# Patient Record
Sex: Male | Born: 1950
Health system: Southern US, Community
[De-identification: ages and names within clinical notes are randomized; demographics above are authoritative.]

## PROBLEM LIST (undated history)

## (undated) DIAGNOSIS — J810 Acute pulmonary edema: Secondary | ICD-10-CM

## (undated) DIAGNOSIS — I214 Non-ST elevation (NSTEMI) myocardial infarction: Principal | ICD-10-CM

## (undated) DIAGNOSIS — I2510 Atherosclerotic heart disease of native coronary artery without angina pectoris: Secondary | ICD-10-CM

## (undated) DIAGNOSIS — I5042 Chronic combined systolic (congestive) and diastolic (congestive) heart failure: Secondary | ICD-10-CM

## (undated) DIAGNOSIS — E785 Hyperlipidemia, unspecified: Secondary | ICD-10-CM

## (undated) DIAGNOSIS — I6529 Occlusion and stenosis of unspecified carotid artery: Secondary | ICD-10-CM

## (undated) DIAGNOSIS — I5022 Chronic systolic (congestive) heart failure: Secondary | ICD-10-CM

## (undated) DIAGNOSIS — I499 Cardiac arrhythmia, unspecified: Secondary | ICD-10-CM

## (undated) DIAGNOSIS — I255 Ischemic cardiomyopathy: Secondary | ICD-10-CM

## (undated) DIAGNOSIS — I10 Essential (primary) hypertension: Secondary | ICD-10-CM

## (undated) HISTORY — DX: Occlusion and stenosis of unspecified carotid artery: I65.29

## (undated) HISTORY — DX: Cardiac arrhythmia, unspecified: I49.9

## (undated) HISTORY — PX: CARDIAC CATHETERIZATION: SHX172

---

## 2002-01-12 ENCOUNTER — Encounter: Admission: RE | Admit: 2002-01-12 | Discharge: 2002-04-12 | Payer: Self-pay | Admitting: Internal Medicine

## 2003-01-04 ENCOUNTER — Encounter: Admission: RE | Admit: 2003-01-04 | Discharge: 2003-01-04 | Payer: Self-pay | Admitting: Occupational Medicine

## 2003-02-11 ENCOUNTER — Encounter: Admission: RE | Admit: 2003-02-11 | Discharge: 2003-03-03 | Payer: Self-pay | Admitting: Occupational Medicine

## 2009-02-23 ENCOUNTER — Emergency Department (HOSPITAL_COMMUNITY): Admission: EM | Admit: 2009-02-23 | Discharge: 2009-02-23 | Payer: Self-pay | Admitting: Family Medicine

## 2010-01-21 ENCOUNTER — Inpatient Hospital Stay (HOSPITAL_COMMUNITY)
Admission: EM | Admit: 2010-01-21 | Discharge: 2010-01-25 | Payer: Self-pay | Source: Home / Self Care | Attending: Cardiovascular Disease | Admitting: Cardiovascular Disease

## 2010-02-09 ENCOUNTER — Encounter: Payer: Self-pay | Admitting: Cardiovascular Disease

## 2010-02-10 ENCOUNTER — Ambulatory Visit: Payer: Self-pay | Admitting: Cardiovascular Disease

## 2010-03-07 ENCOUNTER — Ambulatory Visit: Payer: Self-pay | Admitting: Cardiovascular Disease

## 2010-03-07 ENCOUNTER — Encounter: Payer: Self-pay | Admitting: Cardiovascular Disease

## 2010-03-23 NOTE — Progress Notes (Signed)
Summary: [facility]: Progress Note  [facility]: Progress Note   Imported By: Earl Many 03/15/2010 17:54:32  _____________________________________________________________________  External Attachment:    Type:   Image     Comment:   External Document

## 2010-03-29 NOTE — Letter (Signed)
Summary: [facility] Progress Note   [facility] Progress Note   Imported By: Roderic Ovens 03/23/2010 12:02:37  _____________________________________________________________________  External Attachment:    Type:   Image     Comment:   External Document

## 2010-04-17 LAB — POCT CARDIAC MARKERS
CKMB, poc: 6.7 ng/mL (ref 1.0–8.0)
Myoglobin, poc: 118 ng/mL (ref 12–200)
Troponin i, poc: 2.45 ng/mL (ref 0.00–0.09)

## 2010-04-17 LAB — BASIC METABOLIC PANEL
BUN: 16 mg/dL (ref 6–23)
CO2: 26 mEq/L (ref 19–32)
Calcium: 8.4 mg/dL (ref 8.4–10.5)
Chloride: 103 mEq/L (ref 96–112)
Creatinine, Ser: 0.89 mg/dL (ref 0.4–1.5)
GFR calc Af Amer: >60 mL/min (ref >60)
GFR calc non Af Amer: >60 mL/min (ref >60)
Glucose, Bld: 239 mg/dL — ABNORMAL HIGH (ref 70–99)
Potassium: 4.2 mEq/L (ref 3.5–5.1)
Sodium: 135 mEq/L (ref 135–145)

## 2010-04-17 LAB — LIPID PANEL
Cholesterol: 183 mg/dL (ref 0–200)
HDL: 32 mg/dL — ABNORMAL LOW (ref >39)
LDL Cholesterol: 137 mg/dL — ABNORMAL HIGH (ref 0–99)
Total CHOL/HDL Ratio: 5.7 RATIO
Triglycerides: 68 mg/dL (ref <150)
VLDL: 14 mg/dL (ref 0–40)

## 2010-04-17 LAB — COMPREHENSIVE METABOLIC PANEL
ALT: 21 U/L (ref 0–53)
AST: 25 U/L (ref 0–37)
Albumin: 3.5 g/dL (ref 3.5–5.2)
Alkaline Phosphatase: 68 U/L (ref 39–117)
BUN: 20 mg/dL (ref 6–23)
CO2: 24 mEq/L (ref 19–32)
Calcium: 9.4 mg/dL (ref 8.4–10.5)
Chloride: 102 mEq/L (ref 96–112)
Creatinine, Ser: 0.95 mg/dL (ref 0.4–1.5)
GFR calc Af Amer: >60 mL/min (ref >60)
GFR calc non Af Amer: >60 mL/min (ref >60)
Glucose, Bld: 352 mg/dL — ABNORMAL HIGH (ref 70–99)
Potassium: 4.1 mEq/L (ref 3.5–5.1)
Sodium: 136 mEq/L (ref 135–145)
Total Bilirubin: 0.4 mg/dL (ref 0.3–1.2)
Total Protein: 6.7 g/dL (ref 6.0–8.3)

## 2010-04-17 LAB — DIFFERENTIAL
Basophils Absolute: 0 10*3/uL (ref 0.0–0.1)
Basophils Relative: 0 % (ref 0–1)
Eosinophils Absolute: 0.2 10*3/uL (ref 0.0–0.7)
Eosinophils Relative: 3 % (ref 0–5)
Lymphocytes Relative: 21 % (ref 12–46)
Lymphs Abs: 1.4 10*3/uL (ref 0.7–4.0)
Monocytes Absolute: 0.4 10*3/uL (ref 0.1–1.0)
Monocytes Relative: 5 % (ref 3–12)
Neutro Abs: 4.9 10*3/uL (ref 1.7–7.7)
Neutrophils Relative %: 71 % (ref 43–77)

## 2010-04-17 LAB — URINALYSIS, ROUTINE W REFLEX MICROSCOPIC
Bilirubin Urine: NEGATIVE
Glucose, UA: >1000 mg/dL — AB
Hgb urine dipstick: NEGATIVE
Ketones, ur: NEGATIVE mg/dL
Leukocytes, UA: NEGATIVE
Nitrite: NEGATIVE
Protein, ur: NEGATIVE mg/dL
Specific Gravity, Urine: 1.037 — ABNORMAL HIGH (ref 1.005–1.030)
Urobilinogen, UA: 0.2 mg/dL (ref 0.0–1.0)
pH: 6.5 (ref 5.0–8.0)

## 2010-04-17 LAB — GLUCOSE, CAPILLARY
Glucose-Capillary: 128 mg/dL — ABNORMAL HIGH (ref 70–99)
Glucose-Capillary: 151 mg/dL — ABNORMAL HIGH (ref 70–99)
Glucose-Capillary: 179 mg/dL — ABNORMAL HIGH (ref 70–99)
Glucose-Capillary: 182 mg/dL — ABNORMAL HIGH (ref 70–99)
Glucose-Capillary: 187 mg/dL — ABNORMAL HIGH (ref 70–99)
Glucose-Capillary: 195 mg/dL — ABNORMAL HIGH (ref 70–99)
Glucose-Capillary: 269 mg/dL — ABNORMAL HIGH (ref 70–99)
Glucose-Capillary: 397 mg/dL — ABNORMAL HIGH (ref 70–99)

## 2010-04-17 LAB — CBC
HCT: 40 % (ref 39.0–52.0)
HCT: 40.5 % (ref 39.0–52.0)
Hemoglobin: 13.4 g/dL (ref 13.0–17.0)
Hemoglobin: 14.2 g/dL (ref 13.0–17.0)
MCH: 30.5 pg (ref 26.0–34.0)
MCH: 31.6 pg (ref 26.0–34.0)
MCHC: 33.5 g/dL (ref 30.0–36.0)
MCHC: 35.1 g/dL (ref 30.0–36.0)
MCV: 90.2 fL (ref 78.0–100.0)
MCV: 90.9 fL (ref 78.0–100.0)
Platelets: 175 10*3/uL (ref 150–400)
Platelets: 190 10*3/uL (ref 150–400)
RBC: 4.4 MIL/uL (ref 4.22–5.81)
RBC: 4.49 MIL/uL (ref 4.22–5.81)
RDW: 12.5 % (ref 11.5–15.5)
RDW: 12.5 % (ref 11.5–15.5)
WBC: 6.9 10*3/uL (ref 4.0–10.5)
WBC: 8.1 10*3/uL (ref 4.0–10.5)

## 2010-04-17 LAB — URINE MICROSCOPIC-ADD ON: Urine-Other: NONE SEEN

## 2010-04-17 LAB — TSH: TSH: 3.058 u[IU]/mL (ref 0.350–4.500)

## 2010-04-17 LAB — URINE CULTURE
Colony Count: NO GROWTH
Culture  Setup Time: 201112171834
Culture: NO GROWTH

## 2010-04-17 LAB — PROTIME-INR
INR: 0.93 (ref 0.00–1.49)
Prothrombin Time: 12.7 seconds (ref 11.6–15.2)

## 2010-04-17 LAB — CARDIAC PANEL(CRET KIN+CKTOT+MB+TROPI)
CK, MB: 17.1 ng/mL (ref 0.3–4.0)
CK, MB: 24.1 ng/mL (ref 0.3–4.0)
Relative Index: 5.5 — ABNORMAL HIGH (ref 0.0–2.5)
Relative Index: 5.9 — ABNORMAL HIGH (ref 0.0–2.5)
Total CK: 312 U/L — ABNORMAL HIGH (ref 7–232)
Total CK: 407 U/L — ABNORMAL HIGH (ref 7–232)
Troponin I: 12.43 ng/mL (ref 0.00–0.06)
Troponin I: 15.69 ng/mL (ref 0.00–0.06)

## 2010-04-17 LAB — APTT: aPTT: 30 seconds (ref 24–37)

## 2010-04-17 LAB — TROPONIN I: Troponin I: 3.19 ng/mL (ref 0.00–0.06)

## 2010-04-17 LAB — CK TOTAL AND CKMB (NOT AT ARMC)
CK, MB: 7.6 ng/mL (ref 0.3–4.0)
Relative Index: 4.5 — ABNORMAL HIGH (ref 0.0–2.5)
Total CK: 169 U/L (ref 7–232)

## 2010-04-17 LAB — LIPASE, BLOOD: Lipase: 28 U/L (ref 11–59)

## 2010-04-17 LAB — MRSA PCR SCREENING: MRSA by PCR: NEGATIVE

## 2010-04-19 ENCOUNTER — Other Ambulatory Visit (INDEPENDENT_AMBULATORY_CARE_PROVIDER_SITE_OTHER): Payer: 59

## 2010-04-19 DIAGNOSIS — E785 Hyperlipidemia, unspecified: Secondary | ICD-10-CM

## 2010-04-21 ENCOUNTER — Observation Stay (HOSPITAL_COMMUNITY)
Admission: RE | Admit: 2010-04-21 | Discharge: 2010-04-22 | Disposition: A | Discharge status: Home / Self Care | Payer: 59 | Source: Ambulatory Visit | Attending: Cardiovascular Disease | Admitting: Cardiovascular Disease

## 2010-04-21 DIAGNOSIS — E785 Hyperlipidemia, unspecified: Secondary | ICD-10-CM | POA: Insufficient documentation

## 2010-04-21 DIAGNOSIS — I2510 Atherosclerotic heart disease of native coronary artery without angina pectoris: Secondary | ICD-10-CM

## 2010-04-21 DIAGNOSIS — F17200 Nicotine dependence, unspecified, uncomplicated: Secondary | ICD-10-CM | POA: Insufficient documentation

## 2010-04-21 DIAGNOSIS — I498 Other specified cardiac arrhythmias: Secondary | ICD-10-CM | POA: Insufficient documentation

## 2010-04-21 DIAGNOSIS — I209 Angina pectoris, unspecified: Secondary | ICD-10-CM | POA: Insufficient documentation

## 2010-04-21 DIAGNOSIS — I252 Old myocardial infarction: Secondary | ICD-10-CM | POA: Insufficient documentation

## 2010-04-21 DIAGNOSIS — E119 Type 2 diabetes mellitus without complications: Secondary | ICD-10-CM | POA: Insufficient documentation

## 2010-04-21 LAB — GLUCOSE, CAPILLARY: Glucose-Capillary: 315 mg/dL — ABNORMAL HIGH (ref 70–99)

## 2010-04-22 DIAGNOSIS — R079 Chest pain, unspecified: Secondary | ICD-10-CM

## 2010-04-22 LAB — CBC
MCH: 29.8 pg (ref 26.0–34.0)
MCHC: 33 g/dL (ref 30.0–36.0)
MCV: 90.4 fL (ref 78.0–100.0)
Platelets: 194 10*3/uL (ref 150–400)
RDW: 12.8 % (ref 11.5–15.5)
WBC: 6.4 10*3/uL (ref 4.0–10.5)

## 2010-04-22 LAB — BASIC METABOLIC PANEL
BUN: 17 mg/dL (ref 6–23)
Chloride: 107 mEq/L (ref 96–112)
Creatinine, Ser: 0.91 mg/dL (ref 0.4–1.5)
Glucose, Bld: 254 mg/dL — ABNORMAL HIGH (ref 70–99)

## 2010-04-23 NOTE — Discharge Summary (Addendum)
NAMEWEN, MERCED                  ACCOUNT NO.:  [id number]  MEDICAL RECORD NO.:  [id number]           PATIENT TYPE:  O  LOCATION:  6526                         FACILITY:  [facility]  PHYSICIAN:  Vesta Mixer, M.D. DATE OF BIRTH:  [birth year]  DATE OF ADMISSION:  04/21/2010 DATE OF DISCHARGE:  04/22/2010                              DISCHARGE SUMMARY   DISCHARGE DIAGNOSES: 1. Persistent chest pain, status post right coronary artery stenting     with overlapping long drug-eluting stent placed on April 21, 2010     (initially treated medically from December 2011 cath, failed     medical therapy). 2. Coronary artery disease initially diagnosed by catheterization,     January 21, 2010, in the setting of non-ST elevation myocardial     infarction.     a.     Status post percutaneous coronary intervention using Promus      drug-eluting stent placement to the left circumflex, January 21, 2010, patent by cath April 21, 2010.     b.     Right coronary artery stenosis diagnosed that admission,      initially from medical therapy with above intervention this      admission.     c.     Ejection fraction of 50% by cath, December of 17, 2011. 3. Diabetes mellitus, for followup with primary care physician. 4. Dyslipidemia. 5. Tobacco abuse. 6. Sinus bradycardia, previous documented sinus pauses, asymptomatic.  HOSPITAL COURSE:  Mr. Arlotta is a 60 year old gentleman with the past medical history that includes diabetes, tobacco abuse, dyslipidemia, and newly diagnosed coronary artery disease by cath in December 2011.  He had a PCI with drug-eluting stent placement to the left circumflex at time and had severe RCA stenosis involving diffusely diseased vessels left-to-right collaterals.  Initially, medical management was pursued. As the vessel was not felt favorable for PCI, but could be considered given any refractory angina.  The patient presented to Dr. Harvie Bridge office with continued  complaints of ongoing chest pain, exertional at times, others signs of indigestion, and mild tightness when he walks out of the car.  Because of these recurrent symptoms, he was referred for repeat cardiac catheterization which was done on April 21, 2010, by Dr. Excell Seltzer.  He ultimately had successful stenting to RCA with overlapping long drug-eluting stents.  There was negative pressure wire analysis of the LAD as well.  The patient did well postprocedurally.  He was seen and examined by Dr. Elease Hashimoto and will be discharged in stable condition to home.  DISCHARGE LABS:  WBC 6.4, hemoglobin 13.1, hematocrit 39.7, platelet count 194.  Sodium 136, potassium 4.3, chloride 107, CO2 of 25, glucose 254, BUN 17, creatinine 0.91.  STUDIES:  Cardiac catheterization on April 21, 2010, please see full report for details as well as HPI for summary.  DISCHARGE MEDICATIONS: 1. Aspirin 81 mg daily. 2. Crestor 20 mg at bedtime. 3. Metformin 1000 mg b.i.d. with instructions not to restart until     April 24, 2010. 4. Metoprolol 25 mg b.i.d. 5. Nitroglycerin sublingual 0.4 mg every  5 minutes as needed up to 3     doses. 6. Effient 10 mg daily. 7. Zantac 75 mg b.i.d. p.r.n. for acid reflux.  DISPOSITION:  Mr. Sindelar will be discharged in stable condition to home. He is instructed not to lift anything or participate in sexual activity for 1 week.  He is not to drive 2 days.  Follow a heart-healthy diabetic diet.  If he notices any pain, swelling, bleeding, or pus at the cath site, he is to call or return.  He will follow up by Dr. Elease Hashimoto in approximately 2 weeks in our office.  We will call him with this appointment.  DURATION OF DISCHARGE ENCOUNTER:  Greater than 30 minutes including physician and PA time.     Ronie Spies, P.A.C.   ______________________________ Vesta Mixer, M.D.    DD/MEDQ  D:  04/22/2010  T:  04/22/2010  Job:  [id number]  Electronically Signed by Ronie Spies  on 04/23/2010  01:36:21 PM Electronically Signed by Kristeen Miss M.D. on 05/16/2010 09:07:00 AM

## 2010-04-24 LAB — GLUCOSE, CAPILLARY: Glucose-Capillary: 266 mg/dL — ABNORMAL HIGH (ref 70–99)

## 2010-04-25 NOTE — Procedures (Signed)
NAMEALMA, Colin [name]                  ACCOUNT NO.:  [id number]  MEDICAL RECORD NO.:  [id number]           PATIENT TYPE:  O  LOCATION:  6526                         FACILITY:  [facility]  PHYSICIAN:  Veverly Fells. Excell Seltzer, MD  DATE OF BIRTH:  08-31-1950  DATE OF PROCEDURE:  04/21/2010 DATE OF DISCHARGE:                           CARDIAC CATHETERIZATION   PROCEDURES: 1. Selective coronary angiography. 2. PTCA and stenting right coronary artery. 3. Pressure wire analysis of the LAD. 4. Perclose of the right femoral artery.  PROCEDURAL INDICATION:  Mr. Girardin is a 60 year old gentleman who presented with a non-ST-elevation infarction a few months back.  He has had refractory angina.  He had a subtotal right coronary artery that was diffusely diseased and was not very favorable for PCI initially.  He did undergo PCI of the left circumflex.  The patient has continued to have angina worse in cold weather and after discussion with Dr. Elease Hashimoto, we elected to bring him back for repeat angiography and probable PCI in the right coronary artery.  Risks and indications of procedure reviewed with the patient.  Informed consent was obtained.  A 6-French sheath was placed in right femoral artery.  A 6-French JL-4 diagnostic catheter was inserted and angiography.  The left coronary artery was performed.  A 6-French JR-4 guide catheter was then inserted and angiography.  The right coronary artery was performed.  This demonstrated subtotal occlusion of the mid- right coronary artery with TIMI-2 flow.  There really was not much left- to-right collateral identified.  We elected to proceed with PCI. Bivalve ring was started for anticoagulation.  A Cougar wire was used to cross the lesion and it was crossed without difficulty.  The vessel was predilated with a 2.5 x 15-mm Apex balloon.  After reopening the vessel, there clearly was severe diffuse stenosis and thought we had to treat the entire distal right coronary  artery which had long diffuse severe stenosis.  A 2.5 x 33 mm Xience Prime stent was carefully positioned over the distal RCA and was deployed to 12 atmospheres the stent appeared well expanded.  A 3.0 x 38 mm Xience Prime drug-eluting stent was then deployed in overlapping fashion over the proximal and mid RCA. It was deployed at 14 atmospheres.  Both stents appeared well expanded. The distal stent was postdilated with a 2.75 x 20-mm [place] Quantum Apex balloon which was dilated to a maximum pressure of 18 atmospheres over the distal and mid stented segment.  A 3.25 x 20-mm  TREK balloon was used over the mid and proximal stent and it was taken to 18 atmospheres on three inflations.  There was an excellent angiographic result with TIMI-3 flow and 0% residual stenosis throughout the RCA.  Attention was then turned to the LAD.  There was moderate stenosis of the proximal LAD just beyond the first diagonal branch.  I decided to do pressure wire analysis of this lesion.  The pressure wire was advanced easily into the mid-LAD beyond the area of stenosis.  There was no resting gradient present.  Intravenous adenosine was then administered and the FFR at  peak hyperemia was 0.92.  The recorded FFR was 0.95, but we saw drift as low as 0.92.  The patient tolerated the procedure well.  There were no immediate complications.  A Perclose device was used for femoral hemostasis.  Coronary findings:  The left main is patent, divides into the LAD and left circumflex.  LAD.  The LAD is patent throughout its course.  It supplies a moderate first diagonal with no stenosis.  Just after the first diagonal, there was a 60-70% stenosis present.  Further down in the mid and distal LAD, there is no significant stenosis.  Left circumflex is patent.  There is a widely patent stent in the mid circumflex.  There are three OM branches all of which are patent.  The first OM arises from the midportion of the stented  segment and it has ostial narrowing related to the stent, but it fills normally.  Right coronary artery.  The right coronary artery has severe stenosis in the midportion.  There is moderate diffuse proximal stenosis.  The midportion is subtotally occluded with a filling defect concerning for thrombus and ruptured plaque.  There is long segment diffuse stenosis throughout the mid and distal vessel.  The PDA and PL branches filled with TIMI 2 flow and are not well filled.  FINAL CONCLUSIONS: 1. Successful stenting of the right coronary artery with overlapping     long drug-eluting stents. 2. Negative pressure wire analysis of the LAD.  RECOMMENDATIONS:  The patient should continue on long-term dual antiplatelet therapy with aspirin and Effient.  He will be followed by Dr. Elease Hashimoto.     Veverly Fells. Excell Seltzer, MD     MDC/MEDQ  D:  04/21/2010  T:  04/22/2010  Job:  [id number]  cc:   Vesta Mixer, M.D.  Electronically Signed by Tonny Bollman MD on 04/25/2010 06:03:50 PM

## 2010-04-26 ENCOUNTER — Ambulatory Visit (INDEPENDENT_AMBULATORY_CARE_PROVIDER_SITE_OTHER): Payer: 59 | Admitting: Cardiovascular Disease

## 2010-04-26 ENCOUNTER — Encounter: Payer: Self-pay | Admitting: *Deleted

## 2010-04-26 ENCOUNTER — Encounter: Payer: Self-pay | Admitting: Cardiovascular Disease

## 2010-04-26 ENCOUNTER — Ambulatory Visit (INDEPENDENT_AMBULATORY_CARE_PROVIDER_SITE_OTHER): Payer: 59 | Admitting: *Deleted

## 2010-04-26 VITALS — BP 142/80 | HR 72 | Wt 153.0 lb

## 2010-04-26 DIAGNOSIS — E785 Hyperlipidemia, unspecified: Secondary | ICD-10-CM

## 2010-04-26 DIAGNOSIS — E119 Type 2 diabetes mellitus without complications: Secondary | ICD-10-CM

## 2010-04-26 DIAGNOSIS — E1165 Type 2 diabetes mellitus with hyperglycemia: Secondary | ICD-10-CM | POA: Insufficient documentation

## 2010-04-26 DIAGNOSIS — I2510 Atherosclerotic heart disease of native coronary artery without angina pectoris: Secondary | ICD-10-CM | POA: Insufficient documentation

## 2010-04-26 DIAGNOSIS — E1169 Type 2 diabetes mellitus with other specified complication: Secondary | ICD-10-CM | POA: Insufficient documentation

## 2010-04-26 DIAGNOSIS — I25110 Atherosclerotic heart disease of native coronary artery with unstable angina pectoris: Secondary | ICD-10-CM | POA: Insufficient documentation

## 2010-04-26 NOTE — Assessment & Plan Note (Addendum)
Colin [name] seems to be doing quite a bit better. He has episodes of chest pain but these are clearly noncardiac. These episodes only last 1-2 seconds. I'm pleased is not having any further episodes of angina. We will continue with his same medications.I'll see him back in 3 months for followup visit. I've asked him to call me sooner if needed.  Have talked to be HR director at his work. He is to return to work on May 01, 2010.

## 2010-04-26 NOTE — Assessment & Plan Note (Signed)
His diabetes appears to be stable. He will follow up with Dr. Sharl Ma

## 2010-04-26 NOTE — Assessment & Plan Note (Signed)
His diabetes has been stable. This is been managed by Dr.Kerr.

## 2010-04-26 NOTE — Assessment & Plan Note (Signed)
He will continue on the Crestor. We'll check a lipid profile and complete metabolic profile at his next visit in 3 months.

## 2010-04-26 NOTE — Progress Notes (Deleted)
Subjective: Colin [name] presents for follow up Of his chest pain. 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 He had PTCA and stenting of the left circumflex artery at that time. His right coronary artery was subtotally occluded it was thought not to be a good candidate for PCI.  He continued have chest pain and was admitted last week for PCI the right coronary artery. He underwent successful PTCA and stenting of his proximal and mid right coronary artery with good results.  He has continued have episodes of chest pain but these are rather atypical. He complains of very quick chest pains that last only one or 2 seconds. They're not associated with exertion. He has not been back to work. He denies any syncope or presyncope. He denies any PND or orthopnea.  Current Outpatient Prescriptions  Medication Sig Dispense Refill  . aspirin 81 MG tablet Take 81 mg by mouth daily.        . isosorbide mononitrate (IMDUR) 30 MG 24 hr tablet Take 30 mg by mouth daily.        . metoprolol succinate (TOPROL-XL) 25 MG 24 hr tablet Take 25 mg by mouth 2 (two) times daily.        . prasugrel (EFFIENT) 10 MG TABS Take 10 mg by mouth daily.        . ranitidine (ZANTAC) 150 MG tablet Take 150 mg by mouth as needed.        . rosuvastatin (CRESTOR) 20 MG tablet Take 20 mg by mouth daily.          No Known Allergies  There is no problem list on file for this patient.   History  Smoking status  . Current Everyday Smoker -- 0.2 packs/day  . Types: Cigarettes  Smokeless tobacco  . Not on file    History  Alcohol Use No    History reviewed. No pertinent family history.  Review of Systems: The patient denies any heat or cold intolerance.  No weight gain or weight loss.  The patient denies headaches or blurry vision.  There is no cough or sputum production.  The patient denies dizziness.  There is no hematuria or hematochezia.  The patient denies any muscle aches or  arthritis.  The patient denies any rash.  The patient denies frequent falling or instability.  There is no history of depression or anxiety.  All other systems were reviewed and are negative.   Physical Exam:  Assessment / Plan:

## 2010-04-26 NOTE — Assessment & Plan Note (Signed)
Colin [name] seems to be doing quite well. He has had episodes of chest pain but these are clearly noncardiac. These episodes only last for one to 2 seconds. I'm pleased that he is not having any further episodes of angina. We'll continue with his same medications. I'll see him back in a the office in 3 months. I have asked him to call [facility] if needed. I've talked to the HR director at his workplace. He is to return to work on May 01, 2010.

## 2010-04-26 NOTE — Progress Notes (Signed)
Subjective:  Mr. Costilla presents for follow up Of his chest pain. 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 He had PTCA and stenting of the left circumflex artery at that time. His right coronary artery was subtotally occluded it was thought not to be a good candidate for PCI. He continued have chest pain and was admitted last week for PCI the right coronary artery. He underwent successful PTCA and stenting of his proximal and mid right coronary artery with good results.  He has continued have episodes of chest pain but these are rather atypical. He complains of very quick chest pains that last only one or 2 seconds. They're not associated with exertion. He has not been back to work. He denies any syncope or presyncope. He denies any PND or orthopnea.   Current Outpatient Prescriptions Medication Sig Dispense Refill . aspirin 81 MG tablet Take 81 mg by mouth daily.       . isosorbide mononitrate (IMDUR) 30 MG 24 hr tablet Take 30 mg by mouth daily.       . metoprolol succinate (TOPROL-XL) 25 MG 24 hr tablet Take 25 mg by mouth 2 (two) times daily.       . prasugrel (EFFIENT) 10 MG TABS Take 10 mg by mouth daily.       . ranitidine (ZANTAC) 150 MG tablet Take 150 mg by mouth as needed.       . rosuvastatin (CRESTOR) 20 MG tablet Take 20 mg by mouth daily.         No Known Allergies  Patient Active Problem List Diagnoses . Coronary artery disease . Diabetes mellitus . Hyperlipidemia   History Smoking status . Current Everyday Smoker -- 0.2 packs/day . Types: Cigarettes Smokeless tobacco . Not on file   History Alcohol Use No   History reviewed. No pertinent family history.  Review of Systems: The patient denies any heat or cold intolerance.  No weight gain or weight loss.  The patient denies headaches or blurry vision.  There is no cough or sputum production.  The patient denies dizziness.  There is no hematuria or hematochezia.  The  patient denies any muscle aches or arthritis.  The patient denies any rash.  The patient denies frequent falling or instability.  There is no history of depression or anxiety.  All other systems were reviewed and are negative.   Physical Exam: The patient is alert and oriented x 3.  The mood and affect are normal.  The skin is warm and dry.  Color is normal.  The HEENT exam reveals that the sclera are nonicteric.  The mucous membranes are moist.  The carotids are 2+ without bruits.  There is no thyromegaly.  There is no JVD.  The lungs are clear.  The chest wall is non tender.  The heart exam reveals a regular rate with a normal S1 and S2.  There are no murmurs, gallops, or rubs.  The PMI is not displaced.   Abdominal exam reveals good bowel sounds.  There is no guarding or rebound.  There is no hepatosplenomegaly or tenderness.  There are no masses.  Exam of the legs reveal no clubbing, cyanosis, or edema.  The legs are without rashes.  The distal pulses are intact.  Cranial nerves II - XII are intact.  Motor and sensory functions are intact.  The gait is normal.  Assessment / Plan:

## 2010-04-26 NOTE — Assessment & Plan Note (Signed)
He will continue with the same dose of Crestor. We will check a fasting lipid profile and CMET at his next office visit in 3 months.

## 2010-04-26 NOTE — Progress Notes (Signed)
Subjective:  [name] presents for follow up Of his chest pain. 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 He had PTCA and stenting of the left circumflex artery at that time. His right coronary artery was subtotally occluded it was thought not to be a good candidate for PCI. He continued have chest pain and was admitted last week for PCI the right coronary artery. He underwent successful PTCA and stenting of his proximal and mid right coronary artery with good results.  He has continued have episodes of chest pain but these are rather atypical. He complains of very quick chest pains that last only one or 2 seconds. They're not associated with exertion. He has not been back to work. He denies any syncope or presyncope. He denies any PND or orthopnea.   Current Outpatient Prescriptions Medication Sig Dispense Refill . aspirin 81 MG tablet Take 81 mg by mouth daily.       . isosorbide mononitrate (IMDUR) 30 MG 24 hr tablet Take 30 mg by mouth daily.       . metoprolol succinate (TOPROL-XL) 25 MG 24 hr tablet Take 25 mg by mouth 2 (two) times daily.       . prasugrel (EFFIENT) 10 MG TABS Take 10 mg by mouth daily.       . ranitidine (ZANTAC) 150 MG tablet Take 150 mg by mouth as needed.       . rosuvastatin (CRESTOR) 20 MG tablet Take 20 mg by mouth daily.         No Known Allergies  Patient Active Problem List Diagnoses . Coronary artery disease . Diabetes mellitus . Hyperlipidemia   History Smoking status . Current Everyday Smoker -- 0.2 packs/day . Types: Cigarettes Smokeless tobacco . Not on file   History Alcohol Use No   History reviewed. No pertinent family history.  Review of Systems: The patient denies any heat or cold intolerance.  No weight gain or weight loss.  The patient denies headaches or blurry vision.  There is no cough or sputum production.  The patient denies dizziness.  There is no hematuria or hematochezia.  The  patient denies any muscle aches or arthritis.  The patient denies any rash.  The patient denies frequent falling or instability.  There is no history of depression or anxiety.  All other systems were reviewed and are negative.   Physical Exam: The patient is alert and oriented x 3.  The mood and affect are normal.  The skin is warm and dry.  Color is normal.  The HEENT exam reveals that the sclera are nonicteric.  The mucous membranes are moist.  The carotids are 2+ without bruits.  There is no thyromegaly.  There is no JVD.  The lungs are clear.  The chest wall is non tender.  The heart exam reveals a regular rate with a normal S1 and S2.  There are no murmurs, gallops, or rubs.  The PMI is not displaced.   Abdominal exam reveals good bowel sounds.  There is no guarding or rebound.  There is no hepatosplenomegaly or tenderness.  There are no masses.  Exam of the legs reveal no clubbing, cyanosis, or edema.  The legs are without rashes.  The distal pulses are intact.  Cranial nerves II - XII are intact.  Motor and sensory functions are intact.  The gait is normal.  Assessment / Plan:     are intact.  The gait is normal.  Assessment / Plan:

## 2010-05-08 ENCOUNTER — Ambulatory Visit: Payer: Self-pay | Admitting: Cardiovascular Disease

## 2010-12-08 ENCOUNTER — Emergency Department (HOSPITAL_COMMUNITY): Payer: 59

## 2010-12-08 ENCOUNTER — Emergency Department (HOSPITAL_COMMUNITY)
Admission: EM | Admit: 2010-12-08 | Discharge: 2010-12-08 | Disposition: A | Discharge status: Home / Self Care | Payer: 59 | Attending: Emergency Medicine | Admitting: Emergency Medicine

## 2010-12-08 DIAGNOSIS — I10 Essential (primary) hypertension: Secondary | ICD-10-CM | POA: Insufficient documentation

## 2010-12-08 DIAGNOSIS — R1011 Right upper quadrant pain: Secondary | ICD-10-CM | POA: Insufficient documentation

## 2010-12-08 DIAGNOSIS — S40019A Contusion of unspecified shoulder, initial encounter: Secondary | ICD-10-CM | POA: Insufficient documentation

## 2010-12-08 DIAGNOSIS — E119 Type 2 diabetes mellitus without complications: Secondary | ICD-10-CM | POA: Insufficient documentation

## 2010-12-08 DIAGNOSIS — S301XXA Contusion of abdominal wall, initial encounter: Secondary | ICD-10-CM | POA: Insufficient documentation

## 2010-12-08 DIAGNOSIS — M25519 Pain in unspecified shoulder: Secondary | ICD-10-CM | POA: Insufficient documentation

## 2010-12-08 LAB — DIFFERENTIAL
Basophils Absolute: 0 10*3/uL (ref 0.0–0.1)
Basophils Relative: 0 % (ref 0–1)
Eosinophils Absolute: 0.2 10*3/uL (ref 0.0–0.7)
Eosinophils Relative: 2 % (ref 0–5)
Lymphocytes Relative: 20 % (ref 12–46)
Lymphs Abs: 1.9 10*3/uL (ref 0.7–4.0)
Monocytes Absolute: 0.6 10*3/uL (ref 0.1–1.0)
Monocytes Relative: 6 % (ref 3–12)

## 2010-12-08 LAB — CBC
Hemoglobin: 15 g/dL (ref 13.0–17.0)
MCH: 31.7 pg (ref 26.0–34.0)
Platelets: 197 10*3/uL (ref 150–400)
RBC: 4.73 MIL/uL (ref 4.22–5.81)
WBC: 9.2 10*3/uL (ref 4.0–10.5)

## 2010-12-08 LAB — BASIC METABOLIC PANEL
CO2: 24 mEq/L (ref 19–32)
Calcium: 9.5 mg/dL (ref 8.4–10.5)
Glucose, Bld: 169 mg/dL — ABNORMAL HIGH (ref 70–99)
Potassium: 4 mEq/L (ref 3.5–5.1)
Sodium: 139 mEq/L (ref 135–145)

## 2010-12-08 MED ORDER — IOHEXOL 300 MG/ML  SOLN
80.0000 mL | Freq: Once | INTRAMUSCULAR | Status: AC | PRN
  Administered 2010-12-08: 80 mL via INTRAVENOUS

## 2012-10-20 ENCOUNTER — Emergency Department (HOSPITAL_COMMUNITY)
Admission: EM | Admit: 2012-10-20 | Discharge: 2012-10-20 | Disposition: A | Discharge status: Home / Self Care | Payer: 59 | Source: Home / Self Care | Attending: Family Medicine | Admitting: Family Medicine

## 2012-10-20 ENCOUNTER — Encounter (HOSPITAL_COMMUNITY): Payer: Self-pay | Admitting: Emergency Medicine

## 2012-10-20 DIAGNOSIS — R609 Edema, unspecified: Secondary | ICD-10-CM

## 2012-10-20 LAB — BASIC METABOLIC PANEL
CO2: 26 mEq/L (ref 19–32)
Calcium: 9.5 mg/dL (ref 8.4–10.5)
GFR calc non Af Amer: >90 mL/min (ref >90)
Glucose, Bld: 122 mg/dL — ABNORMAL HIGH (ref 70–99)
Potassium: 4.2 mEq/L (ref 3.5–5.1)
Sodium: 140 mEq/L (ref 135–145)

## 2012-10-20 LAB — PRO B NATRIURETIC PEPTIDE: Pro B Natriuretic peptide (BNP): 91.1 pg/mL (ref 0–125)

## 2012-10-20 NOTE — ED Provider Notes (Signed)
Colin [name] is a 62 y.o. male who presents to Urgent Care today for bilateral peripheral edema. Patient notes that he has developed peripheral edema when he stands for prolonged period of time. His work shift is to stand for 12 hours at a time. This is been going on for several weeks to months. He denies any trouble breathing or chest pains. He has not seen his primary care provider for this. He mentioned altering his scheduled to his supervisor who informed her that he would need a physician's note for this. His supervisor is okay with modifying his work and his to accommodate him but would does require a doctor's note. He's not taking any medications or use compression stockings for the edema. He denies any shortness of breath chest pain palpitations or exertional symptoms. He feels well otherwise.    Past Medical History  Diagnosis Date  . Diabetes mellitus   . Carotid artery occlusion   . Arrhythmia    coronary artery disease  History  Substance Use Topics  . Smoking status: Current Every Day Smoker -- 0.25 packs/day    Types: Cigarettes  . Smokeless tobacco: Not on file  . Alcohol Use: No   ROS as above Medications reviewed. No current facility-administered medications for this encounter.   Current Outpatient Prescriptions  Medication Sig Dispense Refill  . insulin glargine (LANTUS) 100 UNIT/ML injection Inject 90 Units into the skin at bedtime.      . metFORMIN (GLUMETZA) 1000 MG (MOD) 24 hr tablet Take 1,000 mg by mouth 2 (two) times daily with a meal.      . aspirin 81 MG tablet Take 81 mg by mouth daily.        . isosorbide mononitrate (IMDUR) 30 MG 24 hr tablet Take 30 mg by mouth daily.        . metoprolol succinate (TOPROL-XL) 25 MG 24 hr tablet Take 25 mg by mouth 2 (two) times daily.        . prasugrel (EFFIENT) 10 MG TABS Take 10 mg by mouth daily.        . ranitidine (ZANTAC) 150 MG tablet Take 150 mg by mouth as needed.        . rosuvastatin (CRESTOR) 20 MG tablet Take  20 mg by mouth daily.          Exam:  BP 182/89  Pulse 62  Temp(Src) 97.7 F (36.5 C) (Oral)  Resp 16  SpO2 98% Gen: Well NAD HEENT: EOMI,  MMM Lungs: CTABL Nl WOB Heart: RRR no MRG Abd: NABS, NT, ND Exts: Non edematous BL  LE, warm and well perfused. Pulses intact distally  No results found for this or any previous visit (from the past 24 hour(s)). No results found.  Assessment and Plan: 62 y.o. male with peripheral edema likely due to prolonged standing. Plan to obtain basic metabolic panel and pro-BNP to evaluate for possible causes of edema aside from dependent edema.  His medications may also be causing some symptoms. Effient and isosorbide can cause edema Patient will followup with his primary care provider in 2 weeks. Limited work note provided Discussed warning signs or symptoms. Please see discharge instructions. Patient expresses understanding.       Rodolph Bong, MD 10/20/12 [phone]

## 2012-10-20 NOTE — ED Notes (Signed)
C/o bilateral leg pain because of long work hours which he has to stand up a lot.  Patient wants to get note from doctor so he don't have to work 12 hours a day.  Refill on metformin and lantus

## 2016-08-23 DIAGNOSIS — E1142 Type 2 diabetes mellitus with diabetic polyneuropathy: Secondary | ICD-10-CM | POA: Diagnosis not present

## 2016-08-23 DIAGNOSIS — R899 Unspecified abnormal finding in specimens from other organs, systems and tissues: Secondary | ICD-10-CM | POA: Diagnosis not present

## 2016-08-23 DIAGNOSIS — N182 Chronic kidney disease, stage 2 (mild): Secondary | ICD-10-CM | POA: Diagnosis not present

## 2016-08-23 DIAGNOSIS — Z79899 Other long term (current) drug therapy: Secondary | ICD-10-CM | POA: Diagnosis not present

## 2016-08-23 DIAGNOSIS — Z5181 Encounter for therapeutic drug level monitoring: Secondary | ICD-10-CM | POA: Diagnosis not present

## 2016-08-23 DIAGNOSIS — E1122 Type 2 diabetes mellitus with diabetic chronic kidney disease: Secondary | ICD-10-CM | POA: Diagnosis not present

## 2016-11-26 DIAGNOSIS — Z794 Long term (current) use of insulin: Secondary | ICD-10-CM | POA: Diagnosis not present

## 2016-11-26 DIAGNOSIS — Z5181 Encounter for therapeutic drug level monitoring: Secondary | ICD-10-CM | POA: Diagnosis not present

## 2016-11-26 DIAGNOSIS — R634 Abnormal weight loss: Secondary | ICD-10-CM | POA: Diagnosis not present

## 2016-11-26 DIAGNOSIS — N182 Chronic kidney disease, stage 2 (mild): Secondary | ICD-10-CM | POA: Diagnosis not present

## 2016-11-26 DIAGNOSIS — E1122 Type 2 diabetes mellitus with diabetic chronic kidney disease: Secondary | ICD-10-CM | POA: Diagnosis not present

## 2016-11-26 DIAGNOSIS — E1142 Type 2 diabetes mellitus with diabetic polyneuropathy: Secondary | ICD-10-CM | POA: Diagnosis not present

## 2016-12-26 DIAGNOSIS — E1122 Type 2 diabetes mellitus with diabetic chronic kidney disease: Secondary | ICD-10-CM | POA: Diagnosis not present

## 2016-12-26 DIAGNOSIS — E1142 Type 2 diabetes mellitus with diabetic polyneuropathy: Secondary | ICD-10-CM | POA: Diagnosis not present

## 2016-12-26 DIAGNOSIS — N182 Chronic kidney disease, stage 2 (mild): Secondary | ICD-10-CM | POA: Diagnosis not present

## 2016-12-26 DIAGNOSIS — Z23 Encounter for immunization: Secondary | ICD-10-CM | POA: Diagnosis not present

## 2016-12-26 DIAGNOSIS — Z5181 Encounter for therapeutic drug level monitoring: Secondary | ICD-10-CM | POA: Diagnosis not present

## 2017-01-09 DIAGNOSIS — Z79899 Other long term (current) drug therapy: Secondary | ICD-10-CM | POA: Diagnosis not present

## 2017-01-09 DIAGNOSIS — N182 Chronic kidney disease, stage 2 (mild): Secondary | ICD-10-CM | POA: Diagnosis not present

## 2017-01-09 DIAGNOSIS — E1142 Type 2 diabetes mellitus with diabetic polyneuropathy: Secondary | ICD-10-CM | POA: Diagnosis not present

## 2017-04-19 DIAGNOSIS — Z79899 Other long term (current) drug therapy: Secondary | ICD-10-CM | POA: Diagnosis not present

## 2017-04-19 DIAGNOSIS — N182 Chronic kidney disease, stage 2 (mild): Secondary | ICD-10-CM | POA: Diagnosis not present

## 2017-04-19 DIAGNOSIS — E1122 Type 2 diabetes mellitus with diabetic chronic kidney disease: Secondary | ICD-10-CM | POA: Diagnosis not present

## 2017-04-19 DIAGNOSIS — E1142 Type 2 diabetes mellitus with diabetic polyneuropathy: Secondary | ICD-10-CM | POA: Diagnosis not present

## 2017-07-24 DIAGNOSIS — Z5181 Encounter for therapeutic drug level monitoring: Secondary | ICD-10-CM | POA: Diagnosis not present

## 2017-07-24 DIAGNOSIS — N182 Chronic kidney disease, stage 2 (mild): Secondary | ICD-10-CM | POA: Diagnosis not present

## 2017-07-24 DIAGNOSIS — Z79899 Other long term (current) drug therapy: Secondary | ICD-10-CM | POA: Diagnosis not present

## 2017-07-24 DIAGNOSIS — E1142 Type 2 diabetes mellitus with diabetic polyneuropathy: Secondary | ICD-10-CM | POA: Diagnosis not present

## 2017-07-24 DIAGNOSIS — E1122 Type 2 diabetes mellitus with diabetic chronic kidney disease: Secondary | ICD-10-CM | POA: Diagnosis not present

## 2017-07-24 DIAGNOSIS — Z7984 Long term (current) use of oral hypoglycemic drugs: Secondary | ICD-10-CM | POA: Diagnosis not present

## 2017-10-21 DIAGNOSIS — N182 Chronic kidney disease, stage 2 (mild): Secondary | ICD-10-CM | POA: Diagnosis not present

## 2017-10-21 DIAGNOSIS — Z5181 Encounter for therapeutic drug level monitoring: Secondary | ICD-10-CM | POA: Diagnosis not present

## 2017-10-21 DIAGNOSIS — E1122 Type 2 diabetes mellitus with diabetic chronic kidney disease: Secondary | ICD-10-CM | POA: Diagnosis not present

## 2017-10-21 DIAGNOSIS — E1142 Type 2 diabetes mellitus with diabetic polyneuropathy: Secondary | ICD-10-CM | POA: Diagnosis not present

## 2017-11-10 ENCOUNTER — Ambulatory Visit (HOSPITAL_COMMUNITY)
Admission: EM | Admit: 2017-11-10 | Discharge: 2017-11-10 | Disposition: A | Discharge status: Home / Self Care | Payer: Medicare Other | Attending: Family Medicine | Admitting: Family Medicine

## 2017-11-10 ENCOUNTER — Encounter (HOSPITAL_COMMUNITY): Payer: Self-pay | Admitting: Emergency Medicine

## 2017-11-10 DIAGNOSIS — T2122XA Burn of second degree of abdominal wall, initial encounter: Secondary | ICD-10-CM

## 2017-11-10 DIAGNOSIS — Z23 Encounter for immunization: Secondary | ICD-10-CM | POA: Diagnosis not present

## 2017-11-10 DIAGNOSIS — M542 Cervicalgia: Secondary | ICD-10-CM | POA: Diagnosis not present

## 2017-11-10 DIAGNOSIS — T300 Burn of unspecified body region, unspecified degree: Secondary | ICD-10-CM

## 2017-11-10 DIAGNOSIS — Z603 Acculturation difficulty: Secondary | ICD-10-CM

## 2017-11-10 DIAGNOSIS — T22251A Burn of second degree of right shoulder, initial encounter: Secondary | ICD-10-CM | POA: Diagnosis not present

## 2017-11-10 DIAGNOSIS — Z789 Other specified health status: Secondary | ICD-10-CM

## 2017-11-10 MED ORDER — TETANUS-DIPHTH-ACELL PERTUSSIS 5-2.5-18.5 LF-MCG/0.5 IM SUSP
0.5000 mL | Freq: Once | INTRAMUSCULAR | Status: AC
  Administered 2017-11-10: 0.5 mL via INTRAMUSCULAR

## 2017-11-10 MED ORDER — TETANUS-DIPHTH-ACELL PERTUSSIS 5-2.5-18.5 LF-MCG/0.5 IM SUSP
INTRAMUSCULAR | Status: AC
  Filled 2017-11-10: qty 0.5

## 2017-11-10 MED ORDER — SILVER SULFADIAZINE 1 % EX CREA: 1.0000 "application " | TOPICAL_CREAM | Freq: Two times a day (BID) | CUTANEOUS | 0 refills | Status: DC

## 2017-11-10 MED ORDER — CYCLOBENZAPRINE HCL 10 MG PO TABS: 10.0000 mg | ORAL_TABLET | Freq: Two times a day (BID) | ORAL | 0 refills | Status: DC | PRN

## 2017-11-10 MED ORDER — CEPHALEXIN 500 MG PO CAPS: 500.0000 mg | ORAL_CAPSULE | Freq: Three times a day (TID) | ORAL | 0 refills | Status: AC
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
## 2018-01-27 DIAGNOSIS — E1142 Type 2 diabetes mellitus with diabetic polyneuropathy: Secondary | ICD-10-CM | POA: Diagnosis not present

## 2018-01-27 DIAGNOSIS — Z5181 Encounter for therapeutic drug level monitoring: Secondary | ICD-10-CM | POA: Diagnosis not present

## 2018-01-27 DIAGNOSIS — E119 Type 2 diabetes mellitus without complications: Secondary | ICD-10-CM | POA: Diagnosis not present

## 2018-01-27 DIAGNOSIS — N182 Chronic kidney disease, stage 2 (mild): Secondary | ICD-10-CM | POA: Diagnosis not present

## 2018-01-27 DIAGNOSIS — E1122 Type 2 diabetes mellitus with diabetic chronic kidney disease: Secondary | ICD-10-CM | POA: Diagnosis not present

## 2019-08-12 ENCOUNTER — Other Ambulatory Visit: Payer: Self-pay

## 2019-08-12 ENCOUNTER — Ambulatory Visit (INDEPENDENT_AMBULATORY_CARE_PROVIDER_SITE_OTHER): Payer: Medicare HMO | Admitting: Primary Care

## 2019-08-12 ENCOUNTER — Encounter (INDEPENDENT_AMBULATORY_CARE_PROVIDER_SITE_OTHER): Payer: Self-pay | Admitting: Primary Care

## 2019-08-12 VITALS — BP 176/82 | HR 69 | Temp 97.8°F | Wt 142.0 lb

## 2019-08-12 DIAGNOSIS — E119 Type 2 diabetes mellitus without complications: Secondary | ICD-10-CM

## 2019-08-12 DIAGNOSIS — E782 Mixed hyperlipidemia: Secondary | ICD-10-CM | POA: Diagnosis not present

## 2019-08-12 DIAGNOSIS — E0842 Diabetes mellitus due to underlying condition with diabetic polyneuropathy: Secondary | ICD-10-CM | POA: Diagnosis not present

## 2019-08-12 DIAGNOSIS — I10 Essential (primary) hypertension: Secondary | ICD-10-CM

## 2019-08-12 DIAGNOSIS — Z1211 Encounter for screening for malignant neoplasm of colon: Secondary | ICD-10-CM | POA: Diagnosis not present

## 2019-08-12 DIAGNOSIS — Z1159 Encounter for screening for other viral diseases: Secondary | ICD-10-CM | POA: Diagnosis not present

## 2019-08-12 DIAGNOSIS — Z125 Encounter for screening for malignant neoplasm of prostate: Secondary | ICD-10-CM | POA: Diagnosis not present

## 2019-08-12 DIAGNOSIS — Z7689 Persons encountering health services in other specified circumstances: Secondary | ICD-10-CM

## 2019-08-12 DIAGNOSIS — R350 Frequency of micturition: Secondary | ICD-10-CM | POA: Diagnosis not present

## 2019-08-12 LAB — POCT GLYCOSYLATED HEMOGLOBIN (HGB A1C): Hemoglobin A1C: 13.6 % — AB (ref 4.0–5.6)

## 2019-08-12 MED ORDER — ACCU-CHEK AVIVA CONNECT W/DEVICE KIT: 1.0000 | PACK | Freq: Three times a day (TID) | 0 refills | Status: DC

## 2019-08-12 MED ORDER — LOSARTAN POTASSIUM-HCTZ 100-25 MG PO TABS: 1.0000 | ORAL_TABLET | Freq: Every day | ORAL | 3 refills | Status: DC

## 2019-08-12 MED ORDER — GLUCOSE BLOOD VI STRP: ORAL_STRIP | 12 refills | Status: DC

## 2019-08-12 MED ORDER — JANUMET 50-1000 MG PO TABS: 1.0000 | ORAL_TABLET | Freq: Two times a day (BID) | ORAL | 1 refills | Status: DC

## 2019-08-12 MED ORDER — LANTUS SOLOSTAR 100 UNIT/ML ~~LOC~~ SOPN: 20.0000 [IU] | PEN_INJECTOR | Freq: Every day | SUBCUTANEOUS | 99 refills | Status: DC

## 2019-08-12 MED ORDER — ACCU-CHEK SOFTCLIX LANCETS MISC: 12 refills | Status: DC

## 2019-08-12 MED ORDER — GABAPENTIN 100 MG PO CAPS: 100.0000 mg | ORAL_CAPSULE | Freq: Three times a day (TID) | ORAL | 3 refills | Status: DC

## 2019-08-12 NOTE — Progress Notes (Signed)
New Patient Office Visit  Subjective:  Patient ID: Colin [name], male    DOB: 1950/07/02  Age: 69 y.o. MRN: [id number]  CC:  Chief Complaint  Patient presents with  . New Patient (Initial Visit)  . Leg Pain    and numbness  . dry mouth    HPI Colin [name] is a 69 year old  [place]spe[place] male pesents with his son Colin [name]  who is his interpretor to establish care and the management of co morbidities. HTN, T2D and diabetic nephropathy. He has not seen his endocrinologist Dr Colin [name] since  10/21/2017. Strangely enough has insulin and states taking it. He is purchasing insulin over the counter and injecting self 50 units daily. Denies shortness of breath, headaches, chest pain or lower extremity edema  Past Medical History:  Diagnosis Date  . Arrhythmia   . Carotid artery occlusion   . Diabetes mellitus     Past Surgical History:  Procedure Laterality Date  . CARDIAC CATHETERIZATION     01/2010 ; 04/2010    No family history on file.  Social History   Socioeconomic History  . Marital status: Married    Spouse name: Not on file  . Number of children: Not on file  . Years of education: Not on file  . Highest education level: Not on file  Occupational History  . Not on file  Tobacco Use  . Smoking status: Current Every Day Smoker    Packs/day: 0.25    Types: Cigarettes  . Smokeless tobacco: Never Used  Substance and Sexual Activity  . Alcohol use: No  . Drug use: No  . Sexual activity: Not on file  Other Topics Concern  . Not on file  Social History Narrative  . Not on file   Social Determinants of Health   Financial Resource Strain:   . Difficulty of Paying Living Expenses:   Food Insecurity:   . Worried About Charity fundraiser in the Last Year:   . Arboriculturist in the Last Year:   Transportation Needs:   . Film/video editor (Medical):   Marland Kitchen Lack of Transportation (Non-Medical):   Physical Activity:   . Days of Exercise per Week:   .  Minutes of Exercise per Session:   Stress:   . Feeling of Stress :   Social Connections:   . Frequency of Communication with Friends and Family:   . Frequency of Social Gatherings with Friends and Family:   . Attends Religious Services:   . Active Member of Clubs or Organizations:   . Attends Archivist Meetings:   Marland Kitchen Marital Status:   Intimate Partner Violence:   . Fear of Current or Ex-Partner:   . Emotionally Abused:   Marland Kitchen Physically Abused:   . Sexually Abused:     ROS Review of Systems  Respiratory: Positive for shortness of breath.   Endocrine: Positive for polydipsia and polyuria.  Musculoskeletal:       Muscle spasm both legs   Neurological: Positive for numbness.       Tingling in feet  All other systems reviewed and are negative.   Objective:   Today's Vitals: BP (!) 176/82 (BP Location: Right Arm, Patient Position: Sitting, Cuff Size: Normal)   Pulse 69   Temp 97.8 F (36.6 C) (Oral)   Wt 142 lb (64.4 kg)   SpO2 94%   Physical Exam Trai was seen today for new patient (initial  visit), leg pain and dry mouth.  Diagnoses and all orders for this visit: Type 2 diabetes mellitus without complication, without long-term current use of insulin (HCC) Uncontrolled start on lantus 20 units twice daily and janumet 50/1000. Check BS before meals and keep log.  Your A1C is a measure of your sugar over the past 3 months and is not affected by what you have eaten over the past few days. Diabetes increases your chances of stroke and heart attack over 300 % and is the leading cause of blindness and kidney failure in the [place]. Please make sure you decrease bad carbs like white bread, white rice, potatoes, corn, soft drinks, pasta, cereals, refined sugars, sweet tea, dried fruits, and fruit juice. Good carbs are okay to eat in moderation like sweet potatoes, brown rice, whole grain pasta/bread, most fruit (except dried fruit) and you can eat as many veggies as you  want.   Greater than 6.5 is considered diabetic. Between 6.4 and 5.7 is prediabetic If your A1C is less than 5.7 you are NOT diabetic.  Targets for Glucose Readings: Time of Check Target for patients WITHOUT Diabetes Target for DIABETICS  Before Meals Less than 100  less than 150  Two hours after meals Less than 200  Less than 250   -     HgB A1c 13.6  -     Microalbumin/Creatinine Ratio, Urine -     CMP14+EGFR  Colon cancer screening -     Ambulatory referral to Gastroenterology  Essential hypertension Counseled on blood pressure goal of less than 130/80, low-sodium, DASH diet, medication compliance, 150 minutes of moderate intensity exercise per week. Prescribe losartan/HCTZ 20/25 daily  -     CBC with Differential  Mixed hyperlipidemia Blood work completed today after results determine what statin to prescribe.  Decrease your fatty foods, red meat, cheese, milk and increase fiber like whole grains and veggies.  -     Lipid Panel  Comprehensive diabetic foot examination, type 2 DM, encounter for [facility]) Completed   Encounter to establish care Colin Mire, NP-C will be your  (PCP) she is mastered prepared . She is skilled to diagnosed and treat illness. Also able to answer health concern as well as continuing care of varied medical conditions, not limited by cause, organ system, or diagnosis.      Outpatient Encounter Medications as of 08/12/2019  Medication Sig  . [DISCONTINUED] insulin glargine (LANTUS) 100 UNIT/ML injection Inject 90 Units into the skin at bedtime.  . Accu-Chek Softclix Lancets lancets Use as instructed  . aspirin 81 MG tablet Take 81 mg by mouth daily.   (Patient not taking: Reported on 08/12/2019)  . Blood Glucose Monitoring Suppl (ACCU-CHEK AVIVA CONNECT) w/Device KIT 1 Bag by Does not apply route 3 (three) times daily before meals.  . gabapentin (NEURONTIN) 100 MG capsule Take 1 capsule (100 mg total) by mouth 3 (three) times daily.  Marland Kitchen glucose blood  test strip Use as instructed  . insulin glargine (LANTUS SOLOSTAR) 100 UNIT/ML Solostar Pen Inject 20 Units into the skin daily.  . isosorbide mononitrate (IMDUR) 30 MG 24 hr tablet Take 30 mg by mouth daily.   (Patient not taking: Reported on 08/12/2019)  . losartan-hydrochlorothiazide (HYZAAR) 100-25 MG tablet Take 1 tablet by mouth daily.  . metFORMIN (GLUMETZA) 1000 MG (MOD) 24 hr tablet Take 1,000 mg by mouth 2 (two) times daily with a meal. (Patient not taking: Reported on 08/12/2019)  . metoprolol succinate (TOPROL-XL) 25 MG  24 hr tablet Take 25 mg by mouth 2 (two) times daily.   (Patient not taking: Reported on 08/12/2019)  . prasugrel (EFFIENT) 10 MG TABS Take 10 mg by mouth daily.   (Patient not taking: Reported on 08/12/2019)  . ranitidine (ZANTAC) 150 MG tablet Take 150 mg by mouth as needed.   (Patient not taking: Reported on 08/12/2019)  . rosuvastatin (CRESTOR) 20 MG tablet Take 20 mg by mouth daily.   (Patient not taking: Reported on 08/12/2019)  . sitaGLIPtin-metformin (JANUMET) 50-1000 MG tablet Take 1 tablet by mouth 2 (two) times daily with a meal.  . [DISCONTINUED] cyclobenzaprine (FLEXERIL) 10 MG tablet Take 1 tablet (10 mg total) by mouth 2 (two) times daily as needed for muscle spasms.  . [DISCONTINUED] silver sulfADIAZINE (SILVADENE) 1 % cream Apply 1 application topically 2 (two) times daily. Clean site of wound and apply cream.   No facility-administered encounter medications on file as of 08/12/2019.    Follow-up: Return in about 5 years (around 08/11/2024) for inperson Bp check.Kerin Perna, NP

## 2019-08-12 NOTE — Patient Instructions (Addendum)
Diabetes Mellitus and Nutrition, Adult When you have diabetes (diabetes mellitus), it is very important to have healthy eating habits because your blood sugar (glucose) levels are greatly affected by what you eat and drink. Eating healthy foods in the appropriate amounts, at about the same times every day, can help you:  Control your blood glucose.  Lower your risk of heart disease.  Improve your blood pressure.  Reach or maintain a healthy weight. Every person with diabetes is different, and each person has different needs for a meal plan. Your health care provider may recommend that you work with a diet and nutrition specialist (dietitian) to make a meal plan that is best for you. Your meal plan may vary depending on factors such as:  The calories you need.  The medicines you take.  Your weight.  Your blood glucose, blood pressure, and cholesterol levels.  Your activity level.  Other health conditions you have, such as heart or kidney disease. How do carbohydrates affect me? Carbohydrates, also called carbs, affect your blood glucose level more than any other type of food. Eating carbs naturally raises the amount of glucose in your blood. Carb counting is a method for keeping track of how many carbs you eat. Counting carbs is important to keep your blood glucose at a healthy level, especially if you use insulin or take certain oral diabetes medicines. It is important to know how many carbs you can safely have in each meal. This is different for every person. Your dietitian can help you calculate how many carbs you should have at each meal and for each snack. Foods that contain carbs include:  Bread, cereal, rice, pasta, and crackers.  Potatoes and corn.  Peas, beans, and lentils.  Milk and yogurt.  Fruit and juice.  Desserts, such as cakes, cookies, ice cream, and candy. How does alcohol affect me? Alcohol can cause a sudden decrease in blood glucose (hypoglycemia),  especially if you use insulin or take certain oral diabetes medicines. Hypoglycemia can be a life-threatening condition. Symptoms of hypoglycemia (sleepiness, dizziness, and confusion) are similar to symptoms of having too much alcohol. If your health care provider says that alcohol is safe for you, follow these guidelines:  Limit alcohol intake to no more than 1 drink per day for nonpregnant women and 2 drinks per day for men. One drink equals 12 oz of beer, 5 oz of wine, or 1 oz of hard liquor.  Do not drink on an empty stomach.  Keep yourself hydrated with water, diet soda, or unsweetened iced tea.  Keep in mind that regular soda, juice, and other mixers may contain a lot of sugar and must be counted as carbs. What are tips for following this plan?  Reading food labels  Start by checking the serving size on the "Nutrition Facts" label of packaged foods and drinks. The amount of calories, carbs, fats, and other nutrients listed on the label is based on one serving of the item. Many items contain more than one serving per package.  Check the total grams (g) of carbs in one serving. You can calculate the number of servings of carbs in one serving by dividing the total carbs by 15. For example, if a food has 30 g of total carbs, it would be equal to 2 servings of carbs.  Check the number of grams (g) of saturated and trans fats in one serving. Choose foods that have low or no amount of these fats.  Check the number of   milligrams (mg) of salt (sodium) in one serving. Most people should limit total sodium intake to less than 2,300 mg per day.  Always check the nutrition information of foods labeled as "low-fat" or "nonfat". These foods may be higher in added sugar or refined carbs and should be avoided.  Talk to your dietitian to identify your daily goals for nutrients listed on the label. Shopping  Avoid buying canned, premade, or processed foods. These foods tend to be high in fat, sodium,  and added sugar.  Shop around the outside edge of the grocery store. This includes fresh fruits and vegetables, bulk grains, fresh meats, and fresh dairy. Cooking  Use low-heat cooking methods, such as baking, instead of high-heat cooking methods like deep frying.  Cook using healthy oils, such as olive, canola, or sunflower oil.  Avoid cooking with butter, cream, or high-fat meats. Meal planning  Eat meals and snacks regularly, preferably at the same times every day. Avoid going long periods of time without eating.  Eat foods high in fiber, such as fresh fruits, vegetables, beans, and whole grains. Talk to your dietitian about how many servings of carbs you can eat at each meal.  Eat 4-6 ounces (oz) of lean protein each day, such as lean meat, chicken, fish, eggs, or tofu. One oz of lean protein is equal to: ? 1 oz of meat, chicken, or fish. ? 1 egg. ?  cup of tofu.  Eat some foods each day that contain healthy fats, such as avocado, nuts, seeds, and fish. Lifestyle  Check your blood glucose regularly.  Exercise regularly as told by your health care provider. This may include: ? 150 minutes of moderate-intensity or vigorous-intensity exercise each week. This could be brisk walking, biking, or water aerobics. ? Stretching and doing strength exercises, such as yoga or weightlifting, at least 2 times a week.  Take medicines as told by your health care provider.  Do not use any products that contain nicotine or tobacco, such as cigarettes and e-cigarettes. If you need help quitting, ask your health care provider.  Work with a Veterinary surgeon or diabetes educator to identify strategies to manage stress and any emotional and social challenges. Questions to ask a health care provider  Do I need to meet with a diabetes educator?  Do I need to meet with a dietitian?  What number can I call if I have questions?  When are the best times to check my blood glucose? Where to find more  information:  American Diabetes Association: diabetes.org  Academy of Nutrition and Dietetics: www.eatright.[employer] of Diabetes and Digestive and Kidney Diseases (NIH): CarFlippers.tn Summary  A healthy meal plan will help you control your blood glucose and maintain a healthy lifestyle.  Working with a diet and nutrition specialist (dietitian) can help you make a meal plan that is best for you.  Keep in mind that carbohydrates (carbs) and alcohol have immediate effects on your blood glucose levels. It is important to count carbs and to use alcohol carefully. This information is not intended to replace advice given to you by your health care provide Hypertension, Adult Hypertension is another name for high blood pressure. High blood pressure forces your heart to work harder to pump blood. This can cause problems over time. There are two numbers in a blood pressure reading. There is a top number (systolic) over a bottom number (diastolic). It is best to have a blood pressure that is below 120/80. Healthy choices can help lower  your blood pressure, or you may need medicine to help lower it. What are the causes? The cause of this condition is not known. Some conditions may be related to high blood pressure. What increases the risk?  Smoking.  Having type 2 diabetes mellitus, high cholesterol, or both.  Not getting enough exercise or physical activity.  Being overweight.  Having too much fat, sugar, calories, or salt (sodium) in your diet.  Drinking too much alcohol.  Having long-term (chronic) kidney disease.  Having a family history of high blood pressure.  Age. Risk increases with age.  Race. You may be at higher risk if you are African American.  Gender. Men are at higher risk than women before age 4. After age 53, women are at higher risk than men.  Having obstructive sleep apnea.  Stress. What are the signs or symptoms?  High blood pressure may  not cause symptoms. Very high blood pressure (hypertensive crisis) may cause: ? Headache. ? Feelings of worry or nervousness (anxiety). ? Shortness of breath. ? Nosebleed. ? A feeling of being sick to your stomach (nausea). ? Throwing up (vomiting). ? Changes in how you see. ? Very bad chest pain. ? Seizures. How is this treated?  This condition is treated by making healthy lifestyle changes, such as: ? Eating healthy foods. ? Exercising more. ? Drinking less alcohol.  Your health care provider may prescribe medicine if lifestyle changes are not enough to get your blood pressure under control, and if: ? Your top number is above 130. ? Your bottom number is above 80.  Your personal target blood pressure may vary. Follow these instructions at home: Eating and drinking   If told, follow the DASH eating plan. To follow this plan: ? Fill one half of your plate at each meal with fruits and vegetables. ? Fill one fourth of your plate at each meal with whole grains. Whole grains include whole-wheat pasta, brown rice, and whole-grain bread. ? Eat or drink low-fat dairy products, such as skim milk or low-fat yogurt. ? Fill one fourth of your plate at each meal with low-fat (lean) proteins. Low-fat proteins include fish, chicken without skin, eggs, beans, and tofu. ? Avoid fatty meat, cured and processed meat, or chicken with skin. ? Avoid pre-made or processed food.  Eat less than 1,500 mg of salt each day.  Do not drink alcohol if: ? Your doctor tells you not to drink. ? You are pregnant, may be pregnant, or are planning to become pregnant.  If you drink alcohol: ? Limit how much you use to:  0-1 drink a day for women.  0-2 drinks a day for men. ? Be aware of how much alcohol is in your drink. In the [place] one drink equals one 12 oz bottle of beer (355 mL), one 5 oz glass of wine (148 mL), or one 1 oz glass of hard liquor (44 mL). Lifestyle   Work with your doctor to stay at  a healthy weight or to lose weight. Ask your doctor what the best weight is for you.  Get at least 30 minutes of exercise most days of the week. This may include walking, swimming, or biking.  Get at least 30 minutes of exercise that strengthens your muscles (resistance exercise) at least 3 days a week. This may include lifting weights or doing Pilates.  Do not use any products that contain nicotine or tobacco, such as cigarettes, e-cigarettes, and chewing tobacco. If you need help quitting, ask your  doctor.  Check your blood pressure at home as told by your doctor.  Keep all follow-up visits as told by your doctor. This is important. Medicines  Take over-the-counter and prescription medicines only as told by your doctor. Follow directions carefully.  Do not skip doses of blood pressure medicine. The medicine does not work as well if you skip doses. Skipping doses also puts you at risk for problems.  Ask your doctor about side effects or reactions to medicines that you should watch for. Contact a doctor if you:  Think you are having a reaction to the medicine you are taking.  Have headaches that keep coming back (recurring).  Feel dizzy.  Have swelling in your ankles.  Have trouble with your vision. Get help right away if you:  Get a very bad headache.  Start to feel mixed up (confused).  Feel weak or numb.  Feel faint.  Have very bad pain in your: ? Chest. ? Belly (abdomen).  Throw up more than once.  Have trouble breathing. Summary  Hypertension is another name for high blood pressure.  High blood pressure forces your heart to work harder to pump blood.  For most people, a normal blood pressure is less than 120/80.  Making healthy choices can help lower blood pressure. If your blood pressure does not get lower with healthy choices, you may need to take medicine. This information is not intended to replace advice given to you by your health care provider. Make  sure you discuss any questions you have with your health care provider. Document Revised: 10/02/2017 Document Reviewed: 10/02/2017 Elsevier Patient Education  2020 Elsevier Inc. r. Make sure you discuss any questions you have with your health care provider. Document Revised: 01/04/2017 Document Reviewed: 02/27/2016 Elsevier Patient Education  2020 [employer].

## 2019-08-13 ENCOUNTER — Encounter: Payer: Self-pay | Admitting: Gastroenterology

## 2019-08-13 LAB — CBC WITH DIFFERENTIAL/PLATELET
Basophils Absolute: 0.1 10*3/uL (ref 0.0–0.2)
Basos: 1 %
EOS (ABSOLUTE): 0.3 10*3/uL (ref 0.0–0.4)
Eos: 4 %
Hematocrit: 46.1 % (ref 37.5–51.0)
Hemoglobin: 14.5 g/dL (ref 13.0–17.7)
Immature Grans (Abs): 0 10*3/uL (ref 0.0–0.1)
Immature Granulocytes: 0 %
Lymphocytes Absolute: 1.3 10*3/uL (ref 0.7–3.1)
Lymphs: 18 %
MCH: 30.9 pg (ref 26.6–33.0)
MCHC: 31.5 g/dL (ref 31.5–35.7)
MCV: 98 fL — ABNORMAL HIGH (ref 79–97)
Monocytes Absolute: 0.5 10*3/uL (ref 0.1–0.9)
Monocytes: 7 %
Neutrophils Absolute: 4.8 10*3/uL (ref 1.4–7.0)
Neutrophils: 70 %
Platelets: 216 10*3/uL (ref 150–450)
RBC: 4.69 x10E6/uL (ref 4.14–5.80)
RDW: 12.7 % (ref 11.6–15.4)
WBC: 6.8 10*3/uL (ref 3.4–10.8)

## 2019-08-13 LAB — LIPID PANEL
Chol/HDL Ratio: 8.5 ratio — ABNORMAL HIGH (ref 0.0–5.0)
Cholesterol, Total: 273 mg/dL — ABNORMAL HIGH (ref 100–199)
HDL: 32 mg/dL — ABNORMAL LOW (ref >39)
LDL Chol Calc (NIH): 199 mg/dL — ABNORMAL HIGH (ref 0–99)
Triglycerides: 218 mg/dL — ABNORMAL HIGH (ref 0–149)
VLDL Cholesterol Cal: 42 mg/dL — ABNORMAL HIGH (ref 5–40)

## 2019-08-13 LAB — CMP14+EGFR
ALT: 12 IU/L (ref 0–44)
AST: 10 IU/L (ref 0–40)
Albumin/Globulin Ratio: 1.6 (ref 1.2–2.2)
Albumin: 4 g/dL (ref 3.8–4.8)
Alkaline Phosphatase: 78 IU/L (ref 48–121)
BUN/Creatinine Ratio: 22 (ref 10–24)
BUN: 23 mg/dL (ref 8–27)
Bilirubin Total: <0.2 mg/dL (ref 0.0–1.2)
CO2: 25 mmol/L (ref 20–29)
Calcium: 9.4 mg/dL (ref 8.6–10.2)
Chloride: 98 mmol/L (ref 96–106)
Creatinine, Ser: 1.04 mg/dL (ref 0.76–1.27)
GFR calc Af Amer: 84 mL/min/{1.73_m2} (ref >59)
GFR calc non Af Amer: 73 mL/min/{1.73_m2} (ref >59)
Globulin, Total: 2.5 g/dL (ref 1.5–4.5)
Glucose: 625 mg/dL (ref 65–99)
Potassium: 4.5 mmol/L (ref 3.5–5.2)
Sodium: 135 mmol/L (ref 134–144)
Total Protein: 6.5 g/dL (ref 6.0–8.5)

## 2019-08-13 LAB — SPECIMEN STATUS REPORT

## 2019-08-13 LAB — PSA: Prostate Specific Ag, Serum: 1.9 ng/mL (ref 0.0–4.0)

## 2019-08-13 LAB — MICROALBUMIN / CREATININE URINE RATIO
Creatinine, Urine: 15.9 mg/dL
Microalb/Creat Ratio: 903 mg/g creat — ABNORMAL HIGH (ref 0–29)
Microalbumin, Urine: 143.5 ug/mL

## 2019-08-19 ENCOUNTER — Other Ambulatory Visit (INDEPENDENT_AMBULATORY_CARE_PROVIDER_SITE_OTHER): Payer: Self-pay | Admitting: Primary Care

## 2019-08-19 DIAGNOSIS — E782 Mixed hyperlipidemia: Secondary | ICD-10-CM

## 2019-08-19 MED ORDER — ATORVASTATIN CALCIUM 80 MG PO TABS: 80.0000 mg | ORAL_TABLET | Freq: Every day | ORAL | 3 refills | Status: DC

## 2019-09-01 ENCOUNTER — Other Ambulatory Visit (INDEPENDENT_AMBULATORY_CARE_PROVIDER_SITE_OTHER): Payer: Self-pay | Admitting: Primary Care

## 2019-09-01 MED ORDER — GLUCOSE BLOOD VI STRP: ORAL_STRIP | 12 refills | Status: DC

## 2019-09-15 ENCOUNTER — Telehealth (INDEPENDENT_AMBULATORY_CARE_PROVIDER_SITE_OTHER): Payer: Self-pay

## 2019-09-15 NOTE — Telephone Encounter (Signed)
Call placed to patient with in person language resource interpreter Eliseo Squires. He left voicemail with results for patient. Maryjean Morn, CMA

## 2019-09-15 NOTE — Telephone Encounter (Signed)
-----   Message from Grayce Sessions, NP sent at 08/19/2019  9:55 PM EDT ----- Colin [name] in atorvastatin 80 mg. (Not crestor )

## 2019-09-16 ENCOUNTER — Ambulatory Visit (INDEPENDENT_AMBULATORY_CARE_PROVIDER_SITE_OTHER): Payer: Medicare HMO | Admitting: Primary Care

## 2019-09-16 ENCOUNTER — Other Ambulatory Visit: Payer: Self-pay

## 2019-09-16 ENCOUNTER — Encounter (INDEPENDENT_AMBULATORY_CARE_PROVIDER_SITE_OTHER): Payer: Self-pay | Admitting: Primary Care

## 2019-09-16 ENCOUNTER — Other Ambulatory Visit: Payer: Self-pay | Admitting: Pharmacist

## 2019-09-16 VITALS — BP 156/76 | HR 73 | Temp 98.0°F | Resp 16 | Ht 66.0 in | Wt 135.0 lb

## 2019-09-16 DIAGNOSIS — E782 Mixed hyperlipidemia: Secondary | ICD-10-CM | POA: Diagnosis not present

## 2019-09-16 DIAGNOSIS — Z1211 Encounter for screening for malignant neoplasm of colon: Secondary | ICD-10-CM

## 2019-09-16 DIAGNOSIS — E119 Type 2 diabetes mellitus without complications: Secondary | ICD-10-CM | POA: Diagnosis not present

## 2019-09-16 DIAGNOSIS — I10 Essential (primary) hypertension: Secondary | ICD-10-CM

## 2019-09-16 LAB — POCT URINALYSIS DIP (CLINITEK)
Bilirubin, UA: NEGATIVE
Blood, UA: NEGATIVE
Glucose, UA: >=1000 mg/dL — AB
Ketones, POC UA: NEGATIVE mg/dL
Leukocytes, UA: NEGATIVE
Nitrite, UA: NEGATIVE
POC PROTEIN,UA: 100 — AB
Spec Grav, UA: 1.015 (ref 1.010–1.025)
Urobilinogen, UA: 0.2 E.U./dL
pH, UA: 7 (ref 5.0–8.0)

## 2019-09-16 LAB — GLUCOSE, POCT (MANUAL RESULT ENTRY)

## 2019-09-16 MED ORDER — "TRUE COMFORT PRO INSULIN SYR 32G X 5/16"" 0.5 ML MISC": 1.0000 "pen " | 3 refills | Status: DC

## 2019-09-16 NOTE — Patient Instructions (Addendum)

## 2019-09-16 NOTE — Progress Notes (Signed)
HTN F /U   Pt stated not taking any meds

## 2019-09-16 NOTE — Progress Notes (Signed)
Established Patient Office Visit  Subjective:  Patient ID: Colin [name], male    DOB: 12-May-1950  Age: 69 y.o. MRN: [id number]  CC: No chief complaint on file.   HPI Colin [name] is a 69 year old 69 man presents for management of type 2 diabetes, elevated blood pressure and needing health maintenance.  Patient is not taking any medication discussed complications with diabetes and hypertension.  Also invited clinical pharmacist Lurena Joiner in 2 reemphasized the importance of taking medication as prescribed and he will follow-up for further teaching. Past Medical History:  Diagnosis Date  . Arrhythmia   . Carotid artery occlusion   . Diabetes mellitus     Past Surgical History:  Procedure Laterality Date  . CARDIAC CATHETERIZATION     01/2010 ; 04/2010    History reviewed. No pertinent family history.  Social History   Socioeconomic History  . Marital status: Married    Spouse name: Not on file  . Number of children: Not on file  . Years of education: Not on file  . Highest education level: Not on file  Occupational History  . Not on file  Tobacco Use  . Smoking status: Current Every Day Smoker    Packs/day: 0.25    Types: Cigarettes  . Smokeless tobacco: Never Used  Substance and Sexual Activity  . Alcohol use: No  . Drug use: No  . Sexual activity: Not on file  Other Topics Concern  . Not on file  Social History Narrative  . Not on file   Social Determinants of Health   Financial Resource Strain:   . Difficulty of Paying Living Expenses:   Food Insecurity:   . Worried About Charity fundraiser in the Last Year:   . Arboriculturist in the Last Year:   Transportation Needs:   . Film/video editor (Medical):   Marland Kitchen Lack of Transportation (Non-Medical):   Physical Activity:   . Days of Exercise per Week:   . Minutes of Exercise per Session:   Stress:   . Feeling of Stress :   Social Connections:   . Frequency of Communication with Friends and  Family:   . Frequency of Social Gatherings with Friends and Family:   . Attends Religious Services:   . Active Member of Clubs or Organizations:   . Attends Archivist Meetings:   Marland Kitchen Marital Status:   Intimate Partner Violence:   . Fear of Current or Ex-Partner:   . Emotionally Abused:   Marland Kitchen Physically Abused:   . Sexually Abused:     Outpatient Medications Prior to Visit  Medication Sig Dispense Refill  . Accu-Chek Softclix Lancets lancets Use as instructed 100 each 12  . aspirin 81 MG tablet Take 81 mg by mouth daily.   (Patient not taking: Reported on 08/12/2019)    . atorvastatin (LIPITOR) 80 MG tablet Take 1 tablet (80 mg total) by mouth daily. (Patient not taking: Reported on 09/16/2019) 90 tablet 3  . Blood Glucose Monitoring Suppl (ACCU-CHEK AVIVA CONNECT) w/Device KIT 1 Bag by Does not apply route 3 (three) times daily before meals. (Patient not taking: Reported on 09/16/2019) 1 kit 0  . gabapentin (NEURONTIN) 100 MG capsule Take 1 capsule (100 mg total) by mouth 3 (three) times daily. (Patient not taking: Reported on 09/16/2019) 90 capsule 3  . glucose blood test strip Take blood sugars before meals ( three times daily) (Patient not taking: Reported on 09/16/2019) 150 each  12  . insulin glargine (LANTUS SOLOSTAR) 100 UNIT/ML Solostar Pen Inject 20 Units into the skin daily. (Patient not taking: Reported on 09/16/2019) 5 pen PRN  . losartan-hydrochlorothiazide (HYZAAR) 100-25 MG tablet Take 1 tablet by mouth daily. 90 tablet 3  . ranitidine (ZANTAC) 150 MG tablet Take 150 mg by mouth as needed.   (Patient not taking: Reported on 08/12/2019)    . sitaGLIPtin-metformin (JANUMET) 50-1000 MG tablet Take 1 tablet by mouth 2 (two) times daily with a meal. (Patient not taking: Reported on 09/16/2019) 180 tablet 1  . isosorbide mononitrate (IMDUR) 30 MG 24 hr tablet Take 30 mg by mouth daily.   (Patient not taking: Reported on 08/12/2019)    . metFORMIN (GLUMETZA) 1000 MG (MOD) 24 hr tablet  Take 1,000 mg by mouth 2 (two) times daily with a meal. (Patient not taking: Reported on 08/12/2019)    . metoprolol succinate (TOPROL-XL) 25 MG 24 hr tablet Take 25 mg by mouth 2 (two) times daily.   (Patient not taking: Reported on 08/12/2019)    . prasugrel (EFFIENT) 10 MG TABS Take 10 mg by mouth daily.   (Patient not taking: Reported on 08/12/2019)     No facility-administered medications prior to visit.    No Known Allergies  ROS Review of Systems  Endocrine: Positive for polydipsia, polyphagia and polyuria.  All other systems reviewed and are negative.     Objective:    Physical Exam Vitals reviewed.  HENT:     Head: Normocephalic.     Right Ear: Tympanic membrane normal.     Left Ear: Tympanic membrane normal.     Nose: Nose normal.  Cardiovascular:     Rate and Rhythm: Normal rate and regular rhythm.     Pulses: Normal pulses.     Heart sounds: Normal heart sounds.  Pulmonary:     Effort: Pulmonary effort is normal.     Breath sounds: Normal breath sounds.  Abdominal:     General: Bowel sounds are normal.  Musculoskeletal:        General: Normal range of motion.     Cervical back: Normal range of motion and neck supple.  Skin:    General: Skin is warm.  Neurological:     Mental Status: He is alert and oriented to person, place, and time.  Psychiatric:        Mood and Affect: Mood normal.        Behavior: Behavior normal.        Thought Content: Thought content normal.        Judgment: Judgment normal.     BP (!) 156/76   Pulse 73   Temp 98 F (36.7 C)   Resp 16   Ht '5\' 6"'$  (1.676 m)   Wt 135 lb (61.2 kg)   SpO2 97%   BMI 21.79 kg/m  Wt Readings from Last 3 Encounters:  09/16/19 135 lb (61.2 kg)  08/12/19 142 lb (64.4 kg)  04/26/10 153 lb (69.4 kg)     Health Maintenance Due  Topic Date Due  . Hepatitis C Screening  Never done  . OPHTHALMOLOGY EXAM  Never done  . COVID-19 Vaccine (1) Never done  . COLONOSCOPY  Never done  . PNA vac Low Risk  Adult (1 of 2 - PCV13) Never done  . INFLUENZA VACCINE  09/06/2019    There are no preventive care reminders to display for this patient.  Lab Results  Component Value Date   TSH 3.058  01/22/2010   Lab Results  Component Value Date   WBC 6.8 08/12/2019   HGB 14.5 08/12/2019   HCT 46.1 08/12/2019   MCV 98 (H) 08/12/2019   PLT 216 08/12/2019   Lab Results  Component Value Date   NA 135 08/12/2019   K 4.5 08/12/2019   CO2 25 08/12/2019   GLUCOSE 625 (HH) 08/12/2019   BUN 23 08/12/2019   CREATININE 1.04 08/12/2019   BILITOT <0.2 08/12/2019   ALKPHOS 78 08/12/2019   AST 10 08/12/2019   ALT 12 08/12/2019   PROT 6.5 08/12/2019   ALBUMIN 4.0 08/12/2019   CALCIUM 9.4 08/12/2019   Lab Results  Component Value Date   CHOL 273 (H) 08/12/2019   Lab Results  Component Value Date   HDL 32 (L) 08/12/2019   Lab Results  Component Value Date   LDLCALC 199 (H) 08/12/2019   Lab Results  Component Value Date   TRIG 218 (H) 08/12/2019   Lab Results  Component Value Date   CHOLHDL 8.5 (H) 08/12/2019   Lab Results  Component Value Date   HGBA1C 13.6 (A) 08/12/2019      Assessment & Plan:  Diagnoses and all orders for this visit:  Type 2 diabetes mellitus without complication, without long-term current use of insulin (HCC) -     POCT URINALYSIS DIP (CLINITEK) -     Glucose (CBG) -     Ambulatory referral to Ophthalmology  Mixed hyperlipidemia Abnormal lipid panel elevated cholesterol 273, triglycerides 218, HDL 32, LDL 199 and cholesterol HDL ratio 8.5.  Patient does not take his cholesterol medication discussed risk of heart attack and stroke and modifying diet to decrease fatty foods.  Essential hypertension Blood pressure is not at goal due to the fact patient chooses not to take medication BP today 156/76 discussed risk factors associated with high blood pressure stroke heart attack.  Discussed monitoring sodium intake encouraged exercising  Colon cancer  screening -     Ambulatory referral to Gastroenterology  Other orders -     Discontinue: Insulin Syringe-Needle U-100 (TRUE COMFORT PRO INSULIN SYR) 32G X 5/16" 0.5 ML MISC; 1 pen by Does not apply route 1 day or 1 dose for 1 dose.  Intervention with clinical pharmacist during visit with interpreter HIN to discuss risk of noncompliance with medication.  Scheduled a follow-up appointment with Lurena Joiner to educate the risk of noncompliance with hypertension, hyperlipidemia, and type 2 diabetes  Follow-up: Return in about 3 months (around 12/17/2019), or Luke 18@ 11 am.    Kerin Perna, NP

## 2019-09-17 ENCOUNTER — Other Ambulatory Visit (INDEPENDENT_AMBULATORY_CARE_PROVIDER_SITE_OTHER): Payer: Self-pay | Admitting: Primary Care

## 2019-09-17 MED ORDER — "INSULIN SYRINGE 31G X 5/16"" 0.3 ML MISC": 1.0000 "application " | Freq: Two times a day (BID) | 3 refills | Status: DC

## 2019-09-22 NOTE — Progress Notes (Signed)
S:    PCP: Gwinda Passe, NP  No chief complaint on file.  Patient arrives in good spirits with an in-person translator. Presents for diabetes, hypertension, and hyperlipidemia evaluation, education, and management. Patient was referred and last seen by Primary Care Provider on 09/16/18 in which patient was not taking his DM and BP medications.   PMH: CAD, T2DM, HLD, HTN  Today, patient reports difficulty accessing ALL prescription medications from [facility]. Reports that he went to [place] pharmacy twice with his son who speaks english and was told his medications were not ready. Only medication he is currently taking is Advil for join pain.   Patient is unable to check sugars at home because he does not have a glucometer and testing supplies; it has been two years since he has checked sugars at home due to this. Reports frequent fatigue throughout the day, nocturia (5-6 times a night), dry mouth, and neuropathy at the tips of his fingers and bottom of his foot that sometimes radiates to his calves. Also reports infrequent dizziness and lightheadedness that may occur 1-2 times a week. Denies blurry vision, jitteriness, increased hunger, and sweating. Additionally, has wrist cuff BP monitor to check blood pressure.   Family/Social History: -Tobacco use: Current every day smoker - 0.25 ppd  Insurance coverage/medication affordability: [employer]  Denies medication adherence due to limited medication access Current diabetes medications include: Lantus 20 units daily, Sitagliptin-metformin 50-1000 mg BID Current hypertension medications include: Losartan-HCTZ 100-25 mg daily Current hyperlipidemia medications include: Atorvastatin 80 mg daily  Patient denies symptoms of hypoglycemic events.  Patient reported dietary habits: Eats 2 meals/day (lunch and dinner) -Eats small portions -Rice, vegetables, fish, limits meat, fruit -Drinks water daily, sometimes soda, juice, and  energy drinks when he feels tired   Patient reports nocturia (nighttime urination).  Patient reports neuropathy (nerve pain). Patient denies visual changes.   O:  Fasting POCT glucose: 360   Lab Results  Component Value Date   HGBA1C 13.6 (A) 08/12/2019   There were no vitals filed for this visit.  Lipid Panel     Component Value Date/Time   CHOL 273 (H) 08/12/2019 1631   TRIG 218 (H) 08/12/2019 1631   HDL 32 (L) 08/12/2019 1631   CHOLHDL 8.5 (H) 08/12/2019 1631   CHOLHDL 5.7 01/22/2010 0530   VLDL 14 01/22/2010 0530   LDLCALC 199 (H) 08/12/2019 1631    Home fasting blood sugars: None  2 hour post-meal/random blood sugars: None  Clinical Atherosclerotic Cardiovascular Disease (ASCVD): No  The 10-year ASCVD risk score Denman George DC Jr., et al., 2013) is: 67.8%   Values used to calculate the score:     Age: 69 years     Sex: Male     Is Non-Hispanic African American: No     Diabetic: Yes     Tobacco smoker: Yes     Systolic Blood Pressure: 156 mmHg     Is BP treated: Yes     HDL Cholesterol: 32 mg/dL     Total Cholesterol: 273 mg/dL   A/P: Diabetes longstanding currently uncontrolled with an A1C of 13.6%. Control is suboptimal due to limited medication access with inability to retrieve medications from [facility] pharmacy. Suspect home sugars are elevated as fasting POCT glucose was 360 and patient unable to check BG at home. Will continue current DM medications as listed below.  Patient has taken Lantus before and is familiar with how to use the pen. However, extensively discussed proper injection technique  with teach-back method. All prescription medications and DM testing supplies have been sent to [facility].  -Continue Lantus 20 units daily  -Continue Sitagliptin-metformin 50-1000 mg BID -Sent glucometer and testing supplies to [facility] pharmacy -Extensively discussed pathophysiology of diabetes, recommended lifestyle interventions, dietary effects on blood sugar  control -Counseled on s/sx of and management of hypoglycemia -Next A1C anticipated October 2021.   Hypertension: Clinic BP at Goal = < 130/80 mmHg. Patient is not taking Losartan-HCTZ due to inability to retrrieve medications from [employer] [facility]. New prescription sent [facility] pharmacy. -Continue Losartan-HCTZ 100-25 mg daily -Plan to check CMET in two weeks to assess potassium and renal function -Counseled on lifestyle modifications for blood pressure control including reduced dietary sodium, increased exercise, adequate sleep.  ASCVD risk - primary prevention in patient with diabetes. Last LDL is not controlled at 199. ASCVD risk score is >20%  - high intensity statin indicated.  -Continued Atorvastatin 80 mg.   Written patient instructions provided.  Total time in face to face counseling 50 minutes.   Follow up Pharmacist Clinic Visit in 1 week.    Fabio Neighbors, PharmD PGY2 Ambulatory Care Resident [facility]   Butch Penny, PharmD, CPP Clinical Pharmacist [facility] [phone]

## 2019-09-23 ENCOUNTER — Other Ambulatory Visit: Payer: Self-pay | Admitting: Family Medicine

## 2019-09-23 ENCOUNTER — Other Ambulatory Visit: Payer: Self-pay

## 2019-09-23 ENCOUNTER — Ambulatory Visit: Payer: Medicare HMO | Attending: Family Medicine | Admitting: Pharmacist

## 2019-09-23 ENCOUNTER — Encounter: Payer: Self-pay | Admitting: Pharmacist

## 2019-09-23 VITALS — BP 114/70 | HR 77

## 2019-09-23 DIAGNOSIS — E0842 Diabetes mellitus due to underlying condition with diabetic polyneuropathy: Secondary | ICD-10-CM | POA: Diagnosis not present

## 2019-09-23 DIAGNOSIS — I10 Essential (primary) hypertension: Secondary | ICD-10-CM | POA: Diagnosis not present

## 2019-09-23 DIAGNOSIS — E782 Mixed hyperlipidemia: Secondary | ICD-10-CM

## 2019-09-23 DIAGNOSIS — E119 Type 2 diabetes mellitus without complications: Secondary | ICD-10-CM | POA: Diagnosis not present

## 2019-09-23 LAB — GLUCOSE, POCT (MANUAL RESULT ENTRY): POC Glucose: 360 mg/dl — AB (ref 70–99)

## 2019-09-23 MED ORDER — ACCU-CHEK GUIDE ME W/DEVICE KIT: PACK | 0 refills | Status: DC

## 2019-09-23 MED ORDER — JANUMET 50-1000 MG PO TABS: 1.0000 | ORAL_TABLET | Freq: Two times a day (BID) | ORAL | 1 refills | Status: DC

## 2019-09-23 MED ORDER — ACCU-CHEK SOFTCLIX LANCETS MISC
6 refills | Status: DC
Start: 2019-09-23 — End: 2020-10-14

## 2019-09-23 MED ORDER — LANTUS SOLOSTAR 100 UNIT/ML ~~LOC~~ SOPN: 20.0000 [IU] | PEN_INJECTOR | Freq: Every day | SUBCUTANEOUS | 2 refills | Status: DC

## 2019-09-23 MED ORDER — ACCU-CHEK GUIDE VI STRP: ORAL_STRIP | 6 refills | Status: DC

## 2019-09-23 MED ORDER — ATORVASTATIN CALCIUM 80 MG PO TABS: 80.0000 mg | ORAL_TABLET | Freq: Every day | ORAL | 3 refills | Status: DC

## 2019-09-23 MED ORDER — GABAPENTIN 100 MG PO CAPS: 100.0000 mg | ORAL_CAPSULE | Freq: Three times a day (TID) | ORAL | 3 refills | Status: DC

## 2019-09-23 MED ORDER — TRUEPLUS PEN NEEDLES 32G X 4 MM MISC: 2 refills | Status: DC

## 2019-09-23 MED ORDER — LOSARTAN POTASSIUM-HCTZ 100-25 MG PO TABS: 1.0000 | ORAL_TABLET | Freq: Every day | ORAL | 3 refills | Status: DC

## 2019-09-23 MED FILL — LANTUS SOLOSTAR 100 UNITS/M: 100 | 75 days supply | Qty: 15 | Fill #0

## 2019-09-23 MED FILL — ACCU-CHEK SOFTCLIX LANCETS: 33 days supply | Qty: 100 | Fill #0

## 2019-09-23 MED FILL — TRUEplus 5-BEVEL PEN NEEDLE: 32G X 4 MM | 90 days supply | Qty: 100 | Fill #0

## 2019-09-23 MED FILL — ATORVASTATIN 80 MG TABLET: 80 | 90 days supply | Qty: 90 | Fill #0

## 2019-09-23 MED FILL — ACCU-CHEK GUIDE TEST STRIP: 33 days supply | Qty: 100 | Fill #0

## 2019-09-23 MED FILL — JANUMET 50-1,000 MG TABLET: 50-1000 | 90 days supply | Qty: 180 | Fill #0

## 2019-09-23 MED FILL — LOSARTAN-HCTZ 100-25 MG TAB: 100-25 | 90 days supply | Qty: 90 | Fill #0

## 2019-09-23 MED FILL — ACCU-CHEK GUIDE ME W/DEVICE: W/DEVICE | 1 days supply | Qty: 1 | Fill #0

## 2019-09-23 MED FILL — GABAPENTIN 100 MG CAPSULE: 100 | 30 days supply | Qty: 90 | Fill #0

## 2019-09-29 NOTE — Progress Notes (Signed)
S:     PCP: Gwinda Passe, NP  Patient arrives in good spirits with an in-person interpreter. Presents for 1-week follow-up visit.  Patient was referred and last seen by Primary Care Provider on 09/16/19.   PMH: CAD, T2DM, HLD, HTN  At last visit with clinical pharmacist on 09/23/19, patient reported difficultly accessing prescription medications from [facility]. Therefore, all prescriptions and glucometer were sent to [facility] outpatient pharmacy. No medication changes were made.  Today, patient reports taking 20 units of Lantus twice daily instead of once daily and sitaglipitin-metformin 50-1000 mg twice daily since last visit . Also reports improvements in urinary frequency, dry mouth, and neuropathy since starting his DM medications including gabapentin. Patient did not bring glucometer, but reports sugars before breakfast have been 420, 396 and 419. Denies any sugars below 70.   Family/Social History: -Tobacco use: Current every day smoker - 0.25 ppd  Insurance coverage/medication affordability: [employer]  Medication adherence reported. Current diabetes medications include: Lantus 20 units daily (taking 20u twice daily), Sitagliptin-metformin 50-1000 mg BID. Current hypertension medications include: Losartan-HCTZ 100-25 mg daily  Current hyperlipidemia medications include: Atorvastatin 80 mg daily  Patient denies hypoglycemic events.  Patient reported dietary habits: Eats 2 meals/day (lunch and dinner) -Eats small portions -Rice, vegetables, fish, limits meat, fruit -Drinks water daily, sometimes soda, juice, and energy drinks when he feels tired   Patient reports nocturia (nighttime urination). (improved) Patient reports neuropathy (nerve pain). (improved)  O:   Home fasting blood sugars: 420, 396, 419  2 hour post-meal/random blood sugars: none  POCT: 435 (post-prandial: loaf a bread, water, soy milk)  UA dipstick was checked: negative ketones, positive  for glucose   15 min POCT glucose after 12 units of Novolog: 496 (left hand), 499 (right hand)   Lab Results  Component Value Date   HGBA1C 13.6 (A) 08/12/2019   There were no vitals filed for this visit.  Lipid Panel     Component Value Date/Time   CHOL 273 (H) 08/12/2019 1631   TRIG 218 (H) 08/12/2019 1631   HDL 32 (L) 08/12/2019 1631   CHOLHDL 8.5 (H) 08/12/2019 1631   CHOLHDL 5.7 01/22/2010 0530   VLDL 14 01/22/2010 0530   LDLCALC 199 (H) 08/12/2019 1631    Clinical Atherosclerotic Cardiovascular Disease (ASCVD): No  The 10-year ASCVD risk score Denman George DC Jr., et al., 2013) is: 47.5%   Values used to calculate the score:     Age: 73 years     Sex: Male     Is Non-Hispanic African American: No     Diabetic: Yes     Tobacco smoker: Yes     Systolic Blood Pressure: 114 mmHg     Is BP treated: Yes     HDL Cholesterol: 32 mg/dL     Total Cholesterol: 273 mg/dL   A/P: Diabetes longstanding currently uncontrolled with A1C 13.6%. Post-prandial POCT glucose result of 436 is consistent with elevated home blood sugar readings. Even though patient reported hyperglycemic symptoms have improved since starting DM medications, blood glucose readings remain significantly elevated on 20 units of lantus twice daily. Instructed patient to starting taking Lantus 50 units ONCE daily in the mornings and to continue sitaglipitin-metformin 50-1000 mg BID. Also instructed patient to bring glucometer and medications to his next follow-up visit in 1 week. Patient verbalized understanding via the in-person translator.   Due to clinic POCT glucose elevated at 436, we collected an urinalysis dipstick and results were negative for ketones  and positive for glucose. Luke consulted Gwinda Passe, NP (PCP) and she recommended 12 units of Novolog to be administered in clinic today. Patient was provided with 2 cups of water. Glucose was rechecked 15 mins after insulin adminstration and readings remained  elevated at 496 and 499. Due to the continued increase in blood sugars, Luke contacted [place] again and she advised the patient to go to the ED as soon as possible for further blood sugar management. Patient agreed to visit the ED. Franky Macho also contacted the charge nurse at [facility] ED to let her know the patient is on the way and also provided her with his demographic information.   Plan -Increased Lantus to 50 units ONCE daily in the mornings -Continued Sitagliptin-Metformin 50-1000 mg BID.  -Refer to ED -Extensively discussed pathophysiology of diabetes, recommended lifestyle interventions, dietary effects on blood sugar control -Counseled on s/sx of and management of hypoglycemia -Next A1C anticipated October 2021.    ASCVD risk - primary prevention in patient with diabetes. Last LDL is not controlled at 199. ASCVD risk score is >20%  - high intensity statin indicated.  -Continued Atorvastatin 80 mg.  Hypertension currently at goal  Blood pressure goal = <130/80 mmHg. Reports medication adherence with losartan-HCTZ 100-25 mg daily.  -Continued Losartan-HCTZ 100-25 mg daily  -Instructed patient to bring wrist BP monitor to next visit for proper technique training   Written patient instructions provided. Total time in face to face counseling 60 minutes.   Follow up Pharmacist Clinic Visit in 1 week.    Fabio Neighbors, PharmD PGY2 Ambulatory Care Resident [facility]

## 2019-09-30 ENCOUNTER — Ambulatory Visit: Payer: Medicare HMO | Attending: Primary Care | Admitting: Pharmacist

## 2019-09-30 ENCOUNTER — Telehealth: Payer: Self-pay | Admitting: *Deleted

## 2019-09-30 ENCOUNTER — Other Ambulatory Visit: Payer: Self-pay | Admitting: Primary Care

## 2019-09-30 ENCOUNTER — Other Ambulatory Visit: Payer: Self-pay

## 2019-09-30 ENCOUNTER — Encounter: Payer: Self-pay | Admitting: Pharmacist

## 2019-09-30 ENCOUNTER — Emergency Department (HOSPITAL_COMMUNITY)
Admission: EM | Admit: 2019-09-30 | Discharge: 2019-10-01 | Disposition: A | Discharge status: Home / Self Care | Payer: Medicare HMO | Attending: Emergency Medicine | Admitting: Emergency Medicine

## 2019-09-30 VITALS — BP 106/69 | HR 85

## 2019-09-30 DIAGNOSIS — R5383 Other fatigue: Secondary | ICD-10-CM | POA: Diagnosis not present

## 2019-09-30 DIAGNOSIS — E119 Type 2 diabetes mellitus without complications: Secondary | ICD-10-CM

## 2019-09-30 DIAGNOSIS — Z794 Long term (current) use of insulin: Secondary | ICD-10-CM | POA: Diagnosis not present

## 2019-09-30 DIAGNOSIS — Z5321 Procedure and treatment not carried out due to patient leaving prior to being seen by health care provider: Secondary | ICD-10-CM | POA: Insufficient documentation

## 2019-09-30 DIAGNOSIS — R531 Weakness: Secondary | ICD-10-CM | POA: Insufficient documentation

## 2019-09-30 DIAGNOSIS — R739 Hyperglycemia, unspecified: Secondary | ICD-10-CM | POA: Insufficient documentation

## 2019-09-30 DIAGNOSIS — R7309 Other abnormal glucose: Secondary | ICD-10-CM

## 2019-09-30 LAB — CBC
HCT: 42.6 % (ref 39.0–52.0)
Hemoglobin: 13.9 g/dL (ref 13.0–17.0)
MCH: 30.3 pg (ref 26.0–34.0)
MCHC: 32.6 g/dL (ref 30.0–36.0)
MCV: 92.8 fL (ref 80.0–100.0)
Platelets: 152 10*3/uL (ref 150–400)
RBC: 4.59 MIL/uL (ref 4.22–5.81)
RDW: 12.4 % (ref 11.5–15.5)
WBC: 7.3 10*3/uL (ref 4.0–10.5)
nRBC: 0 % (ref 0.0–0.2)

## 2019-09-30 LAB — POCT URINALYSIS DIP (CLINITEK)
Bilirubin, UA: NEGATIVE
Blood, UA: NEGATIVE
Glucose, UA: 500 mg/dL — AB
Ketones, POC UA: NEGATIVE mg/dL
Leukocytes, UA: NEGATIVE
Nitrite, UA: NEGATIVE
POC PROTEIN,UA: 100 — AB
Spec Grav, UA: 1.025 (ref 1.010–1.025)
Urobilinogen, UA: 0.2 E.U./dL
pH, UA: 6.5 (ref 5.0–8.0)

## 2019-09-30 LAB — BASIC METABOLIC PANEL
Anion gap: 12 (ref 5–15)
BUN: 34 mg/dL — ABNORMAL HIGH (ref 8–23)
CO2: 25 mmol/L (ref 22–32)
Calcium: 9.5 mg/dL (ref 8.9–10.3)
Chloride: 96 mmol/L — ABNORMAL LOW (ref 98–111)
Creatinine, Ser: 1.44 mg/dL — ABNORMAL HIGH (ref 0.61–1.24)
GFR calc Af Amer: 57 mL/min — ABNORMAL LOW (ref >60)
GFR calc non Af Amer: 49 mL/min — ABNORMAL LOW (ref >60)
Glucose, Bld: 484 mg/dL — ABNORMAL HIGH (ref 70–99)
Potassium: 4 mmol/L (ref 3.5–5.1)
Sodium: 133 mmol/L — ABNORMAL LOW (ref 135–145)

## 2019-09-30 LAB — GLUCOSE, POCT (MANUAL RESULT ENTRY)
POC Glucose: 435 mg/dl — AB (ref 70–99)
POC Glucose: 496 mg/dl — AB (ref 70–99)
POC Glucose: 499 mg/dl — AB (ref 70–99)

## 2019-09-30 LAB — URINALYSIS, ROUTINE W REFLEX MICROSCOPIC
Bacteria, UA: NONE SEEN
Bilirubin Urine: NEGATIVE
Glucose, UA: >=500 mg/dL — AB
Hgb urine dipstick: NEGATIVE
Ketones, ur: NEGATIVE mg/dL
Leukocytes,Ua: NEGATIVE
Nitrite: NEGATIVE
Protein, ur: 100 mg/dL — AB
Specific Gravity, Urine: 1.021 (ref 1.005–1.030)
pH: 5 (ref 5.0–8.0)

## 2019-09-30 LAB — CBG MONITORING, ED: Glucose-Capillary: 447 mg/dL — ABNORMAL HIGH (ref 70–99)

## 2019-09-30 MED ORDER — INSULIN ASPART 100 UNIT/ML ~~LOC~~ SOLN
12.0000 [IU] | Freq: Once | SUBCUTANEOUS | Status: AC
  Administered 2019-09-30: 12 [IU] via SUBCUTANEOUS

## 2019-09-30 MED ORDER — LANTUS SOLOSTAR 100 UNIT/ML ~~LOC~~ SOPN: 50.0000 [IU] | PEN_INJECTOR | Freq: Every day | SUBCUTANEOUS | 2 refills | Status: DC

## 2019-09-30 NOTE — Telephone Encounter (Signed)
Patient did not return my call today. No show letter mailed to pt today and cancelled PV and procedure.

## 2019-09-30 NOTE — ED Triage Notes (Signed)
Pt reports noting his blood sugar has been in the 400s since last night. Pt reports symtoms of weakness, fatigue. Alert, oriented x4, NAD at present. VSS.

## 2019-09-30 NOTE — Telephone Encounter (Signed)
Called patient, no answer, left message for the patient to call us back before 5 pm today.

## 2019-09-30 NOTE — Telephone Encounter (Signed)
Patient no show PV today, patient was called x2 by the interpreter no answer. I called the patient's cell # and left a message for him to call us back to reschedule PV.

## 2019-10-06 NOTE — Progress Notes (Signed)
S:    PCP: Gwinda Passe, NP  PMH: CAD, T2DM, HLD, HTN  Patient arrives in good spirits for 1 week DM follow-up s/p ED admission on 09/30/19 for hyperglycemia. Patient was referred and last seen by Primary Care Provider on8/11/21. At last visit with clinical pharmacist on 09/30/19, patient BG was elevated in 400s after receiving 12 units of Novolog in clinic (negative ketones). Lantus was increased to 50 units daily and referred to ED for additional diabetes management.  Today, patient reports adherence with all medications including Lantus 50 units daily (took this morning), sitagliptin-metformin, losartan-HCTZ, atorvastatin and gabapentin (brougt medications to visit except for Lantus and sitagliptin-metformin). Reports that he left the ED on 09/30/19 due to long wait hours (>8 hours). Glucometer BG readings from 8/19 to 9/1: 8AM to 11AM - 236, 235, 304, 326, 411, 394, 424 and BG prior to visit was 154; PM readings - 308, 188. Patient could not recall if he checks his sugars before or after eating in the mornings, but states sometimes check sugars 2 hours after eating. Reports nocturia has significantly improved and denies symptoms of hypoglycemia. Regarding blood pressure, patient uses a wrist cuff to measure BP: 163/72, 179/80, 172/94, 250/119, 201/94, 124/76. Denies SOB, chest pain, dizziness. Reports infrequent headaches.   Family/Social History: -Tobacco use: Current every day smoker - 0.25 ppd  Insurance coverage/medication affordability:[employer] Medicare  Medication adherence reported  Current diabetes medications include: Lantus 50 units daily, Sitagliptin-metformin 50-1000 mg BID. Current hypertension medications include: Losartan-HCTZ 100-25 mg daily  Current hyperlipidemia medications include: Atorvastatin 80 mg daily  Patient denies hypoglycemic events.  Patient reported dietary habits: Eats91meals/day (lunch and dinner) -Eats small portions -Rice, vegetables, fish, limits  meat, fruit -Drinks water daily, sometimes soda, juice, and energy drinks when he feels tired  Patient denies nocturia (nighttime urination).  Patient reports neuropathy (nerve pain). Patient denies visual changes.   O:  Home sugars readings per glucometer: 8/19 to 9/1  8AM to 11AM - 236, 235, 304, 326, 411, 394, 424 and BG prior to visit was 154;  Afternoon readings - 308, 188  POCT: 154 (oatmeal @ 10AM)  Home BP readings per wrist cuff:  163/72, 179/80, 172/94, 250/119, 201/94, 124/76  Lab Results  Component Value Date   HGBA1C 13.6 (A) 08/12/2019   Vitals:   10/07/19 1400  BP: 115/70  Pulse: 75    Lipid Panel     Component Value Date/Time   CHOL 273 (H) 08/12/2019 1631   TRIG 218 (H) 08/12/2019 1631   HDL 32 (L) 08/12/2019 1631   CHOLHDL 8.5 (H) 08/12/2019 1631   CHOLHDL 5.7 01/22/2010 0530   VLDL 14 01/22/2010 0530   LDLCALC 199 (H) 08/12/2019 1631    Clinical Atherosclerotic Cardiovascular Disease (ASCVD): No  The 10-year ASCVD risk score Denman George DC Jr., et al., 2013) is: 48.1%   Values used to calculate the score:     Age: 34 years     Sex: Male     Is Non-Hispanic African American: No     Diabetic: Yes     Tobacco smoker: Yes     Systolic Blood Pressure: 115 mmHg     Is BP treated: Yes     HDL Cholesterol: 32 mg/dL     Total Cholesterol: 273 mg/dL   Wrist cuff: 010/27, HR 80 Plan Victoza 0.6 mg daily lantus 50 units daily  Recommend LDL October  PCP in November    A/P: Diabetes currently uncontrolled with A1C of 13.6%. Post-prandial  POCT glucose was at goal today, however home sugars remain elevated (300-400s). Patient appears to be adherent with all DM medications including Lantus 50 units daily with no reported hypoglycemic events. Will plan to add Victoza 0.6 mg daily for additional diabetes management. Discussed clinical benefits, side-effects, and proper injection technique with teach-back method.  -Initiated Victoza 0.6 mg once daily for 7  days, then 1.2 mg daily for 7 days, then 1.8 mg daily thereafter -Discontinued sitagliptin-metformin -Initiated metformin 1000 mg with breakfast and dinner -Continued Lantus 50 units in the morning -Extensively discussed pathophysiology of diabetes, recommended lifestyle interventions, dietary effects on blood sugar control -Counseled on s/sx of and management of hypoglycemia -Next A1C anticipated October 2021.   ASCVD risk - primary prevention in patient with diabetes and history of CAD. Last LDLis notcontrolled at 199 (July 2021). ASCVD risk scoreis>20% - highintensity statin indicated.  -ContinuedAtorvastatin80mg . -Consider checking lipid panel in October, if elevated consider Zetia 10 mg daily  Hypertension currently controlled with clinic BP of 115/70 mmHg.  Blood pressure goal = <130/80 mmHg. Reports medication adherence with losartan-HCTZ 100-25 mg daily. Patient brought BP wrist cuff machine to clinic and it measured 156/70, however our clinic BP machine measured BP accurately at 115/70. Additionally, for the past two clinic visits, BP has been at goal. Explained to patient that his wrist cuff is measuring inaccurate readings and is not a reliable source to monitor BP. Encouraged patient to purchase an arm BP monitor for [facility], [facility], or [facility]. Patient verbalized understanding.  -Continued Losartan-HCTZ 100-25 mg daily   Written patient instructions provided.  Total time in face to face counseling 60 minutes.   Follow up Pharmacist Clinic Visit in 1 week.     Fabio Neighbors, PharmD PGY2 Ambulatory Care Resident [facility]  [facility]armacy

## 2019-10-07 ENCOUNTER — Ambulatory Visit: Payer: Medicare HMO | Attending: Family Medicine | Admitting: Pharmacist

## 2019-10-07 ENCOUNTER — Other Ambulatory Visit: Payer: Self-pay

## 2019-10-07 ENCOUNTER — Encounter: Payer: Self-pay | Admitting: Pharmacist

## 2019-10-07 ENCOUNTER — Other Ambulatory Visit: Payer: Self-pay | Admitting: Family Medicine

## 2019-10-07 VITALS — BP 115/70 | HR 75

## 2019-10-07 DIAGNOSIS — Z794 Long term (current) use of insulin: Secondary | ICD-10-CM | POA: Diagnosis not present

## 2019-10-07 DIAGNOSIS — E119 Type 2 diabetes mellitus without complications: Secondary | ICD-10-CM | POA: Diagnosis not present

## 2019-10-07 DIAGNOSIS — I10 Essential (primary) hypertension: Secondary | ICD-10-CM | POA: Diagnosis not present

## 2019-10-07 LAB — GLUCOSE, POCT (MANUAL RESULT ENTRY): POC Glucose: 154 mg/dl — AB (ref 70–99)

## 2019-10-07 MED ORDER — VICTOZA 18 MG/3ML ~~LOC~~ SOPN: PEN_INJECTOR | SUBCUTANEOUS | 1 refills | Status: DC

## 2019-10-07 MED ORDER — METFORMIN HCL 1000 MG PO TABS: 1000.0000 mg | ORAL_TABLET | Freq: Two times a day (BID) | ORAL | 2 refills | Status: DC

## 2019-10-07 MED FILL — VICTOZA 2-PAK 18 MG/3 ML PE: 18 | 17 days supply | Qty: 3 | Fill #0

## 2019-10-07 MED FILL — metFORMIN HCL 1000 MG TABS: 1000 | 30 days supply | Qty: 60 | Fill #0

## 2019-10-07 NOTE — Patient Instructions (Addendum)
It was nice to see you today!  Your goal blood sugar is 80-130 before eating and less than 180 after eating. In clinic, your blood sugar was 154 mg/dL.  Medication Changes: Begin injecting Victoza 0.6 mg once daily in to the lower abdomen or upper thigh.  Begin taking Metformin 1000 mg - 1 tablet with breakfast and 1 tablet with dinner  Continue taking Lantus 50 units in the morning  Stop taking Sitagliptin-Metformin that you have at home - throw that away!  Try to purchase an arm blood pressure monitor for [facility], [facility], or [facility]  Monitor blood sugars at home and keep a log (glucometer or piece of paper) to bring with you to your next visit.  Keep up the good work with diet and exercise. Aim for a diet full of vegetables, fruit and lean meats (chicken, [place], fish). Try to limit salt intake by eating fresh or frozen vegetables (instead of canned), rinse canned vegetables prior to cooking and do not add any additional salt to meals.

## 2019-10-13 NOTE — Progress Notes (Signed)
S:     PCP: Gwinda Passe, NP PMH: CAD, T2DM, HLD, HTN  Patient arrives in good spirits for 1 week DM follow-up visit. Patient was referred and last seen by Primary Care Provider on 09/16/19 and last seen with clinical pharmacist on 10/07/19 in which Victoza 0.6 mg daily x7 days was initiated, sitagliptin-metformin was switched to metformin 1000 mg BID, and Lantus 50 units daily was continued.  Today, patient reports poor appetite. He has not reported this to pharmacy previously, however, he endorses poor appetite for the past year. He does not think this is due to his Victoza as it was occurring before Victoza initiation. Denies NV. Denies abdominal pain. In regards to his medication, he reports compliance with metformin, Lantus, and Victoza. He did not bring these in with him, nor did he bring his glucometer today. Denies S/sx of hypoglycemia. Denies polyuria, polydipsia. Reports neuropathy. Denies any changes in vision.   Family/Social History: - Family hx:  -Tobacco use: Current every day smoker - 0.25 ppd - Alcohol use:   Insurance coverage/medication affordability:[employer] Medicare  Medication adherence reported. Current diabetes medications include:Lantus 50 units daily, Victoza 0.6 mg daily, metformin 1000 mg BID. Current hypertension medications include:Losartan-HCTZ 100-25 mg daily Current hyperlipidemia medications include:Atorvastatin 80 mg daily  Patient denies hypoglycemic events.  Patient reported dietary habits: Eats64meals/day (lunch and dinner) -Eats small portions -Rice, vegetables, fish, limits meat, fruit -Drinks water daily, sometimes soda, juice, and energy drinks when he feels tired - No changes since last visit  Patient denies nocturia (nighttime urination).  Patient reports neuropathy (nerve pain). Patient denies visual changes.   O:   POCT: 185   Lab Results  Component Value Date   HGBA1C 13.6 (A) 08/12/2019   There were no vitals filed  for this visit.  Lipid Panel     Component Value Date/Time   CHOL 273 (H) 08/12/2019 1631   TRIG 218 (H) 08/12/2019 1631   HDL 32 (L) 08/12/2019 1631   CHOLHDL 8.5 (H) 08/12/2019 1631   CHOLHDL 5.7 01/22/2010 0530   VLDL 14 01/22/2010 0530   LDLCALC 199 (H) 08/12/2019 1631    Home fasting blood sugars: unable to recall, no meter with him  2 hour post-meal/random blood sugars: unable to recall, no meter with him. Denies any readings <70 or > 300.   Clinical Atherosclerotic Cardiovascular Disease (ASCVD): No  The 10-year ASCVD risk score Denman George DC Jr., et al., 2013) is: 48.1%   Values used to calculate the score:     Age: 47 years     Sex: Male     Is Non-Hispanic African American: No     Diabetic: Yes     Tobacco smoker: Yes     Systolic Blood Pressure: 115 mmHg     Is BP treated: Yes     HDL Cholesterol: 32 mg/dL     Total Cholesterol: 273 mg/dL    A/P: Diabetes currently uncontrolled with A1C of 13.6%. Patient denies hypoglycemia and is able to verbalize appropriate hypoglycemia management plan. Medication adherence appears appropriate, however, he did not bring medications with him today. He does not have any home CBG data with him today and is unable to recall specific readings from home. I have encouraged him to bring his medications and home meter in for a visit next week. Will titrate Victoza to 1.2 mg daily.   -Continued Lantus 50 units daily.  -Continued metformin 1000 mg BID.  - Increase Victoza to 1.2 mg daily.  -Extensively  discussed pathophysiology of diabetes, recommended lifestyle interventions, dietary effects on blood sugar control -Counseled on s/sx of and management of hypoglycemia -Next A1C anticipated October 2021.   ASCVD risk - primary prevention in patient with diabetes and history of CAD. Last LDLis notcontrolled at 199 (July 2021). ASCVD risk scoreis>20% - highintensity statin indicated.  -ContinuedAtorvastatin80mg . -Consider checking lipid  panel in October, if elevated consider Zetia 10 mg daily  Written patient instructions provided.  Total time in face to face counseling 15 minutes.   Follow up Pharmacist Clinic Visit in 1 week.  Butch Penny, PharmD, CPP Clinical Pharmacist [facility] [phone]

## 2019-10-14 ENCOUNTER — Encounter: Payer: Medicare HMO | Admitting: Gastroenterology

## 2019-10-14 ENCOUNTER — Encounter: Payer: Self-pay | Admitting: Pharmacist

## 2019-10-14 ENCOUNTER — Other Ambulatory Visit: Payer: Self-pay

## 2019-10-14 ENCOUNTER — Ambulatory Visit: Payer: Medicare HMO | Attending: Family Medicine | Admitting: Pharmacist

## 2019-10-14 DIAGNOSIS — Z794 Long term (current) use of insulin: Secondary | ICD-10-CM

## 2019-10-14 DIAGNOSIS — E119 Type 2 diabetes mellitus without complications: Secondary | ICD-10-CM

## 2019-10-14 LAB — GLUCOSE, POCT (MANUAL RESULT ENTRY): POC Glucose: 185 mg/dl — AB (ref 70–99)

## 2019-10-20 NOTE — Progress Notes (Signed)
S:     PCP: Gwinda Passe, NP PMH: CAD, T2DM, HLD, HTN  Patient arrives in good spirits for 1 week DM follow-up visit. Patient was referred and last seen by Primary Care Provider on 09/16/19 and last seen in pharmacy clinic on 10/14/19 in which Victoza was increased to 1.2 mg x7 days.   Today, patient reports not taking Lantus 50 units daily since starting Victoza 2 weeks ago. Patient thought Victoza was replacing Lantus. Reports adherence with Victoza and metformin and denies diarrhea, NV, and abdominal pain. However, reports lower leg and back ache for about 2 months. Home BG readings per glucometer range from 170s-300s in the morning (fasting) and 150s-400s in the afternoon/evening. Additionally, reports poor appetite has not changed.  Family/Social History: - Family hx: no pertinent positives  -Tobacco use: Current every day smoker - 0.25 ppd - Alcohol use: none   Insurance coverage/medication affordability:[employer]  Medication adherence reported as not taking Lantus for 2 weeks. Current diabetes medications include:  Lantus50units daily (not taking)  Victoza 1.2 mg daily  Metformin 1000 mg BID  Current hypertension medications include:Losartan-HCTZ 100-25 mg daily Current hyperlipidemia medications include:Atorvastatin 80 mg daily  Patient denies hypoglycemic events.  Patient reported dietary habits: Eats15meals/day (lunch and dinner) -Eats small portions -Rice, vegetables, fish, limits meat, fruit -Drinks water daily, sometimes soda, juice, and energy drinks when he feels tired   Patient reports nocturia (nighttime urination).  O:  Sugars: 8/26 - 9/15  Home fasting blood sugars: 390 this morning, 342, 297, 405, 201, 170, 188, 236, 235  2 hour post-meal/random blood sugars: : 440, 233, 159, 269, 154, 308  POCT: 394 (post prandial - fish soup)  Lab Results  Component Value Date   HGBA1C 13.6 (A) 08/12/2019   There were no vitals filed for this  visit.  Lipid Panel     Component Value Date/Time   CHOL 273 (H) 08/12/2019 1631   TRIG 218 (H) 08/12/2019 1631   HDL 32 (L) 08/12/2019 1631   CHOLHDL 8.5 (H) 08/12/2019 1631   CHOLHDL 5.7 01/22/2010 0530   VLDL 14 01/22/2010 0530   LDLCALC 199 (H) 08/12/2019 1631   Clinical Atherosclerotic Cardiovascular Disease (ASCVD): Yes  The 10-year ASCVD risk score Denman George DC Jr., et al., 2013) is: 48.1%   Values used to calculate the score:     Age: 60 years     Sex: Male     Is Non-Hispanic African American: No     Diabetic: Yes     Tobacco smoker: Yes     Systolic Blood Pressure: 115 mmHg     Is BP treated: Yes     HDL Cholesterol: 32 mg/dL     Total Cholesterol: 273 mg/dL   A/P: Diabetes longstanding currently uncontrolled with A1C of 13.6% and elevated BG readings. Control suboptimal due to medication non-adherence with Lantus 50 units daily as patient thought Victoza was replacing Lantus. Explained to patient that he should be taking BOTH Lantus and Victoza and to rotate injection sites when administering. Instructed patient to increase Victoza to 1.8 mg daily. Patient verbalized understanding via in-person translator. Additionally, patient reported poor appetite has not really changed. Recommended Ensure Max Protein or Glucerna Hunger Smart for meal supplementation (diabetes friendly) and could be found in [employer].  -Restarted Lantus 50 units daily starting today.  -Increased Victoza to 1.8 mg daily -Continued metformin 1000 mg BID -Extensively discussed pathophysiology of diabetes, recommended lifestyle interventions, dietary effects on blood sugar control -Counseled on s/sx  of and management of hypoglycemia -Next A1C anticipated October 2021.   ASCVD risk - primary prevention in patient with diabetesand history of CAD.Last LDLis notcontrolled at 199(July 2021). ASCVD risk scoreis>20% - highintensity statin indicated. Due to extensive DM medication counseling, was not able to  address compliant of leg and back aches. Looks like patient was started on atorvastatin 80 mg around July 2021 and could be contributing to his aches.  -Consider decreasing atorvastatin from 80 to 40 mg daily at the next visit and reassess resolution of muscle aches  -Consider checking lipid panel in October, if elevated consider Zetia 10 mg daily  Written patient instructions provided.  Total time in face to face counseling 45 minutes.   Follow up Pharmacist Clinic Visit in 1 week.     Fabio Neighbors, PharmD PGY2 Ambulatory Care Resident [facility]  Pharmacy

## 2019-10-21 ENCOUNTER — Other Ambulatory Visit: Payer: Self-pay | Admitting: Primary Care

## 2019-10-21 ENCOUNTER — Encounter: Payer: Self-pay | Admitting: Pharmacist

## 2019-10-21 ENCOUNTER — Ambulatory Visit: Payer: Medicare HMO | Attending: Primary Care | Admitting: Pharmacist

## 2019-10-21 ENCOUNTER — Other Ambulatory Visit: Payer: Self-pay

## 2019-10-21 DIAGNOSIS — Z794 Long term (current) use of insulin: Secondary | ICD-10-CM | POA: Diagnosis not present

## 2019-10-21 DIAGNOSIS — E119 Type 2 diabetes mellitus without complications: Secondary | ICD-10-CM

## 2019-10-21 LAB — GLUCOSE, POCT (MANUAL RESULT ENTRY): POC Glucose: 394 mg/dl — AB (ref 70–99)

## 2019-10-21 MED ORDER — VICTOZA 18 MG/3ML ~~LOC~~ SOPN: 1.8000 mg | PEN_INJECTOR | Freq: Every day | SUBCUTANEOUS | 2 refills | Status: DC

## 2019-10-21 NOTE — Patient Instructions (Signed)
It was nice to see you today!  Your goal blood sugar is 80-130 before eating and less than 180 after eating. In clinic, your blood sugar was 394 mg/dL.  Medication Changes: TAKE Lantus 50 units daily in the mornings  AND   TAKE Victoza 1.8 mg daily in the mornings  Continue Metformin 1 tablet in the mornings and 1 tablet in the evenings  Bring Glucometer and ALL medications to your appointment next Wednesday.  Tel you children to purchase Ensure Max Protein or Glucerna Hunger Smart for meal supplementation from [employer] to help you feel stronger and more nutrition.   See you in 1 week

## 2019-10-22 ENCOUNTER — Telehealth: Payer: Self-pay

## 2019-10-22 MED FILL — VICTOZA 18 MG/3 ML INJECT P: 18 | 30 days supply | Qty: 9 | Fill #0

## 2019-10-22 NOTE — Telephone Encounter (Signed)
VICTOZA PA APPROVED THRU 02/05/20

## 2019-10-26 NOTE — Progress Notes (Signed)
S:     PCP: Gwinda Passe, NP PMH: CAD, T2DM, HLD, HTN  Patient arrives in good spirits for 1-week DM follow-up visit. Patient was referred and last seen by Primary Care Provider on 09/16/19 and last seen in pharmacy clinic on 10/21/19 in which Victoza was increased to 1.8 mg daily and patient was instructed to restart Lantus 50 units daily.   Today, patient reports medication adherence and no side effects with Lantus 50 units daily and metformin twice daily. Patient was unable to pick up Victoza from pharmacy at last visit due to pending PAP approval. BG readings from this past week were AM: 209, 223, 144, 390 and PM: 374, 232. Reports appetite is slowly improving - had noodles and water for breakfast and has seen little improvement in body aches.  Family/Social History: - Family hx:no pertinent positives  -Tobacco use: Current every day smoker - 0.25 ppd - Alcohol use: none   Insurance coverage/medication affordability:[employer] Medicare  Medication adherence reported as not taking Victoza for 1 week Current diabetes medications include:  Lantus50units daily   Victoza 1.8 mg daily (not taking)  Metformin 1000 mg BID  Current hypertension medications include:Losartan-HCTZ 100-25 mg daily Current hyperlipidemia medications include:Atorvastatin 80 mg daily  Patient denies hypoglycemic events.  Patient reported dietary habits: Eats27meals/day (lunch and dinner) -Eats small portions -Rice, vegetables, fish, limits meat, fruit -Drinks water daily, sometimes soda, juice, and energy drinks when he feels tired  Patient denies nocturia (nighttime urination).   O:  POCT: 228 (post-prandial)  Glucometer readings from 9/15 - 9/22 AM: 209, 223, 144, 390 PM: 374, 232  Lab Results  Component Value Date   HGBA1C 13.6 (A) 08/12/2019   There were no vitals filed for this visit.  Lipid Panel     Component Value Date/Time   CHOL 273 (H) 08/12/2019 1631   TRIG 218 (H)  08/12/2019 1631   HDL 32 (L) 08/12/2019 1631   CHOLHDL 8.5 (H) 08/12/2019 1631   CHOLHDL 5.7 01/22/2010 0530   VLDL 14 01/22/2010 0530   LDLCALC 199 (H) 08/12/2019 1631    Clinical Atherosclerotic Cardiovascular Disease (ASCVD): No  The 10-year ASCVD risk score Denman George DC Jr., et al., 2013) is: 48.1%   Values used to calculate the score:     Age: 69 years     Sex: Male     Is Non-Hispanic African American: No     Diabetic: Yes     Tobacco smoker: Yes     Systolic Blood Pressure: 115 mmHg     Is BP treated: Yes     HDL Cholesterol: 32 mg/dL     Total Cholesterol: 273 mg/dL    A/P: Diabetes longstanding currently uncontrolled with A1C of 13.6%. Improvement seen in today's POCT BG = 228 compared to last visit's POCT at 394, however home sugars remain elevated 200-300s (1 week of readings). Control suboptimal due to medication non-adherence - patient unable to start new dose of Victoza 1.8 mg daily last week due to pending patient assistance application. PAP now approved and ready for pick-up today.  -Started Victoza 1.8 mg daily - pick up from pharmacy today -Continued Lantus 50 units daily -Continued Metformin 1000 mg BID -Extensively discussed pathophysiology of diabetes, recommended lifestyle interventions, dietary effects on blood sugar control -Counseled on s/sx of and management of hypoglycemia -Next A1C anticipated October 2021.   ASCVD risk - primary prevention in patient with diabetesand history of CAD.Last LDLis notcontrolled at 199(July 2021). ASCVD risk scoreis>20% -  highintensity statin indicated. Patient reported little improvement in body aches. -DiscontinuedAtorvastatin80mg . -Started Rosuvastatin 40 mg daily -Consider checking lipid panel in October 2021, if elevated consider Zetia 10 mg daily  Written patient instructions provided. Total time in face to face counseling 35 minutes.   Follow up Pharmacist Clinic Visit in 2 week.    Fabio Neighbors, PharmD PGY2  Ambulatory Care Resident [facility]  Pharmacy

## 2019-10-28 ENCOUNTER — Other Ambulatory Visit: Payer: Self-pay | Admitting: Pharmacist

## 2019-10-28 ENCOUNTER — Ambulatory Visit: Payer: Medicare HMO | Attending: Primary Care | Admitting: Pharmacist

## 2019-10-28 ENCOUNTER — Encounter: Payer: Self-pay | Admitting: Pharmacist

## 2019-10-28 ENCOUNTER — Other Ambulatory Visit: Payer: Self-pay

## 2019-10-28 DIAGNOSIS — E119 Type 2 diabetes mellitus without complications: Secondary | ICD-10-CM | POA: Diagnosis not present

## 2019-10-28 DIAGNOSIS — Z794 Long term (current) use of insulin: Secondary | ICD-10-CM | POA: Diagnosis not present

## 2019-10-28 LAB — GLUCOSE, POCT (MANUAL RESULT ENTRY): POC Glucose: 228 mg/dl — AB (ref 70–99)

## 2019-10-28 MED ORDER — ROSUVASTATIN CALCIUM 40 MG PO TABS: 40.0000 mg | ORAL_TABLET | Freq: Every day | ORAL | 2 refills | Status: DC

## 2019-10-28 MED FILL — ROSUVASTATIN CALCIUM 40 MG: 40 | 30 days supply | Qty: 30 | Fill #0

## 2019-11-10 NOTE — Progress Notes (Signed)
S:     PCP: Gwinda Passe, NP PMH: CAD, T2DM, HLD, HTN  Patient arrives in good spirits for 2-week DM follow-up visit with an in-person intepreter. Patient was referred and last seen by Primary Care Provider on 09/16/19 and last seen in pharmacy clinic on 10/28/19 in which PAP application was approved and restarted on Victoza 1.8 mg daily.   Today, patient reports medication adherence with Victoza 1.8 mg daily, Lantus 50 units daily, and Metformin 1000 BID. Patient did not bring glucometer, however reports sugars 80-90s, 200-300s. Reports not checking BG consistently everyday because he "knows" when its high as he experiences dry mouth. Denies symptomatic hypoglycemia. Additionally, reports pain in lower calves which started in July and it comes and goes. Request refills for gabapentin and Lantus.  Family/Social History: - Family hx:no pertinent positives -Tobacco use: Current every day smoker - 0.25 ppd - Alcohol JIR:CVEL  Insurance coverage/medication affordability:[employer] Medicare  Medication adherence reported excellent.   Current diabetes medications include:  Lantus50units daily  Victoza1.8mg  daily  Metformin 1000 mg BID  Current hypertension medications include:Losartan-HCTZ 100-25 mg daily Current hyperlipidemia medications include:Rosuvastatin 40 mg daily  Patient denies hypoglycemic events.  Patient reported dietary habits: Eats28meals/day (lunch and dinner) -Eats small portions -Rice, vegetables, fish, limits meat, fruit -Drinks water daily, sometimes soda, juice, and energy drinks when he feels tired   Patient denies nocturia (nighttime urination).  Patient reports neuropathy (nerve pain).  O:  POCT: 373 (post-prandial)  Glucometer readings: 80-90s, 200-300s  Lab Results  Component Value Date   HGBA1C 12.7 (A) 11/11/2019   There were no vitals filed for this visit.  Lipid Panel     Component Value Date/Time   CHOL 273 (H)  08/12/2019 1631   TRIG 218 (H) 08/12/2019 1631   HDL 32 (L) 08/12/2019 1631   CHOLHDL 8.5 (H) 08/12/2019 1631   CHOLHDL 5.7 01/22/2010 0530   VLDL 14 01/22/2010 0530   LDLCALC 199 (H) 08/12/2019 1631    Clinical Atherosclerotic Cardiovascular Disease (ASCVD): No  The 10-year ASCVD risk score Denman George DC Jr., et al., 2013) is: 48.1%   Values used to calculate the score:     Age: 69 years     Sex: Male     Is Non-Hispanic African American: No     Diabetic: Yes     Tobacco smoker: Yes     Systolic Blood Pressure: 115 mmHg     Is BP treated: Yes     HDL Cholesterol: 32 mg/dL     Total Cholesterol: 273 mg/dL   A/P: Diabetes longstanding currently uncontrolled with an A1C of 12.7% as of today. Updated A1C has slightly improved from 13.6% to 12.7%. Medication adherence appears optimal, however overall home sugars remain elevated primarily in the 200-300s. Will start Novolog 5 units twice daily with meals. Counseled patient to take Novolog 15-20 minutess prior to meals and to not use Novolog if skipping a meal. Encouraged patient to check sugars at least twice daily - fasting and 1-2 hours after largest meal and to bring glucometer and medications to next visit. Patient verbalized understanding via in-person interpreter. If patient continues to have uncontrolled sugars while on Lantus and Novolog, he will be a potential candidate for a continuous glucose monitor to assess 24 hour glucose patterns and hypoglycemia events. -Started Novolog 5 units twice daily with meals -Continued Lantus 50 units daily -Continued Victoza 1.8 mg daily -Continued Metformin 1000 mg BID -Extensively discussed pathophysiology of diabetes, recommended lifestyle interventions, dietary effects  on blood sugar control -Counseled on s/sx of and management of hypoglycemia -Next A1C anticipated January 2022.   ASCVD risk - primary prevention in patient with diabetesand history of CAD.Last LDLis notcontrolled at 199(July  2021). ASCVD risk scoreis>20% - highintensity statin indicated.Patient reports pain in lower calves which started in July 2021 - same time around starting a statin. -Hold Rosuvastatin 40 mg daily and reassess pain in 3 weeks -Check lipid panel today, if elevated consider Zetia 10 mg daily in the future  Written patient instructions provided. Total time in face to face counseling 45 minutes.   Follow up Pharmacist in Clinic Visit in 3 weeks.    Fabio Neighbors, PharmD PGY2 Ambulatory Care Resident [facility]  Pharmacy

## 2019-11-11 ENCOUNTER — Ambulatory Visit: Payer: Medicare HMO | Attending: Primary Care | Admitting: Pharmacist

## 2019-11-11 ENCOUNTER — Other Ambulatory Visit: Payer: Self-pay

## 2019-11-11 ENCOUNTER — Other Ambulatory Visit: Payer: Self-pay | Admitting: Primary Care

## 2019-11-11 ENCOUNTER — Encounter: Payer: Self-pay | Admitting: Pharmacist

## 2019-11-11 DIAGNOSIS — E119 Type 2 diabetes mellitus without complications: Secondary | ICD-10-CM | POA: Diagnosis not present

## 2019-11-11 DIAGNOSIS — Z794 Long term (current) use of insulin: Secondary | ICD-10-CM | POA: Diagnosis not present

## 2019-11-11 LAB — POCT GLYCOSYLATED HEMOGLOBIN (HGB A1C): Hemoglobin A1C: 12.7 % — AB (ref 4.0–5.6)

## 2019-11-11 LAB — GLUCOSE, POCT (MANUAL RESULT ENTRY): POC Glucose: 373 mg/dl — AB (ref 70–99)

## 2019-11-11 MED ORDER — NOVOLOG FLEXPEN 100 UNIT/ML ~~LOC~~ SOPN: 5.0000 [IU] | PEN_INJECTOR | Freq: Two times a day (BID) | SUBCUTANEOUS | 3 refills | Status: DC

## 2019-11-11 MED FILL — LANTUS SOLOSTAR 100 UNITS/M: 100 | 30 days supply | Qty: 15 | Fill #0

## 2019-11-11 MED FILL — GABAPENTIN 100 MG CAPSULE: 100 | 30 days supply | Qty: 90 | Fill #1

## 2019-11-12 ENCOUNTER — Ambulatory Visit (INDEPENDENT_AMBULATORY_CARE_PROVIDER_SITE_OTHER): Payer: Medicare HMO | Admitting: Primary Care

## 2019-11-12 LAB — LIPID PANEL
Chol/HDL Ratio: 3.5 ratio (ref 0.0–5.0)
Cholesterol, Total: 130 mg/dL (ref 100–199)
HDL: 37 mg/dL — ABNORMAL LOW (ref >39)
LDL Chol Calc (NIH): 77 mg/dL (ref 0–99)
Triglycerides: 84 mg/dL (ref 0–149)
VLDL Cholesterol Cal: 16 mg/dL (ref 5–40)

## 2019-11-12 MED FILL — NOVOLOG FLEXPEN SYRINGE: 100 | 30 days supply | Qty: 3 | Fill #0

## 2019-11-13 ENCOUNTER — Telehealth (INDEPENDENT_AMBULATORY_CARE_PROVIDER_SITE_OTHER): Payer: Self-pay

## 2019-11-13 NOTE — Telephone Encounter (Signed)
-----   Message from Grayce Sessions, NP sent at 11/13/2019  2:41 PM EDT ----- Cholesterol has improved. Continue taking current dose of cholesterol medication.

## 2019-11-13 NOTE — Telephone Encounter (Signed)
Per DPR, left patients son a message informing that the patients cholesterol has improved and continue taking current dose of cholesterol medication. Return call to [facility] at [phone] with any questions or concerns. Maryjean Morn, CMA

## 2019-11-25 MED FILL — METFORMIN HCL 1000 MG TABS: 1000 | 30 days supply | Qty: 60 | Fill #1

## 2019-11-25 MED FILL — VICTOZA 18 MG/3 ML INJECT P: 18 | 30 days supply | Qty: 9 | Fill #1

## 2019-11-25 MED FILL — TRUEplus 5-BEVEL PEN NEEDLE: 32G X 4 MM | 50 days supply | Qty: 100 | Fill #1

## 2019-11-30 NOTE — Progress Notes (Signed)
S:     PCP: Gwinda Passe, NP PMH: CAD, T2DM, HLD, HTN  Patient arrives in good spirits for his 74-month DM follow-up visit with an in-person interpreter. Patient was referred and last seen by Primary Care Provider on 09/16/19 and last seen in pharmacy clinic on 11/11/19 in which A1c mildly decreased from 13.6% to 12.7%. BG was 373 and Novolog 5 units BID w/meals was started for additional BG control.  Today, patient reports medication adherence with metformin 1000 mg BID, Lantus 50 units daily, Victoza 1.8 mg daily, and Novolog 5 units BID w/meals. Reports one symptomatic hypoglycemic event at 55 mg/dL with increased hunger and fatigue. Reports this was low was due to taking Novolog with no food. Glucometer readings range from 110s-300s with a high of 440. Additionally, patient reports continued pain in both lower legs even after running out of Crestor for 2 weeks. Requested refills for Crestor and pen-needles.  Family/Social History: - Family hx:no pertinent positives - Tobacco use: Current every day smoker - 0.25 ppd - Alcohol AOZ:HYQM  Insurance coverage/medication affordability:[employer] Medicare  Medication adherence reported excellent.   Current diabetes medications include:  Lantus50units daily  Novolog 5 units BID w/meals   Victoza1.8mg  daily  Metformin 1000 mg BID  Current hypertension medications include:Losartan-HCTZ 100-25 mg daily Current hyperlipidemia medications include:Rosuvastatin 40 mg daily  Patient reports one hypoglycemic event. Experienced increased hunger and fatigue.  Patient reported dietary habits: Eats77meals/day (lunch and dinner) -Eats small portions -Rice, vegetables, fish, limits meat, fruit -Drinks water daily, sometimes soda, juice, and energy drinks when he feels tired   Patient denies nocturia (nighttime urination).  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   O:  POCT: 251 (post-prandial)  Listed from  10/27 to 9/13  Home fasting blood sugars: 209, 144, 390  2 hour post-meal/random blood sugars: 267, 120, 93, 199, 356, 55, 112, 131, 245, 374, 223, 232, 440, 233  Lab Results  Component Value Date   HGBA1C 12.7 (A) 11/11/2019   There were no vitals filed for this visit.  Lipid Panel     Component Value Date/Time   CHOL 130 11/11/2019 1446   TRIG 84 11/11/2019 1446   HDL 37 (L) 11/11/2019 1446   CHOLHDL 3.5 11/11/2019 1446   CHOLHDL 5.7 01/22/2010 0530   VLDL 14 01/22/2010 0530   LDLCALC 77 11/11/2019 1446    Clinical Atherosclerotic Cardiovascular Disease (ASCVD): No  The 10-year ASCVD risk score Denman George DC Jr., et al., 2013) is: 32.9%   Values used to calculate the score:     Age: 69 years     Sex: Male     Is Non-Hispanic African American: No     Diabetic: Yes     Tobacco smoker: Yes     Systolic Blood Pressure: 115 mmHg     Is BP treated: Yes     HDL Cholesterol: 37 mg/dL     Total Cholesterol: 130 mg/dL    A/P: Diabetes longstanding currently uncontrolled with A1C of 12.7%. Medication adherence appears optimial, however home sugars remain elevated primarily in the 200-300s. Will increase Lantus to 54 units daily and increase Novolog to 7 units twice daily with meals. Extensively counseled patient to skip Novolog if he skips meals to reduce risk of hypoglycemia. Patient verbalized understanding. Patient able to verbalize appropriate hypoglycemia management plan.  -Increased Lantus from 50 units to 54 units daily -Increased Novolog from 5 units to 7 units twice daily with meals -Continued Victoza 1.8 mg daily -  Continued Metformin 1000 mg BID -Extensively discussed pathophysiology of diabetes, recommended lifestyle interventions, dietary effects on blood sugar control -Counseled on s/sx of and management of hypoglycemia -Next A1C anticipated January 2022.   ASCVD risk - primary prevention in patient with diabetesand history of CAD.Last LDLiscontrolled at 61.  ASCVD  risk scoreis>20% - highintensity statin indicated. -Continued Rosuvastatin 40 mg daily - provided refills  Written patient instructions provided. Total time in face to face counseling 35 minutes.   Follow up Pharmacy Clinic Visit in 1 week.    Fabio Neighbors, PharmD PGY2 Ambulatory Care Resident [facility]  Pharmacy

## 2019-12-02 ENCOUNTER — Other Ambulatory Visit: Payer: Self-pay | Admitting: Family Medicine

## 2019-12-02 ENCOUNTER — Ambulatory Visit: Payer: Medicare HMO | Attending: Primary Care | Admitting: Pharmacist

## 2019-12-02 ENCOUNTER — Other Ambulatory Visit: Payer: Self-pay

## 2019-12-02 DIAGNOSIS — E119 Type 2 diabetes mellitus without complications: Secondary | ICD-10-CM

## 2019-12-02 DIAGNOSIS — Z794 Long term (current) use of insulin: Secondary | ICD-10-CM

## 2019-12-02 LAB — GLUCOSE, POCT (MANUAL RESULT ENTRY): POC Glucose: 251 mg/dl — AB (ref 70–99)

## 2019-12-02 MED ORDER — TRUEPLUS PEN NEEDLES 32G X 4 MM MISC: 2 refills | Status: DC

## 2019-12-02 MED ORDER — LANTUS SOLOSTAR 100 UNIT/ML ~~LOC~~ SOPN: 54.0000 [IU] | PEN_INJECTOR | Freq: Every day | SUBCUTANEOUS | 2 refills | Status: DC

## 2019-12-02 MED ORDER — ROSUVASTATIN CALCIUM 40 MG PO TABS
40.0000 mg | ORAL_TABLET | Freq: Every day | ORAL | 2 refills | Status: DC
Start: 2019-12-02 — End: 2019-12-25

## 2019-12-02 MED ORDER — NOVOLOG FLEXPEN 100 UNIT/ML ~~LOC~~ SOPN: 7.0000 [IU] | PEN_INJECTOR | Freq: Two times a day (BID) | SUBCUTANEOUS | 3 refills | Status: DC

## 2019-12-02 MED FILL — TECHLITE PEN NDL 32GX5/32: 32G X 4 MM | 25 days supply | Qty: 100 | Fill #0

## 2019-12-02 MED FILL — ROSUVASTATIN CALCIUM 40 MG: 40 | 30 days supply | Qty: 30 | Fill #0

## 2019-12-07 NOTE — Progress Notes (Signed)
S:    PCP: Gwinda Passe, NP PMH: CAD, T2DM, HLD, HTN  Patient arrives in good spirits with an in-person interpreter for his 1-week diabetes follow-up visit.  Patient was referred and last seen by Primary Care Provider on 09/16/19 and last seen in pharmacy clinic on 12/02/19. At that visit, Lantus was increased from 50 units to 54 units daily and Novolog was increased from 5 to 7 units BID w/meals.  Today, patient reports medication adherence with metformin 1000 mg BID, Lantus 54 units daily, Victoza 1.8 mg daily, and Novolog 7 units BID w/meals. Denies any sugars <70 or symptomatic hypoglycemia. Glucometer post-prandial readings from the past week were 72, 73, 101, 267. Patient reports being busy in the mornings and not checking fasting sugars.  Family/Social History: - Family hx:no pertinent positives - Tobacco use: Current every day smoker - 0.25 ppd - Alcohol CVE:LFYB  Insurance coverage/medication affordability:[employer] Medicare  Medication adherence reported excellent .   Current diabetes medications include:   Lantus54units daily  Novolog 7 units BID w/meals   Victoza1.8mg  daily  Metformin 1000 mg BID Current hypertension medications include:Losartan-HCTZ 100-25 mg daily Current hyperlipidemia medications include:Rosuvastatin 40 mg daily  Patient denies hypoglycemic events.  Patient reported dietary habits: Eats59meals/day (lunch and dinner) -Eats small portions -Rice, vegetables, fish, limits meat, fruit -Drinks water daily, sometimes soda, juice, and energy drinks when he feels tired   Patient denies nocturia (nighttime urination).  Patient reports neuropathy (nerve pain)   O:  POCT: 73 (post prandial)  Home fasting blood sugars: none 2 hour post-meal/random blood sugars: 72, 73, 101, 267  Lab Results  Component Value Date   HGBA1C 12.7 (A) 11/11/2019   There were no vitals filed for this visit.  Lipid Panel     Component Value  Date/Time   CHOL 130 11/11/2019 1446   TRIG 84 11/11/2019 1446   HDL 37 (L) 11/11/2019 1446   CHOLHDL 3.5 11/11/2019 1446   CHOLHDL 5.7 01/22/2010 0530   VLDL 14 01/22/2010 0530   LDLCALC 77 11/11/2019 1446   Clinical Atherosclerotic Cardiovascular Disease (ASCVD): No  The 10-year ASCVD risk score Denman George DC Jr., et al., 2013) is: 32.9%   Values used to calculate the score:     Age: 57 years     Sex: Male     Is Non-Hispanic African American: No     Diabetic: Yes     Tobacco smoker: Yes     Systolic Blood Pressure: 115 mmHg     Is BP treated: Yes     HDL Cholesterol: 37 mg/dL     Total Cholesterol: 130 mg/dL   A/P: Diabetes longstanding currently uncontrolled with A1C of 12.7%, however sugars are improving since increasing Lantus and Novolog at last visit. Medication adherence appears optimial. Will make no medication changes today. Encouraged patient to check fasting BG in the mornings in addition to evenings. Patient able to verbalize appropriate hypoglycemia management plan.  -Continued Lantus 54 units daily -Continued Novolog 7 units twice daily with meals -Continued Victoza 1.8 mg daily -Continued Metformin 1000 mg BID -Extensively discussed pathophysiology of diabetes, recommended lifestyle interventions, dietary effects on blood sugar control -Counseled on s/sx of and management of hypoglycemia -Next A1C anticipatedJanuary2022.  ASCVD risk - primary prevention in patient with diabetesand history of CAD.Last LDLiscontrolled at 13.  ASCVD risk scoreis>20% - highintensity statin indicated. -ContinuedRosuvastatin 40 mg daily   Written patient instructions provided.  Total time in face to face counseling 35 minutes.   Follow  up Pharmacist Clinic Visit in 1 month and PCP in 1 week.    Fabio Neighbors, PharmD PGY2 Ambulatory Care Resident [facility]  Pharmacy

## 2019-12-09 ENCOUNTER — Other Ambulatory Visit: Payer: Self-pay

## 2019-12-09 ENCOUNTER — Ambulatory Visit: Payer: Medicare HMO | Attending: Primary Care | Admitting: Pharmacist

## 2019-12-09 DIAGNOSIS — Z794 Long term (current) use of insulin: Secondary | ICD-10-CM

## 2019-12-09 DIAGNOSIS — E119 Type 2 diabetes mellitus without complications: Secondary | ICD-10-CM

## 2019-12-09 MED FILL — LANTUS SOLOSTAR 100 UNITS/M: 100 | 30 days supply | Qty: 15 | Fill #1

## 2019-12-10 LAB — GLUCOSE, POCT (MANUAL RESULT ENTRY): POC Glucose: 73 mg/dl (ref 70–99)

## 2019-12-17 ENCOUNTER — Ambulatory Visit (INDEPENDENT_AMBULATORY_CARE_PROVIDER_SITE_OTHER): Payer: Medicare HMO | Admitting: Primary Care

## 2019-12-17 ENCOUNTER — Other Ambulatory Visit (INDEPENDENT_AMBULATORY_CARE_PROVIDER_SITE_OTHER): Payer: Self-pay | Admitting: Primary Care

## 2019-12-17 ENCOUNTER — Encounter (INDEPENDENT_AMBULATORY_CARE_PROVIDER_SITE_OTHER): Payer: Self-pay | Admitting: Primary Care

## 2019-12-17 ENCOUNTER — Other Ambulatory Visit: Payer: Self-pay

## 2019-12-17 VITALS — BP 180/87 | HR 79 | Temp 97.5°F | Wt 143.6 lb

## 2019-12-17 DIAGNOSIS — E1142 Type 2 diabetes mellitus with diabetic polyneuropathy: Secondary | ICD-10-CM

## 2019-12-17 DIAGNOSIS — I10 Essential (primary) hypertension: Secondary | ICD-10-CM

## 2019-12-17 DIAGNOSIS — E782 Mixed hyperlipidemia: Secondary | ICD-10-CM | POA: Diagnosis not present

## 2019-12-17 DIAGNOSIS — Z79899 Other long term (current) drug therapy: Secondary | ICD-10-CM

## 2019-12-17 DIAGNOSIS — Z23 Encounter for immunization: Secondary | ICD-10-CM

## 2019-12-17 DIAGNOSIS — Z794 Long term (current) use of insulin: Secondary | ICD-10-CM | POA: Diagnosis not present

## 2019-12-17 MED ORDER — LOSARTAN POTASSIUM-HCTZ 100-25 MG PO TABS: 1.0000 | ORAL_TABLET | Freq: Every day | ORAL | 3 refills | Status: DC

## 2019-12-17 MED FILL — LOSARTAN-HCTZ 100-25 MG TAB: 100-25 | 90 days supply | Qty: 90 | Fill #0

## 2019-12-17 NOTE — Patient Instructions (Signed)

## 2019-12-17 NOTE — Progress Notes (Signed)
Established Patient Office Visit  Subjective:  Patient ID: Colin [name], male    DOB: 11/10/1950  Age: 69 y.o. MRN: [id number]  CC:  Chief Complaint  Patient presents with  . Blood Pressure Check    HPI [name] is a 69 year old [place]  who speaks Dega interpretor Yeko . He never picked up his Bp medication. Presented with all medication and had patient to write on each bottle what they are taken for.   Past Medical History:  Diagnosis Date  . Arrhythmia   . Carotid artery occlusion   . Diabetes mellitus     Past Surgical History:  Procedure Laterality Date  . CARDIAC CATHETERIZATION     01/2010 ; 04/2010    No family history on file.  Social History   Socioeconomic History  . Marital status: Married    Spouse name: Not on file  . Number of children: Not on file  . Years of education: Not on file  . Highest education level: Not on file  Occupational History  . Not on file  Tobacco Use  . Smoking status: Current Every Day Smoker    Packs/day: 0.25    Types: Cigarettes  . Smokeless tobacco: Never Used  Substance and Sexual Activity  . Alcohol use: No  . Drug use: No  . Sexual activity: Not on file  Other Topics Concern  . Not on file  Social History Narrative  . Not on file   Social Determinants of Health   Financial Resource Strain:   . Difficulty of Paying Living Expenses: Not on file  Food Insecurity:   . Worried About Charity fundraiser in the Last Year: Not on file  . Ran Out of Food in the Last Year: Not on file  Transportation Needs:   . Lack of Transportation (Medical): Not on file  . Lack of Transportation (Non-Medical): Not on file  Physical Activity:   . Days of Exercise per Week: Not on file  . Minutes of Exercise per Session: Not on file  Stress:   . Feeling of Stress : Not on file  Social Connections:   . Frequency of Communication with Friends and Family: Not on file  . Frequency of Social Gatherings with Friends and  Family: Not on file  . Attends Religious Services: Not on file  . Active Member of Clubs or Organizations: Not on file  . Attends Archivist Meetings: Not on file  . Marital Status: Not on file  Intimate Partner Violence:   . Fear of Current or Ex-Partner: Not on file  . Emotionally Abused: Not on file  . Physically Abused: Not on file  . Sexually Abused: Not on file    Outpatient Medications Prior to Visit  Medication Sig Dispense Refill  . Accu-Chek Softclix Lancets lancets Use to check blood sugar three times daily. 100 each 6  . Blood Glucose Monitoring Suppl (ACCU-CHEK GUIDE ME) w/Device KIT Use to check blood sugar three times daily. 1 kit 0  . gabapentin (NEURONTIN) 100 MG capsule Take 1 capsule (100 mg total) by mouth 3 (three) times daily. 90 capsule 3  . glucose blood (ACCU-CHEK GUIDE) test strip Use to check blood sugar three times daily. 100 each 6  . insulin aspart (NOVOLOG FLEXPEN) 100 UNIT/ML FlexPen Inject 7 Units into the skin 2 (two) times daily with a meal. 15 mL 3  . insulin glargine (LANTUS SOLOSTAR) 100 UNIT/ML Solostar Pen Inject 54 Units  into the skin daily. 15 mL 2  . Insulin Pen Needle (TRUEPLUS PEN NEEDLES) 32G X 4 MM MISC Use as instructed to inject Lantus and Victoza once daily. 100 each 2  . liraglutide (VICTOZA) 18 MG/3ML SOPN Inject 1.8 mg into the skin daily. 9 mL 2  . metFORMIN (GLUCOPHAGE) 1000 MG tablet Take 1 tablet (1,000 mg total) by mouth 2 (two) times daily with a meal. 60 tablet 2  . rosuvastatin (CRESTOR) 40 MG tablet Take 1 tablet (40 mg total) by mouth daily. 30 tablet 2  . losartan-hydrochlorothiazide (HYZAAR) 100-25 MG tablet Take 1 tablet by mouth daily. 90 tablet 3  . aspirin 81 MG tablet Take 81 mg by mouth daily.   (Patient not taking: Reported on 08/12/2019)    . ranitidine (ZANTAC) 150 MG tablet Take 150 mg by mouth as needed.   (Patient not taking: Reported on 08/12/2019)     No facility-administered medications prior to visit.     No Known Allergies  ROS Review of Systems  All other systems reviewed and are negative.     Objective:    BP (!) 180/87 (BP Location: Right Arm, Patient Position: Sitting, Cuff Size: Normal)   Pulse 79   Temp (!) 97.5 F (36.4 C) (Temporal)   Wt 143 lb 9.6 oz (65.1 kg)   SpO2 97%   BMI 23.18 kg/m    Physical Exam Vitals reviewed.  HENT:     Head: Normocephalic.     Nose: Nose normal.  Eyes:     Extraocular Movements: Extraocular movements intact.  Cardiovascular:     Rate and Rhythm: Normal rate and regular rhythm.  Pulmonary:     Effort: Pulmonary effort is normal.     Breath sounds: Normal breath sounds.  Abdominal:     General: Bowel sounds are normal.     Palpations: Abdomen is soft.  Musculoskeletal:        General: Normal range of motion.     Cervical back: Normal range of motion.  Skin:    General: Skin is warm and dry.  Neurological:     Mental Status: He is alert and oriented to person, place, and time.  Psychiatric:        Mood and Affect: Mood normal.        Behavior: Behavior normal.        Thought Content: Thought content normal.        Judgment: Judgment normal.       Health Maintenance Due  Topic Date Due  . Hepatitis C Screening  Never done  . OPHTHALMOLOGY EXAM  Never done  . COVID-19 Vaccine (1) Never done  . COLONOSCOPY  Never done  . PNA vac Low Risk Adult (1 of 2 - PCV13) Never done    There are no preventive care reminders to display for this patient.  Lab Results  Component Value Date   TSH 3.058 01/22/2010   Lab Results  Component Value Date   WBC 6.6 12/17/2019   HGB 12.5 (L) 12/17/2019   HCT 38.6 12/17/2019   MCV 94 12/17/2019   PLT 276 12/17/2019   Lab Results  Component Value Date   NA 142 12/17/2019   K 4.7 12/17/2019   CO2 27 12/17/2019   GLUCOSE 112 (H) 12/17/2019   BUN 23 12/17/2019   CREATININE 1.15 12/17/2019   BILITOT <0.2 12/17/2019   ALKPHOS 82 12/17/2019   AST 12 12/17/2019   ALT 23  12/17/2019   PROT 6.8  12/17/2019   ALBUMIN 4.0 12/17/2019   CALCIUM 9.5 12/17/2019   ANIONGAP 12 09/30/2019   Lab Results  Component Value Date   CHOL 171 12/17/2019   Lab Results  Component Value Date   HDL 41 12/17/2019   Lab Results  Component Value Date   LDLCALC 114 (H) 12/17/2019   Lab Results  Component Value Date   TRIG 88 12/17/2019   Lab Results  Component Value Date   CHOLHDL 4.2 12/17/2019   Lab Results  Component Value Date   HGBA1C 12.7 (A) 11/11/2019      Assessment & Plan:  Jovon was seen today for blood pressure check.  Diagnoses and all orders for this visit:  Essential hypertension Counseled on blood pressure goal of less than 130/80, low-sodium, DASH diet, medication compliance, 150 minutes of moderate intensity exercise per week. Discussed medication compliance, adverse effects. -     losartan-hydrochlorothiazide (HYZAAR) 100-25 MG tablet; Take 1 tablet by mouth daily. -     CMP14+EGFR  Medication management Reviewed all medication dosage and how many time to take and had him write the information on each bottles  Type 2 diabetes mellitus with diabetic polyneuropathy, with long-term current use of insulin ([place]) Followed by Clinical pharmacy  -     CBC with Differential  Mixed hyperlipidemia -     Lipid Panel  Need for immunization against influenza -     Flu Vaccine QUAD 36+ mos IM    Follow-up: Return in about 6 weeks (around 01/28/2020) for Bp ck .    Kerin Perna, NP

## 2019-12-18 LAB — CMP14+EGFR
ALT: 23 IU/L (ref 0–44)
AST: 12 IU/L (ref 0–40)
Albumin/Globulin Ratio: 1.4 (ref 1.2–2.2)
Albumin: 4 g/dL (ref 3.8–4.8)
Alkaline Phosphatase: 82 IU/L (ref 44–121)
BUN/Creatinine Ratio: 20 (ref 10–24)
BUN: 23 mg/dL (ref 8–27)
Bilirubin Total: <0.2 mg/dL (ref 0.0–1.2)
CO2: 27 mmol/L (ref 20–29)
Calcium: 9.5 mg/dL (ref 8.6–10.2)
Chloride: 104 mmol/L (ref 96–106)
Creatinine, Ser: 1.15 mg/dL (ref 0.76–1.27)
GFR calc Af Amer: 75 mL/min/{1.73_m2} (ref >59)
GFR calc non Af Amer: 65 mL/min/{1.73_m2} (ref >59)
Globulin, Total: 2.8 g/dL (ref 1.5–4.5)
Glucose: 112 mg/dL — ABNORMAL HIGH (ref 65–99)
Potassium: 4.7 mmol/L (ref 3.5–5.2)
Sodium: 142 mmol/L (ref 134–144)
Total Protein: 6.8 g/dL (ref 6.0–8.5)

## 2019-12-18 LAB — CBC WITH DIFFERENTIAL/PLATELET
Basophils Absolute: 0.1 10*3/uL (ref 0.0–0.2)
Basos: 1 %
EOS (ABSOLUTE): 0.4 10*3/uL (ref 0.0–0.4)
Eos: 6 %
Hematocrit: 38.6 % (ref 37.5–51.0)
Hemoglobin: 12.5 g/dL — ABNORMAL LOW (ref 13.0–17.7)
Immature Grans (Abs): 0 10*3/uL (ref 0.0–0.1)
Immature Granulocytes: 0 %
Lymphocytes Absolute: 1.4 10*3/uL (ref 0.7–3.1)
Lymphs: 22 %
MCH: 30.5 pg (ref 26.6–33.0)
MCHC: 32.4 g/dL (ref 31.5–35.7)
MCV: 94 fL (ref 79–97)
Monocytes Absolute: 0.6 10*3/uL (ref 0.1–0.9)
Monocytes: 9 %
Neutrophils Absolute: 4.1 10*3/uL (ref 1.4–7.0)
Neutrophils: 62 %
Platelets: 276 10*3/uL (ref 150–450)
RBC: 4.1 x10E6/uL — ABNORMAL LOW (ref 4.14–5.80)
RDW: 13.4 % (ref 11.6–15.4)
WBC: 6.6 10*3/uL (ref 3.4–10.8)

## 2019-12-18 LAB — LIPID PANEL
Chol/HDL Ratio: 4.2 ratio (ref 0.0–5.0)
Cholesterol, Total: 171 mg/dL (ref 100–199)
HDL: 41 mg/dL (ref >39)
LDL Chol Calc (NIH): 114 mg/dL — ABNORMAL HIGH (ref 0–99)
Triglycerides: 88 mg/dL (ref 0–149)
VLDL Cholesterol Cal: 16 mg/dL (ref 5–40)

## 2019-12-25 ENCOUNTER — Other Ambulatory Visit (INDEPENDENT_AMBULATORY_CARE_PROVIDER_SITE_OTHER): Payer: Self-pay | Admitting: Primary Care

## 2019-12-25 DIAGNOSIS — E782 Mixed hyperlipidemia: Secondary | ICD-10-CM

## 2019-12-25 MED ORDER — ROSUVASTATIN CALCIUM 20 MG PO TABS: 20.0000 mg | ORAL_TABLET | Freq: Every day | ORAL | 1 refills | Status: DC

## 2019-12-28 MED FILL — NOVOLOG FLEXPEN SYRINGE: 100 | 30 days supply | Qty: 3 | Fill #1

## 2019-12-28 MED FILL — ROSUVASTATIN CALCIUM 20 MG: 20 | 90 days supply | Qty: 90 | Fill #0

## 2019-12-28 MED FILL — VICTOZA 18 MG/3 ML INJECT P: 18 | 30 days supply | Qty: 9 | Fill #2

## 2020-01-06 ENCOUNTER — Telehealth (INDEPENDENT_AMBULATORY_CARE_PROVIDER_SITE_OTHER): Payer: Self-pay

## 2020-01-06 MED FILL — GABAPENTIN 100 MG CAPSULE: 100 | 30 days supply | Qty: 90 | Fill #2

## 2020-01-06 NOTE — Telephone Encounter (Signed)
Per DPR left voicemail notifying patients son that patients Cholesterol improved decreased pravastatin from 40mg  to 20mg  Decrease your fatty foods, red meat, cheese, milk and increase fiber like whole grains and veggies. Return call to [facility] with any questions or concerns. , CMA

## 2020-01-06 NOTE — Telephone Encounter (Signed)
-----   Message from Grayce Sessions, NP sent at 12/25/2019  8:02 PM EST ----- Cholesterol improved decreased pravastatin from 40mg  to 20mg  Decrease your fatty foods, red meat, cheese, milk and increase fiber like whole grains and veggies.

## 2020-01-08 ENCOUNTER — Encounter (INDEPENDENT_AMBULATORY_CARE_PROVIDER_SITE_OTHER): Payer: Self-pay

## 2020-01-08 ENCOUNTER — Ambulatory Visit: Payer: Medicare HMO | Admitting: Pharmacist

## 2020-01-08 ENCOUNTER — Other Ambulatory Visit: Payer: Self-pay

## 2020-01-27 ENCOUNTER — Encounter (INDEPENDENT_AMBULATORY_CARE_PROVIDER_SITE_OTHER): Payer: Self-pay | Admitting: Primary Care

## 2020-01-27 ENCOUNTER — Other Ambulatory Visit: Payer: Self-pay

## 2020-01-27 ENCOUNTER — Ambulatory Visit (INDEPENDENT_AMBULATORY_CARE_PROVIDER_SITE_OTHER): Payer: Medicare HMO | Admitting: Primary Care

## 2020-01-27 VITALS — BP 148/70 | HR 93 | Temp 97.2°F | Ht 66.0 in | Wt 139.0 lb

## 2020-01-27 DIAGNOSIS — R739 Hyperglycemia, unspecified: Secondary | ICD-10-CM | POA: Diagnosis not present

## 2020-01-27 DIAGNOSIS — E119 Type 2 diabetes mellitus without complications: Secondary | ICD-10-CM | POA: Diagnosis not present

## 2020-01-27 DIAGNOSIS — I10 Essential (primary) hypertension: Secondary | ICD-10-CM | POA: Diagnosis not present

## 2020-01-27 LAB — POCT URINALYSIS DIP (CLINITEK)
Bilirubin, UA: NEGATIVE
Glucose, UA: >=1000 mg/dL — AB
Ketones, POC UA: NEGATIVE mg/dL
Leukocytes, UA: NEGATIVE
Nitrite, UA: NEGATIVE
POC PROTEIN,UA: >=300 — AB
Spec Grav, UA: 1.025 (ref 1.010–1.025)
Urobilinogen, UA: 0.2 E.U./dL
pH, UA: 6 (ref 5.0–8.0)

## 2020-01-27 LAB — GLUCOSE, POCT (MANUAL RESULT ENTRY): POC Glucose: 343 mg/dl — AB (ref 70–99)

## 2020-01-27 NOTE — Progress Notes (Signed)
Gordon for  hypertension evaluation, on previous visit medication was adjusted to include Losartan/HCTZ 100/25  Patient reports adherence with medications.  Current Medication List Current Outpatient Medications on File Prior to Visit  Medication Sig Dispense Refill  . Accu-Chek Softclix Lancets lancets Use to check blood sugar three times daily. 100 each 6  . Blood Glucose Monitoring Suppl (ACCU-CHEK GUIDE ME) w/Device KIT Use to check blood sugar three times daily. 1 kit 0  . gabapentin (NEURONTIN) 100 MG capsule Take 1 capsule (100 mg total) by mouth 3 (three) times daily. 90 capsule 3  . glucose blood (ACCU-CHEK GUIDE) test strip Use to check blood sugar three times daily. 100 each 6  . insulin aspart (NOVOLOG FLEXPEN) 100 UNIT/ML FlexPen Inject 7 Units into the skin 2 (two) times daily with a meal. 15 mL 3  . insulin glargine (LANTUS SOLOSTAR) 100 UNIT/ML Solostar Pen Inject 54 Units into the skin daily. 15 mL 2  . Insulin Pen Needle (TRUEPLUS PEN NEEDLES) 32G X 4 MM MISC Use as instructed to inject Lantus and Victoza once daily. 100 each 2  . liraglutide (VICTOZA) 18 MG/3ML SOPN Inject 1.8 mg into the skin daily. 9 mL 2  . losartan-hydrochlorothiazide (HYZAAR) 100-25 MG tablet Take 1 tablet by mouth daily. 90 tablet 3  . metFORMIN (GLUCOPHAGE) 1000 MG tablet Take 1 tablet (1,000 mg total) by mouth 2 (two) times daily with a meal. 60 tablet 2  . rosuvastatin (CRESTOR) 20 MG tablet Take 1 tablet (20 mg total) by mouth daily. 90 tablet 1  . aspirin 81 MG tablet Take 81 mg by mouth daily.   (Patient not taking: No sig reported)     No current facility-administered medications on file prior to visit.   Past Medical History   Dietary habits include:trying to watch what he eats  Exercise habits include:walking Family / Social history:unknown  ASCVD risk factors include- [place]  O:  Physical Exam Vitals reviewed.  Constitutional:      Appearance:  Normal appearance. He is normal weight.  HENT:     Right Ear: External ear normal.     Left Ear: External ear normal.     Nose: Nose normal.  Eyes:     Extraocular Movements: Extraocular movements intact.  Cardiovascular:     Rate and Rhythm: Normal rate and regular rhythm.  Pulmonary:     Effort: Pulmonary effort is normal.     Breath sounds: Normal breath sounds.  Abdominal:     General: Bowel sounds are normal.     Palpations: Abdomen is soft.  Musculoskeletal:        General: Normal range of motion.     Cervical back: Normal range of motion.  Skin:    General: Skin is warm and dry.  Neurological:     Mental Status: He is alert and oriented to person, place, and time.  Psychiatric:        Mood and Affect: Mood normal.        Behavior: Behavior normal.        Thought Content: Thought content normal.        Judgment: Judgment normal.      ROS  Last 3 Office BP readings: BP Readings from Last 3 Encounters:  01/27/20 (!) 148/70  12/17/19 (!) 180/87  10/07/19 115/70    BMET    Component Value Date/Time   NA 142 12/17/2019 1101   K 4.7 12/17/2019 1101   CL  104 12/17/2019 1101   CO2 27 12/17/2019 1101   GLUCOSE 112 (H) 12/17/2019 1101   GLUCOSE 484 (H) 09/30/2019 1627   BUN 23 12/17/2019 1101   CREATININE 1.15 12/17/2019 1101   CALCIUM 9.5 12/17/2019 1101   GFRNONAA 65 12/17/2019 1101   GFRAA 75 12/17/2019 1101    Renal function: CrCl cannot be calculated (Patient's most recent lab result is older than the maximum 21 days allowed.).  Clinical ASCVD: Yes  The 10-year ASCVD risk score Mikey Bussing DC Brooke Bonito., et al., 2013) is: 50.2%   Values used to calculate the score:     Age: 52 years     Sex: Male     Is Non-Hispanic African American: No     Diabetic: Yes     Tobacco smoker: Yes     Systolic Blood Pressure: 438 mmHg     Is BP treated: Yes     HDL Cholesterol: 41 mg/dL     Total Cholesterol: 171 mg/dL   A/P:Dent was seen today for blood pressure  check.  Diagnoses and all orders for this visit:  Type 2 diabetes mellitus without complication, with long-term current use of insulin (HCC) Uncontrolled 2 months prior A1C 12.7. Medication adjusted and repeat A1C in 1 month. Each visit will remind of Complications from uncontrolled diabetes -diabetic retinopathy leading to blindness, diabetic nephropathy leading to dialysis, decrease in circulation decrease in sores or wound healing which may lead to amputations and increase of heart attack and stroke -     Glucose (CBG)  Essential hypertension Hypertension longstanding currently losartan/HCTZ 100/25 mg daily  on current medications. BP Goal = 130/80 mmHg. Patient is adherent with current medications.  -Continued  -F/u labs ordered - none -Counseled on lifestyle modifications for blood pressure control including reduced dietary sodium, increased exercise, adequate sleep  Kerin Perna

## 2020-02-08 MED FILL — METFORMIN HCL 1000 MG TABS: 1000 | 30 days supply | Qty: 60 | Fill #2

## 2020-02-08 MED FILL — GABAPENTIN 100 MG CAPSULE: 100 | 30 days supply | Qty: 90 | Fill #3

## 2020-02-12 ENCOUNTER — Other Ambulatory Visit: Payer: Self-pay | Admitting: Family Medicine

## 2020-02-12 ENCOUNTER — Ambulatory Visit: Payer: Medicare HMO | Attending: Primary Care | Admitting: Pharmacist

## 2020-02-12 ENCOUNTER — Encounter: Payer: Self-pay | Admitting: Pharmacist

## 2020-02-12 ENCOUNTER — Telehealth: Payer: Self-pay | Admitting: Pharmacist

## 2020-02-12 ENCOUNTER — Other Ambulatory Visit: Payer: Self-pay

## 2020-02-12 DIAGNOSIS — E0842 Diabetes mellitus due to underlying condition with diabetic polyneuropathy: Secondary | ICD-10-CM | POA: Diagnosis not present

## 2020-02-12 DIAGNOSIS — Z794 Long term (current) use of insulin: Secondary | ICD-10-CM

## 2020-02-12 DIAGNOSIS — E119 Type 2 diabetes mellitus without complications: Secondary | ICD-10-CM

## 2020-02-12 LAB — POCT GLYCOSYLATED HEMOGLOBIN (HGB A1C): Hemoglobin A1C: 11 % — AB (ref 4.0–5.6)

## 2020-02-12 MED ORDER — GABAPENTIN 100 MG PO CAPS: 100.0000 mg | ORAL_CAPSULE | Freq: Three times a day (TID) | ORAL | 1 refills | Status: DC

## 2020-02-12 NOTE — Progress Notes (Signed)
    S:    PCP: Gwinda Passe, NP  PMH: CAD, T2DM, HLD, HTN  Patient arrives in good spirits. Patient was referred and last seen by Primary Care Provider on 12/22/202.   Today, patient reports medication adherence.   Family/Social History: - Family hx:no pertinent positives - Tobacco use: Current every day smoker - 0.25 ppd - Alcohol MKL:KJZP  Insurance coverage/medication affordability:[employer]  Medication adherence reported.   Current diabetes medications include:   Lantus54units daily  Novolog 7 units BID w/meals   Victoza1.8mg  daily  Metformin 1000 mg BID Current hypertension medications include:Losartan-HCTZ 100-25 mg daily Current hyperlipidemia medications include:Rosuvastatin 40 mg daily  Patient denies hypoglycemic events.  Patient reported dietary habits: Eats73meals/day (lunch and dinner) -Eats small portions -Rice, vegetables, fish, limits meat, fruit -Drinks water daily, sometimes soda, juice, and energy drinks when he feels tired   Patient denies nocturia (nighttime urination).  Patient reports neuropathy (nerve pain)   O:   Home fasting blood sugars: no meter. Reports that his home CBGs range in the 200-300s.   Lab Results  Component Value Date   HGBA1C 11.0 (A) 02/12/2020   There were no vitals filed for this visit.  Lipid Panel     Component Value Date/Time   CHOL 171 12/17/2019 1101   TRIG 88 12/17/2019 1101   HDL 41 12/17/2019 1101   CHOLHDL 4.2 12/17/2019 1101   CHOLHDL 5.7 01/22/2010 0530   VLDL 14 01/22/2010 0530   LDLCALC 114 (H) 12/17/2019 1101   Clinical Atherosclerotic Cardiovascular Disease (ASCVD): No  The 10-year ASCVD risk score Denman George DC Jr., et al., 2013) is: 50.2%   Values used to calculate the score:     Age: 51 years     Sex: Male     Is Non-Hispanic African American: No     Diabetic: Yes     Tobacco smoker: Yes     Systolic Blood Pressure: 148 mmHg     Is BP treated: Yes     HDL  Cholesterol: 41 mg/dL     Total Cholesterol: 171 mg/dL   A/P: Diabetes longstanding currently uncontrolled. A1c today down to 11.0 from 12.7. Medication adherence appears optimial.. Patient able to verbalize appropriate hypoglycemia management plan.  -Continued current regimen. -Message sent to patient's PCP for Endo referral.  -Extensively discussed pathophysiology of diabetes, recommended lifestyle interventions, dietary effects on blood sugar control -Counseled on s/sx of and management of hypoglycemia -Next A1C anticipatedJanuary2022.  ASCVD risk - primary prevention in patient with diabetesand history of CAD.Last LDLiscontrolled at 70.  ASCVD risk scoreis>20% - highintensity statin indicated. -ContinuedRosuvastatin 40 mg daily   Written patient instructions provided.  Total time in face to face counseling 35 minutes.   Follow up with Endo/PCP.   Butch Penny, PharmD, P[place]y [place], CPP Clinical Pharmacist [facility] [phone]

## 2020-02-12 NOTE — Telephone Encounter (Signed)
Saw patient today. A1c down from 12.7 to 11.0. Discussed with patient the need for Endo referral. Can you submit this please?

## 2020-02-13 ENCOUNTER — Other Ambulatory Visit (INDEPENDENT_AMBULATORY_CARE_PROVIDER_SITE_OTHER): Payer: Self-pay | Admitting: Primary Care

## 2020-02-13 DIAGNOSIS — E1142 Type 2 diabetes mellitus with diabetic polyneuropathy: Secondary | ICD-10-CM

## 2020-03-07 ENCOUNTER — Other Ambulatory Visit: Payer: Self-pay

## 2020-03-07 ENCOUNTER — Ambulatory Visit (INDEPENDENT_AMBULATORY_CARE_PROVIDER_SITE_OTHER): Payer: Medicare HMO | Admitting: Primary Care

## 2020-03-07 ENCOUNTER — Encounter (INDEPENDENT_AMBULATORY_CARE_PROVIDER_SITE_OTHER): Payer: Self-pay | Admitting: Primary Care

## 2020-03-07 VITALS — BP 122/74 | HR 76 | Temp 97.2°F | Wt 132.2 lb

## 2020-03-07 DIAGNOSIS — Z23 Encounter for immunization: Secondary | ICD-10-CM

## 2020-03-07 DIAGNOSIS — Z1211 Encounter for screening for malignant neoplasm of colon: Secondary | ICD-10-CM

## 2020-03-07 DIAGNOSIS — E1142 Type 2 diabetes mellitus with diabetic polyneuropathy: Secondary | ICD-10-CM

## 2020-03-07 DIAGNOSIS — Z794 Long term (current) use of insulin: Secondary | ICD-10-CM | POA: Diagnosis not present

## 2020-03-07 LAB — GLUCOSE, POCT (MANUAL RESULT ENTRY): POC Glucose: 293 mg/dl — AB (ref 70–99)

## 2020-03-07 NOTE — Progress Notes (Signed)
Subjective:  Patient ID: Colin [name], male    DOB: Jul 22, 1950  Age: 70 y.o. MRN: [id number]  CC: Diabetes  Interpretor for [place]agn[place]-Yhin   HPI Kolson Chovanec PPJK presents for follow-up of diabetes. Patient does not check blood sugar at home  Compliant with meds - Yes Checking CBGs? Yes  Fasting avg - > 200 (states even if he does not eat anything   Exercising regularly? - No Watching carbohydrate intake? - Yes Neuropathy ? - Yes Hypoglycemic events - No  - Recovers with :   Pertinent ROS:  Polyuria - Yes Polydipsia - No Vision problems - No  Medications as noted below. Taking them regularly without complication/adverse reaction being reported today.   History Domanik has a past medical history of Arrhythmia, Carotid artery occlusion, and Diabetes mellitus.   He has a past surgical history that includes Cardiac catheterization.   His family history is not on file.He reports that he has been smoking cigarettes. He has been smoking about 0.25 packs per day. He has never used smokeless tobacco. He reports that he does not drink alcohol and does not use drugs.  Current Outpatient Medications on File Prior to Visit  Medication Sig Dispense Refill  . Accu-Chek Softclix Lancets lancets Use to check blood sugar three times daily. 100 each 6  . aspirin 81 MG tablet Take 81 mg by mouth daily.    . Blood Glucose Monitoring Suppl (ACCU-CHEK GUIDE ME) w/Device KIT Use to check blood sugar three times daily. 1 kit 0  . gabapentin (NEURONTIN) 100 MG capsule Take 1 capsule (100 mg total) by mouth 3 (three) times daily. 270 capsule 1  . glucose blood (ACCU-CHEK GUIDE) test strip Use to check blood sugar three times daily. 100 each 6  . insulin aspart (NOVOLOG FLEXPEN) 100 UNIT/ML FlexPen Inject 7 Units into the skin 2 (two) times daily with a meal. 15 mL 3  . insulin glargine (LANTUS SOLOSTAR) 100 UNIT/ML Solostar Pen Inject 54 Units into the skin daily. 15 mL 2  . Insulin Pen Needle (TRUEPLUS  PEN NEEDLES) 32G X 4 MM MISC Use as instructed to inject Lantus and Victoza once daily. 100 each 2  . liraglutide (VICTOZA) 18 MG/3ML SOPN Inject 1.8 mg into the skin daily. 9 mL 2  . losartan-hydrochlorothiazide (HYZAAR) 100-25 MG tablet Take 1 tablet by mouth daily. 90 tablet 3  . metFORMIN (GLUCOPHAGE) 1000 MG tablet Take 1 tablet (1,000 mg total) by mouth 2 (two) times daily with a meal. 60 tablet 2  . rosuvastatin (CRESTOR) 20 MG tablet Take 1 tablet (20 mg total) by mouth daily. 90 tablet 1   No current facility-administered medications on file prior to visit.    ROS Review of Systems  Constitutional: Positive for fatigue.  Endocrine: Positive for polyuria.  Neurological: Positive for numbness.  All other systems reviewed and are negative.   Objective:  BP 122/74 (BP Location: Right Arm, Patient Position: Sitting, Cuff Size: Normal)   Pulse 76   Temp (!) 97.2 F (36.2 C) (Temporal)   Wt 132 lb 3.2 oz (60 kg)   SpO2 99%   BMI 21.34 kg/m   BP Readings from Last 3 Encounters:  03/07/20 122/74  01/27/20 (!) 148/70  12/17/19 (!) 180/87    Wt Readings from Last 3 Encounters:  03/07/20 132 lb 3.2 oz (60 kg)  01/27/20 139 lb (63 kg)  12/17/19 143 lb 9.6 oz (65.1 kg)    Physical Exam Vitals reviewed.  Constitutional:  Appearance: Normal appearance.  HENT:     Right Ear: Tympanic membrane normal.     Left Ear: Tympanic membrane normal.     Nose: Nose normal.  Eyes:     Extraocular Movements: Extraocular movements intact.  Cardiovascular:     Rate and Rhythm: Normal rate and regular rhythm.  Pulmonary:     Effort: Pulmonary effort is normal.     Breath sounds: Normal breath sounds.  Abdominal:     General: Bowel sounds are normal.     Palpations: Abdomen is soft.  Musculoskeletal:        General: Normal range of motion.     Cervical back: Normal range of motion and neck supple.  Skin:    General: Skin is warm and dry.  Neurological:     Mental Status: He  is alert and oriented to person, place, and time.  Psychiatric:        Behavior: Behavior normal.     Lab Results  Component Value Date   HGBA1C 11.0 (A) 02/12/2020   HGBA1C 12.7 (A) 11/11/2019   HGBA1C 13.6 (A) 08/12/2019    Lab Results  Component Value Date   WBC 6.6 12/17/2019   HGB 12.5 (L) 12/17/2019   HCT 38.6 12/17/2019   PLT 276 12/17/2019   GLUCOSE 112 (H) 12/17/2019   CHOL 171 12/17/2019   TRIG 88 12/17/2019   HDL 41 12/17/2019   LDLCALC 114 (H) 12/17/2019   ALT 23 12/17/2019   AST 12 12/17/2019   NA 142 12/17/2019   K 4.7 12/17/2019   CL 104 12/17/2019   CREATININE 1.15 12/17/2019   BUN 23 12/17/2019   CO2 27 12/17/2019   TSH 3.058 01/22/2010   INR 0.93 01/21/2010   HGBA1C 11.0 (A) 02/12/2020     Assessment & Plan:   Sanjeev was seen today for diabetes.  Diagnoses and all orders for this visit:  Type 2 diabetes mellitus with diabetic polyneuropathy, with long-term current use of insulin (HCC) -     Glucose (CBG) when taking at home 65->200 but has not been check BS consistently . No change with broad range.  Goal of therapy: Less than 6.5 hemoglobin A1c. Monitor  foods that are high in carbohydrates are the following rice, potatoes, breads, sugars, and pastas.  Reduction in the intake (eating) will assist in lowering your blood sugars. Discussed  co- morbidities with uncontrol diabetes  Complications -diabetic retinopathy, (close your eyes ? What do you see nothing) nephropathy decrease in kidney function- can lead to dialysis-on a machine 3 days a week to filter your kidney, neuropathy- numbness and tinging in your hands and feet,  increase risk of heart attack and stroke, and amputation due to decrease wound healing and circulation. Decrease your risk by taking medication daily as prescribed, monitor carbohydrates- foods that are high in carbohydrates are the following rice, potatoes, breads, sugars, and pastas.  Reduction in the intake (eating) will assist  in lowering your blood sugars. Exercise daily at least 30 minutes daily.   Colon cancer screening Normal colon cancer screening.  CDC recommends colorectal screening from ages 8-75. Screening can begin at 20 or earlier in some cases. This screening is used for a disease when no symptoms are present . Diagnostic test is used for symptoms examples blood in stool, colorectal polyps or coloector cancer, family history or inflammatory bowel disease - chron's or ulcerative colitis .(USPSTF) -     Ambulatory referral to Gastroenterology  Need for vaccination against Streptococcus  pneumoniae using pneumococcal conjugate vaccine 13 CDC recommendations for pneumococcal vaccination to help prevent the spread of pneumococcal disease.    I am having Finch M. Eichler maintain his aspirin, Accu-Chek Guide Me, Accu-Chek Guide, Accu-Chek Softclix Lancets, metFORMIN, Victoza, TRUEplus Pen Needles, NovoLOG FlexPen, Lantus SoloStar, losartan-hydrochlorothiazide, rosuvastatin, and gabapentin.  No orders of the defined types were placed in this encounter.    Follow-up:   Return in about 2 months (around 05/05/2020) for DM.  The above assessment and management plan was discussed with the patient. The patient verbalized understanding of and has agreed to the management plan. Patient is aware to call the clinic if symptoms fail to improve or worsen. Patient is aware when to return to the clinic for a follow-up visit. Patient educated on when it is appropriate to go to the emergency department.   Juluis Mire, NP-C

## 2020-04-05 ENCOUNTER — Ambulatory Visit: Payer: Medicare HMO | Admitting: Endocrinology

## 2020-05-05 ENCOUNTER — Ambulatory Visit (INDEPENDENT_AMBULATORY_CARE_PROVIDER_SITE_OTHER): Payer: Medicare HMO | Admitting: Primary Care

## 2020-05-06 ENCOUNTER — Encounter (INDEPENDENT_AMBULATORY_CARE_PROVIDER_SITE_OTHER): Payer: Self-pay | Admitting: Primary Care

## 2020-05-06 ENCOUNTER — Emergency Department (HOSPITAL_COMMUNITY): Payer: Medicare HMO

## 2020-05-06 ENCOUNTER — Other Ambulatory Visit (INDEPENDENT_AMBULATORY_CARE_PROVIDER_SITE_OTHER): Payer: Self-pay | Admitting: Primary Care

## 2020-05-06 ENCOUNTER — Emergency Department (HOSPITAL_COMMUNITY)
Admission: EM | Admit: 2020-05-06 | Discharge: 2020-05-06 | Disposition: A | Discharge status: Home / Self Care | Payer: Medicare HMO | Attending: Emergency Medicine | Admitting: Emergency Medicine

## 2020-05-06 ENCOUNTER — Other Ambulatory Visit: Payer: Self-pay

## 2020-05-06 ENCOUNTER — Encounter (HOSPITAL_COMMUNITY): Payer: Self-pay

## 2020-05-06 ENCOUNTER — Other Ambulatory Visit: Payer: Self-pay | Admitting: Family Medicine

## 2020-05-06 ENCOUNTER — Ambulatory Visit (INDEPENDENT_AMBULATORY_CARE_PROVIDER_SITE_OTHER): Payer: Medicare HMO | Admitting: Primary Care

## 2020-05-06 VITALS — BP 111/73 | HR 72 | Temp 97.7°F | Ht 66.0 in | Wt 121.6 lb

## 2020-05-06 DIAGNOSIS — E1165 Type 2 diabetes mellitus with hyperglycemia: Secondary | ICD-10-CM | POA: Diagnosis not present

## 2020-05-06 DIAGNOSIS — Z794 Long term (current) use of insulin: Secondary | ICD-10-CM | POA: Diagnosis not present

## 2020-05-06 DIAGNOSIS — I10 Essential (primary) hypertension: Secondary | ICD-10-CM | POA: Diagnosis not present

## 2020-05-06 DIAGNOSIS — I447 Left bundle-branch block, unspecified: Secondary | ICD-10-CM | POA: Diagnosis not present

## 2020-05-06 DIAGNOSIS — Z7984 Long term (current) use of oral hypoglycemic drugs: Secondary | ICD-10-CM | POA: Insufficient documentation

## 2020-05-06 DIAGNOSIS — Z79899 Other long term (current) drug therapy: Secondary | ICD-10-CM | POA: Insufficient documentation

## 2020-05-06 DIAGNOSIS — E119 Type 2 diabetes mellitus without complications: Secondary | ICD-10-CM

## 2020-05-06 DIAGNOSIS — E1142 Type 2 diabetes mellitus with diabetic polyneuropathy: Secondary | ICD-10-CM

## 2020-05-06 DIAGNOSIS — F17210 Nicotine dependence, cigarettes, uncomplicated: Secondary | ICD-10-CM | POA: Diagnosis not present

## 2020-05-06 DIAGNOSIS — I2510 Atherosclerotic heart disease of native coronary artery without angina pectoris: Secondary | ICD-10-CM | POA: Insufficient documentation

## 2020-05-06 DIAGNOSIS — E0842 Diabetes mellitus due to underlying condition with diabetic polyneuropathy: Secondary | ICD-10-CM | POA: Diagnosis not present

## 2020-05-06 DIAGNOSIS — R739 Hyperglycemia, unspecified: Secondary | ICD-10-CM | POA: Diagnosis not present

## 2020-05-06 DIAGNOSIS — Z7982 Long term (current) use of aspirin: Secondary | ICD-10-CM | POA: Diagnosis not present

## 2020-05-06 DIAGNOSIS — R42 Dizziness and giddiness: Secondary | ICD-10-CM | POA: Diagnosis not present

## 2020-05-06 LAB — CBC WITH DIFFERENTIAL/PLATELET
Abs Immature Granulocytes: 0.02 10*3/uL (ref 0.00–0.07)
Basophils Absolute: 0.1 10*3/uL (ref 0.0–0.1)
Basophils Relative: 1 %
Eosinophils Absolute: 0.1 10*3/uL (ref 0.0–0.5)
Eosinophils Relative: 2 %
HCT: 35.9 % — ABNORMAL LOW (ref 39.0–52.0)
Hemoglobin: 12.1 g/dL — ABNORMAL LOW (ref 13.0–17.0)
Immature Granulocytes: 0 %
Lymphocytes Relative: 21 %
Lymphs Abs: 1.2 10*3/uL (ref 0.7–4.0)
MCH: 30.9 pg (ref 26.0–34.0)
MCHC: 33.7 g/dL (ref 30.0–36.0)
MCV: 91.6 fL (ref 80.0–100.0)
Monocytes Absolute: 0.4 10*3/uL (ref 0.1–1.0)
Monocytes Relative: 7 %
Neutro Abs: 4 10*3/uL (ref 1.7–7.7)
Neutrophils Relative %: 69 %
Platelets: 178 10*3/uL (ref 150–400)
RBC: 3.92 MIL/uL — ABNORMAL LOW (ref 4.22–5.81)
RDW: 13.2 % (ref 11.5–15.5)
WBC: 5.8 10*3/uL (ref 4.0–10.5)
nRBC: 0 % (ref 0.0–0.2)

## 2020-05-06 LAB — BASIC METABOLIC PANEL
Anion gap: 7 (ref 5–15)
BUN: 18 mg/dL (ref 8–23)
CO2: 24 mmol/L (ref 22–32)
Calcium: 8.9 mg/dL (ref 8.9–10.3)
Chloride: 104 mmol/L (ref 98–111)
Creatinine, Ser: 1.1 mg/dL (ref 0.61–1.24)
GFR, Estimated: >60 mL/min (ref >60)
Glucose, Bld: 379 mg/dL — ABNORMAL HIGH (ref 70–99)
Potassium: 4.5 mmol/L (ref 3.5–5.1)
Sodium: 135 mmol/L (ref 135–145)

## 2020-05-06 LAB — POCT GLYCOSYLATED HEMOGLOBIN (HGB A1C)

## 2020-05-06 LAB — POCT URINALYSIS DIP (CLINITEK)
Bilirubin, UA: NEGATIVE
Glucose, UA: 500 mg/dL — AB
Ketones, POC UA: NEGATIVE mg/dL
Leukocytes, UA: NEGATIVE
Nitrite, UA: NEGATIVE
POC PROTEIN,UA: 100 — AB
Spec Grav, UA: 1.01 (ref 1.010–1.025)
Urobilinogen, UA: 0.2 E.U./dL
pH, UA: 5.5 (ref 5.0–8.0)

## 2020-05-06 LAB — CBG MONITORING, ED: Glucose-Capillary: 427 mg/dL — ABNORMAL HIGH (ref 70–99)

## 2020-05-06 LAB — GLUCOSE, POCT (MANUAL RESULT ENTRY): POC Glucose: 488 mg/dl — AB (ref 70–99)

## 2020-05-06 MED ORDER — LANTUS SOLOSTAR 100 UNIT/ML ~~LOC~~ SOPN: 54.0000 [IU] | PEN_INJECTOR | Freq: Every day | SUBCUTANEOUS | 2 refills | Status: DC

## 2020-05-06 MED ORDER — INSULIN ASPART 100 UNIT/ML ~~LOC~~ SOLN
10.0000 [IU] | Freq: Once | SUBCUTANEOUS | Status: AC
  Administered 2020-05-06: 10 [IU] via SUBCUTANEOUS

## 2020-05-06 MED ORDER — LACTATED RINGERS IV BOLUS
1000.0000 mL | INTRAVENOUS | Status: AC
  Administered 2020-05-06: 1000 mL via INTRAVENOUS

## 2020-05-06 MED ORDER — METFORMIN HCL 1000 MG PO TABS: 1000.0000 mg | ORAL_TABLET | Freq: Two times a day (BID) | ORAL | 0 refills | Status: DC

## 2020-05-06 MED ORDER — NOVOLOG FLEXPEN 100 UNIT/ML ~~LOC~~ SOPN: 7.0000 [IU] | PEN_INJECTOR | Freq: Two times a day (BID) | SUBCUTANEOUS | 0 refills | Status: DC

## 2020-05-06 MED ORDER — LOSARTAN POTASSIUM-HCTZ 100-25 MG PO TABS: 1.0000 | ORAL_TABLET | Freq: Every day | ORAL | 0 refills | Status: DC

## 2020-05-06 MED ORDER — INSULIN GLARGINE 100 UNIT/ML SOLOSTAR PEN: 54.0000 [IU] | PEN_INJECTOR | Freq: Every day | SUBCUTANEOUS | 0 refills | Status: DC

## 2020-05-06 MED ORDER — VICTOZA 18 MG/3ML ~~LOC~~ SOPN: 1.8000 mg | PEN_INJECTOR | Freq: Every day | SUBCUTANEOUS | 2 refills | Status: DC

## 2020-05-06 MED ORDER — NOVOLOG FLEXPEN 100 UNIT/ML ~~LOC~~ SOPN: 7.0000 [IU] | PEN_INJECTOR | Freq: Two times a day (BID) | SUBCUTANEOUS | 3 refills | Status: DC

## 2020-05-06 MED ORDER — TRUEPLUS PEN NEEDLES 32G X 4 MM MISC: 0 refills | Status: DC

## 2020-05-06 MED ORDER — METFORMIN HCL 1000 MG PO TABS: 1000.0000 mg | ORAL_TABLET | Freq: Two times a day (BID) | ORAL | 2 refills | Status: DC

## 2020-05-06 MED ORDER — GABAPENTIN 100 MG PO CAPS: 100.0000 mg | ORAL_CAPSULE | Freq: Three times a day (TID) | ORAL | 1 refills | Status: DC

## 2020-05-06 MED ORDER — LANTUS SOLOSTAR 100 UNIT/ML ~~LOC~~ SOPN: 54.0000 [IU] | PEN_INJECTOR | Freq: Every day | SUBCUTANEOUS | 0 refills | Status: DC

## 2020-05-06 MED ORDER — INSULIN ASPART 100 UNIT/ML FLEXPEN
7.0000 [IU] | PEN_INJECTOR | Freq: Two times a day (BID) | SUBCUTANEOUS | 0 refills | Status: DC
Start: 2020-05-06 — End: 2020-05-06

## 2020-05-06 MED ORDER — LOSARTAN POTASSIUM-HCTZ 100-25 MG PO TABS: 1.0000 | ORAL_TABLET | Freq: Every day | ORAL | 3 refills | Status: DC

## 2020-05-06 MED FILL — PENTIPS 32G X 4 MM MISC: 32G X 4 MM | 25 days supply | Qty: 100 | Fill #0

## 2020-05-06 MED FILL — LOSARTAN-HCTZ 100-25 MG TAB: 100-25 | 90 days supply | Qty: 90 | Fill #0

## 2020-05-06 MED FILL — LANTUS SOLOSTAR 100 UNITS/M: 100 | 27 days supply | Qty: 15 | Fill #0

## 2020-05-06 MED FILL — NOVOLOG FLEXPEN SYRINGE: 100 | 30 days supply | Qty: 6 | Fill #0

## 2020-05-06 MED FILL — METFORMIN HCL 1000 MG TABS: 1000 | 30 days supply | Qty: 60 | Fill #0

## 2020-05-06 NOTE — Discharge Instructions (Addendum)
Take your insulin as you were previously prescribed.  Stay well hydrated.  Come back if your symptoms worsen.

## 2020-05-06 NOTE — Progress Notes (Signed)
Established Patient Office Visit  Subjective:  Patient ID: Colin [name], male    DOB: October 14, 1950  Age: 70 y.o. MRN: [id number]  CC:  Chief Complaint  Patient presents with  . Diabetes    HPI Colin [name] a 70 years old [place] presents for management of diabetes interpretor only available by phone explain to [place] reschedule appointment in person is better for understanding. Out of medication for 2 months . The last 2 visit PCP sent to ED waited > 10 hrs was never seen and Seen Dr. Lurena Joiner was told the same thing wait > 12 hours and left.   Past Medical History:  Diagnosis Date  . Arrhythmia   . Carotid artery occlusion   . Diabetes mellitus     Past Surgical History:  Procedure Laterality Date  . CARDIAC CATHETERIZATION     01/2010 ; 04/2010    No family history on file.  Social History   Socioeconomic History  . Marital status: Married    Spouse name: Not on file  . Number of children: Not on file  . Years of education: Not on file  . Highest education level: Not on file  Occupational History  . Not on file  Tobacco Use  . Smoking status: Current Every Day Smoker    Packs/day: 0.25    Types: Cigarettes  . Smokeless tobacco: Never Used  Vaping Use  . Vaping Use: Never used  Substance and Sexual Activity  . Alcohol use: No  . Drug use: No  . Sexual activity: Not on file  Other Topics Concern  . Not on file  Social History Narrative  . Not on file   Social Determinants of Health   Financial Resource Strain: Not on file  Food Insecurity: Not on file  Transportation Needs: Not on file  Physical Activity: Not on file  Stress: Not on file  Social Connections: Not on file  Intimate Partner Violence: Not on file    Outpatient Medications Prior to Visit  Medication Sig Dispense Refill  . Accu-Chek Softclix Lancets lancets Use to check blood sugar three times daily. 100 each 6  . aspirin 81 MG tablet Take 81 mg by mouth daily.    . Blood Glucose  Monitoring Suppl (ACCU-CHEK GUIDE ME) w/Device KIT Use to check blood sugar three times daily. 1 kit 0  . glucose blood (ACCU-CHEK GUIDE) test strip Use to check blood sugar three times daily. 100 each 6  . gabapentin (NEURONTIN) 100 MG capsule Take 1 capsule (100 mg total) by mouth 3 (three) times daily. 270 capsule 1  . insulin aspart (NOVOLOG FLEXPEN) 100 UNIT/ML FlexPen Inject 7 Units into the skin 2 (two) times daily with a meal. 15 mL 3  . insulin glargine (LANTUS SOLOSTAR) 100 UNIT/ML Solostar Pen Inject 54 Units into the skin daily. 15 mL 2  . Insulin Pen Needle (TRUEPLUS PEN NEEDLES) 32G X 4 MM MISC Use as instructed to inject Lantus and Victoza once daily. 100 each 2  . liraglutide (VICTOZA) 18 MG/3ML SOPN Inject 1.8 mg into the skin daily. 9 mL 2  . losartan-hydrochlorothiazide (HYZAAR) 100-25 MG tablet Take 1 tablet by mouth daily. 90 tablet 3  . metFORMIN (GLUCOPHAGE) 1000 MG tablet Take 1 tablet (1,000 mg total) by mouth 2 (two) times daily with a meal. 60 tablet 2  . rosuvastatin (CRESTOR) 20 MG tablet Take 1 tablet (20 mg total) by mouth daily. 90 tablet 1   No facility-administered medications prior  to visit.    No Known Allergies  ROS Review of Systems  Endocrine: Positive for polydipsia, polyphagia and polyuria.  Genitourinary: Positive for urgency.  All other systems reviewed and are negative.     Objective:    Physical Exam Constitutional:      Appearance: Normal appearance.  HENT:     Head: Normocephalic.     Right Ear: External ear normal.     Left Ear: External ear normal.     Nose: Nose normal.  Eyes:     Extraocular Movements: Extraocular movements intact.  Cardiovascular:     Rate and Rhythm: Normal rate and regular rhythm.  Pulmonary:     Effort: Pulmonary effort is normal.     Breath sounds: Normal breath sounds.  Abdominal:     General: Bowel sounds are normal.     Palpations: Abdomen is soft.  Musculoskeletal:        General: Normal range of  motion.     Cervical back: Normal range of motion.  Skin:    General: Skin is warm and dry.  Neurological:     Mental Status: He is alert and oriented to person, place, and time.  Psychiatric:        Mood and Affect: Mood normal.        Behavior: Behavior normal.        Thought Content: Thought content normal.        Judgment: Judgment normal.     BP 111/73 (BP Location: Right Arm, Patient Position: Sitting, Cuff Size: Normal)   Pulse 72   Temp 97.7 F (36.5 C) (Temporal)   Ht _0  (1.676 m)   Wt 121 lb 9.6 oz (55.2 kg)   SpO2 96%   BMI 19.63 kg/m  Wt Readings from Last 3 Encounters:  05/06/20 121 lb 11.1 oz (55.2 kg)  05/06/20 121 lb 9.6 oz (55.2 kg)  03/07/20 132 lb 3.2 oz (60 kg)     Health Maintenance Due  Topic Date Due  . Hepatitis C Screening  Never done  . COVID-19 Vaccine (1) Never done  . OPHTHALMOLOGY EXAM  Never done  . COLONOSCOPY (Pts 45-81yr Insurance coverage will need to be confirmed)  Never done    There are no preventive care reminders to display for this patient.  Lab Results  Component Value Date   TSH 3.058 01/22/2010   Lab Results  Component Value Date   WBC 5.8 05/06/2020   HGB 12.1 (L) 05/06/2020   HCT 35.9 (L) 05/06/2020   MCV 91.6 05/06/2020   PLT 178 05/06/2020   Lab Results  Component Value Date   NA 135 05/06/2020   K 4.5 05/06/2020   CO2 24 05/06/2020   GLUCOSE 379 (H) 05/06/2020   BUN 18 05/06/2020   CREATININE 1.10 05/06/2020   BILITOT <0.2 12/17/2019   ALKPHOS 82 12/17/2019   AST 12 12/17/2019   ALT 23 12/17/2019   PROT 6.8 12/17/2019   ALBUMIN 4.0 12/17/2019   CALCIUM 8.9 05/06/2020   ANIONGAP 7 05/06/2020   Lab Results  Component Value Date   CHOL 171 12/17/2019   Lab Results  Component Value Date   HDL 41 12/17/2019   Lab Results  Component Value Date   LDLCALC 114 (H) 12/17/2019   Lab Results  Component Value Date   TRIG 88 12/17/2019   Lab Results  Component Value Date   CHOLHDL 4.2  12/17/2019   Lab Results  Component Value Date   HGBA1C  05/06/2020     Comment:     too high to read      Assessment & Plan:   Colin [name] was seen today for diabetes.  Diagnoses and all orders for this visit:  Hyperglycemia -     POCT URINALYSIS DIP (CLINITEK)  Type 2 diabetes mellitus with complication, with long-term current use of insulin ([place]) Uncontrolled unable to obtain A1C in office indicating >15 Called EMS for transportation to ED for tx after waiting > 12mns . Signed AMA and left  -     insulin aspart (NOVOLOG FLEXPEN) 100 UNIT/ML FlexPen; Inject 7 Units into the skin 2 (two) times daily with a meal. -     insulin glargine (LANTUS SOLOSTAR) 100 UNIT/ML Solostar Pen; Inject 54 Units into the skin daily. -     liraglutide (VICTOZA) 18 MG/3ML SOPN; Inject 1.8 mg into the skin daily. -     metFORMIN (GLUCOPHAGE) 1000 MG tablet; Take 1 tablet (1,000 mg total) by mouth 2 (two) times daily with a meal. Refer to endocrinology appt scheduled.   Essential hypertension Blood pressure is well controlled on dual therapy. Counseled on diet , low-sodium, DASH diet, medication compliance, 150 minutes of moderate intensity exercise per week. Discussed medication compliance, adverse effects. -     losartan-hydrochlorothiazide (HYZAAR) 100-25 MG tablet; Take 1 tablet by mouth daily.  Diabetic polyneuropathy associated with diabetes mellitus due to underlying condition (HCC) -     gabapentin (NEURONTIN) 100 MG capsule; Take 1 capsule (100 mg total) by mouth 3 (three) times daily.    Meds ordered this encounter  Medications  . DISCONTD: insulin aspart (NOVOLOG FLEXPEN) 100 UNIT/ML FlexPen    Sig: Inject 7 Units into the skin 2 (two) times daily with a meal.    Dispense:  15 mL    Refill:  3  . DISCONTD: insulin glargine (LANTUS SOLOSTAR) 100 UNIT/ML Solostar Pen    Sig: Inject 54 Units into the skin daily.    Dispense:  15 mL    Refill:  2  . liraglutide (VICTOZA) 18 MG/3ML SOPN     Sig: Inject 1.8 mg into the skin daily.    Dispense:  18 each    Refill:  2  . DISCONTD: metFORMIN (GLUCOPHAGE) 1000 MG tablet    Sig: Take 1 tablet (1,000 mg total) by mouth 2 (two) times daily with a meal.    Dispense:  60 tablet    Refill:  2  . DISCONTD: losartan-hydrochlorothiazide (HYZAAR) 100-25 MG tablet    Sig: Take 1 tablet by mouth daily.    Dispense:  90 tablet    Refill:  3  . DISCONTD: gabapentin (NEURONTIN) 100 MG capsule    Sig: Take 1 capsule (100 mg total) by mouth 3 (three) times daily.    Dispense:  270 capsule    Refill:  1    Follow-up: Return in about 3 months (around 08/05/2020) for monitor Bp/DM .    MKerin Perna NP

## 2020-05-06 NOTE — ED Notes (Signed)
Pharmacist at bedside with pt meds.

## 2020-05-06 NOTE — ED Provider Notes (Addendum)
[facility]PART[facility] Provider Note   CSN: [id number] Arrival date & time: 05/06/20  1322     History Chief Complaint  Patient presents with  . Hyperglycemia    Colin [name] is a 70 y.o. [name].  HPI  Patient presents today via EMS for hyperglycemia noted at PCP visit CBG of 450 Reported symptoms of dizziness to EMS Received 500 cc NS bolus en route Ran out of insulin and other medications 3 weeks ago Has been feeling intermittently dizzy, but states that this has been going on for quite some time Denies any dizziness at present Endorses polyuria and polydipsia Hasn't been able to eat much Denies N/V Endorses constipation, denies abd pain Denies headache, changes in vision Prior to running out of his medications, he was in his usual state of health and without complaint    Past Medical History:  Diagnosis Date  . Arrhythmia   . Carotid artery occlusion   . Diabetes mellitus     Patient Active Problem List   Diagnosis Date Noted  . Essential hypertension 10/07/2019  . Coronary artery disease 04/26/2010  . Diabetes mellitus ([place]) 04/26/2010  . Hyperlipidemia 04/26/2010    Past Surgical History:  Procedure Laterality Date  . CARDIAC CATHETERIZATION     01/2010 ; 04/2010       History reviewed. No pertinent family history.  Social History   Tobacco Use  . Smoking status: Current Every Day Smoker    Packs/day: 0.25    Types: Cigarettes  . Smokeless tobacco: Never Used  Vaping Use  . Vaping Use: Never used  Substance Use Topics  . Alcohol use: No  . Drug use: No    Home Medications Prior to Admission medications   Medication Sig Start Date End Date Taking? Authorizing Provider  Accu-Chek Softclix Lancets lancets Use to check blood sugar three times daily. 09/23/19   Charlott Rakes, MD  aspirin 81 MG tablet Take 81 mg by mouth daily.    [provider]  Blood Glucose Monitoring Suppl (ACCU-CHEK GUIDE ME) w/Device  KIT Use to check blood sugar three times daily. 09/23/19   Charlott Rakes, MD  gabapentin (NEURONTIN) 100 MG capsule Take 1 capsule (100 mg total) by mouth 3 (three) times daily. 05/06/20   Kerin Perna, NP  glucose blood (ACCU-CHEK GUIDE) test strip Use to check blood sugar three times daily. 09/23/19   Charlott Rakes, MD  insulin aspart (NOVOLOG FLEXPEN) 100 UNIT/ML FlexPen Inject 7 Units into the skin 2 (two) times daily with a meal. 05/06/20   Kerin Perna, NP  insulin glargine (LANTUS SOLOSTAR) 100 UNIT/ML Solostar Pen Inject 54 Units into the skin daily. 05/06/20   Kerin Perna, NP  Insulin Pen Needle (TRUEPLUS PEN NEEDLES) 32G X 4 MM MISC Use as instructed to inject Lantus and Victoza once daily. 12/02/19   Charlott Rakes, MD  liraglutide (VICTOZA) 18 MG/3ML SOPN Inject 1.8 mg into the skin daily. 05/06/20   Kerin Perna, NP  losartan-hydrochlorothiazide (HYZAAR) 100-25 MG tablet Take 1 tablet by mouth daily. 05/06/20   Kerin Perna, NP  metFORMIN (GLUCOPHAGE) 1000 MG tablet Take 1 tablet (1,000 mg total) by mouth 2 (two) times daily with a meal. 05/06/20   Kerin Perna, NP  rosuvastatin (CRESTOR) 20 MG tablet Take 1 tablet (20 mg total) by mouth daily. 12/25/19   Kerin Perna, NP    Allergies    Patient has no known allergies.  Review of  Systems   Review of Systems  Constitutional: Positive for appetite change and fatigue. Negative for fever.  HENT: Negative for congestion.   Eyes: Negative for visual disturbance.  Respiratory: Negative for cough and shortness of breath.   Cardiovascular: Negative for chest pain.  Gastrointestinal: Positive for constipation. Negative for abdominal pain, diarrhea, nausea and vomiting.  Endocrine: Positive for polydipsia and polyuria.  Genitourinary: Negative for difficulty urinating, dysuria and hematuria.  Neurological: Positive for dizziness (intermittent). Negative for syncope and headaches.       Tingling  chronically in BLE  Psychiatric/Behavioral: Negative for confusion.    Physical Exam Updated Vital Signs BP (!) 154/66 (BP Location: Right Arm)   Pulse 66   Temp 98.5 F (36.9 C) (Oral)   Resp 15   Ht _0  (1.676 m)   Wt 55.2 kg   SpO2 98%   BMI 19.64 kg/m   Physical Exam Constitutional:      General: He is not in acute distress.    Appearance: Normal appearance.  HENT:     Head: Normocephalic and atraumatic.     Mouth/Throat:     Mouth: Mucous membranes are moist.  Eyes:     Extraocular Movements: Extraocular movements intact.     Pupils: Pupils are equal, round, and reactive to light.  Cardiovascular:     Rate and Rhythm: Normal rate and regular rhythm.     Heart sounds: No murmur heard. No friction rub. No gallop.   Pulmonary:     Effort: Pulmonary effort is normal. No respiratory distress.     Breath sounds: Normal breath sounds. No wheezing, rhonchi or rales.  Abdominal:     General: Abdomen is flat. There is no distension.     Palpations: Abdomen is soft.     Tenderness: There is no abdominal tenderness.  Musculoskeletal:        General: No tenderness.     Cervical back: Normal range of motion.     Right lower leg: No edema.     Left lower leg: No edema.  Skin:    General: Skin is warm and dry.  Neurological:     General: No focal deficit present.     Mental Status: He is alert and oriented to person, place, and time. Mental status is at baseline.  Psychiatric:        Mood and Affect: Mood normal.        Behavior: Behavior normal.        Thought Content: Thought content normal.        Judgment: Judgment normal.     ED Results / Procedures / Treatments   Labs (all labs ordered are listed, but only abnormal results are displayed) Labs Reviewed  CBG MONITORING, ED - Abnormal; Notable for the following components:      Result Value   Glucose-Capillary 427 (*)    All other components within normal limits  BASIC METABOLIC PANEL  CBC WITH  DIFFERENTIAL/PLATELET  CBG MONITORING, ED    EKG None  Radiology No results found.  Procedures Procedures   Medications Ordered in ED Medications  lactated ringers bolus 1,000 mL (has no administration in time range)  insulin aspart (novoLOG) injection 10 Units (has no administration in time range)    ED Course  I have reviewed the triage vital signs and the nursing notes.  Pertinent labs & imaging results that were available during my care of the patient were reviewed by me and considered in my medical decision  making (see chart for details).    MDM Rules/Calculators/A&P                          Patient is a 70 y.o. [name] who presents with hyperglycemia in the setting of running out of medications including insulin 3 weeks ago.  Endorses polyuria and polydipsia.  Has intermittent dizziness, but seems chronic on report and states it happens "when he is hot," denies any currently.  Has had decreased food intake.  Prior to running out of insulin was in usual state of health.  Does not appear severely dehydrated on examination.  Received 500 cc normal saline bolus in route with EMS.  Point-of-care CBG 427 in ED.  He takes 54 units of Lantus daily and 7 units of NovoLog twice daily.  We will go ahead and give him 10 units of NovoLog here.  We will also order 1 L LR bolus.  Follow-up BMP and CBC.  BMP and CBC WNL aside from slight anemia and hyperglycemia.  CBG improving with insulin administration.  Will send insulin, metformin, and hyzaar to [facility] and give to patient prior to discharge.  Vitals remain stable and he is without complaint.  Will discharge home.   Final Clinical Impression(s) / ED Diagnoses Final diagnoses:  None    Rx / DC Orders ED Discharge Orders    None       Cleophas Dunker, DO 05/06/20 Hand, DO 05/06/20 1613    Elnora Morrison, MD 05/07/20 1022

## 2020-05-06 NOTE — Progress Notes (Signed)
Inpatient Diabetes Program Recommendations  AACE/ADA: New Consensus Statement on Inpatient Glycemic Control (2015)  Target Ranges:  Prepandial:   less than 140 mg/dL      Peak postprandial:   less than 180 mg/dL (1-2 hours)      Critically ill patients:  140 - 180 mg/dL   Lab Results  Component Value Date   GLUCAP 427 (H) 05/06/2020   HGBA1C  05/06/2020     Comment:     too high to read    Review of Glycemic Control  Diabetes history: type 2 Outpatient Diabetes medications: Victoza 1.8 mg daily, Lantus 54 units daily, Novolog 7 units BID, Metformin 1000 mg BID Current orders for Inpatient glycemic control: none  Inpatient Diabetes Program Recommendations:   Received diabetes coordinator consult. [facility] and [facility]ist at [facility] and [facility]. Noted that HgbA1C was down from 12.7% to 11.0% on 02/12/20. This patient needs prescriptions for diabetes medications. Will consult [facility] for assistance with getting medications.  Smith Mince RN BSN CDE Diabetes Coordinator Pager: [phone]  8am-5pm

## 2020-05-06 NOTE — ED Triage Notes (Signed)
Seen at doctor's office today for hyperglycemia with blood sugar of 450. Symptoms of dizziness per EMS. Alert and oriented x 4. Pt has not taken insulin in 2 weeks. EMS gave 500cc NS bolus.

## 2020-05-07 ENCOUNTER — Other Ambulatory Visit: Payer: Self-pay

## 2020-05-08 ENCOUNTER — Other Ambulatory Visit: Payer: Self-pay

## 2020-05-08 MED FILL — Metformin HCl Tab 1000 MG: ORAL | 90 days supply | Qty: 180 | Fill #0 | Status: CN

## 2020-05-08 MED FILL — Gabapentin Cap 100 MG: ORAL | 90 days supply | Qty: 270 | Fill #0 | Status: CN

## 2020-05-16 ENCOUNTER — Other Ambulatory Visit: Payer: Self-pay

## 2020-05-30 ENCOUNTER — Other Ambulatory Visit: Payer: Self-pay

## 2020-05-30 ENCOUNTER — Ambulatory Visit (INDEPENDENT_AMBULATORY_CARE_PROVIDER_SITE_OTHER): Payer: Medicare HMO | Admitting: Endocrinology

## 2020-05-30 ENCOUNTER — Other Ambulatory Visit (INDEPENDENT_AMBULATORY_CARE_PROVIDER_SITE_OTHER): Payer: Self-pay | Admitting: Primary Care

## 2020-05-30 ENCOUNTER — Encounter: Payer: Self-pay | Admitting: Endocrinology

## 2020-05-30 ENCOUNTER — Ambulatory Visit: Payer: Medicare HMO | Admitting: Endocrinology

## 2020-05-30 VITALS — BP 130/60 | HR 87 | Ht 66.0 in | Wt 126.0 lb

## 2020-05-30 DIAGNOSIS — Z794 Long term (current) use of insulin: Secondary | ICD-10-CM

## 2020-05-30 DIAGNOSIS — E782 Mixed hyperlipidemia: Secondary | ICD-10-CM

## 2020-05-30 DIAGNOSIS — E119 Type 2 diabetes mellitus without complications: Secondary | ICD-10-CM

## 2020-05-30 LAB — POCT GLYCOSYLATED HEMOGLOBIN (HGB A1C): Hemoglobin A1C: 13 % — AB (ref 4.0–5.6)

## 2020-05-30 MED ORDER — ROSUVASTATIN CALCIUM 20 MG PO TABS
ORAL_TABLET | Freq: Every day | ORAL | 1 refills | Status: DC
  Filled 2020-05-30: qty 90, fill #0

## 2020-05-30 MED ORDER — ROSUVASTATIN CALCIUM 20 MG PO TABS: ORAL_TABLET | Freq: Every day | ORAL | 1 refills | Status: DC

## 2020-05-30 MED ORDER — INSULIN GLARGINE 100 UNIT/ML SOLOSTAR PEN
60.0000 [IU] | PEN_INJECTOR | SUBCUTANEOUS | 3 refills | Status: DC
  Filled 2020-05-30: qty 15, 25d supply, fill #0
  Filled 2020-08-04: qty 54, 90d supply, fill #0

## 2020-05-30 NOTE — Patient Instructions (Addendum)
good diet and exercise significantly improve the control of your diabetes.  please let me know if you wish to be referred to a dietician.  high blood sugar is very risky to your health.  you should see an eye doctor and dentist every year.  It is very important to get all recommended vaccinations.  Controlling your blood pressure and cholesterol drastically reduces the damage diabetes does to your body.  Those who smoke should quit.  Please discuss these with your doctor.  check your blood sugar twice a day.  vary the time of day when you check, between before the 3 meals, and at bedtime.  also check if you have symptoms of your blood sugar being too high or too low.  please keep a record of the readings and bring it to your next appointment here (or you can bring the meter itself).  You can write it on any piece of paper.  please call us sooner if your blood sugar goes below 70, or if most of your readings are over 200. We will need to take this complex situation in stages.   For now, please: Increase the lantus to 60 units daily, and:  Stop taking the Novolog, Victoza, and metformin.  Please come back for a follow-up appointment in 1 month.      ch? ?? ?n u?ng t?t v t?p th? d?c c?i thi?n ?ng k? vi?c ki?m sot b?nh ti?u ???ng c?a b?n. xin vui lng cho ti bi?t n?u b?n mu?n ???c gi?i thi?u ??n m?t chuyn gia dinh d??ng. l??ng ???ng trong mu cao c r?t nhi?u nguy c? ??i v?i s?c kh?e c?a b?n. b?n nn ?i khm bc s? nhn khoa v nha s? hng n?m. ?i?u r?t quan tr?ng l ph?i tim phng t?t c? cc lo?i v?c xin ???c khuy?n ngh?. Ki?m sot huy?t p v cholesterol lm gi?m ?ng k? tc h?i c?a b?nh ti?u ???ng ??i v?i c? th? c?a b?n. Nh?ng ng??i ht thu?c nn b? thu?c l. Vui lng th?o lu?n v? nh?ng ?i?u ny v?i bc s? c?a b?n. ki?m tra l??ng ???ng trong mu c?a b?n hai l?n m?t ngy. thay ??i th?i gian trong ngy khi b?n ki?m tra, gi?a tr??c 3 b?a ?n v tr??c khi ?i ng?. c?ng ki?m tra xem b?n c cc tri?u ch?ng  v? l??ng ???ng trong mu c?a b?n qu cao ho?c qu th?p. vui lng ghi l?i cc k?t qu? ?o v mang n ??n cu?c h?n ti?p theo c?a b?n t?i ?y (ho?c b?n c th? t? mang theo my ?o). B?n c th? vi?t n trn b?t k? m?nh gi?y no. Vui lng g?i cho chng ti s?m h?n n?u l??ng ???ng trong mu c?a b?n xu?ng d??i 70, ho?c n?u h?u h?t cc k?t qu? ?o c?a b?n trn 200. Chng ta s? c?n ph?i x? l tnh hu?ng ph?c t?p ny theo t?ng giai ?o?n. Cn by gi?, vui lng: T?ng ?n l?ng ln 60 ??n v? hng ngy v: Ng?ng dng Novolog, Victoza v metformin. Vui lng ??n h?n ti khm sau 1 thng.

## 2020-05-30 NOTE — Progress Notes (Signed)
Subjective:    Patient ID: Colin [name], male    DOB: [birth year], 70 y.o.   MRN: [id number]  HPI Video interpreter.  pt is referred by Juluis Mire, NP, for diabetes.  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  He says cbg varies from 76-300.  There is no trend throughout the day.  Pt says he never misses meds.  Pt says he does not know who canceled his appt for today.   Past Medical History:  Diagnosis Date  . Arrhythmia   . Carotid artery occlusion   . Diabetes mellitus     Past Surgical History:  Procedure Laterality Date  . CARDIAC CATHETERIZATION     01/2010 ; 04/2010    Social History   Socioeconomic History  . Marital status: Married    Spouse name: Not on file  . Number of children: Not on file  . Years of education: Not on file  . Highest education level: Not on file  Occupational History  . Not on file  Tobacco Use  . Smoking status: Current Every Day Smoker    Packs/day: 0.25    Types: Cigarettes  . Smokeless tobacco: Never Used  Vaping Use  . Vaping Use: Never used  Substance and Sexual Activity  . Alcohol use: No  . Drug use: No  . Sexual activity: Not on file  Other Topics Concern  . Not on file  Social History Narrative  . Not on file   Social Determinants of Health   Financial Resource Strain: Not on file  Food Insecurity: Not on file  Transportation Needs: Not on file  Physical Activity: Not on file  Stress: Not on file  Social Connections: Not on file  Intimate Partner Violence: Not on file    Current Outpatient Medications on File Prior to Visit  Medication Sig Dispense Refill  . Accu-Chek Softclix Lancets lancets Use to check blood sugar three times daily. 100 each 6  . aspirin 81 MG tablet Take 81 mg by mouth daily.    . Blood Glucose Monitoring  Suppl (ACCU-CHEK GUIDE ME) w/Device KIT Use to check blood sugar three times daily. 1 kit 0  . gabapentin (NEURONTIN) 100 MG capsule TAKE 1 CAPSULE (100 MG TOTAL) BY MOUTH 3 (THREE) TIMES DAILY. 270 capsule 1  . glucose blood (ACCU-CHEK GUIDE) test strip Use to check blood sugar three times daily. 100 each 6  . Insulin Pen Needle (TRUEPLUS PEN NEEDLES) 32G X 4 MM MISC Use as instructed to inject Lantus and Victoza once daily. 100 each 0  . losartan-hydrochlorothiazide (HYZAAR) 100-25 MG tablet TAKE 1 TABLET BY MOUTH DAILY. 90 tablet 0  . rosuvastatin (CRESTOR) 20 MG tablet TAKE 1 TABLET (20 MG TOTAL) BY MOUTH DAILY. 90 tablet 1   No current facility-administered medications on file prior to visit.    No Known Allergies  Family History  Problem Relation Age of Onset  . Diabetes Neg Hx     BP 130/60 (BP Location: Right Arm, Patient Position: Sitting, Cuff Size: Normal)   Pulse 87   Ht 5' 6" (1.676 m)   Wt 126 lb (57.2 kg)   SpO2 96%   BMI 20.34 kg/m     Review of Systems denies sob, n/v, hypglycemia, memory loss.  He  has lost 21 lbs x a few months.      Objective:   Physical Exam VITAL SIGNS:  See vs page GENERAL: no distress Pulses: dorsalis pedis intact bilat.   MSK: no deformity of the feet CV: no leg edema.   Skin:  no ulcer on the feet.  normal color and temp on the feet.   Neuro: sensation is intact to touch on the feet, but decreased from normal.     Lab Results  Component Value Date   HGBA1C 13.0 (A) 05/30/2020   I have reviewed outside records, and summarized: Pt was noted to have elevated A1c, and referred here.  He was seen in ER for severe hyperglycemia.  He had run out of his meds, including insulin.     Assessment & Plan:  Insulin-requiring type 2 DM, new to me.  Severely poor glycemic control.    Patient Instructions  good diet and exercise significantly improve the control of your diabetes.  please let me know if you wish to be referred to a  dietician.  high blood sugar is very risky to your health.  you should see an eye doctor and dentist every year.  It is very important to get all recommended vaccinations.  Controlling your blood pressure and cholesterol drastically reduces the damage diabetes does to your body.  Those who smoke should quit.  Please discuss these with your doctor.  check your blood sugar twice a day.  vary the time of day when you check, between before the 3 meals, and at bedtime.  also check if you have symptoms of your blood sugar being too high or too low.  please keep a record of the readings and bring it to your next appointment here (or you can bring the meter itself).  You can write it on any piece of paper.  please call us sooner if your blood sugar goes below 70, or if most of your readings are over 200. We will need to take this complex situation in stages.   For now, please: Increase the lantus to 60 units daily, and:  Stop taking the Novolog, Victoza, and metformin.  Please come back for a follow-up appointment in 1 month.      ch? ?? ?n u?ng t?t v t?p th? d?c c?i thi?n ?ng k? vi?c ki?m sot b?nh ti?u ???ng c?a b?n. xin vui lng cho ti bi?t n?u b?n mu?n ???c gi?i thi?u ??n m?t chuyn gia dinh d??ng. l??ng ???ng trong mu cao c r?t nhi?u nguy c? ??i v?i s?c kh?e c?a b?n. b?n nn ?i khm bc s? nhn khoa v nha s? hng n?m. ?i?u r?t quan tr?ng l ph?i tim phng t?t c? cc lo?i v?c xin ???c khuy?n ngh?. Ki?m sot huy?t p v cholesterol lm gi?m ?ng k? tc h?i c?a b?nh ti?u ???ng ??i v?i c? th? c?a b?n. Nh?ng ng??i ht thu?c nn b? thu?c l. Vui lng th?o lu?n v? nh?ng ?i?u ny v?i bc s? c?a b?n. ki?m tra l??ng ???ng trong mu c?a b?n hai l?n m?t ngy. thay ??i th?i gian trong ngy khi b?n ki?m tra, gi?a tr??c 3 b?a ?n v tr??c khi ?i ng?. c?ng ki?m tra xem b?n c cc tri?u ch?ng v? l??ng ???ng trong mu c?a b?n qu cao ho?c qu th?p. vui lng ghi l?i cc k?t qu? ?o v mang n ??n cu?c h?n ti?p theo c?a  b?n t?i ?y (ho?c b?n c th? t? mang theo my ?o). B?n c th? vi?t  n trn b?t k? m?nh gi?y no. Vui lng g?i cho chng ti s?m h?n n?u l??ng ???ng trong mu c?a b?n xu?ng d??i 70, ho?c n?u h?u h?t cc k?t qu? ?o c?a b?n trn 200. Chng ta s? c?n ph?i x? l tnh hu?ng ph?c t?p ny theo t?ng giai ?o?n. Cn by gi?, vui lng: T?ng ?n l?ng ln 60 ??n v? hng ngy v: Ng?ng dng Novolog, Victoza v metformin. Vui lng ??n h?n ti khm sau 1 thng.

## 2020-06-01 ENCOUNTER — Other Ambulatory Visit: Payer: Self-pay

## 2020-07-25 ENCOUNTER — Ambulatory Visit: Payer: Medicare HMO | Admitting: Endocrinology

## 2020-08-04 ENCOUNTER — Other Ambulatory Visit: Payer: Self-pay

## 2020-08-04 ENCOUNTER — Encounter: Payer: Self-pay | Admitting: Endocrinology

## 2020-08-04 ENCOUNTER — Ambulatory Visit (INDEPENDENT_AMBULATORY_CARE_PROVIDER_SITE_OTHER): Payer: Medicare HMO | Admitting: Endocrinology

## 2020-08-04 VITALS — BP 130/60 | HR 70 | Ht 66.0 in | Wt 133.4 lb

## 2020-08-04 DIAGNOSIS — E119 Type 2 diabetes mellitus without complications: Secondary | ICD-10-CM

## 2020-08-04 DIAGNOSIS — Z794 Long term (current) use of insulin: Secondary | ICD-10-CM | POA: Diagnosis not present

## 2020-08-04 LAB — POCT GLYCOSYLATED HEMOGLOBIN (HGB A1C): Hemoglobin A1C: 10.7 % — AB (ref 4.0–5.6)

## 2020-08-04 MED ORDER — ACCU-CHEK GUIDE W/DEVICE KIT
PACK | 0 refills | Status: DC
  Filled 2020-08-04: qty 1, 30d supply, fill #0

## 2020-08-04 MED ORDER — ACCU-CHEK GUIDE VI STRP
1.0000 | ORAL_STRIP | Freq: Two times a day (BID) | 6 refills | Status: DC
  Filled 2020-08-04: qty 100, 50d supply, fill #0

## 2020-08-04 NOTE — Progress Notes (Signed)
Subjective:    Patient ID: Colin [name], male    DOB: 06/09/1950, 70 y.o.   MRN: [id number]  HPI Pt returns for f/u of diabetes mellitus: DM type: Insulin-requiring type 2 DM: Dx'ed: 2006 Complications: PN, PAD, and CAD Therapy: insulin since DKA: never Severe hypoglycemia: never Pancreatitis: never Pancreatic imaging: normal on 2012 CT SDOH: he gets meds from [facility] Other: due to noncompliance, he is not a candidate for multiple daily injections Interval history: Pt says [facility] pharmacy would not give him his insulin.  He has not recently checked cbg.   Past Medical History:  Diagnosis Date   Arrhythmia    Carotid artery occlusion    Diabetes mellitus     Past Surgical History:  Procedure Laterality Date   CARDIAC CATHETERIZATION     01/2010 ; 04/2010    Social History   Socioeconomic History   Marital status: Married    Spouse name: Not on file   Number of children: Not on file   Years of education: Not on file   Highest education level: Not on file  Occupational History   Not on file  Tobacco Use   Smoking status: Every Day    Packs/day: 0.25    Pack years: 0.00    Types: Cigarettes   Smokeless tobacco: Never  Vaping Use   Vaping Use: Never used  Substance and Sexual Activity   Alcohol use: No   Drug use: No   Sexual activity: Not on file  Other Topics Concern   Not on file  Social History Narrative   Not on file   Social Determinants of Health   Financial Resource Strain: Not on file  Food Insecurity: Not on file  Transportation Needs: Not on file  Physical Activity: Not on file  Stress: Not on file  Social Connections: Not on file  Intimate Partner Violence: Not on file    Current Outpatient Medications on File Prior to Visit  Medication Sig Dispense Refill   Accu-Chek Softclix Lancets lancets Use to check blood sugar three times daily. 100 each 6   aspirin 81 MG tablet Take 81 mg by mouth daily.     gabapentin (NEURONTIN) 100 MG capsule TAKE  1 CAPSULE (100 MG TOTAL) BY MOUTH 3 (THREE) TIMES DAILY. 270 capsule 1   insulin glargine (LANTUS) 100 UNIT/ML Solostar Pen Inject 60 Units into the skin every morning. 60 mL 3   Insulin Pen Needle (TRUEPLUS PEN NEEDLES) 32G X 4 MM MISC Use as instructed to inject Lantus and Victoza once daily. 100 each 0   losartan-hydrochlorothiazide (HYZAAR) 100-25 MG tablet TAKE 1 TABLET BY MOUTH DAILY. 90 tablet 0   rosuvastatin (CRESTOR) 20 MG tablet TAKE 1 TABLET (20 MG TOTAL) BY MOUTH DAILY. 90 tablet 1   No current facility-administered medications on file prior to visit.    No Known Allergies  Family History  Problem Relation Age of Onset   Diabetes Neg Hx     BP 130/60   Pulse 70   Ht 5\' 6"  (1.676 m)   Wt 133 lb 6.4 oz (60.5 kg)   SpO2 99%   BMI 21.53 kg/m   Review of Systems Denies n/v    Objective:   Physical Exam Pulses: dorsalis pedis intact bilat.   MSK: no deformity of the feet CV: trace bilat leg edema.   Skin:  no ulcer on the feet.  normal color and temp on the feet.   Neuro: sensation is intact to touch  on the feet, but decreased from normal.     A1c=10.7%    Assessment & Plan:  Insulin-requiring type 2 DM: uncontrolled Staff has called [facility], and they say pt can pick up Lantus today.    Patient Instructions  check your blood sugar twice a day.  vary the time of day when you check, between before the 3 meals, and at bedtime.  also check if you have symptoms of your blood sugar being too high or too low.  please keep a record of the readings and bring it to your next appointment here (or you can bring the meter itself).  You can write it on any piece of paper.  please call us sooner if your blood sugar goes below 70, or if most of your readings are over 200. We will need to take this complex situation in stages.   For now, please start taking the lantus: 30 units daily (even though the prescription will say 60) , and:  Stop taking the Novolog, Victoza, and metformin.   Please come back for a follow-up appointment in 2 months.     ki?m tra l??ng ???ng trong mu c?a b?n hai l?n m?t ngy. thay ??i th?i gian trong ngy khi b?n ki?m tra, gi?a tr??c 3 b?a ?n v tr??c khi ?i ng?. c?ng ki?m tra xem b?n c cc tri?u ch?ng v? l??ng ???ng trong mu c?a b?n qu cao ho?c qu th?p. vui lng ghi l?i cc k?t qu? ?o v mang n ??n cu?c h?n ti?p theo c?a b?n t?i ?y (ho?c b?n c th? t? mang theo my ?o). B?n c th? vi?t n trn b?t k? m?nh gi?y no. Vui lng g?i cho chng ti s?m h?n n?u l??ng ???ng trong mu c?a b?n xu?ng d??i 70, ho?c n?u h?u h?t cc k?t qu? ?o c?a b?n trn 200. Chng ta s? c?n ph?i x? l tnh hu?ng ph?c t?p ny theo t?ng giai ?o?n. Hi?n t?i, vui lng b?t ??u dng thu?c Lantus: 30 ??n v? m?i ngy (m?c d ??n thu?c ghi l 60), v: Ng?ng dng Novolog, Victoza v metformin. Vui lng ??n h?n ti khm sau 2 thng.

## 2020-08-04 NOTE — Patient Instructions (Addendum)
check your blood sugar twice a day.  vary the time of day when you check, between before the 3 meals, and at bedtime.  also check if you have symptoms of your blood sugar being too high or too low.  please keep a record of the readings and bring it to your next appointment here (or you can bring the meter itself).  You can write it on any piece of paper.  please call us sooner if your blood sugar goes below 70, or if most of your readings are over 200. We will need to take this complex situation in stages.   For now, please start taking the lantus: 30 units daily (even though the prescription will say 60) , and:  Stop taking the Novolog, Victoza, and metformin.  Please come back for a follow-up appointment in 2 months.     ki?m tra l??ng ???ng trong mu c?a b?n hai l?n m?t ngy. thay ??i th?i gian trong ngy khi b?n ki?m tra, gi?a tr??c 3 b?a ?n v tr??c khi ?i ng?. c?ng ki?m tra xem b?n c cc tri?u ch?ng v? l??ng ???ng trong mu c?a b?n qu cao ho?c qu th?p. vui lng ghi l?i cc k?t qu? ?o v mang n ??n cu?c h?n ti?p theo c?a b?n t?i ?y (ho?c b?n c th? t? mang theo my ?o). B?n c th? vi?t n trn b?t k? m?nh gi?y no. Vui lng g?i cho chng ti s?m h?n n?u l??ng ???ng trong mu c?a b?n xu?ng d??i 70, ho?c n?u h?u h?t cc k?t qu? ?o c?a b?n trn 200. Chng ta s? c?n ph?i x? l tnh hu?ng ph?c t?p ny theo t?ng giai ?o?n. Hi?n t?i, vui lng b?t ??u dng thu?c Lantus: 30 ??n v? m?i ngy (m?c d ??n thu?c ghi l 60), v: Ng?ng dng Novolog, Victoza v metformin. Vui lng ??n h?n ti khm sau 2 thng.

## 2020-08-20 ENCOUNTER — Inpatient Hospital Stay (HOSPITAL_COMMUNITY): Payer: Medicare HMO

## 2020-08-20 ENCOUNTER — Encounter: Payer: Self-pay | Admitting: Internal Medicine

## 2020-08-20 ENCOUNTER — Other Ambulatory Visit: Payer: Self-pay

## 2020-08-20 ENCOUNTER — Emergency Department (HOSPITAL_COMMUNITY): Payer: Medicare HMO

## 2020-08-20 ENCOUNTER — Encounter (HOSPITAL_COMMUNITY): Payer: Self-pay | Admitting: Internal Medicine

## 2020-08-20 ENCOUNTER — Inpatient Hospital Stay (HOSPITAL_COMMUNITY)
Admission: EM | Admit: 2020-08-20 | Discharge: 2020-08-31 | DRG: 233 | Disposition: A | Discharge status: Home Health | Payer: Medicare HMO | Attending: Thoracic Surgery (Cardiothoracic Vascular Surgery) | Admitting: Thoracic Surgery (Cardiothoracic Vascular Surgery)

## 2020-08-20 DIAGNOSIS — F17200 Nicotine dependence, unspecified, uncomplicated: Secondary | ICD-10-CM | POA: Diagnosis present

## 2020-08-20 DIAGNOSIS — I700 Atherosclerosis of aorta: Secondary | ICD-10-CM | POA: Diagnosis not present

## 2020-08-20 DIAGNOSIS — E1169 Type 2 diabetes mellitus with other specified complication: Secondary | ICD-10-CM | POA: Diagnosis present

## 2020-08-20 DIAGNOSIS — M549 Dorsalgia, unspecified: Secondary | ICD-10-CM | POA: Diagnosis not present

## 2020-08-20 DIAGNOSIS — I25110 Atherosclerotic heart disease of native coronary artery with unstable angina pectoris: Secondary | ICD-10-CM | POA: Diagnosis not present

## 2020-08-20 DIAGNOSIS — I455 Other specified heart block: Secondary | ICD-10-CM

## 2020-08-20 DIAGNOSIS — Z79899 Other long term (current) drug therapy: Secondary | ICD-10-CM | POA: Diagnosis not present

## 2020-08-20 DIAGNOSIS — R079 Chest pain, unspecified: Secondary | ICD-10-CM | POA: Diagnosis not present

## 2020-08-20 DIAGNOSIS — Q278 Other specified congenital malformations of peripheral vascular system: Secondary | ICD-10-CM | POA: Diagnosis not present

## 2020-08-20 DIAGNOSIS — E11649 Type 2 diabetes mellitus with hypoglycemia without coma: Secondary | ICD-10-CM | POA: Diagnosis not present

## 2020-08-20 DIAGNOSIS — I5041 Acute combined systolic (congestive) and diastolic (congestive) heart failure: Secondary | ICD-10-CM | POA: Diagnosis not present

## 2020-08-20 DIAGNOSIS — I255 Ischemic cardiomyopathy: Secondary | ICD-10-CM | POA: Diagnosis present

## 2020-08-20 DIAGNOSIS — Z7982 Long term (current) use of aspirin: Secondary | ICD-10-CM | POA: Diagnosis not present

## 2020-08-20 DIAGNOSIS — F17210 Nicotine dependence, cigarettes, uncomplicated: Secondary | ICD-10-CM | POA: Diagnosis present

## 2020-08-20 DIAGNOSIS — E785 Hyperlipidemia, unspecified: Secondary | ICD-10-CM | POA: Diagnosis not present

## 2020-08-20 DIAGNOSIS — I517 Cardiomegaly: Secondary | ICD-10-CM | POA: Diagnosis not present

## 2020-08-20 DIAGNOSIS — J810 Acute pulmonary edema: Secondary | ICD-10-CM | POA: Diagnosis not present

## 2020-08-20 DIAGNOSIS — E782 Mixed hyperlipidemia: Secondary | ICD-10-CM | POA: Diagnosis present

## 2020-08-20 DIAGNOSIS — J939 Pneumothorax, unspecified: Secondary | ICD-10-CM

## 2020-08-20 DIAGNOSIS — I10 Essential (primary) hypertension: Secondary | ICD-10-CM | POA: Diagnosis not present

## 2020-08-20 DIAGNOSIS — I110 Hypertensive heart disease with heart failure: Secondary | ICD-10-CM | POA: Diagnosis present

## 2020-08-20 DIAGNOSIS — Z20822 Contact with and (suspected) exposure to covid-19: Secondary | ICD-10-CM | POA: Diagnosis present

## 2020-08-20 DIAGNOSIS — E1165 Type 2 diabetes mellitus with hyperglycemia: Secondary | ICD-10-CM | POA: Diagnosis present

## 2020-08-20 DIAGNOSIS — E876 Hypokalemia: Secondary | ICD-10-CM | POA: Diagnosis not present

## 2020-08-20 DIAGNOSIS — J90 Pleural effusion, not elsewhere classified: Secondary | ICD-10-CM | POA: Diagnosis not present

## 2020-08-20 DIAGNOSIS — IMO0001 Reserved for inherently not codable concepts without codable children: Secondary | ICD-10-CM | POA: Diagnosis present

## 2020-08-20 DIAGNOSIS — I252 Old myocardial infarction: Secondary | ICD-10-CM

## 2020-08-20 DIAGNOSIS — D62 Acute posthemorrhagic anemia: Secondary | ICD-10-CM | POA: Diagnosis not present

## 2020-08-20 DIAGNOSIS — R739 Hyperglycemia, unspecified: Secondary | ICD-10-CM | POA: Diagnosis not present

## 2020-08-20 DIAGNOSIS — I5043 Acute on chronic combined systolic (congestive) and diastolic (congestive) heart failure: Secondary | ICD-10-CM | POA: Diagnosis not present

## 2020-08-20 DIAGNOSIS — I5020 Unspecified systolic (congestive) heart failure: Secondary | ICD-10-CM

## 2020-08-20 DIAGNOSIS — E43 Unspecified severe protein-calorie malnutrition: Secondary | ICD-10-CM | POA: Insufficient documentation

## 2020-08-20 DIAGNOSIS — I509 Heart failure, unspecified: Secondary | ICD-10-CM

## 2020-08-20 DIAGNOSIS — J9811 Atelectasis: Secondary | ICD-10-CM | POA: Diagnosis not present

## 2020-08-20 DIAGNOSIS — I5021 Acute systolic (congestive) heart failure: Secondary | ICD-10-CM | POA: Diagnosis not present

## 2020-08-20 DIAGNOSIS — N179 Acute kidney failure, unspecified: Secondary | ICD-10-CM | POA: Diagnosis present

## 2020-08-20 DIAGNOSIS — Z950 Presence of cardiac pacemaker: Secondary | ICD-10-CM

## 2020-08-20 DIAGNOSIS — Z794 Long term (current) use of insulin: Secondary | ICD-10-CM | POA: Diagnosis not present

## 2020-08-20 DIAGNOSIS — R0602 Shortness of breath: Secondary | ICD-10-CM | POA: Insufficient documentation

## 2020-08-20 DIAGNOSIS — Z01810 Encounter for preprocedural cardiovascular examination: Secondary | ICD-10-CM | POA: Diagnosis not present

## 2020-08-20 DIAGNOSIS — R0989 Other specified symptoms and signs involving the circulatory and respiratory systems: Secondary | ICD-10-CM | POA: Diagnosis not present

## 2020-08-20 DIAGNOSIS — I340 Nonrheumatic mitral (valve) insufficiency: Secondary | ICD-10-CM | POA: Diagnosis not present

## 2020-08-20 DIAGNOSIS — R531 Weakness: Secondary | ICD-10-CM | POA: Diagnosis not present

## 2020-08-20 DIAGNOSIS — Z951 Presence of aortocoronary bypass graft: Secondary | ICD-10-CM

## 2020-08-20 DIAGNOSIS — I2510 Atherosclerotic heart disease of native coronary artery without angina pectoris: Secondary | ICD-10-CM | POA: Diagnosis present

## 2020-08-20 DIAGNOSIS — Z955 Presence of coronary angioplasty implant and graft: Secondary | ICD-10-CM | POA: Diagnosis not present

## 2020-08-20 DIAGNOSIS — I214 Non-ST elevation (NSTEMI) myocardial infarction: Secondary | ICD-10-CM | POA: Diagnosis not present

## 2020-08-20 DIAGNOSIS — J811 Chronic pulmonary edema: Secondary | ICD-10-CM | POA: Diagnosis not present

## 2020-08-20 DIAGNOSIS — K551 Chronic vascular disorders of intestine: Secondary | ICD-10-CM | POA: Diagnosis not present

## 2020-08-20 HISTORY — DX: Essential (primary) hypertension: I10

## 2020-08-20 HISTORY — DX: Hyperlipidemia, unspecified: E78.5

## 2020-08-20 HISTORY — DX: Atherosclerotic heart disease of native coronary artery without angina pectoris: I25.10

## 2020-08-20 LAB — GLUCOSE, CAPILLARY
Glucose-Capillary: 137 mg/dL — ABNORMAL HIGH (ref 70–99)
Glucose-Capillary: 56 mg/dL — ABNORMAL LOW (ref 70–99)
Glucose-Capillary: 253 mg/dL — ABNORMAL HIGH (ref 70–99)

## 2020-08-20 LAB — PROTIME-INR
INR: 1 (ref 0.8–1.2)
Prothrombin Time: 13.5 seconds (ref 11.4–15.2)

## 2020-08-20 LAB — ECHOCARDIOGRAM COMPLETE
Area-P 1/2: 5.97 cm2
Calc EF: 31.5 %
Height: 64 in
MV M vel: 5.15 m/s
MV Peak grad: 106.1 mmHg
Radius: 0.45 cm
S' Lateral: 4.6 cm
Single Plane A2C EF: 33.5 %
Single Plane A4C EF: 28.1 %
Weight: 2292.78 oz

## 2020-08-20 LAB — CBG MONITORING, ED
Glucose-Capillary: 275 mg/dL — ABNORMAL HIGH (ref 70–99)
Glucose-Capillary: 136 mg/dL — ABNORMAL HIGH (ref 70–99)
Glucose-Capillary: 147 mg/dL — ABNORMAL HIGH (ref 70–99)

## 2020-08-20 LAB — CBC
HCT: 39.3 % (ref 39.0–52.0)
Hemoglobin: 13.4 g/dL (ref 13.0–17.0)
MCH: 31.4 pg (ref 26.0–34.0)
MCHC: 34.1 g/dL (ref 30.0–36.0)
MCV: 92 fL (ref 80.0–100.0)
Platelets: 227 10*3/uL (ref 150–400)
RBC: 4.27 MIL/uL (ref 4.22–5.81)
RDW: 13.1 % (ref 11.5–15.5)
WBC: 10.5 10*3/uL (ref 4.0–10.5)
nRBC: 0 % (ref 0.0–0.2)

## 2020-08-20 LAB — BASIC METABOLIC PANEL
Anion gap: 5 (ref 5–15)
BUN: 17 mg/dL (ref 8–23)
CO2: 26 mmol/L (ref 22–32)
Calcium: 9.6 mg/dL (ref 8.9–10.3)
Chloride: 107 mmol/L (ref 98–111)
Creatinine, Ser: 1.19 mg/dL (ref 0.61–1.24)
GFR, Estimated: >60 mL/min (ref >60)
Glucose, Bld: 350 mg/dL — ABNORMAL HIGH (ref 70–99)
Potassium: 3.9 mmol/L (ref 3.5–5.1)
Sodium: 138 mmol/L (ref 135–145)

## 2020-08-20 LAB — RESP PANEL BY RT-PCR (FLU A&B, COVID) ARPGX2
Influenza A by PCR: NEGATIVE
Influenza B by PCR: NEGATIVE
SARS Coronavirus 2 by RT PCR: NEGATIVE

## 2020-08-20 LAB — BRAIN NATRIURETIC PEPTIDE: B Natriuretic Peptide: 1079.7 pg/mL — ABNORMAL HIGH (ref 0.0–100.0)

## 2020-08-20 LAB — HEPARIN LEVEL (UNFRACTIONATED): Heparin Unfractionated: 0.96 IU/mL — ABNORMAL HIGH (ref 0.30–0.70)

## 2020-08-20 LAB — TROPONIN I (HIGH SENSITIVITY)
Troponin I (High Sensitivity): 208 ng/L (ref <18)
Troponin I (High Sensitivity): 195 ng/L (ref <18)
Troponin I (High Sensitivity): 801 ng/L (ref <18)
Troponin I (High Sensitivity): 970 ng/L (ref <18)

## 2020-08-20 LAB — HIV ANTIBODY (ROUTINE TESTING W REFLEX): HIV Screen 4th Generation wRfx: NONREACTIVE

## 2020-08-20 MED ORDER — BISACODYL 5 MG PO TBEC: 5.0000 mg | DELAYED_RELEASE_TABLET | Freq: Every day | ORAL | Status: DC | PRN

## 2020-08-20 MED ORDER — ROSUVASTATIN CALCIUM 20 MG PO TABS: 20.0000 mg | ORAL_TABLET | Freq: Every day | ORAL | Status: DC

## 2020-08-20 MED ORDER — INSULIN ASPART 100 UNIT/ML IJ SOLN
0.0000 [IU] | Freq: Every day | INTRAMUSCULAR | Status: DC
  Administered 2020-08-20: 3 [IU] via SUBCUTANEOUS
  Administered 2020-08-21 – 2020-08-22 (×2): 2 [IU] via SUBCUTANEOUS

## 2020-08-20 MED ORDER — DAPAGLIFLOZIN PROPANEDIOL 10 MG PO TABS: 10.0000 mg | ORAL_TABLET | Freq: Every day | ORAL | Status: DC

## 2020-08-20 MED ORDER — INSULIN ASPART 100 UNIT/ML IJ SOLN
0.0000 [IU] | Freq: Three times a day (TID) | INTRAMUSCULAR | Status: DC
  Administered 2020-08-20: 2 [IU] via SUBCUTANEOUS
  Administered 2020-08-21 (×2): 8 [IU] via SUBCUTANEOUS
  Administered 2020-08-22: 3 [IU] via SUBCUTANEOUS
  Administered 2020-08-23: 5 [IU] via SUBCUTANEOUS

## 2020-08-20 MED ORDER — INSULIN GLARGINE 100 UNIT/ML SOLOSTAR PEN: 60.0000 [IU] | PEN_INJECTOR | SUBCUTANEOUS | Status: DC

## 2020-08-20 MED ORDER — ACETAMINOPHEN 650 MG RE SUPP: 650.0000 mg | Freq: Four times a day (QID) | RECTAL | Status: DC | PRN

## 2020-08-20 MED ORDER — INSULIN ASPART 100 UNIT/ML IJ SOLN
5.0000 [IU] | Freq: Once | INTRAMUSCULAR | Status: AC
  Administered 2020-08-20: 5 [IU] via SUBCUTANEOUS

## 2020-08-20 MED ORDER — FUROSEMIDE 10 MG/ML IJ SOLN
40.0000 mg | Freq: Two times a day (BID) | INTRAMUSCULAR | Status: DC
  Administered 2020-08-20 – 2020-08-21 (×2): 40 mg via INTRAVENOUS
  Filled 2020-08-20 (×2): qty 4

## 2020-08-20 MED ORDER — ALBUTEROL SULFATE HFA 108 (90 BASE) MCG/ACT IN AERS
2.0000 | INHALATION_SPRAY | Freq: Once | RESPIRATORY_TRACT | Status: AC
  Administered 2020-08-20: 2 via RESPIRATORY_TRACT
  Filled 2020-08-20: qty 6.7

## 2020-08-20 MED ORDER — DOCUSATE SODIUM 100 MG PO CAPS
100.0000 mg | ORAL_CAPSULE | Freq: Two times a day (BID) | ORAL | Status: DC
  Administered 2020-08-20 – 2020-08-22 (×5): 100 mg via ORAL
  Filled 2020-08-20 (×5): qty 1

## 2020-08-20 MED ORDER — ONDANSETRON HCL 4 MG/2ML IJ SOLN
4.0000 mg | Freq: Once | INTRAMUSCULAR | Status: AC
  Administered 2020-08-20: 4 mg via INTRAVENOUS
  Filled 2020-08-20: qty 2

## 2020-08-20 MED ORDER — OXYCODONE HCL 5 MG PO TABS: 5.0000 mg | ORAL_TABLET | ORAL | Status: DC | PRN

## 2020-08-20 MED ORDER — ACETAMINOPHEN 325 MG PO TABS: 650.0000 mg | ORAL_TABLET | Freq: Four times a day (QID) | ORAL | Status: DC | PRN

## 2020-08-20 MED ORDER — HYDRALAZINE HCL 20 MG/ML IJ SOLN: 5.0000 mg | INTRAMUSCULAR | Status: DC | PRN

## 2020-08-20 MED ORDER — POLYETHYLENE GLYCOL 3350 17 G PO PACK: 17.0000 g | PACK | Freq: Every day | ORAL | Status: DC | PRN

## 2020-08-20 MED ORDER — ASPIRIN EC 81 MG PO TBEC
81.0000 mg | DELAYED_RELEASE_TABLET | Freq: Every day | ORAL | Status: DC
  Administered 2020-08-20 – 2020-08-21 (×2): 81 mg via ORAL
  Filled 2020-08-20 (×2): qty 1

## 2020-08-20 MED ORDER — HEPARIN BOLUS VIA INFUSION
4000.0000 [IU] | Freq: Once | INTRAVENOUS | Status: AC
  Administered 2020-08-20: 4000 [IU] via INTRAVENOUS
  Filled 2020-08-20: qty 4000

## 2020-08-20 MED ORDER — METOPROLOL TARTRATE 12.5 MG HALF TABLET
12.5000 mg | ORAL_TABLET | Freq: Two times a day (BID) | ORAL | Status: DC
  Administered 2020-08-20: 12.5 mg via ORAL
  Filled 2020-08-20: qty 1

## 2020-08-20 MED ORDER — TRAZODONE HCL 50 MG PO TABS: 25.0000 mg | ORAL_TABLET | Freq: Every evening | ORAL | Status: DC | PRN

## 2020-08-20 MED ORDER — NITROGLYCERIN IN D5W 200-5 MCG/ML-% IV SOLN
0.0000 ug/min | INTRAVENOUS | Status: DC
Start: 2020-08-20 — End: 2020-08-23
  Administered 2020-08-20: 5 ug/min via INTRAVENOUS
  Filled 2020-08-20: qty 250

## 2020-08-20 MED ORDER — INSULIN GLARGINE 100 UNIT/ML ~~LOC~~ SOLN
60.0000 [IU] | Freq: Every day | SUBCUTANEOUS | Status: DC
  Administered 2020-08-20: 60 [IU] via SUBCUTANEOUS
  Filled 2020-08-20 (×2): qty 0.6

## 2020-08-20 MED ORDER — GABAPENTIN 100 MG PO CAPS
100.0000 mg | ORAL_CAPSULE | Freq: Three times a day (TID) | ORAL | Status: DC
  Administered 2020-08-20 – 2020-08-31 (×31): 100 mg via ORAL
  Filled 2020-08-20 (×31): qty 1

## 2020-08-20 MED ORDER — ONDANSETRON HCL 4 MG/2ML IJ SOLN: 4.0000 mg | Freq: Four times a day (QID) | INTRAMUSCULAR | Status: DC | PRN

## 2020-08-20 MED ORDER — MORPHINE SULFATE (PF) 2 MG/ML IV SOLN: 2.0000 mg | INTRAVENOUS | Status: DC | PRN

## 2020-08-20 MED ORDER — SODIUM CHLORIDE 0.9% FLUSH
3.0000 mL | Freq: Two times a day (BID) | INTRAVENOUS | Status: DC
  Administered 2020-08-21 – 2020-08-22 (×2): 3 mL via INTRAVENOUS

## 2020-08-20 MED ORDER — MORPHINE SULFATE (PF) 4 MG/ML IV SOLN
4.0000 mg | Freq: Once | INTRAVENOUS | Status: AC
  Administered 2020-08-20: 4 mg via INTRAVENOUS
  Filled 2020-08-20: qty 1

## 2020-08-20 MED ORDER — DAPAGLIFLOZIN PROPANEDIOL 10 MG PO TABS
10.0000 mg | ORAL_TABLET | Freq: Every day | ORAL | Status: DC
  Administered 2020-08-20 – 2020-08-23 (×3): 10 mg via ORAL
  Filled 2020-08-20 (×4): qty 1

## 2020-08-20 MED ORDER — ROSUVASTATIN CALCIUM 20 MG PO TABS
40.0000 mg | ORAL_TABLET | Freq: Every day | ORAL | Status: DC
  Administered 2020-08-21 – 2020-08-30 (×10): 40 mg via ORAL
  Filled 2020-08-20 (×10): qty 2

## 2020-08-20 MED ORDER — HEPARIN (PORCINE) 25000 UT/250ML-% IV SOLN
1100.0000 [IU]/h | INTRAVENOUS | Status: DC
  Administered 2020-08-20: 900 [IU]/h via INTRAVENOUS
  Administered 2020-08-21: 850 [IU]/h via INTRAVENOUS
  Administered 2020-08-22: 1100 [IU]/h via INTRAVENOUS
  Filled 2020-08-20 (×2): qty 250

## 2020-08-20 MED ORDER — IOHEXOL 350 MG/ML SOLN
100.0000 mL | Freq: Once | INTRAVENOUS | Status: AC | PRN
  Administered 2020-08-20: 100 mL via INTRAVENOUS

## 2020-08-20 MED ORDER — ONDANSETRON HCL 4 MG PO TABS: 4.0000 mg | ORAL_TABLET | Freq: Four times a day (QID) | ORAL | Status: DC | PRN

## 2020-08-20 MED ORDER — FUROSEMIDE 10 MG/ML IJ SOLN
40.0000 mg | Freq: Once | INTRAMUSCULAR | Status: AC
  Administered 2020-08-20: 40 mg via INTRAVENOUS
  Filled 2020-08-20: qty 4

## 2020-08-20 NOTE — Progress Notes (Signed)
Cardiology on-call:   Patient seen and examined at bedside at 3:09pm.   Reviewed his ECG since morning rounds which illustrate dynamic ECG changes. However, patient is not having active chest pain.   VS: 110/65, 78bpm, 98% on RA.  No change in physical examination compared to this morning.   Video interpretation was not effective in the morning as he did not understand.   ER nurse tried to call the sons but no response.   Nurse tech Hnhu is able to speak his language and patient denies chest pain or shortness of breath. He feels "exhausted."  Spoke to interventional cardiologist and shared decision was repeat troponin given the EKG changes and Echo findings.   Patient is asked to inform his nurse if he develops chest pain, shortness of breath.   EKG repeated and reviewed.   In the intermin, Hnhu was able to get hold of one of the son's ([name] [phone]) he is informed that patient rules in for NSTEMI and EKG. If he has worsening symptoms of angina, requires further uptitration of Nitro gtt, meets STEMI criteria would recommend left heart catheterization with possible intervention sooner than Monday.    Initially spoke to the son and explained the indication, alternatives, risks and benefits of left heart catheterization with possible intervention. Complications include but not limited to bleeding, infection, vascular injury, stroke, myocardial infection, arrhythmia, kidney injury requiring short-term or permanent hemodialysis, radiation-related injury in the case of prolonged fluoroscopy use, emergency cardiac surgery, and death. The patient understands the risks of serious complication is 1-2 in 1000 with diagnostic cardiac cath and 1-2% or less with angioplasty/stenting.   [name] reviewed the consent with Mr. Maden over the phone and the patient voiced understanding and provided verbal feedback and is in agreement. The informed consent was obtain in the presence of his RN, Hnhu, and myself.    Will d/c lopressor for now give the ECG changes and echo findings.   Will continue diuresis given both systolic and diastolic heart failure and findings suggestive of elevated LAP and echo.  Initiate Farxiga 10 mg p.o. daily.  Continue IV Heparin and Nito gtt.   Complex medical decision given his presentation, reviewed labs/ekgs/echo findings, interpreter was needed for the encounter, obtaining informed consent after educating patient and his son w/ regards to disease process and current workup, and coordination of care.    CRITICAL CARE Performed by: Tessa Lerner   Total critical care time: 55 minutes   Critical care time was exclusive of separately billable procedures and treating other patients.   Critical care was necessary to treat or prevent imminent or life-threatening deterioration.   Critical care was time spent personally by me on the following activities: development of treatment plan with patient, his sons (hard to  reach), nurse tech [facility]) who served as a Ecologist, as well as nursing, discussions with interventional cardiologist on-call, evaluation of patient's response to treatment, examination of patient, obtaining history from patient or surrogate, ordering and performing treatments and interventions, ordering and review of laboratory studies, ordering and review of radiographic studies, pulse oximetry and re-evaluation of patient's condition.  Tessa Lerner, [place], [facility] [address]  Pager: [phone] Office: [phone]

## 2020-08-20 NOTE — Progress Notes (Signed)
ANTICOAGULATION CONSULT NOTE   Pharmacy Consult for Heparin Indication: chest pain/ACS  No Known Allergies  Patient Measurements: Height: 5\' 4"  (162.6 cm) Weight: 65 kg (143 lb 4.8 oz) IBW/kg (Calculated) : 59.2 Heparin Dosing Weight: 65 kg  Vital Signs: Temp: 98.2 F (36.8 C) (07/16 0544) BP: 136/91 (07/16 1600) Pulse Rate: 65 (07/16 1430)  Labs: Recent Labs    08/20/20 0449 08/20/20 0628 08/20/20 1632  HGB 13.4  --   --   HCT 39.3  --   --   PLT 227  --   --   LABPROT 13.5  --   --   INR 1.0  --   --   HEPARINUNFRC  --   --  0.96*  CREATININE 1.19  --   --   TROPONINIHS 208* 195*  --      Estimated Creatinine Clearance: 48.4 mL/min (by C-G formula based on SCr of 1.19 mg/dL).   Medical History: Past Medical History:  Diagnosis Date   Arrhythmia    CAD (coronary artery disease)    stent x 2   Carotid artery occlusion    Diabetes mellitus    Dyslipidemia    Essential hypertension     Medications:  Await electronic med rec  Assessment: 70 y.o. M presents with SOB. Trop elevated to 195. To begin heparin for ACS. No AC PTA. CBC ok on admission.  Heparin level came back supratherapeutic at 0.96, on 900 units/hr. Drawn from opposite side. No s/sx of bleeding or infusion issues per nursing.   Goal of Therapy:  Heparin level 0.3-0.7 units/ml Monitor platelets by anticoagulation protocol: Yes   Plan:  Reduce heparin gtt at 700 units/hr Will f/u heparin level in 8 hours Daily heparin level and CBC  66, PharmD, BCCCP Clinical Pharmacist  Phone: [phone] 08/20/2020 5:41 PM  Please check AMION for all [facility] phone numbers After 10:00 PM, call Main Pharmacy [phone]

## 2020-08-20 NOTE — ED Triage Notes (Signed)
Patient reports left chest pain with SOB onset yesterday , no emesis or diaphoresis , denies cough or fever .

## 2020-08-20 NOTE — Progress Notes (Signed)
ANTICOAGULATION CONSULT NOTE - Initial Consult  Pharmacy Consult for Heparin Indication: chest pain/ACS  No Known Allergies  Patient Measurements: Height: 5\' 4"  (162.6 cm) Weight: 65 kg (143 lb 4.8 oz) IBW/kg (Calculated) : 59.2 Heparin Dosing Weight: 65 kg  Vital Signs: Temp: 98.2 F (36.8 C) (07/16 0544) Temp Source: Oral (07/16 0441) BP: 137/84 (07/16 0730) Pulse Rate: 88 (07/16 0730)  Labs: Recent Labs    08/20/20 0449 08/20/20 0628  HGB 13.4  --   HCT 39.3  --   PLT 227  --   LABPROT 13.5  --   INR 1.0  --   CREATININE 1.19  --   TROPONINIHS 208* 195*    Estimated Creatinine Clearance: 48.4 mL/min (by C-G formula based on SCr of 1.19 mg/dL).   Medical History: Past Medical History:  Diagnosis Date   Arrhythmia    Carotid artery occlusion    Diabetes mellitus     Medications:  Await electronic med rec  Assessment: 70 y.o. M presents with SOB. Trop elevated to 195. To begin heparin for ACS. No AC PTA. CBC ok on admission.  Goal of Therapy:  Heparin level 0.3-0.7 units/ml Monitor platelets by anticoagulation protocol: Yes   Plan:  Heparin IV bolus 4000 units Heparin gtt at 900 units/hr Will f/u heparin level in 8 hours Daily heparin level and CBC  66, PharmD, BCPS Please see amion for complete clinical pharmacist phone list 08/20/2020,7:48 AM

## 2020-08-20 NOTE — H&P (Addendum)
History and Physical    Colin [name] YIF:[id number] DOB: 1950-10-01 DOA: 08/20/2020  PCP: Kerin Perna, NP Consultants:  Loanne Drilling - endocrinology Patient coming from:  Home - lives with wife and other family; NOK: Carlota Raspberry, [place]  Chief Complaint: chest pain  HPI: Colin [name] is a 70 y.o. male with medical history significant of HTN; HLD; and DM presenting with CP/SOB.  He reports chest pain starting yesterday. Also with weakness, SOB.  Seems to be periodic.  Worse with walking around his house.  Some swelling.  [employer] teleinterpreter and audio [place] interpreter were unavailable at the time of the admission, and the patient's English is quite limited.  I also attempted to call his son, and he was also unavailable.  Per pharmacy tech, the patient misunderstood instructions at his June endocrinology appt.  Instead of stopping all DM meds except for Lantus, he stopped all medications - including all BP medications.   ED Course: New onset CHF +/- ACS.  CP/COB starting yesterday.  Troponin elevated.  Dissection study negative but has vascular congestion, effusions.  Cards recommends [facility] admission due to hyperglycemia.  Review of Systems: As per HPI; otherwise review of systems reviewed and negative.   Ambulatory Status:  Ambulates without assistance  COVID Vaccine Status:  Unknown  Past Medical History:  Diagnosis Date   Arrhythmia    CAD (coronary artery disease)    stent x 2   Carotid artery occlusion    Diabetes mellitus    Dyslipidemia    Essential hypertension     Past Surgical History:  Procedure Laterality Date   CARDIAC CATHETERIZATION     01/2010 ; 04/2010    Social History   Socioeconomic History   Marital status: Married    Spouse name: Not on file   Number of children: Not on file   Years of education: Not on file   Highest education level: Not on file  Occupational History   Not on file  Tobacco Use   Smoking status: Every Day     Packs/day: 0.25    Types: Cigarettes   Smokeless tobacco: Never  Vaping Use   Vaping Use: Never used  Substance and Sexual Activity   Alcohol use: No   Drug use: No   Sexual activity: Not on file  Other Topics Concern   Not on file  Social History Narrative   Not on file   Social Determinants of Health   Financial Resource Strain: Not on file  Food Insecurity: Not on file  Transportation Needs: Not on file  Physical Activity: Not on file  Stress: Not on file  Social Connections: Not on file  Intimate Partner Violence: Not on file    No Known Allergies  Family History  Problem Relation Age of Onset   Diabetes Neg Hx     Prior to Admission medications   Medication Sig Start Date End Date Taking? Authorizing Provider  Accu-Chek Softclix Lancets lancets Use to check blood sugar three times daily. 09/23/19   Charlott Rakes, MD  aspirin 81 MG tablet Take 81 mg by mouth daily.    [provider]  Blood Glucose Monitoring Suppl (ACCU-CHEK GUIDE) w/Device KIT USE AS DIRECTED 08/04/20   Renato Shin, MD  gabapentin (NEURONTIN) 100 MG capsule TAKE 1 CAPSULE (100 MG TOTAL) BY MOUTH 3 (THREE) TIMES DAILY. 05/06/20 05/06/21  Kerin Perna, NP  glucose blood (ACCU-CHEK GUIDE) test strip 1 each by Other route 2 (two) times daily. And  lancets 2/day 08/04/20   Renato Shin, MD  insulin glargine (LANTUS) 100 UNIT/ML Solostar Pen Inject 60 Units into the skin every morning. 05/30/20 05/30/21  Renato Shin, MD  Insulin Pen Needle (TRUEPLUS PEN NEEDLES) 32G X 4 MM MISC Use as instructed to inject Lantus and Victoza once daily. 05/06/20   Meccariello, Bernita Raisin, DO  losartan-hydrochlorothiazide (HYZAAR) 100-25 MG tablet TAKE 1 TABLET BY MOUTH DAILY. 05/06/20 05/06/21  Meccariello, Bernita Raisin, DO  rosuvastatin (CRESTOR) 20 MG tablet TAKE 1 TABLET (20 MG TOTAL) BY MOUTH DAILY. 05/30/20 05/30/21  Kerin Perna, NP    Physical Exam: Vitals:   08/20/20 1100 08/20/20 1130 08/20/20 1139  08/20/20 1200  BP: (!) 102/50 104/67  116/69  Pulse: 82 77  79  Resp:  18  (!) 21  Temp:      TempSrc:      SpO2: 91% 92% 93% 94%  Weight:      Height:         General:  Appears calm and comfortable and is in NAD Eyes:  PERRL, EOMI, normal lids, iris ENT:  grossly normal hearing, lips & tongue, mmm Neck:  no LAD, masses or thyromegaly Cardiovascular:  RRR, no m/r/g. 1+ LE edema.  Respiratory:   CTA bilaterally with no wheezes/rales/rhonchi.  Normal respiratory effort. Abdomen:  soft, +midepigastric TTP, ND Skin:  no rash or induration seen on limited exam Musculoskeletal:  grossly normal tone BUE/BLE, good ROM, no bony abnormality Lower extremity:  1+ LE edema.  Limited foot exam with no ulcerations.  2+ distal pulses. Psychiatric:  grossly normal mood and affect, speech appropriate Neurologic:  CN 2-12 grossly intact, moves all extremities in coordinated fashion    Radiological Exams on Admission: Independently reviewed - see discussion in A/P where applicable  DG Chest 2 View  Result Date: 08/20/2020 CLINICAL DATA:  70 year old male with chest pain on the left. Shortness of breath since yesterday. Denies cough or fever. EXAM: CHEST - 2 VIEW COMPARISON:  Chest radiographs 05/06/2020 and earlier. FINDINGS: Lung volumes and mediastinal contours are within normal limits. On the lateral view there is trace pleural fluid in the fissures, and there is acute diffuse increased interstitial opacity in both lungs. Kerley B-lines identified in both lung periphery. Normal cardiac size and mediastinal contours. Visualized tracheal air column is within normal limits. No pneumothorax. No acute osseous abnormality identified. Paucity of bowel gas in the upper abdomen. IMPRESSION: Acute pulmonary interstitial edema with trace pleural fluid. Electronically Signed   By: Genevie Ann M.D.   On: 08/20/2020 05:28   ECHOCARDIOGRAM COMPLETE  Result Date: 08/20/2020    ECHOCARDIOGRAM REPORT   Patient Name:    [name] [facility] Date of Exam: 08/20/2020 Medical Rec #:  [id number]    Height:       64.0 in Accession #:    [id number]   Weight:       143.3 lb Date of Birth:  01/18/1951    BSA:          1.698 m Patient Age:    22 years     BP:           104/67 mmHg Patient Gender: M            HR:           82 bpm. Exam Location:  Inpatient Procedure: 2D Echo Indications:     chest pain  History:         Patient has no prior history  of Echocardiogram examinations.                  CAD; Risk Factors:Diabetes, Hypertension and Dyslipidemia.  Sonographer:     Johny Chess RDCS Referring Phys:  [id number] YKZLD TOLIA Diagnosing Phys: Rex Kras DO IMPRESSIONS  1. Left ventricular ejection fraction, by estimation, is 30 to 35%. The left ventricle has moderate to severely decreased function. The left ventricle demonstrates global hypokinesis. The left ventricular internal cavity size was mildly dilated. Left ventricular diastolic parameters are consistent with Grade II diastolic dysfunction (pseudonormalization). Elevated left atrial pressure.  2. Right ventricular systolic function is mildly reduced. The right ventricular size is normal.  3. Left atrial size was mildly dilated.  4. The mitral valve is degenerative. Moderate mitral valve regurgitation. No evidence of mitral stenosis.  5. The aortic valve is tricuspid. Aortic valve regurgitation is not visualized. Mild aortic valve sclerosis is present, with no evidence of aortic valve stenosis.  6. The inferior vena cava is normal in size with greater than 50% respiratory variability, suggesting right atrial pressure of 3 mmHg.  7. Evidence of atrial level shunting detected by color flow Doppler. FINDINGS  Left Ventricle: Left ventricular ejection fraction, by estimation, is 30 to 35%. The left ventricle has moderate to severely decreased function. The left ventricle demonstrates global hypokinesis. The left ventricular internal cavity size was mildly dilated. There is no left  ventricular hypertrophy. Left ventricular diastolic parameters are consistent with Grade II diastolic dysfunction (pseudonormalization). Elevated left atrial pressure.  LV Wall Scoring: The anterior septum, inferior wall, mid inferoseptal segment, and basal inferoseptal segment are hypokinetic. Right Ventricle: The right ventricular size is normal. No increase in right ventricular wall thickness. Right ventricular systolic function is mildly reduced. Left Atrium: Left atrial size was mildly dilated. Right Atrium: Right atrial size was normal in size. Pericardium: There is no evidence of pericardial effusion. Mitral Valve: The mitral valve is degenerative in appearance. There is mild thickening of the mitral valve leaflet(s). Normal mobility of the mitral valve leaflets. Moderate mitral valve regurgitation. No evidence of mitral valve stenosis. Tricuspid Valve: The tricuspid valve is normal in structure. Tricuspid valve regurgitation is trivial. No evidence of tricuspid stenosis. Aortic Valve: The aortic valve is tricuspid. Aortic valve regurgitation is not visualized. Mild aortic valve sclerosis is present, with no evidence of aortic valve stenosis. Pulmonic Valve: The pulmonic valve was grossly normal. Pulmonic valve regurgitation is not visualized. No evidence of pulmonic stenosis. Aorta: The aortic root and ascending aorta are structurally normal, with no evidence of dilitation. Venous: The inferior vena cava is normal in size with greater than 50% respiratory variability, suggesting right atrial pressure of 3 mmHg. IAS/Shunts: Evidence of atrial level shunting detected by color flow Doppler.  LEFT VENTRICLE PLAX 2D LVIDd:         5.20 cm      Diastology LVIDs:         4.60 cm      LV e' medial:    4.35 cm/s LV PW:         1.10 cm      LV E/e' medial:  22.5 LV IVS:        1.00 cm      LV e' lateral:   9.03 cm/s LVOT diam:     1.90 cm      LV E/e' lateral: 10.8 LV SV:         31 LV SV Index:   18 LVOT Area:  2.84 cm  LV Volumes (MOD) LV vol d, MOD A2C: 120.0 ml LV vol d, MOD A4C: 107.0 ml LV vol s, MOD A2C: 79.8 ml LV vol s, MOD A4C: 76.9 ml LV SV MOD A2C:     40.2 ml LV SV MOD A4C:     107.0 ml LV SV MOD BP:      36.1 ml RIGHT VENTRICLE             IVC RV S prime:     10.00 cm/s  IVC diam: 1.70 cm TAPSE (M-mode): 1.4 cm LEFT ATRIUM             Index       RIGHT ATRIUM           Index LA diam:        3.90 cm 2.30 cm/m  RA Area:     11.30 cm LA Vol (A2C):   66.6 ml 39.22 ml/m RA Volume:   24.10 ml  14.19 ml/m LA Vol (A4C):   50.8 ml 29.92 ml/m LA Biplane Vol: 58.4 ml 34.40 ml/m  AORTIC VALVE LVOT Vmax:   62.60 cm/s LVOT Vmean:  39.400 cm/s LVOT VTI:    0.109 m  AORTA Ao Root diam: 2.60 cm Ao Asc diam:  3.10 cm MITRAL VALVE MV Area (PHT): 5.97 cm      SHUNTS MV Decel Time: 127 msec      Systemic VTI:  0.11 m MR Peak grad:    106.1 mmHg  Systemic Diam: 1.90 cm MR Mean grad:    63.0 mmHg MR Vmax:         515.00 cm/s MR Vmean:        373.0 cm/s MR PISA:         1.27 cm MR PISA Eff ROA: 10 mm MR PISA Radius:  0.45 cm MV E velocity: 97.70 cm/s MV A velocity: 75.40 cm/s MV E/A ratio:  1.30 Sunit Tolia DO Electronically signed by Rex Kras DO Signature Date/Time: 08/20/2020/12:16:39 PM    Final    CT Angio Chest/Abd/Pel for Dissection W and/or W/WO  Result Date: 08/20/2020 CLINICAL DATA:  70 year old male with history of chest pain and back pain with shortness of breath since yesterday. EXAM: CT ANGIOGRAPHY CHEST, ABDOMEN AND PELVIS TECHNIQUE: Non-contrast CT of the chest was initially obtained. Multidetector CT imaging through the chest, abdomen and pelvis was performed using the standard protocol during bolus administration of intravenous contrast. Multiplanar reconstructed images and MIPs were obtained and reviewed to evaluate the vascular anatomy. CONTRAST:  177m OMNIPAQUE IOHEXOL 350 MG/ML SOLN COMPARISON:  No prior chest CT. CT the abdomen and pelvis 12/08/2010. FINDINGS: CTA CHEST FINDINGS Cardiovascular:  Precontrast images demonstrate no crescentic high attenuation associated with the wall of the thoracic aorta to suggest acute intramural hemorrhage. There is extensive aortic atherosclerosis, without definite evidence of aneurysm or dissection of the thoracic aorta on today's non cardiac gated examination. Ascending aorta, mid aortic arch and descending aorta measure 3.2 cm, 2.5 cm and 2.6 cm in diameter respectively. Atherosclerotic calcifications are also noted in the left main, left anterior descending, left circumflex and right coronary arteries. Calcifications of the aortic valve. 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 Heart size is enlarged with left ventricular dilatation. There is no significant pericardial fluid, thickening or pericardial calcification. Mediastinum/Nodes: No pathologically enlarged mediastinal or hilar lymph nodes. Esophagus is unremarkable in appearance. No axillary lymphadenopathy. Lungs/Pleura: Moderate right and small left pleural effusions  lying dependently. Patchy areas of mild ground-glass attenuation and diffuse interlobular septal thickening in the lungs, most compatible with a background of interstitial pulmonary edema. No confluent consolidative airspace disease. No pleural effusions. No definite suspicious appearing pulmonary nodules or masses are noted. Musculoskeletal: There are no aggressive appearing lytic or blastic lesions noted in the visualized portions of the skeleton. Review of the MIP images confirms the above findings. CTA ABDOMEN AND PELVIS FINDINGS VASCULAR Aorta: Extensive atherosclerosis of the abdominal aorta with abundant atheromatous plaque, including several ulcerative plaques. No frank aortic aneurysm or dissection noted. No definite penetrating ulcer. Mild narrowing of the infrarenal abdominal aorta (minimal measurements of 10 x 6 mm shortly above the bifurcation). Celiac: Patent without evidence of aneurysm,  dissection, vasculitis or significant stenosis. SMA: Patent without evidence of aneurysm, dissection, vasculitis or significant stenosis. Renals: Both renal arteries are patent without evidence of aneurysm, dissection, vasculitis, fibromuscular dysplasia or significant stenosis. IMA: Patent without evidence of aneurysm, dissection or vasculitis. Moderate stenosis at the ostium. Inflow: Extensive atherosclerosis. Patent without evidence of aneurysm, dissection, vasculitis or significant stenosis. Veins: No obvious venous abnormality within the limitations of this arterial phase study. Review of the MIP images confirms the above findings. NON-VASCULAR Hepatobiliary: No suspicious cystic or solid hepatic lesions. No intra or extrahepatic biliary ductal dilatation. Gallbladder is normal in appearance. Pancreas: No pancreatic mass. No pancreatic ductal dilatation. No pancreatic or peripancreatic fluid collections or inflammatory changes. Spleen: Unremarkable. Adrenals/Urinary Tract: Low-attenuation lesions in the kidneys bilaterally, compatible with simple cysts, largest of which is in the interpolar region of the left kidney measuring 4.4 cm in diameter. No aggressive appearing renal lesions. Bilateral adrenal glands are normal in appearance. No hydroureteronephrosis. Urinary bladder is normal in appearance. Stomach/Bowel: Normal appearance of the stomach. No pathologic dilatation of small bowel or colon. The appendix is not confidently identified and may be surgically absent. Regardless, there are no inflammatory changes noted adjacent to the cecum to suggest the presence of an acute appendicitis at this time. Lymphatic: No lymphadenopathy noted in the abdomen or pelvis. Reproductive: Prostate gland and seminal vesicles are unremarkable in appearance. Other: No significant volume of ascites.  No pneumoperitoneum. Musculoskeletal: There are no aggressive appearing lytic or blastic lesions noted in the visualized  portions of the skeleton. Review of the MIP images confirms the above findings. IMPRESSION: 1. Severe atherosclerosis of the thoracoabdominal aorta, including multiple ulcerated plaques in the infrarenal abdominal aorta. However, there is no evidence of thoracic aortic aneurysm or dissection. 2. 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 3. Mild stenosis of the infrarenal abdominal aorta and moderate stenosis at the ostium of the inferior mesenteric artery. 4. Cardiomegaly with left ventricular dilatation. There is also evidence of interstitial pulmonary edema, along with moderate right and small left pleural effusions; imaging findings concerning for congestive heart failure. 5. Left main and 3 vessel coronary artery disease. Please note that although the presence of coronary artery calcium documents the presence of coronary artery disease, the severity of this disease and any potential stenosis cannot be assessed on this non-gated CT examination. Assessment for potential risk factor modification, dietary therapy or pharmacologic therapy may be warranted, if clinically indicated. 6. There are calcifications of the aortic valve. Echocardiographic correlation for evaluation of potential valvular dysfunction may be warranted if clinically indicated. 7. Additional incidental findings, as above. Electronically Signed   By: Vinnie Langton M.D.   On: 08/20/2020 07:27  EKG: Independently reviewed.  NSR with rate 93; LVH; nonspecific ST changes with concern for lateral ischemia   Labs on Admission: I have personally reviewed the available labs and imaging studies at the time of the admission.  Pertinent labs:   Glucose 350 BNP 1079.7 HS troponin 208, 195 Normal CBC A1c 10.7 on 6/30 COVID/flu negative   Assessment/Plan Principal Problem:   New onset of congestive heart failure (HCC) Active Problems:   Dyslipidemia   Essential hypertension    NSTEMI (non-ST elevated myocardial infarction) ([place])   DM type 2 with diabetic mixed hyperlipidemia (HCC)   Smoking    ACS -Patient with reported h/o CAD with stents x 2 presenting with substernal chest pain, concerning for ACS -Also with SOB, LE edema - concerning for acute CHF, likely ischemic -CXR with pulmonary edema  -Initial HS troponin elevated; repeat with negative delta but in this scenario it is very concerning for ACS -EKG with apparent lateral ST changes concerning for ischemia. -TIMI risk score is 7; which predicts a 14 days risk of death, recurrent MI, or urgent revascularization of 41%.  -Will plan to admit to progressive [facility] at [facility] on telemetry to further evaluate for ACS.  -Will admit since the patient has positive troponins and/or an abnormal EKG with angina necessitating acute intervention. -CTA with severe diffuse atherosclerosis appreciated -Cardiology consultation requested -Patient appears likely to need invasive evaluation (cardiac catheterization) based on concerning history, pertinent symptoms,  ischemic EKG, elevated troponin  -Heparin and NTG drips were started in the ER, will continue -NTG for symptom relief (although there is no mortality benefit) -Beta blocker ordered by cardiology -morphine given -Supplemental O2 -Will hold ASA while on heparin  New CHF, likely ischemic -CXR consistent with pulmonary edema -Elevated BNP -With elevated BNP and abnl CXR, acute decompensated CHF seems probable as diagnosis - in this case, likely ischemic -Will request echocardiogram -CHF order set utilized -Was given Lasix 40 mg x 1 in ER and will repeat with 40 mg IV BID -Continue [place] O2 for now -Normal kidney function at this time, will follow -Treatment with SGLT-2-inhibitors reduces CHF-associated hospitalizations and should be considered  HTN -Hold Hyzaar, as patient is on NTG drip and also started on BB -Will also add prn hydralazine -Has not been taking  meds, as per pharm tech  HLD -Continue Crestor -Check lipids  DM -Last A1c was 10.7, indicating poor control -Continue Lantus -Will cover with moderate-scale SSI for now -Continue Neurontin for neuropathy  Tobacco dependence -Encourage cessation.   -Patch ordered      DVT prophylaxis: Heparin drip Code Status:  Full  Family Communication: None present and unable to reach family at the time of admission Disposition Plan:  The patient is from: home  Anticipated d/c is to: home without [facility] services   Anticipated d/c date will depend on clinical response to treatment, likely post-cath and/or CABG  Patient is currently: acutely ill Consults called: Cardiology; TOC team, nutrition  Admission status: Admit - It is my clinical opinion that admission to INPATIENT is reasonable and necessary because of the expectation that this patient will require hospital care that crosses at least 2 midnights to treat this condition based on the medical complexity of the problems presented.  Given the aforementioned information, the predictability of an adverse outcome is felt to be significant.    Karmen Bongo MD [facility]   How to contact the [facility] Attending or Consulting provider [place] or covering provider during after  hours 7P -7A, for this patient?  Check the care team in [facility] and look for a) attending/consulting [facility] provider listed and b) the [facility] team listed Log into [email or web address] and use [place]'s universal password to access. If you do not have the password, please contact the hospital operator. Locate the [facility] provider you are looking for under [facility] and page to a number that you can be directly reached. If you still have difficulty reaching the provider, please page the [facility] (Director on Call) for the Hospitalists listed on amion for assistance.   08/20/2020, 12:33 PM

## 2020-08-20 NOTE — ED Provider Notes (Signed)
[facility] DEPART[facility] Provider Note   CSN: [id number] Arrival date & time: 08/20/20  0435     History Chief Complaint  Patient presents with   Chest Pain   Shortness of Breath    Colin [name] is a 70 y.o. male with past medical history significant for CAD, diabetes, hyperlipidemia, prior heart cath who presents for evaluation of chest pain.  Began yesterday.  Located left-sided chest.  Radiates into back.  Associated short of breath.  Worse with getting up and moving around.  Feels similar to when he needed a heart cath in 2011.  He is unsure if he has had a heart attack previously.  Has also had some left-sided abdominal pain.  No fever, chills, nausea, vomiting, diaphoresis, diarrhea, dysuria.  He rates his current pain a 9/10.  CP is not pleuritic in nature. Denies additional aggravating or alleviating factors. He is not followed by cardiology  History obtained from patient and past medical records. Attempted to use video interpretor however patient was unable to understand interpretor. Son in room offered to translate.  Ultimately able to use Bea Laura interpretor via telephone with success.  Last Cards visit in 2012 with [facility]   [facility]- PCP  HPI     Past Medical History:  Diagnosis Date   Arrhythmia    Carotid artery occlusion    Diabetes mellitus     Patient Active Problem List   Diagnosis Date Noted   New onset of congestive heart failure ([place]) 08/20/2020   Essential hypertension 10/07/2019   Coronary artery disease 04/26/2010   Diabetes mellitus ([place]) 04/26/2010   Hyperlipidemia 04/26/2010    Past Surgical History:  Procedure Laterality Date   CARDIAC CATHETERIZATION     01/2010 ; 04/2010       Family History  Problem Relation Age of Onset   Diabetes Neg Hx     Social History   Tobacco Use   Smoking status: Every Day    Packs/day: 0.25    Types: Cigarettes   Smokeless tobacco: Never  Vaping Use    Vaping Use: Never used  Substance Use Topics   Alcohol use: No   Drug use: No    Home Medications Prior to Admission medications   Medication Sig Start Date End Date Taking? Authorizing Provider  Accu-Chek Softclix Lancets lancets Use to check blood sugar three times daily. 09/23/19   Charlott Rakes, MD  aspirin 81 MG tablet Take 81 mg by mouth daily.    [provider]  Blood Glucose Monitoring Suppl (ACCU-CHEK GUIDE) w/Device KIT USE AS DIRECTED 08/04/20   Renato Shin, MD  gabapentin (NEURONTIN) 100 MG capsule TAKE 1 CAPSULE (100 MG TOTAL) BY MOUTH 3 (THREE) TIMES DAILY. 05/06/20 05/06/21  Kerin Perna, NP  glucose blood (ACCU-CHEK GUIDE) test strip 1 each by Other route 2 (two) times daily. And lancets 2/day 08/04/20   Renato Shin, MD  insulin glargine (LANTUS) 100 UNIT/ML Solostar Pen Inject 60 Units into the skin every morning. 05/30/20 05/30/21  Renato Shin, MD  Insulin Pen Needle (TRUEPLUS PEN NEEDLES) 32G X 4 MM MISC Use as instructed to inject Lantus and Victoza once daily. 05/06/20   Meccariello, Bernita Raisin, DO  losartan-hydrochlorothiazide (HYZAAR) 100-25 MG tablet TAKE 1 TABLET BY MOUTH DAILY. 05/06/20 05/06/21  Meccariello, Bernita Raisin, DO  rosuvastatin (CRESTOR) 20 MG tablet TAKE 1 TABLET (20 MG TOTAL) BY MOUTH DAILY. 05/30/20 05/30/21  Kerin Perna, NP    Allergies  Patient has no known allergies.  Review of Systems   Review of Systems  Constitutional: Negative.   HENT: Negative.    Respiratory: Negative.    Cardiovascular:  Positive for chest pain. Negative for palpitations and leg swelling.  Gastrointestinal:  Positive for abdominal pain. Negative for abdominal distention, constipation, diarrhea, nausea, rectal pain and vomiting.  Genitourinary: Negative.   Musculoskeletal: Negative.   Skin: Negative.   Neurological: Negative.   All other systems reviewed and are negative.  Physical Exam Updated Vital Signs BP 137/84   Pulse 88   Temp 98.2 F (36.8  C)   Resp (!) 25   Ht 5' 4"  (1.626 m)   Wt 65 kg   SpO2 92%   BMI 24.60 kg/m   Physical Exam Vitals and nursing note reviewed.  Constitutional:      General: He is not in acute distress.    Appearance: He is well-developed. He is not ill-appearing, toxic-appearing or diaphoretic.  HENT:     Head: Atraumatic.  Eyes:     Pupils: Pupils are equal, round, and reactive to light.  Cardiovascular:     Rate and Rhythm: Normal rate and regular rhythm.     Heart sounds: Normal heart sounds.  Pulmonary:     Effort: Pulmonary effort is normal. No respiratory distress.     Breath sounds: Normal breath sounds.     Comments: Mild wheeze Chest:     Comments: Equal rise and fall to chest wall Abdominal:     General: There is no distension.     Palpations: Abdomen is soft.  Musculoskeletal:        General: Normal range of motion.     Cervical back: Normal range of motion and neck supple.     Right lower leg: No tenderness. No edema.     Left lower leg: No tenderness. No edema.     Comments: Moves all 4 extremities without difficulty.  No bony tenderness.  Compartments soft  Skin:    General: Skin is warm and dry.     Capillary Refill: Capillary refill takes less than 2 seconds.     Comments: No edema, erythema, warmth  Neurological:     General: No focal deficit present.     Mental Status: He is alert and oriented to person, place, and time.    ED Results / Procedures / Treatments   Labs (all labs ordered are listed, but only abnormal results are displayed) Labs Reviewed  BASIC METABOLIC PANEL - Abnormal; Notable for the following components:      Result Value   Glucose, Bld 350 (*)    All other components within normal limits  BRAIN NATRIURETIC PEPTIDE - Abnormal; Notable for the following components:   B Natriuretic Peptide 1,079.7 (*)    All other components within normal limits  CBG MONITORING, ED - Abnormal; Notable for the following components:   Glucose-Capillary 275 (*)     All other components within normal limits  TROPONIN I (HIGH SENSITIVITY) - Abnormal; Notable for the following components:   Troponin I (High Sensitivity) 208 (*)    All other components within normal limits  TROPONIN I (HIGH SENSITIVITY) - Abnormal; Notable for the following components:   Troponin I (High Sensitivity) 195 (*)    All other components within normal limits  RESP PANEL BY RT-PCR (FLU A&B, COVID) ARPGX2  CBC  PROTIME-INR  HEPARIN LEVEL (UNFRACTIONATED)    EKG EKG Interpretation  Date/Time:  Saturday August 20 2020 04:53:10  EDT Ventricular Rate:  95 PR Interval:  178 QRS Duration: 114 QT Interval:  350 QTC Calculation: 439 R Axis:   -12 Text Interpretation: Normal sinus rhythm Left ventricular hypertrophy with repolarization abnormality ( Cornell product ) Cannot rule out Anteroseptal infarct , age undetermined Abnormal ECG Confirmed by Aletta Edouard [phone]) on 08/20/2020 7:07:56 AM  Radiology DG Chest 2 View  Result Date: 08/20/2020 CLINICAL DATA:  70 year old male with chest pain on the left. Shortness of breath since yesterday. Denies cough or fever. EXAM: CHEST - 2 VIEW COMPARISON:  Chest radiographs 05/06/2020 and earlier. FINDINGS: Lung volumes and mediastinal contours are within normal limits. On the lateral view there is trace pleural fluid in the fissures, and there is acute diffuse increased interstitial opacity in both lungs. Kerley B-lines identified in both lung periphery. Normal cardiac size and mediastinal contours. Visualized tracheal air column is within normal limits. No pneumothorax. No acute osseous abnormality identified. Paucity of bowel gas in the upper abdomen. IMPRESSION: Acute pulmonary interstitial edema with trace pleural fluid. Electronically Signed   By: Genevie Ann M.D.   On: 08/20/2020 05:28   CT Angio Chest/Abd/Pel for Dissection W and/or W/WO  Result Date: 08/20/2020 CLINICAL DATA:  70 year old male with history of chest pain and back pain  with shortness of breath since yesterday. EXAM: CT ANGIOGRAPHY CHEST, ABDOMEN AND PELVIS TECHNIQUE: Non-contrast CT of the chest was initially obtained. Multidetector CT imaging through the chest, abdomen and pelvis was performed using the standard protocol during bolus administration of intravenous contrast. Multiplanar reconstructed images and MIPs were obtained and reviewed to evaluate the vascular anatomy. CONTRAST:  123m OMNIPAQUE IOHEXOL 350 MG/ML SOLN COMPARISON:  No prior chest CT. CT the abdomen and pelvis 12/08/2010. FINDINGS: CTA CHEST FINDINGS Cardiovascular: Precontrast images demonstrate no crescentic high attenuation associated with the wall of the thoracic aorta to suggest acute intramural hemorrhage. There is extensive aortic atherosclerosis, without definite evidence of aneurysm or dissection of the thoracic aorta on today's non cardiac gated examination. Ascending aorta, mid aortic arch and descending aorta measure 3.2 cm, 2.5 cm and 2.6 cm in diameter respectively. Atherosclerotic calcifications are also noted in the left main, left anterior descending, left circumflex and right coronary arteries. Calcifications of the aortic valve. 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 Heart size is enlarged with left ventricular dilatation. There is no significant pericardial fluid, thickening or pericardial calcification. Mediastinum/Nodes: No pathologically enlarged mediastinal or hilar lymph nodes. Esophagus is unremarkable in appearance. No axillary lymphadenopathy. Lungs/Pleura: Moderate right and small left pleural effusions lying dependently. Patchy areas of mild ground-glass attenuation and diffuse interlobular septal thickening in the lungs, most compatible with a background of interstitial pulmonary edema. No confluent consolidative airspace disease. No pleural effusions. No definite suspicious appearing pulmonary nodules or masses are noted.  Musculoskeletal: There are no aggressive appearing lytic or blastic lesions noted in the visualized portions of the skeleton. Review of the MIP images confirms the above findings. CTA ABDOMEN AND PELVIS FINDINGS VASCULAR Aorta: Extensive atherosclerosis of the abdominal aorta with abundant atheromatous plaque, including several ulcerative plaques. No frank aortic aneurysm or dissection noted. No definite penetrating ulcer. Mild narrowing of the infrarenal abdominal aorta (minimal measurements of 10 x 6 mm shortly above the bifurcation). Celiac: Patent without evidence of aneurysm, dissection, vasculitis or significant stenosis. SMA: Patent without evidence of aneurysm, dissection, vasculitis or significant stenosis. Renals: Both renal arteries are patent without evidence of aneurysm, dissection, vasculitis, fibromuscular dysplasia or significant  stenosis. IMA: Patent without evidence of aneurysm, dissection or vasculitis. Moderate stenosis at the ostium. Inflow: Extensive atherosclerosis. Patent without evidence of aneurysm, dissection, vasculitis or significant stenosis. Veins: No obvious venous abnormality within the limitations of this arterial phase study. Review of the MIP images confirms the above findings. NON-VASCULAR Hepatobiliary: No suspicious cystic or solid hepatic lesions. No intra or extrahepatic biliary ductal dilatation. Gallbladder is normal in appearance. Pancreas: No pancreatic mass. No pancreatic ductal dilatation. No pancreatic or peripancreatic fluid collections or inflammatory changes. Spleen: Unremarkable. Adrenals/Urinary Tract: Low-attenuation lesions in the kidneys bilaterally, compatible with simple cysts, largest of which is in the interpolar region of the left kidney measuring 4.4 cm in diameter. No aggressive appearing renal lesions. Bilateral adrenal glands are normal in appearance. No hydroureteronephrosis. Urinary bladder is normal in appearance. Stomach/Bowel: Normal appearance of  the stomach. No pathologic dilatation of small bowel or colon. The appendix is not confidently identified and may be surgically absent. Regardless, there are no inflammatory changes noted adjacent to the cecum to suggest the presence of an acute appendicitis at this time. Lymphatic: No lymphadenopathy noted in the abdomen or pelvis. Reproductive: Prostate gland and seminal vesicles are unremarkable in appearance. Other: No significant volume of ascites.  No pneumoperitoneum. Musculoskeletal: There are no aggressive appearing lytic or blastic lesions noted in the visualized portions of the skeleton. Review of the MIP images confirms the above findings. IMPRESSION: 1. Severe atherosclerosis of the thoracoabdominal aorta, including multiple ulcerated plaques in the infrarenal abdominal aorta. However, there is no evidence of thoracic aortic aneurysm or dissection. 2. 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 3. Mild stenosis of the infrarenal abdominal aorta and moderate stenosis at the ostium of the inferior mesenteric artery. 4. Cardiomegaly with left ventricular dilatation. There is also evidence of interstitial pulmonary edema, along with moderate right and small left pleural effusions; imaging findings concerning for congestive heart failure. 5. Left main and 3 vessel coronary artery disease. Please note that although the presence of coronary artery calcium documents the presence of coronary artery disease, the severity of this disease and any potential stenosis cannot be assessed on this non-gated CT examination. Assessment for potential risk factor modification, dietary therapy or pharmacologic therapy may be warranted, if clinically indicated. 6. There are calcifications of the aortic valve. Echocardiographic correlation for evaluation of potential valvular dysfunction may be warranted if clinically indicated. 7. Additional incidental findings, as  above. Electronically Signed   By: Vinnie Langton M.D.   On: 08/20/2020 07:27    Procedures .Critical Care  Date/Time: 08/20/2020 9:59 AM Performed by: Nettie Elm, PA-C Authorized by: Nettie Elm, PA-C   Critical care provider statement:    Critical care time (minutes):  45   Critical care was necessary to treat or prevent imminent or life-threatening deterioration of the following conditions:  Cardiac failure   Critical care was time spent personally by me on the following activities:  Discussions with consultants, evaluation of patient's response to treatment, examination of patient, ordering and performing treatments and interventions, ordering and review of laboratory studies, ordering and review of radiographic studies, pulse oximetry, re-evaluation of patient's condition, obtaining history from patient or surrogate and review of old charts   Medications Ordered in ED Medications  nitroGLYCERIN 50 mg in dextrose 5 % 250 mL (0.2 mg/mL) infusion (5 mcg/min Intravenous New Bag/Given 08/20/20 0758)  heparin ADULT infusion 100 units/mL (25000 units/240m) (900 Units/hr Intravenous New Bag/Given  08/20/20 0803)  furosemide (LASIX) injection 40 mg (has no administration in time range)  albuterol (VENTOLIN HFA) 108 (90 Base) MCG/ACT inhaler 2 puff (has no administration in time range)  ondansetron (ZOFRAN) injection 4 mg (4 mg Intravenous Given 08/20/20 0707)  morphine 4 MG/ML injection 4 mg (4 mg Intravenous Given 08/20/20 0707)  iohexol (OMNIPAQUE) 350 MG/ML injection 100 mL (100 mLs Intravenous Contrast Given 08/20/20 0659)  insulin aspart (novoLOG) injection 5 Units (5 Units Subcutaneous Given 08/20/20 0806)  heparin bolus via infusion 4,000 Units (4,000 Units Intravenous Bolus from Bag 08/20/20 0804)   ED Course  I have reviewed the triage vital signs and the nursing notes.  Pertinent labs & imaging results that were available during my care of the patient were reviewed by me  and considered in my medical decision making (see chart for details).  Here for evaluation of chest pain which began yesterday.  Also has some back pain, left abdominal pain.  He appears uncomfortable in room.  He is neurovascularly intact.  Heart and lungs clear.  Abdomen soft, nontender.  Work-up started from triage today personally reviewed and interpreted:  Troponin 208>>195 INR 1.0 CBC without leukocytosis mild metabolic panel glucose 798 Chest x-ray with some interstitial edema>>> NO prior echo, no hx of CHF EKG without ischemia CTA C/A/P without dissection however does have multivessel disease, atherosclerosis, cardiomegaly with some effusion concerning for CHF.  We will plan on consult cardiology.  Patient does have history concerning for ACS however troponins could also be elevated due to new onset heart failure.  Will heparinize patient, started on nitro drip, Lasix.  Patient does have wheeze however this is likely cardiac wheeze due to new onset heart failure.  CONSULT with Dr. Terri Skains with Cardiology who recommends Medicine admit. He will come see patient.   CONSULT with Dr. Lorin Mercy who will evaluate patient for admission.  Patient started on IV heparin, nitro, Lasix.  Clinical Course as of 08/20/20 0959  Sat Aug 20, 6945  [id number] 70 year old male with known coronary disease here with some chest pain and shortness of breath.  Troponins elevated and BNP elevated.  Will need admission for further cardiac work-up. [MB]    Clinical Course User Index [MB] Hayden Rasmussen, MD   Patient seen and evaluated by attending, Dr. Melina Copa who agrees with above treatment, plan disposition.   MDM Rules/Calculators/A&P                           Final Clinical Impression(s) / ED Diagnoses Final diagnoses:  NSTEMI (non-ST elevated myocardial infarction) ([place])  New onset of congestive heart failure ([place])  Acute pulmonary edema ([place])  Hyperglycemia    Rx / DC Orders ED Discharge Orders      None        Seneca Gadbois A, PA-C 08/20/20 0959    Hayden Rasmussen, MD 08/20/20 1756

## 2020-08-20 NOTE — ED Notes (Signed)
Patient CBG was 275.

## 2020-08-20 NOTE — Progress Notes (Signed)
  Echocardiogram 2D Echocardiogram has been performed.  [name] 08/20/2020, 11:58 AM

## 2020-08-20 NOTE — Consult Note (Signed)
CARDIOLOGY CONSULT NOTE  Patient ID: MERL BOMMARITO MRN: [id number] DOB/AGE: November 22, 1950 70 y.o.  Admit date: 08/20/2020 Attending physician: Karmen Bongo, MD Primary Physician:  Kerin Perna, NP Outpatient Cardiologist: NA Former Cardiologist: Dr. Mertie Moores Inpatient Cardiologist: Rex Kras, DO, [facility]  Reason of consultation: Chest Pain and Shortness of breath Referring physician: Britni Henderly PA-C  Chief complaint: Chest Pain and Shortness of breath  HPI:  Colin [name] is a 70 y.o. Asian descent male who presents with a chief complaint of "chest pain and shortness of breath." His past medical history and cardiovascular risk factors include: History of non-STEMI, established CAD w/ prior PCI to LCx and RCA, coronary artery calcification on non-gated CT study, IDDM Type 2, Hyperlipidemia, Smoking, Hypertension, Severe atherosclerosis of the thoracoabdominal aorta (CTA 08/20/2020).  History of present illness was difficult to obtain as he needs a medical interpreter ER providers attempted video interpreter however patient was unable to understand.  There is no family present at bedside at the time of the evaluation.  I called the patient's son personally and it went to voicemail.  Patient also tried calling his family but nobody answered.  Patient was able to provide a fragmented history as noted below.  Patient states that he was in the normal state of health yesterday night.  While he was resting had sudden onset of chest pain and shortness of breath.  The shortness of breath was the index event which was getting progressively worse with effort related activities.  He started experiencing substernal chest pain approximately 8-9 PM, intensity 8 out of 10, worse with effort related activities improving with rest.  The pain was nonradiating, nonpositional, not reproducible with palpation.  Currently patient is on IV heparin and nitro drip and the chest discomfort has improved to 4  out of 10 and he was resting comfortably prior to his examination.  Patient informs me that he has a history of heart attack approximately 10 years ago and has had 2 stents placed.  However for reasons unknown and does not follow-up with cardiology.  ALLERGIES: No Known Allergies  PAST MEDICAL HISTORY: Past Medical History:  Diagnosis Date   Arrhythmia    Carotid artery occlusion    Diabetes mellitus     PAST SURGICAL HISTORY: Past Surgical History:  Procedure Laterality Date   CARDIAC CATHETERIZATION     01/2010 ; 04/2010    FAMILY HISTORY: No family history of premature CAD or sudden cardiac death, per patient   SOCIAL HISTORY:  The patient  reports that he has been smoking cigarettes. He has been smoking an average of .25 packs per day. He has never used smokeless tobacco. He reports that he does not drink alcohol and does not use drugs.  MEDICATIONS: Current Outpatient Medications  Medication Instructions   Accu-Chek Softclix Lancets lancets Use to check blood sugar three times daily.   aspirin 81 mg, Oral, Daily,     Blood Glucose Monitoring Suppl (ACCU-CHEK GUIDE) w/Device KIT USE AS DIRECTED   gabapentin (NEURONTIN) 100 MG capsule TAKE 1 CAPSULE (100 MG TOTAL) BY MOUTH 3 (THREE) TIMES DAILY.   glucose blood (ACCU-CHEK GUIDE) test strip 1 each, Other, 2 times daily, And lancets 2/day   Insulin Pen Needle (TRUEPLUS PEN NEEDLES) 32G X 4 MM MISC Use as instructed to inject Lantus and Victoza once daily.   Lantus SoloStar 60 Units, Subcutaneous, BH-each morning   losartan-hydrochlorothiazide (HYZAAR) 100-25 MG tablet TAKE 1 TABLET BY MOUTH DAILY.   rosuvastatin (CRESTOR)  20 MG tablet TAKE 1 TABLET (20 MG TOTAL) BY MOUTH DAILY.    REVIEW OF SYSTEMS: Review of Systems  Constitutional: Positive for malaise/fatigue. Negative for chills and fever.  HENT:  Negative for hoarse voice and nosebleeds.   Eyes:  Negative for discharge, double vision and pain.  Cardiovascular:   Positive for chest pain and dyspnea on exertion. Negative for claudication, leg swelling, near-syncope, orthopnea, palpitations, paroxysmal nocturnal dyspnea and syncope.  Respiratory:  Positive for shortness of breath. Negative for hemoptysis.   Musculoskeletal:  Negative for muscle cramps and myalgias.  Gastrointestinal:  Negative for abdominal pain, constipation, diarrhea, hematemesis, hematochezia, melena, nausea and vomiting.  Neurological:  Negative for dizziness and light-headedness.  All other systems reviewed and are negative.  PHYSICAL EXAM: Vitals with BMI 08/20/2020 08/20/2020 08/20/2020  Height - - -  Weight - - -  BMI - - -  Systolic [phone]  Diastolic 84 79 86  Pulse 88 92 96    No intake or output data in the 24 hours ending 08/20/20 1102  Net IO Since Admission: No IO data has been entered for this period [08/20/20 1102]  CONSTITUTIONAL: Age-appropriate Asian descent male, resting in bed comfortably, hemodynamically stable, no acute distress.   SKIN: Skin is warm and dry. No rash noted. No cyanosis. No pallor. No jaundice HEAD: Normocephalic and atraumatic.  EYES: No scleral icterus MOUTH/THROAT: Moist oral membranes.  NECK: No JVD present. No thyromegaly noted. No carotid bruits  LYMPHATIC: No visible cervical adenopathy.  CHEST Normal respiratory effort. No intercostal retractions  LUNGS: Clear to auscultation in the upper lung fields, Rales noted at the bases bilaterally, no expiratory wheezes.  CARDIOVASCULAR: Positive S1-S2, distant heart sounds, no murmurs rubs or gallops appreciated ABDOMINAL: Nonobese, soft, nontender, nondistended, positive bowel sounds in all 4 quadrants, no apparent ascites.  EXTREMITIES: +1 bilateral peripheral edema, warm to touch, +2 dorsalis and posterior tibial pulses. HEMATOLOGIC: No significant bruising NEUROLOGIC: Oriented to person, place, and time. Nonfocal. Normal muscle tone.  PSYCHIATRIC: Normal mood and affect. Normal  behavior. Cooperative  RADIOLOGY: DG Chest 2 View  Result Date: 08/20/2020 CLINICAL DATA:  70 year old male with chest pain on the left. Shortness of breath since yesterday. Denies cough or fever. EXAM: CHEST - 2 VIEW COMPARISON:  Chest radiographs 05/06/2020 and earlier. FINDINGS: Lung volumes and mediastinal contours are within normal limits. On the lateral view there is trace pleural fluid in the fissures, and there is acute diffuse increased interstitial opacity in both lungs. Kerley B-lines identified in both lung periphery. Normal cardiac size and mediastinal contours. Visualized tracheal air column is within normal limits. No pneumothorax. No acute osseous abnormality identified. Paucity of bowel gas in the upper abdomen. IMPRESSION: Acute pulmonary interstitial edema with trace pleural fluid. Electronically Signed   By: Genevie Ann M.D.   On: 08/20/2020 05:28   CT Angio Chest/Abd/Pel for Dissection W and/or W/WO  Result Date: 08/20/2020 CLINICAL DATA:  70 year old male with history of chest pain and back pain with shortness of breath since yesterday. EXAM: CT ANGIOGRAPHY CHEST, ABDOMEN AND PELVIS TECHNIQUE: Non-contrast CT of the chest was initially obtained. Multidetector CT imaging through the chest, abdomen and pelvis was performed using the standard protocol during bolus administration of intravenous contrast. Multiplanar reconstructed images and MIPs were obtained and reviewed to evaluate the vascular anatomy. CONTRAST:  124m OMNIPAQUE IOHEXOL 350 MG/ML SOLN COMPARISON:  No prior chest CT. CT the abdomen and pelvis 12/08/2010. FINDINGS: CTA CHEST FINDINGS Cardiovascular: Precontrast images  demonstrate no crescentic high attenuation associated with the wall of the thoracic aorta to suggest acute intramural hemorrhage. There is extensive aortic atherosclerosis, without definite evidence of aneurysm or dissection of the thoracic aorta on today's non cardiac gated examination. Ascending aorta, mid  aortic arch and descending aorta measure 3.2 cm, 2.5 cm and 2.6 cm in diameter respectively. Atherosclerotic calcifications are also noted in the left main, left anterior descending, left circumflex and right coronary arteries. Calcifications of the aortic valve. 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 Heart size is enlarged with left ventricular dilatation. There is no significant pericardial fluid, thickening or pericardial calcification. Mediastinum/Nodes: No pathologically enlarged mediastinal or hilar lymph nodes. Esophagus is unremarkable in appearance. No axillary lymphadenopathy. Lungs/Pleura: Moderate right and small left pleural effusions lying dependently. Patchy areas of mild ground-glass attenuation and diffuse interlobular septal thickening in the lungs, most compatible with a background of interstitial pulmonary edema. No confluent consolidative airspace disease. No pleural effusions. No definite suspicious appearing pulmonary nodules or masses are noted. Musculoskeletal: There are no aggressive appearing lytic or blastic lesions noted in the visualized portions of the skeleton. Review of the MIP images confirms the above findings. CTA ABDOMEN AND PELVIS FINDINGS VASCULAR Aorta: Extensive atherosclerosis of the abdominal aorta with abundant atheromatous plaque, including several ulcerative plaques. No frank aortic aneurysm or dissection noted. No definite penetrating ulcer. Mild narrowing of the infrarenal abdominal aorta (minimal measurements of 10 x 6 mm shortly above the bifurcation). Celiac: Patent without evidence of aneurysm, dissection, vasculitis or significant stenosis. SMA: Patent without evidence of aneurysm, dissection, vasculitis or significant stenosis. Renals: Both renal arteries are patent without evidence of aneurysm, dissection, vasculitis, fibromuscular dysplasia or significant stenosis. IMA: Patent without evidence of aneurysm, dissection  or vasculitis. Moderate stenosis at the ostium. Inflow: Extensive atherosclerosis. Patent without evidence of aneurysm, dissection, vasculitis or significant stenosis. Veins: No obvious venous abnormality within the limitations of this arterial phase study. Review of the MIP images confirms the above findings. NON-VASCULAR Hepatobiliary: No suspicious cystic or solid hepatic lesions. No intra or extrahepatic biliary ductal dilatation. Gallbladder is normal in appearance. Pancreas: No pancreatic mass. No pancreatic ductal dilatation. No pancreatic or peripancreatic fluid collections or inflammatory changes. Spleen: Unremarkable. Adrenals/Urinary Tract: Low-attenuation lesions in the kidneys bilaterally, compatible with simple cysts, largest of which is in the interpolar region of the left kidney measuring 4.4 cm in diameter. No aggressive appearing renal lesions. Bilateral adrenal glands are normal in appearance. No hydroureteronephrosis. Urinary bladder is normal in appearance. Stomach/Bowel: Normal appearance of the stomach. No pathologic dilatation of small bowel or colon. The appendix is not confidently identified and may be surgically absent. Regardless, there are no inflammatory changes noted adjacent to the cecum to suggest the presence of an acute appendicitis at this time. Lymphatic: No lymphadenopathy noted in the abdomen or pelvis. Reproductive: Prostate gland and seminal vesicles are unremarkable in appearance. Other: No significant volume of ascites.  No pneumoperitoneum. Musculoskeletal: There are no aggressive appearing lytic or blastic lesions noted in the visualized portions of the skeleton. Review of the MIP images confirms the above findings. IMPRESSION: 1. Severe atherosclerosis of the thoracoabdominal aorta, including multiple ulcerated plaques in the infrarenal abdominal aorta. However, there is no evidence of thoracic aortic aneurysm or dissection. 2. Moderate to severe stenosis just beyond the  ostium of the patient is aberrant right subclavian artery. 3. Mild stenosis of the infrarenal abdominal aorta and moderate stenosis at the ostium of the  inferior mesenteric artery. 4. Cardiomegaly with left ventricular dilatation. There is also evidence of interstitial pulmonary edema, along with moderate right and small left pleural effusions; imaging findings concerning for congestive heart failure. 5. Left main and 3 vessel coronary artery disease. Please note that although the presence of coronary artery calcium documents the presence of coronary artery disease, the severity of this disease and any potential stenosis cannot be assessed on this non-gated CT examination. Assessment for potential risk factor modification, dietary therapy or pharmacologic therapy may be warranted, if clinically indicated. 6. There are calcifications of the aortic valve. Echocardiographic correlation for evaluation of potential valvular dysfunction may be warranted if clinically indicated. 7. Additional incidental findings, as above. Electronically Signed   By: Vinnie Langton M.D.   On: 08/20/2020 07:27    LABORATORY DATA: Lab Results  Component Value Date   WBC 10.5 08/20/2020   HGB 13.4 08/20/2020   HCT 39.3 08/20/2020   MCV 92.0 08/20/2020   PLT 227 08/20/2020    Recent Labs  Lab 08/20/20 0449  NA 138  K 3.9  CL 107  CO2 26  BUN 17  CREATININE 1.19  CALCIUM 9.6  GLUCOSE 350*    Lipid Panel  Lab Results  Component Value Date   CHOL 171 12/17/2019   HDL 41 12/17/2019   LDLCALC 114 (H) 12/17/2019   TRIG 88 12/17/2019   CHOLHDL 4.2 12/17/2019    BNP (last 3 results) Recent Labs    08/20/20 0449  BNP 1,079.7*    HEMOGLOBIN A1C Lab Results  Component Value Date   HGBA1C 10.7 (A) 08/04/2020    Cardiac Panel (last 3 results) Recent Labs    08/20/20 0449 08/20/20 0628  TROPONINIHS 208* 195*     TSH No results for input(s): TSH in the last 8760 hours.   CARDIAC  DATABASE: EKG: 05/06/2020: Normal sinus rhythm, 65 bpm, left axis deviation, old anteroseptal infarct, without underlying injury pattern.  08/20/2020: Normal sinus rhythm, 93 bpm, old anteroseptal infarct, ST-T changes in the lateral leads suggestive of ischemia, without underlying injury pattern.  Echocardiogram: None  Stress Testing:  None  Heart Catheterization: 04/21/2010 by Dr. Burt Knack:  1. Successful stenting of the right coronary artery with overlapping long drug-eluting stents. 2. Negative pressure wire analysis of the LAD.  CTA Chest / Abd/ Pelvis  08/20/2020: 1. Severe atherosclerosis of the thoracoabdominal aorta, including multiple ulcerated plaques in the infrarenal abdominal aorta. However, there is no evidence of thoracic aortic aneurysm or dissection. 2. 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 3. Mild stenosis of the infrarenal abdominal aorta and moderate stenosis at the ostium of the inferior mesenteric artery. 4. Cardiomegaly with left ventricular dilatation. There is also evidence of interstitial pulmonary edema, along with moderate right and small left pleural effusions; imaging findings concerning for congestive heart failure. 5. Left main and 3 vessel coronary artery disease. Please note that although the presence of coronary artery calcium documents the presence of coronary artery disease, the severity of this disease and any potential stenosis cannot be assessed on this non-gated CT examination. Assessment for potential risk factor modification, dietary therapy or pharmacologic therapy may be warranted, if clinically indicated. 6. There are calcifications of the aortic valve. Echocardiographic correlation for evaluation of potential valvular dysfunction may be warranted if clinically indicated. 7. Additional incidental findings, as above.  IMPRESSION & RECOMMENDATIONS: Colin [name] is a 70  y.o. Asian descent male whose past  medical history and cardiovascular risk factors include: History of non-STEMI, established CAD w/ prior PCI to LCx and RCA, coronary artery calcification on non-gated CT study, IDDM Type 2, Hyperlipidemia, Smoking, Hypertension, Severe atherosclerosis of the thoracoabdominal aorta (CTA 08/20/2020)..  Non-STEMI -Chest pain suggestive of angina pectoris, high sensitive troponin peak at 208, EKG changes in the lateral leads suggestive of ischemia -History of prior non-STEMI status post PCI to the LCx and RCA -No outpatient longitudinal cardiovascular follow-up -Multiple cardiovascular risk factors as outlined above Currently on IV heparin drip per ACS protocol. Continue IV nitro drip for symptom management Will start low-dose Lopressor 12.5 mg p.o. twice daily -for angina pectoris Check EKG Plan echocardiogram Check fasting lipid profile Continue aspirin 81 mg p.o. daily Increase Crestor to 40 mg p.o. nightly Did discuss the need for left heart catheterization with possible intervention.  However due to language barrier I would like to discuss it further with the next of kin.   As mentioned above tried calling his son who hung up.  Patient also tried calling his son and other family members were unreachable at this time. I have asked the nurse to page me back once the family is at bedside to discuss this further.  Shortness of breath (most likely secondary to underlying/progressive CAD and an clinically highly suggestive of new onset heart failure) -Interstitial edema noted on chest x-ray. -Mild JVP on examination, lower extremity swelling -Denies orthopnea, paroxysmal nocturnal dyspnea -BNP 1079 Continue Lasix 40 mg IV push daily Strict I's and O's and daily weights Low-salt diet recommended Plan echo as discussed above Further recommendations to follow Will uptitrate GDMT as per hemodynamics and laboratory values.  Insulin-dependent diabetes mellitus  type 2 Last hemoglobin A1c 10.7 Serum glucose not well controlled  Currently managed by primary team  Cigarette smoking: Currently smokes 0.5 packs/day Educated on the importance of complete smoking cessation given multiple cardiovascular risk factors.  Multivessel coronary artery calcification noted on not gated CT study: Continue aspirin and statin therapy and plan discussed above  Severe atherosclerosis thoracoabdominal aorta: Continue aspirin and statin therapy/plan discussed above   CRITICAL CARE Performed by: Rex Kras   Total critical care time: 55 minutes   Critical care time was exclusive of separately billable procedures and treating other patients.   Critical care was necessary to treat or prevent imminent or life-threatening deterioration.   Critical care was time spent personally by me on the following activities: development of treatment plan with patient and/or surrogate as well as nursing, discussions with consultants, evaluation of patient's response to treatment, examination of patient, obtaining history from patient or surrogate, ordering and performing treatments and interventions, ordering and review of laboratory studies, ordering and review of radiographic studies, pulse oximetry and re-evaluation of patient's condition.  This note was created using a voice recognition software as a result there may be grammatical errors inadvertently enclosed that do not reflect the nature of this encounter. Every attempt is made to correct such errors.  Mec[facility]  Pager: [phone] Office: [phone] 08/20/2020, 11:02 AM

## 2020-08-21 DIAGNOSIS — I509 Heart failure, unspecified: Secondary | ICD-10-CM

## 2020-08-21 DIAGNOSIS — I214 Non-ST elevation (NSTEMI) myocardial infarction: Secondary | ICD-10-CM | POA: Diagnosis not present

## 2020-08-21 DIAGNOSIS — E782 Mixed hyperlipidemia: Secondary | ICD-10-CM

## 2020-08-21 DIAGNOSIS — E1169 Type 2 diabetes mellitus with other specified complication: Secondary | ICD-10-CM

## 2020-08-21 DIAGNOSIS — E785 Hyperlipidemia, unspecified: Secondary | ICD-10-CM

## 2020-08-21 DIAGNOSIS — F17200 Nicotine dependence, unspecified, uncomplicated: Secondary | ICD-10-CM

## 2020-08-21 DIAGNOSIS — I10 Essential (primary) hypertension: Secondary | ICD-10-CM

## 2020-08-21 LAB — BASIC METABOLIC PANEL
Anion gap: 5 (ref 5–15)
BUN: 19 mg/dL (ref 8–23)
CO2: 32 mmol/L (ref 22–32)
Calcium: 9.5 mg/dL (ref 8.9–10.3)
Chloride: 105 mmol/L (ref 98–111)
Creatinine, Ser: 1.54 mg/dL — ABNORMAL HIGH (ref 0.61–1.24)
GFR, Estimated: 48 mL/min — ABNORMAL LOW (ref >60)
Glucose, Bld: 38 mg/dL — CL (ref 70–99)
Potassium: 3.2 mmol/L — ABNORMAL LOW (ref 3.5–5.1)
Sodium: 142 mmol/L (ref 135–145)

## 2020-08-21 LAB — LIPID PANEL
Cholesterol: 187 mg/dL (ref 0–200)
HDL: 40 mg/dL — ABNORMAL LOW (ref >40)
LDL Cholesterol: 136 mg/dL — ABNORMAL HIGH (ref 0–99)
Total CHOL/HDL Ratio: 4.7 RATIO
Triglycerides: 53 mg/dL (ref <150)
VLDL: 11 mg/dL (ref 0–40)

## 2020-08-21 LAB — CBC
HCT: 39 % (ref 39.0–52.0)
Hemoglobin: 13 g/dL (ref 13.0–17.0)
MCH: 31 pg (ref 26.0–34.0)
MCHC: 33.3 g/dL (ref 30.0–36.0)
MCV: 92.9 fL (ref 80.0–100.0)
Platelets: 238 10*3/uL (ref 150–400)
RBC: 4.2 MIL/uL — ABNORMAL LOW (ref 4.22–5.81)
RDW: 13.1 % (ref 11.5–15.5)
WBC: 8.5 10*3/uL (ref 4.0–10.5)
nRBC: 0 % (ref 0.0–0.2)

## 2020-08-21 LAB — GLUCOSE, CAPILLARY
Glucose-Capillary: 72 mg/dL (ref 70–99)
Glucose-Capillary: 75 mg/dL (ref 70–99)
Glucose-Capillary: 299 mg/dL — ABNORMAL HIGH (ref 70–99)
Glucose-Capillary: 208 mg/dL — ABNORMAL HIGH (ref 70–99)
Glucose-Capillary: 245 mg/dL — ABNORMAL HIGH (ref 70–99)

## 2020-08-21 LAB — HEPARIN LEVEL (UNFRACTIONATED)
Heparin Unfractionated: 0.1 IU/mL — ABNORMAL LOW (ref 0.30–0.70)
Heparin Unfractionated: 0.18 IU/mL — ABNORMAL LOW (ref 0.30–0.70)
Heparin Unfractionated: 0.33 IU/mL (ref 0.30–0.70)

## 2020-08-21 LAB — LDL CHOLESTEROL, DIRECT: Direct LDL: 137.1 mg/dL — ABNORMAL HIGH (ref 0–99)

## 2020-08-21 MED ORDER — FUROSEMIDE 10 MG/ML IJ SOLN: 40.0000 mg | Freq: Every day | INTRAMUSCULAR | Status: DC

## 2020-08-21 MED ORDER — POTASSIUM CHLORIDE CRYS ER 20 MEQ PO TBCR
40.0000 meq | EXTENDED_RELEASE_TABLET | Freq: Once | ORAL | Status: AC
  Administered 2020-08-21: 40 meq via ORAL
  Filled 2020-08-21: qty 2

## 2020-08-21 MED ORDER — SODIUM CHLORIDE 0.9% FLUSH
3.0000 mL | Freq: Two times a day (BID) | INTRAVENOUS | Status: DC
  Administered 2020-08-22: 3 mL via INTRAVENOUS

## 2020-08-21 NOTE — Progress Notes (Addendum)
PROGRESS NOTE    Colin [name]  AOZ:[id number] DOB: [birth year] DOA: 08/20/2020 PCP: Grayce Sessions, NP    Brief Narrative:  Colin [name] was admitted to the hospital with the working diagnosis of Non STEMI, complicated with acute systolic heart failure and AKI.   70 year old male past medical history for hypertension, dyslipidemia, type 2 diabetes mellitus who presented with chest pain and dyspnea.  Reported 24 hours of weakness, dyspnea and lower extremity edema.  Worse symptoms with exertion.  Limited history due to language barriers.  On his initial physical examination blood pressure 102/50, heart rate 82, respiratory rate 18-21, oxygen saturation 94%, his lungs are clear to auscultation bilaterally, heart S1-S2, present, rhythmic, abdomen soft, positive lower extremity edema.  Sodium 138, potassium 3.9, chloride 107, bicarb 26, glucose 350, BUN 17, creatinine 1.19, BNP 1079, high sensitive troponin 208, 197, 801, 970, white count 10.5, hemoglobin 13.4, hematocrit 39.3, platelets 227. SARS COVID-19 negative.  Chest radiograph, personally reviewed, hyperinflation, bilateral reticular infiltrates diffusely, no frank pleural effusions, fluid in the right fissure.  Cervical hyperextension.  EKG 93 bpm, normal axis, normal intervals, sinus rhythm, Q-wave V1-V3, ST depressions, T wave inversions V5 V6, LVH.  Patient has been placed on IV heparin and furosemide for diuresis.  Plan for cardiac cath in am for coronary angiography.   Assessment & Plan:   Principal Problem:   New onset of congestive heart failure (HCC) Active Problems:   Dyslipidemia   Essential hypertension   NSTEMI (non-ST elevated myocardial infarction) (HCC)   DM type 2 with diabetic mixed hyperlipidemia (HCC)   Smoking   NSTMI. Patient with no further chest pain or dyspnea. Last troponin is 970.   Continue medical therapy with IV heparin and nitroglycerin drip.  Continue with rosuvastatin Not on B blockade due  to risk of hypotension  Holding ACE inh due to risk of worsening renal function.  Continue with aspirin   Plan for cardiac catheterization in am.   2. AKI, hypokalemia. Worsening renal function with serum cr at 1,54 from 1,19, K is 3,2 and serum bicarbonate is 32.  Plan to decrease dose of furosemide and follow up renal function in am, avoid hypotension and nephrotoxic medications.  Continue K correction with KC Check renal function in am including Mg.   3. Acute systolic heart failure. Echocardiogram with LV EF 30 to 35% with moderate to severe decreased systolic function, global hypokinesis, mild reduction in RV systolic function, moderate mitral valve regurgitation.   Improved volume status, will plan to decrease dose of furosemide.  Continue with dapagliflozin   4. HTN. Continue to hold on antihypertensive therapy for now. Continue with nitroglycerin drip for acs.   5. T2Dm/ hypoglycemia. Continue glucose cover and monitoring with insulin sliding scale.  Fasting glucose this am 38, will hold on basal insulin for now.   6. Tobacco abuse. Nicotine patch, smoking cessation   Patient continue to be at high risk for worsening ACS  Status is: Inpatient  Remains inpatient appropriate because:IV treatments appropriate due to intensity of illness or inability to take PO  Dispo: The patient is from: Home              Anticipated d/c is to: Home              Patient currently is not medically stable to d/c.   Difficult to place patient No   DVT prophylaxis: Heparin drip   Code Status:   full  Family Communication:  No family  at the bedside      Consultants:  Cardiology     Subjective: Patient with no further chest pain or dyspnea, no nausea or vomiting, limited history due to language barriers.   Objective: Vitals:   08/21/20 0000 08/21/20 0100 08/21/20 0413 08/21/20 0454  BP: (!) 84/55 103/63 111/65   Pulse:      Resp: 15 20 15    Temp: 98.3 F (36.8 C)  98.5 F  (36.9 C)   TempSrc: Oral  Oral   SpO2: 98%  97%   Weight:    62.8 kg  Height:        Intake/Output Summary (Last 24 hours) at 08/21/2020 1329 Last data filed at 08/21/2020 1100 Gross per 24 hour  Intake --  Output 3050 ml  Net -3050 ml   Filed Weights   08/20/20 0446 08/21/20 0454  Weight: 65 kg 62.8 kg    Examination:   General: Not in pain or dyspnea, deconditioned  Neurology: Awake and alert, non focal  E ENT: mild pallor, no icterus, oral mucosa moist Cardiovascular: No JVD. S1-S2 present, rhythmic, no gallops, rubs, or murmurs. No lower extremity edema. Pulmonary: positive breath sounds bilaterally, adequate air movement, no wheezing, rhonchi or rales. Gastrointestinal. Abdomen soft and non tender Skin. No rashes Musculoskeletal: no joint deformities     Data Reviewed: I have personally reviewed following labs and imaging studies  CBC: Recent Labs  Lab 08/20/20 0449 08/21/20 0039  WBC 10.5 8.5  HGB 13.4 13.0  HCT 39.3 39.0  MCV 92.0 92.9  PLT 227 238   Basic Metabolic Panel: Recent Labs  Lab 08/20/20 0449 08/21/20 0039  NA 138 142  K 3.9 3.2*  CL 107 105  CO2 26 32  GLUCOSE 350* 38*  BUN 17 19  CREATININE 1.19 1.54*  CALCIUM 9.6 9.5   GFR: Estimated Creatinine Clearance: 37.4 mL/min (A) (by C-G formula based on SCr of 1.54 mg/dL (H)). Liver Function Tests: No results for input(s): AST, ALT, ALKPHOS, BILITOT, PROT, ALBUMIN in the last 168 hours. No results for input(s): LIPASE, AMYLASE in the last 168 hours. No results for input(s): AMMONIA in the last 168 hours. Coagulation Profile: Recent Labs  Lab 08/20/20 0449  INR 1.0   Cardiac Enzymes: No results for input(s): CKTOTAL, CKMB, CKMBINDEX, TROPONINI in the last 168 hours. BNP (last 3 results) No results for input(s): PROBNP in the last 8760 hours. HbA1C: No results for input(s): HGBA1C in the last 72 hours. CBG: Recent Labs  Lab 08/20/20 1724 08/20/20 2029 08/21/20 0351  08/21/20 0603 08/21/20 1220  GLUCAP 137* 253* 72 75 299*   Lipid Profile: Recent Labs    08/21/20 0039  CHOL 187  HDL 40*  LDLCALC 136*  TRIG 53  CHOLHDL 4.7  LDLDIRECT 137.1*   Thyroid Function Tests: No results for input(s): TSH, T4TOTAL, FREET4, T3FREE, THYROIDAB in the last 72 hours. Anemia Panel: No results for input(s): VITAMINB12, FOLATE, FERRITIN, TIBC, IRON, RETICCTPCT in the last 72 hours.    Radiology Studies: I have reviewed all of the imaging during this hospital visit personally     Scheduled Meds:  aspirin EC  81 mg Oral Daily   dapagliflozin propanediol  10 mg Oral Daily   docusate sodium  100 mg Oral BID   [START ON 08/22/2020] furosemide  40 mg Intravenous Daily   gabapentin  100 mg Oral TID   insulin aspart  0-15 Units Subcutaneous TID WC   insulin aspart  0-5 Units Subcutaneous QHS  insulin glargine  60 Units Subcutaneous Daily   potassium chloride  40 mEq Oral Once   rosuvastatin  40 mg Oral QHS   sodium chloride flush  3 mL Intravenous Q12H   Continuous Infusions:  heparin 1,000 Units/hr (08/21/20 1216)   nitroGLYCERIN 2.5 mcg/min (08/20/20 1117)     LOS: 1 day        Margy Sumler Annett Gula, MD

## 2020-08-21 NOTE — Progress Notes (Signed)
ANTICOAGULATION CONSULT NOTE   Pharmacy Consult for Heparin Indication: chest pain/ACS  No Known Allergies  Patient Measurements: Height: 5\' 4"  (162.6 cm) Weight: 62.8 kg (138 lb 6.4 oz) IBW/kg (Calculated) : 59.2 Heparin Dosing Weight: 65 kg  Vital Signs: Temp: 98.6 F (37 C) (07/17 2000) Temp Source: Oral (07/17 2000) BP: 140/105 (07/17 2000)  Labs: Recent Labs    08/20/20 0449 08/20/20 0628 08/20/20 1632 08/20/20 1632 08/20/20 1911 08/21/20 0039 08/21/20 1053 08/21/20 1927  HGB 13.4  --   --   --   --  13.0  --   --   HCT 39.3  --   --   --   --  39.0  --   --   PLT 227  --   --   --   --  238  --   --   LABPROT 13.5  --   --   --   --   --   --   --   INR 1.0  --   --   --   --   --   --   --   HEPARINUNFRC  --   --  0.96*   < >  --  0.10* 0.18* 0.33  CREATININE 1.19  --   --   --   --  1.54*  --   --   TROPONINIHS 208* 195* 801*  --  970*  --   --   --    < > = values in this interval not displayed.     Estimated Creatinine Clearance: 37.4 mL/min (A) (by C-G formula based on SCr of 1.54 mg/dL (H)).   Assessment: 70 y.o. M presents with SOB. Trop elevated to 195. To begin heparin for ACS. No AC PTA. CBC ok on admission.  Heparin level therapeutic at 0.33, on 1000 units/hr. No s/sx of bleeding or infusion issues.   Goal of Therapy:  Heparin level 0.3-0.7 units/ml Monitor platelets by anticoagulation protocol: Yes   Plan:  Continue heparin gtt to 1000 units/hr Will f/u heparin level in 8 hours with AM labs Monitor CBC, HL, and for s/sx of bleeding  66, PharmD, BCCCP Clinical Pharmacist  Phone: [phone] 08/21/2020 8:52 PM  Please check AMION for all [facility] phone numbers After 10:00 PM, call Main Pharmacy [phone]

## 2020-08-21 NOTE — Progress Notes (Deleted)
ANTICOAGULATION CONSULT NOTE   Pharmacy Consult for Heparin Indication: chest pain/ACS  No Known Allergies  Patient Measurements: Height: 5\' 4"  (162.6 cm) Weight: 62.8 kg (138 lb 6.4 oz) IBW/kg (Calculated) : 59.2 Heparin Dosing Weight: 65 kg  Vital Signs: Temp: 98.5 F (36.9 C) (07/17 0413) Temp Source: Oral (07/17 0413) BP: 111/65 (07/17 0413)  Labs: Recent Labs    08/20/20 0449 08/20/20 0628 08/20/20 1632 08/20/20 1911 08/21/20 0039 08/21/20 1053  HGB 13.4  --   --   --  13.0  --   HCT 39.3  --   --   --  39.0  --   PLT 227  --   --   --  238  --   LABPROT 13.5  --   --   --   --   --   INR 1.0  --   --   --   --   --   HEPARINUNFRC  --   --  0.96*  --  0.10* 0.18*  CREATININE 1.19  --   --   --  1.54*  --   TROPONINIHS 208* 195* 801* 970*  --   --      Estimated Creatinine Clearance: 37.4 mL/min (A) (by C-G formula based on SCr of 1.54 mg/dL (H)).   Assessment: 70 y.o. M presents with SOB. He is on heparin for ACS. No AC PTA. Possible cath on 7/18.  -heparin level= 0.18 on 1000 units/hr, Hg= 13.0  Goal of Therapy:  Heparin level 0.3-0.7 units/ml Monitor platelets by anticoagulation protocol: Yes   Plan:  -heparin bolus 1000 units x1 and increase infusion to 1150 units/hr -Heparin level in 8 hours and daily wth CBC daily  8/18, PharmD Clinical Pharmacist **Pharmacist phone directory can now be found on [email or web address] ([email or web address]).  Listed under [facility].

## 2020-08-21 NOTE — Plan of Care (Signed)

## 2020-08-21 NOTE — Progress Notes (Signed)
CRITICAL VALUE STICKER  CRITICAL VALUE:glucose reference 38 (lab draw)  MESSENGER (representative from lab):WAYK  Pt alert x4. Asymptomatic. Gave 2 cups OJ.  Recheck CBG 72.

## 2020-08-21 NOTE — Progress Notes (Signed)
ANTICOAGULATION CONSULT NOTE   Pharmacy Consult for Heparin Indication: chest pain/ACS  No Known Allergies  Patient Measurements: Height: 5\' 4"  (162.6 cm) Weight: 65 kg (143 lb 4.8 oz) IBW/kg (Calculated) : 59.2 Heparin Dosing Weight: 65 kg  Vital Signs: Temp: 98.3 F (36.8 C) (07/17 0000) Temp Source: Oral (07/17 0000) BP: 103/63 (07/17 0100) Pulse Rate: 74 (07/16 2027)  Labs: Recent Labs    08/20/20 0449 08/20/20 0628 08/20/20 1632 08/20/20 1911 08/21/20 0039  HGB 13.4  --   --   --  13.0  HCT 39.3  --   --   --  39.0  PLT 227  --   --   --  238  LABPROT 13.5  --   --   --   --   INR 1.0  --   --   --   --   HEPARINUNFRC  --   --  0.96*  --  0.10*  CREATININE 1.19  --   --   --   --   TROPONINIHS 208* 195* 801* 970*  --      Estimated Creatinine Clearance: 48.4 mL/min (by C-G formula based on SCr of 1.19 mg/dL).   Assessment: 70 y.o. M presents with SOB. Trop elevated to 195. To begin heparin for ACS. No AC PTA. CBC ok on admission.  Heparin level now down to subtherapeutic (0.1) on gtt at 700 units/hr. No issues with line or bleeding reported per RN.   Goal of Therapy:  Heparin level 0.3-0.7 units/ml Monitor platelets by anticoagulation protocol: Yes   Plan:  Increase heparin gtt to 850 units/hr Will f/u heparin level in 8 hours  66, PharmD, BCPS Please see amion for complete clinical pharmacist phone list 08/21/2020 2:58 AM

## 2020-08-21 NOTE — Progress Notes (Addendum)
ANTICOAGULATION CONSULT NOTE   Pharmacy Consult for Heparin Indication: chest pain/ACS  No Known Allergies  Patient Measurements: Height: 5\' 4"  (162.6 cm) Weight: 62.8 kg (138 lb 6.4 oz) IBW/kg (Calculated) : 59.2 Heparin Dosing Weight: 65 kg  Vital Signs: Temp: 98.5 F (36.9 C) (07/17 0413) Temp Source: Oral (07/17 0413) BP: 111/65 (07/17 0413)  Labs: Recent Labs    08/20/20 0449 08/20/20 0628 08/20/20 1632 08/20/20 1911 08/21/20 0039 08/21/20 1053  HGB 13.4  --   --   --  13.0  --   HCT 39.3  --   --   --  39.0  --   PLT 227  --   --   --  238  --   LABPROT 13.5  --   --   --   --   --   INR 1.0  --   --   --   --   --   HEPARINUNFRC  --   --  0.96*  --  0.10* 0.18*  CREATININE 1.19  --   --   --  1.54*  --   TROPONINIHS 208* 195* 801* 970*  --   --      Estimated Creatinine Clearance: 37.4 mL/min (A) (by C-G formula based on SCr of 1.54 mg/dL (H)).   Assessment: 70 y.o. M presents with SOB. Trop elevated to 195. To begin heparin for ACS. No AC PTA. CBC ok on admission.  Heparin level subtherapeutic at 0.18 @ 850 units/hr. No plan to bolus, increase rate by 150 units/hr for a new rate of 1000 units/hr. Recheck HL in 8h.   Scr 1.52, K 3.2, ongoing IV lasix  Goal of Therapy:  Heparin level 0.3-0.7 units/ml Monitor platelets by anticoagulation protocol: Yes   Plan:  Increase heparin gtt to 1000 units/hr Will f/u heparin level in 8 hours KCl 40 mEq x1  66, Pharm.D. PGY-1 Ambulatory Care Resident Phone:[phone] 08/21/2020 12:02 PM

## 2020-08-21 NOTE — Progress Notes (Signed)
Progress Note  Patient Name: Colin [name] Date of Encounter: 08/21/2020  Attending physician: Coralie Keens Primary care provider: Grayce Sessions, NP Primary Cardiologist:  NA Consultant:Zakari Couchman Odis Hollingshead, DO  Subjective: Colin [name] is a 70 y.o. male who was seen and examined at bedside eating lunch. Chest pain and shortness of breath has improved compared to yday.  LE swelling improved Case discussed and reviewed with his nurse.  Objective: Vital Signs in the last 24 hours: Temp:  [98.3 F (36.8 C)-98.5 F (36.9 C)] 98.5 F (36.9 C) (07/17 0413) Pulse Rate:  [65-78] 74 (07/16 2027) Resp:  [15-25] 15 (07/17 0413) BP: (84-136)/(55-91) 111/65 (07/17 0413) SpO2:  [91 %-98 %] 97 % (07/17 0413) Weight:  [62.8 kg] 62.8 kg (07/17 0454)  Intake/Output:  Intake/Output Summary (Last 24 hours) at 08/21/2020 1318 Last data filed at 08/21/2020 1100 Gross per 24 hour  Intake --  Output 3050 ml  Net -3050 ml    Net IO Since Admission: -3,875 mL [08/21/20 1318]  Weights:  Filed Weights   08/20/20 0446 08/21/20 0454  Weight: 65 kg 62.8 kg    Telemetry: Personally reviewed.  Physical examination: PHYSICAL EXAM: Vitals with BMI 08/21/2020 08/21/2020 08/21/2020  Height - - -  Weight 138 lbs 6 oz - -  BMI 23.74 - -  Systolic - 111 103  Diastolic - 65 63  Pulse - - -    CONSTITUTIONAL: Age-appropriate Asian descent male, resting in bed comfortably, hemodynamically stable, no acute distress.   SKIN: Skin is warm and dry. No rash noted. No cyanosis. No pallor. No jaundice HEAD: Normocephalic and atraumatic. EYES: No scleral icterus MOUTH/THROAT: Moist oral membranes. NECK: No JVD present. No thyromegaly noted. No carotid bruits LYMPHATIC: No visible cervical adenopathy. CHEST Normal respiratory effort. No intercostal retractions LUNGS: Clear to auscultation in the upper lung fields, Rales noted at the bases bilaterally, no expiratory wheezes.  CARDIOVASCULAR:  Positive S1-S2, distant heart sounds, no murmurs rubs or gallops appreciated ABDOMINAL: Nonobese, soft, nontender, nondistended, positive bowel sounds in all 4 quadrants, no apparent ascites. EXTREMITIES: +1 bilateral peripheral edema, warm to touch, +2 dorsalis and posterior tibial pulses. HEMATOLOGIC: No significant bruising NEUROLOGIC: Oriented to person, place, and time. Nonfocal. Normal muscle tone. PSYCHIATRIC: Normal mood and affect. Normal behavior. Cooperative  Lab Results: Hematology Recent Labs  Lab 08/20/20 0449 08/21/20 0039  WBC 10.5 8.5  RBC 4.27 4.20*  HGB 13.4 13.0  HCT 39.3 39.0  MCV 92.0 92.9  MCH 31.4 31.0  MCHC 34.1 33.3  RDW 13.1 13.1  PLT 227 238    Chemistry Recent Labs  Lab 08/20/20 0449 08/21/20 0039  NA 138 142  K 3.9 3.2*  CL 107 105  CO2 26 32  GLUCOSE 350* 38*  BUN 17 19  CREATININE 1.19 1.54*  CALCIUM 9.6 9.5  GFRNONAA >60 48*  ANIONGAP 5 5     Cardiac Enzymes: Cardiac Panel (last 3 results) Recent Labs    08/20/20 0628 08/20/20 1632 08/20/20 1911  TROPONINIHS 195* 801* 970*    BNP (last 3 results) Recent Labs    08/20/20 0449  BNP 1,079.7*    ProBNP (last 3 results) No results for input(s): PROBNP in the last 8760 hours.   DDimer No results for input(s): DDIMER in the last 168 hours.   Hemoglobin A1c:  Lab Results  Component Value Date   HGBA1C 10.7 (A) 08/04/2020    TSH No results for input(s): TSH in the last 8760 hours.  Lipid Panel  Lab Results  Component Value Date   CHOL 187 08/21/2020   HDL 40 (L) 08/21/2020   LDLCALC 136 (H) 08/21/2020   LDLDIRECT 137.1 (H) 08/21/2020   TRIG 53 08/21/2020   CHOLHDL 4.7 08/21/2020    Imaging: DG Chest 2 View  Result Date: 08/20/2020 CLINICAL DATA:  70 year old male with chest pain on the left. Shortness of breath since yesterday. Denies cough or fever. EXAM: CHEST - 2 VIEW COMPARISON:  Chest radiographs 05/06/2020 and earlier. FINDINGS: Lung volumes and  mediastinal contours are within normal limits. On the lateral view there is trace pleural fluid in the fissures, and there is acute diffuse increased interstitial opacity in both lungs. Kerley B-lines identified in both lung periphery. Normal cardiac size and mediastinal contours. Visualized tracheal air column is within normal limits. No pneumothorax. No acute osseous abnormality identified. Paucity of bowel gas in the upper abdomen. IMPRESSION: Acute pulmonary interstitial edema with trace pleural fluid. Electronically Signed   By: Odessa Fleming M.D.   On: 08/20/2020 05:28   ECHOCARDIOGRAM COMPLETE  Result Date: 08/20/2020    ECHOCARDIOGRAM REPORT   Patient Name:   Colin [name] [facility] Date of Exam: 08/20/2020 Medical Rec #:  [id number]    Height:       64.0 in Accession #:    [id number]   Weight:       143.3 lb Date of Birth:  04-05-1950    BSA:          1.698 m Patient Age:    70 years     BP:           104/67 mmHg Patient Gender: M            HR:           82 bpm. Exam Location:  Inpatient Procedure: 2D Echo Indications:     chest pain  History:         Patient has no prior history of Echocardiogram examinations.                  CAD; Risk Factors:Diabetes, Hypertension and Dyslipidemia.  Sonographer:     [name] RDCS Referring Phys:  [id number] CBSWH Tyreece Gelles Diagnosing Phys: Tessa Lerner DO IMPRESSIONS  1. Left ventricular ejection fraction, by estimation, is 30 to 35%. The left ventricle has moderate to severely decreased function. The left ventricle demonstrates global hypokinesis. The left ventricular internal cavity size was mildly dilated. Left ventricular diastolic parameters are consistent with Grade II diastolic dysfunction (pseudonormalization). Elevated left atrial pressure.  2. Right ventricular systolic function is mildly reduced. The right ventricular size is normal.  3. Left atrial size was mildly dilated.  4. The mitral valve is degenerative. Moderate mitral valve regurgitation. No evidence of mitral  stenosis.  5. The aortic valve is tricuspid. Aortic valve regurgitation is not visualized. Mild aortic valve sclerosis is present, with no evidence of aortic valve stenosis.  6. The inferior vena cava is normal in size with greater than 50% respiratory variability, suggesting right atrial pressure of 3 mmHg.  7. Evidence of atrial level shunting detected by color flow Doppler. FINDINGS  Left Ventricle: Left ventricular ejection fraction, by estimation, is 30 to 35%. The left ventricle has moderate to severely decreased function. The left ventricle demonstrates global hypokinesis. The left ventricular internal cavity size was mildly dilated. There is no left ventricular hypertrophy. Left ventricular diastolic parameters are consistent with Grade II diastolic dysfunction (pseudonormalization). Elevated left atrial pressure.  LV Wall Scoring: The anterior septum, inferior wall, mid inferoseptal segment, and basal inferoseptal segment are hypokinetic. Right Ventricle: The right ventricular size is normal. No increase in right ventricular wall thickness. Right ventricular systolic function is mildly reduced. Left Atrium: Left atrial size was mildly dilated. Right Atrium: Right atrial size was normal in size. Pericardium: There is no evidence of pericardial effusion. Mitral Valve: The mitral valve is degenerative in appearance. There is mild thickening of the mitral valve leaflet(s). Normal mobility of the mitral valve leaflets. Moderate mitral valve regurgitation. No evidence of mitral valve stenosis. Tricuspid Valve: The tricuspid valve is normal in structure. Tricuspid valve regurgitation is trivial. No evidence of tricuspid stenosis. Aortic Valve: The aortic valve is tricuspid. Aortic valve regurgitation is not visualized. Mild aortic valve sclerosis is present, with no evidence of aortic valve stenosis. Pulmonic Valve: The pulmonic valve was grossly normal. Pulmonic valve regurgitation is not visualized. No evidence  of pulmonic stenosis. Aorta: The aortic root and ascending aorta are structurally normal, with no evidence of dilitation. Venous: The inferior vena cava is normal in size with greater than 50% respiratory variability, suggesting right atrial pressure of 3 mmHg. IAS/Shunts: Evidence of atrial level shunting detected by color flow Doppler.  LEFT VENTRICLE PLAX 2D LVIDd:         5.20 cm      Diastology LVIDs:         4.60 cm      LV e' medial:    4.35 cm/s LV PW:         1.10 cm      LV E/e' medial:  22.5 LV IVS:        1.00 cm      LV e' lateral:   9.03 cm/s LVOT diam:     1.90 cm      LV E/e' lateral: 10.8 LV SV:         31 LV SV Index:   18 LVOT Area:     2.84 cm  LV Volumes (MOD) LV vol d, MOD A2C: 120.0 ml LV vol d, MOD A4C: 107.0 ml LV vol s, MOD A2C: 79.8 ml LV vol s, MOD A4C: 76.9 ml LV SV MOD A2C:     40.2 ml LV SV MOD A4C:     107.0 ml LV SV MOD BP:      36.1 ml RIGHT VENTRICLE             IVC RV S prime:     10.00 cm/s  IVC diam: 1.70 cm TAPSE (M-mode): 1.4 cm LEFT ATRIUM             Index       RIGHT ATRIUM           Index LA diam:        3.90 cm 2.30 cm/m  RA Area:     11.30 cm LA Vol (A2C):   66.6 ml 39.22 ml/m RA Volume:   24.10 ml  14.19 ml/m LA Vol (A4C):   50.8 ml 29.92 ml/m LA Biplane Vol: 58.4 ml 34.40 ml/m  AORTIC VALVE LVOT Vmax:   62.60 cm/s LVOT Vmean:  39.400 cm/s LVOT VTI:    0.109 m  AORTA Ao Root diam: 2.60 cm Ao Asc diam:  3.10 cm MITRAL VALVE MV Area (PHT): 5.97 cm      SHUNTS MV Decel Time: 127 msec      Systemic VTI:  0.11 m MR Peak grad:    106.1  mmHg  Systemic Diam: 1.90 cm MR Mean grad:    63.0 mmHg MR Vmax:         515.00 cm/s MR Vmean:        373.0 cm/s MR PISA:         1.27 cm MR PISA Eff ROA: 10 mm MR PISA Radius:  0.45 cm MV E velocity: 97.70 cm/s MV A velocity: 75.40 cm/s MV E/A ratio:  1.30 Chasten Blaze DO Electronically signed by Tessa Lerner DO Signature Date/Time: 08/20/2020/12:16:39 PM    Final    CT Angio Chest/Abd/Pel for Dissection W and/or W/WO  Result Date:  08/20/2020 CLINICAL DATA:  70 year old male with history of chest pain and back pain with shortness of breath since yesterday. EXAM: CT ANGIOGRAPHY CHEST, ABDOMEN AND PELVIS TECHNIQUE: Non-contrast CT of the chest was initially obtained. Multidetector CT imaging through the chest, abdomen and pelvis was performed using the standard protocol during bolus administration of intravenous contrast. Multiplanar reconstructed images and MIPs were obtained and reviewed to evaluate the vascular anatomy. CONTRAST:  OMNIPAQUE IOHEXOL 350 MG/ML SOLN COMPARISON:  No prior chest CT. CT the abdomen and pelvis 12/08/2010. FINDINGS: CTA CHEST FINDINGS Cardiovascular: Precontrast images demonstrate no crescentic high attenuation associated with the wall of the thoracic aorta to suggest acute intramural hemorrhage. There is extensive aortic atherosclerosis, without definite evidence of aneurysm or dissection of the thoracic aorta on today's non cardiac gated examination. Ascending aorta, mid aortic arch and descending aorta measure 3.2 cm, 2.5 cm and 2.6 cm in diameter respectively. Atherosclerotic calcifications are also noted in the left main, left anterior descending, left circumflex and right coronary arteries. Calcifications of the aortic valve. 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 Heart size is enlarged with left ventricular dilatation. There is no significant pericardial fluid, thickening or pericardial calcification. Mediastinum/Nodes: No pathologically enlarged mediastinal or hilar lymph nodes. Esophagus is unremarkable in appearance. No axillary lymphadenopathy. Lungs/Pleura: Moderate right and small left pleural effusions lying dependently. Patchy areas of mild ground-glass attenuation and diffuse interlobular septal thickening in the lungs, most compatible with a background of interstitial pulmonary edema. No confluent consolidative airspace disease. No pleural effusions.  No definite suspicious appearing pulmonary nodules or masses are noted. Musculoskeletal: There are no aggressive appearing lytic or blastic lesions noted in the visualized portions of the skeleton. Review of the MIP images confirms the above findings. CTA ABDOMEN AND PELVIS FINDINGS VASCULAR Aorta: Extensive atherosclerosis of the abdominal aorta with abundant atheromatous plaque, including several ulcerative plaques. No frank aortic aneurysm or dissection noted. No definite penetrating ulcer. Mild narrowing of the infrarenal abdominal aorta (minimal measurements of 10 x 6 mm shortly above the bifurcation). Celiac: Patent without evidence of aneurysm, dissection, vasculitis or significant stenosis. SMA: Patent without evidence of aneurysm, dissection, vasculitis or significant stenosis. Renals: Both renal arteries are patent without evidence of aneurysm, dissection, vasculitis, fibromuscular dysplasia or significant stenosis. IMA: Patent without evidence of aneurysm, dissection or vasculitis. Moderate stenosis at the ostium. Inflow: Extensive atherosclerosis. Patent without evidence of aneurysm, dissection, vasculitis or significant stenosis. Veins: No obvious venous abnormality within the limitations of this arterial phase study. Review of the MIP images confirms the above findings. NON-VASCULAR Hepatobiliary: No suspicious cystic or solid hepatic lesions. No intra or extrahepatic biliary ductal dilatation. Gallbladder is normal in appearance. Pancreas: No pancreatic mass. No pancreatic ductal dilatation. No pancreatic or peripancreatic fluid collections or inflammatory changes. Spleen: Unremarkable. Adrenals/Urinary Tract: Low-attenuation lesions in the kidneys bilaterally,  compatible with simple cysts, largest of which is in the interpolar region of the left kidney measuring 4.4 cm in diameter. No aggressive appearing renal lesions. Bilateral adrenal glands are normal in appearance. No hydroureteronephrosis.  Urinary bladder is normal in appearance. Stomach/Bowel: Normal appearance of the stomach. No pathologic dilatation of small bowel or colon. The appendix is not confidently identified and may be surgically absent. Regardless, there are no inflammatory changes noted adjacent to the cecum to suggest the presence of an acute appendicitis at this time. Lymphatic: No lymphadenopathy noted in the abdomen or pelvis. Reproductive: Prostate gland and seminal vesicles are unremarkable in appearance. Other: No significant volume of ascites.  No pneumoperitoneum. Musculoskeletal: There are no aggressive appearing lytic or blastic lesions noted in the visualized portions of the skeleton. Review of the MIP images confirms the above findings. IMPRESSION: 1. Severe atherosclerosis of the thoracoabdominal aorta, including multiple ulcerated plaques in the infrarenal abdominal aorta. However, there is no evidence of thoracic aortic aneurysm or dissection. 2. 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 3. Mild stenosis of the infrarenal abdominal aorta and moderate stenosis at the ostium of the inferior mesenteric artery. 4. Cardiomegaly with left ventricular dilatation. There is also evidence of interstitial pulmonary edema, along with moderate right and small left pleural effusions; imaging findings concerning for congestive heart failure. 5. Left main and 3 vessel coronary artery disease. Please note that although the presence of coronary artery calcium documents the presence of coronary artery disease, the severity of this disease and any potential stenosis cannot be assessed on this non-gated CT examination. Assessment for potential risk factor modification, dietary therapy or pharmacologic therapy may be warranted, if clinically indicated. 6. There are calcifications of the aortic valve. Echocardiographic correlation for evaluation of potential valvular dysfunction may be  warranted if clinically indicated. 7. Additional incidental findings, as above. Electronically Signed   By: Trudie Reed M.D.   On: 08/20/2020 07:27    Cardiac database: EKG: 05/06/2020: Normal sinus rhythm, 65 bpm, left axis deviation, old anteroseptal infarct, without underlying injury pattern.   08/20/2020: Normal sinus rhythm, 93 bpm, old anteroseptal infarct, ST-T changes in the lateral leads suggestive of ischemia, without underlying injury pattern.  Echocardiogram: 08/20/2020: 1. Left ventricular ejection fraction, by estimation, is 30 to 35%. The left ventricle has moderate to severely decreased function. The left ventricle demonstrates global hypokinesis. The left ventricular internal cavity size was mildly dilated. Left ventricular diastolic parameters are consistent with Grade II diastolic dysfunction (pseudonormalization). Elevated left atrial pressure.   2. Right ventricular systolic function is mildly reduced. The right ventricular size is normal.   3. Left atrial size was mildly dilated.   4. The mitral valve is degenerative. Moderate mitral valve regurgitation. No evidence of mitral stenosis.   5. The aortic valve is tricuspid. Aortic valve regurgitation is not visualized. Mild aortic valve sclerosis is present, with no evidence of aortic valve stenosis.   6. The inferior vena cava is normal in size with greater than 50% respiratory variability, suggesting right atrial pressure of 3 mmHg.   7. Evidence of atrial level shunting detected by color flow Doppler.  Heart catheterization: 04/21/2010 by Dr. Excell Seltzer:  1. Successful stenting of the right coronary artery with overlapping long drug-eluting stents. 2. Negative pressure wire analysis of the LAD  CTA Chest / Abd/ Pelvis 08/20/2020: 1. Severe atherosclerosis of the thoracoabdominal aorta, including multiple ulcerated plaques in the infrarenal abdominal aorta. However,  there is no evidence of thoracic aortic aneurysm  or dissection. 2. 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 3. Mild stenosis of the infrarenal abdominal aorta and moderate stenosis at the ostium of the inferior mesenteric artery. 4. Cardiomegaly with left ventricular dilatation. There is also evidence of interstitial pulmonary edema, along with moderate right and small left pleural effusions; imaging findings concerning for congestive heart failure. 5. Left main and 3 vessel coronary artery disease. Please note that although the presence of coronary artery calcium documents the presence of coronary artery disease, the severity of this disease and any potential stenosis cannot be assessed on this non-gated CT examination. Assessment for potential risk factor modification, dietary therapy or pharmacologic therapy may be warranted, if clinically indicated. 6. There are calcifications of the aortic valve. Echocardiographic correlation for evaluation of potential valvular dysfunction may be warranted if clinically indicated. 7. Additional incidental findings, as above.  Scheduled Meds:  aspirin EC  81 mg Oral Daily   dapagliflozin propanediol  10 mg Oral Daily   docusate sodium  100 mg Oral BID   furosemide  40 mg Intravenous BID   gabapentin  100 mg Oral TID   insulin aspart  0-15 Units Subcutaneous TID WC   insulin aspart  0-5 Units Subcutaneous QHS   insulin glargine  60 Units Subcutaneous Daily   potassium chloride  40 mEq Oral Once   rosuvastatin  40 mg Oral QHS   sodium chloride flush  3 mL Intravenous Q12H    Continuous Infusions:  heparin 1,000 Units/hr (08/21/20 1216)   nitroGLYCERIN 2.5 mcg/min (08/20/20 1117)    PRN Meds: acetaminophen **OR** acetaminophen, bisacodyl, hydrALAZINE, morphine injection, ondansetron **OR** ondansetron (ZOFRAN) IV, oxyCODONE, polyethylene glycol, traZODone   IMPRESSION & RECOMMENDATIONS: Colin [name] is a 70 y.o. male whose  past medical history and cardiac risk factors include: History of non-STEMI, established CAD w/ prior PCI to LCx and RCA, coronary artery calcification on non-gated CT study, IDDM Type 2, Hyperlipidemia, Smoking, Hypertension, Severe atherosclerosis of the thoracoabdominal aorta (CTA 08/20/2020).  Non-STEMI: Symptoms on presentation very suggestive of angina pectoris, high sensitive troponins peaked 970, and ECGs suggestive of ischemia. Prior history of non-STEMI status post PCI to the LCx and RCA. No longitudinal cardiovascular follow-up as outpatient despite extensive cardiac history. Currently on IV heparin and nitro drip Continue IV Lasix and Farxiga to facilitate diuresis and improve LVEDP Scheduled for right and left heart catheterization tomorrow. Discussed risks, benefits, and alternatives with the patient and his son yesterday. NPO after midnight. Continue aspirin 81 mg p.o. daily Fasting lipid profile notes direct LDL of 137 mg/dL, Crestor uptitrated to 40 mg p.o. nightly yesterday. Telemetry reviewed, normal sinus rhythm.  Acute combined systolic and diastolic heart failure, stage C, NYHA class III: Interstitial edema noted on chest x-ray, physical examination consistent with congestive heart failure, BNP on admission at 1079, echocardiogram notes grade 2 diastolic impairment with elevated LAP. Negative net urine output of 3 L since admission. Will uptitrate GDMT status post left and right heart catheterization Further recommendations to follow.  Acute kidney injury on chronic kidney disease: Mostly secondary to diuresis due to heart failure with reduced EF. Will decrease Lasix to once a day. Continue Farxiga. Monitor BUN and creatinine.  Hypokalemia: Most likely secondary to diuresis. K-Dur 40 mEq po once, has be ordered.   Insulin-dependent diabetes mellitus type 2: Not well controlled. Last hemoglobin A1c 10.7 Currently management primary team.  Cigarette  smoking: Currently smokes 0.5 packs/day. Educated on importance of complete smoking cessation.  Multivessel coronary artery calcification noted on not gated CT study: Continue aspirin and statin therapy.  Severe atherosclerosis of the thoracoabdominal aorta: Continue aspirin and statin therapy.  Patient's questions and concerns were addressed to his satisfaction. He voices understanding of the instructions provided during this encounter.   This note was created using a voice recognition software as a result there may be grammatical errors inadvertently enclosed that do not reflect the nature of this encounter. Every attempt is made to correct such errors.  Total time spent: >35 minutes.  Tessa Lerner, DO, [facility] Office: [phone] 08/21/2020, 1:18 PM

## 2020-08-21 NOTE — Plan of Care (Signed)
  Problem: Health Behavior/Discharge Planning: Goal: Ability to manage health-related needs will improve Outcome: Progressing   Problem: Clinical Measurements: Goal: Ability to maintain clinical measurements within normal limits will improve Outcome: Progressing Goal: Will remain free from infection Outcome: Progressing Goal: Diagnostic test results will improve Outcome: Progressing Goal: Respiratory complications will improve Outcome: Progressing Goal: Cardiovascular complication will be avoided Outcome: Progressing   Problem: Nutrition: Goal: Adequate nutrition will be maintained Outcome: Progressing   Problem: Pain Managment: Goal: General experience of comfort will improve Outcome: Progressing   Problem: Safety: Goal: Ability to remain free from injury will improve Outcome: Progressing   Problem: Skin Integrity: Goal: Risk for impaired skin integrity will decrease Outcome: Progressing   Problem: Cardiac: Goal: Ability to achieve and maintain adequate cardiopulmonary perfusion will improve Outcome: Progressing

## 2020-08-22 ENCOUNTER — Encounter (HOSPITAL_COMMUNITY): Payer: Self-pay | Admitting: Cardiology

## 2020-08-22 ENCOUNTER — Encounter (HOSPITAL_COMMUNITY)
Admission: EM | Disposition: A | Discharge status: Home Health | Payer: Self-pay | Source: Home / Self Care | Attending: Thoracic Surgery (Cardiothoracic Vascular Surgery)

## 2020-08-22 ENCOUNTER — Inpatient Hospital Stay (HOSPITAL_COMMUNITY): Payer: Medicare HMO

## 2020-08-22 DIAGNOSIS — I214 Non-ST elevation (NSTEMI) myocardial infarction: Secondary | ICD-10-CM

## 2020-08-22 DIAGNOSIS — I340 Nonrheumatic mitral (valve) insufficiency: Secondary | ICD-10-CM

## 2020-08-22 DIAGNOSIS — I509 Heart failure, unspecified: Secondary | ICD-10-CM | POA: Diagnosis not present

## 2020-08-22 DIAGNOSIS — I5020 Unspecified systolic (congestive) heart failure: Secondary | ICD-10-CM

## 2020-08-22 DIAGNOSIS — I5043 Acute on chronic combined systolic (congestive) and diastolic (congestive) heart failure: Secondary | ICD-10-CM

## 2020-08-22 DIAGNOSIS — Z01810 Encounter for preprocedural cardiovascular examination: Secondary | ICD-10-CM | POA: Diagnosis not present

## 2020-08-22 DIAGNOSIS — E785 Hyperlipidemia, unspecified: Secondary | ICD-10-CM | POA: Diagnosis not present

## 2020-08-22 DIAGNOSIS — I455 Other specified heart block: Secondary | ICD-10-CM

## 2020-08-22 DIAGNOSIS — E1169 Type 2 diabetes mellitus with other specified complication: Secondary | ICD-10-CM | POA: Diagnosis not present

## 2020-08-22 DIAGNOSIS — I25110 Atherosclerotic heart disease of native coronary artery with unstable angina pectoris: Secondary | ICD-10-CM

## 2020-08-22 HISTORY — PX: RIGHT/LEFT HEART CATH AND CORONARY ANGIOGRAPHY: CATH118266

## 2020-08-22 HISTORY — PX: TEMPORARY PACEMAKER: CATH118268

## 2020-08-22 LAB — POCT I-STAT EG7
Acid-Base Excess: 4 mmol/L — ABNORMAL HIGH (ref 0.0–2.0)
Bicarbonate: 29.7 mmol/L — ABNORMAL HIGH (ref 20.0–28.0)
Calcium, Ion: 1.25 mmol/L (ref 1.15–1.40)
HCT: 36 % — ABNORMAL LOW (ref 39.0–52.0)
Hemoglobin: 12.2 g/dL — ABNORMAL LOW (ref 13.0–17.0)
O2 Saturation: 74 %
Potassium: 3.8 mmol/L (ref 3.5–5.1)
Sodium: 144 mmol/L (ref 135–145)
TCO2: 31 mmol/L (ref 22–32)
pCO2, Ven: 50.5 mmHg (ref 44.0–60.0)
pH, Ven: 7.378 (ref 7.250–7.430)
pO2, Ven: 41 mmHg (ref 32.0–45.0)

## 2020-08-22 LAB — BASIC METABOLIC PANEL
Anion gap: 7 (ref 5–15)
BUN: 32 mg/dL — ABNORMAL HIGH (ref 8–23)
CO2: 27 mmol/L (ref 22–32)
Calcium: 8.7 mg/dL — ABNORMAL LOW (ref 8.9–10.3)
Chloride: 107 mmol/L (ref 98–111)
Creatinine, Ser: 1.49 mg/dL — ABNORMAL HIGH (ref 0.61–1.24)
GFR, Estimated: 50 mL/min — ABNORMAL LOW (ref >60)
Glucose, Bld: 209 mg/dL — ABNORMAL HIGH (ref 70–99)
Potassium: 3.7 mmol/L (ref 3.5–5.1)
Sodium: 141 mmol/L (ref 135–145)

## 2020-08-22 LAB — CBC
HCT: 34.5 % — ABNORMAL LOW (ref 39.0–52.0)
Hemoglobin: 11.5 g/dL — ABNORMAL LOW (ref 13.0–17.0)
MCH: 31.2 pg (ref 26.0–34.0)
MCHC: 33.3 g/dL (ref 30.0–36.0)
MCV: 93.5 fL (ref 80.0–100.0)
Platelets: 207 10*3/uL (ref 150–400)
RBC: 3.69 MIL/uL — ABNORMAL LOW (ref 4.22–5.81)
RDW: 13 % (ref 11.5–15.5)
WBC: 7.2 10*3/uL (ref 4.0–10.5)
nRBC: 0 % (ref 0.0–0.2)

## 2020-08-22 LAB — GLUCOSE, CAPILLARY
Glucose-Capillary: 189 mg/dL — ABNORMAL HIGH (ref 70–99)
Glucose-Capillary: 237 mg/dL — ABNORMAL HIGH (ref 70–99)
Glucose-Capillary: 242 mg/dL — ABNORMAL HIGH (ref 70–99)

## 2020-08-22 LAB — PREPARE RBC (CROSSMATCH)

## 2020-08-22 LAB — SURGICAL PCR SCREEN
MRSA, PCR: NEGATIVE
Staphylococcus aureus: NEGATIVE

## 2020-08-22 LAB — HEPARIN LEVEL (UNFRACTIONATED): Heparin Unfractionated: 0.26 IU/mL — ABNORMAL LOW (ref 0.30–0.70)

## 2020-08-22 LAB — ABO/RH: ABO/RH(D): AB POS

## 2020-08-22 LAB — MAGNESIUM: Magnesium: 2 mg/dL (ref 1.7–2.4)

## 2020-08-22 LAB — BRAIN NATRIURETIC PEPTIDE: B Natriuretic Peptide: 726.1 pg/mL — ABNORMAL HIGH (ref 0.0–100.0)

## 2020-08-22 SURGERY — RIGHT/LEFT HEART CATH AND CORONARY ANGIOGRAPHY
Anesthesia: LOCAL

## 2020-08-22 MED ORDER — SODIUM CHLORIDE 0.9 % IV SOLN: INTRAVENOUS | Status: DC

## 2020-08-22 MED ORDER — SODIUM CHLORIDE 0.9 % IV SOLN: 250.0000 mL | INTRAVENOUS | Status: DC | PRN

## 2020-08-22 MED ORDER — ASPIRIN 81 MG PO CHEW
81.0000 mg | CHEWABLE_TABLET | ORAL | Status: AC
  Administered 2020-08-22: 81 mg via ORAL
  Filled 2020-08-22: qty 1

## 2020-08-22 MED ORDER — ACETAMINOPHEN 325 MG PO TABS: 650.0000 mg | ORAL_TABLET | ORAL | Status: DC | PRN

## 2020-08-22 MED ORDER — ASPIRIN EC 81 MG PO TBEC
81.0000 mg | DELAYED_RELEASE_TABLET | Freq: Every day | ORAL | Status: DC
  Administered 2020-08-23: 81 mg via ORAL
  Filled 2020-08-22: qty 1

## 2020-08-22 MED ORDER — ACETAMINOPHEN 325 MG PO TABS: 650.0000 mg | ORAL_TABLET | Freq: Four times a day (QID) | ORAL | Status: DC | PRN

## 2020-08-22 MED ORDER — VERAPAMIL HCL 2.5 MG/ML IV SOLN
INTRAVENOUS | Status: DC | PRN
  Administered 2020-08-22: 10 mL via INTRA_ARTERIAL

## 2020-08-22 MED ORDER — CHLORHEXIDINE GLUCONATE CLOTH 2 % EX PADS
6.0000 | MEDICATED_PAD | Freq: Once | CUTANEOUS | Status: AC
  Administered 2020-08-22: 6 via TOPICAL

## 2020-08-22 MED ORDER — SODIUM CHLORIDE 0.9% FLUSH: 3.0000 mL | INTRAVENOUS | Status: DC | PRN

## 2020-08-22 MED ORDER — SODIUM CHLORIDE 0.9 % IV SOLN
INTRAVENOUS | Status: DC
  Filled 2020-08-22: qty 30

## 2020-08-22 MED ORDER — CHLORHEXIDINE GLUCONATE 0.12 % MT SOLN
15.0000 mL | Freq: Once | OROMUCOSAL | Status: AC
  Administered 2020-08-23: 15 mL via OROMUCOSAL
  Filled 2020-08-22: qty 15

## 2020-08-22 MED ORDER — HEPARIN (PORCINE) IN NACL 1000-0.9 UT/500ML-% IV SOLN
INTRAVENOUS | Status: DC | PRN
  Administered 2020-08-22 (×2): 500 mL

## 2020-08-22 MED ORDER — METOPROLOL TARTRATE 12.5 MG HALF TABLET
12.5000 mg | ORAL_TABLET | Freq: Once | ORAL | Status: AC
  Administered 2020-08-23: 12.5 mg via ORAL
  Filled 2020-08-22: qty 1

## 2020-08-22 MED ORDER — BISACODYL 5 MG PO TBEC
5.0000 mg | DELAYED_RELEASE_TABLET | Freq: Once | ORAL | Status: AC
  Administered 2020-08-22: 5 mg via ORAL
  Filled 2020-08-22: qty 1

## 2020-08-22 MED ORDER — HEPARIN SODIUM (PORCINE) 1000 UNIT/ML IJ SOLN
INTRAMUSCULAR | Status: DC | PRN
  Administered 2020-08-22: 3000 [IU] via INTRAVENOUS

## 2020-08-22 MED ORDER — POTASSIUM CHLORIDE CRYS ER 20 MEQ PO TBCR
40.0000 meq | EXTENDED_RELEASE_TABLET | Freq: Once | ORAL | Status: AC
  Administered 2020-08-22: 40 meq via ORAL
  Filled 2020-08-22: qty 2

## 2020-08-22 MED ORDER — ASPIRIN 81 MG PO CHEW: 81.0000 mg | CHEWABLE_TABLET | ORAL | Status: DC

## 2020-08-22 MED ORDER — DEXMEDETOMIDINE HCL IN NACL 400 MCG/100ML IV SOLN
0.1000 ug/kg/h | INTRAVENOUS | Status: AC
  Administered 2020-08-23: .2 ug/kg/h via INTRAVENOUS
  Filled 2020-08-22: qty 100

## 2020-08-22 MED ORDER — IOHEXOL 350 MG/ML SOLN
INTRAVENOUS | Status: DC | PRN
  Administered 2020-08-22: 35 mL

## 2020-08-22 MED ORDER — CHLORHEXIDINE GLUCONATE CLOTH 2 % EX PADS
6.0000 | MEDICATED_PAD | Freq: Every day | CUTANEOUS | Status: DC
  Administered 2020-08-22 – 2020-08-27 (×6): 6 via TOPICAL

## 2020-08-22 MED ORDER — EPINEPHRINE HCL 5 MG/250ML IV SOLN IN NS
0.0000 ug/min | INTRAVENOUS | Status: DC
  Filled 2020-08-22: qty 250

## 2020-08-22 MED ORDER — FENTANYL CITRATE (PF) 100 MCG/2ML IJ SOLN
INTRAMUSCULAR | Status: DC | PRN
  Administered 2020-08-22 (×2): 25 ug via INTRAVENOUS

## 2020-08-22 MED ORDER — TRANEXAMIC ACID (OHS) PUMP PRIME SOLUTION
2.0000 mg/kg | INTRAVENOUS | Status: DC
  Filled 2020-08-22: qty 1.15

## 2020-08-22 MED ORDER — VANCOMYCIN HCL 1250 MG/250ML IV SOLN
1250.0000 mg | INTRAVENOUS | Status: AC
  Administered 2020-08-23: 1250 mg via INTRAVENOUS
  Filled 2020-08-22: qty 250

## 2020-08-22 MED ORDER — NITROGLYCERIN IN D5W 200-5 MCG/ML-% IV SOLN
2.0000 ug/min | INTRAVENOUS | Status: DC
  Filled 2020-08-22: qty 250

## 2020-08-22 MED ORDER — PLASMA-LYTE A IV SOLN
INTRAVENOUS | Status: DC
  Filled 2020-08-22: qty 5

## 2020-08-22 MED ORDER — TRANEXAMIC ACID (OHS) BOLUS VIA INFUSION
15.0000 mg/kg | INTRAVENOUS | Status: AC
  Administered 2020-08-23: 865.5 mg via INTRAVENOUS
  Filled 2020-08-22: qty 866

## 2020-08-22 MED ORDER — LIDOCAINE HCL (PF) 1 % IJ SOLN
INTRAMUSCULAR | Status: AC
  Filled 2020-08-22: qty 30

## 2020-08-22 MED ORDER — MILRINONE LACTATE IN DEXTROSE 20-5 MG/100ML-% IV SOLN
0.3000 ug/kg/min | INTRAVENOUS | Status: DC
  Filled 2020-08-22: qty 100

## 2020-08-22 MED ORDER — INSULIN GLARGINE 100 UNIT/ML ~~LOC~~ SOLN
10.0000 [IU] | Freq: Every day | SUBCUTANEOUS | Status: DC
  Administered 2020-08-22 – 2020-08-23 (×2): 10 [IU] via SUBCUTANEOUS
  Filled 2020-08-22 (×2): qty 0.1

## 2020-08-22 MED ORDER — HYDRALAZINE HCL 20 MG/ML IJ SOLN: 10.0000 mg | INTRAMUSCULAR | Status: AC | PRN

## 2020-08-22 MED ORDER — CEFAZOLIN SODIUM-DEXTROSE 2-4 GM/100ML-% IV SOLN
2.0000 g | INTRAVENOUS | Status: AC
  Administered 2020-08-23: 2 g via INTRAVENOUS
  Filled 2020-08-22: qty 100

## 2020-08-22 MED ORDER — ADULT MULTIVITAMIN W/MINERALS CH
1.0000 | ORAL_TABLET | Freq: Every day | ORAL | Status: DC
  Administered 2020-08-23 – 2020-08-31 (×9): 1 via ORAL
  Filled 2020-08-22 (×9): qty 1

## 2020-08-22 MED ORDER — TEMAZEPAM 15 MG PO CAPS
15.0000 mg | ORAL_CAPSULE | Freq: Once | ORAL | Status: AC | PRN
  Administered 2020-08-22: 15 mg via ORAL
  Filled 2020-08-22: qty 1

## 2020-08-22 MED ORDER — SODIUM CHLORIDE 0.9% FLUSH
3.0000 mL | Freq: Two times a day (BID) | INTRAVENOUS | Status: DC
  Administered 2020-08-22 – 2020-08-23 (×3): 3 mL via INTRAVENOUS

## 2020-08-22 MED ORDER — LABETALOL HCL 5 MG/ML IV SOLN: 10.0000 mg | INTRAVENOUS | Status: AC | PRN

## 2020-08-22 MED ORDER — ACETAMINOPHEN 650 MG RE SUPP: 650.0000 mg | Freq: Four times a day (QID) | RECTAL | Status: DC | PRN

## 2020-08-22 MED ORDER — TRANEXAMIC ACID 1000 MG/10ML IV SOLN
1.5000 mg/kg/h | INTRAVENOUS | Status: AC
  Administered 2020-08-23: 1.5 mg/kg/h via INTRAVENOUS
  Filled 2020-08-22: qty 25

## 2020-08-22 MED ORDER — FENTANYL CITRATE (PF) 100 MCG/2ML IJ SOLN
INTRAMUSCULAR | Status: AC
  Filled 2020-08-22: qty 2

## 2020-08-22 MED ORDER — HEPARIN SODIUM (PORCINE) 1000 UNIT/ML IJ SOLN
INTRAMUSCULAR | Status: AC
  Filled 2020-08-22: qty 1

## 2020-08-22 MED ORDER — MANNITOL 20 % IV SOLN
Freq: Once | INTRAVENOUS | Status: DC
  Filled 2020-08-22: qty 13

## 2020-08-22 MED ORDER — LIDOCAINE HCL (PF) 1 % IJ SOLN
INTRAMUSCULAR | Status: DC | PRN
  Administered 2020-08-22: 5 mL
  Administered 2020-08-22 (×2): 2 mL
  Administered 2020-08-22: 3 mL
  Administered 2020-08-22: 2 mL

## 2020-08-22 MED ORDER — ISOSORBIDE MONONITRATE ER 30 MG PO TB24
30.0000 mg | ORAL_TABLET | Freq: Every day | ORAL | Status: DC
  Administered 2020-08-22 – 2020-08-23 (×2): 30 mg via ORAL
  Filled 2020-08-22 (×2): qty 1

## 2020-08-22 MED ORDER — POTASSIUM CHLORIDE 2 MEQ/ML IV SOLN
80.0000 meq | INTRAVENOUS | Status: DC
  Filled 2020-08-22: qty 40

## 2020-08-22 MED ORDER — HEPARIN (PORCINE) 25000 UT/250ML-% IV SOLN
1200.0000 [IU]/h | INTRAVENOUS | Status: DC
  Administered 2020-08-22: 1100 [IU]/h via INTRAVENOUS
  Filled 2020-08-22: qty 250

## 2020-08-22 MED ORDER — MIDAZOLAM HCL 2 MG/2ML IJ SOLN
INTRAMUSCULAR | Status: AC
  Filled 2020-08-22: qty 2

## 2020-08-22 MED ORDER — VERAPAMIL HCL 2.5 MG/ML IV SOLN
INTRAVENOUS | Status: AC
  Filled 2020-08-22: qty 2

## 2020-08-22 MED ORDER — ONDANSETRON HCL 4 MG/2ML IJ SOLN: 4.0000 mg | Freq: Four times a day (QID) | INTRAMUSCULAR | Status: DC | PRN

## 2020-08-22 MED ORDER — CHLORHEXIDINE GLUCONATE CLOTH 2 % EX PADS
6.0000 | MEDICATED_PAD | Freq: Once | CUTANEOUS | Status: AC
  Administered 2020-08-23: 6 via TOPICAL

## 2020-08-22 MED ORDER — MIDAZOLAM HCL 2 MG/2ML IJ SOLN
INTRAMUSCULAR | Status: DC | PRN
  Administered 2020-08-22: 1 mg via INTRAVENOUS

## 2020-08-22 MED ORDER — SODIUM CHLORIDE 0.9 % IV SOLN
INTRAVENOUS | Status: AC | PRN
Start: 2020-08-22 — End: 2020-08-22
  Administered 2020-08-22: 10 mL/h via INTRAVENOUS

## 2020-08-22 MED ORDER — GLUCERNA SHAKE PO LIQD
237.0000 mL | Freq: Three times a day (TID) | ORAL | Status: DC
  Administered 2020-08-24 – 2020-08-31 (×12): 237 mL via ORAL

## 2020-08-22 MED ORDER — NOREPINEPHRINE 4 MG/250ML-% IV SOLN
0.0000 ug/min | INTRAVENOUS | Status: DC
Start: 2020-08-23 — End: 2020-08-23
  Filled 2020-08-22: qty 250

## 2020-08-22 MED ORDER — PHENYLEPHRINE HCL-NACL 20-0.9 MG/250ML-% IV SOLN
30.0000 ug/min | INTRAVENOUS | Status: AC
  Administered 2020-08-23: 20 ug/min via INTRAVENOUS
  Filled 2020-08-22: qty 250

## 2020-08-22 MED ORDER — HEPARIN (PORCINE) IN NACL 1000-0.9 UT/500ML-% IV SOLN
INTRAVENOUS | Status: AC
  Filled 2020-08-22: qty 1000

## 2020-08-22 MED ORDER — INSULIN REGULAR(HUMAN) IN NACL 100-0.9 UT/100ML-% IV SOLN
INTRAVENOUS | Status: DC
  Filled 2020-08-22: qty 100

## 2020-08-22 SURGICAL SUPPLY — 21 items
CABLE ADAPT PACING TEMP 12FT (ADAPTER) ×2
CATH BALLN WEDGE 5F 110CM (CATHETERS) ×2
CATH INFINITI 5 FR 3DRC (CATHETERS) ×2
CATH INFINITI 5 FR JL3.5 (CATHETERS) ×2
CATH INFINITI 5FR MULTPACK ANG (CATHETERS) ×2
CATH S G BIP PACING (CATHETERS) ×2
DEVICE RAD COMP TR BAND LRG (VASCULAR PRODUCTS) ×2
GLIDESHEATH SLEND A-KIT 6F 22G (SHEATH) ×2
GUIDEWIRE .025 260CM (WIRE) ×2
INQWIRE 1.5J .035X260CM (WIRE) ×2
KIT HEART LEFT (KITS) ×2
KIT MICROPUNCTURE NIT STIFF (SHEATH) ×2
PACK CARDIAC CATHETERIZATION (CUSTOM PROCEDURE TRAY) ×2
SHEATH GLIDE SLENDER 4/5FR (SHEATH) ×2
SHEATH PINNACLE 6F 10CM (SHEATH) ×2
SHEATH PROBE COVER 6X72 (BAG) ×2
SLEEVE REPOSITIONING LENGTH 30 (MISCELLANEOUS) ×2
TRANSDUCER W/STOPCOCK (MISCELLANEOUS) ×2
TUBING CIL FLEX 10 FLL-RA (TUBING) ×2
WIRE EMERALD 3MM-J .035X150CM (WIRE) ×2
WIRE HI TORQ VERSACORE-J 145CM (WIRE) ×2

## 2020-08-22 NOTE — Consult Note (Addendum)
Advanced Heart Failure Team Consult Note   Primary Physician: Kerin Perna, NP PCP-Cardiologist:  Dr. Acie Fredrickson, has not seen for several years.   Reason for Consultation: Acute Systolic Heart Failure   HPI:    Colin [name] is seen today for evaluation of acute systolic at the request of Dr Kipp Brood.   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   Presented to [facility] with chest pain. HS Trop elevated so cardiology consulted. HS Trop [id number]. CTA negative for dissection. CXR with pulmonary edema. Diuresed with IV lasix. Placed on heparin and nitro drip. ECHO completed and showed EF 30-35% with mildly reduced RV and WMA.  On 08/20/20 had EKG changes -->NSTEMI. Today had chest pain with 5 second pauses. Placed on nitro drip + heparin . Cath moved up and showed multivessel diseased. Temporary pacer placed. Off nitro drip after cath.   Denies chest pain.   Cath today  LM: Minimal disease LAD: Prox-mid long 80% stenosis Lcx: Lcx/OM1 95% ISR RCA: Prox 80% ISR, followed by In-stent CTO mid-distal vessel Left-to-right collaterals to distal vessel Mildly decompensated ischemic cardiomyopathy RA 1 PA 34/12  PCWP 17 CO 5.9  CI 3.7  Temp pacer placed through RIJ CT surgery consulted.   Echo 08/20/20 -EF 30-35% RV mildly reduced. WMA noted. RA pressure 3.   CTA Chest / Abd/ Pelvis 08/20/2020: 1. Severe atherosclerosis of the thoracoabdominal aorta, including multiple ulcerated plaques in the infrarenal abdominal aorta. However, there is no evidence of thoracic aortic aneurysm or dissection. 2. 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 3. Mild stenosis of the infrarenal abdominal aorta and moderate stenosis at the ostium of the inferior mesenteric artery. 4. Cardiomegaly with left ventricular  dilatation. There is also evidence of interstitial pulmonary edema, along with moderate right and small left pleural effusions; imaging findings concerning for congestive heart failure. 5. Left main and 3 vessel coronary artery disease. Please note that although the presence of coronary artery calcium documents the presence of coronary artery disease, the severity of this disease and any potential stenosis cannot be assessed on this non-gated CT examination. Assessment for potential risk factor modification, dietary therapy or pharmacologic therapy may be warranted, if clinically indicated. 6. There are calcifications of the aortic valve. Echocardiographic correlation for evaluation of potential valvular dysfunction may be warranted if clinically indicated. 7. Additional incidental findings, as above. Review of Systems: [y] = yes, [ ]  = no   General: Weight gain [ ] ; Weight loss [ ] ; Anorexia [ ] ; Fatigue [ ] ; Fever [ ] ; Chills [ ] ; Weakness [ ]   Cardiac: Chest pain/pressure [Y ]; Resting SOB [ ] ; Exertional SOB [ ] ; Orthopnea [ ] ; Pedal Edema [ ] ; Palpitations [ ] ; Syncope [ ] ; Presyncope [ ] ; Paroxysmal nocturnal dyspnea[ ]   Pulmonary: Cough [ ] ; Wheezing[ ] ; Hemoptysis[ ] ; Sputum [ ] ; Snoring [ ]   GI: Vomiting[ ] ; Dysphagia[ ] ; Melena[ ] ; Hematochezia [ ] ; Heartburn[ ] ; Abdominal pain [ ] ; Constipation [ ] ; Diarrhea [ ] ; BRBPR [ ]   GU: Hematuria[ ] ; Dysuria [ ] ; Nocturia[ ]   Vascular: Pain in legs with walking [ ] ; Pain in feet with lying flat [ ] ; Non-healing sores [ ] ; Stroke [ ] ; TIA [ ] ; Slurred speech [ ] ;  Neuro: Headaches[ ] ; Vertigo[ ] ; Seizures[ ] ; Paresthesias[ ] ;  Blurred vision [ ] ; Diplopia [ ] ; Vision changes [ ]   Ortho/Skin: Arthritis [ ] ; Joint pain [ ] ; Muscle pain [ ] ; Joint swelling [ ] ; Back Pain [ ] ; Rash [ ]   Psych: Depression[ ] ; Anxiety[ ]   Heme: Bleeding problems [ ] ; Clotting disorders [ ] ; Anemia [ ]   Endocrine: Diabetes [ Y]; Thyroid dysfunction[ ]   Home  Medications Prior to Admission medications   Medication Sig Start Date End Date Taking? Authorizing Provider  insulin glargine (LANTUS) 100 UNIT/ML Solostar Pen Inject 60 Units into the skin every morning. Patient taking differently: Inject 30 Units into the skin every morning. 05/30/20 05/30/21 Yes Renato Shin, MD  Accu-Chek Softclix Lancets lancets Use to check blood sugar three times daily. 09/23/19   Charlott Rakes, MD  aspirin 81 MG tablet Take 81 mg by mouth daily. Patient not taking: Reported on 08/20/2020    [provider]  Blood Glucose Monitoring Suppl (ACCU-CHEK GUIDE) w/Device KIT USE AS DIRECTED 08/04/20   Renato Shin, MD  gabapentin (NEURONTIN) 100 MG capsule TAKE 1 CAPSULE (100 MG TOTAL) BY MOUTH 3 (THREE) TIMES DAILY. Patient not taking: No sig reported 05/06/20 05/06/21  Kerin Perna, NP  glucose blood (ACCU-CHEK GUIDE) test strip 1 each by Other route 2 (two) times daily. And lancets 2/day 08/04/20   Renato Shin, MD  Insulin Pen Needle (TRUEPLUS PEN NEEDLES) 32G X 4 MM MISC Use as instructed to inject Lantus and Victoza once daily. 05/06/20   Meccariello, Bernita Raisin, DO  losartan-hydrochlorothiazide (HYZAAR) 100-25 MG tablet TAKE 1 TABLET BY MOUTH DAILY. Patient not taking: No sig reported 05/06/20 05/06/21  Meccariello, Bernita Raisin, DO  rosuvastatin (CRESTOR) 20 MG tablet TAKE 1 TABLET (20 MG TOTAL) BY MOUTH DAILY. Patient not taking: No sig reported 05/30/20 05/30/21  Kerin Perna, NP    Past Medical History: Past Medical History:  Diagnosis Date   Arrhythmia    CAD (coronary artery disease)    stent x 2   Carotid artery occlusion    Diabetes mellitus    Dyslipidemia    Essential hypertension     Past Surgical History: Past Surgical History:  Procedure Laterality Date   CARDIAC CATHETERIZATION     01/2010 ; 04/2010    Family History: Family History  Problem Relation Age of Onset   Diabetes Neg Hx     Social History: Social History    Socioeconomic History   Marital status: Married    Spouse name: Not on file   Number of children: Not on file   Years of education: Not on file   Highest education level: Not on file  Occupational History   Not on file  Tobacco Use   Smoking status: Every Day    Packs/day: 0.25    Types: Cigarettes   Smokeless tobacco: Never  Vaping Use   Vaping Use: Never used  Substance and Sexual Activity   Alcohol use: No   Drug use: No   Sexual activity: Not on file  Other Topics Concern   Not on file  Social History Narrative   Not on file   Social Determinants of Health   Financial Resource Strain: Not on file  Food Insecurity: Not on file  Transportation Needs: Not on file  Physical Activity: Not on file  Stress: Not on file  Social Connections: Not on file    Allergies:  No Known Allergies  Objective:    Vital Signs:   Temp:  [98.3 F (36.8 C)-99.5 F (  37.5 C)] 98.3 F (36.8 C) (07/18 0751) Pulse Rate:  [23-80] 23 (07/18 1239) Resp:  [0-20] 6 (07/18 1239) BP: (94-140)/(53-105) 104/68 (07/18 1239) SpO2:  [0 %-100 %] 0 % (07/18 1239) Weight:  [57.7 kg] 57.7 kg (07/18 0341) Last BM Date: 08/19/20  Weight change: Filed Weights   08/20/20 0446 08/21/20 0454 08/22/20 0341  Weight: 65 kg 62.8 kg 57.7 kg    Intake/Output:   Intake/Output Summary (Last 24 hours) at 08/22/2020 1333 Last data filed at 08/22/2020 0700 Gross per 24 hour  Intake 480.72 ml  Output 2450 ml  Net -1969.28 ml      Physical Exam    General:  . No resp difficulty HEENT: normal Neck: supple. JVP flat . Carotids 2+ bilat; no bruits. No lymphadenopathy or thyromegaly appreciated. RIJ Temp pacer  Cor: PMI nondisplaced. Regular rate & rhythm. No rubs, gallops or murmurs. Lungs: clear Abdomen: soft, nontender, nondistended. No hepatosplenomegaly. No bruits or masses. Good bowel sounds. Extremities: no cyanosis, clubbing, rash, edema Neuro: alert & orientedx3, cranial nerves grossly intact.  moves all 4 extremities w/o difficulty. Affect pleasant   Telemetry  SR 70-80s   EKG  SR with ST depression V5 and V6.   Labs   Basic Metabolic Panel: Recent Labs  Lab 08/20/20 0449 08/21/20 0039 08/22/20 0312  NA 138 142 141  K 3.9 3.2* 3.7  CL 107 105 107  CO2 26 32 27  GLUCOSE 350* 38* 209*  BUN 17 19 32*  CREATININE 1.19 1.54* 1.49*  CALCIUM 9.6 9.5 8.7*  MG  --   --  2.0    Liver Function Tests: No results for input(s): AST, ALT, ALKPHOS, BILITOT, PROT, ALBUMIN in the last 168 hours. No results for input(s): LIPASE, AMYLASE in the last 168 hours. No results for input(s): AMMONIA in the last 168 hours.  CBC: Recent Labs  Lab 08/20/20 0449 08/21/20 0039 08/22/20 0312  WBC 10.5 8.5 7.2  HGB 13.4 13.0 11.5*  HCT 39.3 39.0 34.5*  MCV 92.0 92.9 93.5  PLT 227 238 207    Cardiac Enzymes: No results for input(s): CKTOTAL, CKMB, CKMBINDEX, TROPONINI in the last 168 hours.  BNP: BNP (last 3 results) Recent Labs    08/20/20 0449 08/22/20 0312  BNP 1,079.7* 726.1*    ProBNP (last 3 results) No results for input(s): PROBNP in the last 8760 hours.   CBG: Recent Labs  Lab 08/21/20 0603 08/21/20 1220 08/21/20 1602 08/21/20 2144 08/22/20 0348  GLUCAP 75 299* 208* 245* 189*    Coagulation Studies: Recent Labs    08/20/20 0449  LABPROT 13.5  INR 1.0     Imaging   CARDIAC CATHETERIZATION  Result Date: 08/22/2020 LM: Minimal disease LAD: Prox-mid long 80% stenosis Lcx: Lcx/OM1 95% ISR RCA: Prox 80% ISR, followed by In-stent CTO mid-distal vessel Left-to-right collaterals to distal vessel Mildly decompensated ischemic cardiomyopathy Temp pacer placed through [facility] consult for CABG     Medications:     Current Medications:  [START ON 08/23/2020] aspirin EC  81 mg Oral Daily   dapagliflozin propanediol  10 mg Oral Daily   docusate sodium  100 mg Oral BID   [START ON 08/23/2020] feeding supplement (GLUCERNA SHAKE)  237 mL Oral TID BM    gabapentin  100 mg Oral TID   insulin aspart  0-15 Units Subcutaneous TID WC   insulin aspart  0-5 Units Subcutaneous QHS   insulin glargine  10 Units Subcutaneous Daily   [START ON 08/23/2020]  multivitamin with minerals  1 tablet Oral Daily   potassium chloride  40 mEq Oral Once   rosuvastatin  40 mg Oral QHS   sodium chloride flush  3 mL Intravenous Q12H   sodium chloride flush  3 mL Intravenous Q12H    Infusions:  heparin 1,100 Units/hr (08/22/20 1856)   nitroGLYCERIN Stopped (08/22/20 1213)      Patient Profile    Mr Ginger is a 70 year old non english speaking ( [place]) with a history of  HTN, hyperlipidemia, DM, smoker, CAD with prior PCI to LCX and RCA with overlapping DES to RCA.   Presented to [facility] with chest pain.Cath with multivessel disease. Had 5 second pause prior to cath.    Assessment/Plan   NSTEMI --> multivessel CAD  -2012 had PCI with DES RCA -Had cath today with multivessel disease. CT surgery consulted for possible CABG - Add imdur 30 mg daily.   2. Acute Systolic Heart Failure , ICM  BNP > 1000. Initially diuresed with IV lasix.  -Echo this admit EF 30-35%  -RHC with preserved hemodynamics.  -Currently not on drips.  - Volume status now stable.   - Add imdur 30 mg daily.  - Avoid BB for now with pause.   3.DMII, uncontrolled  Hgb A1C 11 - on SSI  4. Hyperlipidemia  5. Sinus Pause  -5 second pause today.  -Had temp pacer placed today in the cath lab. Avoid BB for now.   6. AKI  Creatinine on admit 1.2--> today 1.5  Follow bmet daily.   7. Subclavian Stenosis Noted on CT  8. Tobacco Abuse   Length of Stay: 2  Amy Clegg, NP  08/22/2020, 1:33 PM  Advanced Heart Failure Team Pager [phone] (M-F; 7a - 5p)  Please contact [facility] for night-coverage after hours (4p -7a ) and weekends on [email or web address]  Patient seen with NP, agree with the above note.   70 y.o. with history of CAD, active smoker, DM2 was referred by Dr.  Kipp Brood for HF evaluation pre-CABG.  Patient had stents to the LCx and RCA in 2012, was followed by Dr. Acie Fredrickson but has not been seen by outpatient cardiology for a number of years now.  He was admitted with chest pain, found to have NSTEMI with HS-TnI up to 801.  He was noted today to have a 5 second pause on telemetry. He was taken for cath by Dr. Rhodia Albright today.  TTVP was placed and patient was referred to Dr. Kipp Brood for CABG.   I reviewed his echo, EF 30-35% with diffuse hypokinesis.  All wall segments appear to have some movement, no thinning or complete akinesis.  LHC showed 3 vessel disease with occluded RCA with collaterals, 80% long proximal LAD stenosis, 90% stenosis proximal LCx and OM1 (relatively small caliber).   Patient interviewed with his son as interpreter as we do not have an interpreter for his dialect.  He denies chest pain or dyspnea currently.  He has not had any pacing since he came to the [place] [facility].   General: NAD Neck: No JVD, no thyromegaly or thyroid nodule.  Lungs: Clear to auscultation bilaterally with normal respiratory effort. CV: Nondisplaced PMI.  Heart regular S1/S2, no S3/S4, no murmur.  No peripheral edema.  No carotid bruit.  Difficult to palpate pedal pulses.  Abdomen: Soft, nontender, no hepatosplenomegaly, no distention.  Skin: Intact without lesions or rashes.  Neurologic: Alert and oriented x 3.  Psych: Normal affect. Extremities: No clubbing or  cyanosis.  HEENT: Normal.   1. CAD:  Remote PCI to LCx and RCA.  NSTEMI this admission, found to have 3VD with occluded RCA with collaterals, 80% long proximal LAD stenosis, 90% stenosis proximal LCx and OM1 (relatively small caliber).  He is a diabetic with low EF.  Echo shows diffuse moderate hypokinesis, there is no thinning or akinesis, so suspect significant viability.  - I think that CABG is going to be the best course here.  I do not think that an MRI viability study is necessary.  Timing per TCTS  ([place] to see).  - Add Imdur for anginal prophylaxis.  Will hold off on beta blocker for now given long pause (he has a temporary transvenous pacer, but think would hold off on nodal blockade for now).  2. Acute systolic CHF: Echo with EF 30-35%, normal RV, moderate MR.  Ischemic cardiomyopathy.  He is not volume overloaded by exam and RHC today showed near-normal filling pressures with preserved cardiac output.  - Would hold off on ARB/ARNI immediately pre-CABG, would like to get on Entresto afterwards.  - Hold off on spironolactone with rise in creatinine over the last couple of days (1.49 today but initial creatinine 1.19).  - Hold off on beta blocker for now with long sinus pause.  - He can continue dapagliflozin.  3. Sinus pause: 5 second pause, now with TTVP.  Rhythm appears stable currently.   As above, plan for CABG.  Will have pacing wires with CABG and can remove TTVP at that time. Will need to follow closely, may require PPM but will see.  4. Type 2 diabetes: Insulin.  5. 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  Also with ulcerated plaque in infrarenal aorta.  - Will need pre-CABG dopplers.  6. Smoking: Urged him to quit.   Loralie Champagne 08/22/2020 2:51 PM

## 2020-08-22 NOTE — Progress Notes (Signed)
70 year old [name] speaking patient with uncontrolled type 2 diabetes mellitus, mixed hyperlipidemia, tobacco dependence, coronary artery disease with prior MI, LCX and RCA stents in 2012, now admitted with chest pain.  Work-up showed EKG with old anterior infarct, dynamic inferolateral ST depression and T wave inversions, high sensitive troponin at 900, echocardiogram with EF 35-40% with LAD territory severe hypokinesis.  He now also has had at least 1 symptomatic sinus pause of 5.2 seconds.  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  We will plan to perform left and right heart catheterization with left radial artery access, and place right IJ temporary pacemaker. Should he require CABG, right radial artery could still be used as a conduit.  Discussed with electrophysiologist Dr. Ladona Ridgel.  Should he have moderate nonobstructive disease, would prefer to place permanent pacemaker before committing him to antiplatelet therapy.  Should he have severe coronary artery disease, will proceed with revascularization as necessary.  Information was translated to the patient by his son [name].   [name], MD Pager: [phone] Office: [phone]

## 2020-08-22 NOTE — Interval H&P Note (Signed)
History and Physical Interval Note:  08/22/2020 10:51 AM  Colin [name]  has presented today for surgery, with the diagnosis of NSTEMI.  The various methods of treatment have been discussed with the patient and family. After consideration of risks, benefits and other options for treatment, the patient has consented to  Procedure(s): RIGHT/LEFT HEART CATH AND CORONARY ANGIOGRAPHY (N/A) TEMPORARY PACEMAKER (N/A) as a surgical intervention.  The patient's history has been reviewed, patient examined, no change in status, stable for surgery.  I have reviewed the patient's chart and labs.  Questions were answered to the patient's satisfaction.    2016 Appropriate Use Criteria for Coronary Revascularization in Patients With Acute Coronary Syndrome NSTEMI/Unstable angina, stabilized patient at high risk Indication:  Revascularization by PCI or CABG of 1 or more arteries in a patient with NSTEMI or unstable angina with Stabilization after presentation High risk for clinical events A (7) Indication: 16; Score 7   Brandon Wiechman J Letta Cargile

## 2020-08-22 NOTE — Consult Note (Addendum)
[address]       [place] [address]             [phone]        Avyn M Shed Pilger Medical Record #[id number] Date of Birth: [birth year]  Referring: No ref. provider found Primary Care: Kerin Perna, NP Primary Cardiologist:None  Chief Complaint:    Chief Complaint  Patient presents with   Chest Pain   Shortness of Breath    History of Present Illness:    We are asked to see this 70 year old male in cardiac surgical consultation for consideration of coronary artery surgical revascularization.  The patient presented to the emergency department on 08/20/2020 with initial complaints of chest pain and shortness of breath.  The patient is from [place] and speaks limited [place].  He is from [place].  He has multiple cardiac risk factors including hypertension, tobacco abse, hyperlipidemia and diabetes mellitus.  The pain began approximately 1 day prior to presentation and was associated with weakness and shortness of breath.  It is noted to be somewhat worse with exertion.  In the emergency department he was noted to have a elevated troponin.  Initial high sensitive troponin was 208 with last repeated value on 08/20/2020 970.  Thus he ruled in for non-STEMI.  He was also noted to have a significantly elevated beta natruretic peptide which peaked at 1079.  Reportedly the patient had a recent endocrinology appointment at which time he was told to stop all diabetes medications except Lantus however he stopped all his medications including blood pressure medications. Has findings were consistent with ACS with new CHF felt likely to be ischemic . Cardiology consultation was obtained and he was admitted for further evaluation and treatment.  Initial EKG did show sinus rhythm with nonspecific ST changes felt to be concerning for lateral ischemia.  He has began on heparin and nitroglycerin drips in the emergency department and was additionally ordered  beta-blocker by cardiology.  His initial renal function was in the normal range but he did evidence acute kidney insufficiency and most recent creatinine on today's date is 1.49 with a BUN of 32.  His most recent hemoglobin A1c dated 08/04/2020 shows poor control of his diabetes with a value of 10.7.  He had evaluated in April of this year also of 13.0.  Echocardiogram performed this hospitalization reveals reduced left ventricular ejection fraction by estimation at 30 to 35%.  Please see the full report listed below.  Cardiac catheterization performed today shows evidence of severe three-vessel coronary artery disease including in-stent restenosis in the circumflex and right coronary artery distributions.  Please see the full report below.  Note-was unable to obtain an interpreter at time of evaluation but did speak with son who speaks [place].  He will assist in discussing procedure with the patient but he and other family members feel as though we should proceed with surgery if that is our recommendation.  Current Activity/ Functional Status: Patient is independent with mobility/ambulation, transfers, ADL's, IADL's.   Zubrod Score: At the time of surgery this patient's most appropriate activity status/level should be described as: []     0    Normal activity, no symptoms []     1    Restricted in physical strenuous activity but ambulatory, able to do out light work [x]     2    Ambulatory and capable of self care, unable to do work activities, up and about  more than 50%  Of the time                            []     3    Only limited self care, in bed greater than 50% of waking hours []     4    Completely disabled, no self care, confined to bed or chair []     5    Moribund  Past Medical History:  Diagnosis Date   Arrhythmia    CAD (coronary artery disease)    stent x 2   Carotid artery occlusion    Diabetes mellitus    Dyslipidemia    Essential hypertension     Past Surgical  History:  Procedure Laterality Date   CARDIAC CATHETERIZATION     01/2010 ; 04/2010    Social History   Tobacco Use  Smoking Status Every Day   Packs/day: 0.25   Types: Cigarettes  Smokeless Tobacco Never    Social History   Substance and Sexual Activity  Alcohol Use No     No Known Allergies  Current Facility-Administered Medications  Medication Dose Route Frequency Provider Last Rate Last Admin   0.9 %  sodium chloride infusion  250 mL Intravenous PRN Tolia, Sunit, DO       0.9 %  sodium chloride infusion   Intravenous Continuous Patwardhan, Manish J, MD 10 mL/hr at 08/22/20 1148 10 mL/hr at 08/22/20 1148   0.9 %  sodium chloride infusion    Continuous PRN Patwardhan, Manish J, MD 10 mL/hr at 08/22/20 1241 10 mL/hr at 08/22/20 1241   [MAR Hold] acetaminophen (TYLENOL) tablet 650 mg  650 mg Oral Q6H PRN Karmen Bongo, MD       Or   [facility] Hold] acetaminophen (TYLENOL) suppository 650 mg  650 mg Rectal Q6H PRN Karmen Bongo, MD       [MAR Hold] aspirin EC tablet 81 mg  81 mg Oral Daily Arrien, Jimmy Picket, MD       [facility] Hold] bisacodyl (DULCOLAX) EC tablet 5 mg  5 mg Oral Daily PRN Karmen Bongo, MD       [facility] Hold] dapagliflozin propanediol (FARXIGA) tablet 10 mg  10 mg Oral Daily Tolia, Sunit, DO   10 mg at 08/21/20 0856   [MAR Hold] docusate sodium (COLACE) capsule 100 mg  100 mg Oral BID Karmen Bongo, MD   100 mg at 08/21/20 2152   [START ON 08/23/2020] feeding supplement (GLUCERNA SHAKE) (GLUCERNA SHAKE) liquid 237 mL  237 mL Oral TID BM Arrien, Jimmy Picket, MD       fentaNYL (SUBLIMAZE) injection    PRN Nigel Mormon, MD   25 mcg at 08/22/20 1203   [MAR Hold] gabapentin (NEURONTIN) capsule 100 mg  100 mg Oral TID Karmen Bongo, MD   100 mg at 08/21/20 2152   Heparin (Porcine) in NaCl 1000-0.9 UT/500ML-% SOLN    PRN Patwardhan, Manish J, MD   500 mL at 08/22/20 1114   heparin ADULT infusion 100 units/mL (25000 units/210m)  1,100 Units/hr Intravenous  Continuous AFranky Macho RPH 11 mL/hr at 08/22/20 0625 1,100 Units/hr at 08/22/20 0625   heparin sodium (porcine) injection    PRN PNigel Mormon MD   3,000 Units at 08/22/20 1212   [MAR Hold] hydrALAZINE (APRESOLINE) injection 5 mg  5 mg Intravenous Q4H PRN YKarmen Bongo MD       [facility] insulin aspart (novoLOG) injection 0-15  Units  0-15 Units Subcutaneous TID WC Karmen Bongo, MD   8 Units at 08/21/20 1647   [MAR Hold] insulin aspart (novoLOG) injection 0-5 Units  0-5 Units Subcutaneous QHS Karmen Bongo, MD   2 Units at 08/21/20 2152   insulin glargine (LANTUS) injection 10 Units  10 Units Subcutaneous Daily Arrien, Jimmy Picket, MD       iohexol (OMNIPAQUE) 350 MG/ML injection    PRN Patwardhan, Reynold Bowen, MD   35 mL at 08/22/20 1238   lidocaine (PF) (XYLOCAINE) 1 % injection    PRN Nigel Mormon, MD   2 mL at 08/22/20 1202   midazolam (VERSED) injection    PRN Nigel Mormon, MD   1 mg at 08/22/20 1121   [MAR Hold] morphine 2 MG/ML injection 2 mg  2 mg Intravenous Q2H PRN Karmen Bongo, MD       [START ON 08/23/2020] multivitamin with minerals tablet 1 tablet  1 tablet Oral Daily Arrien, Jimmy Picket, MD       nitroGLYCERIN 50 mg in dextrose 5 % 250 mL (0.2 mg/mL) infusion  0-200 mcg/min Intravenous Continuous Karmen Bongo, MD   Stopped at 08/22/20 1213   [MAR Hold] ondansetron (ZOFRAN) tablet 4 mg  4 mg Oral Q6H PRN Karmen Bongo, MD       Or   Doug [facility] Hold] ondansetron [facility]) injection 4 mg  4 mg Intravenous Q6H PRN Karmen Bongo, MD       [facility] Hold] oxyCODONE (Oxy IR/ROXICODONE) immediate release tablet 5 mg  5 mg Oral Q4H PRN Karmen Bongo, MD       [facility] Hold] polyethylene glycol (MIRALAX / GLYCOLAX) packet 17 g  17 g Oral Daily PRN Karmen Bongo, MD       potassium chloride SA (KLOR-CON) CR tablet 40 mEq  40 mEq Oral Once Arrien, Jimmy Picket, MD       Radial Cocktail/Verapamil only    PRN Nigel Mormon, MD   10 mL at 08/22/20  1210   Radial Cocktail/Verapamil only    PRN Patwardhan, Reynold Bowen, MD   10 mL at 08/22/20 1221   [MAR Hold] rosuvastatin (CRESTOR) tablet 40 mg  40 mg Oral QHS Tolia, Sunit, DO   40 mg at 08/21/20 2152   [facility] Hold] sodium chloride flush (NS) 0.9 % injection 3 mL  3 mL Intravenous Q12H Karmen Bongo, MD   3 mL at 08/22/20 1019   [MAR Hold] sodium chloride flush (NS) 0.9 % injection 3 mL  3 mL Intravenous Q12H Tolia, Sunit, DO   3 mL at 08/22/20 1018   sodium chloride flush (NS) 0.9 % injection 3 mL  3 mL Intravenous PRN Tolia, Sunit, DO       [MAR Hold] traZODone (DESYREL) tablet 25 mg  25 mg Oral QHS PRN Karmen Bongo, MD        Medications Prior to Admission  Medication Sig Dispense Refill Last Dose   insulin glargine (LANTUS) 100 UNIT/ML Solostar Pen Inject 60 Units into the skin every morning. (Patient taking differently: Inject 30 Units into the skin every morning.) 60 mL 3 08/18/2020 at am   Accu-Chek Softclix Lancets lancets Use to check blood sugar three times daily. 100 each 6    aspirin 81 MG tablet Take 81 mg by mouth daily. (Patient not taking: Reported on 08/20/2020)   Not Taking   Blood Glucose Monitoring Suppl (ACCU-CHEK GUIDE) w/Device KIT USE AS DIRECTED 1 kit 0    gabapentin (NEURONTIN) 100 MG  capsule TAKE 1 CAPSULE (100 MG TOTAL) BY MOUTH 3 (THREE) TIMES DAILY. (Patient not taking: No sig reported) 270 capsule 1 Not Taking   glucose blood (ACCU-CHEK GUIDE) test strip 1 each by Other route 2 (two) times daily. And lancets 2/day 100 each 6    Insulin Pen Needle (TRUEPLUS PEN NEEDLES) 32G X 4 MM MISC Use as instructed to inject Lantus and Victoza once daily. 100 each 0    losartan-hydrochlorothiazide (HYZAAR) 100-25 MG tablet TAKE 1 TABLET BY MOUTH DAILY. (Patient not taking: No sig reported) 90 tablet 0 Not Taking   rosuvastatin (CRESTOR) 20 MG tablet TAKE 1 TABLET (20 MG TOTAL) BY MOUTH DAILY. (Patient not taking: No sig reported) 90 tablet 1 Not Taking    Family History   Problem Relation Age of Onset   Diabetes Neg Hx      Review of Systems: AS per HPI  Review of Systems  Constitutional:  Positive for malaise/fatigue.  Gastrointestinal:  Positive for heartburn.  All other systems reviewed and are negative.        Physical Exam: BP 104/68   Pulse (!) 23   Temp 98.3 F (36.8 C) (Oral)   Resp (!) 6   Ht 5' 4"  (1.626 m)   Wt 57.7 kg   SpO2 (!) 0%   BMI 21.85 kg/m    Physical Exam Vitals reviewed.  Constitutional:      General: He is not in acute distress.    Appearance: He is well-developed and normal weight. He is not ill-appearing, toxic-appearing or diaphoretic.  HENT:     Head: Normocephalic and atraumatic.     Mouth/Throat:     Mouth: Mucous membranes are moist.     Pharynx: No pharyngeal swelling or oropharyngeal exudate.  Eyes:     Extraocular Movements: Extraocular movements intact.     Pupils: Pupils are equal, round, and reactive to light.  Neck:     Thyroid: No thyromegaly.     Vascular: No hepatojugular reflux or JVD.     Trachea: No tracheal deviation.  Cardiovascular:     Rate and Rhythm: Normal rate and regular rhythm. No extrasystoles are present.    Heart sounds: No murmur heard.   No friction rub. No gallop.     Comments: Absent right PT, O/W pulses intact, no carotid bruits Temp pacer placed in cath lab Pulmonary:     Effort: Pulmonary effort is normal.     Breath sounds: Normal breath sounds. No decreased breath sounds, wheezing or rhonchi.  Chest:     Chest wall: No mass, deformity, tenderness or edema.  Abdominal:     General: Bowel sounds are normal.     Palpations: Abdomen is soft. There is no hepatomegaly or mass.     Tenderness: There is no guarding or rebound.  Musculoskeletal:     Right lower leg: No tenderness. No edema.     Left lower leg: No tenderness. No edema.  Lymphadenopathy:     Cervical: No cervical adenopathy.  Skin:    General: Skin is warm and dry.     Capillary Refill:  Capillary refill takes less than 2 seconds.     Coloration: Skin is not cyanotic or pale.     Findings: No ecchymosis, erythema or rash.     Nails: There is no clubbing.  Neurological:     General: No focal deficit present.     Mental Status: He is alert.  Psychiatric:  Mood and Affect: Mood normal.        Behavior: Behavior normal.     Diagnostic Studies & Laboratory data:     Recent Radiology Findings:   Chest CT:  IMPRESSION: 1. Severe atherosclerosis of the thoracoabdominal aorta, including multiple ulcerated plaques in the infrarenal abdominal aorta. However, there is no evidence of thoracic aortic aneurysm or dissection. 2. 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 3. Mild stenosis of the infrarenal abdominal aorta and moderate stenosis at the ostium of the inferior mesenteric artery. 4. Cardiomegaly with left ventricular dilatation. There is also evidence of interstitial pulmonary edema, along with moderate right and small left pleural effusions; imaging findings concerning for congestive heart failure. 5. Left main and 3 vessel coronary artery disease. Please note that although the presence of coronary artery calcium documents the presence of coronary artery disease, the severity of this disease and any potential stenosis cannot be assessed on this non-gated CT examination. Assessment for potential risk factor modification, dietary therapy or pharmacologic therapy may be warranted, if clinically indicated. 6. There are calcifications of the aortic valve. Echocardiographic correlation for evaluation of potential valvular dysfunction may be warranted if clinically indicated. 7. Additional incidental findings, as above. ECHOCARDIOGRAM REPORT        Echocardiogram: Patient Name:   JAMION CARTER [facility] Date of Exam: 08/20/2020  Medical Rec #:  [id number]    Height:       64.0 in  Accession #:    [id number]    Weight:       143.3 lb  Date of Birth:  1950/07/28    BSA:          1.698 m  Patient Age:    34 years     BP:           104/67 mmHg  Patient Gender: M            HR:           82 bpm.  Exam Location:  Inpatient   Procedure: 2D Echo   Indications:     chest pain     History:         Patient has no prior history of Echocardiogram  examinations.                   CAD; Risk Factors:Diabetes, Hypertension and  Dyslipidemia.     Sonographer:     Johny Chess RDCS  Referring Phys:  [id number] KDXIP TOLIA  Diagnosing Phys: Rex Kras DO   IMPRESSIONS     1. Left ventricular ejection fraction, by estimation, is 30 to 35%. The  left ventricle has moderate to severely decreased function. The left  ventricle demonstrates global hypokinesis. The left ventricular internal  cavity size was mildly dilated. Left  ventricular diastolic parameters are consistent with Grade II diastolic  dysfunction (pseudonormalization). Elevated left atrial pressure.   2. Right ventricular systolic function is mildly reduced. The right  ventricular size is normal.   3. Left atrial size was mildly dilated.   4. The mitral valve is degenerative. Moderate mitral valve regurgitation.  No evidence of mitral stenosis.   5. The aortic valve is tricuspid. Aortic valve regurgitation is not  visualized. Mild aortic valve sclerosis is present, with no evidence of  aortic valve stenosis.   6. The inferior vena cava is normal in size with greater than 50%  respiratory variability, suggesting right atrial pressure of  3 mmHg.   7. Evidence of atrial level shunting detected by color flow Doppler.   FINDINGS   Left Ventricle: Left ventricular ejection fraction, by estimation, is 30  to 35%. The left ventricle has moderate to severely decreased function.  The left ventricle demonstrates global hypokinesis. The left ventricular  internal cavity size was mildly  dilated. There is no left ventricular hypertrophy. Left  ventricular  diastolic parameters are consistent with Grade II diastolic dysfunction  (pseudonormalization). Elevated left atrial pressure.      LV Wall Scoring:  The anterior septum, inferior wall, mid inferoseptal segment, and basal  inferoseptal segment are hypokinetic.   Right Ventricle: The right ventricular size is normal. No increase in  right ventricular wall thickness. Right ventricular systolic function is  mildly reduced.   Left Atrium: Left atrial size was mildly dilated.   Right Atrium: Right atrial size was normal in size.   Pericardium: There is no evidence of pericardial effusion.   Mitral Valve: The mitral valve is degenerative in appearance. There is  mild thickening of the mitral valve leaflet(s). Normal mobility of the  mitral valve leaflets. Moderate mitral valve regurgitation. No evidence of  mitral valve stenosis.   Tricuspid Valve: The tricuspid valve is normal in structure. Tricuspid  valve regurgitation is trivial. No evidence of tricuspid stenosis.   Aortic Valve: The aortic valve is tricuspid. Aortic valve regurgitation is  not visualized. Mild aortic valve sclerosis is present, with no evidence  of aortic valve stenosis.   Pulmonic Valve: The pulmonic valve was grossly normal. Pulmonic valve  regurgitation is not visualized. No evidence of pulmonic stenosis.   Aorta: The aortic root and ascending aorta are structurally normal, with  no evidence of dilitation.   Venous: The inferior vena cava is normal in size with greater than 50%  respiratory variability, suggesting right atrial pressure of 3 mmHg.   IAS/Shunts: Evidence of atrial level shunting detected by color flow  Doppler.      LEFT VENTRICLE  PLAX 2D  LVIDd:         5.20 cm      Diastology  LVIDs:         4.60 cm      LV e' medial:    4.35 cm/s  LV PW:         1.10 cm      LV E/e' medial:  22.5  LV IVS:        1.00 cm      LV e' lateral:   9.03 cm/s  LVOT diam:     1.90 cm      LV  E/e' lateral: 10.8  LV SV:         31  LV SV Index:   18  LVOT Area:     2.84 cm     LV Volumes (MOD)  LV vol d, MOD A2C: 120.0 ml  LV vol d, MOD A4C: 107.0 ml  LV vol s, MOD A2C: 79.8 ml  LV vol s, MOD A4C: 76.9 ml  LV SV MOD A2C:     40.2 ml  LV SV MOD A4C:     107.0 ml  LV SV MOD BP:      36.1 ml   RIGHT VENTRICLE             IVC  RV S prime:     10.00 cm/s  IVC diam: 1.70 cm  TAPSE (M-mode): 1.4 cm   LEFT ATRIUM  Index       RIGHT ATRIUM           Index  LA diam:        3.90 cm 2.30 cm/m  RA Area:     11.30 cm  LA Vol (A2C):   66.6 ml 39.22 ml/m RA Volume:   24.10 ml  14.19 ml/m  LA Vol (A4C):   50.8 ml 29.92 ml/m  LA Biplane Vol: 58.4 ml 34.40 ml/m   AORTIC VALVE  LVOT Vmax:   62.60 cm/s  LVOT Vmean:  39.400 cm/s  LVOT VTI:    0.109 m     AORTA  Ao Root diam: 2.60 cm  Ao Asc diam:  3.10 cm   MITRAL VALVE  MV Area (PHT): 5.97 cm      SHUNTS  MV Decel Time: 127 msec      Systemic VTI:  0.11 m  MR Peak grad:    106.1 mmHg  Systemic Diam: 1.90 cm  MR Mean grad:    63.0 mmHg  MR Vmax:         515.00 cm/s  MR Vmean:        373.0 cm/s  MR PISA:         1.27 cm  MR PISA Eff ROA: 10 mm  MR PISA Radius:  0.45 cm  MV E velocity: 97.70 cm/s  MV A velocity: 75.40 cm/s  MV E/A ratio:  1.30   Sunit Tolia DO  Electronically signed by Rex Kras DO  Signature Date/Time: 08/20/2020/12:16:39 PM         Final    RIGHT/LEFT HEART CATH AND CORONARY ANGIOGRAPHY  TEMPORARY PACEMAKER    Conclusion  LM: Minimal disease LAD: Prox-mid long 80% stenosis Lcx: Lcx/OM1 95% ISR RCA: Prox 80% ISR, followed by In-stent CTO mid-distal vessel Left-to-right collaterals to distal vessel Mildly decompensated ischemic cardiomyopathy Temp pacer placed through [facility]   Surgical consult for CABG    Procedural Details  Technical Details Procedures: 1. Ultrasound guided left ulnar artery access 2. Temporary pacemaker placement RIJ 3. Right heart  catheterization 4. Left heart catheterization 5. Selective left and right coronary angiography 6. Conscious sedation monitoring 73 min  Indication: NSTEMI HFrEF Symptomatic sinus pause  History: 70 year old Montagn[place] speaking patient, CAD with prior MI and stents to RCA and circumflex,  uncontrolled type 2 diabetes mellitus, except lipidemia, tobacco dependence, admitted with non-STEMI, HFrEF, also had 5.2 sec symptomatic sinus pause  Left ulnar artery was accessed as left radial artery was too small.  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  Diagnostic Angiography: Catheter/s advances over guidewire under fluoroscopy Left coronary artery: 5 Fr JL 3.5  Right coronary artery: 5 Fr 3DRC Left heart catheterization: 5 Fr JR 4   Pressures tracings obtained in right atrium, right ventricle, pulmonary artery, and pulmonary capillary wedge position.   Anticoagulation:  3000 units heparin  Hemostasis: TR band RIJ sheath sutured in place  Total contrast used: 35 cc   Total fluoro time: 8.2 min Air Kerma: 186 mGy  All wires and catheters removed out of the body at the end of the procedure Final angiogram showed no dissection/perforation           Estimated blood loss <50 mL.   During this procedure medications were administered to achieve and maintain moderate conscious sedation while the patient's heart rate, blood pressure, and oxygen saturation were continuously monitored and I was present face-to-face 100%  of this time.   Medications (Filter: Administrations occurring from 1053 to 1326 on 08/22/20)  important  Continuous medications are totaled by the amount administered until 08/22/20 1326.    Heparin (Porcine) in NaCl 1000-0.9 UT/500ML-% SOLN (mL) Total volume:  1,000 mL  Date/Time Rate/Dose/Volume Action   08/22/20 1114 500 mL Given   1114 500 mL Given    midazolam (VERSED)  injection (mg) Total dose:  1 mg  Date/Time Rate/Dose/Volume Action   08/22/20 1121 1 mg Given    fentaNYL (SUBLIMAZE) injection (mcg) Total dose:  50 mcg  Date/Time Rate/Dose/Volume Action   08/22/20 1121 25 mcg Given   1203 25 mcg Given    lidocaine (PF) (XYLOCAINE) 1 % injection (mL) Total volume:  14 mL  Date/Time Rate/Dose/Volume Action   08/22/20 1124 2 mL Given   1140 5 mL Given   1153 3 mL Given   1159 2 mL Given   1202 2 mL Given    Radial Cocktail/Verapamil only (mL) Total volume:  10 mL  Date/Time Rate/Dose/Volume Action   08/22/20 1210 10 mL Given    heparin sodium (porcine) injection (Units) Total dose:  3,000 Units  Date/Time Rate/Dose/Volume Action   08/22/20 1212 3,000 Units Given    Radial Cocktail/Verapamil only (mL) Total volume:  10 mL  Date/Time Rate/Dose/Volume Action   08/22/20 1221 10 mL Given    iohexol (OMNIPAQUE) 350 MG/ML injection (mL) Total volume:  35 mL  Date/Time Rate/Dose/Volume Action   08/22/20 1238 35 mL Given    0.9 %  sodium chloride infusion (mL/hr) Total dose:  Cannot be calculated* Dosing weight:  57.7  *Continuous medication not stopped within the calculation time range. Date/Time Rate/Dose/Volume Action   08/22/20 1241 10 mL/hr New Bag/Given    aspirin EC tablet 81 mg (mg) Total dose:  Cannot be calculated* Dosing weight:  57.7  *Administration dose not documented Date/Time Rate/Dose/Volume Action   08/22/20 1053 *Not included in total MAR Hold    bisacodyl (DULCOLAX) EC tablet 5 mg (mg) Total dose:  Cannot be calculated* Dosing weight:  65  *Administration dose not documented Date/Time Rate/Dose/Volume Action   08/22/20 1053 *Not included in total MAR Hold    dapagliflozin propanediol (FARXIGA) tablet 10 mg (mg) Total dose:  Cannot be calculated* Dosing weight:  65  *Administration dose not documented Date/Time Rate/Dose/Volume Action   08/22/20 1053 *Not included in total MAR Hold    docusate  sodium (COLACE) capsule 100 mg (mg) Total dose:  Cannot be calculated* Dosing weight:  65  *Administration dose not documented Date/Time Rate/Dose/Volume Action   08/22/20 1053 *Not included in total MAR Hold    gabapentin (NEURONTIN) capsule 100 mg (mg) Total dose:  Cannot be calculated*  *Administration dose not documented Date/Time Rate/Dose/Volume Action   08/22/20 1053 *Not included in total MAR Hold    hydrALAZINE (APRESOLINE) injection 5 mg (mg) Total dose:  Cannot be calculated* Dosing weight:  65  *Administration dose not documented Date/Time Rate/Dose/Volume Action   08/22/20 1053 *Not included in total MAR Hold    insulin aspart (novoLOG) injection 0-15 Units (Units) Total dose:  Cannot be calculated* Dosing weight:  65  *Administration dose not documented Date/Time Rate/Dose/Volume Action   08/22/20 1053 *Not included in total MAR Hold    insulin aspart (novoLOG) injection 0-5 Units (Units) Total dose:  Cannot be calculated* Dosing weight:  65  *Administration dose not documented Date/Time Rate/Dose/Volume Action   08/22/20 1053 *Not included in total MAR Hold  morphine 2 MG/ML injection 2 mg (mg) Total dose:  Cannot be calculated* Dosing weight:  65  *Administration dose not documented Date/Time Rate/Dose/Volume Action   08/22/20 1053 *Not included in total MAR Hold    nitroGLYCERIN 50 mg in dextrose 5 % 250 mL (0.2 mg/mL) infusion (mcg/min) Total dose:  40 mcg Dosing weight:  65  Date/Time Rate/Dose/Volume Action   08/22/20 1205 5 mcg/min - 1.5 mL/hr Rate/Dose Change   1213  Stopped    oxyCODONE (Oxy IR/ROXICODONE) immediate release tablet 5 mg (mg) Total dose:  Cannot be calculated* Dosing weight:  65  *Administration dose not documented Date/Time Rate/Dose/Volume Action   08/22/20 1053 *Not included in total MAR Hold    polyethylene glycol (MIRALAX / GLYCOLAX) packet 17 g (g) Total dose:  Cannot be calculated* Dosing weight:   65  *Administration dose not documented Date/Time Rate/Dose/Volume Action   08/22/20 1053 *Not included in total MAR Hold    rosuvastatin (CRESTOR) tablet 40 mg (mg) Total dose:  Cannot be calculated*  *Administration dose not documented Date/Time Rate/Dose/Volume Action   08/22/20 1053 *Not included in total MAR Hold    sodium chloride flush (NS) 0.9 % injection 3 mL (mL) Total dose:  Cannot be calculated* Dosing weight:  65  *Administration dose not documented Date/Time Rate/Dose/Volume Action   08/22/20 1053 *Not included in total MAR Hold    sodium chloride flush (NS) 0.9 % injection 3 mL (mL) Total dose:  Cannot be calculated* Dosing weight:  62.8  *Administration dose not documented Date/Time Rate/Dose/Volume Action   08/22/20 1053 *Not included in total MAR Hold    traZODone (DESYREL) tablet 25 mg (mg) Total dose:  Cannot be calculated* Dosing weight:  65  *Administration dose not documented Date/Time Rate/Dose/Volume Action   08/22/20 1053 *Not included in total MAR Hold    0.9 %  sodium chloride infusion (mL/hr) Total volume:  249.46 mL Dosing weight:  57.7  Date/Time Rate/Dose/Volume Action   08/22/20 1134 999 mL/hr Rate/Dose Change   1148 10 mL/hr Rate/Dose Change    Sedation Time  Sedation Time Physician-1: 1 hour 13 minutes 21 seconds   Contrast  Medication Name Total Dose  iohexol (OMNIPAQUE) 350 MG/ML injection 35 mL    Radiation/Fluoro  Fluoro time: 8.2 (min) DAP: 10525 (mGycm2) Cumulative Air Kerma: 765 (mGy)   Complications   Complications documented before study signed (08/22/2020  4:65 PM)    No complications were associated with this study.  Documented by Nigel Mormon, MD - 08/22/2020 12:59 PM     Coronary Findings   Diagnostic Dominance: Right  Left Anterior Descending  Prox LAD to Mid LAD lesion is 80% stenosed.  Left Circumflex  Prox Cx lesion is 95% stenosed. The lesion was previously treated using a drug  eluting stent over 2 years ago.  First Obtuse Marginal Branch  1st Mrg lesion is 95% stenosed. The lesion was previously treated using a drug eluting stent over 2 years ago.  Right Coronary Artery  Collaterals  Dist RCA filled by collaterals from Dist LAD.    Prox RCA lesion is 80% stenosed.  Prox RCA to Dist RCA lesion is 100% stenosed. The lesion was previously treated using a drug eluting stent over 2 years ago. Previously placed stent displays restenosis.   Intervention   No interventions have been documented.  Right Heart  Right Atrium RA: 1 mmHg RV: 35/0 mmHg PA: 34/12 mmhg, mPAP 0 mmHg PCW: 14 mmHg LCVEDP 17 mmHg  CO: 5.9  L/min CI: 3.7 L/minm2   Temporary Pacemaker  Temporary Pacemaker A temporary transvenous catheter was placed in the right internal jugular vein. Symptomatic sinus pause 5.2 sec Catheter/lead tip was placed in the right ventricle apex. Settings:  Heart Rate:  50 bpm. Threshold:  1 mA. Output:  10 mA.   Left Heart  Left Ventricle LV end diastolic pressure is mildly elevated.   Coronary Diagrams   Diagnostic Dominance: Right    Intervention      I have independently reviewed the above radiologic studies and discussed with the patient   Recent Lab Findings: Lab Results  Component Value Date   WBC 7.2 08/22/2020   HGB 11.5 (L) 08/22/2020   HCT 34.5 (L) 08/22/2020   PLT 207 08/22/2020   GLUCOSE 209 (H) 08/22/2020   CHOL 187 08/21/2020   TRIG 53 08/21/2020   HDL 40 (L) 08/21/2020   LDLDIRECT 137.1 (H) 08/21/2020   LDLCALC 136 (H) 08/21/2020   ALT 23 12/17/2019   AST 12 12/17/2019   NA 141 08/22/2020   K 3.7 08/22/2020   CL 107 08/22/2020   CREATININE 1.49 (H) 08/22/2020   BUN 32 (H) 08/22/2020   CO2 27 08/22/2020   TSH 3.058 01/22/2010   INR 1.0 08/20/2020   HGBA1C 10.7 (A) 08/04/2020      Assessment / Plan: Severe three-vessel coronary artery disease in the setting of non-STEMI.  He has had multiple previous stents  placed.  Presented with acute systolic congestive failure felt to be ischemic.    Acute kidney injury Poorly controlled type 2 diabetes mellitus HTN Hyperlipidemia Sinus pause during cardiac catheterization now with temporary pacer wire in place Tobacco abuse Right Subclavian stenosis noted on CT scan Carotid disease per history- preop study pending  We have asked the advanced heart failure team to assist with preoperative management and they have assessed him as very stable at this point.  I  spent 40 minutes counseling the patient face to face.   John Giovanni, PA-C  08/22/2020 1:25 PM   Agree with above. This is a 70 year old gentleman that presents to the emergency department for chest pain and exertional dyspnea.  Labs are consistent for troponin leak.  He underwent a left heart cath which showed severe three-vessel coronary disease.  His vessels are quite small but distally there is not significant calcifications.  On review of his echocardiogram his LV function is decreased but his RV function appears preserved, additionally he has moderate mitral valve regurgitation which appears central.  There is no aortic valve disease.  He is scheduled for CABG on 08/23/2020.  Given his heart function and the appearance of the mitral valve regurgitation we would not repair this at the time of surgery.  Was likely ischemic and will likely improve.  Ryli Standlee Bary Leriche

## 2020-08-22 NOTE — Progress Notes (Addendum)
Progress Note  Patient Name: Colin [name] Date of Encounter: 08/22/2020  Attending physician: Coralie Keens Primary care provider: Grayce Sessions, NP Primary Cardiologist:  NA Consultant:Beckett Hickmon Odis Hollingshead, DO  Subjective: Colin [name] is a 70 y.o. male who was seen and examined at bedside ~830am. Complains of mild substernal chest pain, similar to when he came to ER.  Intensity 5/10 Currently on IV nitro and heparin gtt.  No shortness of breath, LE swelling, or orthopnea, PND   Objective: Vital Signs in the last 24 hours: Temp:  [98.3 F (36.8 C)-99.5 F (37.5 C)] 98.3 F (36.8 C) (07/18 0751) Pulse Rate:  [71] 71 (07/18 0751) Resp:  [13-19] 13 (07/18 0751) BP: (118-140)/(53-105) 122/80 (07/18 0751) SpO2:  [95 %-100 %] 95 % (07/18 0751) Weight:  [57.7 kg] 57.7 kg (07/18 0341)  Intake/Output:  Intake/Output Summary (Last 24 hours) at 08/22/2020 1000 Last data filed at 08/22/2020 0700 Gross per 24 hour  Intake 480.72 ml  Output 3700 ml  Net -3219.28 ml    Net IO Since Admission: -5,844.28 mL [08/22/20 1000]  Weights:  Filed Weights   08/20/20 0446 08/21/20 0454 08/22/20 0341  Weight: 65 kg 62.8 kg 57.7 kg    Telemetry: Personally reviewed. NSR w/ dysrhythmia   Physical examination: PHYSICAL EXAM: Vitals with BMI 08/22/2020 08/22/2020 08/22/2020  Height - - -  Weight - 127 lbs 5 oz -  BMI - 21.84 -  Systolic 122 133 295  Diastolic 80 75 53  Pulse 71 - -    CONSTITUTIONAL: Age-appropriate Asian descent male, resting in bed comfortably, hemodynamically stable, no acute distress.   SKIN: Skin is warm and dry. No rash noted. No cyanosis. No pallor. No jaundice HEAD: Normocephalic and atraumatic. EYES: No scleral icterus MOUTH/THROAT: Moist oral membranes. NECK: JVD present. No thyromegaly noted. No carotid bruits LYMPHATIC: No visible cervical adenopathy. CHEST Normal respiratory effort. No intercostal retractions LUNGS: Clear to auscultation in the  upper lung fields, Rales noted at the bases bilaterally, no expiratory wheezes.  CARDIOVASCULAR: Positive S1-S2, distant heart sounds, no murmurs rubs or gallops appreciated ABDOMINAL: Nonobese, soft, nontender, nondistended, positive bowel sounds in all 4 quadrants, no apparent ascites. EXTREMITIES: +1 bilateral peripheral edema, warm to touch, +2 dorsalis and posterior tibial pulses. HEMATOLOGIC: No significant bruising NEUROLOGIC: Oriented to person, place, and time. Nonfocal. Normal muscle tone. PSYCHIATRIC: Normal mood and affect. Normal behavior. Cooperative  Lab Results: Hematology Recent Labs  Lab 08/20/20 0449 08/21/20 0039 08/22/20 0312  WBC 10.5 8.5 7.2  RBC 4.27 4.20* 3.69*  HGB 13.4 13.0 11.5*  HCT 39.3 39.0 34.5*  MCV 92.0 92.9 93.5  MCH 31.4 31.0 31.2  MCHC 34.1 33.3 33.3  RDW 13.1 13.1 13.0  PLT 227 238 207    Chemistry Recent Labs  Lab 08/20/20 0449 08/21/20 0039 08/22/20 0312  NA 138 142 141  K 3.9 3.2* 3.7  CL 107 105 107  CO2 26 32 27  GLUCOSE 350* 38* 209*  BUN 17 19 32*  CREATININE 1.19 1.54* 1.49*  CALCIUM 9.6 9.5 8.7*  GFRNONAA >60 48* 50*  ANIONGAP Cardiac Enzymes: Cardiac Panel (last 3 results) Recent Labs    08/20/20 0628 08/20/20 1632 08/20/20 1911  TROPONINIHS 195* 801* 970*    BNP (last 3 results) Recent Labs    08/20/20 0449 08/22/20 0312  BNP 1,079.7* 726.1*    ProBNP (last 3 results) No results for input(s): PROBNP in the last 8760 hours.  DDimer No results for input(s): DDIMER in the last 168 hours.   Hemoglobin A1c:  Lab Results  Component Value Date   HGBA1C 10.7 (A) 08/04/2020    TSH No results for input(s): TSH in the last 8760 hours.  Lipid Panel  Lab Results  Component Value Date   CHOL 187 08/21/2020   HDL 40 (L) 08/21/2020   LDLCALC 136 (H) 08/21/2020   LDLDIRECT 137.1 (H) 08/21/2020   TRIG 53 08/21/2020   CHOLHDL 4.7 08/21/2020    Imaging: ECHOCARDIOGRAM COMPLETE  Result  Date: 08/20/2020    ECHOCARDIOGRAM REPORT   Patient Name:   Colin [name] [facility] Date of Exam: 08/20/2020 Medical Rec #:  [id number]    Height:       64.0 in Accession #:    [id number]   Weight:       143.3 lb Date of Birth:  [birth year]    BSA:          1.698 m Patient Age:    70 years     BP:           104/67 mmHg Patient Gender: M            HR:           82 bpm. Exam Location:  Inpatient Procedure: 2D Echo Indications:     chest pain  History:         Patient has no prior history of Echocardiogram examinations.                  CAD; Risk Factors:Diabetes, Hypertension and Dyslipidemia.  Sonographer:     [name] RDCS Referring Phys:  [id number] VOHYW Loeta Herst Diagnosing Phys: Tessa Lerner DO IMPRESSIONS  1. Left ventricular ejection fraction, by estimation, is 30 to 35%. The left ventricle has moderate to severely decreased function. The left ventricle demonstrates global hypokinesis. The left ventricular internal cavity size was mildly dilated. Left ventricular diastolic parameters are consistent with Grade II diastolic dysfunction (pseudonormalization). Elevated left atrial pressure.  2. Right ventricular systolic function is mildly reduced. The right ventricular size is normal.  3. Left atrial size was mildly dilated.  4. The mitral valve is degenerative. Moderate mitral valve regurgitation. No evidence of mitral stenosis.  5. The aortic valve is tricuspid. Aortic valve regurgitation is not visualized. Mild aortic valve sclerosis is present, with no evidence of aortic valve stenosis.  6. The inferior vena cava is normal in size with greater than 50% respiratory variability, suggesting right atrial pressure of 3 mmHg.  7. Evidence of atrial level shunting detected by color flow Doppler. FINDINGS  Left Ventricle: Left ventricular ejection fraction, by estimation, is 30 to 35%. The left ventricle has moderate to severely decreased function. The left ventricle demonstrates global hypokinesis. The left ventricular internal  cavity size was mildly dilated. There is no left ventricular hypertrophy. Left ventricular diastolic parameters are consistent with Grade II diastolic dysfunction (pseudonormalization). Elevated left atrial pressure.  LV Wall Scoring: The anterior septum, inferior wall, mid inferoseptal segment, and basal inferoseptal segment are hypokinetic. Right Ventricle: The right ventricular size is normal. No increase in right ventricular wall thickness. Right ventricular systolic function is mildly reduced. Left Atrium: Left atrial size was mildly dilated. Right Atrium: Right atrial size was normal in size. Pericardium: There is no evidence of pericardial effusion. Mitral Valve: The mitral valve is degenerative in appearance. There is mild thickening of the mitral valve leaflet(s). Normal mobility of the mitral valve leaflets. Moderate mitral  valve regurgitation. No evidence of mitral valve stenosis. Tricuspid Valve: The tricuspid valve is normal in structure. Tricuspid valve regurgitation is trivial. No evidence of tricuspid stenosis. Aortic Valve: The aortic valve is tricuspid. Aortic valve regurgitation is not visualized. Mild aortic valve sclerosis is present, with no evidence of aortic valve stenosis. Pulmonic Valve: The pulmonic valve was grossly normal. Pulmonic valve regurgitation is not visualized. No evidence of pulmonic stenosis. Aorta: The aortic root and ascending aorta are structurally normal, with no evidence of dilitation. Venous: The inferior vena cava is normal in size with greater than 50% respiratory variability, suggesting right atrial pressure of 3 mmHg. IAS/Shunts: Evidence of atrial level shunting detected by color flow Doppler.  LEFT VENTRICLE PLAX 2D LVIDd:         5.20 cm      Diastology LVIDs:         4.60 cm      LV e' medial:    4.35 cm/s LV PW:         1.10 cm      LV E/e' medial:  22.5 LV IVS:        1.00 cm      LV e' lateral:   9.03 cm/s LVOT diam:     1.90 cm      LV E/e' lateral: 10.8 LV  SV:         31 LV SV Index:   18 LVOT Area:     2.84 cm  LV Volumes (MOD) LV vol d, MOD A2C: 120.0 ml LV vol d, MOD A4C: 107.0 ml LV vol s, MOD A2C: 79.8 ml LV vol s, MOD A4C: 76.9 ml LV SV MOD A2C:     40.2 ml LV SV MOD A4C:     107.0 ml LV SV MOD BP:      36.1 ml RIGHT VENTRICLE             IVC RV S prime:     10.00 cm/s  IVC diam: 1.70 cm TAPSE (M-mode): 1.4 cm LEFT ATRIUM             Index       RIGHT ATRIUM           Index LA diam:        3.90 cm 2.30 cm/m  RA Area:     11.30 cm LA Vol (A2C):   66.6 ml 39.22 ml/m RA Volume:   24.10 ml  14.19 ml/m LA Vol (A4C):   50.8 ml 29.92 ml/m LA Biplane Vol: 58.4 ml 34.40 ml/m  AORTIC VALVE LVOT Vmax:   62.60 cm/s LVOT Vmean:  39.400 cm/s LVOT VTI:    0.109 m  AORTA Ao Root diam: 2.60 cm Ao Asc diam:  3.10 cm MITRAL VALVE MV Area (PHT): 5.97 cm      SHUNTS MV Decel Time: 127 msec      Systemic VTI:  0.11 m MR Peak grad:    106.1 mmHg  Systemic Diam: 1.90 cm MR Mean grad:    63.0 mmHg MR Vmax:         515.00 cm/s MR Vmean:        373.0 cm/s MR PISA:         1.27 cm MR PISA Eff ROA: 10 mm MR PISA Radius:  0.45 cm MV E velocity: 97.70 cm/s MV A velocity: 75.40 cm/s MV E/A ratio:  1.30 Marshal Eskew DO Electronically signed by Tessa Lerner DO Signature Date/Time: 08/20/2020/12:16:39 PM  Final     Cardiac database: EKG: 05/06/2020: Normal sinus rhythm, 65 bpm, left axis deviation, old anteroseptal infarct, without underlying injury pattern.   08/20/2020: Normal sinus rhythm, 93 bpm, old anteroseptal infarct, ST-T changes in the lateral leads suggestive of ischemia, without underlying injury pattern.  Echocardiogram: 08/20/2020: 1. Left ventricular ejection fraction, by estimation, is 30 to 35%. The left ventricle has moderate to severely decreased function. The left ventricle demonstrates global hypokinesis. The left ventricular internal cavity size was mildly dilated. Left ventricular diastolic parameters are consistent with Grade II diastolic dysfunction  (pseudonormalization). Elevated left atrial pressure.   2. Right ventricular systolic function is mildly reduced. The right ventricular size is normal.   3. Left atrial size was mildly dilated.   4. The mitral valve is degenerative. Moderate mitral valve regurgitation. No evidence of mitral stenosis.   5. The aortic valve is tricuspid. Aortic valve regurgitation is not visualized. Mild aortic valve sclerosis is present, with no evidence of aortic valve stenosis.   6. The inferior vena cava is normal in size with greater than 50% respiratory variability, suggesting right atrial pressure of 3 mmHg.   7. Evidence of atrial level shunting detected by color flow Doppler.  Heart catheterization: 04/21/2010 by Dr. Excell Seltzer:  1. Successful stenting of the right coronary artery with overlapping long drug-eluting stents. 2. Negative pressure wire analysis of the LAD  CTA Chest / Abd/ Pelvis 08/20/2020: 1. Severe atherosclerosis of the thoracoabdominal aorta, including multiple ulcerated plaques in the infrarenal abdominal aorta. However, there is no evidence of thoracic aortic aneurysm or dissection. 2. 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 3. Mild stenosis of the infrarenal abdominal aorta and moderate stenosis at the ostium of the inferior mesenteric artery. 4. Cardiomegaly with left ventricular dilatation. There is also evidence of interstitial pulmonary edema, along with moderate right and small left pleural effusions; imaging findings concerning for congestive heart failure. 5. Left main and 3 vessel coronary artery disease. Please note that although the presence of coronary artery calcium documents the presence of coronary artery disease, the severity of this disease and any potential stenosis cannot be assessed on this non-gated CT examination. Assessment for potential risk factor modification, dietary therapy or  pharmacologic therapy may be warranted, if clinically indicated. 6. There are calcifications of the aortic valve. Echocardiographic correlation for evaluation of potential valvular dysfunction may be warranted if clinically indicated. 7. Additional incidental findings, as above.  Scheduled Meds:  [START ON 08/23/2020] aspirin EC  81 mg Oral Daily   dapagliflozin propanediol  10 mg Oral Daily   docusate sodium  100 mg Oral BID   gabapentin  100 mg Oral TID   insulin aspart  0-15 Units Subcutaneous TID WC   insulin aspart  0-5 Units Subcutaneous QHS   rosuvastatin  40 mg Oral QHS   sodium chloride flush  3 mL Intravenous Q12H   sodium chloride flush  3 mL Intravenous Q12H    Continuous Infusions:  sodium chloride     sodium chloride 10 mL/hr at 08/22/20 3419   heparin 1,100 Units/hr (08/22/20 0625)   nitroGLYCERIN 2.5 mcg/min (08/22/20 0600)    PRN Meds: sodium chloride, acetaminophen **OR** acetaminophen, bisacodyl, hydrALAZINE, morphine injection, ondansetron **OR** ondansetron (ZOFRAN) IV, oxyCODONE, polyethylene glycol, sodium chloride flush, traZODone   IMPRESSION & RECOMMENDATIONS: TABB CROGHAN is a 70 y.o. male whose past medical history and cardiac risk factors include: History of non-STEMI, established CAD  w/ prior PCI to LCx and RCA, coronary artery calcification on non-gated CT study, IDDM Type 2, Hyperlipidemia, Smoking, Hypertension, Severe atherosclerosis of the thoracoabdominal aorta (CTA 08/20/2020).  Non-STEMI: Symptoms on presentation very suggestive of angina pectoris, high sensitive troponins peaked 970, and ECGs suggestive of ischemia. Prior history of non-STEMI status post PCI to the LCx and RCA. No longitudinal cardiovascular follow-up as outpatient despite extensive cardiac history. Currently on IV heparin and nitro drip Will hold IV Lasix given his Cr and plan right and left cath later today.  Continue Farxiga to facilitate diuresis and improve  LVEDP Scheduled for right and left heart catheterization today. Discussed risks, benefits, and alternatives with the patient and his son yesterday. NPO after midnight. Continue aspirin 81 mg p.o. daily Fasting lipid profile notes direct LDL of 137 mg/dL, Crestor uptitrated to 40 mg p.o. nightly this admission. Telemetry reviewed, normal sinus rhythm.  Acute combined systolic and diastolic heart failure, stage C, NYHA class III: Improving Weight on admission: 65kg Weight today: 57kg Total net UOP since admission: ~6L BNP improving  Will uptitrate GDMT status post left and right heart catheterization Further recommendations to follow.  Acute kidney injury on chronic kidney disease: Mostly secondary to diuresis due to heart failure with reduced EF. Hold Lasix prior to cath today.  Continue Farxiga. Monitor BUN and creatinine.  Hypokalemia: Most likely secondary to diuresis. Resolved.   Insulin-dependent diabetes mellitus type 2: Not well controlled. Last hemoglobin A1c 10.7 Currently management primary team.  Cigarette smoking: Currently smokes 0.5 packs/day. Educated on importance of complete smoking cessation.  Multivessel coronary artery calcification noted on not gated CT study: Continue aspirin and statin therapy.  Severe atherosclerosis of the thoracoabdominal aorta: Continue aspirin and statin therapy.  Patient's questions and concerns were addressed to his satisfaction. He voices understanding of the instructions provided during this encounter.   This note was created using a voice recognition software as a result there may be grammatical errors inadvertently enclosed that do not reflect the nature of this encounter. Every attempt is made to correct such errors.  Total time spent: 38 minutes.  Tessa Lerner, DO, [facility] Office: [phone] 08/22/2020, 0905am   ADDENDUM: Micah Flesher back to re-evaluate the patient due change in clinical status.    Was called by the patient's nurse regarding a pause greater than 5 seconds accompanied with chest pain.  Upon arrival patient states that his chest pain is still present but 2 out of 10 in intensity.  He does not have any conversational dyspnea at the current time.  When he came to the hospital patient stated that his chest pain was at least 8 out of 10.  EKG reviewed.  Findings discussed with interventional cardiologist.  Cath Lab not activated for STEMI but his case will be moved up sooner than originally scheduled.  IV heparin drip to continue and IV nitro drip uptitrated for symptom management.  Spoke to the patient's nurse Tresa Endo and have advised to closely monitor his vitals or change in clinical status.  Patient's son Renae Fickle was present at bedside findings discussed.  Patient and his family's questions and concerns were addressed to their satisfaction.  CRITICAL CARE Performed by: Tessa Lerner   Total critical care time: 35 minutes   Critical care time was exclusive of separately billable procedures and treating other patients.   Critical care was necessary to treat or prevent imminent or life-threatening deterioration.   Critical care was time spent personally by me on the following  activities: development of treatment plan with patient and/or surrogate as well as nursing, discussions with consultants, evaluation of patient's response to treatment, examination of patient, obtaining history from patient or surrogate, ordering and performing treatments and interventions, ordering and review of laboratory studies, ordering and review of radiographic studies, pulse oximetry and re-evaluation of patient's condition.  Tessa LernerSunit Shareen Capwell, DO, [facility] Pager: [phone] Office: [phone] 10:03 AM

## 2020-08-22 NOTE — Progress Notes (Signed)
ANTICOAGULATION CONSULT NOTE   Pharmacy Consult for Heparin Indication: chest pain/ACS  No Known Allergies  Patient Measurements: Height: 5\' 4"  (162.6 cm) Weight: 57.7 kg (127 lb 4.8 oz) IBW/kg (Calculated) : 59.2 Heparin Dosing Weight: 65 kg  Vital Signs: Temp: 98.6 F (37 C) (07/18 0341) Temp Source: Oral (07/18 0341) BP: 133/75 (07/18 0341)  Labs: Recent Labs    08/20/20 0449 08/20/20 0628 08/20/20 1632 08/20/20 1632 08/20/20 1911 08/21/20 0039 08/21/20 1053 08/21/20 1927 08/22/20 0312  HGB 13.4  --   --   --   --  13.0  --   --  11.5*  HCT 39.3  --   --   --   --  39.0  --   --  34.5*  PLT 227  --   --   --   --  238  --   --  207  LABPROT 13.5  --   --   --   --   --   --   --   --   INR 1.0  --   --   --   --   --   --   --   --   HEPARINUNFRC  --   --  0.96*   < >  --  0.10* 0.18* 0.33 0.26*  CREATININE 1.19  --   --   --   --  1.54*  --   --   --   TROPONINIHS 208* 195* 801*  --  970*  --   --   --   --    < > = values in this interval not displayed.     Estimated Creatinine Clearance: 36.4 mL/min (A) (by C-G formula based on SCr of 1.54 mg/dL (H)).   Assessment: 70 y.o. M presents with SOB. Trop elevated to 195. To begin heparin for ACS. No AC PTA. CBC ok on admission.  Heparin level down to subtherapeutic (0.26) on gtt at 1000 units/hr. No issues with line or bleeding reported per RN. Hgb down to 11.5.  Goal of Therapy:  Heparin level 0.3-0.7 units/ml Monitor platelets by anticoagulation protocol: Yes   Plan:  Increase heparin gtt to 1100 units/hr Will f/u heparin level in 8 hours   66, PharmD, BCPS Please see amion for complete clinical pharmacist phone list 08/22/2020 4:05 AM

## 2020-08-22 NOTE — Progress Notes (Signed)
Inpatient Diabetes Program Recommendations  AACE/ADA: New Consensus Statement on Inpatient Glycemic Control (2015)  Target Ranges:  Prepandial:   less than 140 mg/dL      Peak postprandial:   less than 180 mg/dL (1-2 hours)      Critically ill patients:  140 - 180 mg/dL   Results for Colin [name], Colin [name] (MRN [id number]) as of 08/22/2020 11:00  Ref. Range 08/21/2020 06:03 08/21/2020 12:20 08/21/2020 16:02 08/21/2020 21:44  Glucose-Capillary Latest Ref Range: 70 - 99 mg/dL  60 units Lantus given 7/16 @14 :55 75 299 (H)  8 units NOVOLOG  208 (H)  8 units NOVOLOG  245 (H)  2 units NOVOLOG   Results for Colin [name], Colin [name] (MRN Daria Pastures) as of 08/22/2020 11:00  Ref. Range 08/22/2020 03:48  Glucose-Capillary Latest Ref Range: 70 - 99 mg/dL 08/24/2020 (H)    Admit with: CP  History: DM  Home DM Meds: Lantus 30 units Daily  Current Orders: Novolog Moderate Correction Scale/ SSI (0-15 units) TID AC + HS          Farxiga 10 mg Daily   Endocrinologist: Dr. [id number] with [facility] Last seen 08/04/2020 Was told the following: For now, please start taking the lantus: 30 units daily (even though the prescription will say 60) , and: Stop taking the Novolog, Victoza, and metformin. Please come back for a follow-up appointment in 2 months    MD- Note Patient received 60 units Lantus on 07/16--CBG down to 70 on the AM of 07/17.  CBG now 189 this AM  Please consider adding back 50% home dose Lantus  Lantus 15 units QHS    --Will follow patient during hospitalization--  8/17 RN, MSN, CDE Diabetes Coordinator Inpatient Glycemic Control Team Team Pager: [phone] (8a-5p)

## 2020-08-22 NOTE — H&P (View-Only) (Signed)
70-year-old Colin [name] speaking patient with uncontrolled type 2 diabetes mellitus, mixed hyperlipidemia, tobacco dependence, coronary artery disease with prior MI, LCX and RCA stents in 2012, now admitted with chest pain.  Work-up showed EKG with old anterior infarct, dynamic inferolateral ST depression and T wave inversions, high sensitive troponin at 900, echocardiogram with EF 35-40% with LAD territory severe hypokinesis.  He now also has had at least 1 symptomatic sinus pause of 5.2 seconds.  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  We will plan to perform left and right heart catheterization with left radial artery access, and place right IJ temporary pacemaker. Should he require CABG, right radial artery could still be used as a conduit.  Discussed with electrophysiologist Dr. Taylor.  Should he have moderate nonobstructive disease, would prefer to place permanent pacemaker before committing him to antiplatelet therapy.  Should he have severe coronary artery disease, will proceed with revascularization as necessary.  Information was translated to the patient by his son Colin [name].   Colin Tones J Nocholas Damaso, MD Pager: [phone] Office: [phone]  

## 2020-08-22 NOTE — Progress Notes (Signed)
Initial Nutrition Assessment  DOCUMENTATION CODES:   Not applicable  INTERVENTION:   -Once diet is advanced, add:   -Glucerna Shake po TID, each supplement provides 220 kcal and 10 grams of protein  -MVI daily  NUTRITION DIAGNOSIS:   Increased nutrient needs related to chronic illness (CHF) as evidenced by estimated needs.  GOAL:   Patient will meet greater than or equal to 90% of their needs  MONITOR:   PO intake, Supplement acceptance, Diet advancement, Labs, Weight trends, Skin, I & O's  REASON FOR ASSESSMENT:   Consult Assessment of nutrition requirement/status  ASSESSMENT:   Colin [name] was admitted to the hospital with the working diagnosis of Non STEMI, complicated with acute systolic heart failure and AKI.  Pt admitted with non-STEMI.   Reviewed I/O's: -3.2 L x 24 hours and -5.8 L since admission  UOP: 3.7 L x 24 hours  Pt unavailable at time of visit. RD unable to obtain further nutrition-related history or complete nutrition-focused physical exam at this time.    Pt currently NPO for heart cath. No meal completion data available to assess at this time.   Reviewed wt hx; pt has experienced a 3.8% wt loss over the past 6 months, which is not significant for time frame. Suspect edema may be masking true weight loss as well as fat and muscle depletions.   Medications reviewed and include colace.   Lab Results  Component Value Date   HGBA1C 10.7 (A) 08/04/2020   PTA DM medications are 30 units insulin glargine daily. Per DM coordinator note, pt is followed by Dr. Everardo All (endocrinologist) as an outpatient.   Labs reviewed: CBGS: 189-245 (inpatient orders for glycemic control are 10 mg dapagliflozin propanediol, 0-15 units insulin aspart TID with meals, and 0-5 units insulin aspart daily at bedtime).    Diet Order:   Diet Order             Diet NPO time specified Except for: Sips with Meds  Diet effective midnight                   EDUCATION NEEDS:    No education needs have been identified at this time  Skin:  Skin Assessment: Reviewed RN Assessment  Last BM:  08/19/20  Height:   Ht Readings from Last 1 Encounters:  08/20/20 5\' 4"  (1.626 m)    Weight:   Wt Readings from Last 1 Encounters:  08/22/20 57.7 kg    Ideal Body Weight:  59.1 kg  BMI:  Body mass index is 21.85 kg/m.  Estimated Nutritional Needs:   Kcal:  1550-1750  Protein:  85-100 grams  Fluid:  > 1.5 L    08/24/20, RD, LDN, CDCES Registered Dietitian II Certified Diabetes Care and Education Specialist Please refer to [facility] for RD and/or RD on-call/weekend/after hours pager

## 2020-08-22 NOTE — Progress Notes (Signed)
PROGRESS NOTE    JSON KOELZER  JKD:[id number] DOB: 02-Apr-1950 DOA: 08/20/2020 PCP: Grayce Sessions, NP    Brief Narrative:  Colin [name] was admitted to the hospital with the working diagnosis of Non STEMI, complicated with acute systolic heart failure and AKI.    70 year old male past medical history for hypertension, dyslipidemia, type 2 diabetes mellitus who presented with chest pain and dyspnea.  Reported 24 hours of weakness, dyspnea and lower extremity edema.  Worse symptoms with exertion.  Limited history due to language barriers.  On his initial physical examination blood pressure 102/50, heart rate 82, respiratory rate 18-21, oxygen saturation 94%, his lungs are clear to auscultation bilaterally, heart S1-S2, present, rhythmic, abdomen soft, positive lower extremity edema.   Sodium 138, potassium 3.9, chloride 107, bicarb 26, glucose 350, BUN 17, creatinine 1.19, BNP 1079, high sensitive troponin 208, 197, 801, 970, white count 10.5, hemoglobin 13.4, hematocrit 39.3, platelets 227. SARS COVID-19 negative.   Chest radiograph, personally reviewed, hyperinflation, bilateral reticular infiltrates diffusely, no frank pleural effusions, fluid in the right fissure.  Cervical hyperextension.   EKG 93 bpm, normal axis, normal intervals, sinus rhythm, Q-wave V1-V3, ST depressions, T wave inversions V5 V6, LVH.   Patient has been placed on IV heparin and furosemide for diuresis.   Plan for cardiac cath today for coronary angiography.    Assessment & Plan:   Principal Problem:   New onset of congestive heart failure (HCC) Active Problems:   Dyslipidemia   Essential hypertension   NSTEMI (non-ST elevated myocardial infarction) (HCC)   DM type 2 with diabetic mixed hyperlipidemia (HCC)   Smoking   NSTMI. Patient with chest pain this am, no dyspnea.    On IV heparin and nitroglycerin drip. On rosuvastatin and aspirin Continue to hold on B blocker and ACE inh to prevent hypotension  or worsening renal function.    Follow up with cath results.    2. AKI, hypokalemia. Renal function with serum cr at 1.49 with K at 3,7 and serum bicarbonate at 27.  Continue to follow up on renal function in am including mag Add 40 Kcl today.    3. Acute systolic heart failure. Echocardiogram with LV EF 30 to 35% with moderate to severe decreased systolic function, global hypokinesis, mild reduction in RV systolic function, moderate mitral valve regurgitation.   Continue with dapagliflozin Improved volume status, urine output over last 24 hrs  3,700 ml Warm lower extremities, and systolic blood pressure 120 to 130 mmHg.   4. HTN. Continue with nitroglycerin drip for acs. Holding antihypertensive medications for now.    5. T2Dm/ hypoglycemia. Fasting glucose is up to 209 with capillary 189. Will continue with insulin sliding scale and will resume basal insulin at a lower dose to 10 units daily glargine. Patient has been npo after midnight for cardiac cath.    6. Tobacco abuse. Continue with nicotine patch, smoking cessation    Patient continue to be at high risk for worsening acs   Status is: Inpatient  Remains inpatient appropriate because:Inpatient level of care appropriate due to severity of illness  Dispo: The patient is from: Home              Anticipated d/c is to: Home              Patient currently is not medically stable to d/c.   Difficult to place patient No   DVT prophylaxis: Heparin   Code Status:   full  Family Communication:  No family at the bedside     Consultants:  Cardiology    Subjective: Patient with moderate chest pain this am but not dyspnea, no nausea or vomiting  Objective: Vitals:   08/22/20 0000 08/22/20 0020 08/22/20 0341 08/22/20 0751  BP: (!) 127/53 (!) 127/53 133/75 122/80  Pulse:    71  Resp:  19 18 13   Temp: 99.5 F (37.5 C)  98.6 F (37 C) 98.3 F (36.8 C)  TempSrc: Oral  Oral Oral  SpO2:   96% 95%  Weight:   57.7 kg    Height:        Intake/Output Summary (Last 24 hours) at 08/22/2020 1013 Last data filed at 08/22/2020 0700 Gross per 24 hour  Intake 480.72 ml  Output 3700 ml  Net -3219.28 ml   Filed Weights   08/20/20 0446 08/21/20 0454 08/22/20 0341  Weight: 65 kg 62.8 kg 57.7 kg    Examination:   General: Not in pain or dyspnea.  Neurology: Awake and alert, non focal  E ENT: no pallor, no icterus, oral mucosa moist Cardiovascular: No JVD. S1-S2 present, rhythmic, no gallops, rubs, or murmurs. No lower extremity edema. Pulmonary: positive breath sounds bilaterally, adequate air movement, no wheezing, rhonchi or rales. Gastrointestinal. Abdomen soft and non tender Skin. No rashes Musculoskeletal: no joint deformities     Data Reviewed: I have personally reviewed following labs and imaging studies  CBC: Recent Labs  Lab 08/20/20 0449 08/21/20 0039 08/22/20 0312  WBC 10.5 8.5 7.2  HGB 13.4 13.0 11.5*  HCT 39.3 39.0 34.5*  MCV 92.0 92.9 93.5  PLT 227 238 207   Basic Metabolic Panel: Recent Labs  Lab 08/20/20 0449 08/21/20 0039 08/22/20 0312  NA 138 142 141  K 3.9 3.2* 3.7  CL 107 105 107  CO2 26 32 27  GLUCOSE 350* 38* 209*  BUN 17 19 32*  CREATININE 1.19 1.54* 1.49*  CALCIUM 9.6 9.5 8.7*  MG  --   --  2.0   GFR: Estimated Creatinine Clearance: 37.6 mL/min (A) (by C-G formula based on SCr of 1.49 mg/dL (H)). Liver Function Tests: No results for input(s): AST, ALT, ALKPHOS, BILITOT, PROT, ALBUMIN in the last 168 hours. No results for input(s): LIPASE, AMYLASE in the last 168 hours. No results for input(s): AMMONIA in the last 168 hours. Coagulation Profile: Recent Labs  Lab 08/20/20 0449  INR 1.0   Cardiac Enzymes: No results for input(s): CKTOTAL, CKMB, CKMBINDEX, TROPONINI in the last 168 hours. BNP (last 3 results) No results for input(s): PROBNP in the last 8760 hours. HbA1C: No results for input(s): HGBA1C in the last 72 hours. CBG: Recent Labs  Lab  08/21/20 0603 08/21/20 1220 08/21/20 1602 08/21/20 2144 08/22/20 0348  GLUCAP 75 299* 208* 245* 189*   Lipid Profile: Recent Labs    08/21/20 0039  CHOL 187  HDL 40*  LDLCALC 136*  TRIG 53  CHOLHDL 4.7  LDLDIRECT 137.1*   Thyroid Function Tests: No results for input(s): TSH, T4TOTAL, FREET4, T3FREE, THYROIDAB in the last 72 hours. Anemia Panel: No results for input(s): VITAMINB12, FOLATE, FERRITIN, TIBC, IRON, RETICCTPCT in the last 72 hours.    Radiology Studies: I have reviewed all of the imaging during this hospital visit personally     Scheduled Meds:  [START ON 08/23/2020] aspirin EC  81 mg Oral Daily   dapagliflozin propanediol  10 mg Oral Daily   docusate sodium  100 mg Oral BID   gabapentin  100 mg  Oral TID   insulin aspart  0-15 Units Subcutaneous TID WC   insulin aspart  0-5 Units Subcutaneous QHS   rosuvastatin  40 mg Oral QHS   sodium chloride flush  3 mL Intravenous Q12H   sodium chloride flush  3 mL Intravenous Q12H   Continuous Infusions:  sodium chloride     sodium chloride 10 mL/hr at 08/22/20 0621   heparin 1,100 Units/hr (08/22/20 0625)   nitroGLYCERIN 2.5 mcg/min (08/22/20 0600)     LOS: 2 days        Colin [name] Annett Gula, MD

## 2020-08-22 NOTE — Progress Notes (Addendum)
Pre-CABG Dopplers completed.  Refer to "CV Proc" under chart review to view preliminary results.  08/22/2020 5:04 PM Eula Fried., MHA, RVT, RDCS, RDMS   Olen Cordial, RVT

## 2020-08-22 NOTE — Plan of Care (Signed)

## 2020-08-23 ENCOUNTER — Inpatient Hospital Stay (HOSPITAL_COMMUNITY): Payer: Medicare HMO | Admitting: Anesthesiology

## 2020-08-23 ENCOUNTER — Inpatient Hospital Stay (HOSPITAL_COMMUNITY)
Admission: EM | Disposition: A | Discharge status: Home Health | Payer: Self-pay | Source: Home / Self Care | Attending: Thoracic Surgery (Cardiothoracic Vascular Surgery)

## 2020-08-23 ENCOUNTER — Inpatient Hospital Stay (HOSPITAL_COMMUNITY): Payer: Medicare HMO

## 2020-08-23 ENCOUNTER — Encounter (HOSPITAL_COMMUNITY): Payer: Self-pay | Admitting: Internal Medicine

## 2020-08-23 DIAGNOSIS — I455 Other specified heart block: Secondary | ICD-10-CM

## 2020-08-23 DIAGNOSIS — I509 Heart failure, unspecified: Secondary | ICD-10-CM | POA: Diagnosis not present

## 2020-08-23 DIAGNOSIS — I214 Non-ST elevation (NSTEMI) myocardial infarction: Secondary | ICD-10-CM

## 2020-08-23 DIAGNOSIS — I2510 Atherosclerotic heart disease of native coronary artery without angina pectoris: Secondary | ICD-10-CM | POA: Diagnosis present

## 2020-08-23 DIAGNOSIS — I5021 Acute systolic (congestive) heart failure: Secondary | ICD-10-CM | POA: Diagnosis not present

## 2020-08-23 DIAGNOSIS — E785 Hyperlipidemia, unspecified: Secondary | ICD-10-CM | POA: Diagnosis not present

## 2020-08-23 DIAGNOSIS — I10 Essential (primary) hypertension: Secondary | ICD-10-CM | POA: Diagnosis not present

## 2020-08-23 DIAGNOSIS — I25110 Atherosclerotic heart disease of native coronary artery with unstable angina pectoris: Secondary | ICD-10-CM

## 2020-08-23 HISTORY — PX: VEIN HARVEST: SHX6363

## 2020-08-23 HISTORY — PX: CORONARY ARTERY BYPASS GRAFT: SHX141

## 2020-08-23 HISTORY — PX: TEE WITHOUT CARDIOVERSION: SHX5443

## 2020-08-23 LAB — POCT I-STAT, CHEM 8
BUN: 28 mg/dL — ABNORMAL HIGH (ref 8–23)
BUN: 29 mg/dL — ABNORMAL HIGH (ref 8–23)
BUN: 26 mg/dL — ABNORMAL HIGH (ref 8–23)
BUN: 26 mg/dL — ABNORMAL HIGH (ref 8–23)
Calcium, Ion: 1.28 mmol/L (ref 1.15–1.40)
Calcium, Ion: 1.23 mmol/L (ref 1.15–1.40)
Calcium, Ion: 1.02 mmol/L — ABNORMAL LOW (ref 1.15–1.40)
Calcium, Ion: 1.34 mmol/L (ref 1.15–1.40)
Chloride: 108 mmol/L (ref 98–111)
Chloride: 108 mmol/L (ref 98–111)
Chloride: 106 mmol/L (ref 98–111)
Chloride: 108 mmol/L (ref 98–111)
Creatinine, Ser: 1 mg/dL (ref 0.61–1.24)
Creatinine, Ser: 1.2 mg/dL (ref 0.61–1.24)
Creatinine, Ser: 1 mg/dL (ref 0.61–1.24)
Creatinine, Ser: 1.1 mg/dL (ref 0.61–1.24)
Glucose, Bld: 66 mg/dL — ABNORMAL LOW (ref 70–99)
Glucose, Bld: 95 mg/dL (ref 70–99)
Glucose, Bld: 104 mg/dL — ABNORMAL HIGH (ref 70–99)
Glucose, Bld: 108 mg/dL — ABNORMAL HIGH (ref 70–99)
HCT: 34 % — ABNORMAL LOW (ref 39.0–52.0)
HCT: 29 % — ABNORMAL LOW (ref 39.0–52.0)
HCT: 24 % — ABNORMAL LOW (ref 39.0–52.0)
HCT: 25 % — ABNORMAL LOW (ref 39.0–52.0)
Hemoglobin: 11.6 g/dL — ABNORMAL LOW (ref 13.0–17.0)
Hemoglobin: 9.9 g/dL — ABNORMAL LOW (ref 13.0–17.0)
Hemoglobin: 8.2 g/dL — ABNORMAL LOW (ref 13.0–17.0)
Hemoglobin: 8.5 g/dL — ABNORMAL LOW (ref 13.0–17.0)
Potassium: 4 mmol/L (ref 3.5–5.1)
Potassium: 4 mmol/L (ref 3.5–5.1)
Potassium: 5.3 mmol/L — ABNORMAL HIGH (ref 3.5–5.1)
Potassium: 5.1 mmol/L (ref 3.5–5.1)
Sodium: 144 mmol/L (ref 135–145)
Sodium: 142 mmol/L (ref 135–145)
Sodium: 140 mmol/L (ref 135–145)
Sodium: 141 mmol/L (ref 135–145)
TCO2: 23 mmol/L (ref 22–32)
TCO2: 27 mmol/L (ref 22–32)
TCO2: 26 mmol/L (ref 22–32)
TCO2: 26 mmol/L (ref 22–32)

## 2020-08-23 LAB — POCT I-STAT 7, (LYTES, BLD GAS, ICA,H+H)
Acid-Base Excess: 1 mmol/L (ref 0.0–2.0)
Acid-Base Excess: 1 mmol/L (ref 0.0–2.0)
Acid-Base Excess: 2 mmol/L (ref 0.0–2.0)
Acid-Base Excess: 0 mmol/L (ref 0.0–2.0)
Acid-Base Excess: 3 mmol/L — ABNORMAL HIGH (ref 0.0–2.0)
Bicarbonate: 25.5 mmol/L (ref 20.0–28.0)
Bicarbonate: 25.7 mmol/L (ref 20.0–28.0)
Bicarbonate: 27.5 mmol/L (ref 20.0–28.0)
Bicarbonate: 25 mmol/L (ref 20.0–28.0)
Bicarbonate: 26.5 mmol/L (ref 20.0–28.0)
Calcium, Ion: 1.28 mmol/L (ref 1.15–1.40)
Calcium, Ion: 1.27 mmol/L (ref 1.15–1.40)
Calcium, Ion: 1.01 mmol/L — ABNORMAL LOW (ref 1.15–1.40)
Calcium, Ion: 1.03 mmol/L — ABNORMAL LOW (ref 1.15–1.40)
Calcium, Ion: 1.31 mmol/L (ref 1.15–1.40)
HCT: 33 % — ABNORMAL LOW (ref 39.0–52.0)
HCT: 33 % — ABNORMAL LOW (ref 39.0–52.0)
HCT: 20 % — ABNORMAL LOW (ref 39.0–52.0)
HCT: 23 % — ABNORMAL LOW (ref 39.0–52.0)
HCT: 27 % — ABNORMAL LOW (ref 39.0–52.0)
Hemoglobin: 11.2 g/dL — ABNORMAL LOW (ref 13.0–17.0)
Hemoglobin: 11.2 g/dL — ABNORMAL LOW (ref 13.0–17.0)
Hemoglobin: 6.8 g/dL — CL (ref 13.0–17.0)
Hemoglobin: 7.8 g/dL — ABNORMAL LOW (ref 13.0–17.0)
Hemoglobin: 9.2 g/dL — ABNORMAL LOW (ref 13.0–17.0)
O2 Saturation: 91 %
O2 Saturation: 94 %
O2 Saturation: 100 %
O2 Saturation: 100 %
O2 Saturation: 100 %
Patient temperature: 37
Patient temperature: 37
Patient temperature: 37
Patient temperature: 37
Potassium: 4.1 mmol/L (ref 3.5–5.1)
Potassium: 4 mmol/L (ref 3.5–5.1)
Potassium: 4 mmol/L (ref 3.5–5.1)
Potassium: 5.1 mmol/L (ref 3.5–5.1)
Potassium: 5.7 mmol/L — ABNORMAL HIGH (ref 3.5–5.1)
Sodium: 143 mmol/L (ref 135–145)
Sodium: 144 mmol/L (ref 135–145)
Sodium: 142 mmol/L (ref 135–145)
Sodium: 140 mmol/L (ref 135–145)
Sodium: 141 mmol/L (ref 135–145)
TCO2: 27 mmol/L (ref 22–32)
TCO2: 27 mmol/L (ref 22–32)
TCO2: 29 mmol/L (ref 22–32)
TCO2: 26 mmol/L (ref 22–32)
TCO2: 28 mmol/L (ref 22–32)
pCO2 arterial: 39.3 mmHg (ref 32.0–48.0)
pCO2 arterial: 41.5 mmHg (ref 32.0–48.0)
pCO2 arterial: 44.8 mmHg (ref 32.0–48.0)
pCO2 arterial: 36.5 mmHg (ref 32.0–48.0)
pCO2 arterial: 42.7 mmHg (ref 32.0–48.0)
pH, Arterial: 7.42 (ref 7.350–7.450)
pH, Arterial: 7.399 (ref 7.350–7.450)
pH, Arterial: 7.396 (ref 7.350–7.450)
pH, Arterial: 7.376 (ref 7.350–7.450)
pH, Arterial: 7.469 — ABNORMAL HIGH (ref 7.350–7.450)
pO2, Arterial: 61 mmHg — ABNORMAL LOW (ref 83.0–108.0)
pO2, Arterial: 69 mmHg — ABNORMAL LOW (ref 83.0–108.0)
pO2, Arterial: 434 mmHg — ABNORMAL HIGH (ref 83.0–108.0)
pO2, Arterial: 254 mmHg — ABNORMAL HIGH (ref 83.0–108.0)
pO2, Arterial: 327 mmHg — ABNORMAL HIGH (ref 83.0–108.0)

## 2020-08-23 LAB — ECHO INTRAOPERATIVE TEE
Height: 64 in
MV M vel: 5.34 m/s
MV Peak grad: 114.1 mmHg
Radius: 0.4 cm
S' Lateral: 4.5 cm
Weight: 2056.45 oz

## 2020-08-23 LAB — POCT I-STAT EG7
Acid-Base Excess: 1 mmol/L (ref 0.0–2.0)
Bicarbonate: 26.2 mmol/L (ref 20.0–28.0)
Calcium, Ion: 1.11 mmol/L — ABNORMAL LOW (ref 1.15–1.40)
HCT: 22 % — ABNORMAL LOW (ref 39.0–52.0)
Hemoglobin: 7.5 g/dL — ABNORMAL LOW (ref 13.0–17.0)
O2 Saturation: 80 %
Patient temperature: 37
Potassium: 4.1 mmol/L (ref 3.5–5.1)
Sodium: 143 mmol/L (ref 135–145)
TCO2: 27 mmol/L (ref 22–32)
pCO2, Ven: 44.7 mmHg (ref 44.0–60.0)
pH, Ven: 7.375 (ref 7.250–7.430)
pO2, Ven: 46 mmHg — ABNORMAL HIGH (ref 32.0–45.0)

## 2020-08-23 LAB — CBC
HCT: 35.1 % — ABNORMAL LOW (ref 39.0–52.0)
HCT: 31.9 % — ABNORMAL LOW (ref 39.0–52.0)
Hemoglobin: 12.1 g/dL — ABNORMAL LOW (ref 13.0–17.0)
Hemoglobin: 10.7 g/dL — ABNORMAL LOW (ref 13.0–17.0)
MCH: 32.1 pg (ref 26.0–34.0)
MCH: 31.1 pg (ref 26.0–34.0)
MCHC: 34.5 g/dL (ref 30.0–36.0)
MCHC: 33.5 g/dL (ref 30.0–36.0)
MCV: 93.1 fL (ref 80.0–100.0)
MCV: 92.7 fL (ref 80.0–100.0)
Platelets: 200 10*3/uL (ref 150–400)
Platelets: 110 10*3/uL — ABNORMAL LOW (ref 150–400)
RBC: 3.77 MIL/uL — ABNORMAL LOW (ref 4.22–5.81)
RBC: 3.44 MIL/uL — ABNORMAL LOW (ref 4.22–5.81)
RDW: 12.9 % (ref 11.5–15.5)
RDW: 13.4 % (ref 11.5–15.5)
WBC: 6.5 10*3/uL (ref 4.0–10.5)
WBC: 7.5 10*3/uL (ref 4.0–10.5)
nRBC: 0 % (ref 0.0–0.2)
nRBC: 0 % (ref 0.0–0.2)

## 2020-08-23 LAB — PROTIME-INR
INR: 1.1 (ref 0.8–1.2)
INR: 1.3 — ABNORMAL HIGH (ref 0.8–1.2)
Prothrombin Time: 13.7 seconds (ref 11.4–15.2)
Prothrombin Time: 16.3 seconds — ABNORMAL HIGH (ref 11.4–15.2)

## 2020-08-23 LAB — COMPREHENSIVE METABOLIC PANEL
ALT: 17 U/L (ref 0–44)
AST: 17 U/L (ref 15–41)
Albumin: 2.6 g/dL — ABNORMAL LOW (ref 3.5–5.0)
Alkaline Phosphatase: 61 U/L (ref 38–126)
Anion gap: 4 — ABNORMAL LOW (ref 5–15)
BUN: 33 mg/dL — ABNORMAL HIGH (ref 8–23)
CO2: 25 mmol/L (ref 22–32)
Calcium: 8.7 mg/dL — ABNORMAL LOW (ref 8.9–10.3)
Chloride: 111 mmol/L (ref 98–111)
Creatinine, Ser: 1.27 mg/dL — ABNORMAL HIGH (ref 0.61–1.24)
GFR, Estimated: >60 mL/min (ref >60)
Glucose, Bld: 117 mg/dL — ABNORMAL HIGH (ref 70–99)
Potassium: 4 mmol/L (ref 3.5–5.1)
Sodium: 140 mmol/L (ref 135–145)
Total Bilirubin: 0.5 mg/dL (ref 0.3–1.2)
Total Protein: 5.6 g/dL — ABNORMAL LOW (ref 6.5–8.1)

## 2020-08-23 LAB — PLATELET COUNT: Platelets: 134 10*3/uL — ABNORMAL LOW (ref 150–400)

## 2020-08-23 LAB — BASIC METABOLIC PANEL
Anion gap: 3 — ABNORMAL LOW (ref 5–15)
BUN: 34 mg/dL — ABNORMAL HIGH (ref 8–23)
CO2: 28 mmol/L (ref 22–32)
Calcium: 8.5 mg/dL — ABNORMAL LOW (ref 8.9–10.3)
Chloride: 108 mmol/L (ref 98–111)
Creatinine, Ser: 1.29 mg/dL — ABNORMAL HIGH (ref 0.61–1.24)
GFR, Estimated: 60 mL/min — ABNORMAL LOW (ref >60)
Glucose, Bld: 222 mg/dL — ABNORMAL HIGH (ref 70–99)
Potassium: 4.3 mmol/L (ref 3.5–5.1)
Sodium: 139 mmol/L (ref 135–145)

## 2020-08-23 LAB — GLUCOSE, CAPILLARY
Glucose-Capillary: 207 mg/dL — ABNORMAL HIGH (ref 70–99)
Glucose-Capillary: 76 mg/dL (ref 70–99)
Glucose-Capillary: 93 mg/dL (ref 70–99)
Glucose-Capillary: 84 mg/dL (ref 70–99)
Glucose-Capillary: 84 mg/dL (ref 70–99)
Glucose-Capillary: 110 mg/dL — ABNORMAL HIGH (ref 70–99)
Glucose-Capillary: 110 mg/dL — ABNORMAL HIGH (ref 70–99)

## 2020-08-23 LAB — MAGNESIUM: Magnesium: 2.1 mg/dL (ref 1.7–2.4)

## 2020-08-23 LAB — HEMOGLOBIN AND HEMATOCRIT, BLOOD
HCT: 25.6 % — ABNORMAL LOW (ref 39.0–52.0)
Hemoglobin: 8.7 g/dL — ABNORMAL LOW (ref 13.0–17.0)

## 2020-08-23 LAB — HEPARIN LEVEL (UNFRACTIONATED): Heparin Unfractionated: 0.26 IU/mL — ABNORMAL LOW (ref 0.30–0.70)

## 2020-08-23 LAB — APTT
aPTT: 54 seconds — ABNORMAL HIGH (ref 24–36)
aPTT: 36 seconds (ref 24–36)

## 2020-08-23 LAB — HEMOGLOBIN A1C
Hgb A1c MFr Bld: 9.4 % — ABNORMAL HIGH (ref 4.8–5.6)
Mean Plasma Glucose: 223.08 mg/dL

## 2020-08-23 SURGERY — CORONARY ARTERY BYPASS GRAFTING (CABG)
Anesthesia: General | Site: Chest

## 2020-08-23 MED ORDER — METOPROLOL TARTRATE 5 MG/5ML IV SOLN: 2.5000 mg | INTRAVENOUS | Status: DC | PRN

## 2020-08-23 MED ORDER — STERILE WATER FOR IRRIGATION IR SOLN
Status: DC | PRN
  Administered 2020-08-23: 2000 mL

## 2020-08-23 MED ORDER — FENTANYL CITRATE (PF) 250 MCG/5ML IJ SOLN
INTRAMUSCULAR | Status: DC | PRN
  Administered 2020-08-23: 50 ug via INTRAVENOUS
  Administered 2020-08-23: 100 ug via INTRAVENOUS
  Administered 2020-08-23 (×3): 50 ug via INTRAVENOUS
  Administered 2020-08-23: 200 ug via INTRAVENOUS
  Administered 2020-08-23 (×2): 150 ug via INTRAVENOUS
  Administered 2020-08-23 (×2): 100 ug via INTRAVENOUS

## 2020-08-23 MED ORDER — ACETAMINOPHEN 160 MG/5ML PO SOLN: 1000.0000 mg | Freq: Four times a day (QID) | ORAL | Status: DC

## 2020-08-23 MED ORDER — SODIUM CHLORIDE 0.9% FLUSH
10.0000 mL | Freq: Two times a day (BID) | INTRAVENOUS | Status: DC
  Administered 2020-08-23 – 2020-08-26 (×5): 10 mL
  Administered 2020-08-26: 30 mL
  Administered 2020-08-27: 10 mL

## 2020-08-23 MED ORDER — BISACODYL 10 MG RE SUPP: 10.0000 mg | Freq: Every day | RECTAL | Status: DC

## 2020-08-23 MED ORDER — 0.9 % SODIUM CHLORIDE (POUR BTL) OPTIME
TOPICAL | Status: DC | PRN
  Administered 2020-08-23: 5000 mL

## 2020-08-23 MED ORDER — SODIUM CHLORIDE (PF) 0.9 % IJ SOLN
OROMUCOSAL | Status: DC | PRN
  Administered 2020-08-23 (×3): 4 mL via TOPICAL

## 2020-08-23 MED ORDER — SODIUM CHLORIDE 0.9% FLUSH: 3.0000 mL | INTRAVENOUS | Status: DC | PRN

## 2020-08-23 MED ORDER — ASPIRIN EC 325 MG PO TBEC
325.0000 mg | DELAYED_RELEASE_TABLET | Freq: Every day | ORAL | Status: DC
  Administered 2020-08-24 – 2020-08-27 (×4): 325 mg via ORAL
  Filled 2020-08-23 (×4): qty 1

## 2020-08-23 MED ORDER — LACTATED RINGERS IV SOLN: INTRAVENOUS | Status: DC

## 2020-08-23 MED ORDER — SODIUM CHLORIDE 0.9 % IV SOLN: INTRAVENOUS | Status: DC | PRN

## 2020-08-23 MED ORDER — CHLORHEXIDINE GLUCONATE CLOTH 2 % EX PADS
6.0000 | MEDICATED_PAD | Freq: Every day | CUTANEOUS | Status: DC
  Administered 2020-08-25 – 2020-08-26 (×2): 6 via TOPICAL

## 2020-08-23 MED ORDER — DEXTROSE 50 % IV SOLN: 0.0000 mL | INTRAVENOUS | Status: DC | PRN

## 2020-08-23 MED ORDER — ONDANSETRON HCL 4 MG/2ML IJ SOLN: 4.0000 mg | Freq: Four times a day (QID) | INTRAMUSCULAR | Status: DC | PRN

## 2020-08-23 MED ORDER — ETOMIDATE 2 MG/ML IV SOLN
INTRAVENOUS | Status: DC | PRN
  Administered 2020-08-23: 10 mg via INTRAVENOUS

## 2020-08-23 MED ORDER — MIDAZOLAM HCL 5 MG/5ML IJ SOLN
INTRAMUSCULAR | Status: DC | PRN
  Administered 2020-08-23: 1 mg via INTRAVENOUS
  Administered 2020-08-23: 2 mg via INTRAVENOUS
  Administered 2020-08-23: 1 mg via INTRAVENOUS
  Administered 2020-08-23 (×3): 2 mg via INTRAVENOUS

## 2020-08-23 MED ORDER — ALBUMIN HUMAN 5 % IV SOLN
250.0000 mL | INTRAVENOUS | Status: AC | PRN
  Administered 2020-08-23 – 2020-08-24 (×3): 12.5 g via INTRAVENOUS
  Filled 2020-08-23: qty 250

## 2020-08-23 MED ORDER — MAGNESIUM SULFATE 4 GM/100ML IV SOLN
4.0000 g | Freq: Once | INTRAVENOUS | Status: AC
  Administered 2020-08-23: 4 g via INTRAVENOUS
  Filled 2020-08-23: qty 100

## 2020-08-23 MED ORDER — DEXMEDETOMIDINE HCL IN NACL 400 MCG/100ML IV SOLN
0.0000 ug/kg/h | INTRAVENOUS | Status: DC
  Administered 2020-08-23: 0.7 ug/kg/h via INTRAVENOUS

## 2020-08-23 MED ORDER — CHLORHEXIDINE GLUCONATE 0.12 % MT SOLN
15.0000 mL | OROMUCOSAL | Status: AC
  Administered 2020-08-23: 15 mL via OROMUCOSAL
  Filled 2020-08-23: qty 15

## 2020-08-23 MED ORDER — SODIUM CHLORIDE 0.9 % IV SOLN: INTRAVENOUS | Status: DC

## 2020-08-23 MED ORDER — PROPOFOL 10 MG/ML IV BOLUS
INTRAVENOUS | Status: AC
  Filled 2020-08-23: qty 20

## 2020-08-23 MED ORDER — ARTIFICIAL TEARS OPHTHALMIC OINT
TOPICAL_OINTMENT | OPHTHALMIC | Status: DC | PRN
  Administered 2020-08-23: 1 via OPHTHALMIC

## 2020-08-23 MED ORDER — PROPOFOL 10 MG/ML IV BOLUS
INTRAVENOUS | Status: DC | PRN
  Administered 2020-08-23: 20 mg via INTRAVENOUS
  Administered 2020-08-23: 10 mg via INTRAVENOUS

## 2020-08-23 MED ORDER — DOBUTAMINE IN D5W 4-5 MG/ML-% IV SOLN
INTRAVENOUS | Status: DC | PRN
  Administered 2020-08-23: 5 ug/kg/min via INTRAVENOUS

## 2020-08-23 MED ORDER — ASPIRIN 81 MG PO CHEW: 324.0000 mg | CHEWABLE_TABLET | Freq: Every day | ORAL | Status: DC

## 2020-08-23 MED ORDER — ROCURONIUM BROMIDE 10 MG/ML (PF) SYRINGE
PREFILLED_SYRINGE | INTRAVENOUS | Status: AC
  Filled 2020-08-23: qty 10

## 2020-08-23 MED ORDER — TRAMADOL HCL 50 MG PO TABS
50.0000 mg | ORAL_TABLET | ORAL | Status: DC | PRN
  Administered 2020-08-26 – 2020-08-28 (×6): 50 mg via ORAL
  Filled 2020-08-23 (×6): qty 1

## 2020-08-23 MED ORDER — LACTATED RINGERS IV SOLN
500.0000 mL | Freq: Once | INTRAVENOUS | Status: DC | PRN
Start: 2020-08-23 — End: 2020-08-26

## 2020-08-23 MED ORDER — VANCOMYCIN HCL IN DEXTROSE 1-5 GM/200ML-% IV SOLN
1000.0000 mg | Freq: Once | INTRAVENOUS | Status: AC
  Administered 2020-08-24: 1000 mg via INTRAVENOUS
  Filled 2020-08-23: qty 200

## 2020-08-23 MED ORDER — FENTANYL CITRATE (PF) 100 MCG/2ML IJ SOLN
INTRAMUSCULAR | Status: AC
  Filled 2020-08-23: qty 2

## 2020-08-23 MED ORDER — DOCUSATE SODIUM 100 MG PO CAPS
200.0000 mg | ORAL_CAPSULE | Freq: Every day | ORAL | Status: DC
Start: 2020-08-24 — End: 2020-08-28
  Administered 2020-08-24 – 2020-08-27 (×4): 200 mg via ORAL
  Filled 2020-08-23 (×4): qty 2

## 2020-08-23 MED ORDER — INSULIN REGULAR(HUMAN) IN NACL 100-0.9 UT/100ML-% IV SOLN: INTRAVENOUS | Status: DC

## 2020-08-23 MED ORDER — FAMOTIDINE IN NACL 20-0.9 MG/50ML-% IV SOLN
20.0000 mg | Freq: Two times a day (BID) | INTRAVENOUS | Status: AC
  Administered 2020-08-23 – 2020-08-24 (×2): 20 mg via INTRAVENOUS
  Filled 2020-08-23 (×2): qty 50

## 2020-08-23 MED ORDER — CHLORHEXIDINE GLUCONATE 0.12 % MT SOLN
OROMUCOSAL | Status: AC
  Filled 2020-08-23: qty 15

## 2020-08-23 MED ORDER — SODIUM CHLORIDE 0.45 % IV SOLN: INTRAVENOUS | Status: DC | PRN

## 2020-08-23 MED ORDER — MORPHINE SULFATE (PF) 2 MG/ML IV SOLN
1.0000 mg | INTRAVENOUS | Status: DC | PRN
  Administered 2020-08-23 (×2): 2 mg via INTRAVENOUS
  Administered 2020-08-23: 4 mg via INTRAVENOUS
  Administered 2020-08-23: 2 mg via INTRAVENOUS
  Administered 2020-08-24 – 2020-08-25 (×3): 4 mg via INTRAVENOUS
  Filled 2020-08-23: qty 1
  Filled 2020-08-23 (×5): qty 2

## 2020-08-23 MED ORDER — FENTANYL CITRATE (PF) 250 MCG/5ML IJ SOLN
INTRAMUSCULAR | Status: AC
  Filled 2020-08-23: qty 20

## 2020-08-23 MED ORDER — ACETAMINOPHEN 500 MG PO TABS
1000.0000 mg | ORAL_TABLET | Freq: Four times a day (QID) | ORAL | Status: DC
  Administered 2020-08-24 – 2020-08-28 (×18): 1000 mg via ORAL
  Filled 2020-08-23 (×18): qty 2

## 2020-08-23 MED ORDER — PLASMA-LYTE A IV SOLN
INTRAVENOUS | Status: DC | PRN
  Administered 2020-08-23: 1000 mL via INTRAVASCULAR

## 2020-08-23 MED ORDER — ROCURONIUM BROMIDE 10 MG/ML (PF) SYRINGE
PREFILLED_SYRINGE | INTRAVENOUS | Status: DC | PRN
Start: 2020-08-23 — End: 2020-08-23
  Administered 2020-08-23: 40 mg via INTRAVENOUS
  Administered 2020-08-23: 60 mg via INTRAVENOUS
  Administered 2020-08-23: 100 mg via INTRAVENOUS

## 2020-08-23 MED ORDER — SODIUM CHLORIDE 0.9% FLUSH: 10.0000 mL | INTRAVENOUS | Status: DC | PRN

## 2020-08-23 MED ORDER — SODIUM CHLORIDE 0.9% FLUSH
3.0000 mL | Freq: Two times a day (BID) | INTRAVENOUS | Status: DC
  Administered 2020-08-24 – 2020-08-27 (×7): 3 mL via INTRAVENOUS

## 2020-08-23 MED ORDER — METOPROLOL TARTRATE 12.5 MG HALF TABLET: 12.5000 mg | ORAL_TABLET | Freq: Two times a day (BID) | ORAL | Status: DC

## 2020-08-23 MED ORDER — LACTATED RINGERS IV SOLN: INTRAVENOUS | Status: DC | PRN

## 2020-08-23 MED ORDER — MIDAZOLAM HCL 2 MG/2ML IJ SOLN: 2.0000 mg | INTRAMUSCULAR | Status: DC | PRN

## 2020-08-23 MED ORDER — CHLORHEXIDINE GLUCONATE 0.12 % MT SOLN
15.0000 mL | Freq: Once | OROMUCOSAL | Status: AC
  Administered 2020-08-23: 15 mL via OROMUCOSAL

## 2020-08-23 MED ORDER — NITROGLYCERIN IN D5W 200-5 MCG/ML-% IV SOLN: 0.0000 ug/min | INTRAVENOUS | Status: DC

## 2020-08-23 MED ORDER — NOREPINEPHRINE 4 MG/250ML-% IV SOLN: 0.0000 ug/min | INTRAVENOUS | Status: DC

## 2020-08-23 MED ORDER — OXYCODONE HCL 5 MG PO TABS
5.0000 mg | ORAL_TABLET | ORAL | Status: DC | PRN
  Administered 2020-08-24 – 2020-08-27 (×12): 5 mg via ORAL
  Filled 2020-08-23 (×12): qty 1

## 2020-08-23 MED ORDER — HEPARIN SODIUM (PORCINE) 1000 UNIT/ML IJ SOLN: INTRAMUSCULAR | Status: DC | PRN

## 2020-08-23 MED ORDER — ACETAMINOPHEN 160 MG/5ML PO SOLN: 650.0000 mg | Freq: Once | ORAL | Status: DC

## 2020-08-23 MED ORDER — ORAL CARE MOUTH RINSE
15.0000 mL | OROMUCOSAL | Status: DC
  Administered 2020-08-23 – 2020-08-24 (×2): 15 mL via OROMUCOSAL

## 2020-08-23 MED ORDER — CEFAZOLIN SODIUM-DEXTROSE 2-4 GM/100ML-% IV SOLN
2.0000 g | Freq: Three times a day (TID) | INTRAVENOUS | Status: AC
Start: 2020-08-23 — End: 2020-08-25
  Administered 2020-08-23 – 2020-08-25 (×6): 2 g via INTRAVENOUS
  Filled 2020-08-23 (×6): qty 100

## 2020-08-23 MED ORDER — ORAL CARE MOUTH RINSE: 15.0000 mL | Freq: Once | OROMUCOSAL | Status: AC

## 2020-08-23 MED ORDER — PROTAMINE SULFATE 10 MG/ML IV SOLN
INTRAVENOUS | Status: DC | PRN
  Administered 2020-08-23: 250 mg via INTRAVENOUS

## 2020-08-23 MED ORDER — METOPROLOL TARTRATE 25 MG/10 ML ORAL SUSPENSION: 12.5000 mg | Freq: Two times a day (BID) | ORAL | Status: DC

## 2020-08-23 MED ORDER — SODIUM CHLORIDE 0.9 % IV SOLN: 250.0000 mL | INTRAVENOUS | Status: DC

## 2020-08-23 MED ORDER — MIDAZOLAM HCL 2 MG/2ML IJ SOLN
INTRAMUSCULAR | Status: AC
  Filled 2020-08-23: qty 2

## 2020-08-23 MED ORDER — CHLORHEXIDINE GLUCONATE 0.12% ORAL RINSE (MEDLINE KIT)
15.0000 mL | Freq: Two times a day (BID) | OROMUCOSAL | Status: DC
Start: 2020-08-23 — End: 2020-08-26
  Administered 2020-08-23 – 2020-08-25 (×5): 15 mL via OROMUCOSAL

## 2020-08-23 MED ORDER — ACETAMINOPHEN 650 MG RE SUPP: 650.0000 mg | Freq: Once | RECTAL | Status: DC

## 2020-08-23 MED ORDER — EPINEPHRINE HCL 5 MG/250ML IV SOLN IN NS: 0.0000 ug/min | INTRAVENOUS | Status: DC

## 2020-08-23 MED ORDER — PHENYLEPHRINE 40 MCG/ML (10ML) SYRINGE FOR IV PUSH (FOR BLOOD PRESSURE SUPPORT)
PREFILLED_SYRINGE | INTRAVENOUS | Status: DC | PRN
  Administered 2020-08-23: 40 ug via INTRAVENOUS
  Administered 2020-08-23 (×2): 120 ug via INTRAVENOUS
  Administered 2020-08-23: 80 ug via INTRAVENOUS
  Administered 2020-08-23: 40 ug via INTRAVENOUS
  Administered 2020-08-23: 120 ug via INTRAVENOUS
  Administered 2020-08-23 (×2): 80 ug via INTRAVENOUS

## 2020-08-23 MED ORDER — BISACODYL 5 MG PO TBEC
10.0000 mg | DELAYED_RELEASE_TABLET | Freq: Every day | ORAL | Status: DC
Start: 2020-08-24 — End: 2020-08-28
  Administered 2020-08-24 – 2020-08-27 (×4): 10 mg via ORAL
  Filled 2020-08-23 (×4): qty 2

## 2020-08-23 MED ORDER — DOBUTAMINE IN D5W 4-5 MG/ML-% IV SOLN
3.0000 ug/kg/min | INTRAVENOUS | Status: DC
  Administered 2020-08-23: 5 ug/kg/min via INTRAVENOUS
  Administered 2020-08-24: 3 ug/kg/min via INTRAVENOUS

## 2020-08-23 MED ORDER — HEPARIN SODIUM (PORCINE) 1000 UNIT/ML IJ SOLN
INTRAMUSCULAR | Status: DC | PRN
  Administered 2020-08-23: 10000 [IU] via INTRAVENOUS
  Administered 2020-08-23: 20000 [IU] via INTRAVENOUS

## 2020-08-23 MED ORDER — PANTOPRAZOLE SODIUM 40 MG PO TBEC
40.0000 mg | DELAYED_RELEASE_TABLET | Freq: Every day | ORAL | Status: DC
  Administered 2020-08-25 – 2020-08-27 (×3): 40 mg via ORAL
  Filled 2020-08-23 (×3): qty 1

## 2020-08-23 MED ORDER — POTASSIUM CHLORIDE 10 MEQ/50ML IV SOLN: 10.0000 meq | INTRAVENOUS | Status: AC

## 2020-08-23 MED ORDER — MIDAZOLAM HCL (PF) 10 MG/2ML IJ SOLN
INTRAMUSCULAR | Status: AC
  Filled 2020-08-23: qty 2

## 2020-08-23 SURGICAL SUPPLY — 85 items
BAG DECANTER FOR FLEXI CONT (MISCELLANEOUS) ×4 IMPLANT
BLADE CLIPPER SURG (BLADE) ×4 IMPLANT
BLADE STERNUM SYSTEM 6 (BLADE) ×4 IMPLANT
BLADE SURG SZ11 CARB STEEL (BLADE) ×4 IMPLANT
BNDG ELASTIC 4X5.8 VLCR STR LF (GAUZE/BANDAGES/DRESSINGS) ×4 IMPLANT
BNDG ELASTIC 6X5.8 VLCR STR LF (GAUZE/BANDAGES/DRESSINGS) ×4 IMPLANT
BNDG GAUZE ELAST 4 BULKY (GAUZE/BANDAGES/DRESSINGS) ×4 IMPLANT
CABLE SURGICAL S-101-97-12 (CABLE) IMPLANT
CANISTER SUCT 3000ML PPV (MISCELLANEOUS) ×4 IMPLANT
CANNULA MC2 2 STG 29/37 NON-V (CANNULA) ×3 IMPLANT
CANNULA MC2 TWO STAGE (CANNULA) ×3
CANNULA NON VENT 20FR 12 (CANNULA) ×4 IMPLANT
CATH ROBINSON RED A/P 18FR (CATHETERS) ×8 IMPLANT
CLIP RETRACTION 3.0MM CORONARY (MISCELLANEOUS) IMPLANT
CLIP VESOCCLUDE MED 24/CT (CLIP) IMPLANT
CLIP VESOCCLUDE SM WIDE 24/CT (CLIP) IMPLANT
CONN ST 1/2X1/2  BEN (MISCELLANEOUS) ×3
CONN ST 1/2X1/2 BEN (MISCELLANEOUS) ×3 IMPLANT
CONNECTOR BLAKE 2:1 CARIO BLK (MISCELLANEOUS) ×4 IMPLANT
CONTAINER PROTECT SURGISLUSH (MISCELLANEOUS) ×4 IMPLANT
DERMABOND ADVANCED (GAUZE/BANDAGES/DRESSINGS) ×1
DERMABOND ADVANCED .7 DNX12 (GAUZE/BANDAGES/DRESSINGS) ×3 IMPLANT
DRAIN CHANNEL 19F RND (DRAIN) ×12 IMPLANT
DRAIN CONNECTOR BLAKE 1:1 (MISCELLANEOUS) ×4 IMPLANT
DRAPE CARDIOVASCULAR INCISE (DRAPES) ×2
DRAPE INCISE IOBAN 66X45 STRL (DRAPES) ×4 IMPLANT
DRAPE SRG 135X102X78XABS (DRAPES) ×3 IMPLANT
DRAPE WARM FLUID 44X44 (DRAPES) ×4 IMPLANT
DRSG AQUACEL AG ADV 3.5X10 (GAUZE/BANDAGES/DRESSINGS) ×4 IMPLANT
DRSG COVADERM 4X14 (GAUZE/BANDAGES/DRESSINGS) IMPLANT
ELECT BLADE 4.0 EZ CLEAN MEGAD (MISCELLANEOUS) ×4
ELECT REM PT RETURN 9FT ADLT (ELECTROSURGICAL) ×8
ELECTRODE BLDE 4.0 EZ CLN MEGD (MISCELLANEOUS) ×3 IMPLANT
ELECTRODE REM PT RTRN 9FT ADLT (ELECTROSURGICAL) ×6 IMPLANT
FELT TEFLON 1X6 (MISCELLANEOUS) ×4 IMPLANT
GAUZE SPONGE 4X4 12PLY STRL (GAUZE/BANDAGES/DRESSINGS) ×8 IMPLANT
GAUZE SPONGE 4X4 12PLY STRL LF (GAUZE/BANDAGES/DRESSINGS) ×8 IMPLANT
GLOVE SURG ENC MOIS LTX SZ7 (GLOVE) ×8 IMPLANT
GLOVE SURG ENC TEXT LTX SZ7.5 (GLOVE) ×8 IMPLANT
GLOVE SURG MICRO LTX SZ6 (GLOVE) ×4 IMPLANT
GLOVE SURG MICRO LTX SZ6.5 (GLOVE) ×16 IMPLANT
GLOVE SURG UNDER POLY LF SZ6.5 (GLOVE) ×28 IMPLANT
GOWN STRL REUS W/ TWL LRG LVL3 (GOWN DISPOSABLE) ×24 IMPLANT
GOWN STRL REUS W/ TWL XL LVL3 (GOWN DISPOSABLE) ×6 IMPLANT
GOWN STRL REUS W/TWL LRG LVL3 (GOWN DISPOSABLE) ×16
GOWN STRL REUS W/TWL XL LVL3 (GOWN DISPOSABLE) ×6
HEMOSTAT POWDER SURGIFOAM 1G (HEMOSTASIS) ×12 IMPLANT
INSERT SUTURE HOLDER (MISCELLANEOUS) ×4 IMPLANT
KIT BASIN OR (CUSTOM PROCEDURE TRAY) ×4 IMPLANT
KIT SUCTION CATH 14FR (SUCTIONS) ×4 IMPLANT
KIT TURNOVER KIT B (KITS) ×4 IMPLANT
KIT VASOVIEW HEMOPRO 2 VH 4000 (KITS) ×4 IMPLANT
LEAD PACING MYOCARDI (MISCELLANEOUS) ×4 IMPLANT
MARKER GRAFT CORONARY BYPASS (MISCELLANEOUS) ×12 IMPLANT
NS IRRIG 1000ML POUR BTL (IV SOLUTION) ×20 IMPLANT
PACK ACCESSORY CANNULA KIT (KITS) ×4 IMPLANT
PACK E OPEN HEART (SUTURE) ×4 IMPLANT
PACK OPEN HEART (CUSTOM PROCEDURE TRAY) ×4 IMPLANT
PAD ARMBOARD 7.5X6 YLW CONV (MISCELLANEOUS) IMPLANT
PAD ELECT DEFIB RADIOL ZOLL (MISCELLANEOUS) ×4 IMPLANT
PENCIL BUTTON HOLSTER BLD 10FT (ELECTRODE) ×4 IMPLANT
POSITIONER HEAD DONUT 9IN (MISCELLANEOUS) ×4 IMPLANT
PUNCH AORTIC ROTATE 4.0MM (MISCELLANEOUS) ×4 IMPLANT
SET MPS 3-ND DEL (MISCELLANEOUS) ×4 IMPLANT
SUPPORT HEART JANKE-BARRON (MISCELLANEOUS) ×4 IMPLANT
SUT BONE WAX W31G (SUTURE) ×4 IMPLANT
SUT ETHIBOND X763 2 0 SH 1 (SUTURE) ×8 IMPLANT
SUT MNCRL AB 3-0 PS2 18 (SUTURE) ×12 IMPLANT
SUT MNCRL AB 4-0 PS2 18 (SUTURE) ×4 IMPLANT
SUT PDS AB 1 CTX 36 (SUTURE) ×8 IMPLANT
SUT PROLENE 4 0 SH DA (SUTURE) ×4 IMPLANT
SUT PROLENE 5 0 C 1 36 (SUTURE) ×12 IMPLANT
SUT PROLENE 7 0 BV1 MDA (SUTURE) ×8 IMPLANT
SUT STEEL 6MS V (SUTURE) ×8 IMPLANT
SUT VIC AB 2-0 CT1 27 (SUTURE) ×4
SUT VIC AB 2-0 CT1 TAPERPNT 27 (SUTURE) ×6 IMPLANT
SYSTEM SAHARA CHEST DRAIN ATS (WOUND CARE) ×4 IMPLANT
TAPE CLOTH SURG 4X10 WHT LF (GAUZE/BANDAGES/DRESSINGS) ×4 IMPLANT
TAPE PAPER 2X10 WHT MICROPORE (GAUZE/BANDAGES/DRESSINGS) ×4 IMPLANT
TOWEL GREEN STERILE (TOWEL DISPOSABLE) ×4 IMPLANT
TOWEL GREEN STERILE FF (TOWEL DISPOSABLE) ×4 IMPLANT
TRAY FOLEY SLVR 16FR TEMP STAT (SET/KITS/TRAYS/PACK) ×4 IMPLANT
TUBING LAP HI FLOW INSUFFLATIO (TUBING) ×4 IMPLANT
UNDERPAD 30X36 HEAVY ABSORB (UNDERPADS AND DIAPERS) ×4 IMPLANT
WATER STERILE IRR 1000ML POUR (IV SOLUTION) ×8 IMPLANT

## 2020-08-23 NOTE — Progress Notes (Signed)
ANTICOAGULATION CONSULT NOTE   Pharmacy Consult for Heparin Indication: chest pain/ACS, CABG 7/19  No Known Allergies  Patient Measurements: Height: 5\' 4"  (162.6 cm) Weight: 58.3 kg (128 lb 8.5 oz) IBW/kg (Calculated) : 59.2 Heparin Dosing Weight: 65 kg  Vital Signs: Temp: 98.2 F (36.8 C) (07/19 0400) Temp Source: Oral (07/19 0400) BP: 125/89 (07/19 0700) Pulse Rate: 68 (07/19 0700)  Labs: Recent Labs    08/20/20 1632 08/20/20 1632 08/20/20 1911 08/21/20 0039 08/21/20 1053 08/21/20 1927 08/22/20 0312 08/22/20 1134 08/23/20 0500  HGB  --    < >  --  13.0  --   --  11.5* 12.2* 12.1*  HCT  --    < >  --  39.0  --   --  34.5* 36.0* 35.1*  PLT  --   --   --  238  --   --  207  --  200  HEPARINUNFRC 0.96*  --   --  0.10*   < > 0.33 0.26*  --  0.26*  CREATININE  --   --   --  1.54*  --   --  1.49*  --  1.29*  TROPONINIHS 801*  --  970*  --   --   --   --   --   --    < > = values in this interval not displayed.     Estimated Creatinine Clearance: 43.9 mL/min (A) (by C-G formula based on SCr of 1.29 mg/dL (H)).   Assessment: 70 y.o. M presents with SOB. Trop elevated to 195. To begin heparin for ACS. No AC PTA. CBC ok on admission.  Heparin level down to subtherapeutic (0.26) on gtt at 1000 units/hr. No issues with line or bleeding reported per RN. Hgb down to 11.5.  7/19 AM update:  Heparin level low  CABG later today  Goal of Therapy:  Heparin level 0.3-0.7 units/ml Monitor platelets by anticoagulation protocol: Yes   Plan:  Inc heparin to 1200 units/hr No f/u heparin level needed-CABG later today   8/19, PharmD, BCPS Clinical Pharmacist Phone: [phone]

## 2020-08-23 NOTE — Hospital Course (Addendum)
HPI:  We are asked to see this 70 year old male in cardiac surgical consultation for consideration of coronary artery surgical revascularization.  The patient presented to the emergency department on 08/20/2020 with initial complaints of chest pain and shortness of breath.  The patient is from [place] and speaks limited [place].  He is from [place].  He has multiple cardiac risk factors including hypertension, tobacco abse, hyperlipidemia and diabetes mellitus.  The pain began approximately 1 day prior to presentation and was associated with weakness and shortness of breath.  It is noted to be somewhat worse with exertion.  In the emergency department he was noted to have a elevated troponin.  Initial high sensitive troponin was 208 with last repeated value on 08/20/2020 970.  Thus he ruled in for non-STEMI.  He was also noted to have a significantly elevated beta natruretic peptide which peaked at 1079.  Reportedly the patient had a recent endocrinology appointment at which time he was told to stop all diabetes medications except Lantus however he stopped all his medications including blood pressure medications. Has findings were consistent with ACS with new CHF felt likely to be ischemic . Cardiology consultation was obtained and he was admitted for further evaluation and treatment.  Initial EKG did show sinus rhythm with nonspecific ST changes felt to be concerning for lateral ischemia.  He has began on heparin and nitroglycerin drips in the emergency department and was additionally ordered beta-blocker by cardiology.  His initial renal function was in the normal range but he did evidence acute kidney insufficiency and most recent creatinine on today's date is 1.49 with a BUN of 32.  His most recent hemoglobin A1c dated 08/04/2020 shows poor control of his diabetes with a value of 10.7.  He had evaluated in April of this year also of 13.0.  Echocardiogram performed this hospitalization reveals reduced  left ventricular ejection fraction by estimation at 30 to 35%.  Please see the full report listed below.  Cardiac catheterization performed today shows evidence of severe three-vessel coronary artery disease including in-stent restenosis in the circumflex and right coronary artery distributions.  Please see the full report below.   Note-was unable to obtain an interpreter at time of evaluation but did speak with son who speaks [place].  He will assist in discussing procedure with the patient but he and other family members feel as though we should proceed with surgery if that is our recommendation.  Dr. Cliffton Asters discussed the need for coronary artery bypass grafting surgery. Potential risks, benefits, and complications of the surgery were discussed with the patient and he agreed to proceed with surgery. Pre operative carotid duplex US showed no significant right internal carotid artery stenosis and a 40-59% left internal carotid artery stenosis. Patient underwent a CABG x 3  by Dr. Cliffton Asters on 08/23/2020.  Hospital Course:  Patient was transferred from the OR to [facility] ICU in stable condition. Patient was extubated later the evening of surgery without difficulty. Patient was weaned off Dobutamine and Levophed as able. He had mildly elevated creatinine the morning of post operative day one (1.36). He was volume overloaded and diuresed accordingly with careful monitoring of creatinine. He was weaned off the Insulin drip. He has a history of diabetes mellitus. His pre op HGA1C is 9.4. He will need close medical follow up after discharge. We continued to work on fluid overload and started him on a diuretic. He has been walking in the halls but needs a lot of encouragement to use  his incentive spirometer. We started a diuretic regimen for fluid overload. We appreciated HF following along with [place] and assisting with care. He remains on Dobutamine. We transferred the patient to 4E on 7/24. He was ambulating in the  halls with assistance. Dr. Rosemary Holms took over the care of the patient when he was transferred to the floor. He cautiously started bisoprolol for his sinus tachycardia. On 7/26 home health was being set up. He was making some forward progress with his ambulation.

## 2020-08-23 NOTE — Anesthesia Preprocedure Evaluation (Addendum)
Anesthesia Evaluation  Patient identified by MRN, date of birth, ID band Patient awake    Reviewed: Allergy & Precautions, NPO status , Patient's Chart, lab work & pertinent test results  Airway Mallampati: II  TM Distance: >3 FB Neck ROM: Full    Dental  (+) Lower Dentures   Pulmonary Current Smoker and Patient abstained from smoking.,    Pulmonary exam normal breath sounds clear to auscultation       Cardiovascular hypertension, Pt. on medications + angina + CAD, + Past MI, + Cardiac Stents and +CHF  Normal cardiovascular exam Rhythm:Regular Rate:Normal  ECG: rate 95  ECHO: 1. Left ventricular ejection fraction, by estimation, is 30 to 35%. The left ventricle has moderate to severely decreased function. The left ventricle demonstrates global hypokinesis. The left ventricular internal  cavity size was mildly dilated. Left ventricular diastolic parameters are consistent with Grade II diastolic  dysfunction (pseudonormalization). Elevated left atrial pressure.  2. Right ventricular systolic function is mildly reduced. The right ventricular size is normal.  3. Left atrial size was mildly dilated.  4. The mitral valve is degenerative. Moderate mitral valve regurgitation.  No evidence of mitral stenosis.  5. The aortic valve is tricuspid. Aortic valve regurgitation is not visualized. Mild aortic valve sclerosis is present, with no evidence of aortic valve stenosis.  6. The inferior vena cava is normal in size with greater than 50% respiratory variability, suggesting right atrial pressure of 3 mmHg.  7. Evidence of atrial level shunting detected by color flow Doppler.    Neuro/Psych negative neurological ROS  negative psych ROS   GI/Hepatic negative GI ROS, Neg liver ROS,   Endo/Other  diabetes, Insulin Dependent  Renal/GU negative Renal ROS     Musculoskeletal negative musculoskeletal ROS (+)   Abdominal   Peds   Hematology  (+) anemia , HLD   Anesthesia Other Findings CAD  CHF  Reproductive/Obstetrics                            Anesthesia Physical Anesthesia Plan  ASA: 4  Anesthesia Plan: General   Post-op Pain Management:    Induction: Intravenous  PONV Risk Score and Plan: 1 and Ondansetron, Dexamethasone, Midazolam and Treatment may vary due to age or medical condition  Airway Management Planned: Oral ETT  Additional Equipment: Arterial line, CVP, PA Cath, TEE and Ultrasound Guidance Line Placement  Intra-op Plan:   Post-operative Plan: Post-operative intubation/ventilation  Informed Consent: I have reviewed the patients History and Physical, chart, labs and discussed the procedure including the risks, benefits and alternatives for the proposed anesthesia with the patient or authorized representative who has indicated his/her understanding and acceptance.     Interpreter used for [employer] and Sales promotion account executive given  Plan Discussed with: CRNA and Surgeon  Anesthesia Plan Comments:        Anesthesia Quick Evaluation

## 2020-08-23 NOTE — Anesthesia Procedure Notes (Signed)
Procedure Name: Intubation Date/Time: 08/23/2020 12:38 PM Performed by: Babs Bertin, CRNA Pre-anesthesia Checklist: Patient identified, Emergency Drugs available, Suction available and Patient being monitored Patient Re-evaluated:Patient Re-evaluated prior to induction Oxygen Delivery Method: Circle System Utilized Preoxygenation: Pre-oxygenation with 100% oxygen Induction Type: IV induction Ventilation: Mask ventilation without difficulty Laryngoscope Size: Mac and 3 Grade View: Grade I Tube type: Oral Tube size: 8.0 mm Number of attempts: 1 Airway Equipment and Method: Stylet and Oral airway Placement Confirmation: ETT inserted through vocal cords under direct vision, positive ETCO2 and breath sounds checked- equal and bilateral Secured at: 23 cm Tube secured with: Tape Dental Injury: Teeth and Oropharynx as per pre-operative assessment

## 2020-08-23 NOTE — Brief Op Note (Addendum)
08/20/2020 - 08/23/2020  3:39 PM  PATIENT:  Colin [name]  70 y.o. male  PRE-OPERATIVE DIAGNOSIS:  CORONARY ARTERY DISEASE  POST-OPERATIVE DIAGNOSIS:  CORONARY ARTERY DISEASE   PROCEDURE:  TRANSESOPHAGEAL ECHOCARDIOGRAM (TEE), CORONARY ARTERY BYPASS GRAFTING (CABG) x 3 (LIMA to LAD, SVG to OM, SVG to PDA)ON CARDIOPULMONARY BYPASS USING AND LEFT INTERNAL MAMMARY ARTERY and LEFT GREATER SAPHENOUS VEIN HARVEST EVH and PARTIALLY OPEN,APPLICATION OF CELL SAVER  EVH/PARTIALLY OPEN LEFT SVG HARVEST TIME: 31 minutes; EVH/PARTIALLY OPEN LEFT SVG PREP TIME: 18 minutes  SURGEON:  Surgeon(s) and Role:    Lightfoot, Eliezer Lofts, MD - Primary  PHYSICIAN ASSISTANT: Doree Fudge PA-C  ASSISTANTS: Mardi Mainland RNFA   ANESTHESIA:   general  DRAINS:  Chest tubes placed in the mediastinal and pleural spaces    COUNTS CORRECT:  YES  DICTATION: .Dragon Dictation  PLAN OF CARE: Admit to inpatient   PATIENT DISPOSITION:  ICU - intubated and hemodynamically stable.   Delay start of Pharmacological VTE agent (>24hrs) due to surgical blood loss or risk of bleeding: yes  BASELINE WEIGHT: 58.3 kg

## 2020-08-23 NOTE — Op Note (Signed)
[address]       [place] [address]             [phone]                                          08/23/2020 Patient:  Colin [name] Pre-Op Dx: NSTEMI 3V CAD Congestive Heart Failure DM HTN   Post-op Dx:  same Procedure: CABG X 3.  LIMA LAD, RSVG PDA, OM1   Endoscopic greater saphenous vein harvest on the right   Surgeon and Role:      * Tranquilino Fischler, Eliezer Lofts, MD - Primary    Jacques Earthly, PA-C - assisting  Anesthesia  general EBL:  Blood Administration: 1 unit of pRBCs   Drains: 19 F blake drain: L, mediastinal  Wires: ventricular Counts: correct   Indications: This is a 70 year old gentleman that presents to the emergency department for chest pain and exertional dyspnea.  Labs are consistent for troponin leak.  He underwent a left heart cath which showed severe three-vessel coronary disease.  His vessels are quite small but distally there is not significant calcifications.  On review of his echocardiogram his LV function is decreased but his RV function appears preserved, additionally he has moderate mitral valve regurgitation which appears central.  There is no aortic valve disease.   He is scheduled for CABG on 08/23/2020.  Given his heart function and the appearance of the mitral valve regurgitation we would not repair this at the time of surgery.  Was likely ischemic and will likely improve.  Findings: Mild MR on pre-op TEE.  Small LIMA.  Small vein.  Good LAD, PDA, and OM targets.  Good flows on vein grafts.  Improved function on post CPB TEE.  Operative Technique: All invasive lines were placed in pre-op holding.  After the risks, benefits and alternatives were thoroughly discussed, the patient was brought to the operative theatre.  Anesthesia was induced, and the patient was prepped and draped in normal sterile fashion.  An appropriate surgical pause was performed, and pre-operative antibiotics were dosed accordingly.  We began  with simultaneous incisions along the right leg for harvesting of the greater saphenous vein and the chest for the sternotomy.  In regards to the sternotomy, this was carried down with bovie cautery, and the sternum was divided with a reciprocating saw.  Meticulous hemostasis was obtained.  The left internal thoracic artery was exposed and harvested in in pedicled fashion.  The patient was systemically heparinized, and the artery was divided distally, and placed in a papaverine sponge.    The sternal elevator was removed, and a retractor was placed.  The pericardium was divided in the midline and fashioned into a cradle with pericardial stitches.   After we confirmed an appropriate ACT, the ascending aorta was cannulated in standard fashion.  The right atrial appendage was used for venous cannulation site.  Cardiopulmonary bypass was initiated, and the heart retractor was placed. The cross clamp was applied, and a dose of anterograde cardioplegia was given with good arrest of the heart.  We moved to the posterior wall of the heart, and found a good target on the PDA.  An arteriotomy was made, and the vein graft was anastomosed to it in an end to side fashion.  Next we exposed the lateral wall, and found a good  target on the OM2.  An end to side anastomosis with the vein graft was then created.   Finally, we exposed a good target on the LAD, and fashioned an end to side anastomosis between it and the LITA.  We began to re-warm, and a re-animation dose of cardioplegia was given.  The heart was de-aired, and the cross clamp was removed.  Meticulous hemostasis was obtained.    A partial occludding clamp was then placed on the ascending aorta, and we created an end to side anastomosis between it and the proximal vein grafts.  The proximal sites were marked with rings.  Hemostasis was obtained, and we separated from cardiopulmonary bypass without event.  The heparin was reversed with protamine.  Chest tubes and wires  were placed, and the sternum was re-approximated with sternal wires.  The soft tissue and skin were re-approximated wth absorbable suture.    The patient tolerated the procedure without any immediate complications, and was transferred to the ICU in guarded condition.  Colin [name]

## 2020-08-23 NOTE — Anesthesia Postprocedure Evaluation (Signed)
Anesthesia Post Note  Patient: Cahlil Sattar Putz  Procedure(s) Performed: CORONARY ARTERY BYPASS GRAFTING (CABG) x 3 ON CARDIOPULMONARY BYPASS USING EVH AND PARTIALLY OPEN HARVESTED LEFT GREATER SAPHENOUS VEIN AND LEFT INTERNAL MAMMARY ARTERY.  LIMA TO LAD, SVG TO OM, SVG TO PDA (Chest) TRANSESOPHAGEAL ECHOCARDIOGRAM (TEE) APPLICATION OF CELL SAVER ENDOVEIN HARVEST AND PARTIALLY OPEN GREATER SAPHENOUS VEIN HARVESTING (Left)     Patient location during evaluation: SICU Anesthesia Type: General Level of consciousness: sedated Pain management: pain level controlled Vital Signs Assessment: post-procedure vital signs reviewed and stable Respiratory status: patient remains intubated per anesthesia plan Cardiovascular status: stable Postop Assessment: no apparent nausea or vomiting Anesthetic complications: no   No notable events documented.  Last Vitals:  Vitals:   08/23/20 1815 08/23/20 1830  BP:  (!) 84/64  Pulse: 78 75  Resp: 12 12  Temp: (!) 36.1 C (!) 36.2 C  SpO2: 100% 100%    Last Pain:  Vitals:   08/23/20 1033  TempSrc: Oral  PainSc: 0-No pain                 Yanis Larin P Sahan Pen

## 2020-08-23 NOTE — Transfer of Care (Signed)
Immediate Anesthesia Transfer of Care Note  Patient: Colin [name]  Procedure(s) Performed: CORONARY ARTERY BYPASS GRAFTING (CABG) x 3 ON CARDIOPULMONARY BYPASS USING EVH AND PARTIALLY OPEN HARVESTED LEFT GREATER SAPHENOUS VEIN AND LEFT INTERNAL MAMMARY ARTERY.  LIMA TO LAD, SVG TO OM, SVG TO PDA (Chest) TRANSESOPHAGEAL ECHOCARDIOGRAM (TEE) APPLICATION OF CELL SAVER ENDOVEIN HARVEST AND PARTIALLY OPEN GREATER SAPHENOUS VEIN HARVESTING (Left)  Patient Location: SICU  Anesthesia Type:General  Level of Consciousness: Patient remains intubated per anesthesia plan  Airway & Oxygen Therapy: Patient remains intubated per anesthesia plan and Patient placed on Ventilator (see vital sign flow sheet for setting)  Post-op Assessment: Report given to RN and Post -op Vital signs reviewed and stable  Post vital signs: Reviewed and stable  Last Vitals:  Vitals Value Taken Time  BP 97/60 08/23/20 1734  Temp 36.3 C 08/23/20 1742  Pulse 81 08/23/20 1742  Resp 12 08/23/20 1742  SpO2 98 % 08/23/20 1742  Vitals shown include unvalidated device data.  Last Pain:  Vitals:   08/23/20 1033  TempSrc: Oral  PainSc: 0-No pain         Complications: No notable events documented.

## 2020-08-23 NOTE — Progress Notes (Signed)
     [address]       [place] [address]             [phone]       No events  Vitals:   08/23/20 0600 08/23/20 0700  BP: 124/66 125/89  Pulse: 65 68  Resp: 16 (!) 23  Temp:  97.9 F (36.6 C)  SpO2: 91% 97%   Alert NAD Sinus EWOB  OR today for CABG 3  Elohim Brune O Esma Kilts

## 2020-08-23 NOTE — Progress Notes (Signed)
Advanced Heart Failure Rounding Note  PCP-Cardiologist: None   Subjective:    He is lying in bed in no acute distress.  He complains of right-sided jaw pain above the TTVP site, but reports that his chest pain is "better."    Pre-CABG ultrasounds completed -- revealed 1 to 39% stenosis in the right ICA, 40 to 59% stenosis in the left ICA and mild LLE arterial disease.   He remains on heparin this morning while awaiting CABG today.  Nitro drip off since last night.    Creatinine improved to 1.29 from 1.49 yesterday with urine output of 1.15 liters x24 hours.    Objective:   Weight Range: 58.3 kg Body mass index is 22.06 kg/m.   Vital Signs:   Temp:  [98.2 F (36.8 C)-98.4 F (36.9 C)] 98.2 F (36.8 C) (07/19 0400) Pulse Rate:  [23-83] 73 (07/19 0200) Resp:  [0-25] 17 (07/19 0400) BP: (94-148)/(54-80) 102/64 (07/19 0400) SpO2:  [0 %-100 %] 99 % (07/19 0400) Weight:  [58.3 kg] 58.3 kg (07/19 0500) Last BM Date: 08/19/20  Weight change: Filed Weights   08/21/20 0454 08/22/20 0341 08/23/20 0500  Weight: 62.8 kg 57.7 kg 58.3 kg    Intake/Output:   Intake/Output Summary (Last 24 hours) at 08/23/2020 0633 Last data filed at 08/23/2020 0400 Gross per 24 hour  Intake 95.48 ml  Output 1150 ml  Net -1054.52 ml      Physical Exam    General:  Awake and alert. No resp difficulty HEENT: Normal Neck: Supple. TTVP in place on right side.  No significant JVP noted. No lymphadenopathy or thyromegaly appreciated. Cor: PMI nondisplaced. Regular rate & rhythm. No rubs, gallops or murmurs. Lungs: Clear to auscultation Abdomen: Soft, nontender, nondistended. No hepatosplenomegaly. No bruits or masses. Good bowel sounds. Extremities: No cyanosis, clubbing, rash, edema Neuro: Alert. Cranial nerves grossly intact. moves all 4 extremities w/o difficulty. Affect pleasant.   Language barrier.  Patient speaks a dialect of [place] that is not available via Radiation protection practitioner.      Telemetry   NSR in 60s -- personally reviewed.   EKG      Labs    CBC Recent Labs    08/22/20 0312 08/22/20 1134 08/23/20 0500  WBC 7.2  --  6.5  HGB 11.5* 12.2* 12.1*  HCT 34.5* 36.0* 35.1*  MCV 93.5  --  93.1  PLT 207  --  200   Basic Metabolic Panel Recent Labs    96/04/54 0039 08/22/20 0312 08/22/20 1134  NA 142 141 144  K 3.2* 3.7 3.8  CL 105 107  --   CO2 32 27  --   GLUCOSE 38* 209*  --   BUN 19 32*  --   CREATININE 1.54* 1.49*  --   CALCIUM 9.5 8.7*  --   MG  --  2.0  --    Liver Function Tests No results for input(s): AST, ALT, ALKPHOS, BILITOT, PROT, ALBUMIN in the last 72 hours. No results for input(s): LIPASE, AMYLASE in the last 72 hours. Cardiac Enzymes No results for input(s): CKTOTAL, CKMB, CKMBINDEX, TROPONINI in the last 72 hours.  BNP: BNP (last 3 results) Recent Labs    08/20/20 0449 08/22/20 0312  BNP 1,079.7* 726.1*    ProBNP (last 3 results) No results for input(s): PROBNP in the last 8760 hours.   D-Dimer No results for input(s): DDIMER in the last 72 hours. Hemoglobin A1C No results for input(s): HGBA1C in the last 72  hours. Fasting Lipid Panel Recent Labs    08/21/20 0039  CHOL 187  HDL 40*  LDLCALC 136*  TRIG 53  CHOLHDL 4.7  LDLDIRECT 137.1*   Thyroid Function Tests No results for input(s): TSH, T4TOTAL, T3FREE, THYROIDAB in the last 72 hours.  Invalid input(s): FREET3  Other results:   Imaging    CARDIAC CATHETERIZATION  Addendum Date: 08/22/2020   LM: Minimal disease LAD: Prox-mid long 80% stenosis Lcx: Lcx/OM1 95% ISR RCA: Prox 80% ISR, followed by In-stent CTO mid-distal vessel Left-to-right collaterals to distal vessel Mildly decompensated ischemic cardiomyopathy Temp pacer placed through [facility] Surgical consult for CABG  Result Date: 08/22/2020 LM: Minimal disease LAD: Prox-mid long 80% stenosis Lcx: Lcx/OM1 95% ISR RCA: Prox 80% ISR, followed by In-stent CTO mid-distal vessel Left-to-right  collaterals to distal vessel Mildly decompensated ischemic cardiomyopathy Temp pacer placed through [facility] Surgical consult for CABG   VAS US DOPPLER PRE CABG  Result Date: 08/22/2020 PREOPERATIVE VASCULAR EVALUATION Patient Name:  [name]  Date of Exam:   08/22/2020 Medical Rec #: [id number]     Accession #:    [id number] Date of Birth: May 26, 1950     Patient Gender: M Patient Age:   70Y Exam Location:  [facility] Procedure:      VAS US DOPPLER PRE CABG Referring Phys: [id number] HARRELL O LIGHTFOOT --------------------------------------------------------------------------------  Indications:      Pre-CABG. Risk Factors:     Hypertension, hyperlipidemia, Diabetes, coronary artery                   disease. Limitations:      Line in right neck/bandaging, Left TR band Comparison Study: No prior study Performing Technologist: Olen Cordial RVT  Examination Guidelines: A complete evaluation includes B-mode imaging, spectral Doppler, color Doppler, and power Doppler as needed of all accessible portions of each vessel. Bilateral testing is considered an integral part of a complete examination. Limited examinations for reoccurring indications may be performed as noted.  Right Carotid Findings: +----------+--------+--------+--------+--------+-------------------------------+           PSV cm/sEDV cm/sStenosisDescribeComments                        +----------+--------+--------+--------+--------+-------------------------------+ CCA Prox                                  Unable to visualize due to                                                line/bandaging                  +----------+--------+--------+--------+--------+-------------------------------+ CCA Distal                                Unable to visualize due to                                                line/bandaging                  +----------+--------+--------+--------+--------+-------------------------------+  ICA Prox  71  29                                                      +----------+--------+--------+--------+--------+-------------------------------+ ICA Distal84      28                                                      +----------+--------+--------+--------+--------+-------------------------------+ ECA                                       Unable to visualize due to                                                line/bandaging                  +----------+--------+--------+--------+--------+-------------------------------+ Portions of this table do not appear on this page. +----------+-------+-------+--------------------------------------+------------+           PSV    EDV cmsDescribe                              Arm Pressure           cm/s                                                             +----------+-------+-------+--------------------------------------+------------+ Subclavian              Unable to visualize due to                                                 line/bandaging                                     +----------+-------+-------+--------------------------------------+------------+ +---------+--------+--+--------+--+---------+ VertebralPSV cm/s73EDV cm/s23Antegrade +---------+--------+--+--------+--+---------+ Left Carotid Findings: +----------+--------+--------+--------+-------------------------------+--------+           PSV cm/sEDV cm/sStenosisDescribe                       Comments +----------+--------+--------+--------+-------------------------------+--------+ CCA Prox  63      18                                                      +----------+--------+--------+--------+-------------------------------+--------+ CCA Distal70      20              heterogenous                            +----------+--------+--------+--------+-------------------------------+--------+  ICA Prox  113     40      40-59%   calcific, hypoechoic and                                                  heterogenous                            +----------+--------+--------+--------+-------------------------------+--------+ ICA Mid   103     31                                                      +----------+--------+--------+--------+-------------------------------+--------+ ICA Distal100     32                                                      +----------+--------+--------+--------+-------------------------------+--------+ ECA       123     7                                                       +----------+--------+--------+--------+-------------------------------+--------+ +----------+--------+--------+----------------+------------+ SubclavianPSV cm/sEDV cm/sDescribe        Arm Pressure +----------+--------+--------+----------------+------------+           125             Multiphasic, WNL             +----------+--------+--------+----------------+------------+ +---------+--------+--+--------+--+---------+ VertebralPSV cm/s52EDV cm/s12Antegrade +---------+--------+--+--------+--+---------+  ABI Findings: +--------+------------------+-----+---------+--------+ Right   Rt Pressure (mmHg)IndexWaveform Comment  +--------+------------------+-----+---------+--------+ [id number]                    triphasic         +--------+------------------+-----+---------+--------+ PTA     124               0.89 triphasic         +--------+------------------+-----+---------+--------+ DP      138               0.99 triphasic         +--------+------------------+-----+---------+--------+ +----+------------------+-----+---------+-------+ LeftLt Pressure (mmHg)IndexWaveform Comment +----+------------------+-----+---------+-------+ PTA 106               0.76 triphasic        +----+------------------+-----+---------+-------+ DP  120               0.86 triphasic         +----+------------------+-----+---------+-------+ +-------+---------------+----------------+ ABI/TBIToday's ABI/TBIPrevious ABI/TBI +-------+---------------+----------------+ Right  0.99                            +-------+---------------+----------------+ Left   0.86                            +-------+---------------+----------------+  Right Doppler Findings: +--------+--------+-----+---------+--------+ Site    PressureIndexDoppler  Comments +--------+--------+-----+---------+--------+ [id number]  triphasic         +--------+--------+-----+---------+--------+ Radial               triphasic         +--------+--------+-----+---------+--------+ Ulnar                triphasic         +--------+--------+-----+---------+--------+   Summary: Right Carotid: Limited evaluation secondary to line/bandaging. Velocities in the                right ICA are consistent with a 1-39% stenosis. Left Carotid: Velocities in the left ICA are consistent with a 40-59% stenosis,               lower range of scale. Vertebrals:  Bilateral vertebral arteries demonstrate antegrade flow. Subclavians: Right subclavian artery was not visualized. Normal flow              hemodynamics were seen in the left subclavian artery. Right ABI: Resting right ankle-brachial index is within normal range. No evidence of significant right lower extremity arterial disease. Left ABI: Resting left ankle-brachial index indicates mild left lower extremity arterial disease. Right Upper Extremity: Doppler waveforms remain within normal limits with right radial compression. Doppler waveforms remain within normal limits with right ulnar compression.  Electronically signed by Fabienne Bruns MD on 08/22/2020 at 7:50:23 PM.    Final      Medications:     Scheduled Medications:  aspirin EC  81 mg Oral Daily   chlorhexidine  15 mL Mouth/Throat Once   Chlorhexidine Gluconate Cloth  6 each Topical Daily   Chlorhexidine  Gluconate Cloth  6 each Topical Once   dapagliflozin propanediol  10 mg Oral Daily   docusate sodium  100 mg Oral BID   epinephrine  0-10 mcg/min Intravenous To OR   feeding supplement (GLUCERNA SHAKE)  237 mL Oral TID BM   gabapentin  100 mg Oral TID   heparin-papaverine-plasmalyte irrigation   Irrigation To OR   insulin aspart  0-15 Units Subcutaneous TID WC   insulin aspart  0-5 Units Subcutaneous QHS   insulin glargine  10 Units Subcutaneous Daily   insulin   Intravenous To OR   isosorbide mononitrate  30 mg Oral Daily   [facility] Blood Cardioplegia vial (lidocaine/magnesium/mannitol 0.26g-4g-6.4g)   Intracoronary Once   metoprolol tartrate  12.5 mg Oral Once   multivitamin with minerals  1 tablet Oral Daily   phenylephrine  30-200 mcg/min Intravenous To OR   potassium chloride  80 mEq Other To OR   rosuvastatin  40 mg Oral QHS   sodium chloride flush  3 mL Intravenous Q12H   tranexamic acid  15 mg/kg Intravenous To OR   tranexamic acid  2 mg/kg Intracatheter To OR    Infusions:  sodium chloride      ceFAZolin (ANCEF) IV      ceFAZolin (ANCEF) IV     dexmedetomidine     heparin 30,000 units/NS 1000 mL solution for CELLSAVER     heparin 1,100 Units/hr (08/23/20 0400)   milrinone     nitroGLYCERIN Stopped (08/22/20 1213)   nitroGLYCERIN     norepinephrine     tranexamic acid (CYKLOKAPRON) infusion ([facility])     vancomycin      PRN Medications: sodium chloride, acetaminophen **OR** acetaminophen, bisacodyl, hydrALAZINE, morphine injection, ondansetron **OR** ondansetron (ZOFRAN) IV, oxyCODONE, polyethylene glycol, sodium chloride flush, traZODone    Patient Profile   The patient is a 70 y/o Asian male  with a medical history significant for hypertension, hyperlipidemia and diabetes who presented to [facility] for evaluation of chest pain.  He ruled in for an NSTEMI and underwent a cardiac catheterization that revealed multivessel CAD.  He will undergo a CABG on 08/23/2020.    Assessment/Plan   1. CAD:  Remote PCI to LCx and RCA.  NSTEMI this admission, found to have 3VD with occluded RCA with collaterals, 80% long proximal LAD stenosis, 90% stenosis proximal LCx and OM1 (relatively small caliber).  He is a diabetic with low EF.  Echo shows diffuse moderate hypokinesis, there is no thinning or akinesis, so suspect significant viability. - CABG scheduled for 08/23/2020. Further recommendations pending outcome - Beta blocker held due to long pause -- will continue to hold until appropriate post-operative period  2. Acute systolic CHF: Echo with EF 30-35%, normal RV, moderate MR.  Ischemic cardiomyopathy.  He is not volume overloaded by exam and RHC on 08/22/2020 showed near-normal filling pressures with preserved cardiac output. - Would hold off on ARB/ARNI immediately pre-CABG, would like to get on Entresto postoperatively  - Creatinine improving (1.29 today from 1.49) -- can consider starting spironolactone postoperatively when appropriate  - Hold off on beta blocker for now with long sinus pause. - He can continue dapagliflozin - Do not think he will need MV repair, suspect functional and will improve with improvement in LV function.  3. Sinus pause: 5 second pause on 7/18 and now with TTVP.  Rhythm appears stable currently with no further pauses noted.  Pacing wires will be in place postoperatively in place of TTVP  4. Type 2 diabetes: Insulin. 5. 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  Also with ulcerated plaque in infrarenal aorta. - Pre-CABG ultrasounds as noted above  6. Smoking: Urged him to quit.   Length of Stay: 3  Peter Garter, NP  08/23/2020, 6:33 AM  Patient seen with NP, agree with the above note.   He is stable this morning, no chest pain.  Heparin gtt running.  He has been in NSR without any appreciable pacing.  Creatinine improved.   General: NAD Neck: No JVD, no thyromegaly or thyroid nodule.   Lungs: Clear to auscultation bilaterally with normal respiratory effort. CV: Nondisplaced PMI.  Heart regular S1/S2, no S3/S4, no murmur.  No peripheral edema.   Abdomen: Soft, nontender, no hepatosplenomegaly, no distention.  Skin: Intact without lesions or rashes.  Neurologic: Alert and oriented x 3.  Psych: Normal affect. Extremities: No clubbing or cyanosis.  HEENT: Normal.   Plan for OR today with Dr. Cliffton Asters for CABG.  Do not think he will need MV repair.  Should be able to remove TTVP as he will have surgical pacing wires.  Will need to watch closely post-operatively for need for PPM.   Marca Ancona 08/23/2020 7:34 AM

## 2020-08-23 NOTE — Anesthesia Procedure Notes (Signed)
Central Venous Catheter Insertion Performed by: Leonides Grills, MD, anesthesiologist Start/End7/19/2022 10:45 AM, 08/23/2020 11:00 AM Patient location: Pre-op. Preanesthetic checklist: patient identified, IV checked, site marked, risks and benefits discussed, surgical consent, monitors and equipment checked, pre-op evaluation, timeout performed and anesthesia consent Position: Trendelenburg Lidocaine 1% used for infiltration and patient sedated Hand hygiene performed , maximum sterile barriers used  and Seldinger technique used Catheter size: 8.5 Fr Total catheter length 10. Sheath introducer Procedure performed using ultrasound guided technique. Ultrasound Notes:anatomy identified, needle tip was noted to be adjacent to the nerve/plexus identified, no ultrasound evidence of intravascular and/or intraneural injection and image(s) printed for medical record Attempts: 1 Following insertion, line sutured, dressing applied and Biopatch. Post procedure assessment: blood return through all ports, free fluid flow and no air  Patient tolerated the procedure well with no immediate complications.

## 2020-08-23 NOTE — Progress Notes (Signed)
  Echocardiogram Echocardiogram Transesophageal has been performed.  Gerda Diss 08/23/2020, 1:30 PM

## 2020-08-23 NOTE — Discharge Instructions (Addendum)
Discharge Instructions:  1. You may shower, please wash incisions daily with soap and water and keep dry.  If you wish to cover wounds with dressing you may do so but please keep clean and change daily.  No tub baths or swimming until incisions have completely healed.  If your incisions become red or develop any drainage please call our office at [phone]  2. No Driving until cleared by Dr. Lightfoot's office and you are no longer using narcotic pain medications  3. Monitor your weight daily.. Please use the same scale and weigh at same time... If you gain 3-5 lbs in 48 hours with associated lower extremity swelling, please contact our office at [phone]  4. Fever of 101.5 for at least 24 hours with no source, please contact our office at [phone]  5. Activity- up as tolerated, please walk at least 3 times per day.  Avoid strenuous activity, no lifting, pushing, or pulling with your arms over 8-10 lbs for a minimum of 6 weeks  6. If any questions or concerns arise, please do not hesitate to contact our office at [phone] 

## 2020-08-23 NOTE — Progress Notes (Addendum)
Subjective:  [place] interpretor used. [place] interpretor not available  No chest pain Says, "You do whatever you need to do"  No pauses overnight  Objective:  Vital Signs in the last 24 hours: Temp:  [98.2 F (36.8 C)-98.4 F (36.9 C)] 98.2 F (36.8 C) (07/19 0400) Pulse Rate:  [23-83] 68 (07/19 0700) Resp:  [0-25] 23 (07/19 0700) BP: (94-148)/(54-89) 125/89 (07/19 0700) SpO2:  [0 %-100 %] 97 % (07/19 0700) Weight:  [58.3 kg] 58.3 kg (07/19 0500)  Intake/Output from previous day: 07/18 0701 - 07/19 0700 In: 117.5 [I.V.:117.5] Out: 1550 [Urine:1550]  Physical Exam Vitals and nursing note reviewed.  Constitutional:      General: He is not in acute distress.    Appearance: He is well-developed.  HENT:     Head: Normocephalic and atraumatic.  Eyes:     Conjunctiva/sclera: Conjunctivae normal.     Pupils: Pupils are equal, round, and reactive to light.  Neck:     Vascular: No JVD.  Cardiovascular:     Rate and Rhythm: Normal rate and regular rhythm.     Pulses: Normal pulses and intact distal pulses.     Heart sounds: No murmur heard.    Comments: Temp pacemaker in place through RIJ Pulmonary:     Effort: Pulmonary effort is normal.     Breath sounds: Normal breath sounds. No wheezing or rales.  Abdominal:     General: Bowel sounds are normal.     Palpations: Abdomen is soft.     Tenderness: There is no rebound.  Musculoskeletal:        General: No tenderness. Normal range of motion.     Right lower leg: No edema.     Left lower leg: No edema.  Lymphadenopathy:     Cervical: No cervical adenopathy.  Skin:    General: Skin is warm and dry.  Neurological:     Mental Status: He is alert and oriented to person, place, and time.     Cranial Nerves: No cranial nerve deficit.     Lab Results: BMP Recent Labs    09/30/19 1627 12/17/19 1101 05/06/20 1351 08/21/20 0039 08/22/20 0312 08/22/20 1134 08/23/20 0500  NA 133* 142   < > 142 141 144 139  K  4.0 4.7   < > 3.2* 3.7 3.8 4.3  CL 96* 104   < > 105 107  --  108  CO2 25 27   < > 32 27  --  28  GLUCOSE 484* 112*   < > 38* 209*  --  222*  BUN 34* 23   < > 19 32*  --  34*  CREATININE 1.44* 1.15   < > 1.54* 1.49*  --  1.29*  CALCIUM 9.5 9.5   < > 9.5 8.7*  --  8.5*  GFRNONAA 49* 65   < > 48* 50*  --  60*  GFRAA 57* 75  --   --   --   --   --    < > = values in this interval not displayed.    CBC Recent Labs  Lab 08/23/20 0500  WBC 6.5  RBC 3.77*  HGB 12.1*  HCT 35.1*  PLT 200  MCV 93.1  MCH 32.1  MCHC 34.5  RDW 12.9    HEMOGLOBIN A1C Lab Results  Component Value Date   HGBA1C 9.4 (H) 08/23/2020   MPG 223.08 08/23/2020    Cardiac Panel (last 3 results) No results for input(s): CKTOTAL,  CKMB, TROPONINI, RELINDX in the last 8760 hours.  BNP (last 3 results) Recent Labs    08/20/20 0449 08/22/20 0312  BNP 1,079.7* 726.1*    TSH No results for input(s): TSH in the last 8760 hours.  Lipid Panel     Component Value Date/Time   CHOL 187 08/21/2020 0039   CHOL 171 12/17/2019 1101   TRIG 53 08/21/2020 0039   HDL 40 (L) 08/21/2020 0039   HDL 41 12/17/2019 1101   CHOLHDL 4.7 08/21/2020 0039   VLDL 11 08/21/2020 0039   LDLCALC 136 (H) 08/21/2020 0039   LDLCALC 114 (H) 12/17/2019 1101   LDLDIRECT 137.1 (H) 08/21/2020 0039     Hepatic Function Panel Recent Labs    12/17/19 1101  PROT 6.8  ALBUMIN 4.0  AST 12  ALT 23  ALKPHOS 82  BILITOT <0.2    Imaging: Chest Xray pending  Cardiac Studies:  EKG 08/22/2020: Sinus rhythm Old anteroseptal infarct Inferolateral T wave inversion  LHC/RHC/Temp pacemaker placement 08/23/2020: LM: Minimal disease LAD: Prox-mid long 80% stenosis Lcx: Lcx/OM1 95% ISR RCA: Prox 80% ISR, followed by In-stent CTO mid-distal vessel Left-to-right collaterals to distal vessel Mildly decompensated ischemic cardiomyopathy Temp pacer placed through RIJ   Surgical consult for CABG    Echocardiogram 08/20/2020:  1. Left  ventricular ejection fraction, by estimation, is 30 to 35%. The  left ventricle has moderate to severely decreased function. The left  ventricle demonstrates global hypokinesis. The left ventricular internal  cavity size was mildly dilated. Left  ventricular diastolic parameters are consistent with Grade II diastolic  dysfunction (pseudonormalization). Elevated left atrial pressure.   2. Right ventricular systolic function is mildly reduced. The right  ventricular size is normal.   3. Left atrial size was mildly dilated.   4. The mitral valve is degenerative. Moderate mitral valve regurgitation.  No evidence of mitral stenosis.   5. The aortic valve is tricuspid. Aortic valve regurgitation is not  visualized. Mild aortic valve sclerosis is present, with no evidence of  aortic valve stenosis.   6. The inferior vena cava is normal in size with greater than 50%  respiratory variability, suggesting right atrial pressure of 3 mmHg.   7. Evidence of atrial level shunting detected by color flow Doppler.   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 Good surgical targets. Plan for CABG today. Continue Aspirin, heparin, statin Not on beta blocker due to symptomatic sinus pause  HFrEF: Euvolumic. BB/ARB/ARNI after surgery  Symptomatic sinus pause: Has RIJ temp wire. Did not need pacing overnight  DM: Lantus 60 U + 0-15 U short acting +  Farxiga Endotool for surgery   CRITICAL CARE Performed by: Truett Mainland   Total critical care time: 37 minutes   Critical care time was exclusive of separately billable procedures and treating other patients.   Critical care was necessary to treat or prevent imminent or  life-threatening deterioration.   Critical care was time spent personally by me on the following activities: development of treatment plan with patient and/or surrogate as well as nursing, discussions with consultants, evaluation of patient's response to treatment, examination of patient, obtaining history from patient or surrogate, ordering and performing treatments and interventions, ordering and review of laboratory studies, ordering and review of radiographic studies, pulse oximetry  and re-evaluation of patient's condition.      [name], MD Pager: [phone] Office: [phone]

## 2020-08-23 NOTE — Anesthesia Procedure Notes (Addendum)
Arterial Line Insertion Performed by: Sheppard Evens, CRNA, CRNA  Preanesthetic checklist: patient identified, IV checked, surgical consent, monitors and equipment checked and pre-op evaluation Lidocaine 1% used for infiltration and patient sedated Left, radial was placed Catheter size: 20 G Hand hygiene performed  and maximum sterile barriers used   Attempts: 1 Procedure performed without using ultrasound guided technique. Ultrasound Notes:anatomy identified Following insertion, dressing applied and Biopatch.

## 2020-08-23 NOTE — Procedures (Signed)
Extubation Procedure Note  Patient Details:   Name: Colin [name] DOB: 1950-09-22 MRN: [id number]   Airway Documentation:  Airway 8 mm (Active)  Secured at (cm) 23 cm 08/23/20 2100  Measured From Lips 08/23/20 2100  Secured Location Right 08/23/20 2100  Secured By [employer] 08/23/20 2100  Tube Holder Repositioned Yes 08/23/20 2100  Prone position No 08/23/20 1935  Cuff Pressure (cm H2O) Green OR 18-26 CmH2O 08/23/20 1935  Site Condition Dry 08/23/20 1935   Vent end date: (not recorded) Vent end time: (not recorded)   Evaluation  O2 sats: stable throughout Complications: No apparent complications Patient did tolerate procedure well. Bilateral Breath Sounds: Clear, Diminished   Patient NIF -20, VC 710.  Patient son assisted with interpretation.  Patient able to vocalize.  Placed on 4L [place].    Donnelly Angelica 08/23/2020, 11:40 PM

## 2020-08-23 NOTE — Progress Notes (Signed)
PROGRESS NOTE    Colin [name]  MEQ:[id number] DOB: 08-Aug-1950 DOA: 08/20/2020 PCP: Colin Sessions, NP    Brief Narrative:  Colin [name] was admitted to the hospital with the working diagnosis of Non STEMI, complicated with acute systolic heart failure and AKI.    70 year old male past medical history for hypertension, dyslipidemia, type 2 diabetes mellitus who presented with chest pain and dyspnea.  Reported 24 hours of weakness, dyspnea and lower extremity edema.  Worse symptoms with exertion.  Limited history due to language barriers.  On his initial physical examination blood pressure 102/50, heart rate 82, respiratory rate 18-21, oxygen saturation 94%, his lungs are clear to auscultation bilaterally, heart S1-S2, present, rhythmic, abdomen soft, positive lower extremity edema.   Sodium 138, potassium 3.9, chloride 107, bicarb 26, glucose 350, BUN 17, creatinine 1.19, BNP 1079, high sensitive troponin 208, 197, 801, 970, white count 10.5, hemoglobin 13.4, hematocrit 39.3, platelets 227. SARS COVID-19 negative.   Chest radiograph, personally reviewed, hyperinflation, bilateral reticular infiltrates diffusely, no frank pleural effusions, fluid in the right fissure.  Cervical hyperextension.   EKG 93 bpm, normal axis, normal intervals, sinus rhythm, Q-wave V1-V3, ST depressions, T wave inversions V5 V6, LVH.   Patient has been placed on IV heparin and furosemide for diuresis.   07/ 18 Cardiac cath with coronary angiography, positive for triple vessel disease. Plan for Coronary artery bypass grafting today.    Assessment & Plan:   Principal Problem:   New onset of congestive heart failure (HCC) Active Problems:   Dyslipidemia   Essential hypertension   NSTEMI (non-ST elevated myocardial infarction) (HCC)   DM type 2 with diabetic mixed hyperlipidemia (HCC)   Smoking   HFrEF (heart failure with reduced ejection fraction) (HCC)   Sinus pause    NSTMI, sinus pause 5 seconds.   Patient found to have 3 vessel coronary artery diease, CT surgery and heart failure team consulted. Decision was made to proceed with CABG for revascularization.    Medical therapy with, IV heparin and nitroglycerin drip. On rosuvastatin and aspirin Continue to hold on B blocker and ACE inh to prevent bradycardia, hypotension or worsening renal function. Post cath had a temporary pacer placed.    Follow up with cath results.    2. AKI, hypokalemia.  Renal function today with serum cr at 1,29 with K at 4,3 and serum bicarbonate at 28. Patient has been NPO past midnight. Follow up renal function post procedure.    3. Acute systolic heart failure. Echocardiogram with LV EF 30 to 35% with moderate to severe decreased systolic function, global hypokinesis, mild reduction in RV systolic function, moderate mitral valve regurgitation.   On dapagliflozin Improved volume status after initial diuresis.  Plan for revascularization today.    4. HTN. Close blood pressure monitoring.   5. T2Dm/ hypoglycemia. Fasting glucose is up to 209 with capillary 189. Fasting glucose this am 222 mg/dl  Continue with basal insulin plus sliding scale. Follow up glucose post cardiac surgery per protocol.     6. Tobacco abuse. Nicotine patch, smoking cessation    Patient continue to be at high risk for worsening ischemic heart disease.   Status is: Inpatient  Remains inpatient appropriate because:Inpatient level of care appropriate due to severity of illness  Dispo: The patient is from: Home              Anticipated d/c is to: Home  Patient currently is not medically stable to d/c.   Difficult to place patient No   DVT prophylaxis: Heparin drip   Code Status:   full  Family Communication:  No family at the bedside      Nutrition Status: Nutrition Problem: Increased nutrient needs Etiology: chronic illness (CHF) Signs/Symptoms: estimated needs Interventions: Ensure Enlive (each  supplement provides 350kcal and 20 grams of protein), MVI    Consultants:  Cardiology  Heart Failure team CT surgery   Procedures:  Cardiac cath  Temporary pacer      Subjective: Patient this am feeling tired, no nausea or vomiting, no frank dyspnea or chest pain.,   Objective: Vitals:   08/23/20 0400 08/23/20 0500 08/23/20 0600 08/23/20 0700  BP: 102/64 121/72 124/66 125/89  Pulse:   65 68  Resp: 17 17 16  (!) 23  Temp: 98.2 F (36.8 C)   97.9 F (36.6 C)  TempSrc: Oral   Oral  SpO2: 99%  91% 97%  Weight:  58.3 kg    Height:        Intake/Output Summary (Last 24 hours) at 08/23/2020 1021 Last data filed at 08/23/2020 0600 Gross per 24 hour  Intake 117.5 ml  Output 1550 ml  Net -1432.5 ml   Filed Weights   08/21/20 0454 08/22/20 0341 08/23/20 0500  Weight: 62.8 kg 57.7 kg 58.3 kg    Examination:   General: Not in pain or dyspnea  Neurology: Awake and alert, non focal  E ENT: mild pallor, no icterus, oral mucosa moist Cardiovascular: No JVD. S1-S2 present, rhythmic, no gallops, rubs, or murmurs. No lower extremity edema. Pulmonary: positive breath sounds bilaterally, adequate air movement, no wheezing, rhonchi or rales. Gastrointestinal. Abdomen soft and non tender Skin. No rashes Musculoskeletal: no joint deformities     Data Reviewed: I have personally reviewed following labs and imaging studies  CBC: Recent Labs  Lab 08/20/20 0449 08/21/20 0039 08/22/20 0312 08/22/20 1134 08/23/20 0500 08/23/20 0838  WBC 10.5 8.5 7.2  --  6.5  --   HGB 13.4 13.0 11.5* 12.2* 12.1* 11.2*  HCT 39.3 39.0 34.5* 36.0* 35.1* 33.0*  MCV 92.0 92.9 93.5  --  93.1  --   PLT 227 238 207  --  200  --    Basic Metabolic Panel: Recent Labs  Lab 08/20/20 0449 08/21/20 0039 08/22/20 0312 08/22/20 1134 08/23/20 0500 08/23/20 0838  NA 138 142 141 144 139 143  K 3.9 3.2* 3.7 3.8 4.3 4.1  CL 107 105 107  --  108  --   CO2 26 32 27  --  28  --   GLUCOSE 350* 38* 209*   --  222*  --   BUN 17 19 32*  --  34*  --   CREATININE 1.19 1.54* 1.49*  --  1.29*  --   CALCIUM 9.6 9.5 8.7*  --  8.5*  --   MG  --   --  2.0  --  2.1  --    GFR: Estimated Creatinine Clearance: 43.9 mL/min (A) (by C-G formula based on SCr of 1.29 mg/dL (H)). Liver Function Tests: No results for input(s): AST, ALT, ALKPHOS, BILITOT, PROT, ALBUMIN in the last 168 hours. No results for input(s): LIPASE, AMYLASE in the last 168 hours. No results for input(s): AMMONIA in the last 168 hours. Coagulation Profile: Recent Labs  Lab 08/20/20 0449 08/23/20 0500  INR 1.0 1.1   Cardiac Enzymes: No results for input(s): CKTOTAL, CKMB, CKMBINDEX, TROPONINI  in the last 168 hours. BNP (last 3 results) No results for input(s): PROBNP in the last 8760 hours. HbA1C: Recent Labs    08/23/20 0720  HGBA1C 9.4*   CBG: Recent Labs  Lab 08/21/20 2144 08/22/20 0348 08/22/20 1630 08/22/20 2133 08/23/20 0635  GLUCAP 245* 189* 237* 242* 207*   Lipid Profile: Recent Labs    08/21/20 0039  CHOL 187  HDL 40*  LDLCALC 136*  TRIG 53  CHOLHDL 4.7  LDLDIRECT 137.1*   Thyroid Function Tests: No results for input(s): TSH, T4TOTAL, FREET4, T3FREE, THYROIDAB in the last 72 hours. Anemia Panel: No results for input(s): VITAMINB12, FOLATE, FERRITIN, TIBC, IRON, RETICCTPCT in the last 72 hours.    Radiology Studies: I have reviewed all of the imaging during this hospital visit personally     Scheduled Meds:  [MAR Hold] aspirin EC  81 mg Oral Daily   chlorhexidine  15 mL Mouth/Throat Once   Or   mouth rinse  15 mL Mouth Rinse Once   chlorhexidine       [MAR Hold] Chlorhexidine Gluconate Cloth  6 each Topical Daily   [MAR Hold] dapagliflozin propanediol  10 mg Oral Daily   [MAR Hold] docusate sodium  100 mg Oral BID   epinephrine  0-10 mcg/min Intravenous To OR   [MAR Hold] feeding supplement (GLUCERNA SHAKE)  237 mL Oral TID BM   [MAR Hold] gabapentin  100 mg Oral TID    heparin-papaverine-plasmalyte irrigation   Irrigation To OR   [MAR Hold] insulin aspart  0-15 Units Subcutaneous TID WC   [MAR Hold] insulin aspart  0-5 Units Subcutaneous QHS   [MAR Hold] insulin glargine  10 Units Subcutaneous Daily   insulin   Intravenous To OR   [MAR Hold] isosorbide mononitrate  30 mg Oral Daily   [place] Blood Cardioplegia vial (lidocaine/magnesium/mannitol 0.26g-4g-6.4g)   Intracoronary Once   [MAR Hold] multivitamin with minerals  1 tablet Oral Daily   phenylephrine  30-200 mcg/min Intravenous To OR   potassium chloride  80 mEq Other To OR   [MAR Hold] rosuvastatin  40 mg Oral QHS   [MAR Hold] sodium chloride flush  3 mL Intravenous Q12H   tranexamic acid  15 mg/kg Intravenous To OR   tranexamic acid  2 mg/kg Intracatheter To OR   Continuous Infusions:  [MAR Hold] sodium chloride      ceFAZolin (ANCEF) IV      ceFAZolin (ANCEF) IV     dexmedetomidine     heparin 30,000 units/NS 1000 mL solution for CELLSAVER     heparin 1,200 Units/hr (08/23/20 0754)   lactated ringers     milrinone     nitroGLYCERIN Stopped (08/22/20 1213)   nitroGLYCERIN     norepinephrine     tranexamic acid (CYKLOKAPRON) infusion ([facility])     vancomycin       LOS: 3 days        Colin [name] Annett Gula, MD

## 2020-08-24 ENCOUNTER — Inpatient Hospital Stay (HOSPITAL_COMMUNITY): Payer: Medicare HMO

## 2020-08-24 ENCOUNTER — Encounter (HOSPITAL_COMMUNITY): Payer: Self-pay | Admitting: Thoracic Surgery (Cardiothoracic Vascular Surgery)

## 2020-08-24 DIAGNOSIS — I5021 Acute systolic (congestive) heart failure: Secondary | ICD-10-CM | POA: Diagnosis not present

## 2020-08-24 DIAGNOSIS — Z951 Presence of aortocoronary bypass graft: Secondary | ICD-10-CM

## 2020-08-24 LAB — BASIC METABOLIC PANEL
Anion gap: 5 (ref 5–15)
Anion gap: 6 (ref 5–15)
BUN: 23 mg/dL (ref 8–23)
BUN: 27 mg/dL — ABNORMAL HIGH (ref 8–23)
CO2: 23 mmol/L (ref 22–32)
CO2: 25 mmol/L (ref 22–32)
Calcium: 8.1 mg/dL — ABNORMAL LOW (ref 8.9–10.3)
Calcium: 8 mg/dL — ABNORMAL LOW (ref 8.9–10.3)
Chloride: 109 mmol/L (ref 98–111)
Chloride: 107 mmol/L (ref 98–111)
Creatinine, Ser: 1.36 mg/dL — ABNORMAL HIGH (ref 0.61–1.24)
Creatinine, Ser: 1.6 mg/dL — ABNORMAL HIGH (ref 0.61–1.24)
GFR, Estimated: 56 mL/min — ABNORMAL LOW (ref >60)
GFR, Estimated: 46 mL/min — ABNORMAL LOW (ref >60)
Glucose, Bld: 143 mg/dL — ABNORMAL HIGH (ref 70–99)
Glucose, Bld: 127 mg/dL — ABNORMAL HIGH (ref 70–99)
Potassium: 4.8 mmol/L (ref 3.5–5.1)
Potassium: 5.4 mmol/L — ABNORMAL HIGH (ref 3.5–5.1)
Sodium: 137 mmol/L (ref 135–145)
Sodium: 138 mmol/L (ref 135–145)

## 2020-08-24 LAB — POCT I-STAT 7, (LYTES, BLD GAS, ICA,H+H)
Acid-base deficit: 3 mmol/L — ABNORMAL HIGH (ref 0.0–2.0)
Acid-base deficit: 4 mmol/L — ABNORMAL HIGH (ref 0.0–2.0)
Acid-base deficit: 5 mmol/L — ABNORMAL HIGH (ref 0.0–2.0)
Bicarbonate: 20.3 mmol/L (ref 20.0–28.0)
Bicarbonate: 20.4 mmol/L (ref 20.0–28.0)
Bicarbonate: 22.3 mmol/L (ref 20.0–28.0)
Calcium, Ion: 1.08 mmol/L — ABNORMAL LOW (ref 1.15–1.40)
Calcium, Ion: 1.16 mmol/L (ref 1.15–1.40)
Calcium, Ion: 1.26 mmol/L (ref 1.15–1.40)
HCT: 23 % — ABNORMAL LOW (ref 39.0–52.0)
HCT: 25 % — ABNORMAL LOW (ref 39.0–52.0)
HCT: 30 % — ABNORMAL LOW (ref 39.0–52.0)
Hemoglobin: 10.2 g/dL — ABNORMAL LOW (ref 13.0–17.0)
Hemoglobin: 7.8 g/dL — ABNORMAL LOW (ref 13.0–17.0)
Hemoglobin: 8.5 g/dL — ABNORMAL LOW (ref 13.0–17.0)
O2 Saturation: 96 %
O2 Saturation: 96 %
O2 Saturation: 97 %
Patient temperature: 36.3
Patient temperature: 37.6
Patient temperature: 37.8
Potassium: 3.8 mmol/L (ref 3.5–5.1)
Potassium: 3.9 mmol/L (ref 3.5–5.1)
Potassium: 4.6 mmol/L (ref 3.5–5.1)
Sodium: 143 mmol/L (ref 135–145)
Sodium: 143 mmol/L (ref 135–145)
Sodium: 145 mmol/L (ref 135–145)
TCO2: 21 mmol/L — ABNORMAL LOW (ref 22–32)
TCO2: 21 mmol/L — ABNORMAL LOW (ref 22–32)
TCO2: 24 mmol/L (ref 22–32)
pCO2 arterial: 35.8 mmHg (ref 32.0–48.0)
pCO2 arterial: 38.8 mmHg (ref 32.0–48.0)
pCO2 arterial: 41.1 mmHg (ref 32.0–48.0)
pH, Arterial: 7.33 — ABNORMAL LOW (ref 7.350–7.450)
pH, Arterial: 7.339 — ABNORMAL LOW (ref 7.350–7.450)
pH, Arterial: 7.366 (ref 7.350–7.450)
pO2, Arterial: 83 mmHg (ref 83.0–108.0)
pO2, Arterial: 90 mmHg (ref 83.0–108.0)
pO2, Arterial: 94 mmHg (ref 83.0–108.0)

## 2020-08-24 LAB — CBC
HCT: 27.6 % — ABNORMAL LOW (ref 39.0–52.0)
HCT: 29.8 % — ABNORMAL LOW (ref 39.0–52.0)
Hemoglobin: 9.4 g/dL — ABNORMAL LOW (ref 13.0–17.0)
Hemoglobin: 9.7 g/dL — ABNORMAL LOW (ref 13.0–17.0)
MCH: 31.6 pg (ref 26.0–34.0)
MCH: 31.1 pg (ref 26.0–34.0)
MCHC: 34.1 g/dL (ref 30.0–36.0)
MCHC: 32.6 g/dL (ref 30.0–36.0)
MCV: 92.9 fL (ref 80.0–100.0)
MCV: 95.5 fL (ref 80.0–100.0)
Platelets: 141 10*3/uL — ABNORMAL LOW (ref 150–400)
Platelets: 121 10*3/uL — ABNORMAL LOW (ref 150–400)
RBC: 2.97 MIL/uL — ABNORMAL LOW (ref 4.22–5.81)
RBC: 3.12 MIL/uL — ABNORMAL LOW (ref 4.22–5.81)
RDW: 13.6 % (ref 11.5–15.5)
RDW: 13.7 % (ref 11.5–15.5)
WBC: 9.3 10*3/uL (ref 4.0–10.5)
WBC: 8.5 10*3/uL (ref 4.0–10.5)
nRBC: 0 % (ref 0.0–0.2)
nRBC: 0 % (ref 0.0–0.2)

## 2020-08-24 LAB — COOXEMETRY PANEL
Carboxyhemoglobin: 1 % (ref 0.5–1.5)
Methemoglobin: 1.1 % (ref 0.0–1.5)
O2 Saturation: 76.1 %
Total hemoglobin: 10 g/dL — ABNORMAL LOW (ref 12.0–16.0)

## 2020-08-24 LAB — GLUCOSE, CAPILLARY
Glucose-Capillary: 106 mg/dL — ABNORMAL HIGH (ref 70–99)
Glucose-Capillary: 107 mg/dL — ABNORMAL HIGH (ref 70–99)
Glucose-Capillary: 117 mg/dL — ABNORMAL HIGH (ref 70–99)
Glucose-Capillary: 119 mg/dL — ABNORMAL HIGH (ref 70–99)
Glucose-Capillary: 124 mg/dL — ABNORMAL HIGH (ref 70–99)
Glucose-Capillary: 127 mg/dL — ABNORMAL HIGH (ref 70–99)
Glucose-Capillary: 132 mg/dL — ABNORMAL HIGH (ref 70–99)
Glucose-Capillary: 138 mg/dL — ABNORMAL HIGH (ref 70–99)
Glucose-Capillary: 162 mg/dL — ABNORMAL HIGH (ref 70–99)
Glucose-Capillary: 82 mg/dL (ref 70–99)
Glucose-Capillary: 86 mg/dL (ref 70–99)
Glucose-Capillary: 93 mg/dL (ref 70–99)
Glucose-Capillary: 93 mg/dL (ref 70–99)
Glucose-Capillary: 99 mg/dL (ref 70–99)

## 2020-08-24 LAB — MAGNESIUM
Magnesium: 2.9 mg/dL — ABNORMAL HIGH (ref 1.7–2.4)
Magnesium: 2.7 mg/dL — ABNORMAL HIGH (ref 1.7–2.4)

## 2020-08-24 MED ORDER — INSULIN DETEMIR 100 UNIT/ML ~~LOC~~ SOLN
10.0000 [IU] | Freq: Every day | SUBCUTANEOUS | Status: DC
  Administered 2020-08-25 – 2020-08-28 (×4): 10 [IU] via SUBCUTANEOUS
  Filled 2020-08-24 (×4): qty 0.1

## 2020-08-24 MED ORDER — MIDODRINE HCL 5 MG PO TABS
5.0000 mg | ORAL_TABLET | Freq: Three times a day (TID) | ORAL | Status: DC
  Administered 2020-08-24 – 2020-08-28 (×12): 5 mg via ORAL
  Filled 2020-08-24 (×10): qty 1

## 2020-08-24 MED ORDER — INSULIN ASPART 100 UNIT/ML IJ SOLN
0.0000 [IU] | INTRAMUSCULAR | Status: DC
  Administered 2020-08-24: 4 [IU] via SUBCUTANEOUS
  Administered 2020-08-25 (×4): 2 [IU] via SUBCUTANEOUS
  Administered 2020-08-26: 4 [IU] via SUBCUTANEOUS

## 2020-08-24 MED ORDER — INSULIN DETEMIR 100 UNIT/ML ~~LOC~~ SOLN
10.0000 [IU] | Freq: Once | SUBCUTANEOUS | Status: AC
  Administered 2020-08-24: 10 [IU] via SUBCUTANEOUS
  Filled 2020-08-24: qty 0.1

## 2020-08-24 MED ORDER — ENOXAPARIN SODIUM 40 MG/0.4ML IJ SOSY
40.0000 mg | PREFILLED_SYRINGE | Freq: Every day | INTRAMUSCULAR | Status: DC
  Administered 2020-08-24 – 2020-08-30 (×7): 40 mg via SUBCUTANEOUS
  Filled 2020-08-24 (×7): qty 0.4

## 2020-08-24 NOTE — Progress Notes (Signed)
      [address]       [place] [address]             [phone]                 1 Day Post-Op Procedure(s) (LRB): CORONARY ARTERY BYPASS GRAFTING (CABG) x 3 ON CARDIOPULMONARY BYPASS USING EVH AND PARTIALLY OPEN HARVESTED LEFT GREATER SAPHENOUS VEIN AND LEFT INTERNAL MAMMARY ARTERY.  LIMA TO LAD, SVG TO OM, SVG TO PDA (N/A) TRANSESOPHAGEAL ECHOCARDIOGRAM (TEE) (N/A) APPLICATION OF CELL SAVER (N/A) ENDOVEIN HARVEST AND PARTIALLY OPEN GREATER SAPHENOUS VEIN HARVESTING (Left)   Events: No events extubated _______________________________________________________________ Vitals: BP (!) 83/55   Pulse 91   Temp 98.8 F (37.1 C) (Axillary)   Resp 14   Ht 5\' 4"  (1.626 m)   Wt 61.7 kg   SpO2 100%   BMI 23.35 kg/m   - Neuro: alert NAD  - Cardiovascular: sinus  Drips: dob 3, levo 3.   CVP:  [0 mmHg-12 mmHg] 8 mmHg  - Pulm: EWOB   ABG    Component Value Date/Time   PHART 7.366 08/24/2020 0030   PCO2ART 35.8 08/24/2020 0030   PO2ART 83 08/24/2020 0030   HCO3 20.4 08/24/2020 0030   TCO2 21 (L) 08/24/2020 0030   ACIDBASEDEF 4.0 (H) 08/24/2020 0030   O2SAT 76.1 08/24/2020 0759    - Abd: ND, soft - Extremity: warm  .Intake/Output      07/19 0701 07/20 0700 07/20 0701 07/21 0700   I.V. (mL/kg) 2445.6 (39.6) 117.6 (1.9)   Blood 260    IV Piggyback 1127.4 100   Total Intake(mL/kg) 3833 (62.1) 217.6 (3.5)   Urine (mL/kg/hr) 2210 (1.5) 75 (0.5)   Blood 469    Chest Tube 300    Total Output 2979 75   Net +854 +142.6           _______________________________________________________________ Labs: CBC Latest Ref Rng & Units 08/24/2020 08/24/2020 08/23/2020  WBC 4.0 - 10.5 K/uL 9.3 - -  Hemoglobin 13.0 - 17.0 g/dL 08/25/2020) 7.8(L) 8.5(L)  Hematocrit 39.0 - 52.0 % 27.6(L) 23.0(L) 25.0(L)  Platelets 150 - 400 K/uL 141(L) - -   CMP Latest Ref Rng & Units 08/24/2020 08/24/2020 08/23/2020  Glucose 70 - 99 mg/dL 08/25/2020) - -  BUN 8 - 23 mg/dL 23 - -   Creatinine 937(J - 1.24 mg/dL 6.96) - -  Sodium 7.89(F - 145 mmol/L 137 145 143  Potassium 3.5 - 5.1 mmol/L 4.8 3.8 3.9  Chloride 98 - 111 mmol/L 109 - -  CO2 22 - 32 mmol/L 23 - -  Calcium 8.9 - 10.3 mg/dL 8.1(L) - -  Total Protein 6.5 - 8.1 g/dL - - -  Total Bilirubin 0.3 - 1.2 mg/dL - - -  Alkaline Phos 38 - 126 U/L - - -  AST 15 - 41 U/L - - -  ALT 0 - 44 U/L - - -    CXR: clear  _______________________________________________________________  Assessment and Plan: POD 1 s/p CABG.  Doing well  Neuro: pain controlled. CV: wean levo as tolerated.  Added midodrine.  Wean dob per HF.  Will keep pacing wires for now Pulm: pulm hygiene Renal: good uop.  Creat slightly up.  Will follow GI: advancing diet Heme: stable ID: [email or web address] Endo: SSI Dispo: continue ICU care   810 08/24/2020 9:26 AM

## 2020-08-24 NOTE — Progress Notes (Addendum)
Advanced Heart Failure Rounding Note  PCP-Cardiologist: None   Subjective:    POD 1 after CABG x3 (LIMA-LAD, SVG-OM, SVG-PDA).  He c/o pain at the midsternal incision and chest tube sites and reports difficulty taking deep breaths and coughing.      He remains on dobutamine at 5 and Levophed at 3 this morning.  CVP 8-9. CI 5.2 by Flo-track.   Creatinine increased slightly to 1.36 from 1.29 yesterday with UOP of 2.2 liters x24 hours.  Weight is up 8 pounds since yesterday.     Objective:   Weight Range: 61.7 kg Body mass index is 23.35 kg/m.   Vital Signs:   Temp:  [96.98 F (36.1 C)-100.22 F (37.9 C)] 99.32 F (37.4 C) (07/20 0400) Pulse Rate:  [65-100] 97 (07/20 0700) Resp:  [12-26] 21 (07/20 0700) BP: (84-126)/(50-71) 103/63 (07/20 0700) SpO2:  [93 %-100 %] 99 % (07/20 0700) Arterial Line BP: (100-172)/(36-59) 150/55 (07/20 0700) FiO2 (%):  [40 %-50 %] 40 % (07/19 2300) Weight:  [61.7 kg] 61.7 kg (07/20 0500) Last BM Date: 08/19/20  Weight change: Filed Weights   08/22/20 0341 08/23/20 0500 08/24/20 0500  Weight: 57.7 kg 58.3 kg 61.7 kg    Intake/Output:   Intake/Output Summary (Last 24 hours) at 08/24/2020 0749 Last data filed at 08/24/2020 0600 Gross per 24 hour  Intake 3832.95 ml  Output 2979 ml  Net 853.95 ml       Physical Exam    General:  Awake and alert. Shallow breathing due to pain HEENT: LIJ TLC. Normocephalic, atraumatic  Neck: Supple. No significant JVP noted. No lymphadenopathy or thyromegaly appreciated. Cor: PMI nondisplaced. Regular rate & rhythm. No rubs, gallops or murmurs. Lungs: Clear to auscultation. Chest tubes in place and draining appropriately  Abdomen: Soft, nontender, nondistended. No hepatosplenomegaly. No bruits or masses. Good bowel sounds. Extremities: No cyanosis, clubbing, rash, edema Neuro: Alert. Cranial nerves grossly intact. moves all 4 extremities w/o difficulty. Affect pleasant.   Language barrier.  Patient  speaks a dialect of [place] that is not available via [employer], but able to understand some [place].    Telemetry   NSR in 90s with no arrhythmias noted over the past 12 hours.  Personally reviewed    EKG      Labs    CBC Recent Labs    08/23/20 1750 08/23/20 1753 08/24/20 0030 08/24/20 0413  WBC 7.5  --   --  9.3  HGB 10.7*   < > 7.8* 9.4*  HCT 31.9*   < > 23.0* 27.6*  MCV 92.7  --   --  92.9  PLT 110*  --   --  141*   < > = values in this interval not displayed.    Basic Metabolic Panel Recent Labs    08/23/20 0500 08/23/20 0838 08/23/20 0927 08/23/20 1237 08/23/20 1622 08/23/20 1625 08/24/20 0030 08/24/20 0413  NA 139   < > 140   < > 141   < > 145 137  K 4.3   < > 4.0   < > 5.1   < > 3.8 4.8  CL 108  --  111   < > 108  --   --  109  CO2 28  --  25  --   --   --   --  23  GLUCOSE 222*  --  117*   < > 108*  --   --  143*  BUN 34*  --  33*   < > 26*  --   --  23  CREATININE 1.29*  --  1.27*   < > 1.10  --   --  1.36*  CALCIUM 8.5*  --  8.7*  --   --   --   --  8.1*  MG 2.1  --   --   --   --   --   --  2.9*   < > = values in this interval not displayed.    Liver Function Tests Recent Labs    08/23/20 0927  AST 17  ALT 17  ALKPHOS 61  BILITOT 0.5  PROT 5.6*  ALBUMIN 2.6*   No results for input(s): LIPASE, AMYLASE in the last 72 hours. Cardiac Enzymes No results for input(s): CKTOTAL, CKMB, CKMBINDEX, TROPONINI in the last 72 hours.  BNP: BNP (last 3 results) Recent Labs    08/20/20 0449 08/22/20 0312  BNP 1,079.7* 726.1*     ProBNP (last 3 results) No results for input(s): PROBNP in the last 8760 hours.   D-Dimer No results for input(s): DDIMER in the last 72 hours. Hemoglobin A1C Recent Labs    08/23/20 0720  HGBA1C 9.4*   Fasting Lipid Panel No results for input(s): CHOL, HDL, LDLCALC, TRIG, CHOLHDL, LDLDIRECT in the last 72 hours.  Thyroid Function Tests No results for input(s): TSH, T4TOTAL, T3FREE, THYROIDAB in  the last 72 hours.  Invalid input(s): FREET3  Other results:   Imaging    DG Chest Port 1 View  Result Date: 08/24/2020 CLINICAL DATA:  Open-heart surgery. EXAM: PORTABLE CHEST 1 VIEW COMPARISON:  08/23/2020. FINDINGS: Interim removal of endotracheal tube and NG tube. Left IJ line and mediastinal drainage catheter in stable position. Prior CABG. Cardiomegaly with mild pulmonary venous congestion noted on today's exam. Atelectatic changes right upper lung. Atelectatic changes and or infiltrate left lung base. Small bilateral pleural effusions. No pneumothorax. IMPRESSION: 1. Interim removal of endotracheal tube and NG tube. Left IJ line mediastinal drainage catheter stable position. No pneumothorax. 2. Prior CABG. Cardiomegaly with mild pulmonary venous congestion noted on today's exam. 3. Atelectatic changes right upper lung. Atelectatic changes and or infiltrate left lung base. Small bilateral pleural effusions. Electronically Signed   By: Marcello Moores  Register   On: 08/24/2020 06:30   DG Chest Port 1 View  Result Date: 08/23/2020 CLINICAL DATA:  Status post coronary bypass graft. EXAM: PORTABLE CHEST 1 VIEW COMPARISON:  August 23, 2020. FINDINGS: Endotracheal and nasogastric tubes appear to be in grossly good position. Left internal jugular catheter tip is noted in left brachiocephalic vein. Sternotomy wires are noted. Probable small bore mediastinal drain is noted. No pneumothorax or pleural effusion is noted. Mild bibasilar subsegmental atelectasis is noted. Bony thorax is unremarkable. IMPRESSION: Endotracheal and nasogastric tubes are in good position. Left internal jugular catheter is noted with tip in expected position of left brachiocephalic vein. Probable small bore mediastinal drain is noted. Mild bibasilar subsegmental atelectasis is noted. Electronically Signed   By: Marijo Conception M.D.   On: 08/23/2020 18:41   DG CHEST PORT 1 VIEW  Result Date: 08/23/2020 CLINICAL DATA:  Check trans  venous pacemaker EXAM: PORTABLE CHEST 1 VIEW COMPARISON:  08/20/2020 FINDINGS: Cardiac shadow is stable. Aortic calcifications are noted. Right jugular transvenous pacemaker is noted overlying cardiac shadow. Vascular congestion is noted without significant edema. No focal infiltrate is noted. No bony abnormality is seen. IMPRESSION: Transvenous pacer in appropriate position. Changes of vascular congestion. Electronically Signed  By: Inez Catalina M.D.   On: 08/23/2020 09:21   ECHO INTRAOPERATIVE TEE  Result Date: 08/23/2020  *INTRAOPERATIVE TRANSESOPHAGEAL REPORT *  Patient Name:   Colin [name] [facility] [place] Date of Exam: 08/23/2020 Medical Rec #:  [id number]    Height:       64.0 in Accession #:    [id number]   Weight:       128.5 lb Date of Birth:  [birth year]    BSA:          1.62 m Patient Age:    70 years     BP:           113/57 mmHg Patient Gender: M            HR:           79 bpm. Exam Location:  Inpatient Transesophogeal exam was perform intraoperatively during surgical procedure. Patient was closely monitored under general anesthesia during the entirety of examination. Indications:     CABG Sonographer:     Clayton Lefort RDCS (AE) Performing Phys: Adele Barthel MD Diagnosing Phys: Adele Barthel MD Complications: No known complications during this procedure. POST-OP IMPRESSIONS Overall, there were no significant changes from pre-bypass. - Left Ventricle: The left ventricle is unchanged from pre-bypass. - Right Ventricle: The right ventricle appears unchanged from pre-bypass. - Aorta: The aorta appears unchanged from pre-bypass. - Left Atrium: The left atrium appears unchanged from pre-bypass. - Left Atrial Appendage: The left atrial appendage appears unchanged from pre-bypass. - Aortic Valve: The aortic valve appears unchanged from pre-bypass. - Mitral Valve: There is mild mitral regurgitation. - Tricuspid Valve: The tricuspid valve appears unchanged from pre-bypass. - Pulmonic Valve: The pulmonic valve appears unchanged  from pre-bypass. - Interatrial Septum: The interatrial septum appears unchanged from pre-bypass. - Interventricular Septum: The interventricular septum appears unchanged from pre-bypass. - Pericardium: The pericardium appears unchanged from pre-bypass. PRE-OP FINDINGS  Left Ventricle: The left ventricle has severely reduced systolic function, with an ejection fraction of 20-30%. The cavity size was moderately dilated. There is mildly increased left ventricular wall thickness. There is mild concentric left ventricular hypertrophy. Right Ventricle: The right ventricle has normal systolic function. The cavity was normal. There is no increase in right ventricular wall thickness. Left Atrium: Left atrial size was normal in size. No left atrial/left atrial appendage thrombus was detected. Right Atrium: Right atrial size was normal in size. Interatrial Septum: No atrial level shunt detected by color flow Doppler. Pericardium: There is no evidence of pericardial effusion. Mitral Valve: The mitral valve is normal in structure. Mitral valve regurgitation is mild by color flow Doppler. Tricuspid Valve: The tricuspid valve was normal in structure. Tricuspid valve regurgitation was not visualized by color flow Doppler. Aortic Valve: The aortic valve is normal in structure. Aortic valve regurgitation was not visualized by color flow Doppler. There is mild calcification present on the aortic valve right coronary, left coronary and non-coronary cusps. Pulmonic Valve: The pulmonic valve was normal in structure. Pulmonic valve regurgitation is not visualized by color flow Doppler. Aorta: The aortic root, ascending aorta and aortic arch are normal in size and structure. +--------------+-------++ LEFT VENTRICLE        +--------------+-------++ PLAX 2D               +--------------+-------++ LVIDd:        5.00 cm +--------------+-------++ LVIDs:        4.50 cm +--------------+-------++ LV SV:        26 ml    +--------------+-------++  LV SV Index:  15.87   +--------------+-------++                       +--------------+-------++ +----------------+-----------++ MR Peak grad:   114.1 mmHg  +----------------+-----------++ MR Mean grad:   61.0 mmHg   +----------------+-----------++ MR Vmax:        534.00 cm/s +----------------+-----------++ MR Vmean:       353.0 cm/s  +----------------+-----------++ MR PISA:        1.01 cm    +----------------+-----------++ MR PISA Eff ROA:7 mm       +----------------+-----------++ MR PISA Radius: 0.40 cm     +----------------+-----------++  Adele Barthel MD Electronically signed by Adele Barthel MD Signature Date/Time: 08/23/2020/5:16:23 PM    Final      Medications:     Scheduled Medications:  acetaminophen  1,000 mg Oral Q6H   Or   acetaminophen (TYLENOL) oral liquid 160 mg/5 mL  1,000 mg Per Tube Q6H   acetaminophen (TYLENOL) oral liquid 160 mg/5 mL  650 mg Per Tube Once   Or   acetaminophen  650 mg Rectal Once   aspirin EC  325 mg Oral Daily   Or   aspirin  324 mg Per Tube Daily   bisacodyl  10 mg Oral Daily   Or   bisacodyl  10 mg Rectal Daily   chlorhexidine gluconate (MEDLINE KIT)  15 mL Mouth Rinse BID   Chlorhexidine Gluconate Cloth  6 each Topical Daily   Chlorhexidine Gluconate Cloth  6 each Topical Q0600   docusate sodium  200 mg Oral Daily   feeding supplement (GLUCERNA SHAKE)  237 mL Oral TID BM   gabapentin  100 mg Oral TID   metoprolol tartrate  12.5 mg Oral BID   Or   metoprolol tartrate  12.5 mg Per Tube BID   multivitamin with minerals  1 tablet Oral Daily   [START ON 08/25/2020] pantoprazole  40 mg Oral Daily   rosuvastatin  40 mg Oral QHS   sodium chloride flush  10-40 mL Intracatheter Q12H   sodium chloride flush  3 mL Intravenous Q12H    Infusions:  sodium chloride 20 mL/hr at 08/24/20 0600   sodium chloride     sodium chloride     albumin human Stopped (08/24/20 0337)    ceFAZolin (ANCEF) IV  2 g (08/24/20 0609)   dexmedetomidine (PRECEDEX) IV infusion Stopped (08/24/20 0439)   DOBUTamine 5 mcg/kg/min (08/24/20 0600)   epinephrine Stopped (08/23/20 2142)   famotidine (PEPCID) IV Stopped (08/23/20 2019)   insulin 1.2 Units/hr (08/24/20 0600)   lactated ringers     lactated ringers 20 mL/hr at 08/24/20 0600   lactated ringers     nitroGLYCERIN     norepinephrine (LEVOPHED) Adult infusion 7 mcg/min (08/24/20 0600)    PRN Medications: sodium chloride, albumin human, dextrose, lactated ringers, metoprolol tartrate, midazolam, morphine injection, ondansetron (ZOFRAN) IV, oxyCODONE, sodium chloride flush, sodium chloride flush, traMADol    Patient Profile   The patient is a 70 y/o Asian male with a medical history significant for hypertension, hyperlipidemia and diabetes who presented to [facility] for evaluation of chest pain.  He ruled in for an NSTEMI and underwent a cardiac catheterization that revealed multivessel CAD.  He will undergo a CABG on 08/23/2020.   Assessment/Plan   1. CAD:  Remote PCI to LCx and RCA.  NSTEMI this admission, found to have 3VD with occluded RCA with collaterals, 80% long proximal LAD stenosis, 90% stenosis  proximal LCx and OM1 (relatively small caliber).  He is a diabetic with low EF.  Echo shows diffuse moderate hypokinesis, there is no thinning or akinesis, so suspect significant viability. - s/p CABG x3 on 08/23/2020  - Beta blocker held due to long pause -- will continue to hold until appropriate post-operative period  - Continue aspirin and statin  2. Acute systolic CHF: Echo with EF 30-35%, normal RV, moderate MR.  Ischemic cardiomyopathy.  He is not volume overloaded by exam and RHC on 08/22/2020 showed near-normal filling pressures with preserved cardiac output. CI 5.2 by Flo-Track (good) and CVP 8-9 this morning.  - Decrease dobutamine to 3, will follow co-ox.  - Creatinine fairly stable (has been ~1.3 from 1.49) -- can consider starting diuresis on  POD 2 if appropriate  - Hold off on beta blocker for now with long sinus pause. - He can continue dapagliflozin - Do not think he will need MV repair, suspect functional and will improve with improvement in LV function 3. Sinus pause: 5 second pause on 7/18 -- TTVP placed, but now with pacing wires postoperatively.  Rhythm appears stable currently with no further pauses noted.  - ECG this morning.  4. Type 2 diabetes: Insulin. 5. 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  Also with ulcerated plaque in infrarenal aorta. - Pre-CABG ultrasounds as noted above  6. Smoking: Urged him to quit   Length of Stay: [place], NP  08/24/2020, 7:49 AM  Patient seen with NP, agree with the above note.   Stable post-op.  ECG with NSR. CI 5.2 by Flo-Trak, CVP 8-9.  He is on dobutamine 5 and NE 3.  Extubated yesterday with no issues.   General: NAD Neck: No JVD, no thyromegaly or thyroid nodule.  Lungs: Clear to auscultation bilaterally with normal respiratory effort. CV: Nondisplaced PMI.  Heart regular S1/S2, no S3/S4, no murmur.  No peripheral edema.  No carotid bruit.   Abdomen: Soft, nontender, no hepatosplenomegaly, no distention.  Skin: Intact without lesions or rashes.  Neurologic: Alert and oriented x 3.  Psych: Normal affect. Extremities: No clubbing or cyanosis.  HEENT: Normal.   ECG with NSR, no further bradyarrhythmias noted and no pacing.   Think we can decrease dobutamine to 3, follow co-ox.  Titrate NE as tolerated.   Weight up 8 lbs, likely start diuresis tomorrow am.   ASA, statin.   Mobilize.   CRITICAL CARE Performed by: Loralie Champagne  Total critical care time: 35 minutes  Critical care time was exclusive of separately billable procedures and treating other patients.  Critical care was necessary to treat or prevent imminent or life-threatening deterioration.  Critical care was time spent personally by me on the  following activities: development of treatment plan with patient and/or surrogate as well as nursing, discussions with consultants, evaluation of patient's response to treatment, examination of patient, obtaining history from patient or surrogate, ordering and performing treatments and interventions, ordering and review of laboratory studies, ordering and review of radiographic studies, pulse oximetry and re-evaluation of patient's condition.  Loralie Champagne 08/24/2020 8:04 AM

## 2020-08-24 NOTE — Progress Notes (Addendum)
Patient ID: Colin [name], male   DOB: 01/04/1951, 70 y.o.   MRN: [id number] TCTS Evening Rounds:  SBP 90's on dobut 3. Midodrine started today. Sinus rhythm 80's  Chest tube output low.  CBC    Component Value Date/Time   WBC 8.5 08/24/2020 1619   RBC 3.12 (L) 08/24/2020 1619   HGB 9.7 (L) 08/24/2020 1619   HGB 12.5 (L) 12/17/2019 1101   HCT 29.8 (L) 08/24/2020 1619   HCT 38.6 12/17/2019 1101   PLT 121 (L) 08/24/2020 1619   PLT 276 12/17/2019 1101   MCV 95.5 08/24/2020 1619   MCV 94 12/17/2019 1101   MCH 31.1 08/24/2020 1619   MCHC 32.6 08/24/2020 1619   RDW 13.7 08/24/2020 1619   RDW 13.4 12/17/2019 1101   LYMPHSABS 1.2 05/06/2020 1351   LYMPHSABS 1.4 12/17/2019 1101   MONOABS 0.4 05/06/2020 1351   EOSABS 0.1 05/06/2020 1351   EOSABS 0.4 12/17/2019 1101   BASOSABS 0.1 05/06/2020 1351   BASOSABS 0.1 12/17/2019 1101   BMET    Component Value Date/Time   NA 138 08/24/2020 1619   NA 142 12/17/2019 1101   K 5.4 (H) 08/24/2020 1619   CL 107 08/24/2020 1619   CO2 25 08/24/2020 1619   GLUCOSE 127 (H) 08/24/2020 1619   BUN 27 (H) 08/24/2020 1619   BUN 23 12/17/2019 1101   CREATININE 1.60 (H) 08/24/2020 1619   CALCIUM 8.0 (L) 08/24/2020 1619   GFRNONAA 46 (L) 08/24/2020 1619   GFRAA 75 12/17/2019 1101   Making some urine. Creat up today. K+ 5.4. will repeat in am.

## 2020-08-24 NOTE — Discharge Summary (Signed)
Physician Discharge Summary       [address]       [place], [address]             [phone]    Patient ID: Colin [name] MRN: [id number] DOB/AGE: [birth year] 70 y.o.  Admit date: 08/20/2020 Discharge date: 08/31/2020  Admission Diagnoses: NSTEMI (non-ST elevated myocardial infarction) ([place]) 2. Coronary artery disease  Discharge Diagnoses:  S/P CABG x 3 2. New onset of congestive heart failure ([place]) 3. Sinus pause during cardiac catheterization now with temporary pacer wire in place 4. HFrEF (heart failure with reduced ejection fraction) ([place]) 5. 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 7. Severe atherosclerosis of the thoracoabdominal aorta, including multiple ulcerated plaques in the infrarenal abdominal aorta is aberrant right subclavian artery. 8. Mild stenosis of the infrarenal abdominal aorta and moderate stenosis at the ostium of the inferior mesenteric artery. 9. History of the following: Dyslipidemia Essential hypertension DM type 2 with diabetic mixed hyperlipidemia (HCC) Smoking   Consults:  Advanced heart failure  Procedure (s):  CABG X 3.  LIMA LAD, RSVG PDA, OM1 , Endoscopic greater saphenous vein harvest on the left by Dr. Kipp Brood on 08/23/2020  History of Presenting Illness: We are asked to see this 70 year old male in cardiac surgical consultation for consideration of coronary artery surgical revascularization.  The patient presented to the emergency department on 08/20/2020 with initial complaints of chest pain and shortness of breath.  The patient is from [place] and speaks limited [place].  He is from [place].  He has multiple cardiac risk factors including hypertension, tobacco abse, hyperlipidemia and diabetes mellitus.  The pain began approximately 1 day prior to presentation and was associated with weakness and shortness of breath.   It is noted to be somewhat worse with exertion.  In the emergency department he was noted to have a elevated troponin.  Initial high sensitive troponin was 208 with last repeated value on 08/20/2020 970.  Thus he ruled in for non-STEMI.  He was also noted to have a significantly elevated beta natruretic peptide which peaked at 1079.  Reportedly the patient had a recent endocrinology appointment at which time he was told to stop all diabetes medications except Lantus however he stopped all his medications including blood pressure medications. Has findings were consistent with ACS with new CHF felt likely to be ischemic . Cardiology consultation was obtained and he was admitted for further evaluation and treatment.  Initial EKG did show sinus rhythm with nonspecific ST changes felt to be concerning for lateral ischemia.  He has began on heparin and nitroglycerin drips in the emergency department and was additionally ordered beta-blocker by cardiology.  His initial renal function was in the normal range but he did evidence acute kidney insufficiency and most recent creatinine on today's date is 1.49 with a BUN of 32.  His most recent hemoglobin A1c dated 08/04/2020 shows poor control of his diabetes with a value of 10.7.  He had evaluated in April of this year also of 13.0.  Echocardiogram performed this hospitalization reveals reduced left ventricular ejection fraction by estimation at 30 to 35%.  Please see the full report listed below.  Cardiac catheterization performed today shows evidence of severe three-vessel coronary artery disease including in-stent restenosis in the circumflex and right coronary artery distributions.  Please see the full report below.   Note-was unable to obtain an interpreter at time of  evaluation but did speak with son who speaks [place].  He will assist in discussing procedure with the patient but he and other family members feel as though we should proceed with surgery if that is our  recommendation.  Dr. Kipp Brood discussed the need for coronary artery bypass grafting surgery. Potential risks, benefits, and complications of the surgery were discussed with the patient and he agreed to proceed with surgery. Pre operative carotid duplex US showed no significant right internal carotid artery stenosis and a 40-59% left internal carotid artery stenosis. Patient underwent a CABG x 3  by Dr. Kipp Brood on 08/23/2020.   Hospital Course:  Patient was transferred from the OR to [facility] ICU in stable condition. Patient was extubated later the evening of surgery without difficulty. Patient was weaned off Dobutamine and Levophed as able. He had mildly elevated creatinine the morning of post operative day one (1.36). He was volume overloaded and diuresed accordingly with careful monitoring of creatinine. He was weaned off the Insulin drip. He has a history of diabetes mellitus. His pre op HGA1C is 9.4. He will need close medical follow up after discharge. We continued to work on fluid overload and started him on a diuretic. He has been walking in the halls but needs a lot of encouragement to use his incentive spirometer. We started a diuretic regimen for fluid overload. We appreciated HF following along with [place] and assisting with care. He remains on Dobutamine. We transferred the patient to 4E on 7/24. He was ambulating in the halls with assistance. Dr. Virgina Jock took over the care of the patient when he was transferred to the floor. He cautiously started bisoprolol for his sinus tachycardia. On 7/26 home health was being set up. He was making some forward progress with his ambulation. His EPW were removed on 08/31/2020 without issue. Today, he is tolerating room air, his incisions are healing well, he is ambulating with limited assistance, and he is ready for discharge home with family. We changed him from full dose ASA to [facility] on discharge due to NSTEMI presentation.    Latest Vital Signs: Blood  pressure (!) 134/59, pulse 72, temperature 98 F (36.7 C), temperature source Oral, resp. rate 20, height _0  (1.626 m), weight 54.3 kg, SpO2 92 %.  Physical Exam:  General appearance: alert, cooperative, and no distress Heart: regular rate and rhythm, S1, S2 normal, no murmur, click, rub or gallop Lungs: clear to auscultation bilaterally Abdomen: soft, non-tender; bowel sounds normal; no masses,  no organomegaly Extremities: extremities normal, atraumatic, no cyanosis or edema Wound: clean and dry  Discharge Condition:Stable and discharged to home.  Recent laboratory studies:  Lab Results  Component Value Date   WBC 6.4 08/29/2020   HGB 11.5 (L) 08/29/2020   HCT 35.3 (L) 08/29/2020   MCV 92.9 08/29/2020   PLT 353 08/29/2020   Lab Results  Component Value Date   NA 137 08/29/2020   K 4.4 08/29/2020   CL 98 08/29/2020   CO2 30 08/29/2020   CREATININE 1.34 (H) 08/29/2020   GLUCOSE 199 (H) 08/29/2020      Diagnostic Studies: DG Chest 2 View  Result Date: 08/20/2020 CLINICAL DATA:  70 year old male with chest pain on the left. Shortness of breath since yesterday. Denies cough or fever. EXAM: CHEST - 2 VIEW COMPARISON:  Chest radiographs 05/06/2020 and earlier. FINDINGS: Lung volumes and mediastinal contours are within normal limits. On the lateral view there is trace pleural fluid in the fissures, and there is acute  diffuse increased interstitial opacity in both lungs. Kerley B-lines identified in both lung periphery. Normal cardiac size and mediastinal contours. Visualized tracheal air column is within normal limits. No pneumothorax. No acute osseous abnormality identified. Paucity of bowel gas in the upper abdomen. IMPRESSION: Acute pulmonary interstitial edema with trace pleural fluid. Electronically Signed   By: Genevie Ann M.D.   On: 08/20/2020 05:28   CARDIAC CATHETERIZATION  Addendum Date: 08/22/2020   LM: Minimal disease LAD: Prox-mid long 80% stenosis Lcx: Lcx/OM1 95% ISR  RCA: Prox 80% ISR, followed by In-stent CTO mid-distal vessel Left-to-right collaterals to distal vessel Mildly decompensated ischemic cardiomyopathy Temp pacer placed through [facility] Surgical consult for CABG  Result Date: 08/22/2020 LM: Minimal disease LAD: Prox-mid long 80% stenosis Lcx: Lcx/OM1 95% ISR RCA: Prox 80% ISR, followed by In-stent CTO mid-distal vessel Left-to-right collaterals to distal vessel Mildly decompensated ischemic cardiomyopathy Temp pacer placed through [facility] Surgical consult for CABG   DG Chest Port 1 View  Result Date: 08/26/2020 CLINICAL DATA:  Post CABG, sore chest EXAM: PORTABLE CHEST 1 VIEW COMPARISON:  Portable exam at 0524 hrs compared to 08/25/2020 FINDINGS: LEFT jugular central venous catheter with tip projecting over SVC. Enlargement of cardiac silhouette post CABG. Atherosclerotic calcification aorta. Atelectasis versus consolidation in LEFT lower lobe. Mild atelectasis at RIGHT base. Small LEFT pleural effusion. No pneumothorax. IMPRESSION: Persistent atelectasis versus consolidation of LEFT lower lobe with small LEFT pleural effusion. Improved atelectasis in RIGHT lung. Enlargement of cardiac silhouette post CABG. Aortic Atherosclerosis (ICD10-I70.0). Electronically Signed   By: Lavonia Dana M.D.   On: 08/26/2020 09:15   DG Chest Port 1 View  Result Date: 08/25/2020 CLINICAL DATA:  Status post open heart surgery. EXAM: PORTABLE CHEST 1 VIEW COMPARISON:  08/24/2020. FINDINGS: Left IJ line and mediastinal drainage catheter stable position. Prior CABG. Cardiomegaly again noted. Interim increase in bilateral interstitial prominence suggesting interstitial edema. Persistent atelectasis/infiltrate right upper lung and left base. Small bilateral pleural effusions. No pneumothorax. IMPRESSION: 1. Left IJ line and mediastinal drainage catheter stable position. Prior CABG. Cardiomegaly again noted. 2. Interim increase in bilateral interstitial prominence and small bilateral pleural  effusions. Findings consistent with CHF. 3. Persistent atelectasis/infiltrate right upper lobe and left lung base. Electronically Signed   By: Marcello Moores  Register   On: 08/25/2020 07:24   DG Chest Port 1 View  Result Date: 08/24/2020 CLINICAL DATA:  Open-heart surgery. EXAM: PORTABLE CHEST 1 VIEW COMPARISON:  08/23/2020. FINDINGS: Interim removal of endotracheal tube and NG tube. Left IJ line and mediastinal drainage catheter in stable position. Prior CABG. Cardiomegaly with mild pulmonary venous congestion noted on today's exam. Atelectatic changes right upper lung. Atelectatic changes and or infiltrate left lung base. Small bilateral pleural effusions. No pneumothorax. IMPRESSION: 1. Interim removal of endotracheal tube and NG tube. Left IJ line mediastinal drainage catheter stable position. No pneumothorax. 2. Prior CABG. Cardiomegaly with mild pulmonary venous congestion noted on today's exam. 3. Atelectatic changes right upper lung. Atelectatic changes and or infiltrate left lung base. Small bilateral pleural effusions. Electronically Signed   By: Marcello Moores  Register   On: 08/24/2020 06:30   DG Chest Port 1 View  Result Date: 08/23/2020 CLINICAL DATA:  Status post coronary bypass graft. EXAM: PORTABLE CHEST 1 VIEW COMPARISON:  August 23, 2020. FINDINGS: Endotracheal and nasogastric tubes appear to be in grossly good position. Left internal jugular catheter tip is noted in left brachiocephalic vein. Sternotomy wires are noted. Probable small bore mediastinal drain is noted. No pneumothorax or pleural  effusion is noted. Mild bibasilar subsegmental atelectasis is noted. Bony thorax is unremarkable. IMPRESSION: Endotracheal and nasogastric tubes are in good position. Left internal jugular catheter is noted with tip in expected position of left brachiocephalic vein. Probable small bore mediastinal drain is noted. Mild bibasilar subsegmental atelectasis is noted. Electronically Signed   By: Marijo Conception M.D.   On:  08/23/2020 18:41   DG CHEST PORT 1 VIEW  Result Date: 08/23/2020 CLINICAL DATA:  Check trans venous pacemaker EXAM: PORTABLE CHEST 1 VIEW COMPARISON:  08/20/2020 FINDINGS: Cardiac shadow is stable. Aortic calcifications are noted. Right jugular transvenous pacemaker is noted overlying cardiac shadow. Vascular congestion is noted without significant edema. No focal infiltrate is noted. No bony abnormality is seen. IMPRESSION: Transvenous pacer in appropriate position. Changes of vascular congestion. Electronically Signed   By: Inez Catalina M.D.   On: 08/23/2020 09:21   ECHOCARDIOGRAM COMPLETE  Result Date: 08/20/2020    ECHOCARDIOGRAM REPORT   Patient Name:   Colin [name] [facility] Date of Exam: 08/20/2020 Medical Rec #:  [id number]    Height:       64.0 in Accession #:    [id number]   Weight:       143.3 lb Date of Birth:  Jun 23, 1950    BSA:          1.698 m Patient Age:    37 years     BP:           104/67 mmHg Patient Gender: M            HR:           82 bpm. Exam Location:  Inpatient Procedure: 2D Echo Indications:     chest pain  History:         Patient has no prior history of Echocardiogram examinations.                  CAD; Risk Factors:Diabetes, Hypertension and Dyslipidemia.  Sonographer:     Johny Chess RDCS Referring Phys:  [id number] NKNLZ TOLIA Diagnosing Phys: Rex Kras DO IMPRESSIONS  1. Left ventricular ejection fraction, by estimation, is 30 to 35%. The left ventricle has moderate to severely decreased function. The left ventricle demonstrates global hypokinesis. The left ventricular internal cavity size was mildly dilated. Left ventricular diastolic parameters are consistent with Grade II diastolic dysfunction (pseudonormalization). Elevated left atrial pressure.  2. Right ventricular systolic function is mildly reduced. The right ventricular size is normal.  3. Left atrial size was mildly dilated.  4. The mitral valve is degenerative. Moderate mitral valve regurgitation. No evidence of mitral  stenosis.  5. The aortic valve is tricuspid. Aortic valve regurgitation is not visualized. Mild aortic valve sclerosis is present, with no evidence of aortic valve stenosis.  6. The inferior vena cava is normal in size with greater than 50% respiratory variability, suggesting right atrial pressure of 3 mmHg.  7. Evidence of atrial level shunting detected by color flow Doppler. FINDINGS  Left Ventricle: Left ventricular ejection fraction, by estimation, is 30 to 35%. The left ventricle has moderate to severely decreased function. The left ventricle demonstrates global hypokinesis. The left ventricular internal cavity size was mildly dilated. There is no left ventricular hypertrophy. Left ventricular diastolic parameters are consistent with Grade II diastolic dysfunction (pseudonormalization). Elevated left atrial pressure.  LV Wall Scoring: The anterior septum, inferior wall, mid inferoseptal segment, and basal inferoseptal segment are hypokinetic. Right Ventricle: The right ventricular size is normal. No increase in  right ventricular wall thickness. Right ventricular systolic function is mildly reduced. Left Atrium: Left atrial size was mildly dilated. Right Atrium: Right atrial size was normal in size. Pericardium: There is no evidence of pericardial effusion. Mitral Valve: The mitral valve is degenerative in appearance. There is mild thickening of the mitral valve leaflet(s). Normal mobility of the mitral valve leaflets. Moderate mitral valve regurgitation. No evidence of mitral valve stenosis. Tricuspid Valve: The tricuspid valve is normal in structure. Tricuspid valve regurgitation is trivial. No evidence of tricuspid stenosis. Aortic Valve: The aortic valve is tricuspid. Aortic valve regurgitation is not visualized. Mild aortic valve sclerosis is present, with no evidence of aortic valve stenosis. Pulmonic Valve: The pulmonic valve was grossly normal. Pulmonic valve regurgitation is not visualized. No evidence  of pulmonic stenosis. Aorta: The aortic root and ascending aorta are structurally normal, with no evidence of dilitation. Venous: The inferior vena cava is normal in size with greater than 50% respiratory variability, suggesting right atrial pressure of 3 mmHg. IAS/Shunts: Evidence of atrial level shunting detected by color flow Doppler.  LEFT VENTRICLE PLAX 2D LVIDd:         5.20 cm      Diastology LVIDs:         4.60 cm      LV e' medial:    4.35 cm/s LV PW:         1.10 cm      LV E/e' medial:  22.5 LV IVS:        1.00 cm      LV e' lateral:   9.03 cm/s LVOT diam:     1.90 cm      LV E/e' lateral: 10.8 LV SV:         31 LV SV Index:   18 LVOT Area:     2.84 cm  LV Volumes (MOD) LV vol d, MOD A2C: 120.0 ml LV vol d, MOD A4C: 107.0 ml LV vol s, MOD A2C: 79.8 ml LV vol s, MOD A4C: 76.9 ml LV SV MOD A2C:     40.2 ml LV SV MOD A4C:     107.0 ml LV SV MOD BP:      36.1 ml RIGHT VENTRICLE             IVC RV S prime:     10.00 cm/s  IVC diam: 1.70 cm TAPSE (M-mode): 1.4 cm LEFT ATRIUM             Index       RIGHT ATRIUM           Index LA diam:        3.90 cm 2.30 cm/m  RA Area:     11.30 cm LA Vol (A2C):   66.6 ml 39.22 ml/m RA Volume:   24.10 ml  14.19 ml/m LA Vol (A4C):   50.8 ml 29.92 ml/m LA Biplane Vol: 58.4 ml 34.40 ml/m  AORTIC VALVE LVOT Vmax:   62.60 cm/s LVOT Vmean:  39.400 cm/s LVOT VTI:    0.109 m  AORTA Ao Root diam: 2.60 cm Ao Asc diam:  3.10 cm MITRAL VALVE MV Area (PHT): 5.97 cm      SHUNTS MV Decel Time: 127 msec      Systemic VTI:  0.11 m MR Peak grad:    106.1 mmHg  Systemic Diam: 1.90 cm MR Mean grad:    63.0 mmHg MR Vmax:         515.00 cm/s MR Vmean:  373.0 cm/s MR PISA:         1.27 cm MR PISA Eff ROA: 10 mm MR PISA Radius:  0.45 cm MV E velocity: 97.70 cm/s MV A velocity: 75.40 cm/s MV E/A ratio:  1.30 Sunit Tolia DO Electronically signed by Rex Kras DO Signature Date/Time: 08/20/2020/12:16:39 PM    Final    ECHO INTRAOPERATIVE TEE  Result Date: 08/23/2020  *INTRAOPERATIVE  TRANSESOPHAGEAL REPORT *  Patient Name:   Colin [name] [facility] Date of Exam: 08/23/2020 Medical Rec #:  [id number]    Height:       64.0 in Accession #:    [id number]   Weight:       128.5 lb Date of Birth:  1950-10-16    BSA:          1.62 m Patient Age:    62 years     BP:           113/57 mmHg Patient Gender: M            HR:           79 bpm. Exam Location:  Inpatient Transesophogeal exam was perform intraoperatively during surgical procedure. Patient was closely monitored under general anesthesia during the entirety of examination. Indications:     CABG Sonographer:     Clayton Lefort RDCS (AE) Performing Phys: Adele Barthel MD Diagnosing Phys: Adele Barthel MD Complications: No known complications during this procedure. POST-OP IMPRESSIONS Overall, there were no significant changes from pre-bypass. - Left Ventricle: The left ventricle is unchanged from pre-bypass. - Right Ventricle: The right ventricle appears unchanged from pre-bypass. - Aorta: The aorta appears unchanged from pre-bypass. - Left Atrium: The left atrium appears unchanged from pre-bypass. - Left Atrial Appendage: The left atrial appendage appears unchanged from pre-bypass. - Aortic Valve: The aortic valve appears unchanged from pre-bypass. - Mitral Valve: There is mild mitral regurgitation. - Tricuspid Valve: The tricuspid valve appears unchanged from pre-bypass. - Pulmonic Valve: The pulmonic valve appears unchanged from pre-bypass. - Interatrial Septum: The interatrial septum appears unchanged from pre-bypass. - Interventricular Septum: The interventricular septum appears unchanged from pre-bypass. - Pericardium: The pericardium appears unchanged from pre-bypass. PRE-OP FINDINGS  Left Ventricle: The left ventricle has severely reduced systolic function, with an ejection fraction of 20-30%. The cavity size was moderately dilated. There is mildly increased left ventricular wall thickness. There is mild concentric left ventricular hypertrophy. Right  Ventricle: The right ventricle has normal systolic function. The cavity was normal. There is no increase in right ventricular wall thickness. Left Atrium: Left atrial size was normal in size. No left atrial/left atrial appendage thrombus was detected. Right Atrium: Right atrial size was normal in size. Interatrial Septum: No atrial level shunt detected by color flow Doppler. Pericardium: There is no evidence of pericardial effusion. Mitral Valve: The mitral valve is normal in structure. Mitral valve regurgitation is mild by color flow Doppler. Tricuspid Valve: The tricuspid valve was normal in structure. Tricuspid valve regurgitation was not visualized by color flow Doppler. Aortic Valve: The aortic valve is normal in structure. Aortic valve regurgitation was not visualized by color flow Doppler. There is mild calcification present on the aortic valve right coronary, left coronary and non-coronary cusps. Pulmonic Valve: The pulmonic valve was normal in structure. Pulmonic valve regurgitation is not visualized by color flow Doppler. Aorta: The aortic root, ascending aorta and aortic arch are normal in size and structure. +--------------+-------++ LEFT VENTRICLE        +--------------+-------++ PLAX 2D               +--------------+-------++  LVIDd:        5.00 cm +--------------+-------++ LVIDs:        4.50 cm +--------------+-------++ LV SV:        26 ml   +--------------+-------++ LV SV Index:  15.87   +--------------+-------++                       +--------------+-------++ +----------------+-----------++ MR Peak grad:   114.1 mmHg  +----------------+-----------++ MR Mean grad:   61.0 mmHg   +----------------+-----------++ MR Vmax:        534.00 cm/s +----------------+-----------++ MR Vmean:       353.0 cm/s  +----------------+-----------++ MR PISA:        1.01 cm    +----------------+-----------++ MR PISA Eff ROA:7 mm       +----------------+-----------++ MR  PISA Radius: 0.40 cm     +----------------+-----------++  Adele Barthel MD Electronically signed by Adele Barthel MD Signature Date/Time: 08/23/2020/5:16:23 PM    Final    CT Angio Chest/Abd/Pel for Dissection W and/or W/WO  Result Date: 08/20/2020 CLINICAL DATA:  70 year old male with history of chest pain and back pain with shortness of breath since yesterday. EXAM: CT ANGIOGRAPHY CHEST, ABDOMEN AND PELVIS TECHNIQUE: Non-contrast CT of the chest was initially obtained. Multidetector CT imaging through the chest, abdomen and pelvis was performed using the standard protocol during bolus administration of intravenous contrast. Multiplanar reconstructed images and MIPs were obtained and reviewed to evaluate the vascular anatomy. CONTRAST:  117m OMNIPAQUE IOHEXOL 350 MG/ML SOLN COMPARISON:  No prior chest CT. CT the abdomen and pelvis 12/08/2010. FINDINGS: CTA CHEST FINDINGS Cardiovascular: Precontrast images demonstrate no crescentic high attenuation associated with the wall of the thoracic aorta to suggest acute intramural hemorrhage. There is extensive aortic atherosclerosis, without definite evidence of aneurysm or dissection of the thoracic aorta on today's non cardiac gated examination. Ascending aorta, mid aortic arch and descending aorta measure 3.2 cm, 2.5 cm and 2.6 cm in diameter respectively. Atherosclerotic calcifications are also noted in the left main, left anterior descending, left circumflex and right coronary arteries. Calcifications of the aortic valve. 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 Heart size is enlarged with left ventricular dilatation. There is no significant pericardial fluid, thickening or pericardial calcification. Mediastinum/Nodes: No pathologically enlarged mediastinal or hilar lymph nodes. Esophagus is unremarkable in appearance. No axillary lymphadenopathy. Lungs/Pleura: Moderate right and small left pleural effusions lying  dependently. Patchy areas of mild ground-glass attenuation and diffuse interlobular septal thickening in the lungs, most compatible with a background of interstitial pulmonary edema. No confluent consolidative airspace disease. No pleural effusions. No definite suspicious appearing pulmonary nodules or masses are noted. Musculoskeletal: There are no aggressive appearing lytic or blastic lesions noted in the visualized portions of the skeleton. Review of the MIP images confirms the above findings. CTA ABDOMEN AND PELVIS FINDINGS VASCULAR Aorta: Extensive atherosclerosis of the abdominal aorta with abundant atheromatous plaque, including several ulcerative plaques. No frank aortic aneurysm or dissection noted. No definite penetrating ulcer. Mild narrowing of the infrarenal abdominal aorta (minimal measurements of 10 x 6 mm shortly above the bifurcation). Celiac: Patent without evidence of aneurysm, dissection, vasculitis or significant stenosis. SMA: Patent without evidence of aneurysm, dissection, vasculitis or significant stenosis. Renals: Both renal arteries are patent without evidence of aneurysm, dissection, vasculitis, fibromuscular dysplasia or significant stenosis. IMA: Patent without evidence of aneurysm, dissection or vasculitis. Moderate stenosis at the ostium. Inflow: Extensive atherosclerosis. Patent without evidence of aneurysm, dissection, vasculitis  or significant stenosis. Veins: No obvious venous abnormality within the limitations of this arterial phase study. Review of the MIP images confirms the above findings. NON-VASCULAR Hepatobiliary: No suspicious cystic or solid hepatic lesions. No intra or extrahepatic biliary ductal dilatation. Gallbladder is normal in appearance. Pancreas: No pancreatic mass. No pancreatic ductal dilatation. No pancreatic or peripancreatic fluid collections or inflammatory changes. Spleen: Unremarkable. Adrenals/Urinary Tract: Low-attenuation lesions in the kidneys  bilaterally, compatible with simple cysts, largest of which is in the interpolar region of the left kidney measuring 4.4 cm in diameter. No aggressive appearing renal lesions. Bilateral adrenal glands are normal in appearance. No hydroureteronephrosis. Urinary bladder is normal in appearance. Stomach/Bowel: Normal appearance of the stomach. No pathologic dilatation of small bowel or colon. The appendix is not confidently identified and may be surgically absent. Regardless, there are no inflammatory changes noted adjacent to the cecum to suggest the presence of an acute appendicitis at this time. Lymphatic: No lymphadenopathy noted in the abdomen or pelvis. Reproductive: Prostate gland and seminal vesicles are unremarkable in appearance. Other: No significant volume of ascites.  No pneumoperitoneum. Musculoskeletal: There are no aggressive appearing lytic or blastic lesions noted in the visualized portions of the skeleton. Review of the MIP images confirms the above findings. IMPRESSION: 1. Severe atherosclerosis of the thoracoabdominal aorta, including multiple ulcerated plaques in the infrarenal abdominal aorta. However, there is no evidence of thoracic aortic aneurysm or dissection. 2. 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 3. Mild stenosis of the infrarenal abdominal aorta and moderate stenosis at the ostium of the inferior mesenteric artery. 4. Cardiomegaly with left ventricular dilatation. There is also evidence of interstitial pulmonary edema, along with moderate right and small left pleural effusions; imaging findings concerning for congestive heart failure. 5. Left main and 3 vessel coronary artery disease. Please note that although the presence of coronary artery calcium documents the presence of coronary artery disease, the severity of this disease and any potential stenosis cannot be assessed on this non-gated CT examination. Assessment for  potential risk factor modification, dietary therapy or pharmacologic therapy may be warranted, if clinically indicated. 6. There are calcifications of the aortic valve. Echocardiographic correlation for evaluation of potential valvular dysfunction may be warranted if clinically indicated. 7. Additional incidental findings, as above. Electronically Signed   By: Vinnie Langton M.D.   On: 08/20/2020 07:27   VAS US DOPPLER PRE CABG  Result Date: 08/22/2020 PREOPERATIVE VASCULAR EVALUATION Patient Name:  Colin [name] [facility]  Date of Exam:   08/22/2020 Medical Rec #: [id number]     Accession #:    [id number] Date of Birth: September 16, 1950     Patient Gender: M Patient Age:   70Y Exam Location:  [facility] Procedure:      VAS US DOPPLER PRE CABG Referring Phys: [id number] HARRELL O LIGHTFOOT --------------------------------------------------------------------------------  Indications:      Pre-CABG. Risk Factors:     Hypertension, hyperlipidemia, Diabetes, coronary artery                   disease. Limitations:      Line in right neck/bandaging, Left TR band Comparison Study: No prior study Performing Technologist: Carlos Levering RVT  Examination Guidelines: A complete evaluation includes B-mode imaging, spectral Doppler, color Doppler, and power Doppler as needed of all accessible portions of each vessel. Bilateral testing is considered an integral part of a complete examination. Limited examinations for reoccurring indications may be performed  as noted.  Right Carotid Findings: +----------+--------+--------+--------+--------+-------------------------------+           PSV cm/sEDV cm/sStenosisDescribeComments                        +----------+--------+--------+--------+--------+-------------------------------+ CCA Prox                                  Unable to visualize due to                                                line/bandaging                   +----------+--------+--------+--------+--------+-------------------------------+ CCA Distal                                Unable to visualize due to                                                line/bandaging                  +----------+--------+--------+--------+--------+-------------------------------+ ICA Prox  71      29                                                      +----------+--------+--------+--------+--------+-------------------------------+ ICA Distal84      28                                                      +----------+--------+--------+--------+--------+-------------------------------+ ECA                                       Unable to visualize due to                                                line/bandaging                  +----------+--------+--------+--------+--------+-------------------------------+ Portions of this table do not appear on this page. +----------+-------+-------+--------------------------------------+------------+           PSV    EDV cmsDescribe                              Arm Pressure           cm/s                                                             +----------+-------+-------+--------------------------------------+------------+  Subclavian              Unable to visualize due to                                                 line/bandaging                                     +----------+-------+-------+--------------------------------------+------------+ +---------+--------+--+--------+--+---------+ VertebralPSV cm/s73EDV cm/s23Antegrade +---------+--------+--+--------+--+---------+ Left Carotid Findings: +----------+--------+--------+--------+-------------------------------+--------+           PSV cm/sEDV cm/sStenosisDescribe                       Comments +----------+--------+--------+--------+-------------------------------+--------+ CCA Prox  63      18                                                       +----------+--------+--------+--------+-------------------------------+--------+ CCA Distal70      20              heterogenous                            +----------+--------+--------+--------+-------------------------------+--------+ ICA Prox  113     40      40-59%  calcific, hypoechoic and                                                  heterogenous                            +----------+--------+--------+--------+-------------------------------+--------+ ICA Mid   103     31                                                      +----------+--------+--------+--------+-------------------------------+--------+ ICA Distal100     32                                                      +----------+--------+--------+--------+-------------------------------+--------+ ECA       123     7                                                       +----------+--------+--------+--------+-------------------------------+--------+ +----------+--------+--------+----------------+------------+ SubclavianPSV cm/sEDV cm/sDescribe        Arm Pressure +----------+--------+--------+----------------+------------+           125             Multiphasic, WNL             +----------+--------+--------+----------------+------------+ +---------+--------+--+--------+--+---------+  VertebralPSV cm/s52EDV cm/s12Antegrade +---------+--------+--+--------+--+---------+  ABI Findings: +--------+------------------+-----+---------+--------+ Right   Rt Pressure (mmHg)IndexWaveform Comment  +--------+------------------+-----+---------+--------+ [id number]                    triphasic         +--------+------------------+-----+---------+--------+ PTA     124               0.89 triphasic         +--------+------------------+-----+---------+--------+ DP      138               0.99 triphasic          +--------+------------------+-----+---------+--------+ +----+------------------+-----+---------+-------+ LeftLt Pressure (mmHg)IndexWaveform Comment +----+------------------+-----+---------+-------+ PTA 106               0.76 triphasic        +----+------------------+-----+---------+-------+ DP  120               0.86 triphasic        +----+------------------+-----+---------+-------+ +-------+---------------+----------------+ ABI/TBIToday's ABI/TBIPrevious ABI/TBI +-------+---------------+----------------+ Right  0.99                            +-------+---------------+----------------+ Left   0.86                            +-------+---------------+----------------+  Right Doppler Findings: +--------+--------+-----+---------+--------+ Site    PressureIndexDoppler  Comments +--------+--------+-----+---------+--------+ [id number]          triphasic         +--------+--------+-----+---------+--------+ Radial               triphasic         +--------+--------+-----+---------+--------+ Ulnar                triphasic         +--------+--------+-----+---------+--------+   Summary: Right Carotid: Limited evaluation secondary to line/bandaging. Velocities in the                right ICA are consistent with a 1-39% stenosis. Left Carotid: Velocities in the left ICA are consistent with a 40-59% stenosis,               lower range of scale. Vertebrals:  Bilateral vertebral arteries demonstrate antegrade flow. Subclavians: Right subclavian artery was not visualized. Normal flow              hemodynamics were seen in the left subclavian artery. Right ABI: Resting right ankle-brachial index is within normal range. No evidence of significant right lower extremity arterial disease. Left ABI: Resting left ankle-brachial index indicates mild left lower extremity arterial disease. Right Upper Extremity: Doppler waveforms remain within normal limits with right radial compression.  Doppler waveforms remain within normal limits with right ulnar compression.  Electronically signed by Ruta Hinds MD on 08/22/2020 at 7:50:23 PM.    Final      Discharge Medications: Allergies as of 08/31/2020   No Known Allergies      Medication List     STOP taking these medications    aspirin 81 MG tablet Replaced by: aspirin EC 81 MG tablet   losartan-hydrochlorothiazide 100-25 MG tablet Commonly known as: HYZAAR       TAKE these medications    Accu-Chek Guide test strip Generic drug: glucose blood 1 each by Other route 2 (two) times daily. And lancets 2/day   Accu-Chek Guide w/Device Kit USE AS DIRECTED   Accu-Chek Softclix Lancets  lancets Use to check blood sugar three times daily.   aspirin EC 81 MG tablet Take 1 tablet (81 mg total) by mouth daily. Swallow whole. Start taking on: September 01, 2020 Replaces: aspirin 81 MG tablet   bisoprolol 5 MG tablet Commonly known as: ZEBETA Take 0.5 tablets (2.5 mg total) by mouth daily.   clopidogrel 75 MG tablet Commonly known as: Plavix Take 1 tablet (75 mg total) by mouth daily. Start taking on: September 01, 2020   dapagliflozin propanediol 10 MG Tabs tablet Commonly known as: FARXIGA Take 1 tablet (10 mg total) by mouth daily.   gabapentin 100 MG capsule Commonly known as: NEURONTIN TAKE 1 CAPSULE (100 MG TOTAL) BY MOUTH 3 (THREE) TIMES DAILY.   insulin glargine 100 UNIT/ML Solostar Pen Commonly known as: LANTUS Inject 20 Units into the skin every morning. What changed:  how much to take when to take this   ivabradine 5 MG Tabs tablet Commonly known as: CORLANOR Take 1 tablet (5 mg total) by mouth 2 (two) times daily with a meal.   midodrine 2.5 MG tablet Commonly known as: PROAMATINE Take 1 tablet (2.5 mg total) by mouth 3 (three) times daily with meals.   oxyCODONE 5 MG immediate release tablet Commonly known as: Oxy IR/ROXICODONE Take 1 tablet (5 mg total) by mouth every 6 (six) hours as needed  for severe pain.   rosuvastatin 40 MG tablet Commonly known as: CRESTOR Take 1 tablet (40 mg total) by mouth at bedtime. What changed:  medication strength how much to take when to take this   TRUEplus Pen Needles 32G X 4 MM Misc Generic drug: Insulin Pen Needle Use as instructed to inject Lantus and Victoza once daily.               Durable Medical Equipment  (From admission, onward)           Start     Ordered   08/31/20 1003  For home use only DME 3 n 1  Once        08/31/20 1002   08/31/20 1003  For home use only DME 4 wheeled rolling walker with seat  Once       Question:  Patient needs a walker to treat with the following condition  Answer:  S/P CABG x 3   08/31/20 1002           The patient has been discharged on:   1.Beta Blocker:  Yes [  yes ]                              No   [   ]                              If No, reason:  2.Ace Inhibitor/ARB: Yes [   ]                                     No  [  no  ]                                     If No, reason: on midodrine  3.Statin:   Yes [  yes ]  No  [   ]                  If No, reason:  4.Ecasa:  Yes  [ yes  ]                  No   [   ]                  If No, reason:  Patient had ACS upon admission:  Plavix/P2Y12 inhibitor: Yes [  yes ]                                      No  [   ]   Follow Up Appointments:  Follow-up Information     Lightfoot, Lucile Crater, MD Follow up.   Specialty: Cardiothoracic Surgery Why: Your VIRTUAL follow-up appointment is on: 09/09/2020 _0 :30pm.Please do not come to the office for this appointment.  Your IN-PERSON appointment is on 09/30/2020 _1 :50am. Please DO come to the office for this appointment Contact information: [place] [address] [phone]         Kerin Perna, NP. Call.   Specialty: Internal Medicine Why: for a follow up regarding further diabetes management and surveillance of HGA1C  9.4 Contact information: [address] [place] [address] [phone]         Larey Dresser, MD Follow up.   Specialty: Cardiology Contact information: [place] [address] [phone]         Nigel Mormon, MD. Call today.   Specialties: Cardiology, Radiology Contact information: [place] [address] [place], [place] Follow up.   Specialty: Home Health Services Why: Home Health RN and Home Health Physical Therapy will call with appt time for visit Contact information: [address] [place] [address] [phone]                 Signed: Terance Hart ContePA-C 08/31/2020, 10:38 AM

## 2020-08-25 ENCOUNTER — Inpatient Hospital Stay (HOSPITAL_COMMUNITY): Payer: Medicare HMO

## 2020-08-25 DIAGNOSIS — I5043 Acute on chronic combined systolic (congestive) and diastolic (congestive) heart failure: Secondary | ICD-10-CM | POA: Diagnosis not present

## 2020-08-25 LAB — BASIC METABOLIC PANEL
Anion gap: 8 (ref 5–15)
BUN: 28 mg/dL — ABNORMAL HIGH (ref 8–23)
CO2: 25 mmol/L (ref 22–32)
Calcium: 8 mg/dL — ABNORMAL LOW (ref 8.9–10.3)
Chloride: 105 mmol/L (ref 98–111)
Creatinine, Ser: 1.51 mg/dL — ABNORMAL HIGH (ref 0.61–1.24)
GFR, Estimated: 49 mL/min — ABNORMAL LOW (ref >60)
Glucose, Bld: 119 mg/dL — ABNORMAL HIGH (ref 70–99)
Potassium: 4.8 mmol/L (ref 3.5–5.1)
Sodium: 138 mmol/L (ref 135–145)

## 2020-08-25 LAB — CBC
HCT: 28.9 % — ABNORMAL LOW (ref 39.0–52.0)
Hemoglobin: 9.4 g/dL — ABNORMAL LOW (ref 13.0–17.0)
MCH: 31.2 pg (ref 26.0–34.0)
MCHC: 32.5 g/dL (ref 30.0–36.0)
MCV: 96 fL (ref 80.0–100.0)
Platelets: 113 10*3/uL — ABNORMAL LOW (ref 150–400)
RBC: 3.01 MIL/uL — ABNORMAL LOW (ref 4.22–5.81)
RDW: 13.4 % (ref 11.5–15.5)
WBC: 9.6 10*3/uL (ref 4.0–10.5)
nRBC: 0 % (ref 0.0–0.2)

## 2020-08-25 LAB — GLUCOSE, CAPILLARY
Glucose-Capillary: 129 mg/dL — ABNORMAL HIGH (ref 70–99)
Glucose-Capillary: 98 mg/dL (ref 70–99)
Glucose-Capillary: 149 mg/dL — ABNORMAL HIGH (ref 70–99)
Glucose-Capillary: 121 mg/dL — ABNORMAL HIGH (ref 70–99)
Glucose-Capillary: 99 mg/dL (ref 70–99)
Glucose-Capillary: 121 mg/dL — ABNORMAL HIGH (ref 70–99)

## 2020-08-25 LAB — COOXEMETRY PANEL
Carboxyhemoglobin: 1 % (ref 0.5–1.5)
Methemoglobin: 0.7 % (ref 0.0–1.5)
O2 Saturation: 59.3 %
Total hemoglobin: 9.7 g/dL — ABNORMAL LOW (ref 12.0–16.0)

## 2020-08-25 MED ORDER — FUROSEMIDE 10 MG/ML IJ SOLN
40.0000 mg | Freq: Once | INTRAMUSCULAR | Status: AC
  Administered 2020-08-25: 40 mg via INTRAVENOUS
  Filled 2020-08-25: qty 4

## 2020-08-25 MED FILL — Electrolyte-R (PH 7.4) Solution: INTRAVENOUS | Qty: 3000 | Status: AC

## 2020-08-25 MED FILL — Heparin Sodium (Porcine) Inj 1000 Unit/ML: INTRAMUSCULAR | Qty: 30 | Status: AC

## 2020-08-25 MED FILL — Sodium Bicarbonate IV Soln 8.4%: INTRAVENOUS | Qty: 50 | Status: AC

## 2020-08-25 MED FILL — Potassium Chloride Inj 2 mEq/ML: INTRAVENOUS | Qty: 40 | Status: AC

## 2020-08-25 MED FILL — Mannitol IV Soln 20%: INTRAVENOUS | Qty: 500 | Status: AC

## 2020-08-25 MED FILL — Heparin Sodium (Porcine) Inj 1000 Unit/ML: INTRAMUSCULAR | Qty: 10 | Status: AC

## 2020-08-25 MED FILL — Calcium Chloride Inj 10%: INTRAVENOUS | Qty: 10 | Status: AC

## 2020-08-25 MED FILL — Sodium Chloride IV Soln 0.9%: INTRAVENOUS | Qty: 2000 | Status: AC

## 2020-08-25 MED FILL — Lidocaine HCl Local Preservative Free (PF) Inj 2%: INTRAMUSCULAR | Qty: 15 | Status: AC

## 2020-08-25 NOTE — Evaluation (Signed)
Physical Therapy Evaluation Patient Details Name: Colin [name] MRN: [id number] DOB: 21-Dec-1950 Today's Date: 08/25/2020   History of Present Illness  70y/o male admitted 7/16 with chest pain, NSTEMI, and new onset CHF. Pt presented with periodic chest pain, SOB, and weakness. 7/18: cath and temporary. 7/19: CABG x3. PMH includes HTN, HLD, acute HF, and DM  Clinical Impression  Pt awake/alert during session. Suggest hand gestures and multimodal cues to improve communication. Pt requires mod to min A with bed mobility, transfers, and gait training with multimodal cues. Pt has deficits with activity tolerance, strength, mobility, and awareness of precautions. Pt would benefit from continuation of PT to improve bed mobility, transfers, and gait. Recommend HH upon discharge.    Follow Up Recommendations Home health PT    Equipment Recommendations  None recommended by PT    Recommendations for Other Services       Precautions / Restrictions Precautions Precautions: Fall;Sternal Restrictions Weight Bearing Restrictions: No      Mobility  Bed Mobility Overal bed mobility: Needs Assistance Bed Mobility: Supine to Sit     Supine to sit: Mod assist;+2 for safety/equipment;+2 for physical assistance     General bed mobility comments: Pt in bed upon arrival. Resting vitals - HR 87, BP 112/64, spO2 96 on 2LPM. mod A +2 to lift trunk, guide LEs toward EOB, and ensure pt following sternal precautions. Multimodal cues provided.    Transfers Overall transfer level: Needs assistance Equipment used: Rolling walker (2 wheeled) Transfers: Sit to/from Stand Sit to Stand: Min assist         General transfer comment: Vitals EOB- HR 86, spO2 92 on room air. min A to power up to standing. Multimodal cues provided to maintain sternal precautions.  Ambulation/Gait Ambulation/Gait assistance: Min assist Gait Distance (Feet): 150 Feet Assistive device: Rolling walker (2 wheeled) Gait  Pattern/deviations: Decreased step length - left;Decreased step length - right;Step-through pattern;Staggering left;Staggering right   Gait velocity interpretation: <1.8 ft/sec, indicate of risk for recurrent falls General Gait Details: desat to 87% prior to ambulation. Increased O2 to 1LPM to maintain sats >90%. minA to keep hips in RW. Verbal cues provided to maintain upright position and greater step length. Pt mentioned feeling weak during ambulation. Sat in recliner and wheeled back to room. Left on 1LPM O2. Ending vitals - JR 87, spO2, 96% BP 133/62(82).  Stairs            Wheelchair Mobility    Modified Rankin (Stroke Patients Only)       Balance Overall balance assessment: No apparent balance deficits (not formally assessed)                                           Pertinent Vitals/Pain Pain Assessment: 0-10 Pain Score: 5  Pain Location: chest Pain Descriptors / Indicators: Moaning Pain Intervention(s): Monitored during session;Premedicated before session;Limited activity within patient's tolerance;Repositioned    Home Living Family/patient expects to be discharged to:: Private residence Living Arrangements: Spouse/significant other;Children Available Help at Discharge: Other (Comment) (Primary caregiver. Not sure if wife or son can help when returning home. Wife on dialysis.) Type of Home: House Home Access: Stairs to enter   [employer] of Steps: Unknown doesn't use upper level Home Layout: Two level;Able to live on main level with bedroom/bathroom Home Equipment: Wheelchair - [employer] - 2 wheels;Bedside commode      Prior  Function Level of Independence: Independent               Hand Dominance        Extremity/Trunk Assessment                Communication   Communication: Prefers language other than [place];Other (comment) (Use hand gestures to improve communication.)  Cognition Arousal/Alertness:  Awake/alert Behavior During Therapy: WFL for tasks assessed/performed Overall Cognitive Status: No family/caregiver present to determine baseline cognitive functioning                                 General Comments: Pt awake and alert during session. Responded to commands accurately when multimodal cues provided. Pt demonstrates decreased awareness of precautions. Nurse speaking to son about precautions.      General Comments      Exercises     Assessment/Plan    PT Assessment Patient needs continued PT services  PT Problem List Decreased mobility;Decreased activity tolerance;Decreased knowledge of precautions;Decreased strength       PT Treatment Interventions Gait training;Therapeutic activities;Functional mobility training    PT Goals (Current goals can be found in the Care Plan section)  Acute Rehab PT Goals Patient Stated Goal: to return home. PT Goal Formulation: With patient Time For Goal Achievement: 09/08/20 Potential to Achieve Goals: Good    Frequency Min 3X/week   Barriers to discharge        Co-evaluation               AM-PAC PT "6 Clicks" Mobility  Outcome Measure Help needed turning from your back to your side while in a flat bed without using bedrails?: A Lot Help needed moving from lying on your back to sitting on the side of a flat bed without using bedrails?: A Lot Help needed moving to and from a bed to a chair (including a wheelchair)?: A Little Help needed standing up from a chair using your arms (e.g., wheelchair or bedside chair)?: A Little Help needed to walk in hospital room?: A Little Help needed climbing 3-5 steps with a railing? : A Lot 6 Click Score: 15    End of Session Equipment Utilized During Treatment: Gait belt;Oxygen Activity Tolerance: Patient limited by pain;Patient limited by fatigue Patient left: in chair;with call bell/phone within reach;with chair alarm set Nurse Communication: Mobility  status;Precautions PT Visit Diagnosis: Other abnormalities of gait and mobility (R26.89);Muscle weakness (generalized) (M62.81)    Time: 9381-0175 PT Time Calculation (min) (ACUTE ONLY): 40 min   Charges:   PT Evaluation $PT Eval Moderate Complexity: 1 Mod PT Treatments $Gait Training: 8-22 mins $Therapeutic Activity: 8-22 mins        Velda Shell, SPT Acute Rehab: [phone]   Vance Gather 08/25/2020, 10:02 AM

## 2020-08-25 NOTE — Progress Notes (Signed)
2 Days Post-Op Procedure(s) (LRB): CORONARY ARTERY BYPASS GRAFTING (CABG) x 3 ON CARDIOPULMONARY BYPASS USING EVH AND PARTIALLY OPEN HARVESTED LEFT GREATER SAPHENOUS VEIN AND LEFT INTERNAL MAMMARY ARTERY.  LIMA TO LAD, SVG TO OM, SVG TO PDA (N/A) TRANSESOPHAGEAL ECHOCARDIOGRAM (TEE) (N/A) APPLICATION OF CELL SAVER (N/A) ENDOVEIN HARVEST AND PARTIALLY OPEN GREATER SAPHENOUS VEIN HARVESTING (Left) Subjective: C/o pain  Objective: Vital signs in last 24 hours: Temp:  [97.9 F (36.6 C)-98.9 F (37.2 C)] 97.9 F (36.6 C) (07/21 0400) Pulse Rate:  [78-91] 84 (07/21 0700) Cardiac Rhythm: Normal sinus rhythm (07/21 0600) Resp:  [11-27] 24 (07/21 0700) BP: (83-108)/(51-78) 105/66 (07/21 0700) SpO2:  [87 %-100 %] 93 % (07/21 0700) Arterial Line BP: (110-139)/(41-48) 139/48 (07/20 1200) Weight:  [64.3 kg] 64.3 kg (07/21 0500)  Hemodynamic parameters for last 24 hours: CVP:  [6 mmHg-10 mmHg] 10 mmHg  Intake/Output from previous day: 07/20 0701 - 07/21 0700 In: 1293.8 [P.O.:240; I.V.:703.8; IV Piggyback:350] Out: 1060 [Urine:860; Chest Tube:200] Intake/Output this shift: No intake/output data recorded.  General appearance: alert, cooperative, and no distress Neurologic: intact Heart: regular rate and rhythm Lungs: diminished breath sounds bibasilar Abdomen: normal findings: soft, non-tender  Lab Results: Recent Labs    08/24/20 1619 08/25/20 0431  WBC 8.5 9.6  HGB 9.7* 9.4*  HCT 29.8* 28.9*  PLT 121* 113*   BMET:  Recent Labs    08/24/20 1619 08/25/20 0431  NA 138 138  K 5.4* 4.8  CL 107 105  CO2 25 25  GLUCOSE 127* 119*  BUN 27* 28*  CREATININE 1.60* 1.51*  CALCIUM 8.0* 8.0*    PT/INR:  Recent Labs    08/23/20 1750  LABPROT 16.3*  INR 1.3*   ABG    Component Value Date/Time   PHART 7.366 08/24/2020 0030   HCO3 20.4 08/24/2020 0030   TCO2 21 (L) 08/24/2020 0030   ACIDBASEDEF 4.0 (H) 08/24/2020 0030   O2SAT 59.3 08/25/2020 0445   CBG (last 3)  Recent  Labs    08/24/20 2302 08/25/20 0359 08/25/20 0658  GLUCAP 162* 129* 98    Assessment/Plan: S/P Procedure(s) (LRB): CORONARY ARTERY BYPASS GRAFTING (CABG) x 3 ON CARDIOPULMONARY BYPASS USING EVH AND PARTIALLY OPEN HARVESTED LEFT GREATER SAPHENOUS VEIN AND LEFT INTERNAL MAMMARY ARTERY.  LIMA TO LAD, SVG TO OM, SVG TO PDA (N/A) TRANSESOPHAGEAL ECHOCARDIOGRAM (TEE) (N/A) APPLICATION OF CELL SAVER (N/A) ENDOVEIN HARVEST AND PARTIALLY OPEN GREATER SAPHENOUS VEIN HARVESTING (Left) - NEURO- no focal deficit CV- in SR. Co-ox 59 on dobutamine at 3 mcg/kg/min  Followed by AHF team  Check CVP  ASA, statin, beta blocker RESP- Small effusions, some atelectasis, interstitial prominence  Diurese, pulmonary hygiene RENAL- creatinine down slightly, lytes OK  Diurese for volume overload ENDO- CBG OK GI- tolerating PO but intake relatively light Deconditioning- ambulate DC chest tubes Enoxaparin + SCD for DVT prophylaxis, PLT slightly lwo but > 100K and no signs of bleeding   LOS: 5 days    Loreli Slot 08/25/2020

## 2020-08-25 NOTE — Progress Notes (Signed)
      [address]       [place] [address]             [phone]      No complaints this evening  BP (!) 103/55   Pulse 82   Temp 97.8 F (36.6 C) (Oral)   Resp 17   Ht 5\' 4"  (1.626 m)   Wt 64.3 kg   SpO2 98%   BMI 24.33 kg/m   Intake/Output Summary (Last 24 hours) at 08/25/2020 1801 Last data filed at 08/25/2020 1600 Gross per 24 hour  Intake 797.7 ml  Output 1720 ml  Net -922.3 ml   Worked with PT some today  08/27/2020 C. Viviann Spare, MD [facility] [phone]

## 2020-08-25 NOTE — Progress Notes (Addendum)
Advanced Heart Failure Rounding Note  PCP-Cardiologist: None   Subjective:    POD 2 after CABG x3 (LIMA-LAD, SVG-OM, SVG-PDA).  On DBA 3. Off NE. Co-ox 59%. 14 lb above pre-op wt. NSR on tele.   Complaining of sternal incision pain.     Objective:   Weight Range: 64.3 kg Body mass index is 24.33 kg/m.   Vital Signs:   Temp:  [97.9 F (36.6 C)-98.9 F (37.2 C)] 97.9 F (36.6 C) (07/21 0400) Pulse Rate:  [78-92] 84 (07/21 0600) Resp:  [11-27] 23 (07/21 0600) BP: (83-108)/(51-78) 105/78 (07/21 0600) SpO2:  [87 %-100 %] 87 % (07/21 0600) Arterial Line BP: (110-139)/(41-48) 139/48 (07/20 1200) Weight:  [64.3 kg] 64.3 kg (07/21 0500) Last BM Date: 08/19/20  Weight change: Filed Weights   08/23/20 0500 08/24/20 0500 08/25/20 0500  Weight: 58.3 kg 61.7 kg 64.3 kg    Intake/Output:   Intake/Output Summary (Last 24 hours) at 08/25/2020 0109 Last data filed at 08/25/2020 0600 Gross per 24 hour  Intake 1293.79 ml  Output 1060 ml  Net 233.79 ml      Physical Exam    General:  Well appearing, sitting up in bed. No respiratory difficulty HEENT: normal Neck: supple. JVD not well visualized. Carotids 2+ bilat; no bruits. No lymphadenopathy or thyromegaly appreciated. Cor: PMI nondisplaced. Regular rate & rhythm. No rubs, gallops or murmurs. + Sternal dressing. + CTs Lungs: decreased BS at the bases bilaterally  Abdomen: soft, nontender, nondistended. No hepatosplenomegaly. No bruits or masses. Good bowel sounds. Extremities: no cyanosis, clubbing, rash, edema Neuro: alert & oriented x 3, cranial nerves grossly intact. moves all 4 extremities w/o difficulty. Affect pleasant.   Telemetry    NSR 80s Personally reviewed    EKG    No new EKG to review   Labs    CBC Recent Labs    08/24/20 1619 08/25/20 0431  WBC 8.5 9.6  HGB 9.7* 9.4*  HCT 29.8* 28.9*  MCV 95.5 96.0  PLT 121* 323*   Basic Metabolic Panel Recent Labs    08/24/20 0413 08/24/20 1619  08/25/20 0431  NA 137 138 138  K 4.8 5.4* 4.8  CL 109 107 105  CO2 23 25 25   GLUCOSE 143* 127* 119*  BUN 23 27* 28*  CREATININE 1.36* 1.60* 1.51*  CALCIUM 8.1* 8.0* 8.0*  MG 2.9* 2.7*  --    Liver Function Tests Recent Labs    08/23/20 0927  AST 17  ALT 17  ALKPHOS 61  BILITOT 0.5  PROT 5.6*  ALBUMIN 2.6*   No results for input(s): LIPASE, AMYLASE in the last 72 hours. Cardiac Enzymes No results for input(s): CKTOTAL, CKMB, CKMBINDEX, TROPONINI in the last 72 hours.  BNP: BNP (last 3 results) Recent Labs    08/20/20 0449 08/22/20 0312  BNP 1,079.7* 726.1*    ProBNP (last 3 results) No results for input(s): PROBNP in the last 8760 hours.   D-Dimer No results for input(s): DDIMER in the last 72 hours. Hemoglobin A1C Recent Labs    08/23/20 0720  HGBA1C 9.4*   Fasting Lipid Panel No results for input(s): CHOL, HDL, LDLCALC, TRIG, CHOLHDL, LDLDIRECT in the last 72 hours.  Thyroid Function Tests No results for input(s): TSH, T4TOTAL, T3FREE, THYROIDAB in the last 72 hours.  Invalid input(s): FREET3  Other results:   Imaging    No results found.   Medications:     Scheduled Medications:  acetaminophen  1,000 mg Oral Q6H  Or   acetaminophen (TYLENOL) oral liquid 160 mg/5 mL  1,000 mg Per Tube Q6H   acetaminophen (TYLENOL) oral liquid 160 mg/5 mL  650 mg Per Tube Once   Or   acetaminophen  650 mg Rectal Once   aspirin EC  325 mg Oral Daily   Or   aspirin  324 mg Per Tube Daily   bisacodyl  10 mg Oral Daily   Or   bisacodyl  10 mg Rectal Daily   chlorhexidine gluconate (MEDLINE KIT)  15 mL Mouth Rinse BID   Chlorhexidine Gluconate Cloth  6 each Topical Daily   Chlorhexidine Gluconate Cloth  6 each Topical Q0600   docusate sodium  200 mg Oral Daily   enoxaparin (LOVENOX) injection  40 mg Subcutaneous QHS   feeding supplement (GLUCERNA SHAKE)  237 mL Oral TID BM   gabapentin  100 mg Oral TID   insulin aspart  0-24 Units Subcutaneous Q4H    insulin detemir  10 Units Subcutaneous Daily   midodrine  5 mg Oral TID WC   multivitamin with minerals  1 tablet Oral Daily   pantoprazole  40 mg Oral Daily   rosuvastatin  40 mg Oral QHS   sodium chloride flush  10-40 mL Intracatheter Q12H   sodium chloride flush  3 mL Intravenous Q12H    Infusions:  sodium chloride Stopped (08/24/20 1126)   sodium chloride     sodium chloride      ceFAZolin (ANCEF) IV 2 g (08/25/20 0640)   DOBUTamine 3 mcg/kg/min (08/25/20 0600)   insulin Stopped (08/24/20 1246)   lactated ringers     lactated ringers 20 mL/hr at 08/25/20 0600   lactated ringers     norepinephrine (LEVOPHED) Adult infusion Stopped (08/24/20 1127)    PRN Medications: sodium chloride, dextrose, lactated ringers, morphine injection, ondansetron (ZOFRAN) IV, oxyCODONE, sodium chloride flush, sodium chloride flush, traMADol    Patient Profile   The patient is a 70 y/o Asian male with a medical history significant for hypertension, hyperlipidemia and diabetes who presented to [facility] for evaluation of chest pain.  He ruled in for an NSTEMI and underwent a cardiac catheterization that revealed multivessel CAD.  He will undergo a CABG on 08/23/2020.   Assessment/Plan   1. CAD:  Remote PCI to LCx and RCA.  NSTEMI this admission, found to have 3VD with occluded RCA with collaterals, 80% long proximal LAD stenosis, 90% stenosis proximal LCx and OM1 (relatively small caliber).  He is a diabetic with low EF.  Echo shows diffuse moderate hypokinesis, there is no thinning or akinesis, so suspect significant viability. - s/p CABG x3 on 08/23/2020  - Beta blocker held due to long pause -- will continue to hold until appropriate post-operative period  - Continue aspirin and statin  2. Acute systolic CHF: Echo with EF 30-35%, normal RV, moderate MR.  Ischemic cardiomyopathy. [place] on 08/22/2020 showed near-normal filling pressures with preserved cardiac output. Now s/p CABG. On DBA 3. Off NE. Co-ox 59%.  Wt up 14 lb.  - Continue dobutamine 3 until diuresed, will follow co-ox.  - Creatinine fairly stable (has been ~1.3 from 1.49). 1.5 today. - Start diuresis today, IV Lasix 40 mg x 1  - Hold off on beta blocker for now with long sinus pause. - He can continue dapagliflozin - Do not think he will need MV repair, suspect functional and will improve with improvement in LV function 3. Sinus pause: 5 second pause on 7/18 -- TTVP placed,  but now with pacing wires postoperatively.  Rhythm appears stable currently with no further pauses noted.  4. Type 2 diabetes: Insulin. 5. 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  Also with ulcerated plaque in infrarenal aorta. - Pre-CABG ultrasounds as noted above  6. Smoking: Urged him to quit   Length of Stay: [address] [place]  08/25/2020, 7:12 AM  Patient seen with PA, agree with the above note.   He remains in NSR.  On dobutamine 3 with co-ox 59%.  SBP 90s-100s.  No CVP but weight up.   Complains of pain primarily, denies dyspnea.   General: NAD Neck: JVP difficult, no thyromegaly or thyroid nodule.  Lungs: Clear to auscultation bilaterally with normal respiratory effort. CV: Nondisplaced PMI.  Heart regular S1/S2, no S3/S4, no murmur.  No peripheral edema.   Abdomen: Soft, nontender, no hepatosplenomegaly, no distention.  Skin: Intact without lesions or rashes.  Neurologic: Alert and oriented x 3.  Psych: Normal affect. Extremities: No clubbing or cyanosis.  HEENT: Normal.   Would continue currently dobutamine today, he is now on midodrine 5 tid.  Will give dose of Lasix 40 mg IV and set up CVP.  Creatinine down to 1.5 from 1.6 yesterday pm.   He is staying in NSR without heart block or pauses so far.   CRITICAL CARE Performed by: Loralie Champagne  Total critical care time: 35 minutes  Critical care time was exclusive of separately billable procedures and treating other patients.  Critical  care was necessary to treat or prevent imminent or life-threatening deterioration.  Critical care was time spent personally by me on the following activities: development of treatment plan with patient and/or surrogate as well as nursing, discussions with consultants, evaluation of patient's response to treatment, examination of patient, obtaining history from patient or surrogate, ordering and performing treatments and interventions, ordering and review of laboratory studies, ordering and review of radiographic studies, pulse oximetry and re-evaluation of patient's condition.  Loralie Champagne 08/25/2020 7:34 AM

## 2020-08-25 NOTE — Progress Notes (Signed)
Progress Note  Patient Name: Colin [name] Date of Encounter: 08/25/2020  Attending physician: Lajuana Matte, MD Primary care provider: Kerin Perna, NP   Subjective: Colin [name] is a 70 y.o. male who was seen and examined at bedside Just finished working with PT, ambulated for at least 150 feet. Some precordial discomfort at the sternotomy site. Sitting upright in chair Denies shortness of breath. Currently on dobutamine drip. Co-Ox 59%  Objective: Vital Signs in the last 24 hours: Temp:  [97.8 F (36.6 C)-99 F (37.2 C)] 97.8 F (36.6 C) (07/21 1542) Pulse Rate:  [81-88] 82 (07/21 1600) Resp:  [9-26] 17 (07/21 1600) BP: (93-122)/(51-78) 103/55 (07/21 1600) SpO2:  [87 %-99 %] 98 % (07/21 1600) Weight:  [64.3 kg] 64.3 kg (07/21 0500)  Intake/Output:  Intake/Output Summary (Last 24 hours) at 08/25/2020 1828 Last data filed at 08/25/2020 1600 Gross per 24 hour  Intake 797.7 ml  Output 1720 ml  Net -922.3 ml    Net IO Since Admission: -6,912.67 mL [08/25/20 1828]  Weights:  Filed Weights   08/23/20 0500 08/24/20 0500 08/25/20 0500  Weight: 58.3 kg 61.7 kg 64.3 kg    Telemetry: Personally reviewed.  Normal sinus rhythm without any significant dysrhythmias.  Physical examination: PHYSICAL EXAM: Vitals with BMI 08/25/2020 08/25/2020 08/25/2020  Height - - -  Weight - - -  BMI - - -  Systolic [phone]  Diastolic 55 58 58  Pulse 82 83 81    CONSTITUTIONAL: Hemodynamically stable, sitting upright in chair, no acute distress SKIN: Skin is warm and dry. No rash noted. No cyanosis. No pallor. No jaundice HEAD: Normocephalic and atraumatic.  EYES: No scleral icterus MOUTH/THROAT: Moist oral membranes.  NECK: JVD difficult to assess secondary to bandages and central line in place. No thyromegaly noted. No carotid bruits  LYMPHATIC: No visible cervical adenopathy.  CHEST Normal respiratory effort. No intercostal retractions.  Sternotomy site dressing  present. LUNGS: Clear to auscultation bilaterally, no stridor. No wheezes. No rales.  CARDIOVASCULAR: Regular, positive S1-S2, no murmurs rubs or gallops appreciated. ABDOMINAL: No apparent ascites.  EXTREMITIES: No peripheral edema  HEMATOLOGIC: No significant bruising NEUROLOGIC: Oriented to person, place, and time. Nonfocal. Normal muscle tone.  PSYCHIATRIC: Normal mood and affect. Normal behavior. Cooperative  Lab Results: Hematology Recent Labs  Lab 08/24/20 0413 08/24/20 1619 08/25/20 0431  WBC 9.3 8.5 9.6  RBC 2.97* 3.12* 3.01*  HGB 9.4* 9.7* 9.4*  HCT 27.6* 29.8* 28.9*  MCV 92.9 95.5 96.0  MCH 31.6 31.1 31.2  MCHC 34.1 32.6 32.5  RDW 13.6 13.7 13.4  PLT 141* 121* 113*    Chemistry Recent Labs  Lab 08/23/20 0927 08/23/20 1237 08/24/20 0413 08/24/20 1619 08/25/20 0431  NA 140   < > 137 138 138  K 4.0   < > 4.8 5.4* 4.8  CL 111   < > 109 107 105  CO2 25  --  _0 GLUCOSE 117*   < > 143* 127* 119*  BUN 33*   < > 23 27* 28*  CREATININE 1.27*   < > 1.36* 1.60* 1.51*  CALCIUM 8.7*  --  8.1* 8.0* 8.0*  PROT 5.6*  --   --   --   --   ALBUMIN 2.6*  --   --   --   --   AST 17  --   --   --   --   ALT 17  --   --   --   --  ALKPHOS 61  --   --   --   --   BILITOT 0.5  --   --   --   --   GFRNONAA >60  --  56* 46* 49*  ANIONGAP 4*  --  _0 < > = values in this interval not displayed.     Cardiac Enzymes: Cardiac Panel (last 3 results) No results for input(s): CKTOTAL, CKMB, TROPONINIHS, RELINDX in the last 72 hours.  BNP (last 3 results) Recent Labs    08/20/20 0449 08/22/20 0312  BNP 1,079.7* 726.1*    ProBNP (last 3 results) No results for input(s): PROBNP in the last 8760 hours.   DDimer No results for input(s): DDIMER in the last 168 hours.   Hemoglobin A1c:  Lab Results  Component Value Date   HGBA1C 9.4 (H) 08/23/2020   MPG 223.08 08/23/2020    TSH No results for input(s): TSH in the last 8760 hours.  Lipid Panel  Lab  Results  Component Value Date   CHOL 187 08/21/2020   HDL 40 (L) 08/21/2020   LDLCALC 136 (H) 08/21/2020   LDLDIRECT 137.1 (H) 08/21/2020   TRIG 53 08/21/2020   CHOLHDL 4.7 08/21/2020    Imaging: DG Chest Port 1 View  Result Date: 08/25/2020 CLINICAL DATA:  Status post open heart surgery. EXAM: PORTABLE CHEST 1 VIEW COMPARISON:  08/24/2020. FINDINGS: Left IJ line and mediastinal drainage catheter stable position. Prior CABG. Cardiomegaly again noted. Interim increase in bilateral interstitial prominence suggesting interstitial edema. Persistent atelectasis/infiltrate right upper lung and left base. Small bilateral pleural effusions. No pneumothorax. IMPRESSION: 1. Left IJ line and mediastinal drainage catheter stable position. Prior CABG. Cardiomegaly again noted. 2. Interim increase in bilateral interstitial prominence and small bilateral pleural effusions. Findings consistent with CHF. 3. Persistent atelectasis/infiltrate right upper lobe and left lung base. Electronically Signed   By: Marcello Moores  Register   On: 08/25/2020 07:24   DG Chest Port 1 View  Result Date: 08/24/2020 CLINICAL DATA:  Open-heart surgery. EXAM: PORTABLE CHEST 1 VIEW COMPARISON:  08/23/2020. FINDINGS: Interim removal of endotracheal tube and NG tube. Left IJ line and mediastinal drainage catheter in stable position. Prior CABG. Cardiomegaly with mild pulmonary venous congestion noted on today's exam. Atelectatic changes right upper lung. Atelectatic changes and or infiltrate left lung base. Small bilateral pleural effusions. No pneumothorax. IMPRESSION: 1. Interim removal of endotracheal tube and NG tube. Left IJ line mediastinal drainage catheter stable position. No pneumothorax. 2. Prior CABG. Cardiomegaly with mild pulmonary venous congestion noted on today's exam. 3. Atelectatic changes right upper lung. Atelectatic changes and or infiltrate left lung base. Small bilateral pleural effusions. Electronically Signed   By: Marcello Moores   Register   On: 08/24/2020 06:30    Cardiac database: Coronary artery bypass grafting: 08/23/2020. Surgeon: Dr. Kipp Brood LIMA to the LAD, SVG to OM, SVG to PDA  EKG: 05/06/2020: Normal sinus rhythm, 65 bpm, left axis deviation, old anteroseptal infarct, without underlying injury pattern.   08/20/2020: Normal sinus rhythm, 93 bpm, old anteroseptal infarct, ST-T changes in the lateral leads suggestive of ischemia, without underlying injury pattern.   Echocardiogram: 08/20/2020: 1. Left ventricular ejection fraction, by estimation, is 30 to 35%. The left ventricle has moderate to severely decreased function. The left ventricle demonstrates global hypokinesis. The left ventricular internal cavity size was mildly dilated. Left ventricular diastolic parameters are consistent with Grade II diastolic dysfunction (pseudonormalization). Elevated left atrial pressure.   2. Right ventricular systolic function is mildly  reduced. The right ventricular size is normal.   3. Left atrial size was mildly dilated.   4. The mitral valve is degenerative. Moderate mitral valve regurgitation. No evidence of mitral stenosis.   5. The aortic valve is tricuspid. Aortic valve regurgitation is not visualized. Mild aortic valve sclerosis is present, with no evidence of aortic valve stenosis.   6. The inferior vena cava is normal in size with greater than 50% respiratory variability, suggesting right atrial pressure of 3 mmHg.   7. Evidence of atrial level shunting detected by color flow Doppler.   Heart catheterization: 04/21/2010 by Dr. Burt Knack:  1. Successful stenting of the right coronary artery with overlapping long drug-eluting stents. 2. Negative pressure wire analysis of the LAD   CTA Chest / Abd/ Pelvis 08/20/2020: 1. Severe atherosclerosis of the thoracoabdominal aorta, including multiple ulcerated plaques in the infrarenal abdominal aorta. However, there is no evidence of thoracic aortic aneurysm  or dissection. 2. 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 3. Mild stenosis of the infrarenal abdominal aorta and moderate stenosis at the ostium of the inferior mesenteric artery. 4. Cardiomegaly with left ventricular dilatation. There is also evidence of interstitial pulmonary edema, along with moderate right and small left pleural effusions; imaging findings concerning for congestive heart failure. 5. Left main and 3 vessel coronary artery disease. Please note that although the presence of coronary artery calcium documents the presence of coronary artery disease, the severity of this disease and any potential stenosis cannot be assessed on this non-gated CT examination. Assessment for potential risk factor modification, dietary therapy or pharmacologic therapy may be warranted, if clinically indicated. 6. There are calcifications of the aortic valve. Echocardiographic correlation for evaluation of potential valvular dysfunction may be warranted if clinically indicated. 7. Additional incidental findings, as above.  Scheduled Meds:  acetaminophen  1,000 mg Oral Q6H   Or   acetaminophen (TYLENOL) oral liquid 160 mg/5 mL  1,000 mg Per Tube Q6H   acetaminophen (TYLENOL) oral liquid 160 mg/5 mL  650 mg Per Tube Once   Or   acetaminophen  650 mg Rectal Once   aspirin EC  325 mg Oral Daily   Or   aspirin  324 mg Per Tube Daily   bisacodyl  10 mg Oral Daily   Or   bisacodyl  10 mg Rectal Daily   chlorhexidine gluconate (MEDLINE KIT)  15 mL Mouth Rinse BID   Chlorhexidine Gluconate Cloth  6 each Topical Daily   Chlorhexidine Gluconate Cloth  6 each Topical Q0600   docusate sodium  200 mg Oral Daily   enoxaparin (LOVENOX) injection  40 mg Subcutaneous QHS   feeding supplement (GLUCERNA SHAKE)  237 mL Oral TID BM   gabapentin  100 mg Oral TID   insulin aspart  0-24 Units Subcutaneous Q4H   insulin detemir  10 Units  Subcutaneous Daily   midodrine  5 mg Oral TID WC   multivitamin with minerals  1 tablet Oral Daily   pantoprazole  40 mg Oral Daily   rosuvastatin  40 mg Oral QHS   sodium chloride flush  10-40 mL Intracatheter Q12H   sodium chloride flush  3 mL Intravenous Q12H    Continuous Infusions:  sodium chloride Stopped (08/24/20 1126)   sodium chloride     sodium chloride     DOBUTamine 3 mcg/kg/min (08/25/20 1600)   insulin Stopped (08/24/20 1246)   lactated ringers  lactated ringers 20 mL/hr at 08/25/20 1600   lactated ringers     norepinephrine (LEVOPHED) Adult infusion Stopped (08/24/20 1127)    PRN Meds: sodium chloride, dextrose, lactated ringers, morphine injection, ondansetron (ZOFRAN) IV, oxyCODONE, sodium chloride flush, sodium chloride flush, traMADol   IMPRESSION & RECOMMENDATIONS: Colin [name] is a 70 y.o. male whose past medical history and cardiac risk factors include: History of non-STEMI, established CAD w/ prior PCI to LCx and RCA, coronary artery calcification on non-gated CT study, IDDM Type 2, Hyperlipidemia, Smoking, Hypertension, Severe atherosclerosis of the thoracoabdominal aorta (CTA 08/20/2020).  Established coronary artery disease without angina pectoris, presenting as non-STEMI, underwent three-vessel bypass: Known history of CAD with poor outpatient follow-up presents with non-STEMI and found to have multivessel CAD Underwent three-vessel CABG 08/23/2020 Currently in CVICU being managed by cardiothoracic surgery and advanced heart failure team Clinically improving postoperatively. Currently on dobutamine drip given his ischemic cardiomyopathy and and acute HFrEF. Co-Ox 59%  Continue diuresis, weight still positive since his bypass surgery Diuresis per advanced heart failure Continue telemetry: Normal sinus rhythm without any dysrhythmias. Currently on aspirin and statin therapy. Not on beta-blocker therapy due to symptomatic pauses earlier this  admission No family present at bedside.  Acute HFrEF, stage C, NYHA class II/III: Currently on dobutamine for inotropic support and focusing on diuresis. Management per advanced heart failure team  Sinus pause: Prior to his left heart catheterization he had a symptomatic 5-second pause on 08/22/2020. No pauses noted on telemetry monitor closely.  Insulin-dependent diabetes mellitus type 2: Last hemoglobin A1c 9.4 Strict Glycemic control.  Currently on statin therapy, LDL currently not at goal.  Cigarette smoking: Currently smokes 0.5 packs/day prior to admission.  Educated on importance of complete smoking cessation.   Multivessel coronary artery calcification noted on not gated CT study: Continue aspirin and statin therapy.   Severe atherosclerosis of the thoracoabdominal aorta: Continue aspirin and statin therapy.  He is currently being managed by cardiothoracic surgery and advanced heart failure team their assistance is greatly appreciated.  We will continue to follow peripherally so that his cardiovascular care can be transitioned once he is transferred out of ICU as well as longitudinal care post discharge.  Patient's questions and concerns were addressed to his satisfaction. He voices understanding of the instructions provided during this encounter.   This note was created using a voice recognition software as a result there may be grammatical errors inadvertently enclosed that do not reflect the nature of this encounter. Every attempt is made to correct such errors.  Rex Kras, DO, [facility]. [place] Office: [phone] 08/25/2020, 6:28 PM

## 2020-08-26 ENCOUNTER — Inpatient Hospital Stay (HOSPITAL_COMMUNITY): Payer: Medicare HMO

## 2020-08-26 DIAGNOSIS — I5021 Acute systolic (congestive) heart failure: Secondary | ICD-10-CM | POA: Diagnosis not present

## 2020-08-26 LAB — COOXEMETRY PANEL
Carboxyhemoglobin: 1 % (ref 0.5–1.5)
Methemoglobin: 0.8 % (ref 0.0–1.5)
O2 Saturation: 65.1 %
Total hemoglobin: 8.8 g/dL — ABNORMAL LOW (ref 12.0–16.0)

## 2020-08-26 LAB — BASIC METABOLIC PANEL
Anion gap: 6 (ref 5–15)
BUN: 27 mg/dL — ABNORMAL HIGH (ref 8–23)
CO2: 29 mmol/L (ref 22–32)
Calcium: 8.1 mg/dL — ABNORMAL LOW (ref 8.9–10.3)
Chloride: 101 mmol/L (ref 98–111)
Creatinine, Ser: 1.38 mg/dL — ABNORMAL HIGH (ref 0.61–1.24)
GFR, Estimated: 55 mL/min — ABNORMAL LOW (ref >60)
Glucose, Bld: 73 mg/dL (ref 70–99)
Potassium: 3.9 mmol/L (ref 3.5–5.1)
Sodium: 136 mmol/L (ref 135–145)

## 2020-08-26 LAB — TYPE AND SCREEN
ABO/RH(D): AB POS
Antibody Screen: NEGATIVE
Unit division: 0
Unit division: 0

## 2020-08-26 LAB — BPAM RBC
Blood Product Expiration Date: 202208102359
Blood Product Expiration Date: 202208102359
ISSUE DATE / TIME: 202207191220
ISSUE DATE / TIME: 202207191220
Unit Type and Rh: 6200
Unit Type and Rh: 6200

## 2020-08-26 LAB — CBC
HCT: 27.8 % — ABNORMAL LOW (ref 39.0–52.0)
Hemoglobin: 9.3 g/dL — ABNORMAL LOW (ref 13.0–17.0)
MCH: 31.7 pg (ref 26.0–34.0)
MCHC: 33.5 g/dL (ref 30.0–36.0)
MCV: 94.9 fL (ref 80.0–100.0)
Platelets: 115 10*3/uL — ABNORMAL LOW (ref 150–400)
RBC: 2.93 MIL/uL — ABNORMAL LOW (ref 4.22–5.81)
RDW: 13.4 % (ref 11.5–15.5)
WBC: 8 10*3/uL (ref 4.0–10.5)
nRBC: 0 % (ref 0.0–0.2)

## 2020-08-26 LAB — GLUCOSE, CAPILLARY
Glucose-Capillary: 55 mg/dL — ABNORMAL LOW (ref 70–99)
Glucose-Capillary: 62 mg/dL — ABNORMAL LOW (ref 70–99)
Glucose-Capillary: 183 mg/dL — ABNORMAL HIGH (ref 70–99)
Glucose-Capillary: 178 mg/dL — ABNORMAL HIGH (ref 70–99)
Glucose-Capillary: 153 mg/dL — ABNORMAL HIGH (ref 70–99)
Glucose-Capillary: 179 mg/dL — ABNORMAL HIGH (ref 70–99)

## 2020-08-26 MED ORDER — POTASSIUM CHLORIDE CRYS ER 20 MEQ PO TBCR
40.0000 meq | EXTENDED_RELEASE_TABLET | Freq: Once | ORAL | Status: AC
  Administered 2020-08-26: 40 meq via ORAL
  Filled 2020-08-26: qty 2

## 2020-08-26 MED ORDER — PROSOURCE PLUS PO LIQD
30.0000 mL | Freq: Three times a day (TID) | ORAL | Status: DC
  Administered 2020-08-26 – 2020-08-31 (×13): 30 mL via ORAL
  Filled 2020-08-26 (×12): qty 30

## 2020-08-26 MED ORDER — GLUCOSE 40 % PO GEL
ORAL | Status: AC
  Administered 2020-08-26: 62 g
  Filled 2020-08-26: qty 2

## 2020-08-26 MED ORDER — FUROSEMIDE 10 MG/ML IJ SOLN
60.0000 mg | Freq: Two times a day (BID) | INTRAMUSCULAR | Status: DC
  Administered 2020-08-26 (×2): 60 mg via INTRAVENOUS
  Filled 2020-08-26 (×2): qty 6

## 2020-08-26 MED ORDER — SPIRONOLACTONE 12.5 MG HALF TABLET
12.5000 mg | ORAL_TABLET | Freq: Every day | ORAL | Status: DC
  Administered 2020-08-26 – 2020-08-31 (×6): 12.5 mg via ORAL
  Filled 2020-08-26 (×6): qty 1

## 2020-08-26 MED ORDER — DOBUTAMINE IN D5W 4-5 MG/ML-% IV SOLN: 2.0000 ug/kg/min | INTRAVENOUS | Status: DC

## 2020-08-26 MED ORDER — INSULIN ASPART 100 UNIT/ML IJ SOLN
0.0000 [IU] | Freq: Three times a day (TID) | INTRAMUSCULAR | Status: DC
  Administered 2020-08-26 – 2020-08-27 (×2): 3 [IU] via SUBCUTANEOUS
  Administered 2020-08-27: 5 [IU] via SUBCUTANEOUS
  Administered 2020-08-28: 2 [IU] via SUBCUTANEOUS

## 2020-08-26 NOTE — Progress Notes (Signed)
EKG CRITICAL VALUE     12 lead EKG performed.  Critical value noted.  Armstead Peaks, RN notified.   Alto Denver, CCT 08/26/2020 8:21 AM

## 2020-08-26 NOTE — Plan of Care (Signed)

## 2020-08-26 NOTE — Evaluation (Signed)
Occupational Therapy Evaluation Patient Details Name: Colin [name] MRN: [id number] DOB: [birth year] Today's Date: 08/26/2020    History of Present Illness 70y/o male admitted 7/16 with chest pain, NSTEMI, and new onset CHF. Pt presented with periodic chest pain, SOB, and weakness. 7/18: cath and temporary. 7/19: CABG x3. PMH includes HTN, HLD, acute HF, and DM   Clinical Impression   PTA patient independent. Admitted for above and presenting with problem list below, including impaired balance, decreased activity tolerance, sternal precautions and pain.  He completes ADLs with up to min assist, transfers with min assist using RW. Educated on precautions, safety; he requires up to max cueing to adhere to sternal precautions during session.  Believe he will benefit from interpreter (as available) to further educate on precautions. Recommend continued OT services acutely and after dc at [facility] level to optimize independence, safety and return to PLOF.  Will follow acutely.     Follow Up Recommendations  Home health OT;Supervision/Assistance - 24 hour    Equipment Recommendations  3 in 1 bedside commode;Other (comment) (RW)    Recommendations for Other Services       Precautions / Restrictions Precautions Precautions: Fall;Sternal Precaution Comments: reviewed with pt, but requires cueing to adhere functionally Restrictions Weight Bearing Restrictions: Yes Other Position/Activity Restrictions: sternal precautions      Mobility Bed Mobility               General bed mobility comments: OOB in recliner upon entry    Transfers Overall transfer level: Needs assistance Equipment used: Rolling walker (2 wheeled) Transfers: Sit to/from Stand Sit to Stand: Min assist         General transfer comment: min assist to power up and steady with cueing for hand placement and safety for sternal precautions    Balance Overall balance assessment: Mild deficits observed, not formally  tested                                         ADL either performed or assessed with clinical judgement   ADL Overall ADL's : Needs assistance/impaired     Grooming: Set up;Sitting           Upper Body Dressing : Minimal assistance;Sitting   Lower Body Dressing: Minimal assistance;Sit to/from stand Lower Body Dressing Details (indicate cue type and reason): able to don/doff socks, min assist sit to stand Toilet Transfer: Minimal assistance Toilet Transfer Details (indicate cue type and reason): using RW, simulated in room Toileting- Clothing Manipulation and Hygiene: Set up;Sitting/lateral lean Toileting - Clothing Manipulation Details (indicate cue type and reason): using urinal upon entry     Functional mobility during ADLs: Minimal assistance;Rolling walker;Cueing for safety General ADL Comments: pt limited by pain, sternal precautions and decreased activity tolerance     Vision   Vision Assessment?: No apparent visual deficits     Perception     Praxis      Pertinent Vitals/Pain Pain Assessment: Faces Faces Pain Scale: Hurts even more Pain Location: chest Pain Descriptors / Indicators: Discomfort;Grimacing;Guarding Pain Intervention(s): Limited activity within patient's tolerance;Monitored during session;Premedicated before session;Repositioned     Hand Dominance     Extremity/Trunk Assessment Upper Extremity Assessment Upper Extremity Assessment: Generalized weakness   Lower Extremity Assessment Lower Extremity Assessment: Defer to PT evaluation       Communication Communication Communication: Prefers language other than English (pt speaks english but primary  language is Publishing rights manager)   Cognition Arousal/Alertness: Awake/alert Behavior During Therapy: WFL for tasks assessed/performed Overall Cognitive Status: No family/caregiver present to determine baseline cognitive functioning                                  General Comments: pt alert and awake, following commands and engaging appropraitely.  Poor awareness and understanding to precautions requiring max cueing (would benefit from interpreter to increase understanding of precautions)   General Comments  VSS on RA    Exercises     Shoulder Instructions      Home Living Family/patient expects to be discharged to:: Private residence Living Arrangements: Spouse/significant other;Children   Type of Home: House Home Access: Stairs to enter     Home Layout: Two level;Able to live on main level with bedroom/bathroom     Bathroom Shower/Tub: Chief Strategy Officer: Standard     Home Equipment: Wheelchair - [employer] - 2 wheels;Bedside commode   Additional Comments: 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, unclear but reports family can assist as well      Prior Functioning/Environment Level of Independence: Independent                 OT Problem List: Decreased strength;Decreased activity tolerance;Impaired balance (sitting and/or standing);Decreased safety awareness;Decreased knowledge of use of DME or AE;Decreased knowledge of precautions;Cardiopulmonary status limiting activity;Pain      OT Treatment/Interventions: Self-care/ADL training;DME and/or AE instruction;Therapeutic activities;Patient/family education;Balance training;Therapeutic exercise    OT Goals(Current goals can be found in the care plan section) Acute Rehab OT Goals Patient Stated Goal: to return home. OT Goal Formulation: With patient Time For Goal Achievement: 09/09/20 Potential to Achieve Goals: Good  OT Frequency: Min 2X/week   Barriers to D/C:            Co-evaluation              AM-PAC OT "6 Clicks" Daily Activity     Outcome Measure Help from another person eating meals?: None Help from another person taking care of personal grooming?: A Little Help from another person toileting, which includes using toliet, bedpan, or  urinal?: A Little Help from another person bathing (including washing, rinsing, drying)?: A Little Help from another person to put on and taking off regular upper body clothing?: A Little Help from another person to put on and taking off regular lower body clothing?: A Little 6 Click Score: 19   End of Session Equipment Utilized During Treatment: Rolling walker Nurse Communication: Mobility status  Activity Tolerance: Patient tolerated treatment well Patient left: in chair;with call bell/phone within reach;with chair alarm set  OT Visit Diagnosis: Other abnormalities of gait and mobility (R26.89);Muscle weakness (generalized) (M62.81);Pain Pain - part of body:  (incisoinal- sternum)                Time: 0973-5329 OT Time Calculation (min): 25 min Charges:  OT General Charges $OT Visit: 1 Visit OT Evaluation $OT Eval Moderate Complexity: 1 Mod OT Treatments $Self Care/Home Management : 8-22 mins  Barry Brunner, OT Acute Rehabilitation Services Pager [phone] Office [phone]   Chancy Milroy 08/26/2020, 10:27 AM

## 2020-08-26 NOTE — Progress Notes (Addendum)
TCTS DAILY ICU PROGRESS NOTE                   [address]            [place] [address]          [phone]   3 Days Post-Op Procedure(s) (LRB): CORONARY ARTERY BYPASS GRAFTING (CABG) x 3 ON CARDIOPULMONARY BYPASS USING EVH AND PARTIALLY OPEN HARVESTED LEFT GREATER SAPHENOUS VEIN AND LEFT INTERNAL MAMMARY ARTERY.  LIMA TO LAD, SVG TO OM, SVG TO PDA (N/A) TRANSESOPHAGEAL ECHOCARDIOGRAM (TEE) (N/A) APPLICATION OF CELL SAVER (N/A) ENDOVEIN HARVEST AND PARTIALLY OPEN GREATER SAPHENOUS VEIN HARVESTING (Left)  Total Length of Stay:  LOS: 6 days   Subjective: C/O incisional chest pain  Objective: Vital signs in last 24 hours: Temp:  [97.8 F (36.6 C)-99 F (37.2 C)] 98.4 F (36.9 C) (07/22 0400) Pulse Rate:  [78-90] 90 (07/22 0700) Cardiac Rhythm: Normal sinus rhythm (07/22 0700) Resp:  [8-23] 20 (07/22 0700) BP: (97-124)/(51-91) 121/79 (07/22 0700) SpO2:  [89 %-99 %] 93 % (07/22 0700) Weight:  [64.3 kg] 64.3 kg (07/22 0500)  Filed Weights   08/24/20 0500 08/25/20 0500 08/26/20 0500  Weight: 61.7 kg 64.3 kg 64.3 kg    Weight change: 0 kg   Hemodynamic parameters for last 24 hours: CVP:  [6 mmHg-16 mmHg] 16 mmHg  Intake/Output from previous day: 07/21 0701 - 07/22 0700 In: 1275.4 [P.O.:510; I.V.:565.4; IV Piggyback:200.1] Out: 1150 [Urine:1150]  Intake/Output this shift: No intake/output data recorded.  Current Meds: Scheduled Meds:  acetaminophen  1,000 mg Oral Q6H   Or   acetaminophen (TYLENOL) oral liquid 160 mg/5 mL  1,000 mg Per Tube Q6H   acetaminophen (TYLENOL) oral liquid 160 mg/5 mL  650 mg Per Tube Once   Or   acetaminophen  650 mg Rectal Once   aspirin EC  325 mg Oral Daily   Or   aspirin  324 mg Per Tube Daily   bisacodyl  10 mg Oral Daily   Or   bisacodyl  10 mg Rectal Daily   chlorhexidine gluconate (MEDLINE KIT)  15 mL Mouth Rinse BID   Chlorhexidine Gluconate Cloth  6 each Topical Daily   Chlorhexidine Gluconate Cloth  6  each Topical Q0600   docusate sodium  200 mg Oral Daily   enoxaparin (LOVENOX) injection  40 mg Subcutaneous QHS   feeding supplement (GLUCERNA SHAKE)  237 mL Oral TID BM   gabapentin  100 mg Oral TID   insulin aspart  0-24 Units Subcutaneous Q4H   insulin detemir  10 Units Subcutaneous Daily   midodrine  5 mg Oral TID WC   multivitamin with minerals  1 tablet Oral Daily   pantoprazole  40 mg Oral Daily   rosuvastatin  40 mg Oral QHS   sodium chloride flush  10-40 mL Intracatheter Q12H   sodium chloride flush  3 mL Intravenous Q12H   Continuous Infusions:  sodium chloride Stopped (08/24/20 1126)   sodium chloride     sodium chloride     DOBUTamine 3 mcg/kg/min (08/26/20 0700)   insulin Stopped (08/24/20 1246)   lactated ringers     lactated ringers 20 mL/hr at 08/26/20 0700   lactated ringers     norepinephrine (LEVOPHED) Adult infusion Stopped (08/24/20 1127)   PRN Meds:.sodium chloride, dextrose, lactated ringers, morphine injection, ondansetron (ZOFRAN) IV, oxyCODONE, sodium chloride flush, sodium chloride flush, traMADol  General appearance: alert, cooperative, and no distress Heart: regular rate  and rhythm Lungs: dim left base Abdomen: soft, non tender Extremities: no edema Wound: incis healing well  Lab Results: CBC: Recent Labs    08/25/20 0431 08/26/20 0422  WBC 9.6 8.0  HGB 9.4* 9.3*  HCT 28.9* 27.8*  PLT 113* 115*   BMET:  Recent Labs    08/25/20 0431 08/26/20 0422  NA 138 136  K 4.8 3.9  CL 105 101  CO2 25 29  GLUCOSE 119* 73  BUN 28* 27*  CREATININE 1.51* 1.38*  CALCIUM 8.0* 8.1*    CMET: Lab Results  Component Value Date   WBC 8.0 08/26/2020   HGB 9.3 (L) 08/26/2020   HCT 27.8 (L) 08/26/2020   PLT 115 (L) 08/26/2020   GLUCOSE 73 08/26/2020   CHOL 187 08/21/2020   TRIG 53 08/21/2020   HDL 40 (L) 08/21/2020   LDLDIRECT 137.1 (H) 08/21/2020   LDLCALC 136 (H) 08/21/2020   ALT 17 08/23/2020   AST 17 08/23/2020   NA 136 08/26/2020   K  3.9 08/26/2020   CL 101 08/26/2020   CREATININE 1.38 (H) 08/26/2020   BUN 27 (H) 08/26/2020   CO2 29 08/26/2020   TSH 3.058 01/22/2010   INR 1.3 (H) 08/23/2020   HGBA1C 9.4 (H) 08/23/2020      PT/INR:  Recent Labs    08/23/20 1750  LABPROT 16.3*  INR 1.3*   Radiology: No results found.   Assessment/Plan: S/P Procedure(s) (LRB): CORONARY ARTERY BYPASS GRAFTING (CABG) x 3 ON CARDIOPULMONARY BYPASS USING EVH AND PARTIALLY OPEN HARVESTED LEFT GREATER SAPHENOUS VEIN AND LEFT INTERNAL MAMMARY ARTERY.  LIMA TO LAD, SVG TO OM, SVG TO PDA (N/A) TRANSESOPHAGEAL ECHOCARDIOGRAM (TEE) (N/A) APPLICATION OF CELL SAVER (N/A) ENDOVEIN HARVEST AND PARTIALLY OPEN GREATER SAPHENOUS VEIN HARVESTING (Left)   1 afeb, VSS, on low dose dobut, levo is off Co-Ox is 65- AHF team managing including diuretics. 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 Hemodynamics stable on dobut 3 this am and decreased to 2. Diuretic started since wt is 13 lbs over preop, renal function improving. CXR today shows less edema. He needs a lot of encouragement to use IS but did walk.

## 2020-08-26 NOTE — Progress Notes (Signed)
Patient ID: COURTEZ TWADDLE, male   DOB: [birth year], 70 y.o.   MRN: [id number] TCTS Evening Rounds: Hemodynamically stable on dobut.  Diruesed -1900 cc today.  Ambulated with PT today.

## 2020-08-26 NOTE — Progress Notes (Signed)
Nutrition Follow-up  DOCUMENTATION CODES:  Severe malnutrition in context of chronic illness  INTERVENTION:  Continue Glucerna shakes TID.  Continue MVI with minerals daily.  Add 30 ml ProSource Plus po TID, each supplement provides 100 kcal and 15 grams of protein.    NUTRITION DIAGNOSIS:  Severe Malnutrition related to chronic illness (CHF) as evidenced by severe fat depletion, severe muscle depletion.  GOAL:  Patient will meet greater than or equal to 90% of their needs  MONITOR:  PO intake, Supplement acceptance, Labs, Weight trends, I & O's, Skin  REASON FOR ASSESSMENT:  Consult Assessment of nutrition requirement/status  ASSESSMENT:  Colin [name] was admitted to the hospital with the working diagnosis of Non STEMI, complicated with acute systolic heart failure and AKI. 7/18 - heart cath 7/19 - CABG x3, TEE  7/20 - chest tube removed  Limited meal documentation in Epic.  Attempted to speak with pt via iPad. Unable to reach an interpreter.   Noticed pt ate a majority of his breakfast tray and working on morning Glucerna. Listened in on pt during rounds - pt stable and improving. Working with PT/OT.  Pt on clear liquids until this morning.  Admit wt: 65 kg Current wt: 64.3 kg  UOP: 1150 ml in 24 hrs  Continue Glucerna shakes TID and add ProSource Plus TID to promote protein intake.  Medications: reviewed; Dulcolax, colace, Glucerna shake TID, Lasix BID, SSI, Levemir, MVI with minerals, Protonix, spironolactone, LR infusions @ 10-20 ml/hr via IV, oxycodone PRN (given twice today)  Labs: reviewed; CBG 55-183 HbA1c: 9.4% (08/2020)  NUTRITION - FOCUSED PHYSICAL EXAM: Flowsheet Row Most Recent Value  Orbital Region Severe depletion  Upper Arm Region Moderate depletion  Thoracic and Lumbar Region Mild depletion  Buccal Region Severe depletion  Temple Region Severe depletion  Clavicle Bone Region Moderate depletion  Clavicle and Acromion Bone Region Severe depletion   Scapular Bone Region Severe depletion  Dorsal Hand Mild depletion  Patellar Region Moderate depletion  Anterior Thigh Region Moderate depletion  Posterior Calf Region Moderate depletion  Edema (RD Assessment) None  Hair Reviewed  Eyes Reviewed  Mouth Reviewed  Skin Reviewed  Nails Reviewed   Diet Order:   Diet Order             Diet heart healthy/carb modified Room service appropriate? Yes; Fluid consistency: Thin  Diet effective now                  EDUCATION NEEDS:  No education needs have been identified at this time  Skin:  Skin Assessment: Skin Integrity Issues: Skin Integrity Issues:: Incisions Incisions: L leg, closed and Chest, closed  Last BM:  08/19/20 - OBR in place  Height:  Ht Readings from Last 1 Encounters:  08/23/20 5\' 4"  (1.626 m)   Weight:  Wt Readings from Last 1 Encounters:  08/26/20 64.3 kg   Ideal Body Weight:  59.1 kg  BMI:  Body mass index is 24.33 kg/m.  Estimated Nutritional Needs:  Kcal:  1750-1950 Protein:  85-100 grams Fluid:  >1.75 L  08/28/20, RD, LDN (she/her/hers) Registered Dietitian I After-Hours/Weekend Pager # in [place]

## 2020-08-26 NOTE — Progress Notes (Signed)
Physical Therapy Treatment Patient Details Name: Colin [name] MRN: [id number] DOB: Aug 13, 1950 Today's Date: 08/26/2020    History of Present Illness 70y/o male admitted 7/16 with chest pain, NSTEMI, and new onset CHF. Pt presented with periodic chest pain, SOB, and weakness. 7/18: cath and temporary. 7/19: CABG x3. PMH includes HTN, HLD, acute HF, and DM    PT Comments    Pt's awareness of precautions remains unchanged from previous session. Multimodal cues provided during bed mobility and gait training. Pt demonstrated increased ambulation distance during session, although limited by pain and fatigue. Pt will be followed acutely with PT. Pre-BP- 128/58(79) Vitals throughout - 83-89 spo2 room air 93-100%  Post-vitals - BP 153/73  Follow Up Recommendations  Home health PT     Equipment Recommendations  None recommended by PT    Recommendations for Other Services       Precautions / Restrictions Precautions Precautions: Fall;Sternal Precaution Booklet Issued: No Precaution Comments: reviewed with pt, but requires cueing to adhere functionally Restrictions Weight Bearing Restrictions: Yes Other Position/Activity Restrictions: sternal precautions    Mobility  Bed Mobility Overal bed mobility: Needs Assistance             General bed mobility comments: OOB in recliner upon entry and end of session    Transfers Overall transfer level: Needs assistance Equipment used: Rolling walker (2 wheeled) Transfers: Sit to/from Stand Sit to Stand: Min guard         General transfer comment: guarding with mod cues for hand placement  Ambulation/Gait Ambulation/Gait assistance: Min assist Gait Distance (Feet): 300 Feet Assistive device: Rolling walker (2 wheeled) Gait Pattern/deviations: Step-through pattern;Decreased stride length;Trunk flexed   Gait velocity interpretation: 1.31 - 2.62 ft/sec, indicative of limited community ambulator General Gait Details: cues for  posture and proximity to RW, pt able to demonstrate increased distance with improved balance, chair follow but not needed next session   Stairs             Wheelchair Mobility    Modified Rankin (Stroke Patients Only)       Balance Overall balance assessment: Mild deficits observed, not formally tested                                          Cognition Arousal/Alertness: Awake/alert Behavior During Therapy: WFL for tasks assessed/performed Overall Cognitive Status: No family/caregiver present to determine baseline cognitive functioning Area of Impairment: Memory                     Memory: Decreased recall of precautions;Decreased short-term memory         General Comments: mod cues for precautions      Exercises General Exercises - Lower Extremity Long Arc Quad: AROM;Both;Seated;15 reps Hip Flexion/Marching: AROM;Both;Seated;15 reps    General Comments General comments (skin integrity, edema, etc.): VSS on RA      Pertinent Vitals/Pain Pain Assessment: Faces Pain Score: 7  Faces Pain Scale: Hurts even more Pain Location: sternal incision Pain Descriptors / Indicators: Discomfort;Grimacing;Guarding Pain Intervention(s): Limited activity within patient's tolerance;Monitored during session;Patient requesting pain meds-RN notified;Repositioned    Home Living Family/patient expects to be discharged to:: Private residence Living Arrangements: Spouse/significant other;Children   Type of Home: House Home Access: Stairs to enter   Home Layout: Two level;Able to live on main level with bedroom/bathroom Home Equipment: Wheelchair - [employer] - 2 wheels;Bedside  commode Additional Comments: 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 Progress towards PT goals: Progressing toward goals    Frequency    Min 3X/week      PT Plan Current plan remains appropriate    Co-evaluation              AM-PAC PT "6 Clicks" Mobility   Outcome Measure  Help needed turning from your back to your side while in a flat bed without using bedrails?: A Little Help needed moving from lying on your back to sitting on the side of a flat bed without using bedrails?: A Little Help needed moving to and from a bed to a chair (including a wheelchair)?: A Little Help needed standing up from a chair using your arms (e.g., wheelchair or bedside chair)?: A Little Help needed to walk in hospital room?: A Little Help needed climbing 3-5 steps with a railing? : A Lot 6 Click Score: 17    End of Session Equipment Utilized During Treatment: Gait belt Activity Tolerance: Patient tolerated treatment well Patient left: in bed;with call bell/phone within reach Nurse Communication: Mobility status;Precautions PT Visit Diagnosis: Other abnormalities of gait and mobility (R26.89);Muscle weakness (generalized) (M62.81)     Time: 1120-1150 PT Time Calculation (min) (ACUTE ONLY): 30 min  Charges:  $Gait Training: 8-22 mins $Therapeutic Exercise: 8-22 mins                     Velda Shell, SPT Acute Rehab: [phone]    Vance Gather 08/26/2020, 12:58 PM

## 2020-08-26 NOTE — Progress Notes (Signed)
Patient ID: Colin [name], male   DOB: 13-Jan-1951, 70 y.o.   MRN: [id number]     Advanced Heart Failure Rounding Note  PCP-Cardiologist: None   Subjective:    POD 2 after CABG x3 (LIMA-LAD, SVG-OM, SVG-PDA).  Dobutamine at 3, co-ox 65% with CVP 13.  Weight remains up.  He had 1 dose of IV Lasix yesterday.  Creatinine trending down, 1.38 today.   Complaining of sternal incision pain. Has walked.     Objective:   Weight Range: 64.3 kg Body mass index is 24.33 kg/m.   Vital Signs:   Temp:  [97.8 F (36.6 C)-99 F (37.2 C)] 98.4 F (36.9 C) (07/22 0400) Pulse Rate:  [78-90] 90 (07/22 0700) Resp:  [8-23] 20 (07/22 0700) BP: (97-124)/(51-91) 121/79 (07/22 0700) SpO2:  [89 %-99 %] 93 % (07/22 0700) Weight:  [64.3 kg] 64.3 kg (07/22 0500) Last BM Date: 08/19/20  Weight change: Filed Weights   08/24/20 0500 08/25/20 0500 08/26/20 0500  Weight: 61.7 kg 64.3 kg 64.3 kg    Intake/Output:   Intake/Output Summary (Last 24 hours) at 08/26/2020 0747 Last data filed at 08/26/2020 0700 Gross per 24 hour  Intake 1275.44 ml  Output 1150 ml  Net 125.44 ml      Physical Exam    General: NAD Neck: JVP 10 cm, no thyromegaly or thyroid nodule.  Lungs: Clear to auscultation bilaterally with normal respiratory effort. CV: Nondisplaced PMI.  Heart regular S1/S2, no S3/S4, no murmur.  No peripheral edema.   Abdomen: Soft, nontender, no hepatosplenomegaly, no distention.  Skin: Intact without lesions or rashes.  Neurologic: Alert and oriented x 3.  Psych: Normal affect. Extremities: No clubbing or cyanosis.  HEENT: Normal.    Telemetry    NSR 80s Personally reviewed    EKG    No new EKG to review   Labs    CBC Recent Labs    08/25/20 0431 08/26/20 0422  WBC 9.6 8.0  HGB 9.4* 9.3*  HCT 28.9* 27.8*  MCV 96.0 94.9  PLT 113* 875*   Basic Metabolic Panel Recent Labs    08/24/20 0413 08/24/20 1619 08/25/20 0431 08/26/20 0422  NA 137 138 138 136  K 4.8 5.4* 4.8 3.9   CL 109 107 105 101  CO2 _0 GLUCOSE 143* 127* 119* 73  BUN 23 27* 28* 27*  CREATININE 1.36* 1.60* 1.51* 1.38*  CALCIUM 8.1* 8.0* 8.0* 8.1*  MG 2.9* 2.7*  --   --    Liver Function Tests Recent Labs    08/23/20 0927  AST 17  ALT 17  ALKPHOS 61  BILITOT 0.5  PROT 5.6*  ALBUMIN 2.6*   No results for input(s): LIPASE, AMYLASE in the last 72 hours. Cardiac Enzymes No results for input(s): CKTOTAL, CKMB, CKMBINDEX, TROPONINI in the last 72 hours.  BNP: BNP (last 3 results) Recent Labs    08/20/20 0449 08/22/20 0312  BNP 1,079.7* 726.1*    ProBNP (last 3 results) No results for input(s): PROBNP in the last 8760 hours.   D-Dimer No results for input(s): DDIMER in the last 72 hours. Hemoglobin A1C No results for input(s): HGBA1C in the last 72 hours.  Fasting Lipid Panel No results for input(s): CHOL, HDL, LDLCALC, TRIG, CHOLHDL, LDLDIRECT in the last 72 hours.  Thyroid Function Tests No results for input(s): TSH, T4TOTAL, T3FREE, THYROIDAB in the last 72 hours.  Invalid input(s): FREET3  Other results:   Imaging    No results  found.   Medications:     Scheduled Medications:  acetaminophen  1,000 mg Oral Q6H   Or   acetaminophen (TYLENOL) oral liquid 160 mg/5 mL  1,000 mg Per Tube Q6H   acetaminophen (TYLENOL) oral liquid 160 mg/5 mL  650 mg Per Tube Once   Or   acetaminophen  650 mg Rectal Once   aspirin EC  325 mg Oral Daily   Or   aspirin  324 mg Per Tube Daily   bisacodyl  10 mg Oral Daily   Or   bisacodyl  10 mg Rectal Daily   chlorhexidine gluconate (MEDLINE KIT)  15 mL Mouth Rinse BID   Chlorhexidine Gluconate Cloth  6 each Topical Daily   Chlorhexidine Gluconate Cloth  6 each Topical Q0600   docusate sodium  200 mg Oral Daily   enoxaparin (LOVENOX) injection  40 mg Subcutaneous QHS   feeding supplement (GLUCERNA SHAKE)  237 mL Oral TID BM   furosemide  60 mg Intravenous BID   gabapentin  100 mg Oral TID   insulin aspart  0-24  Units Subcutaneous Q4H   insulin detemir  10 Units Subcutaneous Daily   midodrine  5 mg Oral TID WC   multivitamin with minerals  1 tablet Oral Daily   pantoprazole  40 mg Oral Daily   potassium chloride  40 mEq Oral Once   rosuvastatin  40 mg Oral QHS   sodium chloride flush  10-40 mL Intracatheter Q12H   sodium chloride flush  3 mL Intravenous Q12H   spironolactone  12.5 mg Oral Daily    Infusions:  sodium chloride Stopped (08/24/20 1126)   sodium chloride     sodium chloride     DOBUTamine     insulin Stopped (08/24/20 1246)   lactated ringers     lactated ringers 20 mL/hr at 08/26/20 0700   lactated ringers     norepinephrine (LEVOPHED) Adult infusion Stopped (08/24/20 1127)    PRN Medications: sodium chloride, dextrose, lactated ringers, morphine injection, ondansetron (ZOFRAN) IV, oxyCODONE, sodium chloride flush, sodium chloride flush, traMADol    Patient Profile   The patient is a 70 y/o Asian male with a medical history significant for hypertension, hyperlipidemia and diabetes who presented to [facility] for evaluation of chest pain.  He ruled in for an NSTEMI and underwent a cardiac catheterization that revealed multivessel CAD.  He will undergo a CABG on 08/23/2020.   Assessment/Plan   1. CAD:  Remote PCI to LCx and RCA.  NSTEMI this admission, found to have 3VD with occluded RCA with collaterals, 80% long proximal LAD stenosis, 90% stenosis proximal LCx and OM1 (relatively small caliber).  He is a diabetic with low EF.  Echo shows diffuse moderate hypokinesis, there is no thinning or akinesis, so suspect significant viability.  s/p CABG x3 on 08/23/2020  - Beta blocker held due to long pause  - Continue aspirin and statin  2. Acute systolic CHF: Echo with EF 30-35%, normal RV, moderate MR.  Ischemic cardiomyopathy. [place] on 08/22/2020 showed near-normal filling pressures with preserved cardiac output. Now s/p CABG. On DBA 3. Off NE. Co-ox 65%. Weight still up with CVP 13.   Creatinine trending down.  - Slow wean of dobutamine, can decrease to 2 today.  Would keep here until better-diuresed.  - Lasix 60 mg IV bid.   - Add spironolactone 12.5 daily.  3. Sinus pause: 5 second pause on 7/18. TTVP placed, but now with pacing wires postoperatively.  Rhythm appears  stable currently with no further pauses noted.  - ECG today.  - Careful beta blocker eventually if no heart block/pauses.  4. Type 2 diabetes: Insulin. 5. 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  Also with ulcerated plaque in infrarenal aorta. - Pre-CABG ultrasounds as noted above  6. Smoking: Urged him to quit   CRITICAL CARE Performed by: Loralie Champagne  Total critical care time: 35 minutes  Critical care time was exclusive of separately billable procedures and treating other patients.  Critical care was necessary to treat or prevent imminent or life-threatening deterioration.  Critical care was time spent personally by me on the following activities: development of treatment plan with patient and/or surrogate as well as nursing, discussions with consultants, evaluation of patient's response to treatment, examination of patient, obtaining history from patient or surrogate, ordering and performing treatments and interventions, ordering and review of laboratory studies, ordering and review of radiographic studies, pulse oximetry and re-evaluation of patient's condition.  Loralie Champagne 08/26/2020 7:47 AM

## 2020-08-26 NOTE — Progress Notes (Signed)
Inpatient Diabetes Program Recommendations  AACE/ADA: New Consensus Statement on Inpatient Glycemic Control (2015)  Target Ranges:  Prepandial:   less than 140 mg/dL      Peak postprandial:   less than 180 mg/dL (1-2 hours)      Critically ill patients:  140 - 180 mg/dL   Lab Results  Component Value Date   GLUCAP 178 (H) 08/26/2020   HGBA1C 9.4 (H) 08/23/2020    Review of Glycemic Control Results for Colin [name], Colin [name] (MRN [id number]) as of 08/26/2020 13:18  Ref. Range 08/25/2020 23:20 08/26/2020 03:37 08/26/2020 04:29 08/26/2020 07:32 08/26/2020 11:13  Glucose-Capillary Latest Ref Range: 70 - 99 mg/dL 403 (H) 55 (L) 62 (L) 474 (H) 178 (H)   Diabetes history: DM 2 Outpatient Diabetes medications:  Lantus 30 units q AM Current orders for Inpatient glycemic control:  Novolog 0-24 units q 4 hours Levemir 10 units daily Inpatient Diabetes Program Recommendations:   Please reduce frequency of Novolog correction to tid with meals and reduce to moderate scale.    Thanks,  Beryl Meager, RN, BC-ADM Inpatient Diabetes Coordinator Pager [phone]  (8a-5p)

## 2020-08-27 DIAGNOSIS — I5043 Acute on chronic combined systolic (congestive) and diastolic (congestive) heart failure: Secondary | ICD-10-CM | POA: Diagnosis not present

## 2020-08-27 LAB — COOXEMETRY PANEL
Carboxyhemoglobin: 1.1 % (ref 0.5–1.5)
Methemoglobin: 0.8 % (ref 0.0–1.5)
O2 Saturation: 69 %
Total hemoglobin: 11.2 g/dL — ABNORMAL LOW (ref 12.0–16.0)

## 2020-08-27 LAB — GLUCOSE, CAPILLARY
Glucose-Capillary: 161 mg/dL — ABNORMAL HIGH (ref 70–99)
Glucose-Capillary: 241 mg/dL — ABNORMAL HIGH (ref 70–99)
Glucose-Capillary: 107 mg/dL — ABNORMAL HIGH (ref 70–99)
Glucose-Capillary: 235 mg/dL — ABNORMAL HIGH (ref 70–99)

## 2020-08-27 LAB — BASIC METABOLIC PANEL
Anion gap: 6 (ref 5–15)
BUN: 29 mg/dL — ABNORMAL HIGH (ref 8–23)
CO2: 32 mmol/L (ref 22–32)
Calcium: 8.3 mg/dL — ABNORMAL LOW (ref 8.9–10.3)
Chloride: 99 mmol/L (ref 98–111)
Creatinine, Ser: 1.51 mg/dL — ABNORMAL HIGH (ref 0.61–1.24)
GFR, Estimated: 49 mL/min — ABNORMAL LOW (ref >60)
Glucose, Bld: 197 mg/dL — ABNORMAL HIGH (ref 70–99)
Potassium: 4.2 mmol/L (ref 3.5–5.1)
Sodium: 137 mmol/L (ref 135–145)

## 2020-08-27 LAB — CBC
HCT: 28.4 % — ABNORMAL LOW (ref 39.0–52.0)
Hemoglobin: 9.3 g/dL — ABNORMAL LOW (ref 13.0–17.0)
MCH: 31.1 pg (ref 26.0–34.0)
MCHC: 32.7 g/dL (ref 30.0–36.0)
MCV: 95 fL (ref 80.0–100.0)
Platelets: 180 10*3/uL (ref 150–400)
RBC: 2.99 MIL/uL — ABNORMAL LOW (ref 4.22–5.81)
RDW: 13.1 % (ref 11.5–15.5)
WBC: 7.3 10*3/uL (ref 4.0–10.5)
nRBC: 0 % (ref 0.0–0.2)

## 2020-08-27 MED ORDER — DOBUTAMINE IN D5W 4-5 MG/ML-% IV SOLN
1.0000 ug/kg/min | INTRAVENOUS | Status: DC
  Administered 2020-08-27: 1 ug/kg/min via INTRAVENOUS
  Filled 2020-08-27: qty 250

## 2020-08-27 MED ORDER — FUROSEMIDE 10 MG/ML IJ SOLN
60.0000 mg | Freq: Once | INTRAMUSCULAR | Status: AC
  Administered 2020-08-27: 60 mg via INTRAVENOUS
  Filled 2020-08-27: qty 6

## 2020-08-27 MED ORDER — LACTULOSE 10 GM/15ML PO SOLN
20.0000 g | Freq: Once | ORAL | Status: AC
  Administered 2020-08-27: 20 g via ORAL
  Filled 2020-08-27: qty 30

## 2020-08-27 MED ORDER — DAPAGLIFLOZIN PROPANEDIOL 10 MG PO TABS
10.0000 mg | ORAL_TABLET | Freq: Every day | ORAL | Status: DC
  Administered 2020-08-27 – 2020-08-31 (×5): 10 mg via ORAL
  Filled 2020-08-27 (×5): qty 1

## 2020-08-27 NOTE — Progress Notes (Signed)
4 Days Post-Op Procedure(s) (LRB): CORONARY ARTERY BYPASS GRAFTING (CABG) x 3 ON CARDIOPULMONARY BYPASS USING EVH AND PARTIALLY OPEN HARVESTED LEFT GREATER SAPHENOUS VEIN AND LEFT INTERNAL MAMMARY ARTERY.  LIMA TO LAD, SVG TO OM, SVG TO PDA (N/A) TRANSESOPHAGEAL ECHOCARDIOGRAM (TEE) (N/A) APPLICATION OF CELL SAVER (N/A) ENDOVEIN HARVEST AND PARTIALLY OPEN GREATER SAPHENOUS VEIN HARVESTING (Left) Subjective: No complaints.  Ambulating better every day.  Co-ox 69 on dobut 2 this am  Objective: Vital signs in last 24 hours: Temp:  [98.1 F (36.7 C)-98.8 F (37.1 C)] 98.8 F (37.1 C) (07/23 0300) Pulse Rate:  [82-96] 92 (07/23 0800) Cardiac Rhythm: Normal sinus rhythm (07/23 0800) Resp:  [9-25] 18 (07/23 0800) BP: (90-167)/(54-126) 125/63 (07/23 0800) SpO2:  [90 %-100 %] 91 % (07/23 0800) Weight:  [57.5 kg] 57.5 kg (07/23 0500)  Hemodynamic parameters for last 24 hours: CVP:  [0 mmHg-22 mmHg] 9 mmHg  Intake/Output from previous day: 07/22 0701 - 07/23 0700 In: 21.5 [I.V.:21.5] Out: 3625 [Urine:3625] Intake/Output this shift: Total I/O In: 0.1 [I.V.:0.1] Out: 300 [Urine:300]  General appearance: alert and cooperative Neurologic: intact Heart: regular rate and rhythm, S1, S2 normal, no murmur Lungs: clear to auscultation bilaterally Extremities: no edema Wound: incision ok  Lab Results: Recent Labs    08/26/20 0422 08/27/20 0500  WBC 8.0 7.3  HGB 9.3* 9.3*  HCT 27.8* 28.4*  PLT 115* 180   BMET:  Recent Labs    08/26/20 0422 08/27/20 0500  NA 136 137  K 3.9 4.2  CL 101 99  CO2 29 32  GLUCOSE 73 197*  BUN 27* 29*  CREATININE 1.38* 1.51*  CALCIUM 8.1* 8.3*    PT/INR: No results for input(s): LABPROT, INR in the last 72 hours. ABG    Component Value Date/Time   PHART 7.366 08/24/2020 0030   HCO3 20.4 08/24/2020 0030   TCO2 21 (L) 08/24/2020 0030   ACIDBASEDEF 4.0 (H) 08/24/2020 0030   O2SAT 69.0 08/27/2020 0458   CBG (last 3)  Recent Labs     08/26/20 1542 08/26/20 2227 08/27/20 0703  GLUCAP 153* 179* 161*    Assessment/Plan: S/P Procedure(s) (LRB): CORONARY ARTERY BYPASS GRAFTING (CABG) x 3 ON CARDIOPULMONARY BYPASS USING EVH AND PARTIALLY OPEN HARVESTED LEFT GREATER SAPHENOUS VEIN AND LEFT INTERNAL MAMMARY ARTERY.  LIMA TO LAD, SVG TO OM, SVG TO PDA (N/A) TRANSESOPHAGEAL ECHOCARDIOGRAM (TEE) (N/A) APPLICATION OF CELL SAVER (N/A) ENDOVEIN HARVEST AND PARTIALLY OPEN GREATER SAPHENOUS VEIN HARVESTING (Left)  POD 4  Hemodynamically stable on dobut 2. Decreased to 1 by Dr. Shirlee Latch this am. Heart failure regimen per DM.  Diuresed -3600 cc yesterday and CVP 8 this am, wt down 15 lbs if accurate. Creat up slightly. Dr. Shirlee Latch ordered one more dose of IV lasix this am.  Glucose under adequate control.  Continue IS, ambulation.    LOS: 7 days    Alleen Borne 08/27/2020

## 2020-08-27 NOTE — Progress Notes (Signed)
Patient ID: Colin [name], male   DOB: 09-10-1950, 70 y.o.   MRN: [id number] T[facility] Evening Rounds:  Hemodynamically stable on dobut 1.   Diuresed well today. -1900 so far.  Ambulated around the ICU well.  Should be able to stop dobut tomorrow.

## 2020-08-27 NOTE — Progress Notes (Signed)
Patient ID: Colin [name], male   DOB: [birth year], 70 y.o.   MRN: [id number]     Advanced Heart Failure Rounding Note  PCP-Cardiologist: None   Subjective:    POD 3 after CABG x3 (LIMA-LAD, SVG-OM, SVG-PDA).  Dobutamine at 2, co-ox 69% with CVP 8.  Good diuresis yesterday, weight down considerably, creatinine mildly higher 1.57.   Complaining of sternal incision pain. Has walked.     Objective:   Weight Range: 57.5 kg Body mass index is 21.76 kg/m.   Vital Signs:   Temp:  [98.1 F (36.7 C)-98.9 F (37.2 C)] 98.8 F (37.1 C) (07/23 0300) Pulse Rate:  [82-96] 89 (07/23 0700) Resp:  [9-25] 16 (07/23 0700) BP: (90-167)/(50-126) 117/65 (07/23 0700) SpO2:  [90 %-100 %] 94 % (07/23 0700) Weight:  [57.5 kg] 57.5 kg (07/23 0500) Last BM Date: 08/19/20  Weight change: Filed Weights   08/25/20 0500 08/26/20 0500 08/27/20 0500  Weight: 64.3 kg 64.3 kg 57.5 kg    Intake/Output:   Intake/Output Summary (Last 24 hours) at 08/27/2020 0749 Last data filed at 08/27/2020 0500 Gross per 24 hour  Intake 21.46 ml  Output 3625 ml  Net -3603.54 ml      Physical Exam    General: NAD Neck: JVP 8 cm, no thyromegaly or thyroid nodule.  Lungs: Clear to auscultation bilaterally with normal respiratory effort. CV: Nondisplaced PMI.  Heart regular S1/S2, no S3/S4, no murmur.  No peripheral edema.   Abdomen: Soft, nontender, no hepatosplenomegaly, no distention.  Skin: Intact without lesions or rashes.  Neurologic: Alert and oriented x 3.  Psych: Normal affect. Extremities: No clubbing or cyanosis.  HEENT: Normal.    Telemetry    NSR 80s Personally reviewed    EKG    No new EKG to review   Labs    CBC Recent Labs    08/26/20 0422 08/27/20 0500  WBC 8.0 7.3  HGB 9.3* 9.3*  HCT 27.8* 28.4*  MCV 94.9 95.0  PLT 115* 180   Basic Metabolic Panel Recent Labs    17/40/81 1619 08/25/20 0431 08/26/20 0422 08/27/20 0500  NA 138   < > 136 137  K 5.4*   < > 3.9 4.2  CL 107    < > 101 99  CO2 25   < > 29 32  GLUCOSE 127*   < > 73 197*  BUN 27*   < > 27* 29*  CREATININE 1.60*   < > 1.38* 1.51*  CALCIUM 8.0*   < > 8.1* 8.3*  MG 2.7*  --   --   --    < > = values in this interval not displayed.   Liver Function Tests No results for input(s): AST, ALT, ALKPHOS, BILITOT, PROT, ALBUMIN in the last 72 hours.  No results for input(s): LIPASE, AMYLASE in the last 72 hours. Cardiac Enzymes No results for input(s): CKTOTAL, CKMB, CKMBINDEX, TROPONINI in the last 72 hours.  BNP: BNP (last 3 results) Recent Labs    08/20/20 0449 08/22/20 0312  BNP 1,079.7* 726.1*    ProBNP (last 3 results) No results for input(s): PROBNP in the last 8760 hours.   D-Dimer No results for input(s): DDIMER in the last 72 hours. Hemoglobin A1C No results for input(s): HGBA1C in the last 72 hours.  Fasting Lipid Panel No results for input(s): CHOL, HDL, LDLCALC, TRIG, CHOLHDL, LDLDIRECT in the last 72 hours.  Thyroid Function Tests No results for input(s): TSH, T4TOTAL, T3FREE, THYROIDAB in the  last 72 hours.  Invalid input(s): FREET3  Other results:   Imaging    No results found.   Medications:     Scheduled Medications:  (feeding supplement) PROSource Plus  30 mL Oral TID BM   acetaminophen  1,000 mg Oral Q6H   Or   acetaminophen (TYLENOL) oral liquid 160 mg/5 mL  1,000 mg Per Tube Q6H   acetaminophen (TYLENOL) oral liquid 160 mg/5 mL  650 mg Per Tube Once   Or   acetaminophen  650 mg Rectal Once   aspirin EC  325 mg Oral Daily   Or   aspirin  324 mg Per Tube Daily   bisacodyl  10 mg Oral Daily   Or   bisacodyl  10 mg Rectal Daily   Chlorhexidine Gluconate Cloth  6 each Topical Daily   dapagliflozin propanediol  10 mg Oral Daily   docusate sodium  200 mg Oral Daily   enoxaparin (LOVENOX) injection  40 mg Subcutaneous QHS   feeding supplement (GLUCERNA SHAKE)  237 mL Oral TID BM   furosemide  60 mg Intravenous Once   gabapentin  100 mg Oral TID    insulin aspart  0-15 Units Subcutaneous TID WC   insulin detemir  10 Units Subcutaneous Daily   midodrine  5 mg Oral TID WC   multivitamin with minerals  1 tablet Oral Daily   pantoprazole  40 mg Oral Daily   rosuvastatin  40 mg Oral QHS   sodium chloride flush  10-40 mL Intracatheter Q12H   sodium chloride flush  3 mL Intravenous Q12H   spironolactone  12.5 mg Oral Daily    Infusions:  sodium chloride Stopped (08/24/20 1126)   sodium chloride     sodium chloride     DOBUTamine     insulin Stopped (08/24/20 1246)   lactated ringers 20 mL/hr at 08/26/20 0700   lactated ringers     norepinephrine (LEVOPHED) Adult infusion Stopped (08/24/20 1127)    PRN Medications: sodium chloride, dextrose, morphine injection, ondansetron (ZOFRAN) IV, oxyCODONE, sodium chloride flush, sodium chloride flush, traMADol    Patient Profile   The patient is a 70 y/o Asian male with a medical history significant for hypertension, hyperlipidemia and diabetes who presented to [facility] for evaluation of chest pain.  He ruled in for an NSTEMI and underwent a cardiac catheterization that revealed multivessel CAD.  He will undergo a CABG on 08/23/2020.   Assessment/Plan   1. CAD:  Remote PCI to LCx and RCA.  NSTEMI this admission, found to have 3VD with occluded RCA with collaterals, 80% long proximal LAD stenosis, 90% stenosis proximal LCx and OM1 (relatively small caliber).  He is a diabetic with low EF.  Echo shows diffuse moderate hypokinesis, there is no thinning or akinesis, so suspect significant viability.  s/p CABG x3 on 08/23/2020  - Beta blocker held due to long pause pre-op.   - Continue aspirin and statin  2. Acute systolic CHF: Echo with EF 30-35%, normal RV, moderate MR.  Ischemic cardiomyopathy. RHC on 08/22/2020 showed near-normal filling pressures with preserved cardiac output. Now s/p CABG. On DBA 2. Off NE. Co-ox 69%. Weight down significantly with good diuresis yesterday, CVP 8 with mild rise in  creatinine to 1.57.  - Slow wean of dobutamine, can decrease to 1 today.  Hopefully stop tomorrow.  - Lasix 60 mg IV x 1 today, switch to po tomorrow.  - Spironolactone 12.5 daily.  - Can add Farxiga 10 mg daily.  -  Remains on midodrine 5 tid, eventually titrate off.  3. Sinus pause: 5 second pause on 7/18. TTVP placed, but now with pacing wires postoperatively.  Rhythm appears stable currently with no further pauses noted.  - Careful beta blocker eventually if no heart block/pauses.  4. Type 2 diabetes: Insulin. 5. 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  Also with ulcerated plaque in infrarenal aorta. 6. Smoking: Urged him to quit   CRITICAL CARE Performed by: Marca Ancona  Total critical care time: 35 minutes  Critical care time was exclusive of separately billable procedures and treating other patients.  Critical care was necessary to treat or prevent imminent or life-threatening deterioration.  Critical care was time spent personally by me on the following activities: development of treatment plan with patient and/or surrogate as well as nursing, discussions with consultants, evaluation of patient's response to treatment, examination of patient, obtaining history from patient or surrogate, ordering and performing treatments and interventions, ordering and review of laboratory studies, ordering and review of radiographic studies, pulse oximetry and re-evaluation of patient's condition.  Marca Ancona 08/27/2020 7:49 AM

## 2020-08-28 DIAGNOSIS — I5043 Acute on chronic combined systolic (congestive) and diastolic (congestive) heart failure: Secondary | ICD-10-CM | POA: Diagnosis not present

## 2020-08-28 LAB — GLUCOSE, CAPILLARY
Glucose-Capillary: 139 mg/dL — ABNORMAL HIGH (ref 70–99)
Glucose-Capillary: 251 mg/dL — ABNORMAL HIGH (ref 70–99)
Glucose-Capillary: 112 mg/dL — ABNORMAL HIGH (ref 70–99)
Glucose-Capillary: 106 mg/dL — ABNORMAL HIGH (ref 70–99)

## 2020-08-28 LAB — COOXEMETRY PANEL
Carboxyhemoglobin: 1.4 % (ref 0.5–1.5)
Methemoglobin: 1.1 % (ref 0.0–1.5)
O2 Saturation: 68.9 %
Total hemoglobin: 8.4 g/dL — ABNORMAL LOW (ref 12.0–16.0)

## 2020-08-28 LAB — CBC
HCT: 30.6 % — ABNORMAL LOW (ref 39.0–52.0)
Hemoglobin: 10 g/dL — ABNORMAL LOW (ref 13.0–17.0)
MCH: 31.1 pg (ref 26.0–34.0)
MCHC: 32.7 g/dL (ref 30.0–36.0)
MCV: 95 fL (ref 80.0–100.0)
Platelets: 245 10*3/uL (ref 150–400)
RBC: 3.22 MIL/uL — ABNORMAL LOW (ref 4.22–5.81)
RDW: 12.8 % (ref 11.5–15.5)
WBC: 6.1 10*3/uL (ref 4.0–10.5)
nRBC: 0 % (ref 0.0–0.2)

## 2020-08-28 LAB — BASIC METABOLIC PANEL
Anion gap: 9 (ref 5–15)
BUN: 31 mg/dL — ABNORMAL HIGH (ref 8–23)
CO2: 30 mmol/L (ref 22–32)
Calcium: 8.3 mg/dL — ABNORMAL LOW (ref 8.9–10.3)
Chloride: 100 mmol/L (ref 98–111)
Creatinine, Ser: 1.36 mg/dL — ABNORMAL HIGH (ref 0.61–1.24)
GFR, Estimated: 56 mL/min — ABNORMAL LOW (ref >60)
Glucose, Bld: 126 mg/dL — ABNORMAL HIGH (ref 70–99)
Potassium: 4.1 mmol/L (ref 3.5–5.1)
Sodium: 139 mmol/L (ref 135–145)

## 2020-08-28 MED ORDER — ONDANSETRON HCL 4 MG PO TABS: 4.0000 mg | ORAL_TABLET | Freq: Four times a day (QID) | ORAL | Status: DC | PRN

## 2020-08-28 MED ORDER — SODIUM CHLORIDE 0.9% FLUSH: 3.0000 mL | INTRAVENOUS | Status: DC | PRN

## 2020-08-28 MED ORDER — OXYCODONE HCL 5 MG PO TABS
5.0000 mg | ORAL_TABLET | ORAL | Status: DC | PRN
  Administered 2020-08-28 – 2020-08-29 (×2): 10 mg via ORAL
  Administered 2020-08-29: 5 mg via ORAL
  Administered 2020-08-29: 10 mg via ORAL
  Filled 2020-08-28: qty 1
  Filled 2020-08-28 (×3): qty 2

## 2020-08-28 MED ORDER — FUROSEMIDE 40 MG PO TABS
40.0000 mg | ORAL_TABLET | Freq: Every day | ORAL | Status: DC
  Administered 2020-08-28 – 2020-08-31 (×4): 40 mg via ORAL
  Filled 2020-08-28 (×4): qty 1

## 2020-08-28 MED ORDER — INSULIN DETEMIR 100 UNIT/ML ~~LOC~~ SOLN
15.0000 [IU] | Freq: Every day | SUBCUTANEOUS | Status: DC
  Administered 2020-08-29 – 2020-08-30 (×2): 15 [IU] via SUBCUTANEOUS
  Filled 2020-08-28 (×2): qty 0.15

## 2020-08-28 MED ORDER — MIDODRINE HCL 5 MG PO TABS
2.5000 mg | ORAL_TABLET | Freq: Three times a day (TID) | ORAL | Status: DC
  Administered 2020-08-28 – 2020-08-31 (×9): 2.5 mg via ORAL
  Filled 2020-08-28 (×10): qty 1

## 2020-08-28 MED ORDER — TRAMADOL HCL 50 MG PO TABS
50.0000 mg | ORAL_TABLET | ORAL | Status: DC | PRN
  Administered 2020-08-29 – 2020-08-31 (×5): 50 mg via ORAL
  Filled 2020-08-28 (×5): qty 1

## 2020-08-28 MED ORDER — INSULIN ASPART 100 UNIT/ML IJ SOLN
0.0000 [IU] | Freq: Three times a day (TID) | INTRAMUSCULAR | Status: DC
  Administered 2020-08-28: 12 [IU] via SUBCUTANEOUS
  Administered 2020-08-29: 4 [IU] via SUBCUTANEOUS
  Administered 2020-08-29: 8 [IU] via SUBCUTANEOUS
  Administered 2020-08-29 – 2020-08-30 (×2): 12 [IU] via SUBCUTANEOUS
  Administered 2020-08-30: 16 [IU] via SUBCUTANEOUS
  Administered 2020-08-30: 8 [IU] via SUBCUTANEOUS
  Administered 2020-08-30: 4 [IU] via SUBCUTANEOUS
  Administered 2020-08-31: 2 [IU] via SUBCUTANEOUS
  Administered 2020-08-31: 8 [IU] via SUBCUTANEOUS

## 2020-08-28 MED ORDER — SENNOSIDES-DOCUSATE SODIUM 8.6-50 MG PO TABS
1.0000 | ORAL_TABLET | Freq: Two times a day (BID) | ORAL | Status: DC | PRN
  Administered 2020-08-28: 1 via ORAL
  Filled 2020-08-28: qty 1

## 2020-08-28 MED ORDER — SODIUM CHLORIDE 0.9% FLUSH
3.0000 mL | Freq: Two times a day (BID) | INTRAVENOUS | Status: DC
  Administered 2020-08-29 – 2020-08-31 (×4): 3 mL via INTRAVENOUS

## 2020-08-28 MED ORDER — PANTOPRAZOLE SODIUM 40 MG PO TBEC
40.0000 mg | DELAYED_RELEASE_TABLET | Freq: Every day | ORAL | Status: DC
  Administered 2020-08-29 – 2020-08-31 (×3): 40 mg via ORAL
  Filled 2020-08-28 (×4): qty 1

## 2020-08-28 MED ORDER — ONDANSETRON HCL 4 MG/2ML IJ SOLN: 4.0000 mg | Freq: Four times a day (QID) | INTRAMUSCULAR | Status: DC | PRN

## 2020-08-28 MED ORDER — SODIUM CHLORIDE 0.9 % IV SOLN: 250.0000 mL | INTRAVENOUS | Status: DC | PRN

## 2020-08-28 MED ORDER — ~~LOC~~ CARDIAC SURGERY, PATIENT & FAMILY EDUCATION: Freq: Once | Status: AC

## 2020-08-28 MED ORDER — ACETAMINOPHEN 325 MG PO TABS: 650.0000 mg | ORAL_TABLET | Freq: Four times a day (QID) | ORAL | Status: DC | PRN

## 2020-08-28 MED ORDER — ASPIRIN EC 325 MG PO TBEC
325.0000 mg | DELAYED_RELEASE_TABLET | Freq: Every day | ORAL | Status: DC
  Administered 2020-08-28 – 2020-08-31 (×4): 325 mg via ORAL
  Filled 2020-08-28 (×4): qty 1

## 2020-08-28 NOTE — Progress Notes (Signed)
2105: Spoke with RN Kathlene November to see if pt's CVC was still in. He said it had been taken out but it had not been documented.

## 2020-08-28 NOTE — Progress Notes (Signed)
Patient ID: Colin [name], male   DOB: June 04, 1950, 70 y.o.   MRN: [id number]     Advanced Heart Failure Rounding Note  PCP-Cardiologist: None   Subjective:    POD 3 after CABG x3 (LIMA-LAD, SVG-OM, SVG-PDA).  Dobutamine at 1, co-ox 69% with CVP 7.  Good diuresis yesterday, creatinine lower at 1.36.    Complaining of sternal incision pain. Has walked.     Objective:   Weight Range: 58.7 kg Body mass index is 22.21 kg/m.   Vital Signs:   Temp:  [98.5 F (36.9 C)-98.7 F (37.1 C)] 98.6 F (37 C) (07/24 0000) Pulse Rate:  [82-94] 89 (07/24 0700) Resp:  [10-27] 12 (07/24 0700) BP: (87-135)/(44-77) 128/75 (07/24 0700) SpO2:  [87 %-100 %] 87 % (07/24 0700) Weight:  [58.7 kg] 58.7 kg (07/24 0500) Last BM Date: 08/19/20  Weight change: Filed Weights   08/26/20 0500 08/27/20 0500 08/28/20 0500  Weight: 64.3 kg 57.5 kg 58.7 kg    Intake/Output:   Intake/Output Summary (Last 24 hours) at 08/28/2020 0752 Last data filed at 08/28/2020 0700 Gross per 24 hour  Intake 19.76 ml  Output 3000 ml  Net -2980.24 ml      Physical Exam    General: NAD Neck: No JVD, no thyromegaly or thyroid nodule.  Lungs: Clear to auscultation bilaterally with normal respiratory effort. CV: Nondisplaced PMI.  Heart regular S1/S2, no S3/S4, no murmur.  No peripheral edema.    Abdomen: Soft, nontender, no hepatosplenomegaly, no distention.  Skin: Intact without lesions or rashes.  Neurologic: Alert and oriented x 3.  Psych: Normal affect. Extremities: No clubbing or cyanosis.  HEENT: Normal.    Telemetry    NSR 80s Personally reviewed    EKG    No new EKG to review   Labs    CBC Recent Labs    08/27/20 0500 08/28/20 0415  WBC 7.3 6.1  HGB 9.3* 10.0*  HCT 28.4* 30.6*  MCV 95.0 95.0  PLT 180 245   Basic Metabolic Panel Recent Labs    40/98/11 0500 08/28/20 0415  NA 137 139  K 4.2 4.1  CL 99 100  CO2 32 30  GLUCOSE 197* 126*  BUN 29* 31*  CREATININE 1.51* 1.36*  CALCIUM  8.3* 8.3*   Liver Function Tests No results for input(s): AST, ALT, ALKPHOS, BILITOT, PROT, ALBUMIN in the last 72 hours.  No results for input(s): LIPASE, AMYLASE in the last 72 hours. Cardiac Enzymes No results for input(s): CKTOTAL, CKMB, CKMBINDEX, TROPONINI in the last 72 hours.  BNP: BNP (last 3 results) Recent Labs    08/20/20 0449 08/22/20 0312  BNP 1,079.7* 726.1*    ProBNP (last 3 results) No results for input(s): PROBNP in the last 8760 hours.   D-Dimer No results for input(s): DDIMER in the last 72 hours. Hemoglobin A1C No results for input(s): HGBA1C in the last 72 hours.  Fasting Lipid Panel No results for input(s): CHOL, HDL, LDLCALC, TRIG, CHOLHDL, LDLDIRECT in the last 72 hours.  Thyroid Function Tests No results for input(s): TSH, T4TOTAL, T3FREE, THYROIDAB in the last 72 hours.  Invalid input(s): FREET3  Other results:   Imaging    No results found.   Medications:     Scheduled Medications:  (feeding supplement) PROSource Plus  30 mL Oral TID BM   acetaminophen  1,000 mg Oral Q6H   Or   acetaminophen (TYLENOL) oral liquid 160 mg/5 mL  1,000 mg Per Tube Q6H   acetaminophen (TYLENOL)  oral liquid 160 mg/5 mL  650 mg Per Tube Once   Or   acetaminophen  650 mg Rectal Once   aspirin EC  325 mg Oral Daily   Or   aspirin  324 mg Per Tube Daily   bisacodyl  10 mg Oral Daily   Or   bisacodyl  10 mg Rectal Daily   Chlorhexidine Gluconate Cloth  6 each Topical Daily   dapagliflozin propanediol  10 mg Oral Daily   docusate sodium  200 mg Oral Daily   enoxaparin (LOVENOX) injection  40 mg Subcutaneous QHS   feeding supplement (GLUCERNA SHAKE)  237 mL Oral TID BM   furosemide  40 mg Oral Daily   gabapentin  100 mg Oral TID   insulin aspart  0-15 Units Subcutaneous TID WC   insulin detemir  10 Units Subcutaneous Daily   midodrine  2.5 mg Oral TID WC   multivitamin with minerals  1 tablet Oral Daily   pantoprazole  40 mg Oral Daily    rosuvastatin  40 mg Oral QHS   sodium chloride flush  10-40 mL Intracatheter Q12H   sodium chloride flush  3 mL Intravenous Q12H   spironolactone  12.5 mg Oral Daily    Infusions:  sodium chloride Stopped (08/24/20 1126)   sodium chloride     sodium chloride 10 mL/hr at 08/27/20 1223   lactated ringers 20 mL/hr at 08/26/20 0700   lactated ringers      PRN Medications: sodium chloride, dextrose, morphine injection, ondansetron (ZOFRAN) IV, oxyCODONE, sodium chloride flush, sodium chloride flush, traMADol    Patient Profile   The patient is a 70 y/o Asian male with a medical history significant for hypertension, hyperlipidemia and diabetes who presented to [facility] for evaluation of chest pain.  He ruled in for an NSTEMI and underwent a cardiac catheterization that revealed multivessel CAD.  He will undergo a CABG on 08/23/2020.   Assessment/Plan   1. CAD:  Remote PCI to LCx and RCA.  NSTEMI this admission, found to have 3VD with occluded RCA with collaterals, 80% long proximal LAD stenosis, 90% stenosis proximal LCx and OM1 (relatively small caliber).  He is a diabetic with low EF.  Echo shows diffuse moderate hypokinesis, there is no thinning or akinesis, so suspect significant viability.  s/p CABG x3 on 08/23/2020  - Beta blocker held due to long pause pre-op.   - Continue aspirin and statin  2. Acute systolic CHF: Echo with EF 30-35%, normal RV, moderate MR.  Ischemic cardiomyopathy. RHC on 08/22/2020 showed near-normal filling pressures with preserved cardiac output. Now s/p CABG. On DBA 1. Off NE. Co-ox 69%. Good diuresis yesterday, CVP 7 with creatinine lower at 1.36.  - Stop dobutamine.   - Lasix 40 mg po daily.  - Spironolactone 12.5 daily.  - Farxiga 10 mg daily.  - Decrease midodrine to 2.5 tid, stop tomorrow hopefully.  3. Sinus pause: 5 second pause on 7/18. TTVP placed, but now with pacing wires postoperatively.  Rhythm appears stable currently with no further pauses noted.  -  Careful beta blocker eventually if no heart block/pauses.  4. Type 2 diabetes: Insulin. 5. 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  Also with ulcerated plaque in infrarenal aorta. 6. Smoking: Urged him to quit   CRITICAL CARE Performed by: Marca Ancona  Total critical care time: 35 minutes  Critical care time was exclusive of separately billable procedures and treating other patients.  Critical care was necessary  to treat or prevent imminent or life-threatening deterioration.  Critical care was time spent personally by me on the following activities: development of treatment plan with patient and/or surrogate as well as nursing, discussions with consultants, evaluation of patient's response to treatment, examination of patient, obtaining history from patient or surrogate, ordering and performing treatments and interventions, ordering and review of laboratory studies, ordering and review of radiographic studies, pulse oximetry and re-evaluation of patient's condition.  Marca Ancona 08/28/2020 7:52 AM

## 2020-08-28 NOTE — Progress Notes (Signed)
5 Days Post-Op Procedure(s) (LRB): CORONARY ARTERY BYPASS GRAFTING (CABG) x 3 ON CARDIOPULMONARY BYPASS USING EVH AND PARTIALLY OPEN HARVESTED LEFT GREATER SAPHENOUS VEIN AND LEFT INTERNAL MAMMARY ARTERY.  LIMA TO LAD, SVG TO OM, SVG TO PDA (N/A) TRANSESOPHAGEAL ECHOCARDIOGRAM (TEE) (N/A) APPLICATION OF CELL SAVER (N/A) ENDOVEIN HARVEST AND PARTIALLY OPEN GREATER SAPHENOUS VEIN HARVESTING (Left) Subjective: No complaints  Objective: Vital signs in last 24 hours: Temp:  [98.5 F (36.9 C)-98.7 F (37.1 C)] 98.6 F (37 C) (07/24 0000) Pulse Rate:  [82-94] 88 (07/24 0800) Cardiac Rhythm: Normal sinus rhythm (07/24 0800) Resp:  [10-26] 16 (07/24 0800) BP: (87-135)/(44-77) 128/69 (07/24 0800) SpO2:  [87 %-100 %] 89 % (07/24 0800) Weight:  [58.7 kg] 58.7 kg (07/24 0500)  Hemodynamic parameters for last 24 hours: CVP:  [7 mmHg-11 mmHg] 7 mmHg  Intake/Output from previous day: 07/23 0701 - 07/24 0700 In: 19.8 [I.V.:19.8] Out: 3000 [Urine:3000] Intake/Output this shift: Total I/O In: 0.9 [I.V.:0.9] Out: -   General appearance: alert and cooperative Neurologic: intact Heart: regular rate and rhythm, S1, S2 normal, no murmur Lungs: clear to auscultation bilaterally Extremities: minimal edema Wound: incisions healing well  Lab Results: Recent Labs    08/27/20 0500 08/28/20 0415  WBC 7.3 6.1  HGB 9.3* 10.0*  HCT 28.4* 30.6*  PLT 180 245   BMET:  Recent Labs    08/27/20 0500 08/28/20 0415  NA 137 139  K 4.2 4.1  CL 99 100  CO2 32 30  GLUCOSE 197* 126*  BUN 29* 31*  CREATININE 1.51* 1.36*  CALCIUM 8.3* 8.3*    PT/INR: No results for input(s): LABPROT, INR in the last 72 hours. ABG    Component Value Date/Time   PHART 7.366 08/24/2020 0030   HCO3 20.4 08/24/2020 0030   TCO2 21 (L) 08/24/2020 0030   ACIDBASEDEF 4.0 (H) 08/24/2020 0030   O2SAT 68.9 08/28/2020 0415   CBG (last 3)  Recent Labs    08/27/20 1632 08/27/20 2149 08/28/20 0638  GLUCAP 107* 235*  139*    Assessment/Plan: S/P Procedure(s) (LRB): CORONARY ARTERY BYPASS GRAFTING (CABG) x 3 ON CARDIOPULMONARY BYPASS USING EVH AND PARTIALLY OPEN HARVESTED LEFT GREATER SAPHENOUS VEIN AND LEFT INTERNAL MAMMARY ARTERY.  LIMA TO LAD, SVG TO OM, SVG TO PDA (N/A) TRANSESOPHAGEAL ECHOCARDIOGRAM (TEE) (N/A) APPLICATION OF CELL SAVER (N/A) ENDOVEIN HARVEST AND PARTIALLY OPEN GREATER SAPHENOUS VEIN HARVESTING (Left)  POD 5  Hemodynamically stable on dobut 1 with Co-ox 69. Dobut stopped. Sinus rhythm.  Volume status about back to normal. Switched to oral lasix. -3L yesterday and wt about at baseline.  DM: glucose under adequate control on Levemir and SSI, Farxiga.  Renal function stable with decrease in creat to 1.36.   DC central line   Transfer to 4E and continue IS, ambulation.   LOS: 8 days    Alleen Borne 08/28/2020

## 2020-08-28 NOTE — Progress Notes (Signed)
Patient arrived to 4E from 2H. Telemetry box applied, CCMD notified. VSS. Patient stated 0/10 pain scale. Patient oriented to room and staff. Call bell in reach.

## 2020-08-29 DIAGNOSIS — E43 Unspecified severe protein-calorie malnutrition: Secondary | ICD-10-CM | POA: Insufficient documentation

## 2020-08-29 LAB — BASIC METABOLIC PANEL
Anion gap: 9 (ref 5–15)
BUN: 37 mg/dL — ABNORMAL HIGH (ref 8–23)
CO2: 30 mmol/L (ref 22–32)
Calcium: 8.8 mg/dL — ABNORMAL LOW (ref 8.9–10.3)
Chloride: 98 mmol/L (ref 98–111)
Creatinine, Ser: 1.34 mg/dL — ABNORMAL HIGH (ref 0.61–1.24)
GFR, Estimated: 57 mL/min — ABNORMAL LOW (ref >60)
Glucose, Bld: 199 mg/dL — ABNORMAL HIGH (ref 70–99)
Potassium: 4.4 mmol/L (ref 3.5–5.1)
Sodium: 137 mmol/L (ref 135–145)

## 2020-08-29 LAB — GLUCOSE, CAPILLARY
Glucose-Capillary: 153 mg/dL — ABNORMAL HIGH (ref 70–99)
Glucose-Capillary: 256 mg/dL — ABNORMAL HIGH (ref 70–99)
Glucose-Capillary: 185 mg/dL — ABNORMAL HIGH (ref 70–99)
Glucose-Capillary: 228 mg/dL — ABNORMAL HIGH (ref 70–99)

## 2020-08-29 LAB — CBC
HCT: 35.3 % — ABNORMAL LOW (ref 39.0–52.0)
Hemoglobin: 11.5 g/dL — ABNORMAL LOW (ref 13.0–17.0)
MCH: 30.3 pg (ref 26.0–34.0)
MCHC: 32.6 g/dL (ref 30.0–36.0)
MCV: 92.9 fL (ref 80.0–100.0)
Platelets: 353 10*3/uL (ref 150–400)
RBC: 3.8 MIL/uL — ABNORMAL LOW (ref 4.22–5.81)
RDW: 12.6 % (ref 11.5–15.5)
WBC: 6.4 10*3/uL (ref 4.0–10.5)
nRBC: 0 % (ref 0.0–0.2)

## 2020-08-29 MED ORDER — BISOPROLOL FUMARATE 5 MG PO TABS
2.5000 mg | ORAL_TABLET | Freq: Every day | ORAL | Status: DC
  Administered 2020-08-29 – 2020-08-31 (×3): 2.5 mg via ORAL
  Filled 2020-08-29 (×3): qty 1

## 2020-08-29 MED ORDER — METOPROLOL SUCCINATE ER 25 MG PO TB24: 12.5000 mg | ORAL_TABLET | Freq: Every day | ORAL | Status: DC

## 2020-08-29 MED ORDER — IVABRADINE HCL 5 MG PO TABS
5.0000 mg | ORAL_TABLET | Freq: Two times a day (BID) | ORAL | Status: DC
  Administered 2020-08-29 – 2020-08-31 (×4): 5 mg via ORAL
  Filled 2020-08-29 (×5): qty 1

## 2020-08-29 NOTE — Progress Notes (Signed)
Occupational Therapy Treatment Patient Details Name: Colin [name] MRN: [id number] DOB: 1950-05-22 Today's Date: 08/29/2020    History of present illness 70y/o male admitted 7/16 with chest pain, NSTEMI, and new onset CHF. Pt presented with periodic chest pain, SOB, and weakness. 7/18: cath and temporary. 7/19: CABG x3. PMH includes HTN, HLD, acute HF, and DM   OT comments  Pt progressing to OOB ADL and mobility. Pt at EOB performing LB dressing and education provided for sternal precautions. Pt able to comprehend in English with carryover skills 3/3 times when asked throughout session. No chest pain noted with short mobility; pain medicine provided by RN d/t pt had just ambulated with staff earlier x2 times. Pt would benefit from continued OT skilled services. OT following acutely.     Follow Up Recommendations  Home health OT;Supervision - Intermittent    Equipment Recommendations  3 in 1 bedside commode    Recommendations for Other Services      Precautions / Restrictions Precautions Precautions: Fall;Sternal Precaution Booklet Issued: Yes (comment) Precaution Comments: reviewed with pt, but requires cues to maintain precautions at times. Restrictions Weight Bearing Restrictions: Yes Other Position/Activity Restrictions: sternal precautions       Mobility Bed Mobility Overal bed mobility: Needs Assistance Bed Mobility: Supine to Sit     Supine to sit: Min assist     General bed mobility comments: MinA as pt did not want to roll instead holding onto pillow for long sitting to EOB.    Transfers Overall transfer level: Needs assistance Equipment used: Rolling walker (2 wheeled) Transfers: Sit to/from Stand Sit to Stand: Min guard         General transfer comment: cues for hand placement.    Balance Overall balance assessment: Mild deficits observed, not formally tested                                         ADL either performed or assessed  with clinical judgement   ADL Overall ADL's : Needs assistance/impaired     Grooming: Supervision/safety;Standing                               Functional mobility during ADLs: Min guard;Cueing for safety General ADL Comments: Pt participating with therapy at a higher level and able to comprehend and perform tasks while keeping precautions.     Vision   Vision Assessment?: No apparent visual deficits   Perception     Praxis      Cognition Arousal/Alertness: Awake/alert Behavior During Therapy: WFL for tasks assessed/performed Overall Cognitive Status: No family/caregiver present to determine baseline cognitive functioning Area of Impairment: Memory                     Memory: Decreased recall of precautions         General Comments: Pt education provided for sternal precautions. Pt able to comprehend in English and use of hand gestures with carryover skills 3/3 times when asked throughout session.        Exercises     Shoulder Instructions       General Comments VSS on RA    Pertinent Vitals/ Pain       Pain Assessment: Faces Faces Pain Scale: Hurts a little bit Pain Location: sternal incision and LLE Pain Descriptors / Indicators: Discomfort Pain Intervention(s):  Limited activity within patient's tolerance;Monitored during session  Home Living                                          Prior Functioning/Environment              Frequency  Min 2X/week        Progress Toward Goals  OT Goals(current goals can now be found in the care plan section)  Progress towards OT goals: Progressing toward goals  Acute Rehab OT Goals Patient Stated Goal: to return home. OT Goal Formulation: With patient Time For Goal Achievement: 09/09/20 Potential to Achieve Goals: Good  Plan Discharge plan remains appropriate    Co-evaluation                 AM-PAC OT "6 Clicks" Daily Activity     Outcome Measure    Help from another person eating meals?: None Help from another person taking care of personal grooming?: A Little Help from another person toileting, which includes using toliet, bedpan, or urinal?: A Little Help from another person bathing (including washing, rinsing, drying)?: A Little Help from another person to put on and taking off regular upper body clothing?: A Little Help from another person to put on and taking off regular lower body clothing?: A Little 6 Click Score: 19    End of Session Equipment Utilized During Treatment: Rolling walker  OT Visit Diagnosis: Other abnormalities of gait and mobility (R26.89);Muscle weakness (generalized) (M62.81);Pain Pain - part of body:  (sternum)   Activity Tolerance Patient tolerated treatment well   Patient Left in bed;with call bell/phone within reach;with bed alarm set   Nurse Communication Mobility status        Time: 1100-1115 OT Time Calculation (min): 15 min  Charges: OT General Charges $OT Visit: 1 Visit OT Treatments $Therapeutic Activity: 8-22 mins  Flora Lipps, OTR/L Acute Rehabilitation Services Pager: [phone] Office: [phone]    [place] 08/29/2020, 3:23 PM

## 2020-08-29 NOTE — Progress Notes (Signed)
CARDIAC REHAB PHASE I   Returned to offer to walk with pt. Pt requesting to walk later. Denies needs at this time. Will continue to follow.  Reynold Bowen, RN BSN 08/29/2020 2:53 PM

## 2020-08-29 NOTE — Progress Notes (Signed)
Mobility Specialist: Progress Note   08/29/20 1047  Mobility  Activity Ambulated in hall  Level of Assistance Contact guard assist, steadying assist  Assistive Device None  Distance Ambulated (ft) 470 ft  Mobility Ambulated with assistance in hallway  Mobility Response Tolerated well  Mobility performed by Mobility specialist  $Mobility charge 1 Mobility   Pre-Mobility: 91 HR, 126/75 BP, 94% SpO2 Post-Mobility: 98 HR, 131/76 BP, 97% SpO2  Pt required contact guard during ambulation using gait belt for balance. Pt c/oo 7/10 chest pain during ambulation, otherwise asx. Pt is sitting EOB after walk with call bell and phone within reach.   [facility] Eleftheria Taborn Mobility Specialist Mobility Specialist Phone: [phone]

## 2020-08-29 NOTE — Progress Notes (Signed)
Patient ID: Colin [name], male   DOB: [birth year], 70 y.o.   MRN: [id number]  Dr. Rosemary Holms plans to take over care for this patient, we will sign off.   Marca Ancona 08/29/2020 8:13 AM

## 2020-08-29 NOTE — Progress Notes (Signed)
CARDIAC REHAB PHASE I   Went to offer to walk with pt, pt up with mobility tech. HR 103. Will return to encourage continued ambulation.  Reynold Bowen, RN BSN 08/29/2020 10:39 AM

## 2020-08-29 NOTE — Progress Notes (Signed)
Physical Therapy Treatment Patient Details Name: Colin [name] MRN: [id number] DOB: Nov 14, 1950 Today's Date: 08/29/2020    History of Present Illness 70y/o male admitted 7/16 with chest pain, NSTEMI, and new onset CHF. Pt presented with periodic chest pain, SOB, and weakness. 7/18: cath and temporary. 7/19: CABG x3. PMH includes HTN, HLD, acute HF, and DM    PT Comments    Pt sitting EOb on arrival and able to progress gait to hall ambulation without RW and perform stairs with assist. Pt continues to need education and cues to maintain precautions with mobility.   HR 102-108  Pre gait 129/78 Post gait 119/71   Follow Up Recommendations  Home health PT     Equipment Recommendations  None recommended by PT    Recommendations for Other Services       Precautions / Restrictions Precautions Precautions: Fall;Sternal Precaution Booklet Issued: Yes (comment) Precaution Comments: reviewed with pt, but requires cueing to adhere functionally    Mobility  Bed Mobility               General bed mobility comments: pt sitting EOB on arrival    Transfers Overall transfer level: Needs assistance   Transfers: Sit to/from Stand Sit to Stand: Min guard         General transfer comment: guarding with mod cues for hand placement  Ambulation/Gait Ambulation/Gait assistance: Min guard Gait Distance (Feet): 400 Feet Assistive device: None Gait Pattern/deviations: Step-through pattern;Decreased stride length   Gait velocity interpretation: 1.31 - 2.62 ft/sec, indicative of limited community ambulator General Gait Details: pt walked with RW initial 25' then without remainder of gait with improved posture and stability   Stairs Stairs: Yes Stairs assistance: Min assist Stair Management: Step to pattern Number of Stairs: 2 General stair comments: HHA to maintain precautions with cues   Wheelchair Mobility    Modified Rankin (Stroke Patients Only)       Balance  Overall balance assessment: Mild deficits observed, not formally tested                                          Cognition Arousal/Alertness: Awake/alert Behavior During Therapy: WFL for tasks assessed/performed Overall Cognitive Status: No family/caregiver present to determine baseline cognitive functioning Area of Impairment: Memory                     Memory: Decreased recall of precautions         General Comments: mod cues for precautions      Exercises General Exercises - Lower Extremity Long Arc Quad: AROM;Both;Seated;20 reps Hip Flexion/Marching: AROM;Both;Seated;20 reps    General Comments        Pertinent Vitals/Pain Pain Score: 7  Pain Location: sternal incision and LLE Pain Descriptors / Indicators: Discomfort;Grimacing;Guarding Pain Intervention(s): Limited activity within patient's tolerance;Monitored during session;Repositioned    Home Living                      Prior Function            PT Goals (current goals can now be found in the care plan section) Progress towards PT goals: Progressing toward goals    Frequency    Min 3X/week      PT Plan Current plan remains appropriate    Co-evaluation  AM-PAC PT "6 Clicks" Mobility   Outcome Measure  Help needed turning from your back to your side while in a flat bed without using bedrails?: A Little Help needed moving from lying on your back to sitting on the side of a flat bed without using bedrails?: A Little Help needed moving to and from a bed to a chair (including a wheelchair)?: A Little Help needed standing up from a chair using your arms (e.g., wheelchair or bedside chair)?: A Little Help needed to walk in hospital room?: A Little Help needed climbing 3-5 steps with a railing? : A Little 6 Click Score: 18    End of Session Equipment Utilized During Treatment: Gait belt Activity Tolerance: Patient tolerated treatment well Patient  left: in chair;with call bell/phone within reach;with chair alarm set Nurse Communication: Mobility status;Precautions PT Visit Diagnosis: Other abnormalities of gait and mobility (R26.89);Muscle weakness (generalized) (M62.81)     Time: 5621-3086 PT Time Calculation (min) (ACUTE ONLY): 25 min  Charges:  $Gait Training: 8-22 mins $Therapeutic Exercise: 8-22 mins                     Suhey Radford P, PT Acute Rehabilitation Services Pager: [phone] Office: [phone]    Kylee Umana B Aldean Suddeth 08/29/2020, 7:53 AM

## 2020-08-29 NOTE — Progress Notes (Addendum)
      [address]       [place] [address]             [phone]      6 Days Post-Op Procedure(s) (LRB): CORONARY ARTERY BYPASS GRAFTING (CABG) x 3 ON CARDIOPULMONARY BYPASS USING EVH AND PARTIALLY OPEN HARVESTED LEFT GREATER SAPHENOUS VEIN AND LEFT INTERNAL MAMMARY ARTERY.  LIMA TO LAD, SVG TO OM, SVG TO PDA (N/A) TRANSESOPHAGEAL ECHOCARDIOGRAM (TEE) (N/A) APPLICATION OF CELL SAVER (N/A) ENDOVEIN HARVEST AND PARTIALLY OPEN GREATER SAPHENOUS VEIN HARVESTING (Left) Subjective: Having some incisional pain this morning, no other issues.   Objective: Vital signs in last 24 hours: Temp:  [98.2 F (36.8 C)-98.9 F (37.2 C)] 98.6 F (37 C) (07/25 0716) Pulse Rate:  [84-96] 95 (07/25 0716) Cardiac Rhythm: Normal sinus rhythm (07/24 1900) Resp:  [16-24] 17 (07/25 0716) BP: (106-175)/(63-129) 106/85 (07/25 0716) SpO2:  [88 %-100 %] 100 % (07/25 0716) FiO2 (%):  [21 %] 21 % (07/24 1024) Weight:  [54.9 kg] 54.9 kg (07/25 0641)  Hemodynamic parameters for last 24 hours: CVP:  [7 mmHg] 7 mmHg  Intake/Output from previous day: 07/24 0701 - 07/25 0700 In: 1 [I.V.:1] Out: 2025 [Urine:2025] Intake/Output this shift: No intake/output data recorded.  General appearance: alert, cooperative, and no distress Heart: sinus tachycardia, no murmur Lungs: clear to auscultation bilaterally Abdomen: soft, non-tender; bowel sounds normal; no masses,  no organomegaly Extremities: extremities normal, atraumatic, no cyanosis or edema Wound: clean and dry  Lab Results: Recent Labs    08/28/20 0415 08/29/20 0051  WBC 6.1 6.4  HGB 10.0* 11.5*  HCT 30.6* 35.3*  PLT 245 353   BMET:  Recent Labs    08/28/20 0415 08/29/20 0051  NA 139 137  K 4.1 4.4  CL 100 98  CO2 30 30  GLUCOSE 126* 199*  BUN 31* 37*  CREATININE 1.36* 1.34*  CALCIUM 8.3* 8.8*    PT/INR: No results for input(s): LABPROT, INR in the last 72 hours. ABG    Component Value Date/Time   PHART 7.366  08/24/2020 0030   HCO3 20.4 08/24/2020 0030   TCO2 21 (L) 08/24/2020 0030   ACIDBASEDEF 4.0 (H) 08/24/2020 0030   O2SAT 68.9 08/28/2020 0415   CBG (last 3)  Recent Labs    08/28/20 1654 08/28/20 2108 08/29/20 0633  GLUCAP 112* 106* 153*    Assessment/Plan: S/P Procedure(s) (LRB): CORONARY ARTERY BYPASS GRAFTING (CABG) x 3 ON CARDIOPULMONARY BYPASS USING EVH AND PARTIALLY OPEN HARVESTED LEFT GREATER SAPHENOUS VEIN AND LEFT INTERNAL MAMMARY ARTERY.  LIMA TO LAD, SVG TO OM, SVG TO PDA (N/A) TRANSESOPHAGEAL ECHOCARDIOGRAM (TEE) (N/A) APPLICATION OF CELL SAVER (N/A) ENDOVEIN HARVEST AND PARTIALLY OPEN GREATER SAPHENOUS VEIN HARVESTING (Left)  CV- NSR in the 90s, BP well controlled. Continue asa and statin. Continue midodrine for BP.  Pulm- Tolerating room air with acceptable oxygen saturation. Continue to encourage incentive spirometer and ambulation Renal-creatinine 1.34, stable, potassium 4.4. Continue lasix for fluid overload. H and H 11.5/35.3, expected acute blood loss anemia Endo- blood glucose well controlled, Farxiga added  Plan: Home health PT recommended. Will place orders. Otherwise doing well his first day on the step-down unit. Appreciate Cardiology's assistance.    LOS: 9 days    Sharlene Dory 08/29/2020  Agree with above Doing well Ambulating with PT Continue medical optimization Dispo planning  Kaleisha Bhargava O Presley Gora

## 2020-08-29 NOTE — Progress Notes (Signed)
Subjective:  Feels well. Eating breakfast this morning. Says he needs to get stronger. Has perioperative pain, understands this will take time to resolve  Objective:  Vital Signs in the last 24 hours: Temp:  [98.2 F (36.8 C)-98.9 F (37.2 C)] 98.6 F (37 C) (07/25 0716) Pulse Rate:  [84-109] 109 (07/25 0740) Resp:  [16-24] 17 (07/25 0716) BP: (106-175)/(63-129) 129/78 (07/25 0740) SpO2:  [88 %-100 %] 93 % (07/25 0740) FiO2 (%):  [21 %] 21 % (07/24 1024) Weight:  [54.9 kg] 54.9 kg (07/25 0641)  Intake/Output from previous day: 07/24 0701 - 07/25 0700 In: 1 [I.V.:1] Out: 2025 [Urine:2025]  Physical Exam Vitals and nursing note reviewed.  Constitutional:      General: He is not in acute distress.    Appearance: He is well-developed.  HENT:     Head: Normocephalic and atraumatic.  Eyes:     Conjunctiva/sclera: Conjunctivae normal.     Pupils: Pupils are equal, round, and reactive to light.  Neck:     Vascular: No JVD.  Cardiovascular:     Rate and Rhythm: Regular rhythm. Tachycardia present.     Pulses:          Dorsalis pedis pulses are 1+ on the right side and 1+ on the left side.       Posterior tibial pulses are 0 on the right side and 0 on the left side.     Heart sounds: No murmur heard. Pulmonary:     Effort: Pulmonary effort is normal.     Breath sounds: Normal breath sounds. No wheezing or rales.  Chest:     Comments: Sternotomy scar, well healing  Abdominal:     General: Bowel sounds are normal.     Palpations: Abdomen is soft.     Tenderness: There is no rebound.  Musculoskeletal:        General: No tenderness. Normal range of motion.     Right lower leg: No edema.     Left lower leg: No edema.  Lymphadenopathy:     Cervical: No cervical adenopathy.  Skin:    General: Skin is warm and dry.  Neurological:     Mental Status: He is alert and oriented to person, place, and time.     Cranial Nerves: No cranial nerve deficit.     Lab  Results: BMP Recent Labs    09/30/19 1627 12/17/19 1101 05/06/20 1351 08/27/20 0500 08/28/20 0415 08/29/20 0051  NA 133* 142   < > 137 139 137  K 4.0 4.7   < > 4.2 4.1 4.4  CL 96* 104   < > 99 100 98  CO2 25 27   < > 32 30 30  GLUCOSE 484* 112*   < > 197* 126* 199*  BUN 34* 23   < > 29* 31* 37*  CREATININE 1.44* 1.15   < > 1.51* 1.36* 1.34*  CALCIUM 9.5 9.5   < > 8.3* 8.3* 8.8*  GFRNONAA 49* 65   < > 49* 56* 57*  GFRAA 57* 75  --   --   --   --    < > = values in this interval not displayed.    CBC Recent Labs  Lab 08/29/20 0051  WBC 6.4  RBC 3.80*  HGB 11.5*  HCT 35.3*  PLT 353  MCV 92.9  MCH 30.3  MCHC 32.6  RDW 12.6    HEMOGLOBIN A1C Lab Results  Component Value Date   HGBA1C 9.4 (H)  08/23/2020   MPG 223.08 08/23/2020    Cardiac Panel (last 3 results) No results for input(s): CKTOTAL, CKMB, TROPONINI, RELINDX in the last 8760 hours.  BNP (last 3 results) Recent Labs    08/20/20 0449 08/22/20 0312  BNP 1,079.7* 726.1*    Lipid Panel     Component Value Date/Time   CHOL 187 08/21/2020 0039   CHOL 171 12/17/2019 1101   TRIG 53 08/21/2020 0039   HDL 40 (L) 08/21/2020 0039   HDL 41 12/17/2019 1101   CHOLHDL 4.7 08/21/2020 0039   VLDL 11 08/21/2020 0039   LDLCALC 136 (H) 08/21/2020 0039   LDLCALC 114 (H) 12/17/2019 1101   LDLDIRECT 137.1 (H) 08/21/2020 0039     Hepatic Function Panel Recent Labs    12/17/19 1101 08/23/20 0927  PROT 6.8 5.6*  ALBUMIN 4.0 2.6*  AST 12 17  ALT 23 17  ALKPHOS 82 61  BILITOT <0.2 0.5    Cardiac Studies:  EKG: Pending today  Op note 08/23/2020 (Dr. Cliffton Asters): CABG X 3.  LIMA LAD, RSVG PDA, OM1    Coronary angiography 08/22/2020: LM: Minimal disease LAD: Prox-mid long 80% stenosis Lcx: Lcx/OM1 95% ISR RCA: Prox 80% ISR, followed by In-stent CTO mid-distal vessel Left-to-right collaterals to distal vessel Mildly decompensated ischemic cardiomyopathy Temp pacer placed through RIJ   Surgical  consult for CABG  Echocardiogram 08/20/2020:  1. Left ventricular ejection fraction, by estimation, is 30 to 35%. The  left ventricle has moderate to severely decreased function. The left  ventricle demonstrates global hypokinesis. The left ventricular internal  cavity size was mildly dilated. Left  ventricular diastolic parameters are consistent with Grade II diastolic  dysfunction (pseudonormalization). Elevated left atrial pressure.   2. Right ventricular systolic function is mildly reduced. The right  ventricular size is normal.   3. Left atrial size was mildly dilated.   4. The mitral valve is degenerative. Moderate mitral valve regurgitation.  No evidence of mitral stenosis.   5. The aortic valve is tricuspid. Aortic valve regurgitation is not  visualized. Mild aortic valve sclerosis is present, with no evidence of  aortic valve stenosis.   6. The inferior vena cava is normal in size with greater than 50%  respiratory variability, suggesting right atrial pressure of 3 mmHg.   7. Evidence of atrial level shunting detected by color flow Doppler.    Assessment & Recommendations:  70 year old [name] speaking patient with uncontrolled type 2 diabetes mellitus, mixed hyperlipidemia, tobacco dependence, coronary artery disease with prior MI, LCX and RCA stents in 2012, now with CABG X 3.  LIMA LAD, RSVG PDA, OM1  (08/23/2020), HFrEF  CAD:  S/p CABG X 3.  LIMA LAD, RSVG PDA, OM1  (08/23/2020) Aspirin 325 mg for one month Given ACS presentation prior to CABG, recommend DAPT with Aspirin and plavix after 1 month Continue rosuvastatin 40 mg. No on BB due to 5 sec symptomatic sinus pause pre CABG, most likely in the setting of ischemia. He has not had any pauses since then.  Start cautious use of bisoprolol 2.5 mg today  HFrEF: EF 30-35%. Ischemic cardiomyopathy. Euvolumic.  14 L net negative Started bisoprolol 2.5 mg daily and corlanor 5 mg bid. Continue spironolactone 12.5 mg  daily, Farxiga 10 mg daily. 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

## 2020-08-30 LAB — GLUCOSE, CAPILLARY
Glucose-Capillary: 238 mg/dL — ABNORMAL HIGH (ref 70–99)
Glucose-Capillary: 301 mg/dL — ABNORMAL HIGH (ref 70–99)
Glucose-Capillary: 183 mg/dL — ABNORMAL HIGH (ref 70–99)
Glucose-Capillary: 258 mg/dL — ABNORMAL HIGH (ref 70–99)

## 2020-08-30 MED ORDER — INSULIN DETEMIR 100 UNIT/ML ~~LOC~~ SOLN
18.0000 [IU] | Freq: Every day | SUBCUTANEOUS | Status: DC
  Administered 2020-08-31: 18 [IU] via SUBCUTANEOUS
  Filled 2020-08-30: qty 0.18

## 2020-08-30 NOTE — Progress Notes (Signed)
CARDIAC REHAB PHASE I   Went to offer to walk with pt. Pt declines at this time, states he will walk later. Will continue to follow.  [id number] Reynold Bowen, RN BSN 08/30/2020 2:56 PM

## 2020-08-30 NOTE — Progress Notes (Addendum)
      [address]       [place] [address]             [phone]      7 Days Post-Op Procedure(s) (LRB): CORONARY ARTERY BYPASS GRAFTING (CABG) x 3 ON CARDIOPULMONARY BYPASS USING EVH AND PARTIALLY OPEN HARVESTED LEFT GREATER SAPHENOUS VEIN AND LEFT INTERNAL MAMMARY ARTERY.  LIMA TO LAD, SVG TO OM, SVG TO PDA (N/A) TRANSESOPHAGEAL ECHOCARDIOGRAM (TEE) (N/A) APPLICATION OF CELL SAVER (N/A) ENDOVEIN HARVEST AND PARTIALLY OPEN GREATER SAPHENOUS VEIN HARVESTING (Left) Subjective: Feels okay over all . He is still having some sternal pain around his incision. He also states that he is tired.   Objective: Vital signs in last 24 hours: Temp:  [97.6 F (36.4 C)-98.3 F (36.8 C)] 97.6 F (36.4 C) (07/26 0726) Pulse Rate:  [68-109] 77 (07/26 0726) Cardiac Rhythm: Normal sinus rhythm (07/25 1928) Resp:  [16-20] 19 (07/26 0726) BP: (107-129)/(60-78) 107/66 (07/26 0726) SpO2:  [93 %-98 %] 98 % (07/26 0726) Weight:  [54.3 kg] 54.3 kg (07/26 0458)     Intake/Output from previous day: 07/25 0701 - 07/26 0700 In: 953 [P.O.:953] Out: 1700 [Urine:1700] Intake/Output this shift: No intake/output data recorded.  General appearance: alert, cooperative, and no distress Heart: regular rate and rhythm, S1, S2 normal, no murmur, click, rub or gallop Lungs: clear to auscultation bilaterally Abdomen: soft, non-tender; bowel sounds normal; no masses,  no organomegaly Extremities: extremities normal, atraumatic, no cyanosis or edema Wound: clean and dry  Lab Results: Recent Labs    08/28/20 0415 08/29/20 0051  WBC 6.1 6.4  HGB 10.0* 11.5*  HCT 30.6* 35.3*  PLT 245 353   BMET:  Recent Labs    08/28/20 0415 08/29/20 0051  NA 139 137  K 4.1 4.4  CL 100 98  CO2 30 30  GLUCOSE 126* 199*  BUN 31* 37*  CREATININE 1.36* 1.34*  CALCIUM 8.3* 8.8*    PT/INR: No results for input(s): LABPROT, INR in the last 72 hours. ABG    Component Value Date/Time   PHART 7.366  08/24/2020 0030   HCO3 20.4 08/24/2020 0030   TCO2 21 (L) 08/24/2020 0030   ACIDBASEDEF 4.0 (H) 08/24/2020 0030   O2SAT 68.9 08/28/2020 0415   CBG (last 3)  Recent Labs    08/29/20 1610 08/29/20 2113 08/30/20 0630  GLUCAP 185* 228* 238*    Assessment/Plan: S/P Procedure(s) (LRB): CORONARY ARTERY BYPASS GRAFTING (CABG) x 3 ON CARDIOPULMONARY BYPASS USING EVH AND PARTIALLY OPEN HARVESTED LEFT GREATER SAPHENOUS VEIN AND LEFT INTERNAL MAMMARY ARTERY.  LIMA TO LAD, SVG TO OM, SVG TO PDA (N/A) TRANSESOPHAGEAL ECHOCARDIOGRAM (TEE) (N/A) APPLICATION OF CELL SAVER (N/A) ENDOVEIN HARVEST AND PARTIALLY OPEN GREATER SAPHENOUS VEIN HARVESTING (Left)  CV- NSR in the 90s, BP well controlled. Continue asa and statin. Continue midodrine for BP. Ischemic cardiomyopathy-appreciate cardiology's assistance.  Pulm- Tolerating room air with acceptable oxygen saturation. Continue to encourage incentive spirometer and ambulation Renal-creatinine 1.34, stable, potassium 4.4. Continue lasix for fluid overload.  H and H 11.5/35.3, expected acute blood loss anemia Endo- blood glucose well controlled, Farxiga added   Plan: Home health PT recommended. Orders placed yesterday. We will see how he does today with ambulation. I think he is getting close to being ready for discharge.    LOS: 10 days    Sharlene Dory 08/30/2020   Agree with above Doing well Dispo planning  Dossie Swor O Blenda Wisecup

## 2020-08-30 NOTE — Progress Notes (Signed)
Subjective:  Feels well.  Perioperative pain has improved  Objective:  Vital Signs in the last 24 hours: Temp:  [97.6 F (36.4 C)-98.3 F (36.8 C)] 97.6 F (36.4 C) (07/26 0726) Pulse Rate:  [68-91] 77 (07/26 0726) Resp:  [16-20] 19 (07/26 0726) BP: (107-119)/(60-77) 107/66 (07/26 0726) SpO2:  [94 %-98 %] 98 % (07/26 0726) Weight:  [54.3 kg] 54.3 kg (07/26 0458)  Intake/Output from previous day: 07/25 0701 - 07/26 0700 In: 953 [P.O.:953] Out: 1700 [Urine:1700]  Physical Exam Vitals and nursing note reviewed.  Constitutional:      General: He is not in acute distress.    Appearance: He is well-developed.  HENT:     Head: Normocephalic and atraumatic.  Eyes:     Conjunctiva/sclera: Conjunctivae normal.     Pupils: Pupils are equal, round, and reactive to light.  Neck:     Vascular: No JVD.  Cardiovascular:     Rate and Rhythm: Normal rate and regular rhythm.     Pulses:          Dorsalis pedis pulses are 1+ on the right side and 1+ on the left side.       Posterior tibial pulses are 0 on the right side and 0 on the left side.     Heart sounds: No murmur heard. Pulmonary:     Effort: Pulmonary effort is normal.     Breath sounds: Normal breath sounds. No wheezing or rales.  Chest:     Comments: Sternotomy scar, well healing  Abdominal:     General: Bowel sounds are normal.     Palpations: Abdomen is soft.     Tenderness: There is no rebound.  Musculoskeletal:        General: No tenderness. Normal range of motion.     Right lower leg: No edema.     Left lower leg: No edema.  Lymphadenopathy:     Cervical: No cervical adenopathy.  Skin:    General: Skin is warm and dry.  Neurological:     Mental Status: He is alert and oriented to person, place, and time.     Cranial Nerves: No cranial nerve deficit.     Lab Results: BMP Recent Labs    09/30/19 1627 12/17/19 1101 05/06/20 1351 08/27/20 0500 08/28/20 0415 08/29/20 0051  NA 133* 142   < > 137 139 137   K 4.0 4.7   < > 4.2 4.1 4.4  CL 96* 104   < > 99 100 98  CO2 25 27   < > 32 30 30  GLUCOSE 484* 112*   < > 197* 126* 199*  BUN 34* 23   < > 29* 31* 37*  CREATININE 1.44* 1.15   < > 1.51* 1.36* 1.34*  CALCIUM 9.5 9.5   < > 8.3* 8.3* 8.8*  GFRNONAA 49* 65   < > 49* 56* 57*  GFRAA 57* 75  --   --   --   --    < > = values in this interval not displayed.     CBC Recent Labs  Lab 08/29/20 0051  WBC 6.4  RBC 3.80*  HGB 11.5*  HCT 35.3*  PLT 353  MCV 92.9  MCH 30.3  MCHC 32.6  RDW 12.6     HEMOGLOBIN A1C Lab Results  Component Value Date   HGBA1C 9.4 (H) 08/23/2020   MPG 223.08 08/23/2020    Cardiac Panel (last 3 results) No results for input(s): CKTOTAL, CKMB, TROPONINI,  RELINDX in the last 8760 hours.  BNP (last 3 results) Recent Labs    08/20/20 0449 08/22/20 0312  BNP 1,079.7* 726.1*     Lipid Panel     Component Value Date/Time   CHOL 187 08/21/2020 0039   CHOL 171 12/17/2019 1101   TRIG 53 08/21/2020 0039   HDL 40 (L) 08/21/2020 0039   HDL 41 12/17/2019 1101   CHOLHDL 4.7 08/21/2020 0039   VLDL 11 08/21/2020 0039   LDLCALC 136 (H) 08/21/2020 0039   LDLCALC 114 (H) 12/17/2019 1101   LDLDIRECT 137.1 (H) 08/21/2020 0039     Hepatic Function Panel Recent Labs    12/17/19 1101 08/23/20 0927  PROT 6.8 5.6*  ALBUMIN 4.0 2.6*  AST 12 17  ALT 23 17  ALKPHOS 82 61  BILITOT <0.2 0.5     Cardiac Studies:  EKG: Pending today  Op note 08/23/2020 (Dr. Cliffton Asters): CABG X 3.  LIMA LAD, RSVG PDA, OM1    Coronary angiography 08/22/2020: LM: Minimal disease LAD: Prox-mid long 80% stenosis Lcx: Lcx/OM1 95% ISR RCA: Prox 80% ISR, followed by In-stent CTO mid-distal vessel Left-to-right collaterals to distal vessel Mildly decompensated ischemic cardiomyopathy Temp pacer placed through RIJ   Surgical consult for CABG  Echocardiogram 08/20/2020:  1. Left ventricular ejection fraction, by estimation, is 30 to 35%. The  left ventricle has moderate  to severely decreased function. The left  ventricle demonstrates global hypokinesis. The left ventricular internal  cavity size was mildly dilated. Left  ventricular diastolic parameters are consistent with Grade II diastolic  dysfunction (pseudonormalization). Elevated left atrial pressure.   2. Right ventricular systolic function is mildly reduced. The right  ventricular size is normal.   3. Left atrial size was mildly dilated.   4. The mitral valve is degenerative. Moderate mitral valve regurgitation.  No evidence of mitral stenosis.   5. The aortic valve is tricuspid. Aortic valve regurgitation is not  visualized. Mild aortic valve sclerosis is present, with no evidence of  aortic valve stenosis.   6. The inferior vena cava is normal in size with greater than 50%  respiratory variability, suggesting right atrial pressure of 3 mmHg.   7. Evidence of atrial level shunting detected by color flow Doppler.    Assessment & Recommendations:  70 year old [name] speaking patient with uncontrolled type 2 diabetes mellitus, mixed hyperlipidemia, tobacco dependence, coronary artery disease with prior MI, LCX and RCA stents in 2012, now with CABG X 3.  LIMA LAD, RSVG PDA, OM1  (08/23/2020), HFrEF  CAD:  S/p CABG X 3.  LIMA LAD, RSVG PDA, OM1  (08/23/2020) Aspirin 325 mg for one month Given ACS presentation prior to CABG, recommend DAPT with Aspirin and plavix after 1 month Continue rosuvastatin 40 mg. Symptomatic sinus pause pre CABG, most likely in the setting of ischemia. He has not had any pauses since then.  Tolerating bisoprolol 2.5 mg without any bradycardia  HFrEF: EF 30-35%. Ischemic cardiomyopathy. Euvolumic.  15 L net negative Continue bisoprolol 2.5 mg daily, corlanor 5 mg bid, spironolactone 12.5 mg daily, Farxiga 10 mg daily. 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 Arranged outpatient fu on 12/8.    [name], MD Pager: [phone] Office: [phone]

## 2020-08-30 NOTE — Progress Notes (Signed)
Mobility Specialist: Progress Note   08/30/20 1206  Mobility  Activity Ambulated in hall  Level of Assistance Contact guard assist, steadying assist  Assistive Device None  Distance Ambulated (ft) 470 ft  Mobility Ambulated with assistance in hallway  Mobility Response Tolerated well  Mobility performed by Mobility specialist  Bed Position Chair  $Mobility charge 1 Mobility   Pre-Mobility: 69 HR Post-Mobility: 71 HR, 126/58 BP, 97% SpO2  Pt reluctant but agreeable to ambulation, asx throughout. Pt a little unsteady during ambulation, gait belt used for assistance. Pt to recliner with call bell and phone in reach.   [facility] Makensie Mulhall Mobility Specialist Mobility Specialist Phone: [phone]

## 2020-08-30 NOTE — Progress Notes (Signed)
Inpatient Diabetes Program Recommendations  AACE/ADA: New Consensus Statement on Inpatient Glycemic Control (2015)  Target Ranges:  Prepandial:   less than 140 mg/dL      Peak postprandial:   less than 180 mg/dL (1-2 hours)      Critically ill patients:  140 - 180 mg/dL   Lab Results  Component Value Date   GLUCAP 238 (H) 08/30/2020   HGBA1C 9.4 (H) 08/23/2020    Review of Glycemic Control Results for [name], Colin [name] (MRN [id number]) as of 08/30/2020 09:19  Ref. Range 08/29/2020 06:33 08/29/2020 12:23 08/29/2020 16:10 08/29/2020 21:13 08/30/2020 06:30  Glucose-Capillary Latest Ref Range: 70 - 99 mg/dL 453 (H) 646 (H) 803 (H) 228 (H) 238 (H)    Diabetes history: DM 2 Outpatient Diabetes medications:  Lantus 30 units q AM Current orders for Inpatient glycemic control:  Novolog 0-24 units tid and hs Levemir 15 units daily  Glucerna tid between meals  Inpatient Diabetes Program Recommendations:   -  Increase Levemir to 20 units -  Reduce Novolog Correction scale to 0-15 units tid + hs -  Add Novolog 4 units tid meal coverage if eating >50% of meals  Christena Deem RN, MSN, BC-ADM Inpatient Diabetes Coordinator Team Pager [phone] (8a-5p)

## 2020-08-30 NOTE — Progress Notes (Signed)
CARDIAC REHAB PHASE I   Offered to walk with pt. Pt states he is too tired this morning and wants to finish breakfast. Will continue to follow and encourage ambulation.  Reynold Bowen, RN BSN 08/30/2020 9:20 AM

## 2020-08-31 ENCOUNTER — Other Ambulatory Visit: Payer: Self-pay

## 2020-08-31 ENCOUNTER — Other Ambulatory Visit (HOSPITAL_COMMUNITY): Payer: Self-pay

## 2020-08-31 LAB — GLUCOSE, CAPILLARY
Glucose-Capillary: 145 mg/dL — ABNORMAL HIGH (ref 70–99)
Glucose-Capillary: 216 mg/dL — ABNORMAL HIGH (ref 70–99)

## 2020-08-31 MED ORDER — ASPIRIN EC 81 MG PO TBEC: 81.0000 mg | DELAYED_RELEASE_TABLET | Freq: Every day | ORAL | 2 refills | Status: DC

## 2020-08-31 MED ORDER — ROSUVASTATIN CALCIUM 40 MG PO TABS
40.0000 mg | ORAL_TABLET | Freq: Every day | ORAL | 1 refills | Status: DC
  Filled 2020-08-31 – 2020-09-07 (×2): qty 30, 30d supply, fill #0
  Filled 2020-10-04: qty 30, 30d supply, fill #1

## 2020-08-31 MED ORDER — CLOPIDOGREL BISULFATE 75 MG PO TABS
75.0000 mg | ORAL_TABLET | Freq: Every day | ORAL | 1 refills | Status: DC
  Filled 2020-08-31 – 2020-09-07 (×2): qty 30, 30d supply, fill #0
  Filled 2020-09-27 – 2020-10-04 (×2): qty 30, 30d supply, fill #1

## 2020-08-31 MED ORDER — MIDODRINE HCL 2.5 MG PO TABS
2.5000 mg | ORAL_TABLET | Freq: Three times a day (TID) | ORAL | 1 refills | Status: DC
  Filled 2020-08-31: qty 90, 30d supply, fill #0
  Filled 2020-10-04: qty 90, 30d supply, fill #1

## 2020-08-31 MED ORDER — DAPAGLIFLOZIN PROPANEDIOL 10 MG PO TABS
10.0000 mg | ORAL_TABLET | Freq: Every day | ORAL | 1 refills | Status: DC
  Filled 2020-08-31 – 2020-09-07 (×2): qty 30, 30d supply, fill #0
  Filled 2020-09-27 – 2020-10-04 (×2): qty 30, 30d supply, fill #1

## 2020-08-31 MED ORDER — INSULIN GLARGINE 100 UNIT/ML SOLOSTAR PEN
20.0000 [IU] | PEN_INJECTOR | Freq: Every morning | SUBCUTANEOUS | 0 refills | Status: DC
  Filled 2020-08-31: qty 72, 360d supply, fill #0

## 2020-08-31 MED ORDER — OXYCODONE HCL 5 MG PO TABS
5.0000 mg | ORAL_TABLET | Freq: Four times a day (QID) | ORAL | 0 refills | Status: DC | PRN
  Filled 2020-08-31: qty 28, 7d supply, fill #0

## 2020-08-31 MED ORDER — OXYCODONE HCL 5 MG PO TABS
5.0000 mg | ORAL_TABLET | Freq: Four times a day (QID) | ORAL | 0 refills | Status: DC | PRN
  Filled 2020-08-31: qty 30, 8d supply, fill #0

## 2020-08-31 MED ORDER — SPIRONOLACTONE 25 MG PO TABS
12.5000 mg | ORAL_TABLET | Freq: Every day | ORAL | 1 refills | Status: DC
  Filled 2020-08-31: qty 45, 90d supply, fill #0
  Filled 2020-09-07: qty 30, 60d supply, fill #0

## 2020-08-31 MED ORDER — BISOPROLOL FUMARATE 5 MG PO TABS
2.5000 mg | ORAL_TABLET | Freq: Every day | ORAL | 1 refills | Status: DC
  Filled 2020-08-31 – 2020-09-07 (×2): qty 30, 60d supply, fill #0

## 2020-08-31 MED ORDER — IVABRADINE HCL 5 MG PO TABS
5.0000 mg | ORAL_TABLET | Freq: Two times a day (BID) | ORAL | 0 refills | Status: DC
  Filled 2020-08-31 – 2020-10-04 (×2): qty 60, 30d supply, fill #0

## 2020-08-31 NOTE — Progress Notes (Addendum)
      [address]       [place] [address]             [phone]      8 Days Post-Op Procedure(s) (LRB): CORONARY ARTERY BYPASS GRAFTING (CABG) x 3 ON CARDIOPULMONARY BYPASS USING EVH AND PARTIALLY OPEN HARVESTED LEFT GREATER SAPHENOUS VEIN AND LEFT INTERNAL MAMMARY ARTERY.  LIMA TO LAD, SVG TO OM, SVG TO PDA (N/A) TRANSESOPHAGEAL ECHOCARDIOGRAM (TEE) (N/A) APPLICATION OF CELL SAVER (N/A) ENDOVEIN HARVEST AND PARTIALLY OPEN GREATER SAPHENOUS VEIN HARVESTING (Left) Subjective: Feels okay this morning, still having some pain but the medication helps.   Objective: Vital signs in last 24 hours: Temp:  [97.6 F (36.4 C)-98.5 F (36.9 C)] 98.1 F (36.7 C) (07/27 0515) Pulse Rate:  [66-68] 66 (07/27 0515) Cardiac Rhythm: Normal sinus rhythm (07/26 1918) Resp:  [14-17] 15 (07/27 0515) BP: (119-130)/(56-69) 122/56 (07/27 0515) SpO2:  [94 %-98 %] 97 % (07/27 0515)     Intake/Output from previous day: 07/26 0701 - 07/27 0700 In: 358 [P.O.:358] Out: 1050 [Urine:1050] Intake/Output this shift: No intake/output data recorded.  General appearance: alert, cooperative, and no distress Heart: regular rate and rhythm, S1, S2 normal, no murmur, click, rub or gallop Lungs: clear to auscultation bilaterally Abdomen: soft, non-tender; bowel sounds normal; no masses,  no organomegaly Extremities: extremities normal, atraumatic, no cyanosis or edema Wound: clean and dry  Lab Results: Recent Labs    08/29/20 0051  WBC 6.4  HGB 11.5*  HCT 35.3*  PLT 353   BMET:  Recent Labs    08/29/20 0051  NA 137  K 4.4  CL 98  CO2 30  GLUCOSE 199*  BUN 37*  CREATININE 1.34*  CALCIUM 8.8*    PT/INR: No results for input(s): LABPROT, INR in the last 72 hours. ABG    Component Value Date/Time   PHART 7.366 08/24/2020 0030   HCO3 20.4 08/24/2020 0030   TCO2 21 (L) 08/24/2020 0030   ACIDBASEDEF 4.0 (H) 08/24/2020 0030   O2SAT 68.9 08/28/2020 0415   CBG (last 3)   Recent Labs    08/30/20 1612 08/30/20 2114 08/31/20 0614  GLUCAP 183* 258* 145*    Assessment/Plan: S/P Procedure(s) (LRB): CORONARY ARTERY BYPASS GRAFTING (CABG) x 3 ON CARDIOPULMONARY BYPASS USING EVH AND PARTIALLY OPEN HARVESTED LEFT GREATER SAPHENOUS VEIN AND LEFT INTERNAL MAMMARY ARTERY.  LIMA TO LAD, SVG TO OM, SVG TO PDA (N/A) TRANSESOPHAGEAL ECHOCARDIOGRAM (TEE) (N/A) APPLICATION OF CELL SAVER (N/A) ENDOVEIN HARVEST AND PARTIALLY OPEN GREATER SAPHENOUS VEIN HARVESTING (Left)  CV- NSR in the 60s, BP well controlled. Continue asa and statin. Tolerating bisoprolol and Corlanor. Continue midodrine for BP. Ischemic cardiomyopathy-appreciate Cardiology's assistance.  Pulm- Tolerating room air with acceptable oxygen saturation. Continue to encourage incentive spirometer and ambulation Renal-creatinine 1.34, stable, potassium 4.4. Continue lasix for fluid overload. H and H 11.5/35.3, expected acute blood loss anemia Endo- blood glucose well controlled, Farxiga added Will add Plavix and change ASA to 81 on discharge for NSTEMI presentation.    Plan: Only PVCs on tele and no bradycardia after initiation of bisoprolol, will discontinue EPW this morning with hopeful discharge later today once home health PT has been set up.    LOS: 11 days    Sharlene Dory 08/31/2020   Agree with above Dispo planning  Camilah Spillman O Naylah Cork

## 2020-08-31 NOTE — Consult Note (Signed)
   [facility] CM Inpatient Consult   08/31/2020  Lavonta Tillis [facility] [birth year] [id number]  [facility] [[facility]]  Accountable Care Organization [ACO] Patient: [facility] Medicare  Patient is followed by the Advanced HF team. Spoke with inpatient HF TOC RN CM.  Charlesetta Shanks, RN BSN CCM [facility]  [phone] business mobile phone Toll free office [phone]  Fax number: [phone] [email or web address]

## 2020-08-31 NOTE — Progress Notes (Signed)
Mobility Specialist: Progress Note   08/31/20 1108  Mobility  Activity Ambulated in hall  Level of Assistance Contact guard assist, steadying assist  Assistive Device None  Distance Ambulated (ft) 470 ft  Mobility Ambulated with assistance in hallway  Mobility Response Tolerated well  Mobility performed by Mobility specialist  Bed Position Chair  $Mobility charge 1 Mobility   Pre-Mobility: 63 HR, 95% SpO2 Post-Mobility: 71 HR, 114/57 BP, 97% SpO2  Pt c/o minimal pain during ambulation, no rating given, otherwise asx. Pt to recliner after walk with call bell and phone in reach.   [facility] Assunta Pupo Mobility Specialist Mobility Specialist Phone: [phone]

## 2020-08-31 NOTE — TOC Initial Note (Signed)
Transition of Care [facility]) - Initial/Assessment Note    Patient Details  Name: Colin [name] MRN: [id number] Date of Birth: 01-19-1951  Transition of Care [facility]) CM/SW Contact:    Elliot Cousin, RN Phone Number: [phone] 08/31/2020, 10:10 AM  Clinical Narrative:                 TOC CM spoke to pt and gave permission to speak to son, [name] or [name]. Son, [name] lives in home and assist both him and wife. Son does not work. Offered choice for [facility]. Agreeable to [facility] for HH. Contacted rep, Stacie with new referral. Contacted [employer] rep, Velna Hatchet for Rollator and bedside commode for home.   Expected Discharge Plan: Home w Home Health Services     Patient Goals and CMS Choice Patient states their goals for this hospitalization and ongoing recovery are:: yes, ready to go home CMS Medicare.gov Compare Post Acute Care list provided to:: Patient Choice offered to / list presented to : Patient  Expected Discharge Plan and Services Expected Discharge Plan: Home w Home Health Services In-house Referral: Clinical Social Work Discharge Planning Services: CM Consult Post Acute Care Choice: Home Health Living arrangements for the past 2 months: Single Family Home                   DME Agency: [employer] Date DME Agency Contacted: 08/31/20   Representative spoke with at DME Agency: Velna Hatchet HH Arranged: RN, PT [facility] Agency: [facility] Date [facility] Agency Contacted: 08/31/20 Time HH Agency Contacted: 1009 Representative spoke with at [facility] Agency: Stacie  Prior Living Arrangements/Services Living arrangements for the past 2 months: Single Family Home Lives with:: Spouse, Adult Children Patient language and need for interpreter reviewed:: Yes Do you feel safe going back to the place where you live?: Yes      Need for Family Participation in Patient Care: Yes (Comment) Care giver support system in place?: Yes (comment)   Criminal Activity/Legal Involvement Pertinent to Current  Situation/Hospitalization: No - Comment as needed  Activities of Daily Living Home Assistive Devices/Equipment: None ADL Screening (condition at time of admission) Patient's cognitive ability adequate to safely complete daily activities?: Yes Is the patient deaf or have difficulty hearing?: No Does the patient have difficulty seeing, even when wearing glasses/contacts?: No Does the patient have difficulty concentrating, remembering, or making decisions?: No Patient able to express need for assistance with ADLs?: Yes Does the patient have difficulty dressing or bathing?: No Independently performs ADLs?: No Communication: Independent with device (comment) (needs interpreter) Dressing (OT): Independent Grooming: Independent Feeding: Independent Bathing: Independent Toileting: Independent In/Out Bed: Independent Walks in Home: Independent Does the patient have difficulty walking or climbing stairs?: No Weakness of Legs: None Weakness of Arms/Hands: None  Permission Sought/Granted Permission sought to share information with : Case Manager, PCP, Family Supports Permission granted to share information with : Yes, Verbal Permission Granted  Share Information with NAME: [name] Bayron  Permission granted to share info w AGENCY: Home Health, DME  Permission granted to share info w Relationship: son  Permission granted to share info w Contact Information: [phone]  Emotional Assessment Appearance:: Appears stated age Attitude/Demeanor/Rapport: Gracious, Engaged Affect (typically observed): Accepting Orientation: : Oriented to Self, Oriented to Place, Oriented to  Time, Oriented to Situation   Psych Involvement: No (comment)  Admission diagnosis:  Acute pulmonary edema (HCC) [J81.0] Hyperglycemia [R73.9] NSTEMI (non-ST elevated myocardial infarction) (HCC) [I21.4] New onset of congestive heart failure (HCC) [I50.9]  Coronary artery disease [I25.10] Patient Active Problem List    Diagnosis Date Noted   Protein-calorie malnutrition, severe 08/29/2020   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   Coronary artery disease 08/23/2020   HFrEF (heart failure with reduced ejection fraction) (HCC)    Sinus pause    New onset of congestive heart failure (HCC) 08/20/2020   Acute pulmonary edema (HCC)    Hyperglycemia    NSTEMI (non-ST elevated myocardial infarction) (HCC)    Shortness of breath    DM type 2 with diabetic mixed hyperlipidemia (HCC)    Smoking    Coronary atherosclerosis due to calcified coronary lesion    Thoracic aorta atherosclerosis (HCC)    Essential hypertension 10/07/2019   Atherosclerosis of native coronary artery of native heart with unstable angina pectoris (HCC) 04/26/2010   Diabetes mellitus (HCC) 04/26/2010   Dyslipidemia 04/26/2010   PCP:  Grayce Sessions, NP Pharmacy:   [facility] [address] [place] [address] Phone: [phone] Fax: [phone]  [facility] [place], [place] - [address] [place] [address] Phone: [phone] Fax: [phone]     Social Determinants of Health (SDOH) Interventions    Readmission Risk Interventions No flowsheet data found.

## 2020-08-31 NOTE — Progress Notes (Signed)
CARDIAC REHAB PHASE I   D/c education completed with pt and son. Pt educated on importance of site care and monitoring incisions daily. Encouraged continued IS use, walks, and sternal precautions. Pt given in-the-tube sheet along with heart healthy diets. Reviewed restrictions and exercise guidelines. Will refer to CRP II [place].  [id number] Reynold Bowen, RN BSN 08/31/2020 2:37 PM

## 2020-08-31 NOTE — Progress Notes (Signed)
Inpatient Diabetes Program Recommendations  AACE/ADA: New Consensus Statement on Inpatient Glycemic Control   Target Ranges:  Prepandial:   less than 140 mg/dL      Peak postprandial:   less than 180 mg/dL (1-2 hours)      Critically ill patients:  140 - 180 mg/dL   Results for Colin [name], Colin [name] (MRN [id number]) as of 08/31/2020 12:43  Ref. Range 08/30/2020 06:30 08/30/2020 11:03 08/30/2020 16:12 08/30/2020 21:14 08/31/2020 06:14 08/31/2020 11:12  Glucose-Capillary Latest Ref Range: 70 - 99 mg/dL 768 (H) 088 (H) 110 (H) 258 (H) 145 (H) 216 (H)    Review of Glycemic Control  Current orders for Inpatient glycemic control: Levemir 18 units daily, Novolog 0-20 units AC&HS, Farxiga 10 mg daily  Inpatient Diabetes Program Recommendations:    Insulin: Please consider ordering Novolog 3 units TID with meals for meal coverage if patient eats at least 50% of meals.   Thanks, Orlando Penner, RN, MSN, CDE Diabetes Coordinator Inpatient Diabetes Program [phone] (Team Pager from 8am to 5pm)

## 2020-08-31 NOTE — Progress Notes (Signed)
Occupational Therapy Treatment Patient Details Name: Colin [name] MRN: [id number] DOB: 06-23-1950 Today's Date: 08/31/2020    History of present illness 70y/o male admitted 7/16 with chest pain, NSTEMI, and new onset CHF. Pt presented with periodic chest pain, SOB, and weakness. 7/18: cath and temporary. 7/19: CABG x3. PMH includes HTN, HLD, acute HF, and DM   OT comments  Pt making increased progress with OT goals this session. Pt presenting with increased activity tolerance this session, tolerating further functional mobility and standing tolerance for ADL's at the sink, after ambulating down the hall with mobility specialist. Pt required no physical assistance this session with any ADL's or functional mobility, recalled and demonstrated 3/3 sternal precautions, and reported no pain. OT will continue to follow pt acutely.   Follow Up Recommendations  Home health OT;Supervision - Intermittent    Equipment Recommendations  3 in 1 bedside commode    Recommendations for Other Services      Precautions / Restrictions Precautions Precautions: Fall;Sternal Precaution Booklet Issued: Yes (comment) Precaution Comments: Reviewed, able to recall 3/3 sternal precautions and demonstrated good adherance. Restrictions Weight Bearing Restrictions: Yes Other Position/Activity Restrictions: sternal precautions       Mobility Bed Mobility               General bed mobility comments: Pt in recliner on arrival    Transfers Overall transfer level: Needs assistance Equipment used: None Transfers: Sit to/from Stand Sit to Stand: Supervision         General transfer comment: No assist needed nor AD for all trasnfers    Balance Overall balance assessment: Mild deficits observed, not formally tested                                         ADL either performed or assessed with clinical judgement   ADL Overall ADL's : Needs assistance/impaired     Grooming:  Wash/dry hands;Wash/dry face;Oral care;Supervision/safety;Standing Grooming Details (indicate cue type and reason): completed at sink                 Toilet Transfer: Supervision/safety;Ambulation Toilet Transfer Details (indicate cue type and reason): No AD, ambulated in room safely, no assistance needed with sit<>stands         Functional mobility during ADLs: Supervision/safety General ADL Comments: Pt participating with therapy at a higher level and able to comprehend and perform tasks while keeping precautions.     Vision   Vision Assessment?: No apparent visual deficits   Perception     Praxis      Cognition Arousal/Alertness: Awake/alert Behavior During Therapy: WFL for tasks assessed/performed Overall Cognitive Status: Within Functional Limits for tasks assessed                                          Exercises     Shoulder Instructions       General Comments VSS on Ra    Pertinent Vitals/ Pain       Pain Assessment: No/denies pain  Home Living                                          Prior Functioning/Environment  Frequency  Min 2X/week        Progress Toward Goals  OT Goals(current goals can now be found in the care plan section)  Progress towards OT goals: Progressing toward goals  Acute Rehab OT Goals Patient Stated Goal: to return home. OT Goal Formulation: With patient Time For Goal Achievement: 09/09/20 Potential to Achieve Goals: Good ADL Goals Pt Will Perform Grooming: with modified independence;standing Pt Will Perform Lower Body Dressing: with modified independence;sit to/from stand Pt Will Transfer to Toilet: with modified independence;bedside commode;ambulating Pt Will Perform Toileting - Clothing Manipulation and hygiene: with modified independence;sit to/from stand Additional ADL Goal #1: Pt will verbalize sternal precautions and adhere to during ADL routine with  independence.  Plan Discharge plan remains appropriate    Co-evaluation                 AM-PAC OT "6 Clicks" Daily Activity     Outcome Measure   Help from another person eating meals?: None Help from another person taking care of personal grooming?: A Little Help from another person toileting, which includes using toliet, bedpan, or urinal?: A Little Help from another person bathing (including washing, rinsing, drying)?: A Little Help from another person to put on and taking off regular upper body clothing?: A Little Help from another person to put on and taking off regular lower body clothing?: A Little 6 Click Score: 19    End of Session Equipment Utilized During Treatment: Gait belt  OT Visit Diagnosis: Other abnormalities of gait and mobility (R26.89);Muscle weakness (generalized) (M62.81);Pain Pain - part of body:  (Incision on sternum)   Activity Tolerance Patient tolerated treatment well   Patient Left in chair;with call bell/phone within reach   Nurse Communication Mobility status        Time: 2878-6767 OT Time Calculation (min): 18 min  Charges: OT General Charges $OT Visit: 1 Visit OT Treatments $Self Care/Home Management : 8-22 mins  Kiril Hippe H., OTR/L Acute Rehabilitation  Merril Nagy Elane Hy Swiatek 08/31/2020, 11:34 AM

## 2020-09-01 ENCOUNTER — Other Ambulatory Visit: Payer: Self-pay

## 2020-09-01 NOTE — Patient Outreach (Signed)
[facility]) Care Management  09/01/2020  Colin [name] [facility] Nov 11, 1950 [id number]   Referral Date: 09/01/20 Referral Source: [employer] Report Date of Discharge: 08/31/20 Facility:  [facility] Insurance: [facility]   Referral received.  No outreach warranted at this time.  Transition of Care calls being completed via EMMI. Patient also followed by Advanced heart failure team per hospital liaison Charlesetta Shanks, RN.   Plan: RN CM will close case.    Colin Leriche, RN, MSN [facility] Care Management Care Management Coordinator Direct Line [phone] Toll Free: [phone]  Fax: [phone]

## 2020-09-02 ENCOUNTER — Telehealth: Payer: Medicare HMO | Admitting: Thoracic Surgery (Cardiothoracic Vascular Surgery)

## 2020-09-06 ENCOUNTER — Telehealth (HOSPITAL_COMMUNITY): Payer: Self-pay

## 2020-09-06 DIAGNOSIS — E1169 Type 2 diabetes mellitus with other specified complication: Secondary | ICD-10-CM | POA: Diagnosis not present

## 2020-09-06 DIAGNOSIS — E43 Unspecified severe protein-calorie malnutrition: Secondary | ICD-10-CM | POA: Diagnosis not present

## 2020-09-06 DIAGNOSIS — I2584 Coronary atherosclerosis due to calcified coronary lesion: Secondary | ICD-10-CM | POA: Diagnosis not present

## 2020-09-06 DIAGNOSIS — Z48812 Encounter for surgical aftercare following surgery on the circulatory system: Secondary | ICD-10-CM | POA: Diagnosis not present

## 2020-09-06 DIAGNOSIS — I214 Non-ST elevation (NSTEMI) myocardial infarction: Secondary | ICD-10-CM | POA: Diagnosis not present

## 2020-09-06 DIAGNOSIS — I25110 Atherosclerotic heart disease of native coronary artery with unstable angina pectoris: Secondary | ICD-10-CM | POA: Diagnosis not present

## 2020-09-06 DIAGNOSIS — I110 Hypertensive heart disease with heart failure: Secondary | ICD-10-CM | POA: Diagnosis not present

## 2020-09-06 DIAGNOSIS — I5020 Unspecified systolic (congestive) heart failure: Secondary | ICD-10-CM | POA: Diagnosis not present

## 2020-09-06 DIAGNOSIS — E782 Mixed hyperlipidemia: Secondary | ICD-10-CM | POA: Diagnosis not present

## 2020-09-06 NOTE — Telephone Encounter (Signed)
Pt insurance is active and benefits verified through [employer]. Co-pay $10.00, DED $0.00/$0.00 met, out of pocket $3,900.00/$421.63 met, co-insurance 0%. No pre-authorization required. Passport, 09/06/20 @ 10:23AM, REF#[id number]   Will contact patient to see if he is interested in the Cardiac Rehab Program. If interested, patient will need to complete follow up appt. Once completed, patient will be contacted for scheduling upon review by the RN Navigator. 

## 2020-09-06 NOTE — Telephone Encounter (Signed)
Called and spoke with pt son [name] who stated he will talk with his father in regards to CR. Explained scheduling process and went over insurance, verbalized understanding. Will contact patient for scheduling once f/u has been completed.

## 2020-09-07 ENCOUNTER — Telehealth (INDEPENDENT_AMBULATORY_CARE_PROVIDER_SITE_OTHER): Payer: Self-pay | Admitting: Primary Care

## 2020-09-07 ENCOUNTER — Other Ambulatory Visit: Payer: Self-pay

## 2020-09-07 NOTE — Telephone Encounter (Signed)
Home Health Verbal Orders - Caller/Agency: Erin// [facility] Callback Number: [phone] secure  Requesting OT/PT/Skilled Nursing/Social Work/Speech Therapy: PT Frequency: 1w9   She states that the pt only has 1-2 medications in the home off of his 12-15 medications on discharge paperwork. She states that the pt states that money is the issue. They are requesting to have a Social Work Evaluation. Please advise.

## 2020-09-07 NOTE — Telephone Encounter (Signed)
Left message asking Denny Peon to return call to [phone].

## 2020-09-08 NOTE — Telephone Encounter (Signed)
Verbals have been provided to [employer]. Maryjean Morn, CMA

## 2020-09-09 ENCOUNTER — Telehealth (INDEPENDENT_AMBULATORY_CARE_PROVIDER_SITE_OTHER): Payer: Self-pay | Admitting: Thoracic Surgery (Cardiothoracic Vascular Surgery)

## 2020-09-09 ENCOUNTER — Other Ambulatory Visit: Payer: Self-pay

## 2020-09-09 DIAGNOSIS — Z951 Presence of aortocoronary bypass graft: Secondary | ICD-10-CM

## 2020-09-09 NOTE — Progress Notes (Signed)
     [address]       Colin [name] [address]             [phone]       Patient: Home Provider: Office Consent for Telemedicine visit obtained.  Today's visit was completed via a real-time telehealth (see specific modality noted below). The patient/authorized person provided oral consent at the time of the visit to engage in a telemedicine encounter with the present provider at [facility]. The patient/authorized person was informed of the potential benefits, limitations, and risks of telemedicine. The patient/authorized person expressed understanding that the laws that protect confidentiality also apply to telemedicine. The patient/authorized person acknowledged understanding that telemedicine does not provide emergency services and that he or she would need to call 911 or proceed to the nearest hospital for help if such a need arose.   Total time spent in the clinical discussion 10 minutes.  Telehealth Modality: Phone visit (audio only)  I had a telephone visit with [name].  Overall doing well.  Ambulating well.    I will see him in clinic with a CXR in 1 month  Fisher Hargadon O Shauntell Iglesia

## 2020-09-14 DIAGNOSIS — E43 Unspecified severe protein-calorie malnutrition: Secondary | ICD-10-CM | POA: Diagnosis not present

## 2020-09-14 DIAGNOSIS — I110 Hypertensive heart disease with heart failure: Secondary | ICD-10-CM | POA: Diagnosis not present

## 2020-09-14 DIAGNOSIS — E1169 Type 2 diabetes mellitus with other specified complication: Secondary | ICD-10-CM | POA: Diagnosis not present

## 2020-09-14 DIAGNOSIS — I2584 Coronary atherosclerosis due to calcified coronary lesion: Secondary | ICD-10-CM | POA: Diagnosis not present

## 2020-09-14 DIAGNOSIS — I214 Non-ST elevation (NSTEMI) myocardial infarction: Secondary | ICD-10-CM | POA: Diagnosis not present

## 2020-09-14 DIAGNOSIS — Z48812 Encounter for surgical aftercare following surgery on the circulatory system: Secondary | ICD-10-CM | POA: Diagnosis not present

## 2020-09-14 DIAGNOSIS — I5020 Unspecified systolic (congestive) heart failure: Secondary | ICD-10-CM | POA: Diagnosis not present

## 2020-09-14 DIAGNOSIS — I25110 Atherosclerotic heart disease of native coronary artery with unstable angina pectoris: Secondary | ICD-10-CM | POA: Diagnosis not present

## 2020-09-14 DIAGNOSIS — E782 Mixed hyperlipidemia: Secondary | ICD-10-CM | POA: Diagnosis not present

## 2020-09-15 ENCOUNTER — Telehealth (INDEPENDENT_AMBULATORY_CARE_PROVIDER_SITE_OTHER): Payer: Self-pay | Admitting: Primary Care

## 2020-09-15 ENCOUNTER — Other Ambulatory Visit: Payer: Self-pay

## 2020-09-15 DIAGNOSIS — I110 Hypertensive heart disease with heart failure: Secondary | ICD-10-CM | POA: Diagnosis not present

## 2020-09-15 DIAGNOSIS — I2584 Coronary atherosclerosis due to calcified coronary lesion: Secondary | ICD-10-CM | POA: Diagnosis not present

## 2020-09-15 DIAGNOSIS — E43 Unspecified severe protein-calorie malnutrition: Secondary | ICD-10-CM | POA: Diagnosis not present

## 2020-09-15 DIAGNOSIS — E782 Mixed hyperlipidemia: Secondary | ICD-10-CM | POA: Diagnosis not present

## 2020-09-15 DIAGNOSIS — I25110 Atherosclerotic heart disease of native coronary artery with unstable angina pectoris: Secondary | ICD-10-CM | POA: Diagnosis not present

## 2020-09-15 DIAGNOSIS — I214 Non-ST elevation (NSTEMI) myocardial infarction: Secondary | ICD-10-CM | POA: Diagnosis not present

## 2020-09-15 DIAGNOSIS — I5020 Unspecified systolic (congestive) heart failure: Secondary | ICD-10-CM | POA: Diagnosis not present

## 2020-09-15 DIAGNOSIS — E1169 Type 2 diabetes mellitus with other specified complication: Secondary | ICD-10-CM | POA: Diagnosis not present

## 2020-09-15 DIAGNOSIS — Z48812 Encounter for surgical aftercare following surgery on the circulatory system: Secondary | ICD-10-CM | POA: Diagnosis not present

## 2020-09-15 NOTE — Progress Notes (Deleted)
Date:  09/15/2020   ID:  [name], DOB 02-17-1950, MRN [id number]  PCP:  Colin Perna, NP  Cardiologist:  Colin Kras, DO, [facility]  (established care 09/16/2020) Former Cardiology Providers: ***  REASON FOR CONSULT: ***  REQUESTING PHYSICIAN:  Colin Perna, NP [address] [place],  [place] [address]  No chief complaint on file.   HPI  Colin [name] is a 70 y.o. male who presents to the office with a chief complaint of "***." Patient's past medical history and cardiovascular risk factors include: hypertension, ***  He is referred to the office at the request of Colin Perna, NP for evaluation of ***.  ***  History of  Denies prior history of coronary artery disease, myocardial infarction, congestive heart failure, deep venous thrombosis, pulmonary embolism, stroke, transient ischemic attack.  FUNCTIONAL STATUS: ***   ALLERGIES: No Known Allergies  MEDICATION LIST PRIOR TO VISIT: No outpatient medications have been marked as taking for the 09/16/20 encounter (Appointment) with Colin Kras, DO.     PAST MEDICAL HISTORY: Past Medical History:  Diagnosis Date   Arrhythmia    CAD (coronary artery disease)    stent x 2   Carotid artery occlusion    Diabetes mellitus    Dyslipidemia    Essential hypertension     PAST SURGICAL HISTORY: Past Surgical History:  Procedure Laterality Date   CARDIAC CATHETERIZATION     01/2010 ; 04/2010   CORONARY ARTERY BYPASS GRAFT N/A 08/23/2020   Procedure: CORONARY ARTERY BYPASS GRAFTING (CABG) x 3 ON CARDIOPULMONARY BYPASS USING EVH AND PARTIALLY OPEN HARVESTED LEFT GREATER SAPHENOUS VEIN AND LEFT INTERNAL MAMMARY ARTERY.  LIMA TO LAD, SVG TO OM, SVG TO PDA;  Surgeon: Colin Matte, MD;  Location: [place];  Service: Open Heart Surgery;  Laterality: N/A;   RIGHT/LEFT HEART CATH AND CORONARY ANGIOGRAPHY N/A 08/22/2020   Procedure: RIGHT/LEFT HEART CATH AND CORONARY ANGIOGRAPHY;  Surgeon: Colin Mormon,  MD;  Location: [place] [facility];  Service: Cardiovascular;  Laterality: N/A;   TEE WITHOUT CARDIOVERSION N/A 08/23/2020   Procedure: TRANSESOPHAGEAL ECHOCARDIOGRAM (TEE);  Surgeon: Colin Matte, MD;  Location: [place];  Service: Open Heart Surgery;  Laterality: N/A;   TEMPORARY PACEMAKER N/A 08/22/2020   Procedure: TEMPORARY PACEMAKER;  Surgeon: Colin Mormon, MD;  Location: [place] [facility];  Service: Cardiovascular;  Laterality: N/A;   VEIN HARVEST Left 08/23/2020   Procedure: ENDOVEIN HARVEST AND PARTIALLY OPEN GREATER SAPHENOUS VEIN HARVESTING;  Surgeon: Colin Matte, MD;  Location: [place];  Service: Open Heart Surgery;  Laterality: Left;    FAMILY HISTORY: The patient family history is not on file.  SOCIAL HISTORY:  The patient  reports that he has been smoking cigarettes. He has been smoking an average of .25 packs per day. He has never used smokeless tobacco. He reports that he does not drink alcohol and does not use drugs.  REVIEW OF SYSTEMS: ROS  PHYSICAL EXAM: Vitals with BMI 08/31/2020 08/31/2020 08/31/2020  Height - - -  Weight - - -  BMI - - -  Systolic 517 001 749  Diastolic 57 59 56  Pulse 65 72 66    CONSTITUTIONAL: Well-developed and well-nourished. No acute distress.  SKIN: Skin is warm and dry. No rash noted. No cyanosis. No pallor. No jaundice HEAD: Normocephalic and atraumatic.  EYES: No scleral icterus MOUTH/THROAT: Moist oral membranes.  NECK: No JVD present. No thyromegaly noted. No carotid bruits  LYMPHATIC:  No visible cervical adenopathy.  CHEST Normal respiratory effort. No intercostal retractions  LUNGS: *** No stridor. No wheezes. No rales.  CARDIOVASCULAR: *** ABDOMINAL: No apparent ascites.  EXTREMITIES: No peripheral edema  HEMATOLOGIC: No significant bruising NEUROLOGIC: Oriented to person, place, and time. Nonfocal. Normal muscle tone.  PSYCHIATRIC: Normal mood and affect. Normal behavior. Cooperative  CARDIAC  DATABASE: EKG: ***  Echocardiogram: No results found for this or any previous visit from the past 1095 days.    Stress Testing: No results found for this or any previous visit from the past 1095 days.   Heart Catheterization: ***  LABORATORY DATA: CBC Latest Ref Rng & Units 08/29/2020 08/28/2020 08/27/2020  WBC 4.0 - 10.5 K/uL 6.4 6.1 7.3  Hemoglobin 13.0 - 17.0 g/dL 11.5(L) 10.0(L) 9.3(L)  Hematocrit 39.0 - 52.0 % 35.3(L) 30.6(L) 28.4(L)  Platelets 150 - 400 K/uL 353 245 180    CMP Latest Ref Rng & Units 08/29/2020 08/28/2020 08/27/2020  Glucose 70 - 99 mg/dL 199(H) 126(H) 197(H)  BUN 8 - 23 mg/dL 37(H) 31(H) 29(H)  Creatinine 0.61 - 1.24 mg/dL 1.34(H) 1.36(H) 1.51(H)  Sodium 135 - 145 mmol/L 137 139 137  Potassium 3.5 - 5.1 mmol/L 4.4 4.1 4.2  Chloride 98 - 111 mmol/L 98 100 99  CO2 22 - 32 mmol/L 30 30 32  Calcium 8.9 - 10.3 mg/dL 8.8(L) 8.3(L) 8.3(L)  Total Protein 6.5 - 8.1 g/dL - - -  Total Bilirubin 0.3 - 1.2 mg/dL - - -  Alkaline Phos 38 - 126 U/L - - -  AST 15 - 41 U/L - - -  ALT 0 - 44 U/L - - -    Lipid Panel     Component Value Date/Time   CHOL 187 08/21/2020 0039   CHOL 171 12/17/2019 1101   TRIG 53 08/21/2020 0039   HDL 40 (L) 08/21/2020 0039   HDL 41 12/17/2019 1101   CHOLHDL 4.7 08/21/2020 0039   VLDL 11 08/21/2020 0039   LDLCALC 136 (H) 08/21/2020 0039   LDLCALC 114 (H) 12/17/2019 1101   LDLDIRECT 137.1 (H) 08/21/2020 0039   LABVLDL 16 12/17/2019 1101    No components found for: NTPROBNP No results for input(s): PROBNP in the last 8760 hours. No results for input(s): TSH in the last 8760 hours.  BMP Recent Labs    09/30/19 1627 12/17/19 1101 05/06/20 1351 08/27/20 0500 08/28/20 0415 08/29/20 0051  NA 133* 142   < > 137 139 137  K 4.0 4.7   < > 4.2 4.1 4.4  CL 96* 104   < > 99 100 98  CO2 25 27   < > 32 30 30  GLUCOSE 484* 112*   < > 197* 126* 199*  BUN 34* 23   < > 29* 31* 37*  CREATININE 1.44* 1.15   < > 1.51* 1.36* 1.34*  CALCIUM  9.5 9.5   < > 8.3* 8.3* 8.8*  GFRNONAA 49* 65   < > 49* 56* 57*  GFRAA 57* 75  --   --   --   --    < > = values in this interval not displayed.    HEMOGLOBIN A1C Lab Results  Component Value Date   HGBA1C 9.4 (H) 08/23/2020   MPG 223.08 08/23/2020    IMPRESSION:  No diagnosis found.   RECOMMENDATIONS: Colin [name] is a 70 y.o. male whose past medical history and cardiac risk factors include: ***   FINAL MEDICATION LIST END OF ENCOUNTER: No  orders of the defined types were placed in this encounter.   There are no discontinued medications.   Current Outpatient Medications:    Accu-Chek Softclix Lancets lancets, Use to check blood sugar three times daily., Disp: 100 each, Rfl: 6   aspirin EC 81 MG tablet, Take 1 tablet (81 mg total) by mouth daily. Swallow whole., Disp: 50 tablet, Rfl: 2   bisoprolol (ZEBETA) 5 MG tablet, Take 0.5 tablets (2.5 mg total) by mouth daily., Disp: 30 tablet, Rfl: 1   Blood Glucose Monitoring Suppl (ACCU-CHEK GUIDE) w/Device KIT, USE AS DIRECTED, Disp: 1 kit, Rfl: 0   clopidogrel (PLAVIX) 75 MG tablet, Take 1 tablet (75 mg total) by mouth daily., Disp: 30 tablet, Rfl: 1   dapagliflozin propanediol (FARXIGA) 10 MG TABS tablet, Take 1 tablet (10 mg total) by mouth daily., Disp: 30 tablet, Rfl: 1   gabapentin (NEURONTIN) 100 MG capsule, TAKE 1 CAPSULE (100 MG TOTAL) BY MOUTH 3 (THREE) TIMES DAILY., Disp: 270 capsule, Rfl: 1   glucose blood (ACCU-CHEK GUIDE) test strip, 1 each by Other route 2 (two) times daily. And lancets 2/day, Disp: 100 each, Rfl: 6   insulin glargine (LANTUS) 100 UNIT/ML Solostar Pen, Inject 20 Units into the skin every morning., Disp: 72 mL, Rfl: 0   Insulin Pen Needle (TRUEPLUS PEN NEEDLES) 32G X 4 MM MISC, Use as instructed to inject Lantus and Victoza once daily., Disp: 100 each, Rfl: 0   ivabradine (CORLANOR) 5 MG TABS tablet, Take 1 tablet (5 mg total) by mouth 2 (two) times daily with a meal., Disp: 60 tablet, Rfl: 0   midodrine  (PROAMATINE) 2.5 MG tablet, Take 1 tablet (2.5 mg total) by mouth 3 (three) times daily with meals., Disp: 90 tablet, Rfl: 1   oxyCODONE (OXY IR/ROXICODONE) 5 MG immediate release tablet, Take 1 tablet (5 mg total) by mouth every 6 (six) hours as needed for severe pain., Disp: 30 tablet, Rfl: 0   rosuvastatin (CRESTOR) 40 MG tablet, Take 1 tablet (40 mg total) by mouth at bedtime., Disp: 30 tablet, Rfl: 1   spironolactone (ALDACTONE) 25 MG tablet, Take 0.5 tablets (12.5 mg total) by mouth daily., Disp: 30 tablet, Rfl: 1  No orders of the defined types were placed in this encounter.   There are no Patient Instructions on file for this visit.   --Continue cardiac medications as reconciled in final medication list. --No follow-ups on file. Or sooner if needed. --Continue follow-up with your primary care physician regarding the management of your other chronic comorbid conditions.  Patient's questions and concerns were addressed to his satisfaction. He voices understanding of the instructions provided during this encounter.   This note was created using a voice recognition software as a result there may be grammatical errors inadvertently enclosed that do not reflect the nature of this encounter. Every attempt is made to correct such errors.  Colin [name], [place], [facility]  Pager: [phone] Office: [phone]

## 2020-09-15 NOTE — Telephone Encounter (Signed)
Redmond Baseman is calling from [facility] is calling to state that there will be a delay in Social Work because of difficulty reaching out to the pt for services. CB- [phone]

## 2020-09-15 NOTE — Telephone Encounter (Signed)
Noted  

## 2020-09-16 ENCOUNTER — Telehealth (INDEPENDENT_AMBULATORY_CARE_PROVIDER_SITE_OTHER): Payer: Self-pay | Admitting: Primary Care

## 2020-09-16 ENCOUNTER — Other Ambulatory Visit: Payer: Self-pay

## 2020-09-16 ENCOUNTER — Other Ambulatory Visit: Payer: Self-pay | Admitting: Nurse Practitioner

## 2020-09-16 ENCOUNTER — Ambulatory Visit: Payer: Medicare HMO | Admitting: Cardiology

## 2020-09-16 DIAGNOSIS — E119 Type 2 diabetes mellitus without complications: Secondary | ICD-10-CM

## 2020-09-16 MED ORDER — TECHLITE PEN NEEDLES 32G X 4 MM MISC
6 refills | Status: DC
  Filled 2020-09-16: qty 200, 90d supply, fill #0

## 2020-09-16 NOTE — Telephone Encounter (Signed)
Medication Refill - Medication: needles for [place]  solo start  Has the patient contacted their pharmacy? Yes.   (Agent: If no, request that the patient contact the pharmacy for the refill.) (Agent: If yes, when and what did the pharmacy advise?)  Preferred Pharmacy (with phone number or street name): [facility]  pharmacy  Agent: Please be advised that RX refills may take up to 3 business days. We ask that you follow-up with your pharmacy.

## 2020-09-16 NOTE — Telephone Encounter (Signed)
Spoke with Colin [name] and provided verbal orders. She also stated that patient needs refill of insulin pen needles sent to [facility]. [name] has agreed to send refill.

## 2020-09-16 NOTE — Telephone Encounter (Signed)
Home Health Verbal Orders - Caller/Agency: Verlon Au  with [facility] Number: [phone]  Requesting OT/PT/Skilled Nursing/Social Work/Speech Therapy: needing an order for skilled nursing care 1 time a week for 5 weeks

## 2020-09-19 DIAGNOSIS — E43 Unspecified severe protein-calorie malnutrition: Secondary | ICD-10-CM | POA: Diagnosis not present

## 2020-09-19 DIAGNOSIS — I25110 Atherosclerotic heart disease of native coronary artery with unstable angina pectoris: Secondary | ICD-10-CM | POA: Diagnosis not present

## 2020-09-19 DIAGNOSIS — I5020 Unspecified systolic (congestive) heart failure: Secondary | ICD-10-CM | POA: Diagnosis not present

## 2020-09-19 DIAGNOSIS — Z48812 Encounter for surgical aftercare following surgery on the circulatory system: Secondary | ICD-10-CM | POA: Diagnosis not present

## 2020-09-19 DIAGNOSIS — E1169 Type 2 diabetes mellitus with other specified complication: Secondary | ICD-10-CM | POA: Diagnosis not present

## 2020-09-19 DIAGNOSIS — I214 Non-ST elevation (NSTEMI) myocardial infarction: Secondary | ICD-10-CM | POA: Diagnosis not present

## 2020-09-19 DIAGNOSIS — I2584 Coronary atherosclerosis due to calcified coronary lesion: Secondary | ICD-10-CM | POA: Diagnosis not present

## 2020-09-19 DIAGNOSIS — I110 Hypertensive heart disease with heart failure: Secondary | ICD-10-CM | POA: Diagnosis not present

## 2020-09-19 DIAGNOSIS — E782 Mixed hyperlipidemia: Secondary | ICD-10-CM | POA: Diagnosis not present

## 2020-09-22 DIAGNOSIS — Z48812 Encounter for surgical aftercare following surgery on the circulatory system: Secondary | ICD-10-CM | POA: Diagnosis not present

## 2020-09-22 DIAGNOSIS — E43 Unspecified severe protein-calorie malnutrition: Secondary | ICD-10-CM | POA: Diagnosis not present

## 2020-09-22 DIAGNOSIS — I110 Hypertensive heart disease with heart failure: Secondary | ICD-10-CM | POA: Diagnosis not present

## 2020-09-22 DIAGNOSIS — I5020 Unspecified systolic (congestive) heart failure: Secondary | ICD-10-CM | POA: Diagnosis not present

## 2020-09-22 DIAGNOSIS — I25110 Atherosclerotic heart disease of native coronary artery with unstable angina pectoris: Secondary | ICD-10-CM | POA: Diagnosis not present

## 2020-09-22 DIAGNOSIS — I214 Non-ST elevation (NSTEMI) myocardial infarction: Secondary | ICD-10-CM | POA: Diagnosis not present

## 2020-09-22 DIAGNOSIS — E782 Mixed hyperlipidemia: Secondary | ICD-10-CM | POA: Diagnosis not present

## 2020-09-22 DIAGNOSIS — E1169 Type 2 diabetes mellitus with other specified complication: Secondary | ICD-10-CM | POA: Diagnosis not present

## 2020-09-22 DIAGNOSIS — I2584 Coronary atherosclerosis due to calcified coronary lesion: Secondary | ICD-10-CM | POA: Diagnosis not present

## 2020-09-23 ENCOUNTER — Other Ambulatory Visit: Payer: Self-pay

## 2020-09-23 DIAGNOSIS — I25110 Atherosclerotic heart disease of native coronary artery with unstable angina pectoris: Secondary | ICD-10-CM | POA: Diagnosis not present

## 2020-09-23 DIAGNOSIS — E1169 Type 2 diabetes mellitus with other specified complication: Secondary | ICD-10-CM | POA: Diagnosis not present

## 2020-09-23 DIAGNOSIS — I2584 Coronary atherosclerosis due to calcified coronary lesion: Secondary | ICD-10-CM | POA: Diagnosis not present

## 2020-09-23 DIAGNOSIS — E43 Unspecified severe protein-calorie malnutrition: Secondary | ICD-10-CM | POA: Diagnosis not present

## 2020-09-23 DIAGNOSIS — I5020 Unspecified systolic (congestive) heart failure: Secondary | ICD-10-CM | POA: Diagnosis not present

## 2020-09-23 DIAGNOSIS — E782 Mixed hyperlipidemia: Secondary | ICD-10-CM | POA: Diagnosis not present

## 2020-09-23 DIAGNOSIS — I214 Non-ST elevation (NSTEMI) myocardial infarction: Secondary | ICD-10-CM | POA: Diagnosis not present

## 2020-09-23 DIAGNOSIS — Z48812 Encounter for surgical aftercare following surgery on the circulatory system: Secondary | ICD-10-CM | POA: Diagnosis not present

## 2020-09-23 DIAGNOSIS — I110 Hypertensive heart disease with heart failure: Secondary | ICD-10-CM | POA: Diagnosis not present

## 2020-09-26 DIAGNOSIS — E1169 Type 2 diabetes mellitus with other specified complication: Secondary | ICD-10-CM | POA: Diagnosis not present

## 2020-09-26 DIAGNOSIS — Z48812 Encounter for surgical aftercare following surgery on the circulatory system: Secondary | ICD-10-CM | POA: Diagnosis not present

## 2020-09-26 DIAGNOSIS — E782 Mixed hyperlipidemia: Secondary | ICD-10-CM | POA: Diagnosis not present

## 2020-09-26 DIAGNOSIS — I2584 Coronary atherosclerosis due to calcified coronary lesion: Secondary | ICD-10-CM | POA: Diagnosis not present

## 2020-09-26 DIAGNOSIS — I110 Hypertensive heart disease with heart failure: Secondary | ICD-10-CM | POA: Diagnosis not present

## 2020-09-26 DIAGNOSIS — I5020 Unspecified systolic (congestive) heart failure: Secondary | ICD-10-CM | POA: Diagnosis not present

## 2020-09-26 DIAGNOSIS — I25110 Atherosclerotic heart disease of native coronary artery with unstable angina pectoris: Secondary | ICD-10-CM | POA: Diagnosis not present

## 2020-09-26 DIAGNOSIS — E43 Unspecified severe protein-calorie malnutrition: Secondary | ICD-10-CM | POA: Diagnosis not present

## 2020-09-26 DIAGNOSIS — I214 Non-ST elevation (NSTEMI) myocardial infarction: Secondary | ICD-10-CM | POA: Diagnosis not present

## 2020-09-27 ENCOUNTER — Other Ambulatory Visit: Payer: Self-pay

## 2020-09-27 DIAGNOSIS — I25110 Atherosclerotic heart disease of native coronary artery with unstable angina pectoris: Secondary | ICD-10-CM | POA: Diagnosis not present

## 2020-09-27 DIAGNOSIS — I110 Hypertensive heart disease with heart failure: Secondary | ICD-10-CM | POA: Diagnosis not present

## 2020-09-27 DIAGNOSIS — I214 Non-ST elevation (NSTEMI) myocardial infarction: Secondary | ICD-10-CM | POA: Diagnosis not present

## 2020-09-27 DIAGNOSIS — E1169 Type 2 diabetes mellitus with other specified complication: Secondary | ICD-10-CM | POA: Diagnosis not present

## 2020-09-27 DIAGNOSIS — I5020 Unspecified systolic (congestive) heart failure: Secondary | ICD-10-CM | POA: Diagnosis not present

## 2020-09-27 DIAGNOSIS — E43 Unspecified severe protein-calorie malnutrition: Secondary | ICD-10-CM | POA: Diagnosis not present

## 2020-09-27 DIAGNOSIS — I2584 Coronary atherosclerosis due to calcified coronary lesion: Secondary | ICD-10-CM | POA: Diagnosis not present

## 2020-09-27 DIAGNOSIS — E782 Mixed hyperlipidemia: Secondary | ICD-10-CM | POA: Diagnosis not present

## 2020-09-27 DIAGNOSIS — Z48812 Encounter for surgical aftercare following surgery on the circulatory system: Secondary | ICD-10-CM | POA: Diagnosis not present

## 2020-09-29 ENCOUNTER — Other Ambulatory Visit: Payer: Self-pay | Admitting: Thoracic Surgery (Cardiothoracic Vascular Surgery)

## 2020-09-29 DIAGNOSIS — Z951 Presence of aortocoronary bypass graft: Secondary | ICD-10-CM

## 2020-09-30 ENCOUNTER — Encounter: Payer: Self-pay | Admitting: Thoracic Surgery (Cardiothoracic Vascular Surgery)

## 2020-10-03 DIAGNOSIS — I214 Non-ST elevation (NSTEMI) myocardial infarction: Secondary | ICD-10-CM | POA: Diagnosis not present

## 2020-10-03 DIAGNOSIS — E782 Mixed hyperlipidemia: Secondary | ICD-10-CM | POA: Diagnosis not present

## 2020-10-03 DIAGNOSIS — I2584 Coronary atherosclerosis due to calcified coronary lesion: Secondary | ICD-10-CM | POA: Diagnosis not present

## 2020-10-03 DIAGNOSIS — E1169 Type 2 diabetes mellitus with other specified complication: Secondary | ICD-10-CM | POA: Diagnosis not present

## 2020-10-03 DIAGNOSIS — I25110 Atherosclerotic heart disease of native coronary artery with unstable angina pectoris: Secondary | ICD-10-CM | POA: Diagnosis not present

## 2020-10-03 DIAGNOSIS — E43 Unspecified severe protein-calorie malnutrition: Secondary | ICD-10-CM | POA: Diagnosis not present

## 2020-10-03 DIAGNOSIS — Z48812 Encounter for surgical aftercare following surgery on the circulatory system: Secondary | ICD-10-CM | POA: Diagnosis not present

## 2020-10-03 DIAGNOSIS — I5020 Unspecified systolic (congestive) heart failure: Secondary | ICD-10-CM | POA: Diagnosis not present

## 2020-10-03 DIAGNOSIS — I110 Hypertensive heart disease with heart failure: Secondary | ICD-10-CM | POA: Diagnosis not present

## 2020-10-04 ENCOUNTER — Other Ambulatory Visit: Payer: Self-pay

## 2020-10-04 DIAGNOSIS — I110 Hypertensive heart disease with heart failure: Secondary | ICD-10-CM | POA: Diagnosis not present

## 2020-10-04 DIAGNOSIS — E1169 Type 2 diabetes mellitus with other specified complication: Secondary | ICD-10-CM | POA: Diagnosis not present

## 2020-10-04 DIAGNOSIS — I214 Non-ST elevation (NSTEMI) myocardial infarction: Secondary | ICD-10-CM | POA: Diagnosis not present

## 2020-10-04 DIAGNOSIS — I2584 Coronary atherosclerosis due to calcified coronary lesion: Secondary | ICD-10-CM | POA: Diagnosis not present

## 2020-10-04 DIAGNOSIS — I5020 Unspecified systolic (congestive) heart failure: Secondary | ICD-10-CM | POA: Diagnosis not present

## 2020-10-04 DIAGNOSIS — Z48812 Encounter for surgical aftercare following surgery on the circulatory system: Secondary | ICD-10-CM | POA: Diagnosis not present

## 2020-10-04 DIAGNOSIS — E782 Mixed hyperlipidemia: Secondary | ICD-10-CM | POA: Diagnosis not present

## 2020-10-04 DIAGNOSIS — E43 Unspecified severe protein-calorie malnutrition: Secondary | ICD-10-CM | POA: Diagnosis not present

## 2020-10-04 DIAGNOSIS — I25110 Atherosclerotic heart disease of native coronary artery with unstable angina pectoris: Secondary | ICD-10-CM | POA: Diagnosis not present

## 2020-10-05 ENCOUNTER — Other Ambulatory Visit: Payer: Self-pay

## 2020-10-06 DIAGNOSIS — I2584 Coronary atherosclerosis due to calcified coronary lesion: Secondary | ICD-10-CM | POA: Diagnosis not present

## 2020-10-06 DIAGNOSIS — I214 Non-ST elevation (NSTEMI) myocardial infarction: Secondary | ICD-10-CM | POA: Diagnosis not present

## 2020-10-06 DIAGNOSIS — E43 Unspecified severe protein-calorie malnutrition: Secondary | ICD-10-CM | POA: Diagnosis not present

## 2020-10-06 DIAGNOSIS — I5020 Unspecified systolic (congestive) heart failure: Secondary | ICD-10-CM | POA: Diagnosis not present

## 2020-10-06 DIAGNOSIS — E1169 Type 2 diabetes mellitus with other specified complication: Secondary | ICD-10-CM | POA: Diagnosis not present

## 2020-10-06 DIAGNOSIS — I110 Hypertensive heart disease with heart failure: Secondary | ICD-10-CM | POA: Diagnosis not present

## 2020-10-06 DIAGNOSIS — E782 Mixed hyperlipidemia: Secondary | ICD-10-CM | POA: Diagnosis not present

## 2020-10-06 DIAGNOSIS — Z48812 Encounter for surgical aftercare following surgery on the circulatory system: Secondary | ICD-10-CM | POA: Diagnosis not present

## 2020-10-06 DIAGNOSIS — I25110 Atherosclerotic heart disease of native coronary artery with unstable angina pectoris: Secondary | ICD-10-CM | POA: Diagnosis not present

## 2020-10-11 DIAGNOSIS — E782 Mixed hyperlipidemia: Secondary | ICD-10-CM | POA: Diagnosis not present

## 2020-10-11 DIAGNOSIS — E1169 Type 2 diabetes mellitus with other specified complication: Secondary | ICD-10-CM | POA: Diagnosis not present

## 2020-10-11 DIAGNOSIS — I5020 Unspecified systolic (congestive) heart failure: Secondary | ICD-10-CM | POA: Diagnosis not present

## 2020-10-11 DIAGNOSIS — I2584 Coronary atherosclerosis due to calcified coronary lesion: Secondary | ICD-10-CM | POA: Diagnosis not present

## 2020-10-11 DIAGNOSIS — I25110 Atherosclerotic heart disease of native coronary artery with unstable angina pectoris: Secondary | ICD-10-CM | POA: Diagnosis not present

## 2020-10-11 DIAGNOSIS — E43 Unspecified severe protein-calorie malnutrition: Secondary | ICD-10-CM | POA: Diagnosis not present

## 2020-10-11 DIAGNOSIS — Z48812 Encounter for surgical aftercare following surgery on the circulatory system: Secondary | ICD-10-CM | POA: Diagnosis not present

## 2020-10-11 DIAGNOSIS — I214 Non-ST elevation (NSTEMI) myocardial infarction: Secondary | ICD-10-CM | POA: Diagnosis not present

## 2020-10-11 DIAGNOSIS — I110 Hypertensive heart disease with heart failure: Secondary | ICD-10-CM | POA: Diagnosis not present

## 2020-10-12 ENCOUNTER — Other Ambulatory Visit: Payer: Self-pay

## 2020-10-13 DIAGNOSIS — Z48812 Encounter for surgical aftercare following surgery on the circulatory system: Secondary | ICD-10-CM | POA: Diagnosis not present

## 2020-10-13 DIAGNOSIS — I25110 Atherosclerotic heart disease of native coronary artery with unstable angina pectoris: Secondary | ICD-10-CM | POA: Diagnosis not present

## 2020-10-13 DIAGNOSIS — E43 Unspecified severe protein-calorie malnutrition: Secondary | ICD-10-CM | POA: Diagnosis not present

## 2020-10-13 DIAGNOSIS — E782 Mixed hyperlipidemia: Secondary | ICD-10-CM | POA: Diagnosis not present

## 2020-10-13 DIAGNOSIS — I214 Non-ST elevation (NSTEMI) myocardial infarction: Secondary | ICD-10-CM | POA: Diagnosis not present

## 2020-10-13 DIAGNOSIS — I110 Hypertensive heart disease with heart failure: Secondary | ICD-10-CM | POA: Diagnosis not present

## 2020-10-13 DIAGNOSIS — I5020 Unspecified systolic (congestive) heart failure: Secondary | ICD-10-CM | POA: Diagnosis not present

## 2020-10-13 DIAGNOSIS — I2584 Coronary atherosclerosis due to calcified coronary lesion: Secondary | ICD-10-CM | POA: Diagnosis not present

## 2020-10-13 DIAGNOSIS — E1169 Type 2 diabetes mellitus with other specified complication: Secondary | ICD-10-CM | POA: Diagnosis not present

## 2020-10-14 ENCOUNTER — Ambulatory Visit: Payer: Self-pay | Admitting: Thoracic Surgery (Cardiothoracic Vascular Surgery)

## 2020-10-14 ENCOUNTER — Other Ambulatory Visit: Payer: Self-pay | Admitting: Physician Assistant

## 2020-10-14 ENCOUNTER — Other Ambulatory Visit: Payer: Self-pay

## 2020-10-14 ENCOUNTER — Ambulatory Visit (INDEPENDENT_AMBULATORY_CARE_PROVIDER_SITE_OTHER): Payer: Self-pay | Admitting: Physician Assistant

## 2020-10-14 ENCOUNTER — Ambulatory Visit
Admission: RE | Admit: 2020-10-14 | Discharge: 2020-10-14 | Disposition: A | Discharge status: Home / Self Care | Payer: Medicare HMO | Source: Ambulatory Visit | Attending: Thoracic Surgery (Cardiothoracic Vascular Surgery) | Admitting: Thoracic Surgery (Cardiothoracic Vascular Surgery)

## 2020-10-14 VITALS — BP 118/75 | HR 81 | Resp 20 | Ht 64.0 in | Wt 125.0 lb

## 2020-10-14 DIAGNOSIS — I700 Atherosclerosis of aorta: Secondary | ICD-10-CM | POA: Diagnosis not present

## 2020-10-14 DIAGNOSIS — E119 Type 2 diabetes mellitus without complications: Secondary | ICD-10-CM

## 2020-10-14 DIAGNOSIS — Z951 Presence of aortocoronary bypass graft: Secondary | ICD-10-CM | POA: Diagnosis not present

## 2020-10-14 MED ORDER — ACCU-CHEK GUIDE VI STRP
1.0000 | ORAL_STRIP | Freq: Two times a day (BID) | 6 refills | Status: DC
  Filled 2020-10-14: qty 100, 50d supply, fill #0

## 2020-10-14 MED ORDER — TECHLITE PEN NEEDLES 32G X 4 MM MISC
6 refills | Status: DC
  Filled 2020-10-14: qty 200, 90d supply, fill #0
  Filled 2021-01-18: qty 200, 90d supply, fill #1
  Filled 2021-06-19: qty 200, 90d supply, fill #0

## 2020-10-14 MED ORDER — ACCU-CHEK SOFTCLIX LANCETS MISC
6 refills | Status: DC
  Filled 2020-10-14: qty 100, 33d supply, fill #0

## 2020-10-14 MED ORDER — ACCU-CHEK GUIDE W/DEVICE KIT
PACK | 0 refills | Status: DC
  Filled 2020-10-14: qty 1, 30d supply, fill #0

## 2020-10-14 NOTE — Progress Notes (Signed)
[address]       [place] [address]             Colin [name] is a 70 y.o. male patient s/p CABG x 3 on 08/23/2020 with Dr. Kipp Brood. He did not have any significant complications while in the hospital. He returns today for his in-person routine follow-up appointment about a month after surgery.    1. S/P CABG x 3    Past Medical History:  Diagnosis Date   Arrhythmia    CAD (coronary artery disease)    stent x 2   Carotid artery occlusion    Diabetes mellitus    Dyslipidemia    Essential hypertension    No past surgical history pertinent negatives on file. Scheduled Meds: Current Outpatient Medications on File Prior to Visit  Medication Sig Dispense Refill   Accu-Chek Softclix Lancets lancets Use to check blood sugar three times daily. 100 each 6   aspirin EC 81 MG tablet Take 1 tablet (81 mg total) by mouth daily. Swallow whole. 50 tablet 2   bisoprolol (ZEBETA) 5 MG tablet Take 0.5 tablets (2.5 mg total) by mouth daily. 30 tablet 1   Blood Glucose Monitoring Suppl (ACCU-CHEK GUIDE) w/Device KIT USE AS DIRECTED 1 kit 0   clopidogrel (PLAVIX) 75 MG tablet Take 1 tablet (75 mg total) by mouth daily. 30 tablet 1   dapagliflozin propanediol (FARXIGA) 10 MG TABS tablet Take 1 tablet (10 mg total) by mouth daily. 30 tablet 1   gabapentin (NEURONTIN) 100 MG capsule TAKE 1 CAPSULE (100 MG TOTAL) BY MOUTH 3 (THREE) TIMES DAILY. 270 capsule 1   glucose blood (ACCU-CHEK GUIDE) test strip 1 each by Other route 2 (two) times daily. And lancets 2/day 100 each 6   insulin glargine (LANTUS) 100 UNIT/ML Solostar Pen Inject 20 Units into the skin every morning. 72 mL 0   Insulin Pen Needle (TECHLITE PEN NEEDLES) 32G X 4 MM MISC Use as instructed to inject Lantus and Victoza once daily. 200 each 6   ivabradine (CORLANOR) 5 MG TABS tablet Take 1 tablet (5 mg total) by mouth 2 (two) times daily with a meal. 60 tablet 0   midodrine (PROAMATINE) 2.5 MG  tablet Take 1 tablet (2.5 mg total) by mouth 3 (three) times daily with meals. 90 tablet 1   oxyCODONE (OXY IR/ROXICODONE) 5 MG immediate release tablet Take 1 tablet (5 mg total) by mouth every 6 (six) hours as needed for severe pain. 30 tablet 0   rosuvastatin (CRESTOR) 40 MG tablet Take 1 tablet (40 mg total) by mouth at bedtime. 30 tablet 1   spironolactone (ALDACTONE) 25 MG tablet Take 0.5 tablets (12.5 mg total) by mouth daily. 30 tablet 1   No current facility-administered medications on file prior to visit.     No Known Allergies Active Problems:   * No active hospital problems. *  Cor: RRR, no murmur Pulm: CTA bilaterally and in all fields Abd: no tenderness Wound: healing well, tiny knot removed from lower portion of incision Ext: no edema   CLINICAL DATA:  70 year old male with history of CABG.   EXAM: CHEST - 2 VIEW   COMPARISON:  Chest x-ray 08/26/2020.   FINDINGS: Lung volumes are normal. No consolidative airspace disease. No pleural effusions. No pneumothorax. No pulmonary nodule or mass noted. Pulmonary vasculature and the cardiomediastinal silhouette are within normal limits. Atherosclerosis in the thoracic  aorta. Status post median sternotomy for CABG.   IMPRESSION: 1. No radiographic evidence of acute cardiopulmonary disease. 2. Aortic atherosclerosis.     Electronically Signed   By: Vinnie Langton M.D.   On: 10/14/2020 12:21   Subjective Objective: Vital signs (most recent): Blood pressure 118/75, pulse 81, resp. rate 20, height 5' 4"  (1.626 m), weight 125 lb (56.7 kg), SpO2 96 %. Assessment & Plan  Colin [name] is doing well s/p CABG x 3. We had his son on the phone just in case there were any translation needs. He does very well with [place]. He has been exercising at home walking for 10 minutes a day. He has been eating a well balanced diet. He drove himself to his appointment today, but he is still requiring narcotic pain medication in the  morning. I encouraged him not to drive while taking narcotics. We discussed weaning to only Tylenol during the day and narcotics at night to help with sleep. His blood sugar and weight have been stable. It sounds like he has a lot of family support with a few of his sons living locally. CXR results were discussed with the patient and there were no concerns.   Plan: Overall, he looks good. I encouraged him to continue to follow-up with his Cardiologist. He does not need any scheduled follow-up with [place]. He is encouraged to call the office if he has questions or concerns.   Elgie Collard 10/14/2020

## 2020-10-17 ENCOUNTER — Other Ambulatory Visit: Payer: Self-pay

## 2020-10-17 ENCOUNTER — Encounter: Payer: Self-pay | Admitting: Physician Assistant

## 2020-10-18 ENCOUNTER — Telehealth (INDEPENDENT_AMBULATORY_CARE_PROVIDER_SITE_OTHER): Payer: Self-pay | Admitting: Primary Care

## 2020-10-18 ENCOUNTER — Other Ambulatory Visit: Payer: Self-pay

## 2020-10-18 DIAGNOSIS — E1169 Type 2 diabetes mellitus with other specified complication: Secondary | ICD-10-CM | POA: Diagnosis not present

## 2020-10-18 DIAGNOSIS — I214 Non-ST elevation (NSTEMI) myocardial infarction: Secondary | ICD-10-CM | POA: Diagnosis not present

## 2020-10-18 DIAGNOSIS — I110 Hypertensive heart disease with heart failure: Secondary | ICD-10-CM | POA: Diagnosis not present

## 2020-10-18 DIAGNOSIS — Z48812 Encounter for surgical aftercare following surgery on the circulatory system: Secondary | ICD-10-CM | POA: Diagnosis not present

## 2020-10-18 DIAGNOSIS — I2584 Coronary atherosclerosis due to calcified coronary lesion: Secondary | ICD-10-CM | POA: Diagnosis not present

## 2020-10-18 DIAGNOSIS — E43 Unspecified severe protein-calorie malnutrition: Secondary | ICD-10-CM | POA: Diagnosis not present

## 2020-10-18 DIAGNOSIS — E782 Mixed hyperlipidemia: Secondary | ICD-10-CM | POA: Diagnosis not present

## 2020-10-18 DIAGNOSIS — I25110 Atherosclerotic heart disease of native coronary artery with unstable angina pectoris: Secondary | ICD-10-CM | POA: Diagnosis not present

## 2020-10-18 DIAGNOSIS — I5020 Unspecified systolic (congestive) heart failure: Secondary | ICD-10-CM | POA: Diagnosis not present

## 2020-10-18 NOTE — Telephone Encounter (Signed)
Routed to PCP 

## 2020-10-18 NOTE — Telephone Encounter (Signed)
Medication Refill - Medication: Verlon Au from [facility] called to request half doses for the patients medications. He is completely out of his current supply, instead of taking half tablets she is requesting that he takes whole tablets of half doses. (Easier for the patient's understanding, please advise)  Best contact: [phone]  bisoprolol (ZEBETA) 2.5 MG tablet spironolactone (ALDACTONE) 12.5 MG tablet  Has the patient contacted their pharmacy?  (Agent: If no, request that the patient contact the pharmacy for the refill.) (Agent: If yes, when and what did the pharmacy advise?)  Preferred Pharmacy (with phone number or street name):  [facility]  [address] [place] [address]  Phone: [phone] Fax: [phone]    Agent: Please be advised that RX refills may take up to 3 business days. We ask that you follow-up with your pharmacy.

## 2020-10-19 DIAGNOSIS — I110 Hypertensive heart disease with heart failure: Secondary | ICD-10-CM | POA: Diagnosis not present

## 2020-10-19 DIAGNOSIS — I214 Non-ST elevation (NSTEMI) myocardial infarction: Secondary | ICD-10-CM | POA: Diagnosis not present

## 2020-10-19 DIAGNOSIS — Z48812 Encounter for surgical aftercare following surgery on the circulatory system: Secondary | ICD-10-CM | POA: Diagnosis not present

## 2020-10-19 DIAGNOSIS — I2584 Coronary atherosclerosis due to calcified coronary lesion: Secondary | ICD-10-CM | POA: Diagnosis not present

## 2020-10-19 DIAGNOSIS — I25110 Atherosclerotic heart disease of native coronary artery with unstable angina pectoris: Secondary | ICD-10-CM | POA: Diagnosis not present

## 2020-10-19 DIAGNOSIS — E43 Unspecified severe protein-calorie malnutrition: Secondary | ICD-10-CM | POA: Diagnosis not present

## 2020-10-19 DIAGNOSIS — I5020 Unspecified systolic (congestive) heart failure: Secondary | ICD-10-CM | POA: Diagnosis not present

## 2020-10-19 DIAGNOSIS — E782 Mixed hyperlipidemia: Secondary | ICD-10-CM | POA: Diagnosis not present

## 2020-10-19 DIAGNOSIS — E1169 Type 2 diabetes mellitus with other specified complication: Secondary | ICD-10-CM | POA: Diagnosis not present

## 2020-10-19 NOTE — Telephone Encounter (Signed)
We have discharged this patient from the practice so we are no longer following him. Patwardhan, Anabel Bene, MD  is his Cardiologist. I would contact him.   Thanks,   Jari Favre, PA-C

## 2020-10-20 NOTE — Telephone Encounter (Signed)
Only saw the patient in the hospital. He did not show for follow up, was supposed to see my partner Dr. Odis Hollingshead. Staff, please arrange outpatient follow up with [place].  Thanks MJP

## 2020-10-21 ENCOUNTER — Ambulatory Visit: Payer: Medicare HMO | Admitting: Endocrinology

## 2020-10-24 ENCOUNTER — Ambulatory Visit (INDEPENDENT_AMBULATORY_CARE_PROVIDER_SITE_OTHER): Payer: Medicare HMO | Admitting: Endocrinology

## 2020-10-24 ENCOUNTER — Other Ambulatory Visit: Payer: Self-pay

## 2020-10-24 VITALS — BP 160/90 | HR 83 | Ht 64.0 in | Wt 127.0 lb

## 2020-10-24 DIAGNOSIS — E119 Type 2 diabetes mellitus without complications: Secondary | ICD-10-CM

## 2020-10-24 LAB — POCT GLYCOSYLATED HEMOGLOBIN (HGB A1C): Hemoglobin A1C: 8.4 % — AB (ref 4.0–5.6)

## 2020-10-24 MED ORDER — INSULIN GLARGINE 100 UNIT/ML SOLOSTAR PEN
32.0000 [IU] | PEN_INJECTOR | Freq: Every morning | SUBCUTANEOUS | 3 refills | Status: DC
  Filled 2020-10-24: qty 45, 140d supply, fill #0

## 2020-10-24 NOTE — Patient Instructions (Addendum)
check your blood sugar twice a day.  vary the time of day when you check, between before the 3 meals, and at bedtime.  also check if you have symptoms of your blood sugar being too high or too low.  please keep a record of the readings and bring it to your next appointment here (or you can bring the meter itself).  You can write it on any piece of paper.  please call us sooner if your blood sugar goes below 70, or if most of your readings are over 200. Please change the lantus to 32 units each morning.  Please come back for a follow-up appointment in 2 months.

## 2020-10-24 NOTE — Progress Notes (Signed)
Subjective:    Patient ID: Colin [name], male    DOB: [birth year], 70 y.o.   MRN: [id number]  HPI Pt returns for f/u of diabetes mellitus: DM type: Insulin-requiring type 2 Dx'ed: 4098 Complications: PN, PAD, and CAD Therapy: insulin since 2021, and farxiga.   DKA: never Severe hypoglycemia: never.   Pancreatitis: never Pancreatic imaging: normal on 2012 CT SDOH: he gets meds from [facility] Other: due to noncompliance, he is not a candidate for multiple daily injections.   Interval history: Pt says he has been able to obtain Lantus--he takes in the evening.  no cbg record, but states cbg's vary from 68-300.  It is in general higher as the day goes on.  He had CABG 2 mos ago.   Past Medical History:  Diagnosis Date   Arrhythmia    CAD (coronary artery disease)    stent x 2   Carotid artery occlusion    Diabetes mellitus    Dyslipidemia    Essential hypertension     Past Surgical History:  Procedure Laterality Date   CARDIAC CATHETERIZATION     01/2010 ; 04/2010   CORONARY ARTERY BYPASS GRAFT N/A 08/23/2020   Procedure: CORONARY ARTERY BYPASS GRAFTING (CABG) x 3 ON CARDIOPULMONARY BYPASS USING EVH AND PARTIALLY OPEN HARVESTED LEFT GREATER SAPHENOUS VEIN AND LEFT INTERNAL MAMMARY ARTERY.  LIMA TO LAD, SVG TO OM, SVG TO PDA;  Surgeon: Lajuana Matte, MD;  Location: [place];  Service: Open Heart Surgery;  Laterality: N/A;   RIGHT/LEFT HEART CATH AND CORONARY ANGIOGRAPHY N/A 08/22/2020   Procedure: RIGHT/LEFT HEART CATH AND CORONARY ANGIOGRAPHY;  Surgeon: Nigel Mormon, MD;  Location: [place] [facility];  Service: Cardiovascular;  Laterality: N/A;   TEE WITHOUT CARDIOVERSION N/A 08/23/2020   Procedure: TRANSESOPHAGEAL ECHOCARDIOGRAM (TEE);  Surgeon: Lajuana Matte, MD;  Location: [place];  Service: Open Heart Surgery;  Laterality: N/A;   TEMPORARY PACEMAKER N/A 08/22/2020   Procedure: TEMPORARY PACEMAKER;  Surgeon: Nigel Mormon, MD;  Location: [place] [facility];   Service: Cardiovascular;  Laterality: N/A;   VEIN HARVEST Left 08/23/2020   Procedure: ENDOVEIN HARVEST AND PARTIALLY OPEN GREATER SAPHENOUS VEIN HARVESTING;  Surgeon: Lajuana Matte, MD;  Location: [place];  Service: Open Heart Surgery;  Laterality: Left;    Social History   Socioeconomic History   Marital status: Married    Spouse name: Not on file   Number of children: Not on file   Years of education: Not on file   Highest education level: Not on file  Occupational History   Not on file  Tobacco Use   Smoking status: Every Day    Packs/day: 0.25    Types: Cigarettes   Smokeless tobacco: Never  Vaping Use   Vaping Use: Never used  Substance and Sexual Activity   Alcohol use: No   Drug use: No   Sexual activity: Not on file  Other Topics Concern   Not on file  Social History Narrative   Not on file   Social Determinants of Health   Financial Resource Strain: Not on file  Food Insecurity: Not on file  Transportation Needs: Not on file  Physical Activity: Not on file  Stress: Not on file  Social Connections: Not on file  Intimate Partner Violence: Not on file    Current Outpatient Medications on File Prior to Visit  Medication Sig Dispense Refill   Accu-Chek Softclix Lancets lancets Use to check blood sugar three times daily.  100 each 6   aspirin EC 81 MG tablet Take 1 tablet (81 mg total) by mouth daily. Swallow whole. 50 tablet 2   bisoprolol (ZEBETA) 5 MG tablet Take 0.5 tablets (2.5 mg total) by mouth daily. 30 tablet 1   Blood Glucose Monitoring Suppl (ACCU-CHEK GUIDE) w/Device KIT USE AS DIRECTED 1 kit 0   clopidogrel (PLAVIX) 75 MG tablet Take 1 tablet (75 mg total) by mouth daily. 30 tablet 1   dapagliflozin propanediol (FARXIGA) 10 MG TABS tablet Take 1 tablet (10 mg total) by mouth daily. 30 tablet 1   gabapentin (NEURONTIN) 100 MG capsule TAKE 1 CAPSULE (100 MG TOTAL) BY MOUTH 3 (THREE) TIMES DAILY. 270 capsule 1   glucose blood (ACCU-CHEK GUIDE) test  strip Use twice daily. And lancets 2/day 100 each 6   Insulin Pen Needle (TECHLITE PEN NEEDLES) 32G X 4 MM MISC Use as instructed to inject Lantus and Victoza once daily. 200 each 6   ivabradine (CORLANOR) 5 MG TABS tablet Take 1 tablet (5 mg total) by mouth 2 (two) times daily with a meal. 60 tablet 0   midodrine (PROAMATINE) 2.5 MG tablet Take 1 tablet (2.5 mg total) by mouth 3 (three) times daily with meals. 90 tablet 1   oxyCODONE (OXY IR/ROXICODONE) 5 MG immediate release tablet Take 1 tablet (5 mg total) by mouth every 6 (six) hours as needed for severe pain. 30 tablet 0   rosuvastatin (CRESTOR) 40 MG tablet Take 1 tablet (40 mg total) by mouth at bedtime. 30 tablet 1   spironolactone (ALDACTONE) 25 MG tablet Take 0.5 tablets (12.5 mg total) by mouth daily. 30 tablet 1   No current facility-administered medications on file prior to visit.    No Known Allergies  Family History  Problem Relation Age of Onset   Diabetes Neg Hx     BP (!) 160/90 (BP Location: Right Arm, Patient Position: Sitting, Cuff Size: Normal)   Pulse 83   Ht 5' 4"  (1.626 m)   Wt 127 lb (57.6 kg)   SpO2 97%   BMI 21.80 kg/m    Review of Systems     Objective:   Physical Exam Pulses: dorsalis pedis intact bilat.   MSK: no deformity of the feet.   CV: trace bilat leg edema.   Skin:  no ulcer on the feet.  normal color and temp on the feet.   Neuro: sensation is intact to touch on the feet, but decreased from normal.     Lab Results  Component Value Date   HGBA1C 8.4 (A) 10/24/2020      Assessment & Plan:  Insulin-requiring type 2 DM: uncontrolled.  Hypoglycemia, due to insulin: he needs to change the Lantus to qam.   Patient Instructions  check your blood sugar twice a day.  vary the time of day when you check, between before the 3 meals, and at bedtime.  also check if you have symptoms of your blood sugar being too high or too low.  please keep a record of the readings and bring it to your next  appointment here (or you can bring the meter itself).  You can write it on any piece of paper.  please call us sooner if your blood sugar goes below 70, or if most of your readings are over 200. Please change the lantus to 32 units each morning.  Please come back for a follow-up appointment in 2 months.

## 2020-10-26 DIAGNOSIS — I25110 Atherosclerotic heart disease of native coronary artery with unstable angina pectoris: Secondary | ICD-10-CM | POA: Diagnosis not present

## 2020-10-26 DIAGNOSIS — I214 Non-ST elevation (NSTEMI) myocardial infarction: Secondary | ICD-10-CM | POA: Diagnosis not present

## 2020-10-26 DIAGNOSIS — I2584 Coronary atherosclerosis due to calcified coronary lesion: Secondary | ICD-10-CM | POA: Diagnosis not present

## 2020-10-26 DIAGNOSIS — E782 Mixed hyperlipidemia: Secondary | ICD-10-CM | POA: Diagnosis not present

## 2020-10-26 DIAGNOSIS — E43 Unspecified severe protein-calorie malnutrition: Secondary | ICD-10-CM | POA: Diagnosis not present

## 2020-10-26 DIAGNOSIS — Z48812 Encounter for surgical aftercare following surgery on the circulatory system: Secondary | ICD-10-CM | POA: Diagnosis not present

## 2020-10-26 DIAGNOSIS — I5020 Unspecified systolic (congestive) heart failure: Secondary | ICD-10-CM | POA: Diagnosis not present

## 2020-10-26 DIAGNOSIS — E1169 Type 2 diabetes mellitus with other specified complication: Secondary | ICD-10-CM | POA: Diagnosis not present

## 2020-10-26 DIAGNOSIS — I110 Hypertensive heart disease with heart failure: Secondary | ICD-10-CM | POA: Diagnosis not present

## 2020-10-28 DIAGNOSIS — Z48812 Encounter for surgical aftercare following surgery on the circulatory system: Secondary | ICD-10-CM | POA: Diagnosis not present

## 2020-10-28 DIAGNOSIS — I5020 Unspecified systolic (congestive) heart failure: Secondary | ICD-10-CM | POA: Diagnosis not present

## 2020-10-28 DIAGNOSIS — I110 Hypertensive heart disease with heart failure: Secondary | ICD-10-CM | POA: Diagnosis not present

## 2020-10-28 DIAGNOSIS — E1169 Type 2 diabetes mellitus with other specified complication: Secondary | ICD-10-CM | POA: Diagnosis not present

## 2020-10-28 DIAGNOSIS — E43 Unspecified severe protein-calorie malnutrition: Secondary | ICD-10-CM | POA: Diagnosis not present

## 2020-10-28 DIAGNOSIS — I25110 Atherosclerotic heart disease of native coronary artery with unstable angina pectoris: Secondary | ICD-10-CM | POA: Diagnosis not present

## 2020-10-28 DIAGNOSIS — I214 Non-ST elevation (NSTEMI) myocardial infarction: Secondary | ICD-10-CM | POA: Diagnosis not present

## 2020-10-28 DIAGNOSIS — I2584 Coronary atherosclerosis due to calcified coronary lesion: Secondary | ICD-10-CM | POA: Diagnosis not present

## 2020-10-28 DIAGNOSIS — E782 Mixed hyperlipidemia: Secondary | ICD-10-CM | POA: Diagnosis not present

## 2020-10-31 ENCOUNTER — Other Ambulatory Visit: Payer: Self-pay

## 2020-10-31 ENCOUNTER — Other Ambulatory Visit (INDEPENDENT_AMBULATORY_CARE_PROVIDER_SITE_OTHER): Payer: Self-pay | Admitting: Primary Care

## 2020-10-31 DIAGNOSIS — E1169 Type 2 diabetes mellitus with other specified complication: Secondary | ICD-10-CM | POA: Diagnosis not present

## 2020-10-31 DIAGNOSIS — I2584 Coronary atherosclerosis due to calcified coronary lesion: Secondary | ICD-10-CM | POA: Diagnosis not present

## 2020-10-31 DIAGNOSIS — Z48812 Encounter for surgical aftercare following surgery on the circulatory system: Secondary | ICD-10-CM | POA: Diagnosis not present

## 2020-10-31 DIAGNOSIS — E43 Unspecified severe protein-calorie malnutrition: Secondary | ICD-10-CM | POA: Diagnosis not present

## 2020-10-31 DIAGNOSIS — I214 Non-ST elevation (NSTEMI) myocardial infarction: Secondary | ICD-10-CM | POA: Diagnosis not present

## 2020-10-31 DIAGNOSIS — I110 Hypertensive heart disease with heart failure: Secondary | ICD-10-CM | POA: Diagnosis not present

## 2020-10-31 DIAGNOSIS — E782 Mixed hyperlipidemia: Secondary | ICD-10-CM | POA: Diagnosis not present

## 2020-10-31 DIAGNOSIS — I25110 Atherosclerotic heart disease of native coronary artery with unstable angina pectoris: Secondary | ICD-10-CM | POA: Diagnosis not present

## 2020-10-31 DIAGNOSIS — I5020 Unspecified systolic (congestive) heart failure: Secondary | ICD-10-CM | POA: Diagnosis not present

## 2020-10-31 NOTE — Telephone Encounter (Signed)
Sent to pcp

## 2020-11-01 ENCOUNTER — Telehealth (INDEPENDENT_AMBULATORY_CARE_PROVIDER_SITE_OTHER): Payer: Self-pay | Admitting: Primary Care

## 2020-11-01 NOTE — Telephone Encounter (Signed)
Erin PT with [facility] is calling and the patient has ran out of these medication rosuvastatin 40 mg, midodrine 2.5 mg, spironolactone 25 mg,farxiga 10 mg, insulin glargine, corlanor 5 mg, clopidogrel 75 mg, and bisoprolol fumarate 5 mg . [facility]. Pt missed his appt due to unable to drive. Denny Peon would like someone to call the patient to schedule an appt with michelle

## 2020-11-02 ENCOUNTER — Other Ambulatory Visit (INDEPENDENT_AMBULATORY_CARE_PROVIDER_SITE_OTHER): Payer: Self-pay | Admitting: Primary Care

## 2020-11-02 DIAGNOSIS — E0842 Diabetes mellitus due to underlying condition with diabetic polyneuropathy: Secondary | ICD-10-CM

## 2020-11-02 MED ORDER — GABAPENTIN 100 MG PO CAPS
ORAL_CAPSULE | Freq: Three times a day (TID) | ORAL | 1 refills | Status: DC
Start: 2020-11-02 — End: 2021-12-24
  Filled 2020-11-02 – 2021-02-14 (×2): qty 270, 90d supply, fill #0

## 2020-11-03 ENCOUNTER — Other Ambulatory Visit: Payer: Self-pay

## 2020-11-03 NOTE — Telephone Encounter (Signed)
Is patient to contact endocrinology for all refills?

## 2020-11-05 DIAGNOSIS — Z48812 Encounter for surgical aftercare following surgery on the circulatory system: Secondary | ICD-10-CM | POA: Diagnosis not present

## 2020-11-05 DIAGNOSIS — E43 Unspecified severe protein-calorie malnutrition: Secondary | ICD-10-CM | POA: Diagnosis not present

## 2020-11-05 DIAGNOSIS — I2584 Coronary atherosclerosis due to calcified coronary lesion: Secondary | ICD-10-CM | POA: Diagnosis not present

## 2020-11-05 DIAGNOSIS — E782 Mixed hyperlipidemia: Secondary | ICD-10-CM | POA: Diagnosis not present

## 2020-11-05 DIAGNOSIS — I5020 Unspecified systolic (congestive) heart failure: Secondary | ICD-10-CM | POA: Diagnosis not present

## 2020-11-05 DIAGNOSIS — I214 Non-ST elevation (NSTEMI) myocardial infarction: Secondary | ICD-10-CM | POA: Diagnosis not present

## 2020-11-05 DIAGNOSIS — I110 Hypertensive heart disease with heart failure: Secondary | ICD-10-CM | POA: Diagnosis not present

## 2020-11-05 DIAGNOSIS — I25110 Atherosclerotic heart disease of native coronary artery with unstable angina pectoris: Secondary | ICD-10-CM | POA: Diagnosis not present

## 2020-11-05 DIAGNOSIS — E1169 Type 2 diabetes mellitus with other specified complication: Secondary | ICD-10-CM | POA: Diagnosis not present

## 2020-11-06 NOTE — Telephone Encounter (Signed)
Has an appt on 10/7. Will address.  Thanks MJP

## 2020-11-07 ENCOUNTER — Other Ambulatory Visit: Payer: Self-pay

## 2020-11-09 DIAGNOSIS — E782 Mixed hyperlipidemia: Secondary | ICD-10-CM | POA: Diagnosis not present

## 2020-11-09 DIAGNOSIS — E43 Unspecified severe protein-calorie malnutrition: Secondary | ICD-10-CM | POA: Diagnosis not present

## 2020-11-09 DIAGNOSIS — I5020 Unspecified systolic (congestive) heart failure: Secondary | ICD-10-CM | POA: Diagnosis not present

## 2020-11-09 DIAGNOSIS — I110 Hypertensive heart disease with heart failure: Secondary | ICD-10-CM | POA: Diagnosis not present

## 2020-11-09 DIAGNOSIS — E1169 Type 2 diabetes mellitus with other specified complication: Secondary | ICD-10-CM | POA: Diagnosis not present

## 2020-11-09 DIAGNOSIS — Z48812 Encounter for surgical aftercare following surgery on the circulatory system: Secondary | ICD-10-CM | POA: Diagnosis not present

## 2020-11-09 DIAGNOSIS — I25110 Atherosclerotic heart disease of native coronary artery with unstable angina pectoris: Secondary | ICD-10-CM | POA: Diagnosis not present

## 2020-11-09 DIAGNOSIS — I214 Non-ST elevation (NSTEMI) myocardial infarction: Secondary | ICD-10-CM | POA: Diagnosis not present

## 2020-11-09 DIAGNOSIS — I2584 Coronary atherosclerosis due to calcified coronary lesion: Secondary | ICD-10-CM | POA: Diagnosis not present

## 2020-11-09 NOTE — Telephone Encounter (Signed)
Left message asking Colin [name] to return call to [phone].  

## 2020-11-10 NOTE — Telephone Encounter (Signed)
Colin [name] is aware that all patient medications are prescribed by cardiology or endocrinology. Has cardiology appointment on 10/7 and they will discuss medications with patient. Colin [name] will inform patients home nurse of this information.

## 2020-11-11 ENCOUNTER — Other Ambulatory Visit: Payer: Self-pay

## 2020-11-11 ENCOUNTER — Encounter: Payer: Self-pay | Admitting: Cardiology

## 2020-11-11 ENCOUNTER — Ambulatory Visit: Payer: Medicare HMO | Admitting: Cardiology

## 2020-11-11 VITALS — BP 138/73 | HR 83 | Temp 98.0°F | Resp 16 | Ht 64.0 in | Wt 133.0 lb

## 2020-11-11 DIAGNOSIS — E1151 Type 2 diabetes mellitus with diabetic peripheral angiopathy without gangrene: Secondary | ICD-10-CM | POA: Diagnosis not present

## 2020-11-11 DIAGNOSIS — I5020 Unspecified systolic (congestive) heart failure: Secondary | ICD-10-CM

## 2020-11-11 DIAGNOSIS — R0602 Shortness of breath: Secondary | ICD-10-CM | POA: Diagnosis not present

## 2020-11-11 DIAGNOSIS — I2510 Atherosclerotic heart disease of native coronary artery without angina pectoris: Secondary | ICD-10-CM | POA: Diagnosis not present

## 2020-11-11 DIAGNOSIS — I10 Essential (primary) hypertension: Secondary | ICD-10-CM

## 2020-11-11 MED ORDER — CLOPIDOGREL BISULFATE 75 MG PO TABS: 75.0000 mg | ORAL_TABLET | Freq: Every day | ORAL | 3 refills | Status: DC

## 2020-11-11 MED ORDER — PANTOPRAZOLE SODIUM 20 MG PO TBEC
DELAYED_RELEASE_TABLET | ORAL | 3 refills | Status: DC
  Filled 2020-11-11 – 2021-02-14 (×2): qty 90, 90d supply, fill #0

## 2020-11-11 MED ORDER — ASPIRIN EC 81 MG PO TBEC
81.0000 mg | DELAYED_RELEASE_TABLET | Freq: Every day | ORAL | 3 refills | Status: AC
Start: 2020-11-11 — End: 2021-04-10

## 2020-11-11 MED ORDER — BISOPROLOL FUMARATE 5 MG PO TABS: 5.0000 mg | ORAL_TABLET | Freq: Every day | ORAL | 3 refills | Status: DC

## 2020-11-11 MED ORDER — PANTOPRAZOLE SODIUM 20 MG PO TBEC: 20.0000 mg | DELAYED_RELEASE_TABLET | Freq: Every day | ORAL | 3 refills | Status: DC

## 2020-11-11 MED ORDER — NITROGLYCERIN 0.4 MG SL SUBL
0.4000 mg | SUBLINGUAL_TABLET | SUBLINGUAL | 3 refills | Status: DC | PRN
Start: 2020-11-11 — End: 2021-12-24

## 2020-11-11 MED ORDER — ROSUVASTATIN CALCIUM 40 MG PO TABS: 40.0000 mg | ORAL_TABLET | Freq: Every day | ORAL | 3 refills | Status: DC

## 2020-11-11 MED ORDER — BISOPROLOL FUMARATE 5 MG PO TABS
ORAL_TABLET | ORAL | 3 refills | Status: DC
  Filled 2020-11-11 – 2021-02-14 (×2): qty 90, 90d supply, fill #0

## 2020-11-11 MED ORDER — ROSUVASTATIN CALCIUM 40 MG PO TABS
ORAL_TABLET | ORAL | 3 refills | Status: DC
  Filled 2020-11-11 – 2021-02-14 (×2): qty 90, 90d supply, fill #0

## 2020-11-11 MED ORDER — NITROGLYCERIN 0.4 MG SL SUBL
SUBLINGUAL_TABLET | SUBLINGUAL | 3 refills | Status: DC
  Filled 2020-11-11 – 2021-02-14 (×2): qty 25, 25d supply, fill #0

## 2020-11-11 MED ORDER — CLOPIDOGREL BISULFATE 75 MG PO TABS
ORAL_TABLET | ORAL | 3 refills | Status: DC
  Filled 2020-11-11 – 2021-02-14 (×2): qty 90, 90d supply, fill #0

## 2020-11-11 NOTE — Progress Notes (Signed)
Patient referred by Kerin Perna, NP for CAD  Subjective:   Colin [name], male    DOB: 15-Jun-1950, 70 y.o.   MRN: [id number]  [facility] interpreter unavailable.  [place] interpreter assisted in today's visit.   Chief Complaint  Patient presents with   Coronary Artery Disease   Hypertension   Follow-up    2-3 week    HPI  70 year old [name] speaking patient with uncontrolled type 2 diabetes mellitus, mixed hyperlipidemia, tobacco dependence, coronary artery disease with prior MI, LCX and RCA stents in 2012, now with CABG X 3.  LIMA LAD, RSVG PDA, OM1  (08/23/2020), HFrEF  Patient missed his follow-up appointment after hospitalization where he underwent CABG.  He is doing well.  He still has pain around CABG scar, but does not have any exertional chest pain or dyspnea symptoms.  Also denies any orthopnea, PND, leg edema symptoms.  He has not been taking any cardiac medications for several days as he ran out of them.  He did not know any of his baseline medications and he had to contact his PCP office to get further information.  Currently only taking his insulin, gabapentin, and pain medication.  Blood pressure was extremely elevated on initial check, but has not subsequently improved.   Past Medical History:  Diagnosis Date   Arrhythmia    CAD (coronary artery disease)    stent x 2   Carotid artery occlusion    Diabetes mellitus    Dyslipidemia    Essential hypertension      Past Surgical History:  Procedure Laterality Date   CARDIAC CATHETERIZATION     01/2010 ; 04/2010   CORONARY ARTERY BYPASS GRAFT N/A 08/23/2020   Procedure: CORONARY ARTERY BYPASS GRAFTING (CABG) x 3 ON CARDIOPULMONARY BYPASS USING EVH AND PARTIALLY OPEN HARVESTED LEFT GREATER SAPHENOUS VEIN AND LEFT INTERNAL MAMMARY ARTERY.  LIMA TO LAD, SVG TO OM, SVG TO PDA;  Surgeon: Lajuana Matte, MD;  Location: [place];  Service: Open Heart Surgery;  Laterality: N/A;   RIGHT/LEFT HEART CATH AND  CORONARY ANGIOGRAPHY N/A 08/22/2020   Procedure: RIGHT/LEFT HEART CATH AND CORONARY ANGIOGRAPHY;  Surgeon: Nigel Mormon, MD;  Location: [place] [facility];  Service: Cardiovascular;  Laterality: N/A;   TEE WITHOUT CARDIOVERSION N/A 08/23/2020   Procedure: TRANSESOPHAGEAL ECHOCARDIOGRAM (TEE);  Surgeon: Lajuana Matte, MD;  Location: [place];  Service: Open Heart Surgery;  Laterality: N/A;   TEMPORARY PACEMAKER N/A 08/22/2020   Procedure: TEMPORARY PACEMAKER;  Surgeon: Nigel Mormon, MD;  Location: [place] [facility];  Service: Cardiovascular;  Laterality: N/A;   VEIN HARVEST Left 08/23/2020   Procedure: ENDOVEIN HARVEST AND PARTIALLY OPEN GREATER SAPHENOUS VEIN HARVESTING;  Surgeon: Lajuana Matte, MD;  Location: [place];  Service: Open Heart Surgery;  Laterality: Left;     Social History   Tobacco Use  Smoking Status Every Day   Packs/day: 0.25   Types: Cigarettes  Smokeless Tobacco Never    Social History   Substance and Sexual Activity  Alcohol Use No     Family History  Problem Relation Age of Onset   Diabetes Neg Hx      Current Outpatient Medications on File Prior to Visit  Medication Sig Dispense Refill   Accu-Chek Softclix Lancets lancets Use to check blood sugar three times daily. 100 each 6   aspirin EC 81 MG tablet Take 1 tablet (81 mg total) by mouth daily. Swallow whole. 50 tablet 2  bisoprolol (ZEBETA) 5 MG tablet Take 0.5 tablets (2.5 mg total) by mouth daily. 30 tablet 1   Blood Glucose Monitoring Suppl (ACCU-CHEK GUIDE) w/Device KIT USE AS DIRECTED 1 kit 0   clopidogrel (PLAVIX) 75 MG tablet Take 1 tablet (75 mg total) by mouth daily. 30 tablet 1   dapagliflozin propanediol (FARXIGA) 10 MG TABS tablet Take 1 tablet (10 mg total) by mouth daily. 30 tablet 1   gabapentin (NEURONTIN) 100 MG capsule TAKE 1 CAPSULE (100 MG TOTAL) BY MOUTH 3 (THREE) TIMES DAILY. 270 capsule 1   glucose blood (ACCU-CHEK GUIDE) test strip Use twice daily. And  lancets 2/day 100 each 6   insulin glargine (LANTUS) 100 UNIT/ML Solostar Pen Inject 32 Units into the skin every morning. 45 mL 3   Insulin Pen Needle (TECHLITE PEN NEEDLES) 32G X 4 MM MISC Use as instructed to inject Lantus and Victoza once daily. 200 each 6   ivabradine (CORLANOR) 5 MG TABS tablet Take 1 tablet (5 mg total) by mouth 2 (two) times daily with a meal. 60 tablet 0   midodrine (PROAMATINE) 2.5 MG tablet Take 1 tablet (2.5 mg total) by mouth 3 (three) times daily with meals. 90 tablet 1   oxyCODONE (OXY IR/ROXICODONE) 5 MG immediate release tablet Take 1 tablet (5 mg total) by mouth every 6 (six) hours as needed for severe pain. 30 tablet 0   rosuvastatin (CRESTOR) 40 MG tablet Take 1 tablet (40 mg total) by mouth at bedtime. 30 tablet 1   spironolactone (ALDACTONE) 25 MG tablet Take 0.5 tablets (12.5 mg total) by mouth daily. 30 tablet 1   No current facility-administered medications on file prior to visit.    Cardiovascular and other pertinent studies:  EKG 08/29/2020: Sinus rhythm 80 bpm Anteroseptal and inferior infarct, age indeterminate    Op note 08/23/2020 (Dr. Kipp Brood): CABG X 3.  LIMA LAD, RSVG PDA, OM1     Coronary angiography 08/22/2020: LM: Minimal disease LAD: Prox-mid long 80% stenosis Lcx: Lcx/OM1 95% ISR RCA: Prox 80% ISR, followed by In-stent CTO mid-distal vessel Left-to-right collaterals to distal vessel Mildly decompensated ischemic cardiomyopathy Temp pacer placed through RIJ   Surgical consult for CABG   Echocardiogram 08/20/2020:  1. Left ventricular ejection fraction, by estimation, is 30 to 35%. The  left ventricle has moderate to severely decreased function. The left  ventricle demonstrates global hypokinesis. The left ventricular internal  cavity size was mildly dilated. Left  ventricular diastolic parameters are consistent with Grade II diastolic  dysfunction (pseudonormalization). Elevated left atrial pressure.   2. Right ventricular  systolic function is mildly reduced. The right  ventricular size is normal.   3. Left atrial size was mildly dilated.   4. The mitral valve is degenerative. Moderate mitral valve regurgitation.  No evidence of mitral stenosis.   5. The aortic valve is tricuspid. Aortic valve regurgitation is not  visualized. Mild aortic valve sclerosis is present, with no evidence of  aortic valve stenosis.   6. The inferior vena cava is normal in size with greater than 50%  respiratory variability, suggesting right atrial pressure of 3 mmHg.   7. Evidence of atrial level shunting detected by color flow Doppler.     Recent labs: 08/29/2020: Glucose 199, BUN/Cr 37/1.34. EGFR 57. Na/K 137/4.4. Albumin 2.6. Rest of the CMP normal H/H 11.5/35,3. MCV 92.9. Platelets 353 HbA1C 8.4% Chol 187, TG 53, HDL 40, LDL 137 TSH N/A    Review of Systems  Cardiovascular:  Negative for  chest pain, dyspnea on exertion, leg swelling, palpitations and syncope.       Chest wall pain around CABG scar        Vitals:   11/11/20 0920 11/11/20 0922  BP: (!) 204/100 (!) 194/106  Pulse: 83 86  Resp: 16   Temp: 98 F (36.7 C)   SpO2: 96%      Body mass index is 22.83 kg/m. Filed Weights   11/11/20 0920  Weight: 133 lb (60.3 kg)     Objective:   Physical Exam Vitals and nursing note reviewed.  Constitutional:      General: He is not in acute distress. Neck:     Vascular: No JVD.  Cardiovascular:     Rate and Rhythm: Normal rate and regular rhythm.     Pulses:          Radial pulses are 2+ on the right side and 2+ on the left side.       Dorsalis pedis pulses are 0 on the right side and 0 on the left side.       Posterior tibial pulses are 2+ on the right side and 2+ on the left side.     Heart sounds: Normal heart sounds. No murmur heard. Pulmonary:     Effort: Pulmonary effort is normal.     Breath sounds: Normal breath sounds. No wheezing or rales.  Musculoskeletal:     Right lower leg: No edema.      Left lower leg: No edema.         ssessment & Recommendations:   70 year old [name] speaking patient with uncontrolled type 2 diabetes mellitus, mixed hyperlipidemia, tobacco dependence, coronary artery disease with prior MI, LCX and RCA stents in 2012, now with CABG X 3.  LIMA LAD, RSVG PDA, OM1  (08/23/2020), HFrEF   CAD:  S/p CABG X 3.  LIMA LAD, RSVG PDA, OM1  (08/23/2020) Currently not taking any cardiac medications. Given ACS presentation prior to CABG, recommend DAPT with Aspirin and plavix till 08/2021. Resume rosuvastatin 40 mg. Symptomatic sinus pause pre CABG, most likely in the setting of ischemia.  No recurrence during recent hospitalization.  Resting heart rate normal at this time. Resume bisoprolol 5 mg daily.   HFrEF: EF 30-35%. Ischemic cardiomyopathy. Euvolumic.  Since there is no recent lab work is available, have not started him on Entresto/spironolactone. Also, I will recheck echocardiogram to look for any improvement in his EF sensation.  Currently, he does not appear to be in clinical heart failure.   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 Continue medical therapy.   CBC, BMP, TSH, BNP today. Follow-up with Lawerance Cruel, PA in 2 weeks.  Time spent: 45 min  Thank you for referring the patient to [place]. Please feel free to contact with any questions.   Nigel Mormon, MD Pager: [phone] Office: [phone]

## 2020-11-12 LAB — BASIC METABOLIC PANEL
BUN/Creatinine Ratio: 17 (ref 10–24)
BUN: 17 mg/dL (ref 8–27)
CO2: 25 mmol/L (ref 20–29)
Calcium: 9 mg/dL (ref 8.6–10.2)
Chloride: 105 mmol/L (ref 96–106)
Creatinine, Ser: 1.03 mg/dL (ref 0.76–1.27)
Glucose: 144 mg/dL — ABNORMAL HIGH (ref 70–99)
Potassium: 3.9 mmol/L (ref 3.5–5.2)
Sodium: 143 mmol/L (ref 134–144)
eGFR: 78 mL/min/{1.73_m2} (ref >59)

## 2020-11-12 LAB — LIPID PANEL
Chol/HDL Ratio: 4.6 ratio (ref 0.0–5.0)
Cholesterol, Total: 279 mg/dL — ABNORMAL HIGH (ref 100–199)
HDL: 61 mg/dL (ref >39)
LDL Chol Calc (NIH): 207 mg/dL — ABNORMAL HIGH (ref 0–99)
Triglycerides: 71 mg/dL (ref 0–149)
VLDL Cholesterol Cal: 11 mg/dL (ref 5–40)

## 2020-11-12 LAB — CBC
Hematocrit: 41.1 % (ref 37.5–51.0)
Hemoglobin: 13.8 g/dL (ref 13.0–17.7)
MCH: 29.7 pg (ref 26.6–33.0)
MCHC: 33.6 g/dL (ref 31.5–35.7)
MCV: 89 fL (ref 79–97)
Platelets: 213 10*3/uL (ref 150–450)
RBC: 4.64 x10E6/uL (ref 4.14–5.80)
RDW: 14 % (ref 11.6–15.4)
WBC: 6.1 10*3/uL (ref 3.4–10.8)

## 2020-11-12 LAB — TSH: TSH: 3.85 u[IU]/mL (ref 0.450–4.500)

## 2020-11-12 LAB — BRAIN NATRIURETIC PEPTIDE: BNP: 387.6 pg/mL — ABNORMAL HIGH (ref 0.0–100.0)

## 2020-11-15 ENCOUNTER — Other Ambulatory Visit: Payer: Self-pay

## 2020-11-15 ENCOUNTER — Other Ambulatory Visit: Payer: Self-pay | Admitting: Cardiology

## 2020-11-15 DIAGNOSIS — I10 Essential (primary) hypertension: Secondary | ICD-10-CM

## 2020-11-15 DIAGNOSIS — I5020 Unspecified systolic (congestive) heart failure: Secondary | ICD-10-CM

## 2020-11-15 DIAGNOSIS — E782 Mixed hyperlipidemia: Secondary | ICD-10-CM

## 2020-11-15 DIAGNOSIS — I2510 Atherosclerotic heart disease of native coronary artery without angina pectoris: Secondary | ICD-10-CM

## 2020-11-15 MED ORDER — ENTRESTO 24-26 MG PO TABS
1.0000 | ORAL_TABLET | Freq: Two times a day (BID) | ORAL | 2 refills | Status: DC
  Filled 2020-11-15: qty 60, 30d supply, fill #0

## 2020-11-15 NOTE — Progress Notes (Signed)
LDL 207. He had not been taking statin. Prescribed Crestor 40 mg at last visit. Repeat lipid panel in 1 month (Ordered) BNP is elevated. Cr is normal. Start Entresto 24-26 mg bid. Check BMP In 1 week. (Ordered)  Patient speaks limited English. Will need [place] interpreter since there is no Counselling psychologist interpreter available.  Thanks MJP

## 2020-11-15 NOTE — Addendum Note (Signed)
Addended by: [name] on: 11/15/2020 09:05 AM   Modules accepted: Orders

## 2020-11-15 NOTE — Progress Notes (Signed)
Called pt no answer, left a vm and ordered labs for him already.

## 2020-11-16 NOTE — Progress Notes (Signed)
Called patient, said noone was home to translate, tried calling his son [name], NA, LMAM.

## 2020-11-18 NOTE — Progress Notes (Signed)
3rd attempt : Patient answered, does not speak nor does he understand [place].

## 2020-11-22 ENCOUNTER — Other Ambulatory Visit: Payer: Self-pay

## 2020-11-23 DIAGNOSIS — I110 Hypertensive heart disease with heart failure: Secondary | ICD-10-CM | POA: Diagnosis not present

## 2020-11-23 DIAGNOSIS — I25110 Atherosclerotic heart disease of native coronary artery with unstable angina pectoris: Secondary | ICD-10-CM | POA: Diagnosis not present

## 2020-11-23 DIAGNOSIS — E1169 Type 2 diabetes mellitus with other specified complication: Secondary | ICD-10-CM | POA: Diagnosis not present

## 2020-11-23 DIAGNOSIS — I214 Non-ST elevation (NSTEMI) myocardial infarction: Secondary | ICD-10-CM | POA: Diagnosis not present

## 2020-11-23 DIAGNOSIS — E782 Mixed hyperlipidemia: Secondary | ICD-10-CM | POA: Diagnosis not present

## 2020-11-23 DIAGNOSIS — I5020 Unspecified systolic (congestive) heart failure: Secondary | ICD-10-CM | POA: Diagnosis not present

## 2020-11-23 DIAGNOSIS — I2584 Coronary atherosclerosis due to calcified coronary lesion: Secondary | ICD-10-CM | POA: Diagnosis not present

## 2020-11-23 DIAGNOSIS — E43 Unspecified severe protein-calorie malnutrition: Secondary | ICD-10-CM | POA: Diagnosis not present

## 2020-11-23 DIAGNOSIS — Z48812 Encounter for surgical aftercare following surgery on the circulatory system: Secondary | ICD-10-CM | POA: Diagnosis not present

## 2020-12-05 DIAGNOSIS — I214 Non-ST elevation (NSTEMI) myocardial infarction: Secondary | ICD-10-CM | POA: Diagnosis not present

## 2020-12-05 DIAGNOSIS — I25110 Atherosclerotic heart disease of native coronary artery with unstable angina pectoris: Secondary | ICD-10-CM | POA: Diagnosis not present

## 2020-12-05 DIAGNOSIS — E43 Unspecified severe protein-calorie malnutrition: Secondary | ICD-10-CM | POA: Diagnosis not present

## 2020-12-05 DIAGNOSIS — I5020 Unspecified systolic (congestive) heart failure: Secondary | ICD-10-CM | POA: Diagnosis not present

## 2020-12-05 DIAGNOSIS — E782 Mixed hyperlipidemia: Secondary | ICD-10-CM | POA: Diagnosis not present

## 2020-12-05 DIAGNOSIS — Z48812 Encounter for surgical aftercare following surgery on the circulatory system: Secondary | ICD-10-CM | POA: Diagnosis not present

## 2020-12-05 DIAGNOSIS — E1169 Type 2 diabetes mellitus with other specified complication: Secondary | ICD-10-CM | POA: Diagnosis not present

## 2020-12-05 DIAGNOSIS — I2584 Coronary atherosclerosis due to calcified coronary lesion: Secondary | ICD-10-CM | POA: Diagnosis not present

## 2020-12-05 DIAGNOSIS — I110 Hypertensive heart disease with heart failure: Secondary | ICD-10-CM | POA: Diagnosis not present

## 2020-12-07 ENCOUNTER — Ambulatory Visit: Payer: Medicare HMO | Admitting: Student

## 2020-12-07 NOTE — Progress Notes (Deleted)
Patient referred by Kerin Perna, NP for CAD  Subjective:   Colin [name], male    DOB: 1950-09-08, 70 y.o.   MRN: [id number]  [facility] interpreter unavailable.  [place] interpreter assisted in today's visit.   No chief complaint on file.   HPI  70 year old [name] speaking patient with uncontrolled type 2 diabetes mellitus, mixed hyperlipidemia, tobacco dependence, coronary artery disease with prior MI, LCX and RCA stents in 2012, now with CABG X 3.  LIMA LAD, RSVG PDA, OM1  (08/23/2020), HFrEF  Patient presents for 2-week follow-up.  He was last seen in our office 11/11/2020 by Dr. Virgina Jock at which time resumed Crestor 40 mg daily and started Entresto 24/26 mg twice daily.  Unfortunately repeat BMP has not been done. ***  ***  Patient missed his follow-up appointment after hospitalization where he underwent CABG.  He is doing well.  He still has pain around CABG scar, but does not have any exertional chest pain or dyspnea symptoms.  Also denies any orthopnea, PND, leg edema symptoms.  He has not been taking any cardiac medications for several days as he ran out of them.  He did not know any of his baseline medications and he had to contact his PCP office to get further information.  Currently only taking his insulin, gabapentin, and pain medication.  Blood pressure was extremely elevated on initial check, but has not subsequently improved.  Past Medical History:  Diagnosis Date   Arrhythmia    CAD (coronary artery disease)    stent x 2   Carotid artery occlusion    Diabetes mellitus    Dyslipidemia    Essential hypertension    Past Surgical History:  Procedure Laterality Date   CARDIAC CATHETERIZATION     01/2010 ; 04/2010   CORONARY ARTERY BYPASS GRAFT N/A 08/23/2020   Procedure: CORONARY ARTERY BYPASS GRAFTING (CABG) x 3 ON CARDIOPULMONARY BYPASS USING EVH AND PARTIALLY OPEN HARVESTED LEFT GREATER SAPHENOUS VEIN AND LEFT INTERNAL MAMMARY ARTERY.  LIMA TO LAD,  SVG TO OM, SVG TO PDA;  Surgeon: Lajuana Matte, MD;  Location: [place];  Service: Open Heart Surgery;  Laterality: N/A;   RIGHT/LEFT HEART CATH AND CORONARY ANGIOGRAPHY N/A 08/22/2020   Procedure: RIGHT/LEFT HEART CATH AND CORONARY ANGIOGRAPHY;  Surgeon: Nigel Mormon, MD;  Location: [place] [facility];  Service: Cardiovascular;  Laterality: N/A;   TEE WITHOUT CARDIOVERSION N/A 08/23/2020   Procedure: TRANSESOPHAGEAL ECHOCARDIOGRAM (TEE);  Surgeon: Lajuana Matte, MD;  Location: [place];  Service: Open Heart Surgery;  Laterality: N/A;   TEMPORARY PACEMAKER N/A 08/22/2020   Procedure: TEMPORARY PACEMAKER;  Surgeon: Nigel Mormon, MD;  Location: [place] [facility];  Service: Cardiovascular;  Laterality: N/A;   VEIN HARVEST Left 08/23/2020   Procedure: ENDOVEIN HARVEST AND PARTIALLY OPEN GREATER SAPHENOUS VEIN HARVESTING;  Surgeon: Lajuana Matte, MD;  Location: [place];  Service: Open Heart Surgery;  Laterality: Left;   Social History   Tobacco Use  Smoking Status Former   Packs/day: 0.25   Years: 50.00   Pack years: 12.50   Types: Cigarettes   Quit date: 08/2020   Years since quitting: 0.3  Smokeless Tobacco Never   Social History   Substance and Sexual Activity  Alcohol Use No   Family History  Problem Relation Age of Onset   Diabetes Neg Hx      Current Outpatient Medications on File Prior to Visit  Medication Sig Dispense Refill   Accu-Chek  Softclix Lancets lancets Use to check blood sugar three times daily. (Patient not taking: Reported on 11/11/2020) 100 each 6   aspirin EC 81 MG tablet Take 1 tablet (81 mg total) by mouth daily. Swallow whole. 90 tablet 3   bisoprolol (ZEBETA) 5 MG tablet Take 1 tablet (5 mg total) by mouth daily. 90 tablet 3   bisoprolol (ZEBETA) 5 MG tablet take 1 tablet by mouth daily 90 tablet 3   Blood Glucose Monitoring Suppl (ACCU-CHEK GUIDE) w/Device KIT USE AS DIRECTED (Patient not taking: Reported on 11/11/2020) 1 kit 0    clopidogrel (PLAVIX) 75 MG tablet Take 1 tablet (75 mg total) by mouth daily. 90 tablet 3   clopidogrel (PLAVIX) 75 MG tablet take 1 tablet by mouth daily 90 tablet 3   dapagliflozin propanediol (FARXIGA) 10 MG TABS tablet Take 1 tablet (10 mg total) by mouth daily. (Patient not taking: Reported on 11/11/2020) 30 tablet 1   gabapentin (NEURONTIN) 100 MG capsule TAKE 1 CAPSULE (100 MG TOTAL) BY MOUTH 3 (THREE) TIMES DAILY. (Patient not taking: Reported on 11/11/2020) 270 capsule 1   glucose blood (ACCU-CHEK GUIDE) test strip Use twice daily. And lancets 2/day (Patient not taking: Reported on 11/11/2020) 100 each 6   insulin glargine (LANTUS) 100 UNIT/ML Solostar Pen Inject 32 Units into the skin every morning. 45 mL 3   Insulin Pen Needle (TECHLITE PEN NEEDLES) 32G X 4 MM MISC Use as instructed to inject Lantus and Victoza once daily. 200 each 6   ivabradine (CORLANOR) 5 MG TABS tablet Take 1 tablet (5 mg total) by mouth 2 (two) times daily with a meal. (Patient not taking: Reported on 11/11/2020) 60 tablet 0   midodrine (PROAMATINE) 2.5 MG tablet Take 1 tablet (2.5 mg total) by mouth 3 (three) times daily with meals. (Patient not taking: Reported on 11/11/2020) 90 tablet 1   nitroGLYCERIN (NITROSTAT) 0.4 MG SL tablet Place 1 tablet (0.4 mg total) under the tongue every 5 (five) minutes as needed for chest pain. 30 tablet 3   nitroGLYCERIN (NITROSTAT) 0.4 MG SL tablet Place 1 tab (0.61m total) under the tongue every 5 minutes as needed for chest pain 30 tablet 3   oxyCODONE (OXY IR/ROXICODONE) 5 MG immediate release tablet Take 1 tablet (5 mg total) by mouth every 6 (six) hours as needed for severe pain. 30 tablet 0   pantoprazole (PROTONIX) 20 MG tablet Take 1 tablet (20 mg total) by mouth daily. 90 tablet 3   pantoprazole (PROTONIX) 20 MG tablet Take 1 tab by mouth daily 90 tablet 3   rosuvastatin (CRESTOR) 40 MG tablet Take 1 tablet (40 mg total) by mouth at bedtime. 90 tablet 3   rosuvastatin (CRESTOR)  40 MG tablet take 1 tab by mouth at bedtime 90 tablet 3   sacubitril-valsartan (ENTRESTO) 24-26 MG Take 1 tablet by mouth 2 (two) times daily. 60 tablet 2   spironolactone (ALDACTONE) 25 MG tablet Take 0.5 tablets (12.5 mg total) by mouth daily. (Patient not taking: Reported on 11/11/2020) 30 tablet 1   No current facility-administered medications on file prior to visit.    Cardiovascular and other pertinent studies:  EKG 08/29/2020: Sinus rhythm 80 bpm Anteroseptal and inferior infarct, age indeterminate  Op note 08/23/2020 (Dr. LKipp Brood: CABG X 3.  LIMA LAD, RSVG PDA, OM1     Coronary angiography 08/22/2020: LM: Minimal disease LAD: Prox-mid long 80% stenosis Lcx: Lcx/OM1 95% ISR RCA: Prox 80% ISR, followed by In-stent CTO mid-distal vessel Left-to-right collaterals  to distal vessel Mildly decompensated ischemic cardiomyopathy Temp pacer placed through [facility]   Surgical consult for CABG   Echocardiogram 08/20/2020:  1. Left ventricular ejection fraction, by estimation, is 30 to 35%. The  left ventricle has moderate to severely decreased function. The left  ventricle demonstrates global hypokinesis. The left ventricular internal  cavity size was mildly dilated. Left  ventricular diastolic parameters are consistent with Grade II diastolic  dysfunction (pseudonormalization). Elevated left atrial pressure.   2. Right ventricular systolic function is mildly reduced. The right  ventricular size is normal.   3. Left atrial size was mildly dilated.   4. The mitral valve is degenerative. Moderate mitral valve regurgitation.  No evidence of mitral stenosis.   5. The aortic valve is tricuspid. Aortic valve regurgitation is not  visualized. Mild aortic valve sclerosis is present, with no evidence of  aortic valve stenosis.   6. The inferior vena cava is normal in size with greater than 50%  respiratory variability, suggesting right atrial pressure of 3 mmHg.   7. Evidence of atrial level  shunting detected by color flow Doppler.     Recent labs: CMP Latest Ref Rng & Units 11/11/2020 08/29/2020 08/28/2020  Glucose 70 - 99 mg/dL 144(H) 199(H) 126(H)  BUN 8 - 27 mg/dL 17 37(H) 31(H)  Creatinine 0.76 - 1.27 mg/dL 1.03 1.34(H) 1.36(H)  Sodium 134 - 144 mmol/L 143 137 139  Potassium 3.5 - 5.2 mmol/L 3.9 4.4 4.1  Chloride 96 - 106 mmol/L 105 98 100  CO2 20 - 29 mmol/L 25 30 30   Calcium 8.6 - 10.2 mg/dL 9.0 8.8(L) 8.3(L)  Total Protein 6.5 - 8.1 g/dL - - -  Total Bilirubin 0.3 - 1.2 mg/dL - - -  Alkaline Phos 38 - 126 U/L - - -  AST 15 - 41 U/L - - -  ALT 0 - 44 U/L - - -   CBC Latest Ref Rng & Units 11/11/2020 08/29/2020 08/28/2020  WBC 3.4 - 10.8 x10E3/uL 6.1 6.4 6.1  Hemoglobin 13.0 - 17.7 g/dL 13.8 11.5(L) 10.0(L)  Hematocrit 37.5 - 51.0 % 41.1 35.3(L) 30.6(L)  Platelets 150 - 450 x10E3/uL 213 353 245   Lipid Panel     Component Value Date/Time   CHOL 279 (H) 11/11/2020 1136   TRIG 71 11/11/2020 1136   HDL 61 11/11/2020 1136   CHOLHDL 4.6 11/11/2020 1136   CHOLHDL 4.7 08/21/2020 0039   VLDL 11 08/21/2020 0039   LDLCALC 207 (H) 11/11/2020 1136   LDLDIRECT 137.1 (H) 08/21/2020 0039   HEMOGLOBIN A1C Lab Results  Component Value Date   HGBA1C 8.4 (A) 10/24/2020   MPG 223.08 08/23/2020   TSH Recent Labs    11/11/20 1136  TSH 3.850     08/29/2020: Glucose 199, BUN/Cr 37/1.34. EGFR 57. Na/K 137/4.4. Albumin 2.6. Rest of the CMP normal H/H 11.5/35,3. MCV 92.9. Platelets 353 HbA1C 8.4% Chol 187, TG 53, HDL 40, LDL 137 TSH N/A    Review of Systems  Cardiovascular:  Negative for chest pain, dyspnea on exertion, leg swelling, palpitations and syncope.       Chest wall pain around CABG scar        There were no vitals filed for this visit.    There is no height or weight on file to calculate BMI. There were no vitals filed for this visit.    Objective:   Physical Exam Vitals and nursing note reviewed.  Constitutional:      General: He is  not in  acute distress. Neck:     Vascular: No JVD.  Cardiovascular:     Rate and Rhythm: Normal rate and regular rhythm.     Pulses:          Radial pulses are 2+ on the right side and 2+ on the left side.       Dorsalis pedis pulses are 0 on the right side and 0 on the left side.       Posterior tibial pulses are 2+ on the right side and 2+ on the left side.     Heart sounds: Normal heart sounds. No murmur heard. Pulmonary:     Effort: Pulmonary effort is normal.     Breath sounds: Normal breath sounds. No wheezing or rales.  Musculoskeletal:     Right lower leg: No edema.     Left lower leg: No edema.         ssessment & Recommendations:   70 year old [name] speaking patient with uncontrolled type 2 diabetes mellitus, mixed hyperlipidemia, tobacco dependence, coronary artery disease with prior MI, LCX and RCA stents in 2012, now with CABG X 3.  LIMA LAD, RSVG PDA, OM1  (08/23/2020), HFrEF   CAD: *** S/p CABG X 3.  LIMA LAD, RSVG PDA, OM1  (08/23/2020) Currently not taking any cardiac medications. Given ACS presentation prior to CABG, recommend DAPT with Aspirin and plavix till 08/2021. Resume rosuvastatin 40 mg. Symptomatic sinus pause pre CABG, most likely in the setting of ischemia.  No recurrence during recent hospitalization.  Resting heart rate normal at this time. Resume bisoprolol 5 mg daily.   HFrEF: EF 30-35%. Ischemic cardiomyopathy. Euvolumic.  Since there is no recent lab work is available, have not started him on Entresto/spironolactone. Also, I will recheck echocardiogram to look for any improvement in his EF sensation.  Currently, he does not appear to be in clinical heart failure.   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 Continue medical therapy.   CBC, BMP, TSH, BNP today. Follow-up with Lawerance Cruel, PA in 2 weeks.  Time spent: 45 min  Thank you  for referring the patient to [place]. Please feel free to contact with any questions.   Nigel Mormon, MD Pager: [phone] Office: [phone]

## 2020-12-10 DIAGNOSIS — I2584 Coronary atherosclerosis due to calcified coronary lesion: Secondary | ICD-10-CM | POA: Diagnosis not present

## 2020-12-10 DIAGNOSIS — I110 Hypertensive heart disease with heart failure: Secondary | ICD-10-CM | POA: Diagnosis not present

## 2020-12-10 DIAGNOSIS — E43 Unspecified severe protein-calorie malnutrition: Secondary | ICD-10-CM | POA: Diagnosis not present

## 2020-12-10 DIAGNOSIS — I214 Non-ST elevation (NSTEMI) myocardial infarction: Secondary | ICD-10-CM | POA: Diagnosis not present

## 2020-12-10 DIAGNOSIS — E782 Mixed hyperlipidemia: Secondary | ICD-10-CM | POA: Diagnosis not present

## 2020-12-10 DIAGNOSIS — I5020 Unspecified systolic (congestive) heart failure: Secondary | ICD-10-CM | POA: Diagnosis not present

## 2020-12-10 DIAGNOSIS — Z48812 Encounter for surgical aftercare following surgery on the circulatory system: Secondary | ICD-10-CM | POA: Diagnosis not present

## 2020-12-10 DIAGNOSIS — E1169 Type 2 diabetes mellitus with other specified complication: Secondary | ICD-10-CM | POA: Diagnosis not present

## 2020-12-10 DIAGNOSIS — I25110 Atherosclerotic heart disease of native coronary artery with unstable angina pectoris: Secondary | ICD-10-CM | POA: Diagnosis not present

## 2020-12-18 DIAGNOSIS — I214 Non-ST elevation (NSTEMI) myocardial infarction: Secondary | ICD-10-CM | POA: Diagnosis not present

## 2020-12-18 DIAGNOSIS — I5020 Unspecified systolic (congestive) heart failure: Secondary | ICD-10-CM | POA: Diagnosis not present

## 2020-12-18 DIAGNOSIS — E782 Mixed hyperlipidemia: Secondary | ICD-10-CM | POA: Diagnosis not present

## 2020-12-18 DIAGNOSIS — I110 Hypertensive heart disease with heart failure: Secondary | ICD-10-CM | POA: Diagnosis not present

## 2020-12-18 DIAGNOSIS — Z48812 Encounter for surgical aftercare following surgery on the circulatory system: Secondary | ICD-10-CM | POA: Diagnosis not present

## 2020-12-18 DIAGNOSIS — E1169 Type 2 diabetes mellitus with other specified complication: Secondary | ICD-10-CM | POA: Diagnosis not present

## 2020-12-18 DIAGNOSIS — I2584 Coronary atherosclerosis due to calcified coronary lesion: Secondary | ICD-10-CM | POA: Diagnosis not present

## 2020-12-18 DIAGNOSIS — E43 Unspecified severe protein-calorie malnutrition: Secondary | ICD-10-CM | POA: Diagnosis not present

## 2020-12-18 DIAGNOSIS — I25110 Atherosclerotic heart disease of native coronary artery with unstable angina pectoris: Secondary | ICD-10-CM | POA: Diagnosis not present

## 2020-12-23 ENCOUNTER — Ambulatory Visit: Payer: Medicare HMO | Admitting: Endocrinology

## 2021-01-18 ENCOUNTER — Other Ambulatory Visit: Payer: Self-pay

## 2021-02-14 ENCOUNTER — Other Ambulatory Visit: Payer: Self-pay

## 2021-02-22 ENCOUNTER — Other Ambulatory Visit: Payer: Self-pay

## 2021-02-22 ENCOUNTER — Ambulatory Visit (INDEPENDENT_AMBULATORY_CARE_PROVIDER_SITE_OTHER): Payer: Medicare HMO | Admitting: Endocrinology

## 2021-02-22 VITALS — BP 168/82 | HR 68 | Ht 64.0 in | Wt 133.0 lb

## 2021-02-22 DIAGNOSIS — E119 Type 2 diabetes mellitus without complications: Secondary | ICD-10-CM | POA: Diagnosis not present

## 2021-02-22 LAB — POCT GLYCOSYLATED HEMOGLOBIN (HGB A1C): Hemoglobin A1C: 14.3 % — AB (ref 4.0–5.6)

## 2021-02-22 MED ORDER — NOVOLIN N FLEXPEN 100 UNIT/ML ~~LOC~~ SUPN
40.0000 [IU] | PEN_INJECTOR | SUBCUTANEOUS | 3 refills | Status: DC
  Filled 2021-02-22: qty 15, 37d supply, fill #0

## 2021-02-22 MED ORDER — FREESTYLE LIBRE 2 SENSOR MISC
1.0000 | 3 refills | Status: DC
  Filled 2021-02-22: qty 6, fill #0

## 2021-02-22 MED ORDER — FREESTYLE LIBRE 2 READER DEVI
1.0000 | Freq: Once | 1 refills | Status: AC
  Filled 2021-02-22: qty 1, 30d supply, fill #0

## 2021-02-22 NOTE — Patient Instructions (Addendum)
check your blood sugar twice a day.  vary the time of day when you check, between before the 3 meals, and at bedtime.  also check if you have symptoms of your blood sugar being too high or too low.  please keep a record of the readings and bring it to your next appointment here (or you can bring the meter itself).  You can write it on any piece of paper.  please call us sooner if your blood sugar goes below 70, or if most of your readings are over 200. I have sent a prescription to your pharmacy, to change the lantus to NPH, 40 units each morning.  Please see a diabetes educator, to learn about this.   Please come back for a follow-up appointment in 1 month.    ki?m tra l??ng ???ng trong mu c?a b?n hai l?n m?t ngy. thay ??i th?i gian trong ngy khi b?n ki?m tra, gi?a tr??c 3 b?a ?n v tr??c khi ?i ng?. ??ng th?i ki?m tra xem b?n c cc tri?u ch?ng c?a l??ng ???ng trong mu qu cao ho?c qu th?p hay khng. vui lng ghi l?i cc l?n ??c v mang ??n cu?c h?n ti?p theo c?a b?n t?i ?y (ho?c b?n c th? t? mang theo my ?o). B?n c th? vi?t n trn b?t k? m?nh gi?y no. vui lng g?i cho chng ti s?m h?n n?u l??ng ???ng trong mu c?a b?n xu?ng d??i 70 ho?c n?u h?u h?t cc ch? s? c?a b?n ??u trn 200. Ti ? g?i ??n thu?c ??n nh thu?c c?a b?n, ?? ??i lantus sang NPH, 40 ??n v? m?i sng. Xin vui lng g?p m?t nh gio d?c b?nh ti?u ???ng, ?? tm hi?u v? ?i?u ny. Xin h?n ti khm sau 1 thng.

## 2021-02-22 NOTE — Progress Notes (Signed)
Subjective:    Patient ID: RONDALE NIES, male    DOB: [birth year], 71 y.o.   MRN: [id number]  HPI Pt returns for f/u of diabetes mellitus: DM type: Insulin-requiring type 2 Dx'ed: 7829 Complications: PN, PAD, and CAD Therapy: insulin since 2021, and farxiga.   DKA: never Severe hypoglycemia: never.   Pancreatitis: never Pancreatic imaging: normal on 2012 CT SDOH: he gets meds from [facility] Other: due to noncompliance, he is not a candidate for multiple daily injections.   Interval history: he has increased the Lantus to 40 units qam.  Pt says he never misses.  no cbg record, but states cbg's vary from 70-180.  It is in general higher as the day goes on.   Past Medical History:  Diagnosis Date   Arrhythmia    CAD (coronary artery disease)    stent x 2   Carotid artery occlusion    Diabetes mellitus    Dyslipidemia    Essential hypertension     Past Surgical History:  Procedure Laterality Date   CARDIAC CATHETERIZATION     01/2010 ; 04/2010   CORONARY ARTERY BYPASS GRAFT N/A 08/23/2020   Procedure: CORONARY ARTERY BYPASS GRAFTING (CABG) x 3 ON CARDIOPULMONARY BYPASS USING EVH AND PARTIALLY OPEN HARVESTED LEFT GREATER SAPHENOUS VEIN AND LEFT INTERNAL MAMMARY ARTERY.  LIMA TO LAD, SVG TO OM, SVG TO PDA;  Surgeon: Lajuana Matte, MD;  Location: [place];  Service: Open Heart Surgery;  Laterality: N/A;   RIGHT/LEFT HEART CATH AND CORONARY ANGIOGRAPHY N/A 08/22/2020   Procedure: RIGHT/LEFT HEART CATH AND CORONARY ANGIOGRAPHY;  Surgeon: Nigel Mormon, MD;  Location: [place] [facility];  Service: Cardiovascular;  Laterality: N/A;   TEE WITHOUT CARDIOVERSION N/A 08/23/2020   Procedure: TRANSESOPHAGEAL ECHOCARDIOGRAM (TEE);  Surgeon: Lajuana Matte, MD;  Location: [place];  Service: Open Heart Surgery;  Laterality: N/A;   TEMPORARY PACEMAKER N/A 08/22/2020   Procedure: TEMPORARY PACEMAKER;  Surgeon: Nigel Mormon, MD;  Location: [place] [facility];  Service: Cardiovascular;   Laterality: N/A;   VEIN HARVEST Left 08/23/2020   Procedure: ENDOVEIN HARVEST AND PARTIALLY OPEN GREATER SAPHENOUS VEIN HARVESTING;  Surgeon: Lajuana Matte, MD;  Location: [place];  Service: Open Heart Surgery;  Laterality: Left;    Social History   Socioeconomic History   Marital status: Married    Spouse name: Not on file   Number of children: Not on file   Years of education: Not on file   Highest education level: Not on file  Occupational History   Not on file  Tobacco Use   Smoking status: Former    Packs/day: 0.25    Years: 50.00    Pack years: 12.50    Types: Cigarettes    Quit date: 08/2020    Years since quitting: 0.5   Smokeless tobacco: Never  Vaping Use   Vaping Use: Never used  Substance and Sexual Activity   Alcohol use: No   Drug use: No   Sexual activity: Not on file  Other Topics Concern   Not on file  Social History Narrative   Not on file   Social Determinants of Health   Financial Resource Strain: Not on file  Food Insecurity: Not on file  Transportation Needs: Not on file  Physical Activity: Not on file  Stress: Not on file  Social Connections: Not on file  Intimate Partner Violence: Not on file    Current Outpatient Medications on File Prior to Visit  Medication Sig Dispense Refill   Accu-Chek Softclix Lancets lancets Use to check blood sugar three times daily. 100 each 6   aspirin EC 81 MG tablet Take 1 tablet (81 mg total) by mouth daily. Swallow whole. 90 tablet 3   bisoprolol (ZEBETA) 5 MG tablet Take 1 tablet (5 mg total) by mouth daily. 90 tablet 3   bisoprolol (ZEBETA) 5 MG tablet take 1 tablet by mouth daily 90 tablet 3   Blood Glucose Monitoring Suppl (ACCU-CHEK GUIDE) w/Device KIT USE AS DIRECTED 1 kit 0   clopidogrel (PLAVIX) 75 MG tablet Take 1 tablet (75 mg total) by mouth daily. 90 tablet 3   clopidogrel (PLAVIX) 75 MG tablet take 1 tablet by mouth daily 90 tablet 3   dapagliflozin propanediol (FARXIGA) 10 MG TABS tablet  Take 1 tablet (10 mg total) by mouth daily. 30 tablet 1   gabapentin (NEURONTIN) 100 MG capsule TAKE 1 CAPSULE (100 MG TOTAL) BY MOUTH 3 (THREE) TIMES DAILY. 270 capsule 1   glucose blood (ACCU-CHEK GUIDE) test strip Use twice daily. And lancets 2/day 100 each 6   Insulin Pen Needle (TECHLITE PEN NEEDLES) 32G X 4 MM MISC Use as instructed to inject Lantus and Victoza once daily. 200 each 6   ivabradine (CORLANOR) 5 MG TABS tablet Take 1 tablet (5 mg total) by mouth 2 (two) times daily with a meal. 60 tablet 0   midodrine (PROAMATINE) 2.5 MG tablet Take 1 tablet (2.5 mg total) by mouth 3 (three) times daily with meals. 90 tablet 1   nitroGLYCERIN (NITROSTAT) 0.4 MG SL tablet Place 1 tab (0.69m total) under the tongue every 5 minutes as needed for chest pain 30 tablet 3   oxyCODONE (OXY IR/ROXICODONE) 5 MG immediate release tablet Take 1 tablet (5 mg total) by mouth every 6 (six) hours as needed for severe pain. 30 tablet 0   pantoprazole (PROTONIX) 20 MG tablet Take 1 tablet (20 mg total) by mouth daily. 90 tablet 3   pantoprazole (PROTONIX) 20 MG tablet Take 1 tab by mouth daily 90 tablet 3   rosuvastatin (CRESTOR) 40 MG tablet Take 1 tablet (40 mg total) by mouth at bedtime. 90 tablet 3   rosuvastatin (CRESTOR) 40 MG tablet take 1 tab by mouth at bedtime 90 tablet 3   sacubitril-valsartan (ENTRESTO) 24-26 MG Take 1 tablet by mouth 2 (two) times daily. 60 tablet 2   spironolactone (ALDACTONE) 25 MG tablet Take 0.5 tablets (12.5 mg total) by mouth daily. 30 tablet 1   nitroGLYCERIN (NITROSTAT) 0.4 MG SL tablet Place 1 tablet (0.4 mg total) under the tongue every 5 (five) minutes as needed for chest pain. 30 tablet 3   No current facility-administered medications on file prior to visit.    No Known Allergies  Family History  Problem Relation Age of Onset   Diabetes Neg Hx     BP (!) 168/82    Pulse 68    Ht 5' 4"  (1.626 m)    Wt 133 lb (60.3 kg)    SpO2 97%    BMI 22.83 kg/m    Review of  Systems Denies N/V    Objective:   Physical Exam     Lab Results  Component Value Date   HGBA1C 14.3 (A) 02/22/2021     Assessment & Plan:  Insulin-requiring type 2 DM: uncontrolled.    Patient Instructions  check your blood sugar twice a day.  vary the time of day when you check, between before  the 3 meals, and at bedtime.  also check if you have symptoms of your blood sugar being too high or too low.  please keep a record of the readings and bring it to your next appointment here (or you can bring the meter itself).  You can write it on any piece of paper.  please call us sooner if your blood sugar goes below 70, or if most of your readings are over 200. I have sent a prescription to your pharmacy, to change the lantus to NPH, 40 units each morning.  Please see a diabetes educator, to learn about this.   Please come back for a follow-up appointment in 1 month.    ki?m tra l??ng ???ng trong mu c?a b?n hai l?n m?t ngy. thay ??i th?i gian trong ngy khi b?n ki?m tra, gi?a tr??c 3 b?a ?n v tr??c khi ?i ng?. ??ng th?i ki?m tra xem b?n c cc tri?u ch?ng c?a l??ng ???ng trong mu qu cao ho?c qu th?p hay khng. vui lng ghi l?i cc l?n ??c v mang ??n cu?c h?n ti?p theo c?a b?n t?i ?y (ho?c b?n c th? t? mang theo my ?o). B?n c th? vi?t n trn b?t k? m?nh gi?y no. vui lng g?i cho chng ti s?m h?n n?u l??ng ???ng trong mu c?a b?n xu?ng d??i 70 ho?c n?u h?u h?t cc ch? s? c?a b?n ??u trn 200. Ti ? g?i ??n thu?c ??n nh thu?c c?a b?n, ?? ??i lantus sang NPH, 40 ??n v? m?i sng. Xin vui lng g?p m?t nh gio d?c b?nh ti?u ???ng, ?? tm hi?u v? ?i?u ny. Xin h?n ti khm sau 1 thng.

## 2021-02-23 ENCOUNTER — Other Ambulatory Visit: Payer: Self-pay

## 2021-03-29 ENCOUNTER — Other Ambulatory Visit: Payer: Self-pay

## 2021-03-29 ENCOUNTER — Encounter: Payer: Medicare HMO | Attending: Endocrinology | Admitting: Nutrition

## 2021-03-29 ENCOUNTER — Ambulatory Visit (INDEPENDENT_AMBULATORY_CARE_PROVIDER_SITE_OTHER): Payer: Medicare HMO | Admitting: Endocrinology

## 2021-03-29 VITALS — BP 124/90 | HR 73 | Ht 64.0 in | Wt 128.0 lb

## 2021-03-29 DIAGNOSIS — E119 Type 2 diabetes mellitus without complications: Secondary | ICD-10-CM | POA: Insufficient documentation

## 2021-03-29 DIAGNOSIS — Z713 Dietary counseling and surveillance: Secondary | ICD-10-CM | POA: Insufficient documentation

## 2021-03-29 LAB — POCT GLYCOSYLATED HEMOGLOBIN (HGB A1C): Hemoglobin A1C: 15 % — AB (ref 4.0–5.6)

## 2021-03-29 MED ORDER — NOVOLIN N FLEXPEN 100 UNIT/ML ~~LOC~~ SUPN
40.0000 [IU] | PEN_INJECTOR | SUBCUTANEOUS | 3 refills | Status: DC
  Filled 2021-03-29: qty 15, 37d supply, fill #0

## 2021-03-29 NOTE — Progress Notes (Signed)
Subjective:    Patient ID: Colin [name], male    DOB: 04-Jun-1950, 71 y.o.   MRN: [id number]  HPI Pt returns for f/u of diabetes mellitus:  DM type: Insulin-requiring type 2 Dx'ed: 0300 Complications: PN, PAD, and CAD Therapy: insulin since 2021, and farxiga.   DKA: never Severe hypoglycemia: never.   Pancreatitis: never Pancreatic imaging: normal on 2012 CT SDOH: he gets meds from [facility] Other: due to noncompliance, he is not a candidate for multiple daily injections.   Interval history: pt insists he takes insulin as rx'ed.  he does not check cbg.  Denies insulin leakage from the inject site.  He reports fatigue.  Past Medical History:  Diagnosis Date   Arrhythmia    CAD (coronary artery disease)    stent x 2   Carotid artery occlusion    Diabetes mellitus    Dyslipidemia    Essential hypertension     Past Surgical History:  Procedure Laterality Date   CARDIAC CATHETERIZATION     01/2010 ; 04/2010   CORONARY ARTERY BYPASS GRAFT N/A 08/23/2020   Procedure: CORONARY ARTERY BYPASS GRAFTING (CABG) x 3 ON CARDIOPULMONARY BYPASS USING EVH AND PARTIALLY OPEN HARVESTED LEFT GREATER SAPHENOUS VEIN AND LEFT INTERNAL MAMMARY ARTERY.  LIMA TO LAD, SVG TO OM, SVG TO PDA;  Surgeon: Lajuana Matte, MD;  Location: [place];  Service: Open Heart Surgery;  Laterality: N/A;   RIGHT/LEFT HEART CATH AND CORONARY ANGIOGRAPHY N/A 08/22/2020   Procedure: RIGHT/LEFT HEART CATH AND CORONARY ANGIOGRAPHY;  Surgeon: Nigel Mormon, MD;  Location: [place] [facility];  Service: Cardiovascular;  Laterality: N/A;   TEE WITHOUT CARDIOVERSION N/A 08/23/2020   Procedure: TRANSESOPHAGEAL ECHOCARDIOGRAM (TEE);  Surgeon: Lajuana Matte, MD;  Location: [place];  Service: Open Heart Surgery;  Laterality: N/A;   TEMPORARY PACEMAKER N/A 08/22/2020   Procedure: TEMPORARY PACEMAKER;  Surgeon: Nigel Mormon, MD;  Location: [place] [facility];  Service: Cardiovascular;  Laterality: N/A;   VEIN HARVEST Left  08/23/2020   Procedure: ENDOVEIN HARVEST AND PARTIALLY OPEN GREATER SAPHENOUS VEIN HARVESTING;  Surgeon: Lajuana Matte, MD;  Location: [place];  Service: Open Heart Surgery;  Laterality: Left;    Social History   Socioeconomic History   Marital status: Married    Spouse name: Not on file   Number of children: Not on file   Years of education: Not on file   Highest education level: Not on file  Occupational History   Not on file  Tobacco Use   Smoking status: Former    Packs/day: 0.25    Years: 50.00    Pack years: 12.50    Types: Cigarettes    Quit date: 08/2020    Years since quitting: 0.6   Smokeless tobacco: Never  Vaping Use   Vaping Use: Never used  Substance and Sexual Activity   Alcohol use: No   Drug use: No   Sexual activity: Not on file  Other Topics Concern   Not on file  Social History Narrative   Not on file   Social Determinants of Health   Financial Resource Strain: Not on file  Food Insecurity: Not on file  Transportation Needs: Not on file  Physical Activity: Not on file  Stress: Not on file  Social Connections: Not on file  Intimate Partner Violence: Not on file    Current Outpatient Medications on File Prior to Visit  Medication Sig Dispense Refill   Accu-Chek Softclix Lancets lancets Use  to check blood sugar three times daily. 100 each 6   aspirin EC 81 MG tablet Take 1 tablet (81 mg total) by mouth daily. Swallow whole. 90 tablet 3   bisoprolol (ZEBETA) 5 MG tablet Take 1 tablet (5 mg total) by mouth daily. 90 tablet 3   bisoprolol (ZEBETA) 5 MG tablet take 1 tablet by mouth daily 90 tablet 3   Blood Glucose Monitoring Suppl (ACCU-CHEK GUIDE) w/Device KIT USE AS DIRECTED 1 kit 0   clopidogrel (PLAVIX) 75 MG tablet Take 1 tablet (75 mg total) by mouth daily. 90 tablet 3   clopidogrel (PLAVIX) 75 MG tablet take 1 tablet by mouth daily 90 tablet 3   Continuous Blood Gluc Sensor (FREESTYLE LIBRE 2 SENSOR) MISC 1 Device by Does not apply route  every 14 (fourteen) days. 6 each 3   dapagliflozin propanediol (FARXIGA) 10 MG TABS tablet Take 1 tablet (10 mg total) by mouth daily. 30 tablet 1   gabapentin (NEURONTIN) 100 MG capsule TAKE 1 CAPSULE (100 MG TOTAL) BY MOUTH 3 (THREE) TIMES DAILY. 270 capsule 1   glucose blood (ACCU-CHEK GUIDE) test strip Use twice daily. And lancets 2/day 100 each 6   Insulin Pen Needle (TECHLITE PEN NEEDLES) 32G X 4 MM MISC Use as instructed to inject Lantus and Victoza once daily. 200 each 6   ivabradine (CORLANOR) 5 MG TABS tablet Take 1 tablet (5 mg total) by mouth 2 (two) times daily with a meal. 60 tablet 0   midodrine (PROAMATINE) 2.5 MG tablet Take 1 tablet (2.5 mg total) by mouth 3 (three) times daily with meals. 90 tablet 1   nitroGLYCERIN (NITROSTAT) 0.4 MG SL tablet Place 1 tablet (0.4 mg total) under the tongue every 5 (five) minutes as needed for chest pain. 30 tablet 3   nitroGLYCERIN (NITROSTAT) 0.4 MG SL tablet Place 1 tab (0.69m total) under the tongue every 5 minutes as needed for chest pain 30 tablet 3   oxyCODONE (OXY IR/ROXICODONE) 5 MG immediate release tablet Take 1 tablet (5 mg total) by mouth every 6 (six) hours as needed for severe pain. 30 tablet 0   pantoprazole (PROTONIX) 20 MG tablet Take 1 tablet (20 mg total) by mouth daily. 90 tablet 3   pantoprazole (PROTONIX) 20 MG tablet Take 1 tab by mouth daily 90 tablet 3   rosuvastatin (CRESTOR) 40 MG tablet Take 1 tablet (40 mg total) by mouth at bedtime. 90 tablet 3   rosuvastatin (CRESTOR) 40 MG tablet take 1 tab by mouth at bedtime 90 tablet 3   sacubitril-valsartan (ENTRESTO) 24-26 MG Take 1 tablet by mouth 2 (two) times daily. 60 tablet 2   spironolactone (ALDACTONE) 25 MG tablet Take 0.5 tablets (12.5 mg total) by mouth daily. 30 tablet 1   No current facility-administered medications on file prior to visit.    No Known Allergies  Family History  Problem Relation Age of Onset   Diabetes Neg Hx     BP 124/90    Pulse 73     Ht _0  (1.626 m)    Wt 128 lb (58.1 kg)    SpO2 99%    BMI 21.97 kg/m    Review of Systems Denies N/V/sob.     Objective:   Physical Exam    Lab Results  Component Value Date   HGBA1C 15.0 (A) 03/29/2021   Lab Results  Component Value Date   CREATININE 1.03 11/11/2020   BUN 17 11/11/2020   NA 143 11/11/2020  K 3.9 11/11/2020   CL 105 11/11/2020   CO2 25 11/11/2020      Assessment & Plan:  Insulin-requiring type 2 DM: uncontrolled  Patient Instructions  check your blood sugar twice a day.  vary the time of day when you check, between before the 3 meals, and at bedtime.  also check if you have symptoms of your blood sugar being too high or too low.  please keep a record of the readings and bring it to your next appointment here (or you can bring the meter itself).  You can write it on any piece of paper.  please call us sooner if your blood sugar goes below 70, or if most of your readings are over 200. Please continue the same insulin.  We are placing a continuous glucose monitor sensor today.  Make sure you swirl the insulin pen prior to injecting.   A different type of diabetes blood test is requested for you again today.  We'll let you know about the results.    Please come back for a follow-up appointment in 2 weeks.    ki?m tra l??ng ???ng trong mu c?a b?n hai l?n m?t ngy. thay ??i th?i gian trong ngy khi b?n ki?m tra, gi?a tr??c 3 b?a ?n v tr??c khi ?i ng?. ??ng th?i ki?m tra xem b?n c cc tri?u ch?ng c?a l??ng ???ng trong mu qu cao ho?c qu th?p hay khng. vui lng ghi l?i cc l?n ??c v mang ??n cu?c h?n ti?p theo c?a b?n t?i ?y (ho?c b?n c th? t? mang theo my ?o). B?n c th? vi?t n trn b?t k? m?nh gi?y no. vui lng g?i cho chng ti s?m h?n n?u l??ng ???ng trong mu c?a b?n xu?ng d??i 70 ho?c n?u h?u h?t cc ch? s? c?a b?n ??u trn 200. Hy ti?p t?c cng m?t lo?i insulin. Hm nay chng ti ?ang ??t m?t c?m bi?n theo di glucose lin t?c. Hy ch?c ch?n  r?ng b?n xoay bt insulin tr??c khi tim. M?t lo?i xt nghi?m mu b?nh ti?u ???ng khc ???c yu c?u cho b?n m?t l?n n?a ngy hm nay. Chng ti s? cho b?n bi?t v? k?t qu?Marland Kitchen Xin h?n ti khm sau 2 tu?n.

## 2021-03-29 NOTE — Patient Instructions (Addendum)
check your blood sugar twice a day.  vary the time of day when you check, between before the 3 meals, and at bedtime.  also check if you have symptoms of your blood sugar being too high or too low.  please keep a record of the readings and bring it to your next appointment here (or you can bring the meter itself).  You can write it on any piece of paper.  please call us sooner if your blood sugar goes below 70, or if most of your readings are over 200. Please continue the same insulin.  We are placing a continuous glucose monitor sensor today.  Make sure you swirl the insulin pen prior to injecting.   A different type of diabetes blood test is requested for you again today.  We'll let you know about the results.    Please come back for a follow-up appointment in 2 weeks.    ki?m tra l??ng ???ng trong mu c?a b?n hai l?n m?t ngy. thay ??i th?i gian trong ngy khi b?n ki?m tra, gi?a tr??c 3 b?a ?n v tr??c khi ?i ng?. ??ng th?i ki?m tra xem b?n c cc tri?u ch?ng c?a l??ng ???ng trong mu qu cao ho?c qu th?p hay khng. vui lng ghi l?i cc l?n ??c v mang ??n cu?c h?n ti?p theo c?a b?n t?i ?y (ho?c b?n c th? t? mang theo my ?o). B?n c th? vi?t n trn b?t k? m?nh gi?y no. vui lng g?i cho chng ti s?m h?n n?u l??ng ???ng trong mu c?a b?n xu?ng d??i 70 ho?c n?u h?u h?t cc ch? s? c?a b?n ??u trn 200. Hy ti?p t?c cng m?t lo?i insulin. Hm nay chng ti ?ang ??t m?t c?m bi?n theo di glucose lin t?c. Hy ch?c ch?n r?ng b?n xoay bt insulin tr??c khi tim. M?t lo?i xt nghi?m mu b?nh ti?u ???ng khc ???c yu c?u cho b?n m?t l?n n?a ngy hm nay. Chng ti s? cho b?n bi?t v? k?t qu?Marland Kitchen Xin h?n ti khm sau 2 tu?n.

## 2021-03-30 ENCOUNTER — Other Ambulatory Visit: Payer: Self-pay

## 2021-04-03 LAB — FRUCTOSAMINE: Fructosamine: 449 umol/L — ABNORMAL HIGH (ref 205–285)

## 2021-04-09 NOTE — Progress Notes (Signed)
Patient is here with an interpretor to review his diet.

## 2021-04-12 ENCOUNTER — Other Ambulatory Visit: Payer: Self-pay

## 2021-04-12 ENCOUNTER — Ambulatory Visit (INDEPENDENT_AMBULATORY_CARE_PROVIDER_SITE_OTHER): Payer: Medicare HMO | Admitting: Endocrinology

## 2021-04-12 VITALS — BP 140/62 | HR 62 | Ht 64.0 in | Wt 130.8 lb

## 2021-04-12 DIAGNOSIS — Z794 Long term (current) use of insulin: Secondary | ICD-10-CM

## 2021-04-12 DIAGNOSIS — E1142 Type 2 diabetes mellitus with diabetic polyneuropathy: Secondary | ICD-10-CM | POA: Diagnosis not present

## 2021-04-12 MED ORDER — NOVOLIN 70/30 FLEXPEN (70-30) 100 UNIT/ML ~~LOC~~ SUPN
46.0000 [IU] | PEN_INJECTOR | Freq: Every day | SUBCUTANEOUS | 3 refills | Status: DC
Start: 2021-04-12 — End: 2021-05-03
  Filled 2021-04-12: qty 15, 32d supply, fill #0

## 2021-04-12 NOTE — Patient Instructions (Addendum)
I have sent a prescription to your pharmacy, to change the insulin to "70/30," 46 units each morning.   ?Please come back for a follow-up appointment in 1 month.  ? ? ?T?i ?? g?i ??n thu?c ??n hi?u thu?c c?a b?n, ?? ??i insulin th?nh "70/30," 46 ??n v? m?i s?ng. ?Xin h?n t?i kh?m sau 1 th?ng.   ? ? ? ? ? ? ? ?

## 2021-04-12 NOTE — Progress Notes (Signed)
? ?Subjective:  ? ? Patient ID: Colin [name], male    DOB: [birth year], 71 y.o.   MRN: [id number] ? ?HPI ?M. Dega interpreter: ?Pt returns for f/u of diabetes mellitus:  ?DM type: Insulin-requiring type 2 ?Dx'ed: 2006 ?Complications: PN, PAD, and CAD ?Therapy: insulin since 2021, and farxiga.   ?DKA: never ?Severe hypoglycemia: never.   ?Pancreatitis: never ?Pancreatic imaging: normal on 2012 CT ?SDOH: he gets meds from [facility]; he does not check cbg.  ?Other: due to noncompliance, he is not a candidate for multiple daily injections.   ?Interval history: he takes insulin as rx'ed.  I reviewed continuous glucose monitor data.  Glucose varies from 160-400.  It is in general lowest at [place], and higher at all other times of day.  It decreases overnight.   ?Past Medical History:  ?Diagnosis Date  ? Arrhythmia   ? CAD (coronary artery disease)   ? stent x 2  ? Carotid artery occlusion   ? Diabetes mellitus   ? Dyslipidemia   ? Essential hypertension   ? ? ?Past Surgical History:  ?Procedure Laterality Date  ? CARDIAC CATHETERIZATION    ? 01/2010 ; 04/2010  ? CORONARY ARTERY BYPASS GRAFT N/A 08/23/2020  ? Procedure: CORONARY ARTERY BYPASS GRAFTING (CABG) x 3 ON CARDIOPULMONARY BYPASS USING EVH AND PARTIALLY OPEN HARVESTED LEFT GREATER SAPHENOUS VEIN AND LEFT INTERNAL MAMMARY ARTERY.  LIMA TO LAD, SVG TO OM, SVG TO PDA;  Surgeon: Lajuana Matte, MD;  Location: [place];  Service: Open Heart Surgery;  Laterality: N/A;  ? RIGHT/LEFT HEART CATH AND CORONARY ANGIOGRAPHY N/A 08/22/2020  ? Procedure: RIGHT/LEFT HEART CATH AND CORONARY ANGIOGRAPHY;  Surgeon: Nigel Mormon, MD;  Location: [place] [facility];  Service: Cardiovascular;  Laterality: N/A;  ? TEE WITHOUT CARDIOVERSION N/A 08/23/2020  ? Procedure: TRANSESOPHAGEAL ECHOCARDIOGRAM (TEE);  Surgeon: Lajuana Matte, MD;  Location: [place];  Service: Open Heart Surgery;  Laterality: N/A;  ? TEMPORARY PACEMAKER N/A 08/22/2020  ? Procedure: TEMPORARY PACEMAKER;  Surgeon:  Nigel Mormon, MD;  Location: [place] [facility];  Service: Cardiovascular;  Laterality: N/A;  ? VEIN HARVEST Left 08/23/2020  ? Procedure: ENDOVEIN HARVEST AND PARTIALLY OPEN GREATER SAPHENOUS VEIN HARVESTING;  Surgeon: Lajuana Matte, MD;  Location: [place];  Service: Open Heart Surgery;  Laterality: Left;  ? ? ?Social History  ? ?Socioeconomic History  ? Marital status: Married  ?  Spouse name: Not on file  ? Number of children: Not on file  ? Years of education: Not on file  ? Highest education level: Not on file  ?Occupational History  ? Not on file  ?Tobacco Use  ? Smoking status: Former  ?  Packs/day: 0.25  ?  Years: 50.00  ?  Pack years: 12.50  ?  Types: Cigarettes  ?  Quit date: 08/2020  ?  Years since quitting: 0.6  ? Smokeless tobacco: Never  ?Vaping Use  ? Vaping Use: Never used  ?Substance and Sexual Activity  ? Alcohol use: No  ? Drug use: No  ? Sexual activity: Not on file  ?Other Topics Concern  ? Not on file  ?Social History Narrative  ? Not on file  ? ?Social Determinants of Health  ? ?Financial Resource Strain: Not on file  ?Food Insecurity: Not on file  ?Transportation Needs: Not on file  ?Physical Activity: Not on file  ?Stress: Not on file  ?Social Connections: Not on file  ?Intimate Partner Violence: Not on file  ? ? ?  Current Outpatient Medications on File Prior to Visit  ?Medication Sig Dispense Refill  ? Accu-Chek Softclix Lancets lancets Use to check blood sugar three times daily. 100 each 6  ? bisoprolol (ZEBETA) 5 MG tablet Take 1 tablet (5 mg total) by mouth daily. 90 tablet 3  ? bisoprolol (ZEBETA) 5 MG tablet take 1 tablet by mouth daily 90 tablet 3  ? Blood Glucose Monitoring Suppl (ACCU-CHEK GUIDE) w/Device KIT USE AS DIRECTED 1 kit 0  ? clopidogrel (PLAVIX) 75 MG tablet Take 1 tablet (75 mg total) by mouth daily. 90 tablet 3  ? clopidogrel (PLAVIX) 75 MG tablet take 1 tablet by mouth daily 90 tablet 3  ? Continuous Blood Gluc Sensor (FREESTYLE LIBRE 2 SENSOR) MISC 1 Device  by Does not apply route every 14 (fourteen) days. 6 each 3  ? dapagliflozin propanediol (FARXIGA) 10 MG TABS tablet Take 1 tablet (10 mg total) by mouth daily. 30 tablet 1  ? gabapentin (NEURONTIN) 100 MG capsule TAKE 1 CAPSULE (100 MG TOTAL) BY MOUTH 3 (THREE) TIMES DAILY. 270 capsule 1  ? glucose blood (ACCU-CHEK GUIDE) test strip Use twice daily. And lancets 2/day 100 each 6  ? Insulin Pen Needle (TECHLITE PEN NEEDLES) 32G X 4 MM MISC Use as instructed to inject Lantus and Victoza once daily. 200 each 6  ? ivabradine (CORLANOR) 5 MG TABS tablet Take 1 tablet (5 mg total) by mouth 2 (two) times daily with a meal. 60 tablet 0  ? midodrine (PROAMATINE) 2.5 MG tablet Take 1 tablet (2.5 mg total) by mouth 3 (three) times daily with meals. 90 tablet 1  ? nitroGLYCERIN (NITROSTAT) 0.4 MG SL tablet Place 1 tab (0.65m total) under the tongue every 5 minutes as needed for chest pain 30 tablet 3  ? oxyCODONE (OXY IR/ROXICODONE) 5 MG immediate release tablet Take 1 tablet (5 mg total) by mouth every 6 (six) hours as needed for severe pain. 30 tablet 0  ? pantoprazole (PROTONIX) 20 MG tablet Take 1 tablet (20 mg total) by mouth daily. 90 tablet 3  ? pantoprazole (PROTONIX) 20 MG tablet Take 1 tab by mouth daily 90 tablet 3  ? rosuvastatin (CRESTOR) 40 MG tablet Take 1 tablet (40 mg total) by mouth at bedtime. 90 tablet 3  ? rosuvastatin (CRESTOR) 40 MG tablet take 1 tab by mouth at bedtime 90 tablet 3  ? sacubitril-valsartan (ENTRESTO) 24-26 MG Take 1 tablet by mouth 2 (two) times daily. 60 tablet 2  ? spironolactone (ALDACTONE) 25 MG tablet Take 0.5 tablets (12.5 mg total) by mouth daily. 30 tablet 1  ? nitroGLYCERIN (NITROSTAT) 0.4 MG SL tablet Place 1 tablet (0.4 mg total) under the tongue every 5 (five) minutes as needed for chest pain. 30 tablet 3  ? ?No current facility-administered medications on file prior to visit.  ? ? ?No Known Allergies ? ?Family History  ?Problem Relation Age of Onset  ? Diabetes Neg Hx   ? ? ?BP  140/62   Pulse 62   Ht 5' 4"  (1.626 m)   Wt 130 lb 12.8 oz (59.3 kg)   SpO2 98%   BMI 22.45 kg/m?  ? ? ?Review of Systems ? ?   ?Objective:  ? Physical Exam ?VITAL SIGNS:  See vs page ?GENERAL: no distress.   ? ? ? ?   ?Assessment & Plan:  ?Insulin-requiring type 2 DM: uncontrolled.  Based on the pattern of her cbg's, he needs a faster-acting qam insulin ? ?Patient Instructions  ?I  have sent a prescription to your pharmacy, to change the insulin to "70/30," 46 units each morning.   ?Please come back for a follow-up appointment in 1 month.  ? ? ?T?i ?? g?i ??n thu?c ??n hi?u thu?c c?a b?n, ?? ??i insulin th?nh "70/30," 46 ??n v? m?i s?ng. ?Xin h?n t?i kh?m sau 1 th?ng.   ? ? ? ? ? ? ? ? ? ?

## 2021-05-03 ENCOUNTER — Other Ambulatory Visit: Payer: Self-pay

## 2021-05-03 ENCOUNTER — Ambulatory Visit: Payer: Medicare HMO | Admitting: Endocrinology

## 2021-05-03 ENCOUNTER — Encounter: Payer: Self-pay | Admitting: Endocrinology

## 2021-05-03 VITALS — BP 130/78 | HR 67 | Ht 64.0 in | Wt 136.0 lb

## 2021-05-03 DIAGNOSIS — E1142 Type 2 diabetes mellitus with diabetic polyneuropathy: Secondary | ICD-10-CM

## 2021-05-03 DIAGNOSIS — Z794 Long term (current) use of insulin: Secondary | ICD-10-CM | POA: Diagnosis not present

## 2021-05-03 MED ORDER — ACCU-CHEK GUIDE VI STRP
1.0000 | ORAL_STRIP | Freq: Two times a day (BID) | 3 refills | Status: DC
  Filled 2021-05-03: qty 100, 50d supply, fill #0

## 2021-05-03 MED ORDER — NOVOLIN 70/30 FLEXPEN (70-30) 100 UNIT/ML ~~LOC~~ SUPN
46.0000 [IU] | PEN_INJECTOR | Freq: Every day | SUBCUTANEOUS | 3 refills | Status: DC
  Filled 2021-05-03: qty 60, 130d supply, fill #0
  Filled 2021-06-19: qty 42, 90d supply, fill #0

## 2021-05-03 NOTE — Patient Instructions (Addendum)
Blood tests are requested for you today.  We'll let you know about the results, by letter.   ?Please come back for a follow-up appointment in 3 months.   ? ? ? ? ? ? ? ? ?

## 2021-05-03 NOTE — Progress Notes (Signed)
? ?Subjective:  ? ? Patient ID: Colin [name], male    DOB: [birth year], 71 y.o.   MRN: [id number] ? ?HPI ?M. Dega in-person interpreter: ?Pt returns for f/u of diabetes mellitus:  ?DM type: Insulin-requiring type 2 ?Dx'ed: 2006 ?Complications: PN, PAD, and CAD ?Therapy: insulin since 2021, and farxiga.   ?DKA: never ?Severe hypoglycemia: never.   ?Pancreatitis: never ?Pancreatic imaging: normal on 2012 CT ?SDOH: he gets meds from [facility]; he does not check cbg.  ?Other: due to noncompliance, he is not a candidate for multiple daily injections; qam insulin was changed to 70/30, based on pattern of cbg's.   ?Interval history: pt says he never misses insulin.  Denies sweating/palpitations/tremor.   ?Past Medical History:  ?Diagnosis Date  ? Arrhythmia   ? CAD (coronary artery disease)   ? stent x 2  ? Carotid artery occlusion   ? Diabetes mellitus   ? Dyslipidemia   ? Essential hypertension   ? ? ?Past Surgical History:  ?Procedure Laterality Date  ? CARDIAC CATHETERIZATION    ? 01/2010 ; 04/2010  ? CORONARY ARTERY BYPASS GRAFT N/A 08/23/2020  ? Procedure: CORONARY ARTERY BYPASS GRAFTING (CABG) x 3 ON CARDIOPULMONARY BYPASS USING EVH AND PARTIALLY OPEN HARVESTED LEFT GREATER SAPHENOUS VEIN AND LEFT INTERNAL MAMMARY ARTERY.  LIMA TO LAD, SVG TO OM, SVG TO PDA;  Surgeon: Lajuana Matte, MD;  Location: [place];  Service: Open Heart Surgery;  Laterality: N/A;  ? RIGHT/LEFT HEART CATH AND CORONARY ANGIOGRAPHY N/A 08/22/2020  ? Procedure: RIGHT/LEFT HEART CATH AND CORONARY ANGIOGRAPHY;  Surgeon: Nigel Mormon, MD;  Location: [place] [facility];  Service: Cardiovascular;  Laterality: N/A;  ? TEE WITHOUT CARDIOVERSION N/A 08/23/2020  ? Procedure: TRANSESOPHAGEAL ECHOCARDIOGRAM (TEE);  Surgeon: Lajuana Matte, MD;  Location: [place];  Service: Open Heart Surgery;  Laterality: N/A;  ? TEMPORARY PACEMAKER N/A 08/22/2020  ? Procedure: TEMPORARY PACEMAKER;  Surgeon: Nigel Mormon, MD;  Location: [place] [facility];   Service: Cardiovascular;  Laterality: N/A;  ? VEIN HARVEST Left 08/23/2020  ? Procedure: ENDOVEIN HARVEST AND PARTIALLY OPEN GREATER SAPHENOUS VEIN HARVESTING;  Surgeon: Lajuana Matte, MD;  Location: [place];  Service: Open Heart Surgery;  Laterality: Left;  ? ? ?Social History  ? ?Socioeconomic History  ? Marital status: Married  ?  Spouse name: Not on file  ? Number of children: Not on file  ? Years of education: Not on file  ? Highest education level: Not on file  ?Occupational History  ? Not on file  ?Tobacco Use  ? Smoking status: Former  ?  Packs/day: 0.25  ?  Years: 50.00  ?  Pack years: 12.50  ?  Types: Cigarettes  ?  Quit date: 08/2020  ?  Years since quitting: 0.7  ? Smokeless tobacco: Never  ?Vaping Use  ? Vaping Use: Never used  ?Substance and Sexual Activity  ? Alcohol use: No  ? Drug use: No  ? Sexual activity: Not on file  ?Other Topics Concern  ? Not on file  ?Social History Narrative  ? Not on file  ? ?Social Determinants of Health  ? ?Financial Resource Strain: Not on file  ?Food Insecurity: Not on file  ?Transportation Needs: Not on file  ?Physical Activity: Not on file  ?Stress: Not on file  ?Social Connections: Not on file  ?Intimate Partner Violence: Not on file  ? ? ?Current Outpatient Medications on File Prior to Visit  ?Medication Sig Dispense Refill  ?  Accu-Chek Softclix Lancets lancets Use to check blood sugar three times daily. 100 each 6  ? bisoprolol (ZEBETA) 5 MG tablet Take 1 tablet (5 mg total) by mouth daily. 90 tablet 3  ? bisoprolol (ZEBETA) 5 MG tablet take 1 tablet by mouth daily 90 tablet 3  ? Blood Glucose Monitoring Suppl (ACCU-CHEK GUIDE) w/Device KIT USE AS DIRECTED 1 kit 0  ? clopidogrel (PLAVIX) 75 MG tablet Take 1 tablet (75 mg total) by mouth daily. 90 tablet 3  ? clopidogrel (PLAVIX) 75 MG tablet take 1 tablet by mouth daily 90 tablet 3  ? Continuous Blood Gluc Sensor (FREESTYLE LIBRE 2 SENSOR) MISC 1 Device by Does not apply route every 14 (fourteen) days. 6 each  3  ? dapagliflozin propanediol (FARXIGA) 10 MG TABS tablet Take 1 tablet (10 mg total) by mouth daily. 30 tablet 1  ? gabapentin (NEURONTIN) 100 MG capsule TAKE 1 CAPSULE (100 MG TOTAL) BY MOUTH 3 (THREE) TIMES DAILY. 270 capsule 1  ? Insulin Pen Needle (TECHLITE PEN NEEDLES) 32G X 4 MM MISC Use as instructed to inject Lantus and Victoza once daily. 200 each 6  ? ivabradine (CORLANOR) 5 MG TABS tablet Take 1 tablet (5 mg total) by mouth 2 (two) times daily with a meal. 60 tablet 0  ? midodrine (PROAMATINE) 2.5 MG tablet Take 1 tablet (2.5 mg total) by mouth 3 (three) times daily with meals. 90 tablet 1  ? nitroGLYCERIN (NITROSTAT) 0.4 MG SL tablet Place 1 tab (0.1m total) under the tongue every 5 minutes as needed for chest pain 30 tablet 3  ? oxyCODONE (OXY IR/ROXICODONE) 5 MG immediate release tablet Take 1 tablet (5 mg total) by mouth every 6 (six) hours as needed for severe pain. 30 tablet 0  ? pantoprazole (PROTONIX) 20 MG tablet Take 1 tablet (20 mg total) by mouth daily. 90 tablet 3  ? pantoprazole (PROTONIX) 20 MG tablet Take 1 tab by mouth daily 90 tablet 3  ? rosuvastatin (CRESTOR) 40 MG tablet Take 1 tablet (40 mg total) by mouth at bedtime. 90 tablet 3  ? rosuvastatin (CRESTOR) 40 MG tablet take 1 tab by mouth at bedtime 90 tablet 3  ? sacubitril-valsartan (ENTRESTO) 24-26 MG Take 1 tablet by mouth 2 (two) times daily. 60 tablet 2  ? spironolactone (ALDACTONE) 25 MG tablet Take 0.5 tablets (12.5 mg total) by mouth daily. 30 tablet 1  ? nitroGLYCERIN (NITROSTAT) 0.4 MG SL tablet Place 1 tablet (0.4 mg total) under the tongue every 5 (five) minutes as needed for chest pain. 30 tablet 3  ? ?No current facility-administered medications on file prior to visit.  ? ? ?No Known Allergies ? ?Family History  ?Problem Relation Age of Onset  ? Diabetes Neg Hx   ? ? ?BP 130/78 (BP Location: Left Arm, Patient Position: Sitting, Cuff Size: Normal)   Pulse 67   Ht 5' 4"  (1.626 m)   Wt 136 lb (61.7 kg)   SpO2 97%    BMI 23.34 kg/m?  ? ? ?Review of Systems ? ?   ?Objective:  ? Physical Exam ?VITAL SIGNS:  See vs page.   ?GENERAL: no distress.  ? ?Lab Results  ?Component Value Date  ? HGBA1C 15.0 (A) 03/29/2021  ? ?   ?Assessment & Plan:  ?Insulin-requiring type 2 DM.  Uncontrolled.   ? ?Patient Instructions  ?Blood tests are requested for you today.  We'll let you know about the results, by letter.   ?Please come back for  a follow-up appointment in 3 months.   ? ? ? ? ? ? ? ? ? ? ?

## 2021-05-07 ENCOUNTER — Encounter: Payer: Self-pay | Admitting: Endocrinology

## 2021-05-07 LAB — FRUCTOSAMINE: Fructosamine: 417 umol/L — ABNORMAL HIGH (ref 205–285)

## 2021-06-19 ENCOUNTER — Other Ambulatory Visit: Payer: Self-pay

## 2021-06-19 ENCOUNTER — Other Ambulatory Visit: Payer: Self-pay | Admitting: Pharmacist

## 2021-06-20 ENCOUNTER — Ambulatory Visit: Payer: Medicare HMO | Admitting: Endocrinology

## 2021-06-20 NOTE — Chronic Care Management (AMB) (Signed)
Patient seen by Marlou Sa, PharmD Candidate on 06/19/21 while they were picking up prescriptions at [facility].  ? ?Patient does not have an automated home blood pressure machine.  ? ?Medication review was performed. They are not taking medications as prescribed. Per conversation, patient is only taking insulin at this time.  ? ?Contacted patient to discuss barriers. Contacted via [employer]. They do not have a [place] option, so I called with a [place] interpreter. We left a message.  ? ?Will collaborate with team to attempt to contact patient.  ? ?Catie Eppie Gibson, PharmD, BCACP ?[facility] ?[phone] ? ?

## 2021-06-21 NOTE — Chronic Care Management (AMB) (Signed)
Attempted a second call. Left voicemail for patient to return at his convenience.  ? ?Catie Eppie Gibson, PharmD, BCACP ?[facility] ?[phone] ? ?

## 2021-06-26 ENCOUNTER — Other Ambulatory Visit (INDEPENDENT_AMBULATORY_CARE_PROVIDER_SITE_OTHER): Payer: Medicare HMO

## 2021-06-26 ENCOUNTER — Other Ambulatory Visit: Payer: Self-pay | Admitting: Endocrinology

## 2021-06-26 DIAGNOSIS — E1142 Type 2 diabetes mellitus with diabetic polyneuropathy: Secondary | ICD-10-CM | POA: Diagnosis not present

## 2021-06-26 DIAGNOSIS — Z794 Long term (current) use of insulin: Secondary | ICD-10-CM

## 2021-06-26 LAB — BASIC METABOLIC PANEL
BUN: 22 mg/dL (ref 6–23)
CO2: 32 mEq/L (ref 19–32)
Calcium: 8.6 mg/dL (ref 8.4–10.5)
Chloride: 104 mEq/L (ref 96–112)
Creatinine, Ser: 1.42 mg/dL (ref 0.40–1.50)
GFR: 49.82 mL/min — ABNORMAL LOW (ref >60.00)
Glucose, Bld: 246 mg/dL — ABNORMAL HIGH (ref 70–99)
Potassium: 3.8 mEq/L (ref 3.5–5.1)
Sodium: 141 mEq/L (ref 135–145)

## 2021-06-26 LAB — HEMOGLOBIN A1C: Hgb A1c MFr Bld: 13.6 % — ABNORMAL HIGH (ref 4.6–6.5)

## 2021-06-29 ENCOUNTER — Other Ambulatory Visit: Payer: Self-pay

## 2021-06-29 ENCOUNTER — Encounter: Payer: Self-pay | Admitting: Endocrinology

## 2021-06-29 ENCOUNTER — Ambulatory Visit: Payer: Medicare HMO | Admitting: Endocrinology

## 2021-06-29 VITALS — BP 170/80 | HR 75 | Ht 64.0 in | Wt 134.0 lb

## 2021-06-29 DIAGNOSIS — Z794 Long term (current) use of insulin: Secondary | ICD-10-CM | POA: Diagnosis not present

## 2021-06-29 DIAGNOSIS — E1165 Type 2 diabetes mellitus with hyperglycemia: Secondary | ICD-10-CM

## 2021-06-29 DIAGNOSIS — E1142 Type 2 diabetes mellitus with diabetic polyneuropathy: Secondary | ICD-10-CM

## 2021-06-29 LAB — MICROALBUMIN / CREATININE URINE RATIO
Creatinine,U: 59.3 mg/dL
Microalb Creat Ratio: 629.9 mg/g — ABNORMAL HIGH (ref 0.0–30.0)
Microalb, Ur: 373.3 mg/dL — ABNORMAL HIGH (ref 0.0–1.9)

## 2021-06-29 MED ORDER — FUROSEMIDE 40 MG PO TABS
40.0000 mg | ORAL_TABLET | Freq: Every day | ORAL | 3 refills | Status: DC
  Filled 2021-06-29: qty 30, 30d supply, fill #0

## 2021-06-29 MED ORDER — ROSUVASTATIN CALCIUM 40 MG PO TABS
ORAL_TABLET | Freq: Every evening | ORAL | 3 refills | Status: DC
  Filled 2021-06-29 (×2): qty 90, 90d supply, fill #0

## 2021-06-29 MED ORDER — NOVOLIN 70/30 FLEXPEN (70-30) 100 UNIT/ML ~~LOC~~ SUPN
PEN_INJECTOR | SUBCUTANEOUS | 3 refills | Status: DC
  Filled 2021-06-29: qty 54, 90d supply, fill #0

## 2021-06-29 MED ORDER — LOSARTAN POTASSIUM 100 MG PO TABS
100.0000 mg | ORAL_TABLET | Freq: Every day | ORAL | 1 refills | Status: DC
  Filled 2021-06-29: qty 90, 90d supply, fill #0

## 2021-06-29 NOTE — Patient Instructions (Addendum)
Check blood sugars on waking up daily  Also check blood sugars about 2 hours after meals and do this after different meals by rotation  Recommended blood sugar levels on waking up are 90-130 and about 2 hours after meal is 130-180  Please bring your blood sugar monitor to each visit, thank you  INSULIN 40 UNITS before breakfast and 30 units before  dinner  Call if sugar goes below 100  Small portions of rice or starchy foods  Ki?m tra l??ng ???ng trong mu khi th?c d?y hng ngy  Ngoi ra, hy ki?m tra l??ng ???ng trong mu kho?ng 2 gi? sau b?a ?n v th?c hi?n vi?c ny sau cc b?a ?n khc nhau b?ng cch lun phin  M?c ???ng huy?t ???c khuy?n ngh? khi th?c d?y l 90-130 v kho?ng 2 gi? sau khi ?n l 130-180  Vui lng mang theo my ?o ???ng huy?t khi ?i khm, xin c?m ?n  INSULIN 40 ??N V? tr??c b?a ?n sng v 30 ??n v? tr??c b?a t?i  G?i n?u ???ng xu?ng d??i 100  M?t ph?n nh? g?o ho?c th?c ph?m giu tinh b?t

## 2021-06-29 NOTE — Progress Notes (Signed)
Patient ID: Colin [name], male   DOB: Jul 31, 1950, 71 y.o.   MRN: [id number]           Reason for Appointment: Type II Diabetes follow-up   History of Present Illness   History was taken through the interpreter  Diagnosis date: 2006  Previous history:  He was started on insulin in 2021 and by history has been taking NPH, Lantus and NovoLog at various times However recent records indicate he has had persistently high A1c with the lowest reading only 8.4 in 9/22  Recent history:     Non-insulin hypoglycemic drugs: Farxiga 10 mg daily     Insulin regimen:   Humulin 70/30, 46 units at night    Side effects from medications: None  Current self management, blood sugar patterns and problems identified:  A1c is again significantly higher at 13.6, previously 15 Although he is supposed to take his insulin in the morning he is only taking at bedtime He thinks he is taking it fairly regularly However has not been taking the [place] that was prescribed Blood sugar in the lab midday was 246 He has never had any hypoglycemia with insulin including overnight Does not have any diet to follow and meals may be high in carbohydrates like rice   Diet management: He is usually avoiding regular soft drinks      Monitors blood glucose: < Once a day.    Glucometer: One Touch.           Blood Glucose readings not available    Hypoglycemia:  none                        Dietician visit: Most recent:   None Weight control:  Wt Readings from Last 3 Encounters:  06/29/21 134 lb (60.8 kg)  05/03/21 136 lb (61.7 kg)  04/12/21 130 lb 12.8 oz (59.3 kg)            Diabetes labs:  Lab Results  Component Value Date   HGBA1C 13.6 (H) 06/26/2021   HGBA1C 15.0 (A) 03/29/2021   HGBA1C 14.3 (A) 02/22/2021   Lab Results  Component Value Date   MICROALBUR 373.3 (H) 06/29/2021   LDLCALC 207 (H) 11/11/2020   CREATININE 1.42 06/26/2021     Allergies as of 06/29/2021   No Known Allergies       Medication List        Accurate as of Jun 29, 2021  4:59 PM. If you have any questions, ask your nurse or doctor.          STOP taking these medications    Entresto 24-26 MG Generic drug: sacubitril-valsartan Stopped by: Elayne Snare, MD       TAKE these medications    Accu-Chek Guide test strip Generic drug: glucose blood Use twice daily. And lancets 2/day   Accu-Chek Guide w/Device Kit USE AS DIRECTED   Accu-Chek Softclix Lancets lancets Use to check blood sugar three times daily.   bisoprolol 5 MG tablet Commonly known as: ZEBETA Take 1 tablet (5 mg total) by mouth daily.   bisoprolol 5 MG tablet Commonly known as: ZEBETA take 1 tablet by mouth daily   clopidogrel 75 MG tablet Commonly known as: Plavix Take 1 tablet (75 mg total) by mouth daily.   clopidogrel 75 MG tablet Commonly known as: PLAVIX take 1 tablet by mouth daily   Farxiga 10 MG Tabs tablet Generic drug: dapagliflozin propanediol Take 1 tablet (10 mg total)  by mouth daily.   FreeStyle Libre 2 Sensor Misc 1 Device by Does not apply route every 14 (fourteen) days.   furosemide 40 MG tablet Commonly known as: LASIX Take 1 tablet (40 mg total) by mouth daily. Started by: Elayne Snare, MD   gabapentin 100 MG capsule Commonly known as: NEURONTIN TAKE 1 CAPSULE (100 MG TOTAL) BY MOUTH 3 (THREE) TIMES DAILY.   ivabradine 5 MG Tabs tablet Commonly known as: CORLANOR Take 1 tablet (5 mg total) by mouth 2 (two) times daily with a meal.   losartan 100 MG tablet Commonly known as: COZAAR Take 1 tablet (100 mg total) by mouth daily. Started by: Elayne Snare, MD   midodrine 2.5 MG tablet Commonly known as: PROAMATINE Take 1 tablet (2.5 mg total) by mouth 3 (three) times daily with meals.   nitroGLYCERIN 0.4 MG SL tablet Commonly known as: NITROSTAT Place 1 tablet (0.4 mg total) under the tongue every 5 (five) minutes as needed for chest pain.   nitroGLYCERIN 0.4 MG SL tablet Commonly known  as: NITROSTAT Place 1 tab (0.57m total) under the tongue every 5 minutes as needed for chest pain   NovoLIN 70/30 Kwikpen (70-30) 100 UNIT/ML KwikPen Generic drug: insulin isophane & regular human KwikPen inject 30 units before meals twice daily What changed:  how much to take how to take this when to take this additional instructions Changed by: AElayne Snare MD   oxyCODONE 5 MG immediate release tablet Commonly known as: Oxy IR/ROXICODONE Take 1 tablet (5 mg total) by mouth every 6 (six) hours as needed for severe pain.   pantoprazole 20 MG tablet Commonly known as: PROTONIX Take 1 tablet (20 mg total) by mouth daily.   pantoprazole 20 MG tablet Commonly known as: PROTONIX Take 1 tab by mouth daily   rosuvastatin 40 MG tablet Commonly known as: CRESTOR Take 1 tablet (40 mg total) by mouth at bedtime. What changed: Another medication with the same name was changed. Make sure you understand how and when to take each. Changed by: AElayne Snare MD   rosuvastatin 40 MG tablet Commonly known as: CRESTOR Take 1 tablet by mouth once nightly at bedtime. What changed:  how to take this when to take this Changed by: AElayne Snare MD   spironolactone 25 MG tablet Commonly known as: ALDACTONE Take 0.5 tablets (12.5 mg total) by mouth daily.   TechLite Pen Needles 32G X 4 MM Misc Generic drug: Insulin Pen Needle Use as instructed to inject Lantus and Victoza once daily.        Allergies: No Known Allergies  Past Medical History:  Diagnosis Date   Arrhythmia    CAD (coronary artery disease)    stent x 2   Carotid artery occlusion    Diabetes mellitus    Dyslipidemia    Essential hypertension     Past Surgical History:  Procedure Laterality Date   CARDIAC CATHETERIZATION     01/2010 ; 04/2010   CORONARY ARTERY BYPASS GRAFT N/A 08/23/2020   Procedure: CORONARY ARTERY BYPASS GRAFTING (CABG) x 3 ON CARDIOPULMONARY BYPASS USING EVH AND PARTIALLY OPEN HARVESTED LEFT GREATER  SAPHENOUS VEIN AND LEFT INTERNAL MAMMARY ARTERY.  LIMA TO LAD, SVG TO OM, SVG TO PDA;  Surgeon: LLajuana Matte MD;  [place]  Service: Open Heart Surgery;  Laterality: N/A;   RIGHT/LEFT HEART CATH AND CORONARY ANGIOGRAPHY N/A 08/22/2020   Procedure: RIGHT/LEFT HEART CATH AND CORONARY ANGIOGRAPHY;  Surgeon: PNigel Mormon MD;  Location:  [place] [facility];  Service: Cardiovascular;  Laterality: N/A;   TEE WITHOUT CARDIOVERSION N/A 08/23/2020   Procedure: TRANSESOPHAGEAL ECHOCARDIOGRAM (TEE);  Surgeon: Lajuana Matte, MD;  Location: [place];  Service: Open Heart Surgery;  Laterality: N/A;   TEMPORARY PACEMAKER N/A 08/22/2020   Procedure: TEMPORARY PACEMAKER;  Surgeon: Nigel Mormon, MD;  Location: [place] [facility];  Service: Cardiovascular;  Laterality: N/A;   VEIN HARVEST Left 08/23/2020   Procedure: ENDOVEIN HARVEST AND PARTIALLY OPEN GREATER SAPHENOUS VEIN HARVESTING;  Surgeon: Lajuana Matte, MD;  Location: [place];  Service: Open Heart Surgery;  Laterality: Left;    Family History  Problem Relation Age of Onset   Diabetes Neg Hx     Social History:  reports that he quit smoking about 10 months ago. His smoking use included cigarettes. He has a 12.50 pack-year smoking history. He has never used smokeless tobacco. He reports that he does not drink alcohol and does not use drugs.  Review of Systems:  Last diabetic eye exam date unknown  Last foot exam date: 9/22  Occasionally may have some numbness in his feet    Hypertension: He has been managed with Entresto and metoprolol but has not taken these medications for some time  BP Readings from Last 3 Encounters:  06/29/21 (!) 170/80  06/19/21 (!) 184/90  05/03/21 130/78    Lipids: He was given Crestor by his cardiologist but he has not taken this    Lab Results  Component Value Date   CHOL 279 (H) 11/11/2020   CHOL 187 08/21/2020   CHOL 171 12/17/2019   Lab Results  Component Value Date    HDL 61 11/11/2020   HDL 40 (L) 08/21/2020   HDL 41 12/17/2019   Lab Results  Component Value Date   LDLCALC 207 (H) 11/11/2020   LDLCALC 136 (H) 08/21/2020   LDLCALC 114 (H) 12/17/2019   Lab Results  Component Value Date   TRIG 71 11/11/2020   TRIG 53 08/21/2020   TRIG 88 12/17/2019   Lab Results  Component Value Date   CHOLHDL 4.6 11/11/2020   CHOLHDL 4.7 08/21/2020   CHOLHDL 4.2 12/17/2019   Lab Results  Component Value Date   LDLDIRECT 137.1 (H) 08/21/2020    LEG edema: He is currently having lower leg edema for the last 2 weeks, apparently has not had this before   Examination:   BP (!) 170/80   Pulse 75   Ht 5' 4"  (1.626 m)   Wt 134 lb (60.8 kg)   SpO2 97%   BMI 23.00 kg/m   Body mass index is 23 kg/m.   Feet are normal to inspection including plantar surfaces 2+ lower leg pedal edema present  ASSESSMENT/ PLAN:    Diabetes type 2:   Current regimen: 70/30 insulin 40 units at bedtime  Blood glucose control is persistently poor This is likely to be from inadequate insulin which is also being taken at bedtime instead of before meals Also have minimal glucose monitoring  Recommendations:  Check blood sugars on waking up daily  Also check blood sugars about 2 hours after meals and do this after different meals by rotation  Recommended blood sugar levels on waking up are 90-130 and about 2 hours after meal is 130-180  Please bring your blood sugar monitor to each visit, thank you  Premixed INSULIN 40 UNITS before breakfast and 30 units before  dinner If he has abnormal blood sugar patterns on his follow-up  visit with need to change his insulin type will do so  Unlikely he needs Farxiga at this time but may only consider it if he has significant persistent microalbuminuria Since he likely has type 1 diabetes he may be at risk for DKA with this  Call if sugar goes below 100  Small portions of rice or starchy foods  HYPERTENSION: Currently not  treated and will start him on losartan 100 mg daily Will need short-term follow-up Also check urine microalbumin For his EDEMA he will take Lasix 40 mg daily  Patient Instructions  Check blood sugars on waking up daily  Also check blood sugars about 2 hours after meals and do this after different meals by rotation  Recommended blood sugar levels on waking up are 90-130 and about 2 hours after meal is 130-180  Please bring your blood sugar monitor to each visit, thank you  INSULIN 40 UNITS before breakfast and 30 units before  dinner  Call if sugar goes below 100  Small portions of rice or starchy foods  Ki?m tra l??ng ???ng trong mu khi th?c d?y hng ngy  Ngoi ra, hy ki?m tra l??ng ???ng trong mu kho?ng 2 gi? sau b?a ?n v th?c hi?n vi?c ny sau cc b?a ?n khc nhau b?ng cch lun phin  M?c ???ng huy?t ???c khuy?n ngh? khi th?c d?y l 90-130 v kho?ng 2 gi? sau khi ?n l 130-180  Vui lng mang theo my ?o ???ng huy?t khi ?i khm, xin c?m ?n  INSULIN 40 ??N V? tr??c b?a ?n sng v 30 ??n v? tr??c b?a t?i  G?i n?u ???ng xu?ng d??i 100  M?t ph?n nh? g?o ho?c th?c ph?m giu tinh b?t   Elayne Snare 06/29/2021, 4:59 PM   Renal: Urine microalbumin 629, previously 903, will continue to follow

## 2021-06-30 ENCOUNTER — Other Ambulatory Visit: Payer: Self-pay

## 2021-06-30 ENCOUNTER — Encounter: Payer: Self-pay | Admitting: Endocrinology

## 2021-08-10 ENCOUNTER — Encounter: Payer: Self-pay | Admitting: Endocrinology

## 2021-08-10 ENCOUNTER — Other Ambulatory Visit: Payer: Self-pay

## 2021-08-10 ENCOUNTER — Ambulatory Visit (INDEPENDENT_AMBULATORY_CARE_PROVIDER_SITE_OTHER): Payer: Medicare HMO | Admitting: Endocrinology

## 2021-08-10 VITALS — BP 142/68 | HR 72 | Ht 64.0 in | Wt 135.0 lb

## 2021-08-10 DIAGNOSIS — E1121 Type 2 diabetes mellitus with diabetic nephropathy: Secondary | ICD-10-CM | POA: Diagnosis not present

## 2021-08-10 DIAGNOSIS — Z794 Long term (current) use of insulin: Secondary | ICD-10-CM

## 2021-08-10 DIAGNOSIS — E1165 Type 2 diabetes mellitus with hyperglycemia: Secondary | ICD-10-CM

## 2021-08-10 DIAGNOSIS — I10 Essential (primary) hypertension: Secondary | ICD-10-CM | POA: Diagnosis not present

## 2021-08-10 DIAGNOSIS — I2510 Atherosclerotic heart disease of native coronary artery without angina pectoris: Secondary | ICD-10-CM | POA: Diagnosis not present

## 2021-08-10 MED ORDER — EMPAGLIFLOZIN 10 MG PO TABS
10.0000 mg | ORAL_TABLET | Freq: Every day | ORAL | 3 refills | Status: DC
  Filled 2021-08-10: qty 90, 90d supply, fill #0

## 2021-08-10 MED ORDER — ROSUVASTATIN CALCIUM 40 MG PO TABS
ORAL_TABLET | Freq: Every evening | ORAL | 3 refills | Status: DC
  Filled 2021-08-10: qty 90, fill #0

## 2021-08-10 NOTE — Patient Instructions (Addendum)
Must take insulin 2x daily before meals  Ok to reduce but  to skip  Check blood sugars on waking up 2-3 days a week  Also check blood sugars about 2 hours after meals and do this after different meals by rotation  Recommended blood sugar levels on waking up are 90-130 and about 2 hours after meal is 130-160  Please bring your blood sugar monitor to each visit, thank you  Please follow-up with your heart doctor

## 2021-08-10 NOTE — Progress Notes (Signed)
Patient ID: Colin [name], male   DOB: February 20, 1950, 70 y.o.   MRN: [id number]           Reason for Appointment: Type II Diabetes follow-up   History of Present Illness   History was taken through the interpreter  Diagnosis date: 2006  Previous history:  He was started on insulin in 2021 and by history has been taking NPH, Lantus and NovoLog at various times However recent records indicate he has had persistently high A1c with the lowest reading only 8.4 in 9/22  Recent history:     Non-insulin hypoglycemic drugs: Previously on Farxiga 10 mg daily     Insulin regimen:   Humulin 70/30, 30 units bid    Side effects from medications: None  Current self management, blood sugar patterns and problems identified:  A1c is last significantly higher at 13.6, previously 15 Although he is reporting that he is taking his insulin twice a day as directed he likely did not take it yesterday evening or this morning  He says he tends to forget to take his insulin sometimes Also he does not check his blood sugars as he thinks he can tell by the way he feels or how much his legs are swelling  No reported hypoglycemia, his previous symptoms have been mostly feeling excessively tired Again has not been taking the [place] that was prescribed His blood sugar in the office today was 480 after lunch He does not think he feels weak or thirsty   Diet management: He is usually avoiding regular soft drinks      Monitors blood glucose: < Once a day.    Glucometer: One Touch.           Blood Glucose readings not available   Hypoglycemia:  none                        Dietician visit: Most recent:   None Weight control:  Wt Readings from Last 3 Encounters:  08/10/21 135 lb (61.2 kg)  06/29/21 134 lb (60.8 kg)  05/03/21 136 lb (61.7 kg)            Diabetes labs:  Lab Results  Component Value Date   HGBA1C 13.6 (H) 06/26/2021   HGBA1C 15.0 (A) 03/29/2021   HGBA1C 14.3 (A) 02/22/2021   Lab  Results  Component Value Date   MICROALBUR 373.3 (H) 06/29/2021   LDLCALC 207 (H) 11/11/2020   CREATININE 1.42 06/26/2021     Allergies as of 08/10/2021   No Known Allergies      Medication List        Accurate as of August 10, 2021 11:59 PM. If you have any questions, ask your nurse or doctor.          STOP taking these medications    Farxiga 10 MG Tabs tablet Generic drug: dapagliflozin propanediol Stopped by: Elayne Snare, MD       TAKE these medications    Accu-Chek Guide test strip Generic drug: glucose blood Use twice daily. And lancets 2/day   Accu-Chek Guide w/Device Kit USE AS DIRECTED   Accu-Chek Softclix Lancets lancets Use to check blood sugar three times daily.   bisoprolol 5 MG tablet Commonly known as: ZEBETA Take 1 tablet (5 mg total) by mouth daily.   bisoprolol 5 MG tablet Commonly known as: ZEBETA take 1 tablet by mouth daily   clopidogrel 75 MG tablet Commonly known as: Plavix Take 1 tablet (75  mg total) by mouth daily.   clopidogrel 75 MG tablet Commonly known as: PLAVIX take 1 tablet by mouth daily   FreeStyle Libre 2 Sensor Misc 1 Device by Does not apply route every 14 (fourteen) days.   furosemide 40 MG tablet Commonly known as: LASIX Take 1 tablet (40 mg total) by mouth daily.   gabapentin 100 MG capsule Commonly known as: NEURONTIN TAKE 1 CAPSULE (100 MG TOTAL) BY MOUTH 3 (THREE) TIMES DAILY.   ivabradine 5 MG Tabs tablet Commonly known as: CORLANOR Take 1 tablet (5 mg total) by mouth 2 (two) times daily with a meal.   Jardiance 10 MG Tabs tablet Generic drug: empagliflozin Take 1 tablet (10 mg total) by mouth daily with breakfast. Started by: Elayne Snare, MD   losartan 100 MG tablet Commonly known as: COZAAR Take 1 tablet (100 mg total) by mouth daily.   midodrine 2.5 MG tablet Commonly known as: PROAMATINE Take 1 tablet (2.5 mg total) by mouth 3 (three) times daily with meals.   nitroGLYCERIN 0.4 MG SL  tablet Commonly known as: NITROSTAT Place 1 tablet (0.4 mg total) under the tongue every 5 (five) minutes as needed for chest pain.   nitroGLYCERIN 0.4 MG SL tablet Commonly known as: NITROSTAT Place 1 tab (0.73m total) under the tongue every 5 minutes as needed for chest pain   NovoLIN 70/30 Kwikpen (70-30) 100 UNIT/ML KwikPen Generic drug: insulin isophane & regular human KwikPen inject 30 units before meals twice daily   oxyCODONE 5 MG immediate release tablet Commonly known as: Oxy IR/ROXICODONE Take 1 tablet (5 mg total) by mouth every 6 (six) hours as needed for severe pain.   pantoprazole 20 MG tablet Commonly known as: PROTONIX Take 1 tablet (20 mg total) by mouth daily.   pantoprazole 20 MG tablet Commonly known as: PROTONIX Take 1 tab by mouth daily   rosuvastatin 40 MG tablet Commonly known as: CRESTOR Take 1 tablet by mouth once nightly at bedtime. What changed: Another medication with the same name was removed. Continue taking this medication, and follow the directions you see here. Changed by: AElayne Snare MD   spironolactone 25 MG tablet Commonly known as: ALDACTONE Take 0.5 tablets (12.5 mg total) by mouth daily.   TechLite Pen Needles 32G X 4 MM Misc Generic drug: Insulin Pen Needle Use as instructed to inject Lantus and Victoza once daily.        Allergies: No Known Allergies  Past Medical History:  Diagnosis Date   Arrhythmia    CAD (coronary artery disease)    stent x 2   Carotid artery occlusion    Diabetes mellitus    Dyslipidemia    Essential hypertension     Past Surgical History:  Procedure Laterality Date   CARDIAC CATHETERIZATION     01/2010 ; 04/2010   CORONARY ARTERY BYPASS GRAFT N/A 08/23/2020   Procedure: CORONARY ARTERY BYPASS GRAFTING (CABG) x 3 ON CARDIOPULMONARY BYPASS USING EVH AND PARTIALLY OPEN HARVESTED LEFT GREATER SAPHENOUS VEIN AND LEFT INTERNAL MAMMARY ARTERY.  LIMA TO LAD, SVG TO OM, SVG TO PDA;  Surgeon: LLajuana Matte MD;  [place]  Service: Open Heart Surgery;  Laterality: N/A;   RIGHT/LEFT HEART CATH AND CORONARY ANGIOGRAPHY N/A 08/22/2020   Procedure: RIGHT/LEFT HEART CATH AND CORONARY ANGIOGRAPHY;  Surgeon: PNigel Mormon MD;  [facility];  Service: Cardiovascular;  Laterality: N/A;   TEE WITHOUT CARDIOVERSION N/A 08/23/2020   Procedure: TRANSESOPHAGEAL ECHOCARDIOGRAM (TEE);  Surgeon: Lajuana Matte, MD;  Location: [place];  Service: Open Heart Surgery;  Laterality: N/A;   TEMPORARY PACEMAKER N/A 08/22/2020   Procedure: TEMPORARY PACEMAKER;  Surgeon: Nigel Mormon, MD;  Location: [place] [facility];  Service: Cardiovascular;  Laterality: N/A;   VEIN HARVEST Left 08/23/2020   Procedure: ENDOVEIN HARVEST AND PARTIALLY OPEN GREATER SAPHENOUS VEIN HARVESTING;  Surgeon: Lajuana Matte, MD;  Location: [place];  Service: Open Heart Surgery;  Laterality: Left;    Family History  Problem Relation Age of Onset   Diabetes Neg Hx     Social History:  reports that he quit smoking about a year ago. His smoking use included cigarettes. He has a 12.50 pack-year smoking history. He has never used smokeless tobacco. He reports that he does not drink alcohol and does not use drugs.  Review of Systems:  Last diabetic eye exam date unknown  Last foot exam date: 9/22  Occasionally may have some numbness in his feet    Hypertension: He has been managed with Entresto and metoprolol previously his cardiologist  His today's blood pressure is better at 142/68 with starting losartan in 5/23  BP Readings from Last 3 Encounters:  08/10/21 (!) 142/68  06/29/21 (!) 170/80  06/19/21 (!) 184/90    Lipids: He was given Crestor by his cardiologist but he has not taken this    Lab Results  Component Value Date   CHOL 279 (H) 11/11/2020   CHOL 187 08/21/2020   CHOL 171 12/17/2019   Lab Results  Component Value Date   HDL 61 11/11/2020   HDL 40 (L) 08/21/2020    HDL 41 12/17/2019   Lab Results  Component Value Date   LDLCALC 207 (H) 11/11/2020   LDLCALC 136 (H) 08/21/2020   LDLCALC 114 (H) 12/17/2019   Lab Results  Component Value Date   TRIG 71 11/11/2020   TRIG 53 08/21/2020   TRIG 88 12/17/2019   Lab Results  Component Value Date   CHOLHDL 4.6 11/11/2020   CHOLHDL 4.7 08/21/2020   CHOLHDL 4.2 12/17/2019   Lab Results  Component Value Date   LDLDIRECT 137.1 (H) 08/21/2020    LEG edema: He is taking Lasix with improvement   Examination:   BP (!) 142/68   Pulse 72   Ht 5' 4"  (1.626 m)   Wt 135 lb (61.2 kg)   SpO2 97%   BMI 23.17 kg/m   Body mass index is 23.17 kg/m.   No significant ankle edema  ASSESSMENT/ PLAN:    Diabetes type 2:   Current regimen: 70/30 insulin twice a day  Blood glucose control is persistently poor  Glucose is over 400 in the office from not taking his insulin Still not doing glucose monitoring  Recommendations:  Premixed INSULIN 30 UNITS before breakfast and 30 units before  dinner Will need follow-up with diabetes educator to help assess his insulin doses but he needs to start checking his blood sugars regularly to enable insulin adjustment Discussed needing to prevent further diabetes complications with better diabetes control and regular monitoring Unclear whether he will be capable of using a freestyle libre or Dexcom sensor  For multiple benefits including renal, diabetes, blood pressure and cardiac we will start on a SGLT2 drug Apparently his Vania Rea is better covered than [place] and will start with 10 mg daily  HYPERTENSION: Improved on losartan 100 mg daily Also edema better with Lasix which he can continue, this is likely be related to  diabetic nephropathy Also needs to follow-up with his cardiologist  History of hyperlipidemia: will start back on Crestor 40 mg daily especially with his history of CAD and most recent LDL of 207  Patient Instructions  Must take insulin 2x  daily before meals  Ok to reduce but  to skip  Check blood sugars on waking up 2-3 days a week  Also check blood sugars about 2 hours after meals and do this after different meals by rotation  Recommended blood sugar levels on waking up are 90-130 and about 2 hours after meal is 130-160  Please bring your blood sugar monitor to each visit, thank you  Please follow-up with your heart doctor   Elayne Snare 08/11/2021, 2:10 PM   Renal: Urine microalbumin 629, previously 903, will continue to follow

## 2021-08-11 LAB — POCT GLUCOSE (DEVICE FOR HOME USE): Glucose Fasting, POC: 484 mg/dL — AB (ref 70–99)

## 2021-08-18 IMAGING — CT CT ANGIO CHEST-ABD-PELV FOR DISSECTION W/ AND WO/W CM
2 of 7 series · 11 of 46 positions shown, 12 images · non-contrast
Comparison: No prior chest CT. CT the abdomen and pelvis
12/08/2010.

CLINICAL DATA: 70-year-old male with history of chest pain and back
pain with shortness of breath since yesterday.

EXAM:
CT ANGIOGRAPHY CHEST, ABDOMEN AND PELVIS
TECHNIQUE: Non-contrast CT of the chest was initially obtained.

[Series 6: arterial · axial · arterial · 0.64mm/px · z∈[+752,+1236]mm · 8 of 308 slices shown, 9 images]
[im 33/308  soft-tissue]
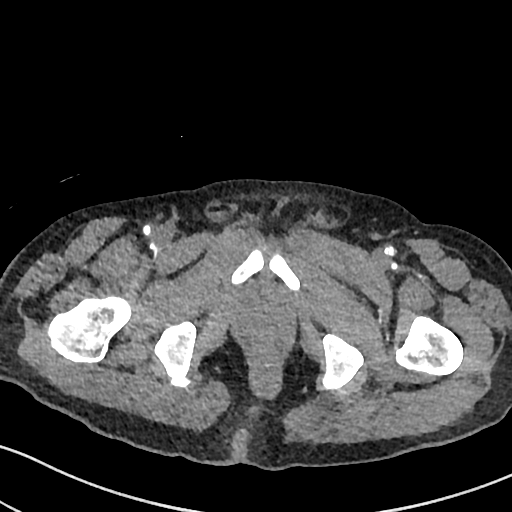
[im 33/308  bone]
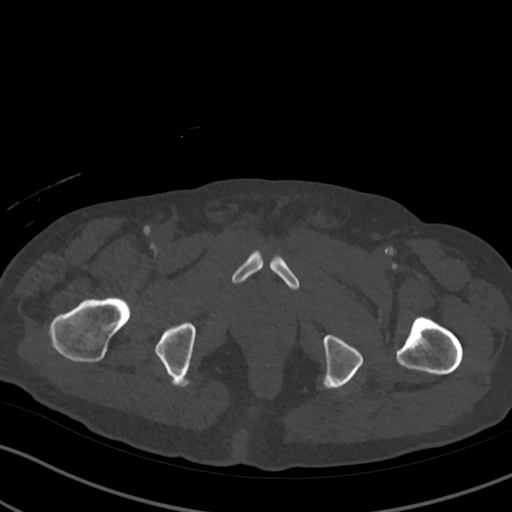
[im 65/308  soft-tissue]
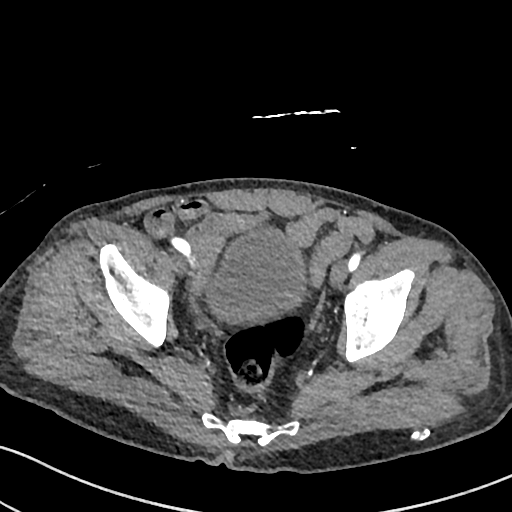
[im 97/308  soft-tissue]
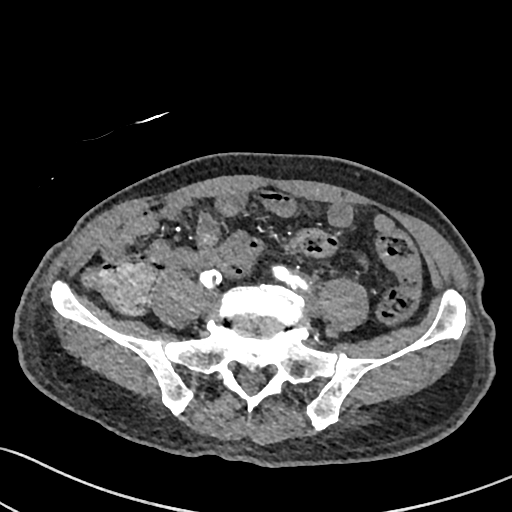
[im 130/308  soft-tissue]
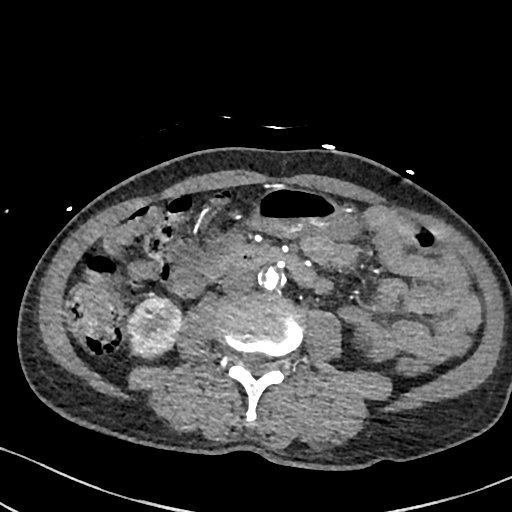
[im 178/308  soft-tissue]
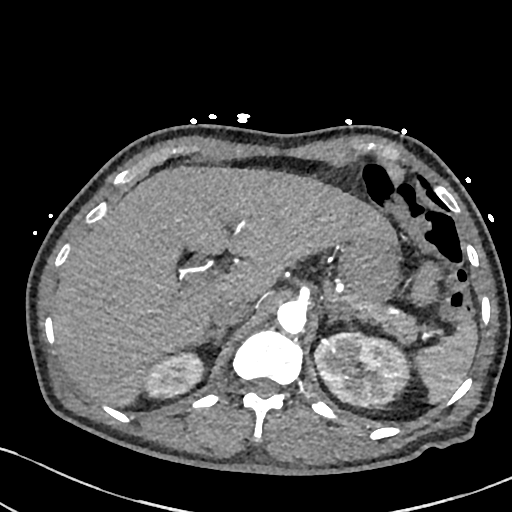
[im 211/308  soft-tissue]
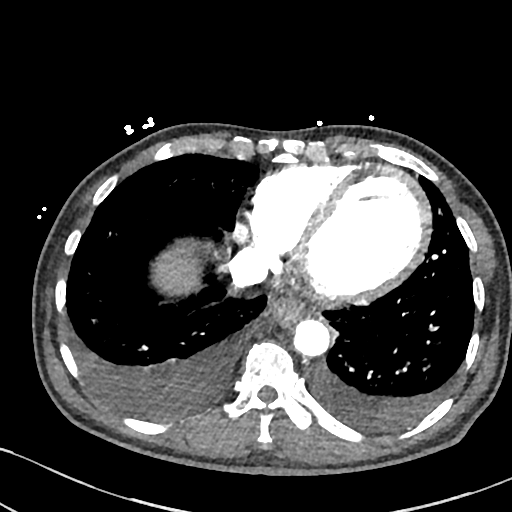
[im 243/308  soft-tissue]
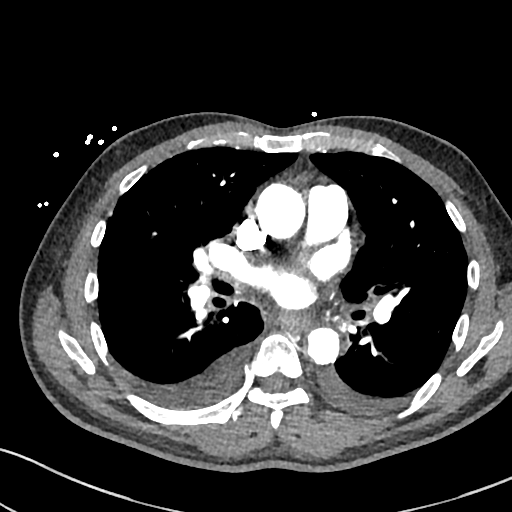
[im 275/308  soft-tissue]
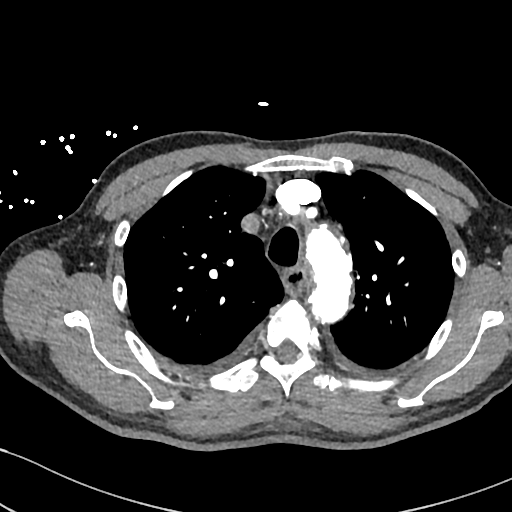

[Series 9: cor · coronal · 0.74mm/px · 3 of 147 slices shown]
[im 37/147  soft-tissue]
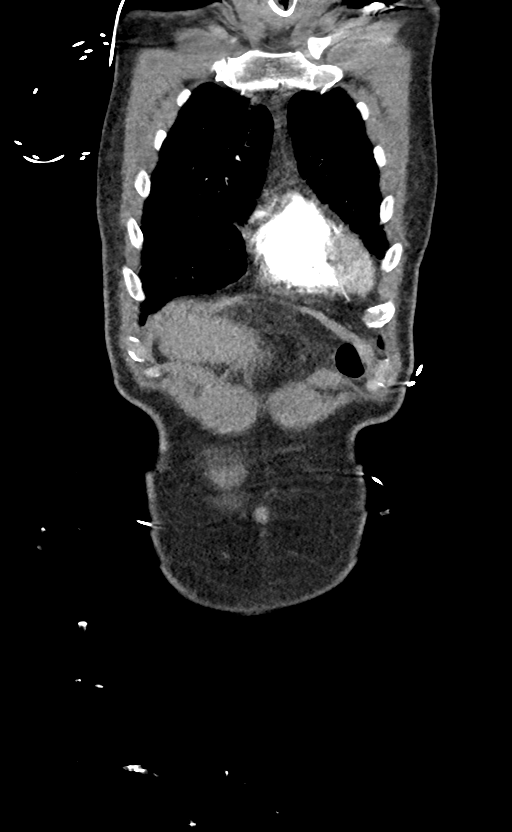
[im 74/147  soft-tissue]
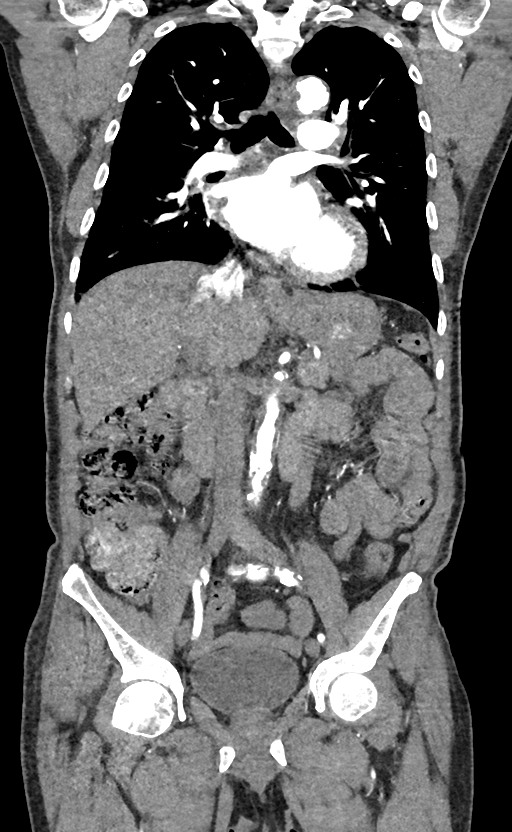
[im 110/147  soft-tissue]
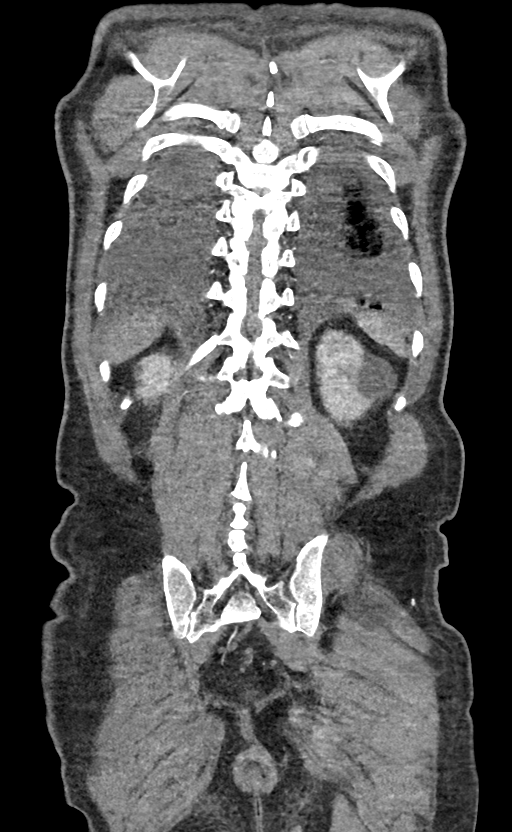

[11 of 46 positions shown; findings below may reference images not displayed]

Multidetector CT imaging through the chest, abdomen and pelvis was
performed using the standard protocol during bolus administration of
intravenous contrast. Multiplanar reconstructed images and MIPs were
obtained and reviewed to evaluate the vascular anatomy.

CONTRAST:  100mL OMNIPAQUE IOHEXOL 350 MG/ML SOLN
FINDINGS: CTA CHEST FINDINGS

Cardiovascular: Precontrast images demonstrate no crescentic high
attenuation associated with the wall of the thoracic aorta to
suggest acute intramural hemorrhage. There is extensive aortic
atherosclerosis, without definite evidence of aneurysm or dissection
of the thoracic aorta on today's non cardiac gated examination.
Ascending aorta, mid aortic arch and descending aorta measure
cm, 2.5 cm and 2.6 cm in diameter respectively. Atherosclerotic
calcifications are also noted in the left main, left anterior
descending, left circumflex and right coronary arteries.
Calcifications of the aortic valve. Aberrant right subclavian with
moderate to severe stenosis just beyond the ostium. Heart size is
enlarged with left ventricular dilatation. There is no significant
pericardial fluid, thickening or pericardial calcification.

Mediastinum/Nodes: No pathologically enlarged mediastinal or hilar
lymph nodes. Esophagus is unremarkable in appearance. No axillary
lymphadenopathy.

Lungs/Pleura: Moderate right and small left pleural effusions lying
dependently. Patchy areas of mild ground-glass attenuation and
diffuse interlobular septal thickening in the lungs, most compatible
with a background of interstitial pulmonary edema. No confluent
consolidative airspace disease. No pleural effusions. No definite
suspicious appearing pulmonary nodules or masses are noted.

Musculoskeletal: There are no aggressive appearing lytic or blastic
lesions noted in the visualized portions of the skeleton.

Review of the MIP images confirms the above findings.

CTA ABDOMEN AND PELVIS FINDINGS

VASCULAR

Aorta: Extensive atherosclerosis of the abdominal aorta with
abundant atheromatous plaque, including several ulcerative plaques.
No frank aortic aneurysm or dissection noted. No definite
penetrating ulcer. Mild narrowing of the infrarenal abdominal aorta
(minimal measurements of 10 x 6 mm shortly above the bifurcation).

Celiac: Patent without evidence of aneurysm, dissection, vasculitis
or significant stenosis.

SMA: Patent without evidence of aneurysm, dissection, vasculitis or
significant stenosis.

Renals: Both renal arteries are patent without evidence of aneurysm,
dissection, vasculitis, fibromuscular dysplasia or significant
stenosis.

IMA: Patent without evidence of aneurysm, dissection or vasculitis.
Moderate stenosis at the ostium.

Inflow: Extensive atherosclerosis. Patent without evidence of
aneurysm, dissection, vasculitis or significant stenosis.

Veins: No obvious venous abnormality within the limitations of this
arterial phase study.

Review of the MIP images confirms the above findings.

NON-VASCULAR

Hepatobiliary: No suspicious cystic or solid hepatic lesions. No
intra or extrahepatic biliary ductal dilatation. Gallbladder is
normal in appearance.

Pancreas: No pancreatic mass. No pancreatic ductal dilatation. No
pancreatic or peripancreatic fluid collections or inflammatory
changes.

Spleen: Unremarkable.

Adrenals/Urinary Tract: Low-attenuation lesions in the kidneys
bilaterally, compatible with simple cysts, largest of which is in
the interpolar region of the left kidney measuring 4.4 cm in
diameter. No aggressive appearing renal lesions. Bilateral adrenal
glands are normal in appearance. No hydroureteronephrosis. Urinary
bladder is normal in appearance.

Stomach/Bowel: Normal appearance of the stomach. No pathologic
dilatation of small bowel or colon. The appendix is not confidently
identified and may be surgically absent. Regardless, there are no
inflammatory changes noted adjacent to the cecum to suggest the
presence of an acute appendicitis at this time.

Lymphatic: No lymphadenopathy noted in the abdomen or pelvis.

Reproductive: Prostate gland and seminal vesicles are unremarkable
in appearance.

Other: No significant volume of ascites.  No pneumoperitoneum.

Musculoskeletal: There are no aggressive appearing lytic or blastic
lesions noted in the visualized portions of the skeleton.

Review of the MIP images confirms the above findings.
IMPRESSION: 1. Severe atherosclerosis of the thoracoabdominal aorta, including
multiple ulcerated plaques in the infrarenal abdominal aorta.
However, there is no evidence of thoracic aortic aneurysm or
dissection.
2. Moderate to severe stenosis just beyond the ostium of the patient
is aberrant right subclavian artery.
3. Mild stenosis of the infrarenal abdominal aorta and moderate
stenosis at the ostium of the inferior mesenteric artery.
4. Cardiomegaly with left ventricular dilatation. There is also
evidence of interstitial pulmonary edema, along with moderate right
and small left pleural effusions; imaging findings concerning for
congestive heart failure.
5. Left main and 3 vessel coronary artery disease. Please note that
although the presence of coronary artery calcium documents the
presence of coronary artery disease, the severity of this disease
and any potential stenosis cannot be assessed on this non-gated CT
examination. Assessment for potential risk factor modification,
dietary therapy or pharmacologic therapy may be warranted, if
clinically indicated.
6. There are calcifications of the aortic valve. Echocardiographic
correlation for evaluation of potential valvular dysfunction may be
warranted if clinically indicated.
7. Additional incidental findings, as above.

## 2021-08-18 IMAGING — DX DG CHEST 2V
2 series · 2 of 2 positions shown · non-contrast
Comparison: Chest radiographs 05/06/2020 and earlier.

CLINICAL DATA: 70-year-old male with chest pain on the left.
Shortness of breath since yesterday. Denies cough or fever.

EXAM:
CHEST - 2 VIEW

[chest pa]
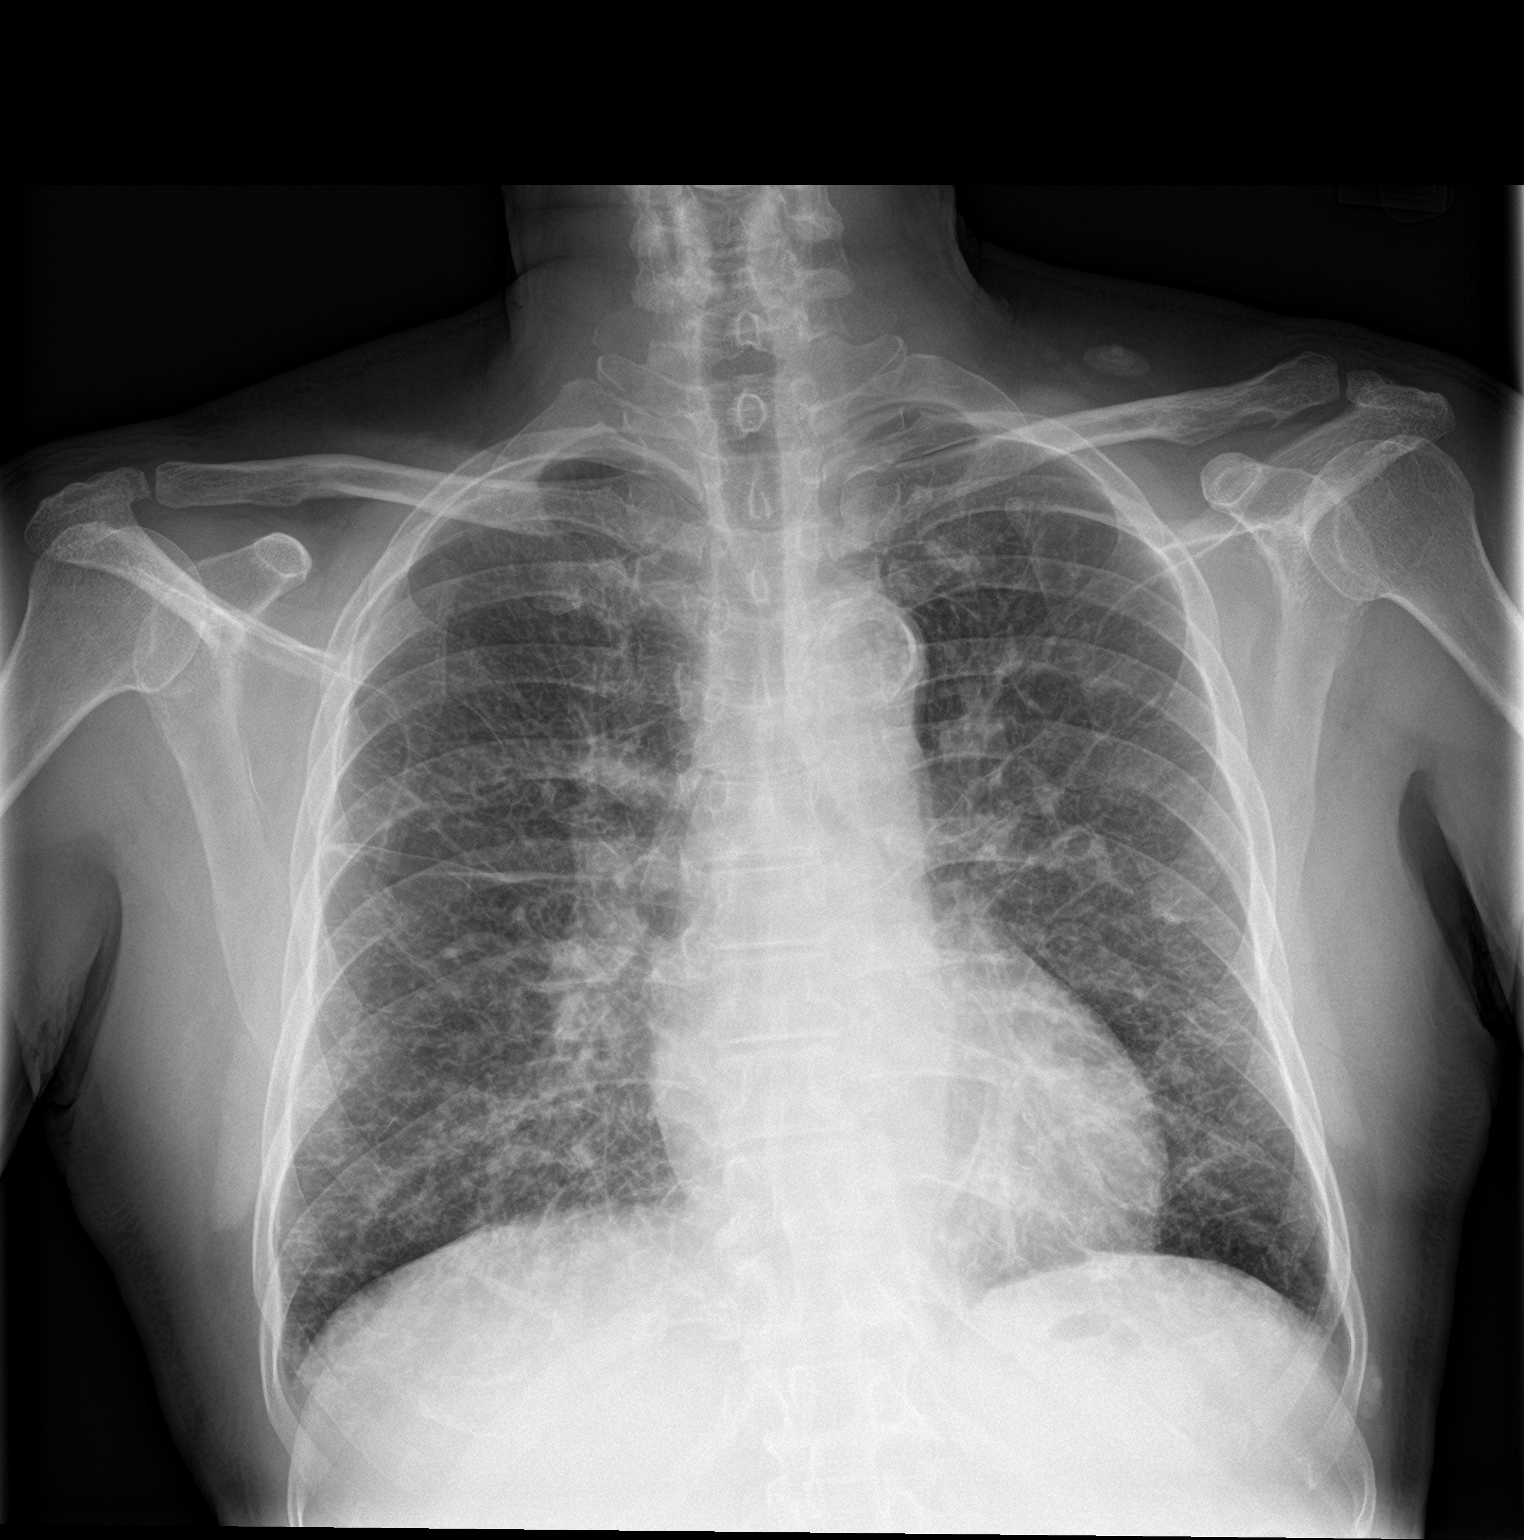

[chest lat]
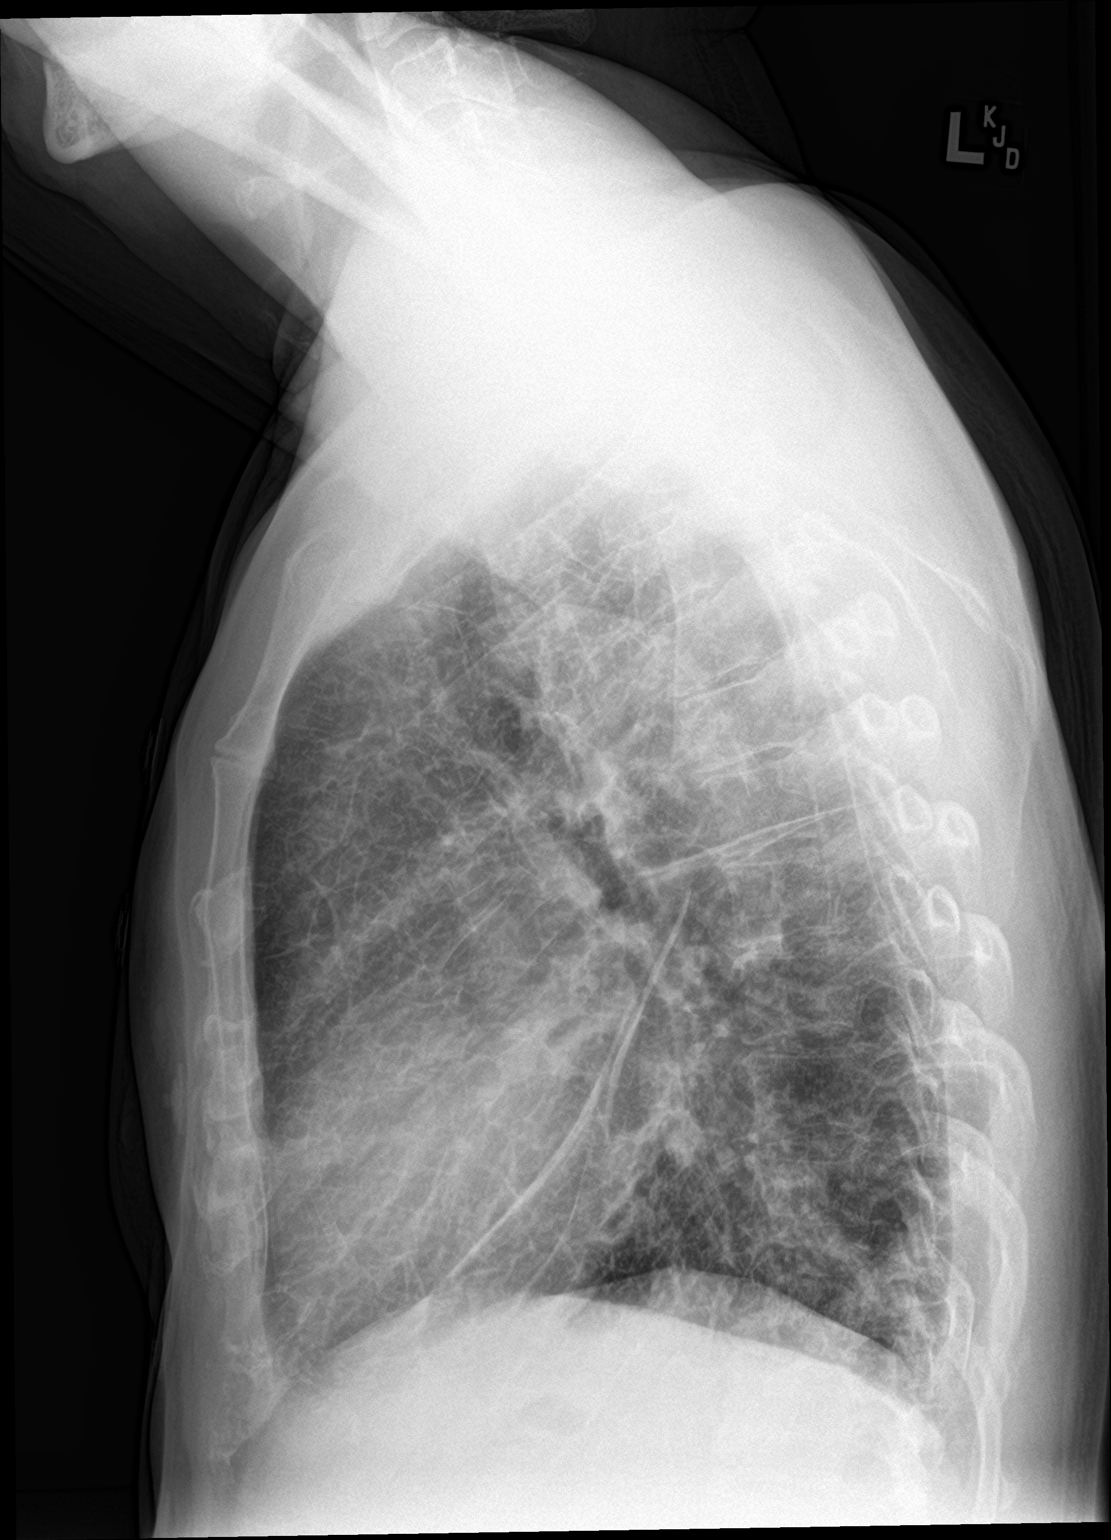

[2 of 2 positions shown; findings below may reference images not displayed]

FINDINGS: Lung volumes and mediastinal contours are within normal limits. On
the lateral view there is trace pleural fluid in the fissures, and
there is acute diffuse increased interstitial opacity in both lungs.
Kerley B-lines identified in both lung periphery.

Normal cardiac size and mediastinal contours. Visualized tracheal
air column is within normal limits. No pneumothorax. No acute
osseous abnormality identified. Paucity of bowel gas in the upper
abdomen.
IMPRESSION: Acute pulmonary interstitial edema with trace pleural fluid.

## 2021-08-21 IMAGING — DX DG CHEST 1V PORT
1 series · 1 of 1 positions shown · non-contrast
Comparison: 08/20/2020

CLINICAL DATA: Check trans venous pacemaker

EXAM:
PORTABLE CHEST 1 VIEW

[chest]
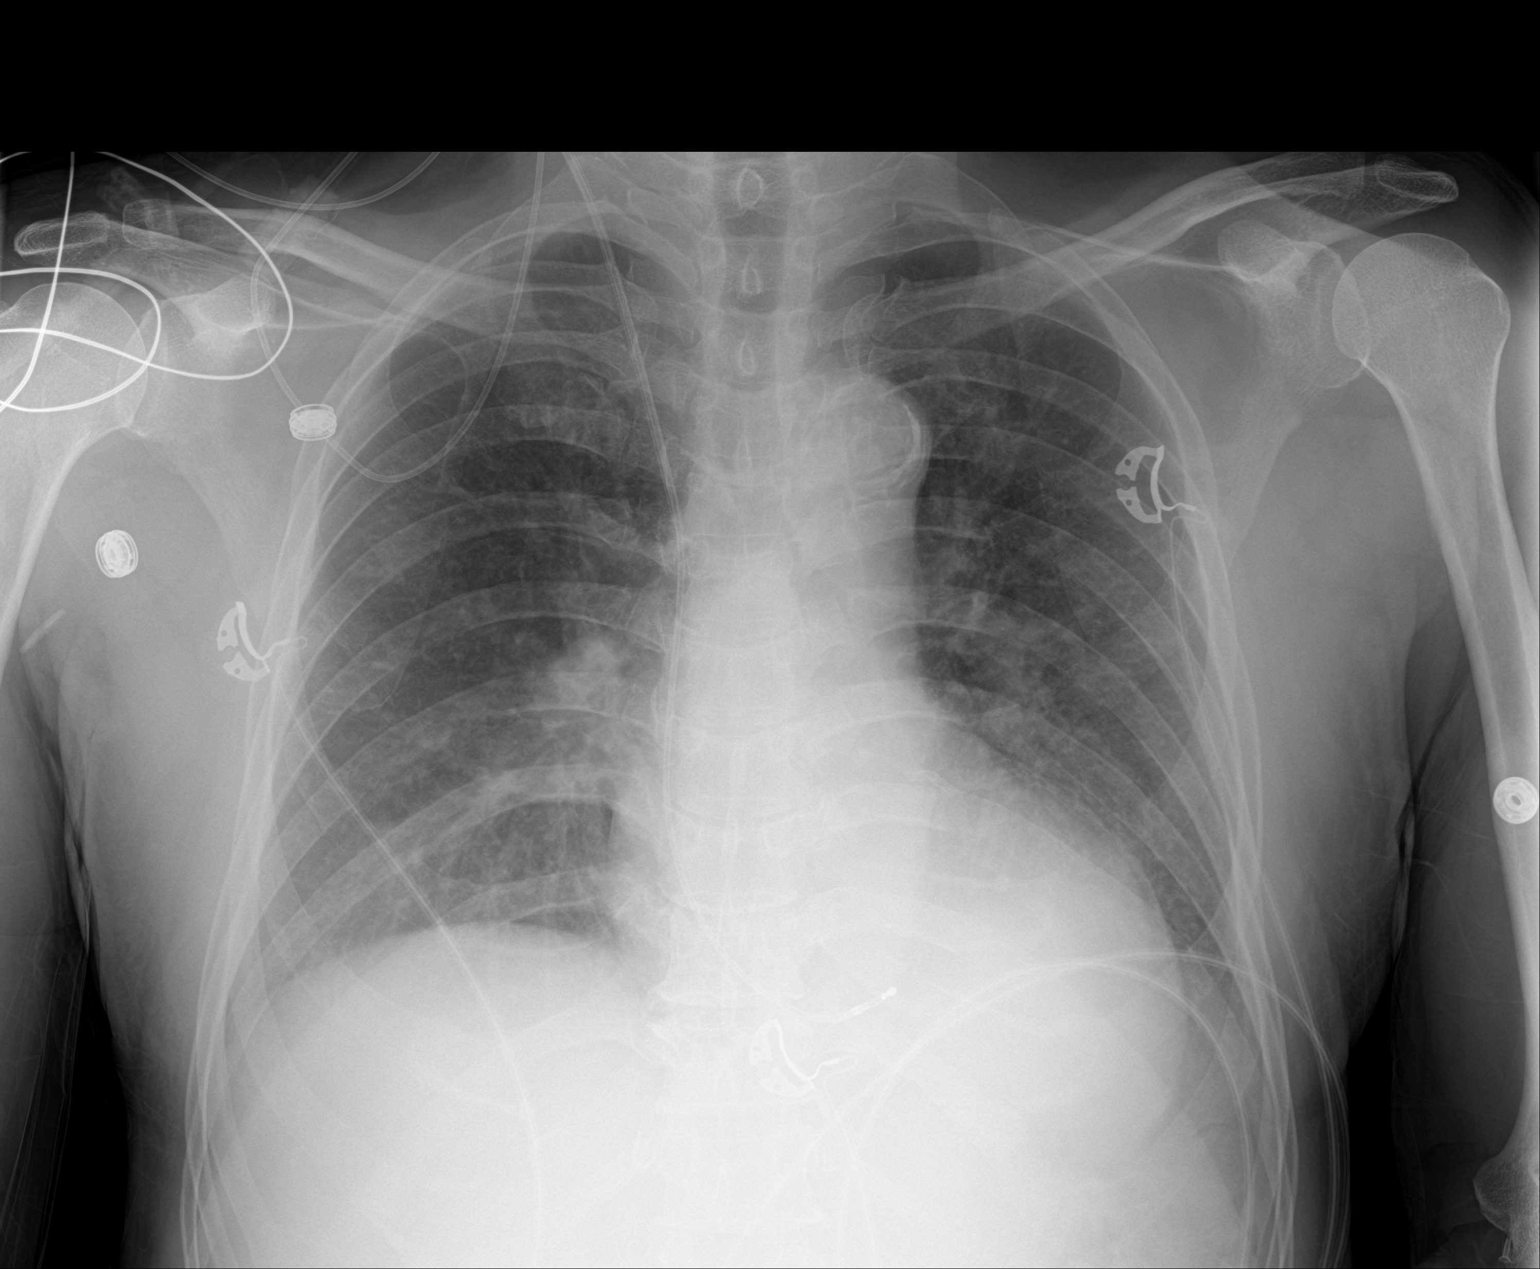

[1 of 1 positions shown; findings below may reference images not displayed]

FINDINGS: Cardiac shadow is stable. Aortic calcifications are noted. Right
jugular transvenous pacemaker is noted overlying cardiac shadow.
Vascular congestion is noted without significant edema. No focal
infiltrate is noted. No bony abnormality is seen.
IMPRESSION: Transvenous pacer in appropriate position.

Changes of vascular congestion.

## 2021-08-21 IMAGING — DX DG CHEST 1V PORT
1 series · 2 of 2 positions shown · non-contrast
Comparison: August 23, 2020.

CLINICAL DATA: Status post coronary bypass graft.

EXAM:
PORTABLE CHEST 1 VIEW

[Series 1: chest ap · 0.14mm/px · 2 of 2 slices shown]
[im 1/2]
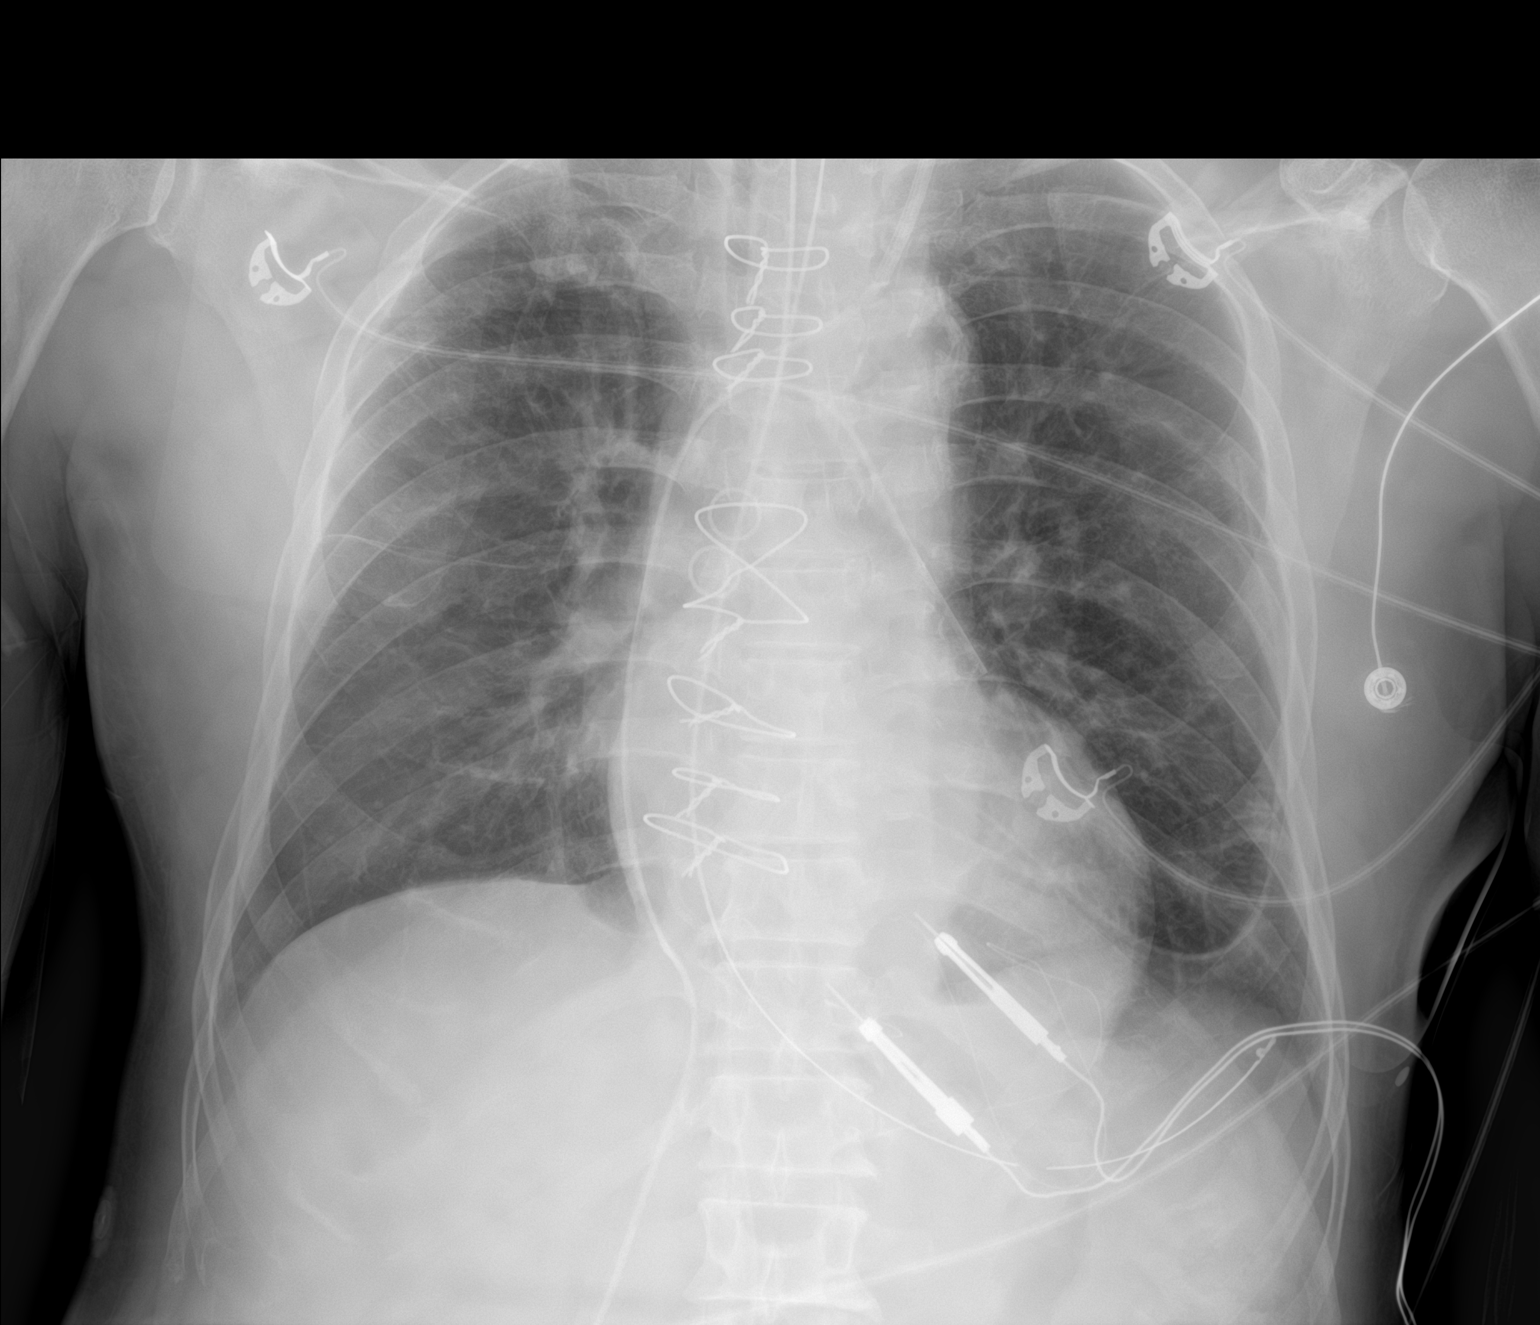
[im 2/2]
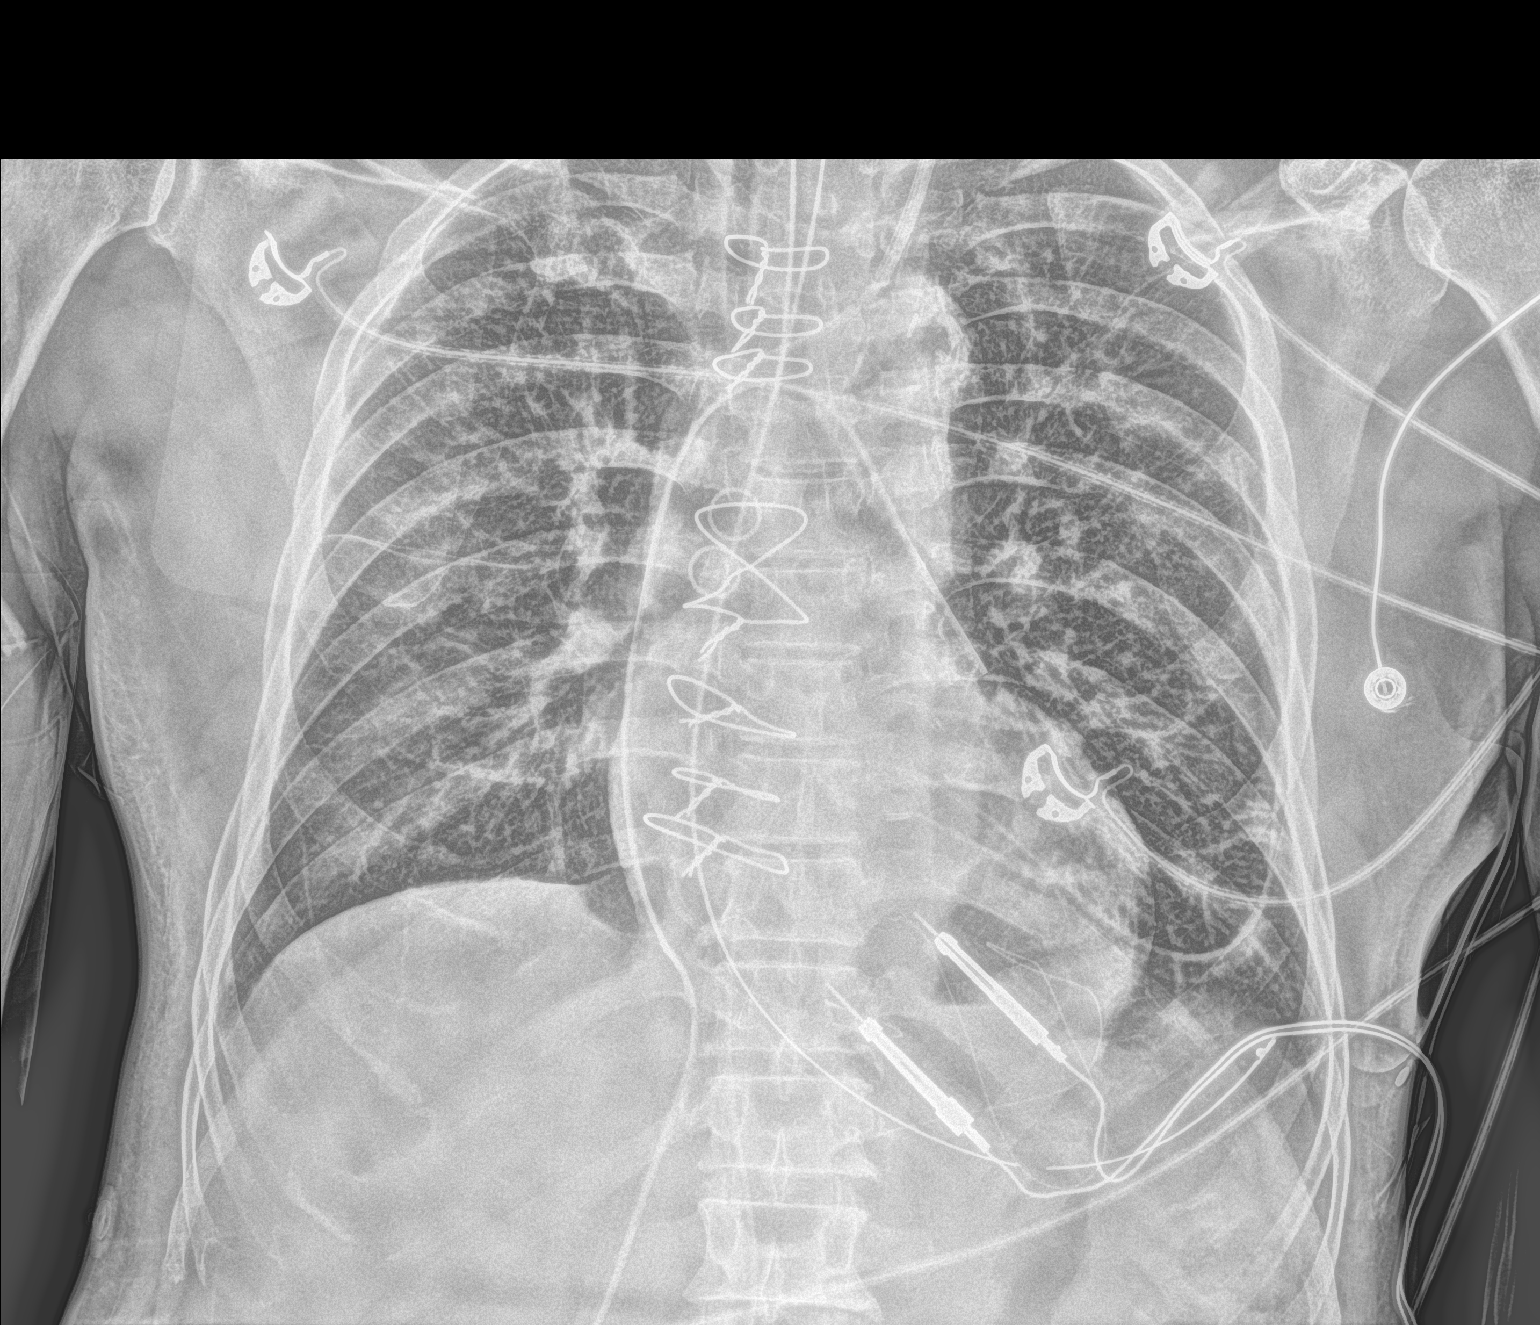

[2 of 2 positions shown; findings below may reference images not displayed]

FINDINGS: Endotracheal and nasogastric tubes appear to be in grossly good
position. Left internal jugular catheter tip is noted in left
brachiocephalic vein. Sternotomy wires are noted. Probable small
bore mediastinal drain is noted. No pneumothorax or pleural effusion
is noted. Mild bibasilar subsegmental atelectasis is noted. Bony
thorax is unremarkable.
IMPRESSION: Endotracheal and nasogastric tubes are in good position. Left
internal jugular catheter is noted with tip in expected position of
left brachiocephalic vein. Probable small bore mediastinal drain is
noted.

Mild bibasilar subsegmental atelectasis is noted.

## 2021-11-08 ENCOUNTER — Other Ambulatory Visit (INDEPENDENT_AMBULATORY_CARE_PROVIDER_SITE_OTHER): Payer: Medicare Other

## 2021-11-08 DIAGNOSIS — E1165 Type 2 diabetes mellitus with hyperglycemia: Secondary | ICD-10-CM | POA: Diagnosis not present

## 2021-11-08 DIAGNOSIS — Z794 Long term (current) use of insulin: Secondary | ICD-10-CM | POA: Diagnosis not present

## 2021-11-08 DIAGNOSIS — I2510 Atherosclerotic heart disease of native coronary artery without angina pectoris: Secondary | ICD-10-CM | POA: Diagnosis not present

## 2021-11-08 LAB — COMPREHENSIVE METABOLIC PANEL
ALT: 12 U/L (ref 0–53)
AST: 19 U/L (ref 0–37)
Albumin: 2.8 g/dL — ABNORMAL LOW (ref 3.5–5.2)
Alkaline Phosphatase: 58 U/L (ref 39–117)
BUN: 19 mg/dL (ref 6–23)
CO2: 31 mEq/L (ref 19–32)
Calcium: 9.1 mg/dL (ref 8.4–10.5)
Chloride: 108 mEq/L (ref 96–112)
Creatinine, Ser: 1.51 mg/dL — ABNORMAL HIGH (ref 0.40–1.50)
GFR: 46.16 mL/min — ABNORMAL LOW (ref >60.00)
Glucose, Bld: 72 mg/dL (ref 70–99)
Potassium: 4 mEq/L (ref 3.5–5.1)
Sodium: 143 mEq/L (ref 135–145)
Total Bilirubin: 0.3 mg/dL (ref 0.2–1.2)
Total Protein: 5.9 g/dL — ABNORMAL LOW (ref 6.0–8.3)

## 2021-11-08 LAB — BASIC METABOLIC PANEL
BUN: 19 mg/dL (ref 6–23)
CO2: 31 mEq/L (ref 19–32)
Calcium: 9.1 mg/dL (ref 8.4–10.5)
Chloride: 108 mEq/L (ref 96–112)
Creatinine, Ser: 1.51 mg/dL — ABNORMAL HIGH (ref 0.40–1.50)
GFR: 46.16 mL/min — ABNORMAL LOW (ref >60.00)
Glucose, Bld: 72 mg/dL (ref 70–99)
Potassium: 4 mEq/L (ref 3.5–5.1)
Sodium: 143 mEq/L (ref 135–145)

## 2021-11-08 LAB — LIPID PANEL
Cholesterol: 305 mg/dL — ABNORMAL HIGH (ref 0–200)
HDL: 62.7 mg/dL (ref >39.00)
LDL Cholesterol: 225 mg/dL — ABNORMAL HIGH (ref 0–99)
NonHDL: 242.01
Total CHOL/HDL Ratio: 5
Triglycerides: 84 mg/dL (ref 0.0–149.0)
VLDL: 16.8 mg/dL (ref 0.0–40.0)

## 2021-11-08 LAB — HEMOGLOBIN A1C: Hgb A1c MFr Bld: 11.4 % — ABNORMAL HIGH (ref 4.6–6.5)

## 2021-11-15 ENCOUNTER — Other Ambulatory Visit: Payer: Self-pay

## 2021-11-15 ENCOUNTER — Ambulatory Visit (INDEPENDENT_AMBULATORY_CARE_PROVIDER_SITE_OTHER): Payer: Medicare Other | Admitting: Endocrinology

## 2021-11-15 ENCOUNTER — Encounter: Payer: Self-pay | Admitting: Endocrinology

## 2021-11-15 VITALS — BP 164/100 | HR 86 | Ht 63.5 in | Wt 138.4 lb

## 2021-11-15 DIAGNOSIS — Z794 Long term (current) use of insulin: Secondary | ICD-10-CM

## 2021-11-15 DIAGNOSIS — E1165 Type 2 diabetes mellitus with hyperglycemia: Secondary | ICD-10-CM | POA: Diagnosis not present

## 2021-11-15 DIAGNOSIS — E785 Hyperlipidemia, unspecified: Secondary | ICD-10-CM

## 2021-11-15 DIAGNOSIS — I10 Essential (primary) hypertension: Secondary | ICD-10-CM

## 2021-11-15 MED ORDER — LOSARTAN POTASSIUM 100 MG PO TABS
100.0000 mg | ORAL_TABLET | Freq: Every day | ORAL | 1 refills | Status: DC
  Filled 2021-11-15: qty 90, 90d supply, fill #0

## 2021-11-15 MED ORDER — EMPAGLIFLOZIN 25 MG PO TABS
25.0000 mg | ORAL_TABLET | Freq: Every day | ORAL | 2 refills | Status: DC
  Filled 2021-11-15: qty 30, 30d supply, fill #0

## 2021-11-15 MED ORDER — FUROSEMIDE 40 MG PO TABS
40.0000 mg | ORAL_TABLET | Freq: Every day | ORAL | 3 refills | Status: DC
  Filled 2021-11-15: qty 30, 30d supply, fill #0

## 2021-11-15 MED ORDER — NOVOLIN 70/30 FLEXPEN (70-30) 100 UNIT/ML ~~LOC~~ SUPN
30.0000 [IU] | PEN_INJECTOR | Freq: Two times a day (BID) | SUBCUTANEOUS | 3 refills | Status: DC
Start: 2021-11-15 — End: 2022-03-14
  Filled 2021-11-15: qty 30, 50d supply, fill #0
  Filled 2022-01-10 – 2022-01-31 (×2): qty 30, 50d supply, fill #1

## 2021-11-15 NOTE — Patient Instructions (Addendum)
Take 30 units insulin before breakfast and dinner  Check blood sugars on waking up 3 days a week  Also check blood sugars about 2 hours after meals and do this after different meals by rotation  Recommended blood sugar levels on waking up are 90-130 and about 2 hours after meal is 130-180  Please bring your blood sugar monitor to each visit, thank you  Restart Losartan  See Dr Virgina Jock cardiology

## 2021-11-15 NOTE — Progress Notes (Signed)
Patient ID: Colin [name], male   DOB: 12/19/1950, 71 y.o.   MRN: [id number]           Reason for Appointment: Type II Diabetes follow-up   History of Present Illness   History was taken through the interpreter  Diagnosis date: 2006  Previous history:  He was started on insulin in 2021 and by history has been taking NPH, Lantus and NovoLog at various times However recent records indicate he has had persistently high A1c with the lowest reading only 8.4 in 9/22  Recent history:     Non-insulin hypoglycemic drugs: Jardiance 10 mg daily     Insulin regimen:   Humulin 70/30, 30 units bid    Side effects from medications: None  Current self management, blood sugar patterns and problems identified:  A1c is 11.4 compared to 13.6 Again he says that he is taking his insulin twice a day before meals However his blood sugar today is 290 even though it was 72 midmorning in the lab last week He has poor understanding about what makes his blood sugars go up and he thinks that certain meats like beef will make his blood sugar go up Again he does not check his blood sugars as he thinks he can tell by the way he feels if his sugar is high He does not report any typical hypoglycemic symptoms He has not been able to take Jardiance instead of [place]   Diet management: He is usually avoiding regular soft drinks      Monitors blood glucose: < Once a day.    Glucometer: One Touch.           Blood Glucose readings not available   Hypoglycemia:  none                        Dietician visit: Most recent:   None Weight control:  Wt Readings from Last 3 Encounters:  11/15/21 138 lb 6.4 oz (62.8 kg)  08/10/21 135 lb (61.2 kg)  06/29/21 134 lb (60.8 kg)            Diabetes labs:  Lab Results  Component Value Date   HGBA1C 11.4 (H) 11/08/2021   HGBA1C 13.6 (H) 06/26/2021   HGBA1C 15.0 (A) 03/29/2021   Lab Results  Component Value Date   MICROALBUR 373.3 (H) 06/29/2021   LDLCALC 225  (H) 11/08/2021   CREATININE 1.51 (H) 11/08/2021   CREATININE 1.51 (H) 11/08/2021     Allergies as of 11/15/2021   No Known Allergies      Medication List        Accurate as of November 15, 2021 10:19 AM. If you have any questions, ask your nurse or doctor.          Accu-Chek Guide test strip Generic drug: glucose blood Use twice daily. And lancets 2/day   Accu-Chek Guide w/Device Kit USE AS DIRECTED   Accu-Chek Softclix Lancets lancets Use to check blood sugar three times daily.   bisoprolol 5 MG tablet Commonly known as: ZEBETA Take 1 tablet (5 mg total) by mouth daily.   bisoprolol 5 MG tablet Commonly known as: ZEBETA take 1 tablet by mouth daily   clopidogrel 75 MG tablet Commonly known as: Plavix Take 1 tablet (75 mg total) by mouth daily.   clopidogrel 75 MG tablet Commonly known as: PLAVIX take 1 tablet by mouth daily   FreeStyle Libre 2 Sensor Misc 1 Device by Does not apply  route every 14 (fourteen) days.   furosemide 40 MG tablet Commonly known as: LASIX Take 1 tablet (40 mg total) by mouth daily.   gabapentin 100 MG capsule Commonly known as: NEURONTIN TAKE 1 CAPSULE (100 MG TOTAL) BY MOUTH 3 (THREE) TIMES DAILY.   ivabradine 5 MG Tabs tablet Commonly known as: CORLANOR Take 1 tablet (5 mg total) by mouth 2 (two) times daily with a meal.   Jardiance 10 MG Tabs tablet Generic drug: empagliflozin Take 1 tablet (10 mg total) by mouth daily with breakfast.   losartan 100 MG tablet Commonly known as: COZAAR Take 1 tablet (100 mg total) by mouth daily.   midodrine 2.5 MG tablet Commonly known as: PROAMATINE Take 1 tablet (2.5 mg total) by mouth 3 (three) times daily with meals.   nitroGLYCERIN 0.4 MG SL tablet Commonly known as: NITROSTAT Place 1 tablet (0.4 mg total) under the tongue every 5 (five) minutes as needed for chest pain.   nitroGLYCERIN 0.4 MG SL tablet Commonly known as: NITROSTAT Place 1 tab (0.$Remove'4mg'tHGncDe$  total) under the  tongue every 5 minutes as needed for chest pain   NovoLIN 70/30 Kwikpen (70-30) 100 UNIT/ML KwikPen Generic drug: insulin isophane & regular human KwikPen inject 30 units before meals twice daily   oxyCODONE 5 MG immediate release tablet Commonly known as: Oxy IR/ROXICODONE Take 1 tablet (5 mg total) by mouth every 6 (six) hours as needed for severe pain.   pantoprazole 20 MG tablet Commonly known as: PROTONIX Take 1 tablet (20 mg total) by mouth daily.   pantoprazole 20 MG tablet Commonly known as: PROTONIX Take 1 tab by mouth daily   rosuvastatin 40 MG tablet Commonly known as: CRESTOR Take 1 tablet by mouth once nightly at bedtime.   spironolactone 25 MG tablet Commonly known as: ALDACTONE Take 0.5 tablets (12.5 mg total) by mouth daily.   TechLite Pen Needles 32G X 4 MM Misc Generic drug: Insulin Pen Needle Use as instructed to inject Lantus and Victoza once daily.        Allergies: No Known Allergies  Past Medical History:  Diagnosis Date   Arrhythmia    CAD (coronary artery disease)    stent x 2   Carotid artery occlusion    Diabetes mellitus    Dyslipidemia    Essential hypertension     Past Surgical History:  Procedure Laterality Date   CARDIAC CATHETERIZATION     01/2010 ; 04/2010   CORONARY ARTERY BYPASS GRAFT N/A 08/23/2020   Procedure: CORONARY ARTERY BYPASS GRAFTING (CABG) x 3 ON CARDIOPULMONARY BYPASS USING EVH AND PARTIALLY OPEN HARVESTED LEFT GREATER SAPHENOUS VEIN AND LEFT INTERNAL MAMMARY ARTERY.  LIMA TO LAD, SVG TO OM, SVG TO PDA;  Surgeon: Lajuana Matte, MD;  Location: [place];  Service: Open Heart Surgery;  Laterality: N/A;   RIGHT/LEFT HEART CATH AND CORONARY ANGIOGRAPHY N/A 08/22/2020   Procedure: RIGHT/LEFT HEART CATH AND CORONARY ANGIOGRAPHY;  Surgeon: Nigel Mormon, MD;  Location: [place] [facility];  Service: Cardiovascular;  Laterality: N/A;   TEE WITHOUT CARDIOVERSION N/A 08/23/2020   Procedure: TRANSESOPHAGEAL  ECHOCARDIOGRAM (TEE);  Surgeon: Lajuana Matte, MD;  Location: [place];  Service: Open Heart Surgery;  Laterality: N/A;   TEMPORARY PACEMAKER N/A 08/22/2020   Procedure: TEMPORARY PACEMAKER;  Surgeon: Nigel Mormon, MD;  Location: [place] [facility];  Service: Cardiovascular;  Laterality: N/A;   VEIN HARVEST Left 08/23/2020   Procedure: ENDOVEIN HARVEST AND PARTIALLY OPEN GREATER SAPHENOUS VEIN HARVESTING;  Surgeon: Lajuana Matte, MD;  Location: [place];  Service: Open Heart Surgery;  Laterality: Left;    Family History  Problem Relation Age of Onset   Diabetes Neg Hx     Social History:  reports that he quit smoking about 15 months ago. His smoking use included cigarettes. He has a 12.50 pack-year smoking history. He has never used smokeless tobacco. He reports that he does not drink alcohol and does not use drugs.  Review of Systems:  Last diabetic eye exam date unknown  Last foot exam date: 9/22  Occasionally may have some numbness in his feet    Hypertension: He has been managed with Entresto and metoprolol previously his cardiologist  Blood pressure was previously improved with starting losartan in 5/23 but he has not taken this lately  BP Readings from Last 3 Encounters:  11/15/21 (!) 164/100  08/10/21 (!) 142/68  06/29/21 (!) 170/80    Lipids: He was given Crestor by his cardiologist but he has not taken this again    Lab Results  Component Value Date   CHOL 305 (H) 11/08/2021   CHOL 279 (H) 11/11/2020   CHOL 187 08/21/2020   Lab Results  Component Value Date   HDL 62.70 11/08/2021   HDL 61 11/11/2020   HDL 40 (L) 08/21/2020   Lab Results  Component Value Date   LDLCALC 225 (H) 11/08/2021   LDLCALC 207 (H) 11/11/2020   LDLCALC 136 (H) 08/21/2020   Lab Results  Component Value Date   TRIG 84.0 11/08/2021   TRIG 71 11/11/2020   TRIG 53 08/21/2020   Lab Results  Component Value Date   CHOLHDL 5 11/08/2021   CHOLHDL 4.6 11/11/2020    CHOLHDL 4.7 08/21/2020   Lab Results  Component Value Date   LDLDIRECT 137.1 (H) 08/21/2020    LEG edema: He is taking Lasix with improvement   Examination:   BP (!) 164/100   Pulse 86   Ht 5' 3.5" (1.613 m)   Wt 138 lb 6.4 oz (62.8 kg)   SpO2 98%   BMI 24.13 kg/m   Body mass index is 24.13 kg/m.   No significant ankle edema  ASSESSMENT/ PLAN:    Diabetes type 2 with nephropathy:   Current regimen: 70/30 insulin twice a day  Blood glucose control is persistently poor  A1c is still high at 11.4  Glucose is 298 today even though he did not eat breakfast indicating likely he did not take his insulin consistently in the last day or 2 Lab glucose was 72 last week No hypoglycemia reported Unable to adjust his insulin because of lack of glucose monitoring at home   Recommendations:  Continue the same INSULIN 30 UNITS before breakfast and 30 units before  dinner Will have him start checking his blood sugar and discussed the importance of doing this Not clear if he is benefiting from Jardiance 10 mg since A1c is not much better  HYPERTENSION: Currently not on losartan 100 mg and blood pressure has gone up again  Also needs to follow-up with his cardiologist with other issues like edema, name given  History of hyperlipidemia: LDL significantly high, will start back on Crestor 40 mg daily and new prescription sent  There are no Patient Instructions on file for this visit.   Elayne Snare 11/15/2021, 10:19 AM   Renal: Urine microalbumin 629, previously 903, will continue to follow

## 2021-11-30 ENCOUNTER — Telehealth: Payer: Self-pay | Admitting: Dietician

## 2021-11-30 NOTE — Telephone Encounter (Signed)
Called patient who needs CGM training. CAP interpretor was used.   Unable to reach patient.   [place] office has called him 4 other times as well with no response.  Unable to complete referral for CGM training at this time.  Antonieta Iba, RD, LDN, CDCES

## 2021-12-16 ENCOUNTER — Emergency Department (HOSPITAL_COMMUNITY): Payer: Medicare Other

## 2021-12-16 ENCOUNTER — Encounter (HOSPITAL_COMMUNITY): Payer: Self-pay | Admitting: Emergency Medicine

## 2021-12-16 ENCOUNTER — Other Ambulatory Visit: Payer: Self-pay

## 2021-12-16 ENCOUNTER — Inpatient Hospital Stay (HOSPITAL_COMMUNITY)
Admission: EM | Admit: 2021-12-16 | Discharge: 2021-12-24 | DRG: 286 | Disposition: A | Discharge status: Home Health | Payer: Medicare Other | Attending: Internal Medicine | Admitting: Internal Medicine

## 2021-12-16 DIAGNOSIS — J449 Chronic obstructive pulmonary disease, unspecified: Secondary | ICD-10-CM | POA: Diagnosis present

## 2021-12-16 DIAGNOSIS — E119 Type 2 diabetes mellitus without complications: Secondary | ICD-10-CM

## 2021-12-16 DIAGNOSIS — E785 Hyperlipidemia, unspecified: Secondary | ICD-10-CM

## 2021-12-16 DIAGNOSIS — N179 Acute kidney failure, unspecified: Secondary | ICD-10-CM | POA: Diagnosis present

## 2021-12-16 DIAGNOSIS — Z955 Presence of coronary angioplasty implant and graft: Secondary | ICD-10-CM

## 2021-12-16 DIAGNOSIS — Z7902 Long term (current) use of antithrombotics/antiplatelets: Secondary | ICD-10-CM

## 2021-12-16 DIAGNOSIS — Z87891 Personal history of nicotine dependence: Secondary | ICD-10-CM

## 2021-12-16 DIAGNOSIS — Z7984 Long term (current) use of oral hypoglycemic drugs: Secondary | ICD-10-CM

## 2021-12-16 DIAGNOSIS — I130 Hypertensive heart and chronic kidney disease with heart failure and stage 1 through stage 4 chronic kidney disease, or unspecified chronic kidney disease: Secondary | ICD-10-CM | POA: Diagnosis not present

## 2021-12-16 DIAGNOSIS — E1142 Type 2 diabetes mellitus with diabetic polyneuropathy: Secondary | ICD-10-CM | POA: Diagnosis not present

## 2021-12-16 DIAGNOSIS — Z79899 Other long term (current) drug therapy: Secondary | ICD-10-CM

## 2021-12-16 DIAGNOSIS — E8809 Other disorders of plasma-protein metabolism, not elsewhere classified: Secondary | ICD-10-CM | POA: Diagnosis present

## 2021-12-16 DIAGNOSIS — I252 Old myocardial infarction: Secondary | ICD-10-CM

## 2021-12-16 DIAGNOSIS — E782 Mixed hyperlipidemia: Secondary | ICD-10-CM | POA: Diagnosis present

## 2021-12-16 DIAGNOSIS — I5023 Acute on chronic systolic (congestive) heart failure: Secondary | ICD-10-CM

## 2021-12-16 DIAGNOSIS — R0902 Hypoxemia: Principal | ICD-10-CM

## 2021-12-16 DIAGNOSIS — Z1152 Encounter for screening for COVID-19: Secondary | ICD-10-CM

## 2021-12-16 DIAGNOSIS — I25110 Atherosclerotic heart disease of native coronary artery with unstable angina pectoris: Secondary | ICD-10-CM | POA: Diagnosis present

## 2021-12-16 DIAGNOSIS — N1831 Chronic kidney disease, stage 3a: Secondary | ICD-10-CM | POA: Diagnosis present

## 2021-12-16 DIAGNOSIS — E8770 Fluid overload, unspecified: Secondary | ICD-10-CM

## 2021-12-16 DIAGNOSIS — E1165 Type 2 diabetes mellitus with hyperglycemia: Secondary | ICD-10-CM | POA: Diagnosis present

## 2021-12-16 DIAGNOSIS — I255 Ischemic cardiomyopathy: Secondary | ICD-10-CM | POA: Diagnosis present

## 2021-12-16 DIAGNOSIS — E1169 Type 2 diabetes mellitus with other specified complication: Secondary | ICD-10-CM | POA: Diagnosis present

## 2021-12-16 DIAGNOSIS — I10 Essential (primary) hypertension: Secondary | ICD-10-CM | POA: Diagnosis present

## 2021-12-16 DIAGNOSIS — E1122 Type 2 diabetes mellitus with diabetic chronic kidney disease: Secondary | ICD-10-CM | POA: Diagnosis present

## 2021-12-16 DIAGNOSIS — J9601 Acute respiratory failure with hypoxia: Secondary | ICD-10-CM | POA: Insufficient documentation

## 2021-12-16 DIAGNOSIS — Z794 Long term (current) use of insulin: Secondary | ICD-10-CM

## 2021-12-16 DIAGNOSIS — Z951 Presence of aortocoronary bypass graft: Secondary | ICD-10-CM

## 2021-12-16 DIAGNOSIS — E1151 Type 2 diabetes mellitus with diabetic peripheral angiopathy without gangrene: Secondary | ICD-10-CM | POA: Diagnosis present

## 2021-12-16 DIAGNOSIS — J441 Chronic obstructive pulmonary disease with (acute) exacerbation: Secondary | ICD-10-CM | POA: Diagnosis present

## 2021-12-16 DIAGNOSIS — I2510 Atherosclerotic heart disease of native coronary artery without angina pectoris: Secondary | ICD-10-CM | POA: Diagnosis present

## 2021-12-16 HISTORY — DX: Acute respiratory failure with hypoxia: J96.01

## 2021-12-16 HISTORY — DX: Acute on chronic systolic (congestive) heart failure: I50.23

## 2021-12-16 LAB — BASIC METABOLIC PANEL
Anion gap: 10 (ref 5–15)
BUN: 29 mg/dL — ABNORMAL HIGH (ref 8–23)
CO2: 22 mmol/L (ref 22–32)
Calcium: 8.3 mg/dL — ABNORMAL LOW (ref 8.9–10.3)
Chloride: 105 mmol/L (ref 98–111)
Creatinine, Ser: 2.06 mg/dL — ABNORMAL HIGH (ref 0.61–1.24)
GFR, Estimated: 34 mL/min — ABNORMAL LOW (ref >60)
Glucose, Bld: 369 mg/dL — ABNORMAL HIGH (ref 70–99)
Potassium: 4 mmol/L (ref 3.5–5.1)
Sodium: 137 mmol/L (ref 135–145)

## 2021-12-16 LAB — URINALYSIS, ROUTINE W REFLEX MICROSCOPIC
Bacteria, UA: NONE SEEN
Bilirubin Urine: NEGATIVE
Glucose, UA: >=500 mg/dL — AB
Ketones, ur: 20 mg/dL — AB
Leukocytes,Ua: NEGATIVE
Nitrite: NEGATIVE
Protein, ur: >=300 mg/dL — AB
Specific Gravity, Urine: 1.032 — ABNORMAL HIGH (ref 1.005–1.030)
pH: 5 (ref 5.0–8.0)

## 2021-12-16 LAB — TROPONIN I (HIGH SENSITIVITY)
Troponin I (High Sensitivity): 191 ng/L (ref <18)
Troponin I (High Sensitivity): 228 ng/L (ref <18)

## 2021-12-16 LAB — RESP PANEL BY RT-PCR (FLU A&B, COVID) ARPGX2
Influenza A by PCR: NEGATIVE
Influenza B by PCR: NEGATIVE
SARS Coronavirus 2 by RT PCR: NEGATIVE

## 2021-12-16 LAB — CBC WITH DIFFERENTIAL/PLATELET
Abs Immature Granulocytes: 0.03 10*3/uL (ref 0.00–0.07)
Basophils Absolute: 0.1 10*3/uL (ref 0.0–0.1)
Basophils Relative: 1 %
Eosinophils Absolute: 0 10*3/uL (ref 0.0–0.5)
Eosinophils Relative: 0 %
HCT: 41.8 % (ref 39.0–52.0)
Hemoglobin: 13.7 g/dL (ref 13.0–17.0)
Immature Granulocytes: 0 %
Lymphocytes Relative: 7 %
Lymphs Abs: 0.8 10*3/uL (ref 0.7–4.0)
MCH: 30.2 pg (ref 26.0–34.0)
MCHC: 32.8 g/dL (ref 30.0–36.0)
MCV: 92.3 fL (ref 80.0–100.0)
Monocytes Absolute: 0.9 10*3/uL (ref 0.1–1.0)
Monocytes Relative: 8 %
Neutro Abs: 9.2 10*3/uL — ABNORMAL HIGH (ref 1.7–7.7)
Neutrophils Relative %: 84 %
Platelets: 230 10*3/uL (ref 150–400)
RBC: 4.53 MIL/uL (ref 4.22–5.81)
RDW: 12.5 % (ref 11.5–15.5)
WBC: 11.1 10*3/uL — ABNORMAL HIGH (ref 4.0–10.5)
nRBC: 0 % (ref 0.0–0.2)

## 2021-12-16 LAB — GLUCOSE, CAPILLARY: Glucose-Capillary: 209 mg/dL — ABNORMAL HIGH (ref 70–99)

## 2021-12-16 LAB — MAGNESIUM: Magnesium: 1.9 mg/dL (ref 1.7–2.4)

## 2021-12-16 LAB — CBG MONITORING, ED: Glucose-Capillary: 364 mg/dL — ABNORMAL HIGH (ref 70–99)

## 2021-12-16 LAB — BRAIN NATRIURETIC PEPTIDE: B Natriuretic Peptide: 2479.6 pg/mL — ABNORMAL HIGH (ref 0.0–100.0)

## 2021-12-16 MED ORDER — ROSUVASTATIN CALCIUM 20 MG PO TABS
40.0000 mg | ORAL_TABLET | Freq: Every day | ORAL | Status: DC
  Administered 2021-12-17 – 2021-12-20 (×4): 40 mg via ORAL
  Filled 2021-12-16 (×4): qty 2

## 2021-12-16 MED ORDER — INSULIN ASPART PROT & ASPART (70-30 MIX) 100 UNIT/ML ~~LOC~~ SUSP
20.0000 [IU] | Freq: Two times a day (BID) | SUBCUTANEOUS | Status: DC
  Filled 2021-12-16: qty 10

## 2021-12-16 MED ORDER — SODIUM CHLORIDE 0.9% FLUSH
3.0000 mL | Freq: Two times a day (BID) | INTRAVENOUS | Status: DC
  Administered 2021-12-16 – 2021-12-24 (×10): 3 mL via INTRAVENOUS

## 2021-12-16 MED ORDER — ALBUTEROL SULFATE HFA 108 (90 BASE) MCG/ACT IN AERS
2.0000 | INHALATION_SPRAY | Freq: Once | RESPIRATORY_TRACT | Status: AC
  Administered 2021-12-16: 2 via RESPIRATORY_TRACT
  Filled 2021-12-16: qty 6.7

## 2021-12-16 MED ORDER — ENOXAPARIN SODIUM 30 MG/0.3ML IJ SOSY
30.0000 mg | PREFILLED_SYRINGE | INTRAMUSCULAR | Status: DC
  Administered 2021-12-16 – 2021-12-23 (×8): 30 mg via SUBCUTANEOUS
  Filled 2021-12-16 (×8): qty 0.3

## 2021-12-16 MED ORDER — FUROSEMIDE 10 MG/ML IJ SOLN
40.0000 mg | Freq: Two times a day (BID) | INTRAMUSCULAR | Status: DC
  Administered 2021-12-17: 40 mg via INTRAVENOUS
  Filled 2021-12-16: qty 4

## 2021-12-16 MED ORDER — POLYETHYLENE GLYCOL 3350 17 G PO PACK: 17.0000 g | PACK | Freq: Every day | ORAL | Status: DC | PRN

## 2021-12-16 MED ORDER — INSULIN ASPART 100 UNIT/ML IJ SOLN
0.0000 [IU] | Freq: Three times a day (TID) | INTRAMUSCULAR | Status: DC
  Administered 2021-12-16: 9 [IU] via SUBCUTANEOUS
  Administered 2021-12-17: 5 [IU] via SUBCUTANEOUS
  Administered 2021-12-17: 3 [IU] via SUBCUTANEOUS
  Administered 2021-12-18 – 2021-12-19 (×2): 1 [IU] via SUBCUTANEOUS
  Administered 2021-12-19: 7 [IU] via SUBCUTANEOUS
  Administered 2021-12-20 (×2): 9 [IU] via SUBCUTANEOUS
  Administered 2021-12-20: 7 [IU] via SUBCUTANEOUS
  Administered 2021-12-21: 9 [IU] via SUBCUTANEOUS
  Administered 2021-12-21: 7 [IU] via SUBCUTANEOUS
  Administered 2021-12-21: 5 [IU] via SUBCUTANEOUS
  Administered 2021-12-22 – 2021-12-23 (×3): 3 [IU] via SUBCUTANEOUS
  Administered 2021-12-23: 2 [IU] via SUBCUTANEOUS
  Administered 2021-12-23 – 2021-12-24 (×2): 5 [IU] via SUBCUTANEOUS
  Administered 2021-12-24: 2 [IU] via SUBCUTANEOUS

## 2021-12-16 MED ORDER — ACETAMINOPHEN 325 MG PO TABS: 650.0000 mg | ORAL_TABLET | Freq: Four times a day (QID) | ORAL | Status: DC | PRN

## 2021-12-16 MED ORDER — FUROSEMIDE 10 MG/ML IJ SOLN
40.0000 mg | Freq: Once | INTRAMUSCULAR | Status: AC
  Administered 2021-12-16: 40 mg via INTRAVENOUS
  Filled 2021-12-16: qty 4

## 2021-12-16 MED ORDER — ACETAMINOPHEN 650 MG RE SUPP: 650.0000 mg | Freq: Four times a day (QID) | RECTAL | Status: DC | PRN

## 2021-12-16 NOTE — ED Notes (Signed)
Checked patient cbg it was 364 patient is resting with call bell in reach

## 2021-12-16 NOTE — ED Provider Notes (Signed)
[facility] Provider Note   CSN: [id number] Arrival date & time: 12/16/21  1238     History  Chief Complaint  Patient presents with   Chest Pain    Colin [name] is a 71 y.o. male.  The history is provided by the patient and medical records. The history is limited by a language barrier. Language interpreter used: family wanted to intepret.  Chest Pain Pain location:  Substernal area Pain quality: aching   Pain radiates to:  Does not radiate Pain severity:  Moderate Onset quality:  Gradual Duration:  3 days Timing:  Constant Progression:  Waxing and waning Chronicity:  New Relieved by:  Nothing Worsened by:  Nothing Associated symptoms: cough, fatigue, lower extremity edema and shortness of breath   Associated symptoms: no abdominal pain, no altered mental status, no back pain, no fever, no headache, no nausea, no palpitations and no vomiting   Risk factors: coronary artery disease and hypertension        Home Medications Prior to Admission medications   Medication Sig Start Date End Date Taking? Authorizing Provider  Accu-Chek Softclix Lancets lancets Use to check blood sugar three times daily. Patient not taking: Reported on 06/29/2021 10/14/20   Elgie Collard, PA-C  bisoprolol (ZEBETA) 5 MG tablet Take 1 tablet (5 mg total) by mouth daily. Patient not taking: Reported on 06/29/2021 11/11/20   Nigel Mormon, MD  bisoprolol (ZEBETA) 5 MG tablet take 1 tablet by mouth daily Patient not taking: Reported on 06/29/2021 11/11/20   Nigel Mormon, MD  Blood Glucose Monitoring Suppl (ACCU-CHEK GUIDE) w/Device KIT USE AS DIRECTED 10/14/20   Elgie Collard, PA-C  clopidogrel (PLAVIX) 75 MG tablet Take 1 tablet (75 mg total) by mouth daily. Patient not taking: Reported on 06/29/2021 11/11/20   Nigel Mormon, MD  clopidogrel (PLAVIX) 75 MG tablet take 1 tablet by mouth daily Patient not taking: Reported on 06/29/2021 11/11/20    Nigel Mormon, MD  Continuous Blood Gluc Sensor (FREESTYLE LIBRE 2 SENSOR) MISC 1 Device by Does not apply route every 14 (fourteen) days. Patient not taking: Reported on 06/29/2021 02/22/21   Renato Shin, MD  empagliflozin (JARDIANCE) 25 MG TABS tablet Take 1 tablet (25 mg total) by mouth daily before breakfast. 11/15/21   Elayne Snare, MD  furosemide (LASIX) 40 MG tablet Take 1 tablet (40 mg total) by mouth daily. 11/15/21   Elayne Snare, MD  gabapentin (NEURONTIN) 100 MG capsule TAKE 1 CAPSULE (100 MG TOTAL) BY MOUTH 3 (THREE) TIMES DAILY. Patient not taking: Reported on 06/29/2021 11/02/20 11/02/21  Kerin Perna, NP  glucose blood (ACCU-CHEK GUIDE) test strip Use twice daily. And lancets 2/day 05/03/21   Renato Shin, MD  insulin isophane & regular human KwikPen (NOVOLIN 70/30 KWIKPEN) (70-30) 100 UNIT/ML KwikPen Inject 30 Units into the skin 2 (two) times daily before a meal. 11/15/21   Elayne Snare, MD  Insulin Pen Needle (TECHLITE PEN NEEDLES) 32G X 4 MM MISC Use as instructed to inject Lantus and Victoza once daily. 10/14/20   Elgie Collard, PA-C  ivabradine (CORLANOR) 5 MG TABS tablet Take 1 tablet (5 mg total) by mouth 2 (two) times daily with a meal. 08/31/20   Elgie Collard, PA-C  losartan (COZAAR) 100 MG tablet Take 1 tablet (100 mg total) by mouth daily. 11/15/21   Elayne Snare, MD  midodrine (PROAMATINE) 2.5 MG tablet Take 1 tablet (2.5 mg total) by mouth 3 (three)  times daily with meals. 08/31/20   Elgie Collard, PA-C  nitroGLYCERIN (NITROSTAT) 0.4 MG SL tablet Place 1 tablet (0.4 mg total) under the tongue every 5 (five) minutes as needed for chest pain. 11/11/20 02/09/21  Patwardhan, Reynold Bowen, MD  nitroGLYCERIN (NITROSTAT) 0.4 MG SL tablet Place 1 tab (0.68m total) under the tongue every 5 minutes as needed for chest pain 11/11/20   Patwardhan, Manish J, MD  oxyCODONE (OXY IR/ROXICODONE) 5 MG immediate release tablet Take 1 tablet (5 mg total) by mouth every 6 (six) hours as needed  for severe pain. 08/31/20   CElgie Collard PA-C  pantoprazole (PROTONIX) 20 MG tablet Take 1 tablet (20 mg total) by mouth daily. 11/11/20   Patwardhan, MReynold Bowen MD  pantoprazole (PROTONIX) 20 MG tablet Take 1 tab by mouth daily 11/11/20   Patwardhan, Manish J, MD  rosuvastatin (CRESTOR) 40 MG tablet Take 1 tablet by mouth once nightly at bedtime. 08/10/21   KElayne Snare MD  spironolactone (ALDACTONE) 25 MG tablet Take 0.5 tablets (12.5 mg total) by mouth daily. 09/01/20   CElgie Collard PA-C      Allergies    Patient has no known allergies.    Review of Systems   Review of Systems  Constitutional:  Positive for chills and fatigue. Negative for fever.  HENT:  Positive for congestion.   Respiratory:  Positive for cough, chest tightness, shortness of breath and wheezing.   Cardiovascular:  Positive for chest pain and leg swelling. Negative for palpitations.  Gastrointestinal:  Negative for abdominal pain, constipation, diarrhea, nausea and vomiting.  Genitourinary:  Positive for dysuria.  Musculoskeletal:  Negative for back pain.  Neurological:  Negative for headaches.  Psychiatric/Behavioral:  Negative for agitation.   All other systems reviewed and are negative.   Physical Exam Updated Vital Signs BP (!) 159/65 (BP Location: Right Arm)   Pulse 96   Temp 99.5 F (37.5 C) (Oral)   SpO2 91%  Physical Exam Vitals and nursing note reviewed.  Constitutional:      General: He is not in acute distress.    Appearance: He is well-developed. He is not ill-appearing, toxic-appearing or diaphoretic.  HENT:     Head: Normocephalic and atraumatic.  Eyes:     Extraocular Movements: Extraocular movements intact.     Conjunctiva/sclera: Conjunctivae normal.     Pupils: Pupils are equal, round, and reactive to light.  Cardiovascular:     Rate and Rhythm: Normal rate and regular rhythm.     Heart sounds: Normal heart sounds. No murmur heard. Pulmonary:     Effort: Pulmonary effort is normal.  Tachypnea present. No respiratory distress.     Breath sounds: Wheezing, rhonchi and rales present.  Chest:     Chest wall: No tenderness.  Abdominal:     Palpations: Abdomen is soft.     Tenderness: There is no abdominal tenderness.  Musculoskeletal:        General: No swelling.     Cervical back: Neck supple.     Right lower leg: No tenderness. Edema present.     Left lower leg: No tenderness. Edema present.  Skin:    General: Skin is warm and dry.     Capillary Refill: Capillary refill takes less than 2 seconds.  Neurological:     General: No focal deficit present.     Mental Status: He is alert.  Psychiatric:        Mood and Affect: Mood normal.  ED Results / Procedures / Treatments   Labs (all labs ordered are listed, but only abnormal results are displayed) Labs Reviewed  CBC WITH DIFFERENTIAL/PLATELET - Abnormal; Notable for the following components:      Result Value   WBC 11.1 (*)    Neutro Abs 9.2 (*)    All other components within normal limits  BASIC METABOLIC PANEL - Abnormal; Notable for the following components:   Glucose, Bld 369 (*)    BUN 29 (*)    Creatinine, Ser 2.06 (*)    Calcium 8.3 (*)    GFR, Estimated 34 (*)    All other components within normal limits  BRAIN NATRIURETIC PEPTIDE - Abnormal; Notable for the following components:   B Natriuretic Peptide 2,479.6 (*)    All other components within normal limits  URINALYSIS, ROUTINE W REFLEX MICROSCOPIC - Abnormal; Notable for the following components:   APPearance HAZY (*)    Specific Gravity, Urine 1.032 (*)    Glucose, UA >=500 (*)    Hgb urine dipstick MODERATE (*)    Ketones, ur 20 (*)    Protein, ur >=300 (*)    All other components within normal limits  TROPONIN I (HIGH SENSITIVITY) - Abnormal; Notable for the following components:   Troponin I (High Sensitivity) 191 (*)    All other components within normal limits  RESP PANEL BY RT-PCR (FLU A&B, COVID) ARPGX2  TROPONIN I (HIGH  SENSITIVITY)    EKG EKG Interpretation  Date/Time:  Saturday December 16 2021 13:24:26 EST Ventricular Rate:  93 PR Interval:  178 QRS Duration: 110 QT Interval:  346 QTC Calculation: 430 R Axis:   142 Text Interpretation: Sinus rhythm with occasional Premature ventricular complexes Right axis deviation Anteroseptal infarct , age undetermined ST & T wave abnormality, consider inferolateral ischemia Abnormal ECG When compared with ECG of 29-Aug-2020 12:38, PREVIOUS ECG IS PRESENT when compared to prior,  new PVC and some new t wave inversion in lead 3 and AVF. NO STEMI Confirmed by Antony Blackbird [phone]) on 12/16/2021 1:41:41 PM  Radiology DG Chest Portable 1 View  Result Date: 12/16/2021 CLINICAL DATA:  Respiratory distress, chest pain EXAM: PORTABLE CHEST 1 VIEW COMPARISON:  10/14/2020 FINDINGS: Cardiomegaly status post median sternotomy. Mild, diffuse bilateral interstitial pulmonary opacity. The visualized skeletal structures are unremarkable. IMPRESSION: Cardiomegaly with mild, diffuse bilateral interstitial pulmonary opacity, likely edema. No focal airspace opacity. Electronically Signed   By: Delanna Ahmadi M.D.   On: 12/16/2021 14:26    Procedures Procedures    Medications Ordered in ED Medications  furosemide (LASIX) injection 40 mg (has no administration in time range)  albuterol (VENTOLIN HFA) 108 (90 Base) MCG/ACT inhaler 2 puff (2 puffs Inhalation Given 12/16/21 1432)    ED Course/ Medical Decision Making/ A&P                           Medical Decision Making Amount and/or Complexity of Data Reviewed Radiology: ordered.  Risk Prescription drug management. Decision regarding hospitalization.    Colin [name] is a 71 y.o. male with a past medical history significant for hypertension, dyslipidemia, diabetes, previous CAD with NSTEMI, CHF, and previous CABG who presents with several days of shortness of breath, productive cough, and chronic edema in extremities.   Patient also reportedly told triage that he was having some dysuria.  Patient has had some chills but has not had fevers at home.  Patient has denied nausea, vomiting, constipation,  or diarrhea.  Patient is staying at home with family and they did not report any sick contacts.  Of note, patient was on to be hypoxic in triage and does not take oxygen at home.  Patient was tachypneic and was not tachycardic on initial evaluation with me.  On my exam, lungs did have some wheezing, coarseness, and rales.  Chest was nontender.  Did not appreciate a murmur initially.  Intact pulses in extremities and he did have some very faint edema in legs bilateral that they report is more chronic.  Exam otherwise unremarkable.  Patient's son provided montagnard interpretation at the bedside.  EKG did not show STEMI but did show some T wave inversions compared to prior.  Clinically I am most concerned about pneumonia versus COVID versus heart failure exacerbation given description of the patient's symptoms today.  Given his new hypoxia, I do anticipate he will need admission regardless of rest of the work-up.  Initial troponin is elevated at 191, will trend.  This is down from last year when he was having his previous MI.  BNP is much more elevated than prior up at 2400 now.  Metabolic panel shows a new AKI but otherwise electrolytes only showed mild hypocalcemia.  Urinalysis did not show evidence of UTI today.  CBC shows mild leukocytosis but no anemia and normal platelets.  Patient will get his chest x-ray and then we will discuss with cardiology given his EKG changes and lab abnormalities.  We will also await COVID swab and he will be admitted.   COVID and flu negative.  Chest x-ray does not show pneumonia but does show pulmonary edema.  Clinically concerned about heart failure exacerbation.  We will call cardiology and he sees [facility].  Anticipate that they will admit for diuresis and further  management.  Just spoke to a cardiology team member from [facility] and they do feel he needs admission to the hospital for diuresis given his new oxygen requirement.  They will see the patient in consultation likely in the morning and wanted 40 of IV Lasix twice a day until they see him tomorrow.  They agree with medicine admission given the suspected viral URI as well as the AKI.  Patient will be admitted for further management.         Final Clinical Impression(s) / ED Diagnoses Final diagnoses:  Hypoxia  Hypervolemia, unspecified hypervolemia type     Clinical Impression: 1. Hypoxia   2. Hypervolemia, unspecified hypervolemia type     Disposition: Admit  This note was prepared with assistance of Dragon voice recognition software. Occasional wrong-word or sound-a-like substitutions may have occurred due to the inherent limitations of voice recognition software.      Demari Kropp, Gwenyth Allegra, MD 12/16/21 [phone]

## 2021-12-16 NOTE — H&P (Addendum)
History and Physical   Colin [name] GYI:[id number] DOB: [birth year] DOA: 12/16/2021  PCP: Kerin Perna, NP   Patient coming from: Home  Chief Complaint: Shortness of breath  HPI: Colin [name] is a 71 y.o. male with medical history significant of CAD status post CABG, diabetes, hyperlipidemia, CHF with reduced ejection fraction presenting with chest pain and shortness of breath.  History obtained with assistance of chart review and family due to language barrier.  Patient able to communicate in basic terms in [place].  Patient has been experiencing 3 days of increasing shortness of breath with chronic edema also noting of chest pain and a productive cough.   No reports of fevers, chills,  abdominal pain, constipation, diarrhea, nausea, vomiting.  ED Course: Signs in the ED significant for blood pressure in the 350K 938H systolic, requiring 4 L to maintain saturations and desaturating to the low 80s on room air.  Lab work-up included BMP with BUN 29 creatinine elevated 2.26 from baseline 1.4, glucose 369, albumin 8.3.  CBC with mild leukocytosis 11.1.  Troponin elevated at 191 with repeat pending.  BNP elevated to 2479.  Respiratory panel for flu and COVID-negative.  Urinalysis with glucose, hemoglobin, ketones, protein only.  Chest x-ray showed cardiomegaly with bilateral interstitial opacities consistent with edema.  Patient received IV Lasix and albuterol in the ED.  Cardiology, [facility] cardiology, was consulted and will see the patient tomorrow.  Recommend 40 mg IV Lasix twice daily and believe this is primarily CHF treatment.  Review of Systems: As per HPI otherwise all other systems reviewed and are negative.  Past Medical History:  Diagnosis Date   Arrhythmia    CAD (coronary artery disease)    stent x 2   Carotid artery occlusion    Diabetes mellitus    Dyslipidemia    Essential hypertension     Past Surgical History:  Procedure Laterality Date   CARDIAC CATHETERIZATION      01/2010 ; 04/2010   CORONARY ARTERY BYPASS GRAFT N/A 08/23/2020   Procedure: CORONARY ARTERY BYPASS GRAFTING (CABG) x 3 ON CARDIOPULMONARY BYPASS USING EVH AND PARTIALLY OPEN HARVESTED LEFT GREATER SAPHENOUS VEIN AND LEFT INTERNAL MAMMARY ARTERY.  LIMA TO LAD, SVG TO OM, SVG TO PDA;  Surgeon: Lajuana Matte, MD;  Location: [place];  Service: Open Heart Surgery;  Laterality: N/A;   RIGHT/LEFT HEART CATH AND CORONARY ANGIOGRAPHY N/A 08/22/2020   Procedure: RIGHT/LEFT HEART CATH AND CORONARY ANGIOGRAPHY;  Surgeon: Nigel Mormon, MD;  Location: [place] [facility];  Service: Cardiovascular;  Laterality: N/A;   TEE WITHOUT CARDIOVERSION N/A 08/23/2020   Procedure: TRANSESOPHAGEAL ECHOCARDIOGRAM (TEE);  Surgeon: Lajuana Matte, MD;  Location: [place];  Service: Open Heart Surgery;  Laterality: N/A;   TEMPORARY PACEMAKER N/A 08/22/2020   Procedure: TEMPORARY PACEMAKER;  Surgeon: Nigel Mormon, MD;  Location: [place] [facility];  Service: Cardiovascular;  Laterality: N/A;   VEIN HARVEST Left 08/23/2020   Procedure: ENDOVEIN HARVEST AND PARTIALLY OPEN GREATER SAPHENOUS VEIN HARVESTING;  Surgeon: Lajuana Matte, MD;  Location: [place];  Service: Open Heart Surgery;  Laterality: Left;    Social History  reports that he quit smoking about 16 months ago. His smoking use included cigarettes. He has a 12.50 pack-year smoking history. He has never used smokeless tobacco. He reports that he does not drink alcohol and does not use drugs.  No Known Allergies  Family History  Problem Relation Age of Onset   Diabetes Neg Hx  Reviewed on admission  Prior to Admission medications   Medication Sig Start Date End Date Taking? Authorizing Provider  Accu-Chek Softclix Lancets lancets Use to check blood sugar three times daily. Patient not taking: Reported on 06/29/2021 10/14/20   Elgie Collard, PA-C  bisoprolol (ZEBETA) 5 MG tablet Take 1 tablet (5 mg total) by mouth daily. Patient not taking:  Reported on 06/29/2021 11/11/20   Nigel Mormon, MD  bisoprolol (ZEBETA) 5 MG tablet take 1 tablet by mouth daily Patient not taking: Reported on 06/29/2021 11/11/20   Nigel Mormon, MD  Blood Glucose Monitoring Suppl (ACCU-CHEK GUIDE) w/Device KIT USE AS DIRECTED 10/14/20   Elgie Collard, PA-C  clopidogrel (PLAVIX) 75 MG tablet Take 1 tablet (75 mg total) by mouth daily. Patient not taking: Reported on 06/29/2021 11/11/20   Nigel Mormon, MD  clopidogrel (PLAVIX) 75 MG tablet take 1 tablet by mouth daily Patient not taking: Reported on 06/29/2021 11/11/20   Nigel Mormon, MD  Continuous Blood Gluc Sensor (FREESTYLE LIBRE 2 SENSOR) MISC 1 Device by Does not apply route every 14 (fourteen) days. Patient not taking: Reported on 06/29/2021 02/22/21   Renato Shin, MD  empagliflozin (JARDIANCE) 25 MG TABS tablet Take 1 tablet (25 mg total) by mouth daily before breakfast. 11/15/21   Elayne Snare, MD  furosemide (LASIX) 40 MG tablet Take 1 tablet (40 mg total) by mouth daily. 11/15/21   Elayne Snare, MD  gabapentin (NEURONTIN) 100 MG capsule TAKE 1 CAPSULE (100 MG TOTAL) BY MOUTH 3 (THREE) TIMES DAILY. Patient not taking: Reported on 06/29/2021 11/02/20 11/02/21  Kerin Perna, NP  glucose blood (ACCU-CHEK GUIDE) test strip Use twice daily. And lancets 2/day 05/03/21   Renato Shin, MD  insulin isophane & regular human KwikPen (NOVOLIN 70/30 KWIKPEN) (70-30) 100 UNIT/ML KwikPen Inject 30 Units into the skin 2 (two) times daily before a meal. 11/15/21   Elayne Snare, MD  Insulin Pen Needle (TECHLITE PEN NEEDLES) 32G X 4 MM MISC Use as instructed to inject Lantus and Victoza once daily. 10/14/20   Elgie Collard, PA-C  ivabradine (CORLANOR) 5 MG TABS tablet Take 1 tablet (5 mg total) by mouth 2 (two) times daily with a meal. 08/31/20   Elgie Collard, PA-C  losartan (COZAAR) 100 MG tablet Take 1 tablet (100 mg total) by mouth daily. 11/15/21   Elayne Snare, MD  midodrine (PROAMATINE) 2.5 MG  tablet Take 1 tablet (2.5 mg total) by mouth 3 (three) times daily with meals. 08/31/20   Elgie Collard, PA-C  nitroGLYCERIN (NITROSTAT) 0.4 MG SL tablet Place 1 tablet (0.4 mg total) under the tongue every 5 (five) minutes as needed for chest pain. 11/11/20 02/09/21  Patwardhan, Reynold Bowen, MD  nitroGLYCERIN (NITROSTAT) 0.4 MG SL tablet Place 1 tab (0.52m total) under the tongue every 5 minutes as needed for chest pain 11/11/20   Patwardhan, Manish J, MD  oxyCODONE (OXY IR/ROXICODONE) 5 MG immediate release tablet Take 1 tablet (5 mg total) by mouth every 6 (six) hours as needed for severe pain. 08/31/20   CElgie Collard PA-C  pantoprazole (PROTONIX) 20 MG tablet Take 1 tablet (20 mg total) by mouth daily. 11/11/20   Patwardhan, MReynold Bowen MD  pantoprazole (PROTONIX) 20 MG tablet Take 1 tab by mouth daily 11/11/20   Patwardhan, Manish J, MD  rosuvastatin (CRESTOR) 40 MG tablet Take 1 tablet by mouth once nightly at bedtime. 08/10/21   KElayne Snare MD  spironolactone (ALDACTONE) 25  MG tablet Take 0.5 tablets (12.5 mg total) by mouth daily. 09/01/20   Elgie Collard, PA-C    Physical Exam: Vitals:   12/16/21 1445 12/16/21 1500 12/16/21 1530 12/16/21 1538  BP: (!) 159/66 (!) 145/63 (!) 157/64   Pulse: 94 91 91   Resp: (!) _0 Temp:      TempSrc:      SpO2: 98% 98% 96%   Weight:    60 kg  Height:    5' 1.42" (1.56 m)    Physical Exam Constitutional:      General: He is not in acute distress.    Appearance: Normal appearance.  HENT:     Head: Normocephalic and atraumatic.     Mouth/Throat:     Mouth: Mucous membranes are moist.     Pharynx: Oropharynx is clear.  Eyes:     Extraocular Movements: Extraocular movements intact.     Pupils: Pupils are equal, round, and reactive to light.  Cardiovascular:     Rate and Rhythm: Normal rate and regular rhythm.     Pulses: Normal pulses.     Heart sounds: Normal heart sounds.  Pulmonary:     Effort: Pulmonary effort is normal. No respiratory  distress.     Breath sounds: Wheezing and rales present.  Abdominal:     General: Bowel sounds are normal. There is no distension.     Palpations: Abdomen is soft.     Tenderness: There is no abdominal tenderness.  Musculoskeletal:        General: No swelling or deformity.     Right lower leg: Edema (Trace) present.     Left lower leg: Edema (trace) present.  Skin:    General: Skin is warm and dry.  Neurological:     General: No focal deficit present.     Mental Status: Mental status is at baseline.    Labs on Admission: I have personally reviewed following labs and imaging studies  CBC: Recent Labs  Lab 12/16/21 1257  WBC 11.1*  NEUTROABS 9.2*  HGB 13.7  HCT 41.8  MCV 92.3  PLT 250    Basic Metabolic Panel: Recent Labs  Lab 12/16/21 1257  NA 137  K 4.0  CL 105  CO2 22  GLUCOSE 369*  BUN 29*  CREATININE 2.06*  CALCIUM 8.3*    GFR: Estimated Creatinine Clearance: 24.8 mL/min (A) (by C-G formula based on SCr of 2.06 mg/dL (H)).  Liver Function Tests: No results for input(s): "AST", "ALT", "ALKPHOS", "BILITOT", "PROT", "ALBUMIN" in the last 168 hours.  Urine analysis:    Component Value Date/Time   COLORURINE YELLOW 12/16/2021 1327   APPEARANCEUR HAZY (A) 12/16/2021 1327   LABSPEC 1.032 (H) 12/16/2021 1327   PHURINE 5.0 12/16/2021 1327   GLUCOSEU >=500 (A) 12/16/2021 1327   HGBUR MODERATE (A) 12/16/2021 1327   BILIRUBINUR NEGATIVE 12/16/2021 1327   BILIRUBINUR negative 05/06/2020 1113   KETONESUR 20 (A) 12/16/2021 1327   PROTEINUR >=300 (A) 12/16/2021 1327   UROBILINOGEN 0.2 05/06/2020 1113   UROBILINOGEN 0.2 01/21/2010 1330   NITRITE NEGATIVE 12/16/2021 1327   LEUKOCYTESUR NEGATIVE 12/16/2021 1327    Radiological Exams on Admission: DG Chest Portable 1 View  Result Date: 12/16/2021 CLINICAL DATA:  Respiratory distress, chest pain EXAM: PORTABLE CHEST 1 VIEW COMPARISON:  10/14/2020 FINDINGS: Cardiomegaly status post median sternotomy. Mild,  diffuse bilateral interstitial pulmonary opacity. The visualized skeletal structures are unremarkable. IMPRESSION: Cardiomegaly with mild, diffuse bilateral interstitial pulmonary opacity, likely  edema. No focal airspace opacity. Electronically Signed   By: Delanna Ahmadi M.D.   On: 12/16/2021 14:26    EKG: Independently reviewed.  Sinus rhythm at 93 beats minute.  PVCs noted.  Baseline wander.  Nonspecific T wave flattening.  Assessment/Plan Principal Problem:   Acute on chronic systolic CHF (congestive heart failure) (HCC) Active Problems:   Atherosclerosis of native coronary artery of native heart with unstable angina pectoris (HCC)   Diabetes mellitus ([place])   Dyslipidemia   Essential hypertension   Coronary artery disease   S/P CABG x 3   Acute respiratory failure with hypoxia (HCC)   AKI (acute kidney injury) ([place])   Acute on chronic systolic CHF Acute hypoxic respiratory failure > Last echo was in 2022 with EF 30-35%, G2 DD, mildly reduced RV function. > Patient presenting with worsening shortness of breath last several days.  Extremity edema appears to be stable.  Does have a productive cough and some chest pain as well. > Lab work-up showed BNP elevated to greater than 2400 and troponin mildly elevated at 191 with repeat pending.  Creatinine elevated 2.06 from 1.4 and mild leukocytosis at 11.1.  Chest x-ray did show bilateral interstitial opacities consistent with edema. > [facility] cardiology was consulted and will see the patient likely tomorrow.  Recommend 4 mg IV Lasix twice daily - Monitor on telemetry unit - Continue with Lasix 40 mg IV  twice daily - Strict I's and O's, daily weights - Repeat echocardiogram - Holding off on home bisoprolol, ivabradine, losartan, spironolactone, midodrine until these can be can firm, most recent cardiology note which was from October of last year only mentions rosuvastatin and bisoprolol by name, and recent med rec note states he was not taking  this as of a couple months ago.  AKI > Creatinine elevated 2.06 and baseline 1.4 in the setting of CHF exacerbation as above. - Continue with diuresis - Trend renal function and electrolytes  Hyperlipidemia CAD status post CABG - Continue bisoprolol, losartan, Crestor once/if confirmed  Diabetes > On 70/30 insulin 30 units twice daily at home (confirmed on recent endocrinology notes) - 70/30 insulin 20 units twice daily - SSI  DVT prophylaxis: Lovenox Code Status:   Full Family Communication:  Unable to reach either of patient's sons by phone.  Disposition Plan:   Patient is from:  Home  Anticipated DC to:  Home  Anticipated DC date:  1 to 5 days  Anticipated DC barriers: None  Consults called:  Cardiology, [facility] cardiology Admission status:  Observation, telemetry  Severity of Illness: The appropriate patient status for this patient is OBSERVATION. Observation status is judged to be reasonable and necessary in order to provide the required intensity of service to ensure the patient's safety. The patient's presenting symptoms, physical exam findings, and initial radiographic and laboratory data in the context of their medical condition is felt to place them at decreased risk for further clinical deterioration. Furthermore, it is anticipated that the patient will be medically stable for discharge from the hospital within 2 midnights of admission.    Marcelyn Bruins MD [facility]  How to contact the [facility] Attending or Consulting provider [place] or covering provider during after hours [place], for this patient?   Check the care team in [facility] and look for a) attending/consulting [facility] provider listed and b) the [facility] team listed Log into [email or web address] and use 's universal password to access. If you do not have the password, please contact  the hospital operator. Locate the [facility] provider you are looking for under [facility] and page to a number that you can be  directly reached. If you still have difficulty reaching the provider, please page the [facility] (Director on Call) for the Hospitalists listed on amion for assistance.  12/16/2021, 4:46 PM

## 2021-12-16 NOTE — ED Triage Notes (Signed)
Patient with chest pain x2 days, SHOB, swelling in lower legs. Endorses painful urination as well

## 2021-12-16 NOTE — ED Provider Triage Note (Signed)
Emergency Medicine Provider Triage Evaluation Note  Colin [name] , a 71 y.o. male  was evaluated in triage.  Pt complains of left-sided chest pain, shortness of breath over the last 2 days.  Associated nonproductive cough.  Some swelling to his lower extremities.  Has had some chills without documented fever.  Also noted some difficulty urinating and some painful urination.  Review of Systems  Positive: CP, SOB, cough, dysuria Negative: fever  Physical Exam  There were no vitals taken for this visit. Gen:   Awake, no distress   Resp:  Normal effort  MSK:   Moves extremities without difficulty  Other:    Medical Decision Making  Medically screening exam initiated at 12:48 PM.  Appropriate orders placed.  Shamond Skelton Gauer was informed that the remainder of the evaluation will be completed by another provider, this initial triage assessment does not replace that evaluation, and the importance of remaining in the ED until their evaluation is complete.  CP, SOB   Layney Gillson A, PA-C 12/16/21 1249

## 2021-12-17 ENCOUNTER — Inpatient Hospital Stay (HOSPITAL_COMMUNITY): Payer: Medicare Other

## 2021-12-17 ENCOUNTER — Observation Stay (HOSPITAL_COMMUNITY): Payer: Medicare Other

## 2021-12-17 DIAGNOSIS — Z87891 Personal history of nicotine dependence: Secondary | ICD-10-CM | POA: Diagnosis not present

## 2021-12-17 DIAGNOSIS — I252 Old myocardial infarction: Secondary | ICD-10-CM | POA: Diagnosis not present

## 2021-12-17 DIAGNOSIS — N1831 Chronic kidney disease, stage 3a: Secondary | ICD-10-CM | POA: Diagnosis present

## 2021-12-17 DIAGNOSIS — N179 Acute kidney failure, unspecified: Secondary | ICD-10-CM | POA: Diagnosis present

## 2021-12-17 DIAGNOSIS — I5023 Acute on chronic systolic (congestive) heart failure: Secondary | ICD-10-CM | POA: Diagnosis present

## 2021-12-17 DIAGNOSIS — J441 Chronic obstructive pulmonary disease with (acute) exacerbation: Secondary | ICD-10-CM | POA: Diagnosis present

## 2021-12-17 DIAGNOSIS — Z7984 Long term (current) use of oral hypoglycemic drugs: Secondary | ICD-10-CM | POA: Diagnosis not present

## 2021-12-17 DIAGNOSIS — E8809 Other disorders of plasma-protein metabolism, not elsewhere classified: Secondary | ICD-10-CM | POA: Diagnosis present

## 2021-12-17 DIAGNOSIS — E1169 Type 2 diabetes mellitus with other specified complication: Secondary | ICD-10-CM | POA: Diagnosis present

## 2021-12-17 DIAGNOSIS — Z794 Long term (current) use of insulin: Secondary | ICD-10-CM | POA: Diagnosis not present

## 2021-12-17 DIAGNOSIS — Z1152 Encounter for screening for COVID-19: Secondary | ICD-10-CM | POA: Diagnosis not present

## 2021-12-17 DIAGNOSIS — E1165 Type 2 diabetes mellitus with hyperglycemia: Secondary | ICD-10-CM | POA: Diagnosis present

## 2021-12-17 DIAGNOSIS — I2510 Atherosclerotic heart disease of native coronary artery without angina pectoris: Secondary | ICD-10-CM | POA: Diagnosis not present

## 2021-12-17 DIAGNOSIS — E782 Mixed hyperlipidemia: Secondary | ICD-10-CM | POA: Diagnosis present

## 2021-12-17 DIAGNOSIS — J9601 Acute respiratory failure with hypoxia: Secondary | ICD-10-CM | POA: Diagnosis present

## 2021-12-17 DIAGNOSIS — Z955 Presence of coronary angioplasty implant and graft: Secondary | ICD-10-CM | POA: Diagnosis not present

## 2021-12-17 DIAGNOSIS — Z7902 Long term (current) use of antithrombotics/antiplatelets: Secondary | ICD-10-CM | POA: Diagnosis not present

## 2021-12-17 DIAGNOSIS — Z79899 Other long term (current) drug therapy: Secondary | ICD-10-CM | POA: Diagnosis not present

## 2021-12-17 DIAGNOSIS — I25110 Atherosclerotic heart disease of native coronary artery with unstable angina pectoris: Secondary | ICD-10-CM | POA: Diagnosis present

## 2021-12-17 DIAGNOSIS — Z951 Presence of aortocoronary bypass graft: Secondary | ICD-10-CM | POA: Diagnosis not present

## 2021-12-17 DIAGNOSIS — I130 Hypertensive heart and chronic kidney disease with heart failure and stage 1 through stage 4 chronic kidney disease, or unspecified chronic kidney disease: Secondary | ICD-10-CM | POA: Diagnosis present

## 2021-12-17 DIAGNOSIS — E1151 Type 2 diabetes mellitus with diabetic peripheral angiopathy without gangrene: Secondary | ICD-10-CM | POA: Diagnosis present

## 2021-12-17 DIAGNOSIS — E1122 Type 2 diabetes mellitus with diabetic chronic kidney disease: Secondary | ICD-10-CM | POA: Diagnosis present

## 2021-12-17 DIAGNOSIS — I255 Ischemic cardiomyopathy: Secondary | ICD-10-CM | POA: Diagnosis present

## 2021-12-17 DIAGNOSIS — I5021 Acute systolic (congestive) heart failure: Secondary | ICD-10-CM | POA: Diagnosis not present

## 2021-12-17 DIAGNOSIS — R0902 Hypoxemia: Secondary | ICD-10-CM | POA: Diagnosis present

## 2021-12-17 LAB — GLUCOSE, CAPILLARY
Glucose-Capillary: 227 mg/dL — ABNORMAL HIGH (ref 70–99)
Glucose-Capillary: 264 mg/dL — ABNORMAL HIGH (ref 70–99)
Glucose-Capillary: 113 mg/dL — ABNORMAL HIGH (ref 70–99)
Glucose-Capillary: 157 mg/dL — ABNORMAL HIGH (ref 70–99)

## 2021-12-17 LAB — ECHOCARDIOGRAM COMPLETE
Area-P 1/2: 4.89 cm2
Calc EF: 31.3 %
Height: 64 in
MV M vel: 4.97 m/s
MV Peak grad: 98.8 mmHg
Radius: 0.6 cm
S' Lateral: 4.3 cm
Single Plane A2C EF: 33.5 %
Single Plane A4C EF: 30.3 %
Weight: 2144 oz

## 2021-12-17 LAB — COMPREHENSIVE METABOLIC PANEL
ALT: 12 U/L (ref 0–44)
AST: 23 U/L (ref 15–41)
Albumin: <1.5 g/dL — ABNORMAL LOW (ref 3.5–5.0)
Alkaline Phosphatase: 63 U/L (ref 38–126)
Anion gap: 10 (ref 5–15)
BUN: 29 mg/dL — ABNORMAL HIGH (ref 8–23)
CO2: 24 mmol/L (ref 22–32)
Calcium: 8.1 mg/dL — ABNORMAL LOW (ref 8.9–10.3)
Chloride: 105 mmol/L (ref 98–111)
Creatinine, Ser: 2.21 mg/dL — ABNORMAL HIGH (ref 0.61–1.24)
GFR, Estimated: 31 mL/min — ABNORMAL LOW (ref >60)
Glucose, Bld: 236 mg/dL — ABNORMAL HIGH (ref 70–99)
Potassium: 3.3 mmol/L — ABNORMAL LOW (ref 3.5–5.1)
Sodium: 139 mmol/L (ref 135–145)
Total Bilirubin: 0.6 mg/dL (ref 0.3–1.2)
Total Protein: 5.3 g/dL — ABNORMAL LOW (ref 6.5–8.1)

## 2021-12-17 LAB — CBC
HCT: 38.8 % — ABNORMAL LOW (ref 39.0–52.0)
Hemoglobin: 13.6 g/dL (ref 13.0–17.0)
MCH: 31.6 pg (ref 26.0–34.0)
MCHC: 35.1 g/dL (ref 30.0–36.0)
MCV: 90 fL (ref 80.0–100.0)
Platelets: 214 10*3/uL (ref 150–400)
RBC: 4.31 MIL/uL (ref 4.22–5.81)
RDW: 12.3 % (ref 11.5–15.5)
WBC: 11.6 10*3/uL — ABNORMAL HIGH (ref 4.0–10.5)
nRBC: 0 % (ref 0.0–0.2)

## 2021-12-17 MED ORDER — IVABRADINE HCL 5 MG PO TABS
5.0000 mg | ORAL_TABLET | Freq: Two times a day (BID) | ORAL | Status: DC
  Administered 2021-12-17 – 2021-12-24 (×15): 5 mg via ORAL
  Filled 2021-12-17 (×16): qty 1

## 2021-12-17 MED ORDER — GUAIFENESIN ER 600 MG PO TB12
600.0000 mg | ORAL_TABLET | Freq: Two times a day (BID) | ORAL | Status: DC
  Administered 2021-12-17 – 2021-12-24 (×15): 600 mg via ORAL
  Filled 2021-12-17 (×15): qty 1

## 2021-12-17 MED ORDER — BISOPROLOL FUMARATE 5 MG PO TABS
5.0000 mg | ORAL_TABLET | Freq: Every day | ORAL | Status: DC
  Administered 2021-12-17 – 2021-12-22 (×6): 5 mg via ORAL
  Filled 2021-12-17 (×7): qty 1

## 2021-12-17 MED ORDER — POTASSIUM CHLORIDE CRYS ER 20 MEQ PO TBCR: 40.0000 meq | EXTENDED_RELEASE_TABLET | Freq: Once | ORAL | Status: DC

## 2021-12-17 MED ORDER — EMPAGLIFLOZIN 10 MG PO TABS
10.0000 mg | ORAL_TABLET | Freq: Every day | ORAL | Status: DC
  Administered 2021-12-17 – 2021-12-20 (×4): 10 mg via ORAL
  Filled 2021-12-17 (×4): qty 1

## 2021-12-17 MED ORDER — POTASSIUM CHLORIDE CRYS ER 20 MEQ PO TBCR
40.0000 meq | EXTENDED_RELEASE_TABLET | Freq: Two times a day (BID) | ORAL | Status: AC
  Administered 2021-12-17 (×2): 40 meq via ORAL
  Filled 2021-12-17 (×2): qty 2

## 2021-12-17 MED ORDER — INSULIN ASPART PROT & ASPART (70-30 MIX) 100 UNIT/ML ~~LOC~~ SUSP
25.0000 [IU] | Freq: Two times a day (BID) | SUBCUTANEOUS | Status: DC
  Administered 2021-12-17 – 2021-12-18 (×3): 25 [IU] via SUBCUTANEOUS
  Filled 2021-12-17: qty 10

## 2021-12-17 NOTE — Consult Note (Signed)
CARDIOLOGY CONSULT NOTE  Patient ID: BARLOW HARRISON MRN: [id number] DOB/AGE: Jun 10, 1950 71 y.o.  Admit date: 12/16/2021 Referring Physician  Dr Jomarie Longs Primary Physician:  Grayce Sessions, NP Reason for Consultation  CHF  Patient ID: HARPER SMOKER, male    DOB: 1950-06-02, 71 y.o.   MRN: [id number]  Chief Complaint  Patient presents with   Chest Pain   HPI:    NATHANYEL DEFENBAUGH  is a 71 y.o. male with past medical history significant for CAD s/p CABG x3 in July 2022, HFrEF 30-35%, and uncontrolled diabetes who presented to the ED with shortness of breath. He has been having shortness of breath with productive cough for a few days now. He did have some edema on admission, however, this morning patient does not appear volume overloaded. There is significant language barrier and no translator present so history is obtained mainly from chart review and staff. No events overnight. He does not appear to be in respiratory distress but cough is still present.   Past Medical History:  Diagnosis Date   Arrhythmia    CAD (coronary artery disease)    stent x 2   Carotid artery occlusion    Diabetes mellitus    Dyslipidemia    Essential hypertension    Past Surgical History:  Procedure Laterality Date   CARDIAC CATHETERIZATION     01/2010 ; 04/2010   CORONARY ARTERY BYPASS GRAFT N/A 08/23/2020   Procedure: CORONARY ARTERY BYPASS GRAFTING (CABG) x 3 ON CARDIOPULMONARY BYPASS USING EVH AND PARTIALLY OPEN HARVESTED LEFT GREATER SAPHENOUS VEIN AND LEFT INTERNAL MAMMARY ARTERY.  LIMA TO LAD, SVG TO OM, SVG TO PDA;  Surgeon: Corliss Skains, MD;  Location: [facility] OR;  Service: Open Heart Surgery;  Laterality: N/A;   RIGHT/LEFT HEART CATH AND CORONARY ANGIOGRAPHY N/A 08/22/2020   Procedure: RIGHT/LEFT HEART CATH AND CORONARY ANGIOGRAPHY;  Surgeon: [name], MD;  Location: [facility] INVASIVE CV LAB;  Service: Cardiovascular;  Laterality: N/A;   TEE WITHOUT CARDIOVERSION N/A 08/23/2020   Procedure:  TRANSESOPHAGEAL ECHOCARDIOGRAM (TEE);  Surgeon: Corliss Skains, MD;  Location: [facility] OR;  Service: Open Heart Surgery;  Laterality: N/A;   TEMPORARY PACEMAKER N/A 08/22/2020   Procedure: TEMPORARY PACEMAKER;  Surgeon: [name], MD;  Location: [facility] INVASIVE CV LAB;  Service: Cardiovascular;  Laterality: N/A;   VEIN HARVEST Left 08/23/2020   Procedure: ENDOVEIN HARVEST AND PARTIALLY OPEN GREATER SAPHENOUS VEIN HARVESTING;  Surgeon: Corliss Skains, MD;  Location: [facility] OR;  Service: Open Heart Surgery;  Laterality: Left;   Social History   Tobacco Use   Smoking status: Former    Packs/day: 0.25    Years: 50.00    Total pack years: 12.50    Types: Cigarettes    Quit date: 08/2020    Years since quitting: 1.3   Smokeless tobacco: Never  Substance Use Topics   Alcohol use: No    Family History  Problem Relation Age of Onset   Diabetes Neg Hx     Marital Status: Married  ROS  Review of Systems  Respiratory:  Positive for cough and shortness of breath.    Objective      12/17/2021    7:35 AM 12/17/2021    4:56 AM 12/17/2021    1:35 AM  Vitals with BMI  Weight   134 lbs  BMI   22.99  Systolic 129 123   Diastolic 56 43   Pulse 83 96     Blood pressure Marland Kitchen)  129/56, pulse 83, temperature 97.9 F (36.6 C), temperature source Oral, resp. rate 18, height 5\' 4"  (1.626 m), weight 60.8 kg, SpO2 94 %.    Physical Exam Vitals reviewed.  HENT:     Head: Normocephalic and atraumatic.  Cardiovascular:     Rate and Rhythm: Normal rate and regular rhythm.     Pulses: Normal pulses.     Heart sounds: Murmur heard.  Pulmonary:     Effort: Pulmonary effort is normal.     Breath sounds: Examination of the right-lower field reveals decreased breath sounds. Examination of the left-lower field reveals decreased breath sounds. Decreased breath sounds and wheezing present.  Abdominal:     General: Bowel sounds are normal.  Musculoskeletal:     Right lower leg: No edema.      Left lower leg: No edema.  Skin:    General: Skin is warm and dry.  Neurological:     Mental Status: He is alert.    Laboratory examination:   Recent Labs    11/08/21 1009 12/16/21 1257 12/17/21 0024  NA 143  143 137 139  K 4.0  4.0 4.0 3.3*  CL 108  108 105 105  CO2 31  31 22 24   GLUCOSE 72  72 369* 236*  BUN 19  19 29* 29*  CREATININE 1.51*  1.51* 2.06* 2.21*  CALCIUM 9.1  9.1 8.3* 8.1*  GFRNONAA  --  34* 31*   estimated creatinine clearance is 25.7 mL/min (A) (by C-G formula based on SCr of 2.21 mg/dL (H)).     Latest Ref Rng & Units 12/17/2021   12:24 AM 12/16/2021   12:57 PM 11/08/2021   10:09 AM  CMP  Glucose 70 - 99 mg/dL 13/12/2021  01/08/2022  72    72   BUN 8 - 23 mg/dL 29  29  19    19    Creatinine 0.61 - 1.24 mg/dL 485  462     7.03   Sodium 135 - 145 mmol/L 139  137  143    143   Potassium 3.5 - 5.1 mmol/L 3.3  4.0  4.0    4.0   Chloride 98 - 111 mmol/L 105  105  108    108   CO2 22 - 32 mmol/L 24  22  31    31    Calcium 8.9 - 10.3 mg/dL 8.1  8.3  9.1    9.1   Total Protein 6.5 - 8.1 g/dL 5.3   5.9   Total Bilirubin 0.3 - 1.2 mg/dL 0.6   0.3   Alkaline Phos 38 - 126 U/L 63   58   AST 15 - 41 U/L 23   19   ALT 0 - 44 U/L 12   12       Latest Ref Rng & Units 12/17/2021   12:24 AM 12/16/2021   12:57 PM 11/11/2020   11:36 AM  CBC  WBC 4.0 - 10.5 K/uL 11.6  11.1  6.1   Hemoglobin 13.0 - 17.0 g/dL  13/01/2022  13/12/2021   Hematocrit 39.0 - 52.0 % 38.8  41.8  41.1   Platelets 150 - 400 K/uL 214  230  213    Lipid Panel Recent Labs    11/08/21 1009  CHOL 305*  TRIG 84.0  LDLCALC 225*  VLDL 16.8  HDL 62.70  CHOLHDL 5    HEMOGLOBIN A1C Lab Results  Component Value Date   HGBA1C 11.4 (H) 11/08/2021  MPG 223.08 08/23/2020   TSH No results for input(s): "TSH" in the last 8760 hours. BNP (last 3 results) Recent Labs    12/16/21 1257  BNP 2,479.6*   Cardiac Panel (last 3 results) Recent Labs    12/16/21 1257 12/16/21 1450   TROPONINIHS 191* 228*     Medications and allergies  No Known Allergies   Current Meds  Medication Sig   empagliflozin (JARDIANCE) 25 MG TABS tablet Take 1 tablet (25 mg total) by mouth daily before breakfast.   gabapentin (NEURONTIN) 100 MG capsule TAKE 1 CAPSULE (100 MG TOTAL) BY MOUTH 3 (THREE) TIMES DAILY. (Patient taking differently: Take 100 mg by mouth 3 (three) times daily.)   ibuprofen (ADVIL) 100 MG/5ML suspension Take 200 mg by mouth every 4 (four) hours as needed for mild pain or moderate pain.   insulin isophane & regular human KwikPen (NOVOLIN 70/30 KWIKPEN) (70-30) 100 UNIT/ML KwikPen Inject 30 Units into the skin 2 (two) times daily before a meal. (Patient taking differently: Inject 40 Units into the skin daily.)   losartan (COZAAR) 100 MG tablet Take 1 tablet (100 mg total) by mouth daily.   nitroGLYCERIN (NITROSTAT) 0.4 MG SL tablet Place 1 tablet (0.4 mg total) under the tongue every 5 (five) minutes as needed for chest pain.   nitroGLYCERIN (NITROSTAT) 0.4 MG SL tablet Place 1 tab (0.4mg  total) under the tongue every 5 minutes as needed for chest pain   rosuvastatin (CRESTOR) 40 MG tablet Take 1 tablet by mouth once nightly at bedtime.    Scheduled Meds:  bisoprolol  5 mg Oral Daily   empagliflozin  10 mg Oral Daily   enoxaparin (LOVENOX) injection  30 mg Subcutaneous Q24H   furosemide  40 mg Intravenous BID   guaiFENesin  600 mg Oral BID   insulin aspart  0-9 Units Subcutaneous TID WC   insulin aspart protamine- aspart  25 Units Subcutaneous BID WC   ivabradine  5 mg Oral BID WC   potassium chloride  40 mEq Oral BID   rosuvastatin  40 mg Oral Daily   sodium chloride flush  3 mL Intravenous Q12H   Continuous Infusions: PRN Meds:.acetaminophen **OR** acetaminophen, polyethylene glycol   I/O last 3 completed shifts: In: 240 [P.O.:240] Out: 250 [Urine:250] Total I/O In: 240 [P.O.:240] Out: 250 [Urine:250]  Net IO Since Admission: -20 mL [12/17/21  1108]   Radiology:   Imaging results have been reviewed and US Abdomen Limited RUQ (LIVER/GB)  Result Date: 12/17/2021 CLINICAL DATA:  71 year old male with history of abnormal liver function tests. EXAM: ULTRASOUND ABDOMEN LIMITED RIGHT UPPER QUADRANT COMPARISON:  No priors. FINDINGS: Gallbladder: No gallstones. Gallbladder is nearly completely decompressed. Gallbladder wall measures slightly thickened (4 mm), likely secondary to underdistention of the bladder. No pericholecystic fluid. Per report from the sonographer, there was no sonographic Murphy's sign on examination. Common bile duct: Diameter: 2 mm Liver: No focal lesion identified. Within normal limits in parenchymal echogenicity. Portal vein is patent on color Doppler imaging with normal direction of blood flow towards the liver. Other: None. IMPRESSION: 1. No evidence to suggest biliary tract obstruction. 2. Gallbladder wall appears mildly thickened, likely secondary to under distension of the gallbladder. No gallstones or other definitive findings to suggest acute cholecystitis at this time. Electronically Signed   By: Trudie Reed M.D.   On: 12/17/2021 09:29   DG Chest Portable 1 View  Result Date: 12/16/2021 CLINICAL DATA:  Respiratory distress, chest pain EXAM: PORTABLE CHEST 1 VIEW COMPARISON:  10/14/2020 FINDINGS: Cardiomegaly status post median sternotomy. Mild, diffuse bilateral interstitial pulmonary opacity. The visualized skeletal structures are unremarkable. IMPRESSION: Cardiomegaly with mild, diffuse bilateral interstitial pulmonary opacity, likely edema. No focal airspace opacity. Electronically Signed   By: Jearld LeschAlex D Bibbey M.D.   On: 12/16/2021 14:26    Cardiac Studies:   Coronary angiography 08/22/2020: LM: Minimal disease LAD: Prox-mid long 80% stenosis Lcx: Lcx/OM1 95% ISR RCA: Prox 80% ISR, followed by In-stent CTO mid-distal vessel Left-to-right collaterals to distal vessel     Echocardiogram 08/20/2020:  1.  Left ventricular ejection fraction, by estimation, is 30 to 35%. The  left ventricle has moderate to severely decreased function. The left  ventricle demonstrates global hypokinesis. The left ventricular internal  cavity size was mildly dilated. Left  ventricular diastolic parameters are consistent with Grade II diastolic  dysfunction (pseudonormalization). Elevated left atrial pressure.   2. Right ventricular systolic function is mildly reduced. The right  ventricular size is normal.   3. Left atrial size was mildly dilated.   4. The mitral valve is degenerative. Moderate mitral valve regurgitation.  No evidence of mitral stenosis.   5. The aortic valve is tricuspid. Aortic valve regurgitation is not  visualized. Mild aortic valve sclerosis is present, with no evidence of  aortic valve stenosis.   6. The inferior vena cava is normal in size with greater than 50%  respiratory variability, suggesting right atrial pressure of 3 mmHg.   7. Evidence of atrial level shunting detected by color flow Doppler.     EKG: 12/16/2021: Sinus rhythm with PVCs, Anteroseptal infarct , age undetermined - unchanged from last year   EKG 08/29/2020: Sinus rhythm 80 bpm Anteroseptal and inferior infarct, age indeterminate   Assessment   HFrEF 30-35% w/grade II DD, mildly exacerbated  Hx of MI s/p CABG X 3 (LIMA LAD, RSVG PDA, OM1) on 08/23/2020  Acute hypoxic respiratory failure 2/2 above +/- infection  AKI on CKD, Cr slightly above baseline   Diabetes, uncontrolled  PAD  HTN  HLD   Recommendations:   Labs and imaging reviewed Restart home cardiac meds as able Lasix 40mg  IV BID given for 2 doses, IV discontinued Restart home Lasix today, ok to hold if Cr worsens Echocardiogram ordered, pending results No concern for cardiac ischemia at this time Supplemental o2 as needed Optimize electrolytes, K>4 and Mag>2 Maintain on telemetry, no invasive cardiac testing planned Follow-up with  Dr Rosemary HolmsPatwardhan as outpatient    Clotilde DieterSabina Joscelynn Brutus, DO, [facility] HospitalFACC 12/17/2021, 11:08 AM Office: [phone]

## 2021-12-17 NOTE — Care Management Obs Status (Signed)
MEDICARE OBSERVATION STATUS NOTIFICATION   Patient Details  Name: HANCE CASPERS MRN: [id number] Date of Birth: 01-19-1951   Medicare Observation Status Notification Given:  No (translator not present, unable to reach family)    Lawerance Sabal, RN 12/17/2021, 1:08 PM

## 2021-12-17 NOTE — Progress Notes (Signed)
Echocardiogram 2D Echocardiogram has been performed.  Warren Lacy Moselle Rister RDCS 12/17/2021, 2:55 PM

## 2021-12-17 NOTE — Progress Notes (Addendum)
PROGRESS NOTE    Colin [name]  UVO:[id number] DOB: [birth year] DOA: 12/16/2021 PCP: Grayce Sessions, NP   Colin [name] is a 71 y.o. male with medical history significant of CAD status post CABG, diabetes, hyperlipidemia, CHF with reduced ejection fraction presenting with chest pain and shortness of breath.  Patient has been experiencing 3 days of increasing shortness of breath with chronic edema also noting of chest pain and a productive cough.   in the ED, hypoxic to low 80s requiring 4L O2, lab w/ creat 2.2, albumin <1.5, WBC 11.1, BNP 2479, CXR w/ edema  Subjective: Still coughing, some dyspnea -no interpreter available for [place], we attempted to call pts family x3 to help translate without any success  Assessment and Plan:  Acute on chronic systolic CHF Acute hypoxic respiratory failure - Last [id number] with EF 30-35%, G2 DD, mildly reduced RV function, -w/ volume overload, AKi and hypoalbuminemia -continue IV lasix x1 day -FU Repeat echocardiogram -hold Cozaar, aldactone -restart Jardiance, ivabradine, bisoprolol -[facility] consulted on admission -has a productive cough w/ leukocytosis as well, ? Bronchitis -add cefdinir, mucinex   AKI -Creatinine elevated 2.06 and baseline 1.4 in the setting of CHF exacerbation as above. - Continue with diuresis - hold ARB, monitor   Hypoalbuminemia -etiology not clear, ? RV failure -check RUQ [place], denies ETOH use or HepC   Hyperlipidemia CAD status post CABG - Continue bisoprolol, plavix, Crestor   Diabetes > On 70/30 insulin 30 units twice daily at home  - increase insulin 70/30 dose - SSI  DVT prophylaxis:lovenox Code Status: Full Code Family Communication: none present Disposition Plan:   Consultants:    Procedures:   Antimicrobials:    Objective: Vitals:   12/16/21 1949 12/16/21 2337 12/17/21 0135 12/17/21 0456  BP: (!) 158/69 (!) 150/58  (!) 123/43  Pulse: 91 98  96  Resp: (!) 22 20  18    Temp: 99.7 F (37.6 C) 100.2 F (37.9 C)  100 F (37.8 C)  TempSrc: Oral Oral  Oral  SpO2: 95% 92%  (!) 89%  Weight: 63.6 kg  60.8 kg   Height: 5\' 4"  (1.626 m)       Intake/Output Summary (Last 24 hours) at 12/17/2021 0535 Last data filed at 12/17/2021 0122 Gross per 24 hour  Intake 240 ml  Output 250 ml  Net -10 ml   Filed Weights   12/16/21 1538 12/16/21 1949 12/17/21 0135  Weight: 60 kg 63.6 kg 60.8 kg    Examination:  Gen: Awake, Alert, pleasant male, laying in bed HEENT: no JVD Lungs: scattered ronchi CVS: S1S2/RRR Abd: soft, Non tender, non distended, BS present Extremities: No edema Skin: no new rashes on exposed skin    Data Reviewed:   CBC: Recent Labs  Lab 12/16/21 1257 12/17/21 0024  WBC 11.1* 11.6*  NEUTROABS 9.2*  --   HGB 13.7 13.6  HCT 41.8 38.8*  MCV 92.3 90.0  PLT 230 214   Basic Metabolic Panel: Recent Labs  Lab 12/16/21 1257 12/17/21 0024  NA 137 139  K 4.0 3.3*  CL 105 105  CO2 22 24  GLUCOSE 369* 236*  BUN 29* 29*  CREATININE 2.06* 2.21*  CALCIUM 8.3* 8.1*  MG 1.9  --    GFR: Estimated Creatinine Clearance: 25.7 mL/min (A) (by C-G formula based on SCr of 2.21 mg/dL (H)). Liver Function Tests: Recent Labs  Lab 12/17/21 0024  AST 23  ALT 12  ALKPHOS 63  BILITOT 0.6  PROT 5.3*  ALBUMIN <1.5*   No results for input(s): "LIPASE", "AMYLASE" in the last 168 hours. No results for input(s): "AMMONIA" in the last 168 hours. Coagulation Profile: No results for input(s): "INR", "PROTIME" in the last 168 hours. Cardiac Enzymes: No results for input(s): "CKTOTAL", "CKMB", "CKMBINDEX", "TROPONINI" in the last 168 hours. BNP (last 3 results) No results for input(s): "PROBNP" in the last 8760 hours. HbA1C: No results for input(s): "HGBA1C" in the last 72 hours. CBG: Recent Labs  Lab 12/16/21 1712 12/16/21 2044  GLUCAP 364* 209*   Lipid Profile: No results for input(s): "CHOL", "HDL", "LDLCALC", "TRIG", "CHOLHDL",  "LDLDIRECT" in the last 72 hours. Thyroid Function Tests: No results for input(s): "TSH", "T4TOTAL", "FREET4", "T3FREE", "THYROIDAB" in the last 72 hours. Anemia Panel: No results for input(s): "VITAMINB12", "FOLATE", "FERRITIN", "TIBC", "IRON", "RETICCTPCT" in the last 72 hours. Urine analysis:    Component Value Date/Time   COLORURINE YELLOW 12/16/2021 1327   APPEARANCEUR HAZY (A) 12/16/2021 1327   LABSPEC 1.032 (H) 12/16/2021 1327   PHURINE 5.0 12/16/2021 1327   GLUCOSEU >=500 (A) 12/16/2021 1327   HGBUR MODERATE (A) 12/16/2021 1327   BILIRUBINUR NEGATIVE 12/16/2021 1327   BILIRUBINUR negative 05/06/2020 1113   KETONESUR 20 (A) 12/16/2021 1327   PROTEINUR >=300 (A) 12/16/2021 1327   UROBILINOGEN 0.2 05/06/2020 1113   UROBILINOGEN 0.2 01/21/2010 1330   NITRITE NEGATIVE 12/16/2021 1327   LEUKOCYTESUR NEGATIVE 12/16/2021 1327   Sepsis Labs: @LABRCNTIP (procalcitonin:4,lacticidven:4)  ) Recent Results (from the past 240 hour(s))  Resp Panel by RT-PCR (Flu A&B, Covid) Anterior Nasal Swab     Status: None   Collection Time: 12/16/21 12:49 PM   Specimen: Anterior Nasal Swab  Result Value Ref Range Status   SARS Coronavirus 2 by RT PCR NEGATIVE NEGATIVE Final    Comment: (NOTE) SARS-CoV-2 target nucleic acids are NOT DETECTED.  The SARS-CoV-2 RNA is generally detectable in upper respiratory specimens during the acute phase of infection. The lowest concentration of SARS-CoV-2 viral copies this assay can detect is 138 copies/mL. A negative result does not preclude SARS-Cov-2 infection and should not be used as the sole basis for treatment or other patient management decisions. A negative result may occur with  improper specimen collection/handling, submission of specimen other than nasopharyngeal swab, presence of viral mutation(s) within the areas targeted by this assay, and inadequate number of viral copies(<138 copies/mL). A negative result must be combined with clinical  observations, patient history, and epidemiological information. The expected result is Negative.  Fact Sheet for Patients:  13/11/23  Fact Sheet for Healthcare Providers:  BloggerCourse.com  This test is no t yet approved or cleared by the [employer] FDA and  has been authorized for detection and/or diagnosis of SARS-CoV-2 by FDA under an Emergency Use Authorization (EUA). This EUA will remain  in effect (meaning this test can be used) for the duration of the COVID-19 declaration under Section 564(b)(1) of the Act, 21 U.S.C.section 360bbb-3(b)(1), unless the authorization is terminated  or revoked sooner.       Influenza A by PCR NEGATIVE NEGATIVE Final   Influenza B by PCR NEGATIVE NEGATIVE Final    Comment: (NOTE) The Xpert Xpress SARS-CoV-2/FLU/RSV plus assay is intended as an aid in the diagnosis of influenza from Nasopharyngeal swab specimens and should not be used as a sole basis for treatment. Nasal washings and aspirates are unacceptable for Xpert Xpress SARS-CoV-2/FLU/RSV testing.  Fact Sheet for Patients: [place]  Fact Sheet for Healthcare Providers: BloggerCourse.com  This test is not  yet approved or cleared by the [place] and has been authorized for detection and/or diagnosis of SARS-CoV-2 by FDA under an Emergency Use Authorization (EUA). This EUA will remain in effect (meaning this test can be used) for the duration of the COVID-19 declaration under Section 564(b)(1) of the Act, 21 U.S.C. section 360bbb-3(b)(1), unless the authorization is terminated or revoked.  Performed at [facility], [address], [place], [place] [address]      Radiology Studies: DG Chest Portable 1 View  Result Date: 12/16/2021 CLINICAL DATA:  Respiratory distress, chest pain EXAM: PORTABLE CHEST 1 VIEW COMPARISON:  10/14/2020 FINDINGS:  Cardiomegaly status post median sternotomy. Mild, diffuse bilateral interstitial pulmonary opacity. The visualized skeletal structures are unremarkable. IMPRESSION: Cardiomegaly with mild, diffuse bilateral interstitial pulmonary opacity, likely edema. No focal airspace opacity. Electronically Signed   By: Jearld Lesch M.D.   On: 12/16/2021 14:26     Scheduled Meds:  enoxaparin (LOVENOX) injection  30 mg Subcutaneous Q24H   furosemide  40 mg Intravenous BID   insulin aspart  0-9 Units Subcutaneous TID WC   insulin aspart protamine- aspart  20 Units Subcutaneous BID WC   potassium chloride  40 mEq Oral Once   rosuvastatin  40 mg Oral Daily   sodium chloride flush  3 mL Intravenous Q12H   Continuous Infusions:   LOS: 0 days    Time spent:    Zannie Cove, MD [facility]   12/17/2021, 5:35 AM

## 2021-12-18 DIAGNOSIS — N179 Acute kidney failure, unspecified: Secondary | ICD-10-CM | POA: Diagnosis not present

## 2021-12-18 DIAGNOSIS — I5023 Acute on chronic systolic (congestive) heart failure: Secondary | ICD-10-CM | POA: Diagnosis not present

## 2021-12-18 LAB — GLUCOSE, CAPILLARY
Glucose-Capillary: 93 mg/dL (ref 70–99)
Glucose-Capillary: 52 mg/dL — ABNORMAL LOW (ref 70–99)
Glucose-Capillary: 93 mg/dL (ref 70–99)
Glucose-Capillary: 146 mg/dL — ABNORMAL HIGH (ref 70–99)
Glucose-Capillary: 167 mg/dL — ABNORMAL HIGH (ref 70–99)

## 2021-12-18 LAB — COMPREHENSIVE METABOLIC PANEL
ALT: 17 U/L (ref 0–44)
AST: 36 U/L (ref 15–41)
Albumin: <1.5 g/dL — ABNORMAL LOW (ref 3.5–5.0)
Alkaline Phosphatase: 61 U/L (ref 38–126)
Anion gap: 13 (ref 5–15)
BUN: 40 mg/dL — ABNORMAL HIGH (ref 8–23)
CO2: 22 mmol/L (ref 22–32)
Calcium: 8.2 mg/dL — ABNORMAL LOW (ref 8.9–10.3)
Chloride: 105 mmol/L (ref 98–111)
Creatinine, Ser: 2.63 mg/dL — ABNORMAL HIGH (ref 0.61–1.24)
GFR, Estimated: 25 mL/min — ABNORMAL LOW (ref >60)
Glucose, Bld: 109 mg/dL — ABNORMAL HIGH (ref 70–99)
Potassium: 4.1 mmol/L (ref 3.5–5.1)
Sodium: 140 mmol/L (ref 135–145)
Total Bilirubin: 0.2 mg/dL — ABNORMAL LOW (ref 0.3–1.2)
Total Protein: 5.5 g/dL — ABNORMAL LOW (ref 6.5–8.1)

## 2021-12-18 LAB — CBC
HCT: 40.3 % (ref 39.0–52.0)
Hemoglobin: 13.5 g/dL (ref 13.0–17.0)
MCH: 30.5 pg (ref 26.0–34.0)
MCHC: 33.5 g/dL (ref 30.0–36.0)
MCV: 91 fL (ref 80.0–100.0)
Platelets: 165 10*3/uL (ref 150–400)
RBC: 4.43 MIL/uL (ref 4.22–5.81)
RDW: 12.6 % (ref 11.5–15.5)
WBC: 11.9 10*3/uL — ABNORMAL HIGH (ref 4.0–10.5)
nRBC: 0 % (ref 0.0–0.2)

## 2021-12-18 LAB — HEPATITIS C ANTIBODY: HCV Ab: NONREACTIVE

## 2021-12-18 MED ORDER — FUROSEMIDE 10 MG/ML IJ SOLN
40.0000 mg | Freq: Every day | INTRAMUSCULAR | Status: DC
  Administered 2021-12-18: 40 mg via INTRAVENOUS
  Filled 2021-12-18: qty 4

## 2021-12-18 MED ORDER — ISOSORB DINITRATE-HYDRALAZINE 20-37.5 MG PO TABS
0.5000 | ORAL_TABLET | Freq: Three times a day (TID) | ORAL | Status: DC
  Administered 2021-12-18 – 2021-12-24 (×18): 0.5 via ORAL
  Filled 2021-12-18 (×18): qty 1

## 2021-12-18 MED ORDER — INSULIN ASPART PROT & ASPART (70-30 MIX) 100 UNIT/ML ~~LOC~~ SUSP
15.0000 [IU] | Freq: Two times a day (BID) | SUBCUTANEOUS | Status: DC
  Administered 2021-12-19: 15 [IU] via SUBCUTANEOUS
  Filled 2021-12-18: qty 10

## 2021-12-18 NOTE — Progress Notes (Signed)
Repeat CBG 93 

## 2021-12-18 NOTE — Progress Notes (Signed)
Patient with hypoglycemic episode. CBG 52. Patient awake and alert, able to drink two cartons of OJ. Breakfast at the bedside, patient agreeable to eat. Will reassess.

## 2021-12-18 NOTE — Inpatient Diabetes Management (Signed)
Inpatient Diabetes Program Recommendations  AACE/ADA: New Consensus Statement on Inpatient Glycemic Control (2015)  Target Ranges:  Prepandial:   less than 140 mg/dL      Peak postprandial:   less than 180 mg/dL (1-2 hours)      Critically ill patients:  140 - 180 mg/dL   Lab Results  Component Value Date   GLUCAP 93 12/18/2021   HGBA1C 11.4 (H) 11/08/2021    Review of Glycemic Control  Diabetes history: DM 2  Outpatient Diabetes medications: 70/30 30 units bid Current orders for Inpatient glycemic control:  70/30 15 units bid Jardiance 10 mg Daily Novolog 0-9 units tid  A1c 11.4% on 10/14 Endocrinologist: Dr. Lucianne Muss, Last visit 10/11  Spoke with pt and family at bedside. Used son as interpreter to wife and patient. Son reports that pt is stubborn. Does not check glucose as he should. Pt takes medication as prescribed. Discussed importance of glucose control. Discussed A1c level. Stressed importance of taking medication consistently, checking glucose, and following up with Endocrinologist. Also stressed importance of no salt to diet in addition to watching carbohydrate intake.  While in the room patients monitor going off for oxygen level of 84%. Pt laying flat. Pt preferred to lay flat. I noticed O2 was off. I placed oxygen back on patient with improvement to oxygen levels up at 95%. Pet left resting in bed asleep with family at bedside.  Thanks,  Christena Deem RN, MSN, BC-ADM Inpatient Diabetes Coordinator Team Pager [phone] (8a-5p)

## 2021-12-18 NOTE — H&P (View-Only) (Signed)
Subjective:  Short of breath Feels tired  Objective:  Vital Signs in the last 24 hours: Temp:  [97.9 F (36.6 C)-98.6 F (37 C)] 97.9 F (36.6 C) (11/13 0531) Pulse Rate:  [67-83] 67 (11/13 0531) Resp:  [18-25] 25 (11/13 0531) BP: (119-129)/(56-87) 126/87 (11/13 0531) SpO2:  [92 %-96 %] 95 % (11/13 0531) Weight:  [61.7 kg] 61.7 kg (11/13 0500)  Intake/Output from previous day: 11/12 0701 - 11/13 0700 In: 540 [P.O.:540] Out: 675 [Urine:675]  Physical Exam Vitals and nursing note reviewed.  Constitutional:      General: He is not in acute distress. Neck:     Vascular: JVD present.  Cardiovascular:     Rate and Rhythm: Normal rate and regular rhythm.     Heart sounds: Normal heart sounds. No murmur heard. Pulmonary:     Effort: Accessory muscle usage present.     Breath sounds: Normal breath sounds. No wheezing or rales.  Musculoskeletal:     Right lower leg: Edema (1+) present.     Left lower leg: Edema (1+) present.      Imaging/tests reviewed and independently interpreted: CXR 12/16/2021: Cardiomegaly with mild, diffuse bilateral interstitial pulmonary opacity, likely edema. No focal airspace opacity.    Cardiac Studies: Telemetry 12/18/2021 No significant arhythmia  EKG 12/16/2021: Sinus rhythm with occasional Premature ventricular complexes Right axis deviation Anteroseptal infarct , age undetermined ST & T wave abnormality, consider inferolateral ischemia Abnormal ECG  Echocardiogram 12/17/2021:  1. Left ventricular ejection fraction, by estimation, is 30 to 35%. Left  ventricular ejection fraction by 2D MOD biplane is 31.3 %. The left  ventricle has moderately decreased function. The left ventricle  demonstrates global hypokinesis. Left  ventricular diastolic parameters are consistent with Grade III diastolic  dysfunction (restrictive). Elevated left ventricular end-diastolic  pressure.   2. Right ventricular systolic function is mildly reduced.  The right  ventricular size is normal. There is normal pulmonary artery systolic  pressure. The estimated right ventricular systolic pressure is 26.4 mmHg.   3. Left atrial size was moderately dilated.   4. The mitral valve is abnormal. Moderate to severe mitral valve  regurgitation.   5. The aortic valve is tricuspid. Aortic valve regurgitation is not  visualized. Aortic valve sclerosis/calcification is present, without any  evidence of aortic stenosis.   6. The inferior vena cava is normal in size with greater than 50%  respiratory variability, suggesting right atrial pressure of 3 mmHg.   7. Evidence of atrial level shunting detected by color flow Doppler.  There is a small patent foramen ovale with predominantly left to right  shunting across the atrial septum.   Comparison(s): Changes from prior study are noted. 08/23/2020: LVEF 20-30%.   Op note 08/23/2020 (Dr. Cliffton Asters): CABG X 3.  LIMA LAD, RSVG PDA, OM1     Coronary angiography 08/22/2020: LM: Minimal disease LAD: Prox-mid long 80% stenosis Lcx: Lcx/OM1 95% ISR RCA: Prox 80% ISR, followed by In-stent CTO mid-distal vessel Left-to-right collaterals to distal vessel Mildly decompensated ischemic cardiomyopathy Temp pacer placed through RIJ    Assessment & Recommendations:  71 year old [name] speaking patient with uncontrolled type 2 diabetes mellitus, mixed hyperlipidemia, tobacco dependence, coronary artery disease with prior MI, LCX and RCA stents in 2012, s/p CABG X 3.  LIMA LAD, RSVG PDA, OM1  (08/23/2020), admitted with acute on chronic systolic heart failure  Acute on chronic systolic heart failure: Fluid overloaded on exam. Admitted on 11/11. BNP 2479. Only -145 cc since admission, but I'm  unsure of the accuracy.  Currently on bisoprolol 5 mg daily, ivabradine 5 mg bid. Last dose of IV lasix was on 11/12 AM. Cr up from 1.5 on 10/4 to 2.0 on 11/11 to 2.63 on 11/13. Suspect cardiorenal syndrome in the setting of  fluid overload with acute on chronic systolic heart failure. I believe he requires more diuresis.  Resume Lasix IV 40 mg twice daily. Added BiDil 0.5 tablet 3 times daily for afterload reduction. Unable to use ARNI, MRA, SGLT2i due to renal dysfunction. He absolutely needs strict I/O monitoring. If there is no significant improvement in urine output and volume status by tomorrow, will consider right heart catheterization.  AKI: As above.  CAD:  S/o CABG No angina Trop elevation due to heart failure  [employer] interpreter was used.  Language barrier remains an issue, as his native language is [name]. I am concerned about his long-term prognosis.  With his poor insight, noncompliance, I am not sure he is a candidate for advanced heart failure therapy. I will try to talk to his family when they are in the hospital later today or tomorrow.  Discussed interpretation of tests and management recommendations with the primary team   [name], MD Pager: [phone] Office: [phone]

## 2021-12-18 NOTE — Progress Notes (Signed)
Heart Failure Navigator Progress Note  Assessed for Heart & Vascular TOC clinic readiness.  Patient does not meet criteria due to [facility] patient..     Kurstyn Larios, BSN, RN Heart Failure Nurse Navigator Secure Chat Only   

## 2021-12-18 NOTE — TOC Progression Note (Signed)
Transition of Care [facility]) - Progression Note    Patient Details  Name: Colin [name] MRN: [id number] Date of Birth: [birth year]  [facility] of [facility]) CM/SW Contact  Leone Haven, RN Phone Number: 12/18/2021, 9:02 PM  Clinical Narrative:    From home, CHF, conts on IV lasix, for possible right heart cath tomorrow. TOC following.         Expected Discharge Plan and Services                                                 Social Determinants of Health (SDOH) Interventions    Readmission Risk Interventions     No data to display

## 2021-12-18 NOTE — Progress Notes (Signed)
PROGRESS NOTE    Colin PasturesHuong M Spark  GNF:[id number] DOB: Feb 06, 1950 DOA: 12/16/2021 PCP: Grayce SessionsEdwards, Michelle P, NP   Colin [name] is a 71 y.o. male with medical history significant of CAD status post CABG, diabetes, hyperlipidemia, CHF with reduced ejection fraction presenting with chest pain and shortness of breath.  Patient has been experiencing 3 days of increasing shortness of breath with chronic edema also noting of chest pain and a productive cough.   in the ED, hypoxic to low 80s requiring 4L O2, lab w/ creat 2.2, albumin <1.5, WBC 11.1, BNP 2479, CXR w/ edema  Subjective: -Feels tired today, limited communication only with [place] interpreter, [place] interpreter unavailable  Assessment and Plan:  Acute on chronic systolic CHF Acute hypoxic respiratory failure - Last [id number] with EF 30-35%, G2 DD, mildly reduced RV function, -w/ volume overload, AKi and severe hypoalbuminemia -Repeat echo with EF of 30-35%, grade 3 diastolic dysfunction, mildly reduced RV function -Cardiology consulting, plan for repeat IV Lasix, bump in creatinine noted, may need right heart cath , hold Cozaar, aldactone -Continue Jardiance, ivabradine, bisoprolol -has a productive cough w/ leukocytosis as well, ? Bronchitis -add ceftriaxone, mucinex   AKI on CKD 3a -Creatinine elevated 2.06 and baseline 1.4 in the setting of CHF exacerbation as above. -Creatinine trended up to 2.6 today, cardiorenal syndrome, will check renal ultrasound, may need right heart cath - hold ARB, monitor   Hypoalbuminemia -etiology not clear, ? RV failure -RUQ US-did not indicate increased echogenicity of the liver, denies ETOH use or HepC, check hep C antibody   Hyperlipidemia CAD status post CABG - Continue bisoprolol, plavix, Crestor   Diabetes > On 70/30 insulin 30 units twice daily at home  - increase insulin 70/30 dose - SSI  DVT prophylaxis:lovenox Code Status: Full Code Family Communication: none present,  updated son yesterday Disposition Plan:   Consultants: [facility]   Procedures:   Antimicrobials:    Objective: Vitals:   12/17/21 2322 12/18/21 0500 12/18/21 0531 12/18/21 0745  BP: (!) 129/59  126/87 (!) 112/45  Pulse: 76  67 67  Resp: (!) 23  (!) 25 (!) 22  Temp: 98.2 F (36.8 C)  97.9 F (36.6 C) 97.9 F (36.6 C)  TempSrc: Oral  Oral Oral  SpO2: 92%  95% 98%  Weight:  61.7 kg    Height:        Intake/Output Summary (Last 24 hours) at 12/18/2021 1009 Last data filed at 12/18/2021 0930 Gross per 24 hour  Intake 360 ml  Output 425 ml  Net -65 ml   Filed Weights   12/16/21 1949 12/17/21 0135 12/18/21 0500  Weight: 63.6 kg 60.8 kg 61.7 kg    Examination:  Gen: Chronically ill male sitting up in bed, awake alert, oriented to place and time HEENT: Positive JVD CVS: S1-S2, regular rhythm Lungs: Scattered rhonchi bilaterally Abdomen: Soft, nontender, bowel sounds present Extremities: No edema Skin: no new rashes on exposed skin    Data Reviewed:   CBC: Recent Labs  Lab 12/16/21 1257 12/17/21 0024 12/18/21 0029  WBC 11.1* 11.6* 11.9*  NEUTROABS 9.2*  --   --   HGB 13.7 13.6 13.5  HCT 41.8 38.8* 40.3  MCV 92.3 90.0 91.0  PLT 230 214 165   Basic Metabolic Panel: Recent Labs  Lab 12/16/21 1257 12/17/21 0024 12/18/21 0029  NA 137 139 140  K 4.0 3.3* 4.1  CL 105 105 105  CO2 22 24 22   GLUCOSE 369* 236* 109*  BUN 29* 29* 40*  CREATININE 2.06* 2.21* 2.63*  CALCIUM 8.3* 8.1* 8.2*  MG 1.9  --   --    GFR: Estimated Creatinine Clearance: 21.6 mL/min (A) (by C-G formula based on SCr of 2.63 mg/dL (H)). Liver Function Tests: Recent Labs  Lab 12/17/21 0024 12/18/21 0029  AST 23 36  ALT 12 17  ALKPHOS 63 61  BILITOT 0.6 0.2*  PROT 5.3* 5.5*  ALBUMIN <1.5* <1.5*   No results for input(s): "LIPASE", "AMYLASE" in the last 168 hours. No results for input(s): "AMMONIA" in the last 168 hours. Coagulation Profile: No results for  input(s): "INR", "PROTIME" in the last 168 hours. Cardiac Enzymes: No results for input(s): "CKTOTAL", "CKMB", "CKMBINDEX", "TROPONINI" in the last 168 hours. BNP (last 3 results) No results for input(s): "PROBNP" in the last 8760 hours. HbA1C: No results for input(s): "HGBA1C" in the last 72 hours. CBG: Recent Labs  Lab 12/17/21 0619 12/17/21 1140 12/17/21 1547 12/17/21 2104 12/18/21 0646  GLUCAP 227* 264* 113* 157* 93   Lipid Profile: No results for input(s): "CHOL", "HDL", "LDLCALC", "TRIG", "CHOLHDL", "LDLDIRECT" in the last 72 hours. Thyroid Function Tests: No results for input(s): "TSH", "T4TOTAL", "FREET4", "T3FREE", "THYROIDAB" in the last 72 hours. Anemia Panel: No results for input(s): "VITAMINB12", "FOLATE", "FERRITIN", "TIBC", "IRON", "RETICCTPCT" in the last 72 hours. Urine analysis:    Component Value Date/Time   COLORURINE YELLOW 12/16/2021 1327   APPEARANCEUR HAZY (A) 12/16/2021 1327   LABSPEC 1.032 (H) 12/16/2021 1327   PHURINE 5.0 12/16/2021 1327   GLUCOSEU >=500 (A) 12/16/2021 1327   HGBUR MODERATE (A) 12/16/2021 1327   BILIRUBINUR NEGATIVE 12/16/2021 1327   BILIRUBINUR negative 05/06/2020 1113   KETONESUR 20 (A) 12/16/2021 1327   PROTEINUR >=300 (A) 12/16/2021 1327   UROBILINOGEN 0.2 05/06/2020 1113   UROBILINOGEN 0.2 01/21/2010 1330   NITRITE NEGATIVE 12/16/2021 1327   LEUKOCYTESUR NEGATIVE 12/16/2021 1327   Sepsis Labs: @LABRCNTIP (procalcitonin:4,lacticidven:4)  ) Recent Results (from the past 240 hour(s))  Resp Panel by RT-PCR (Flu A&B, Covid) Anterior Nasal Swab     Status: None   Collection Time: 12/16/21 12:49 PM   Specimen: Anterior Nasal Swab  Result Value Ref Range Status   SARS Coronavirus 2 by RT PCR NEGATIVE NEGATIVE Final    Comment: (NOTE) SARS-CoV-2 target nucleic acids are NOT DETECTED.  The SARS-CoV-2 RNA is generally detectable in upper respiratory specimens during the acute phase of infection. The lowest concentration of  SARS-CoV-2 viral copies this assay can detect is 138 copies/mL. A negative result does not preclude SARS-Cov-2 infection and should not be used as the sole basis for treatment or other patient management decisions. A negative result may occur with  improper specimen collection/handling, submission of specimen other than nasopharyngeal swab, presence of viral mutation(s) within the areas targeted by this assay, and inadequate number of viral copies(<138 copies/mL). A negative result must be combined with clinical observations, patient history, and epidemiological information. The expected result is Negative.  Fact Sheet for Patients:  13/11/23  Fact Sheet for Healthcare Providers:  BloggerCourse.com  This test is no t yet approved or cleared by the [place] FDA and  has been authorized for detection and/or diagnosis of SARS-CoV-2 by FDA under an Emergency Use Authorization (EUA). This EUA will remain  in effect (meaning this test can be used) for the duration of the COVID-19 declaration under Section 564(b)(1) of the Act, 21 U.S.C.section 360bbb-3(b)(1), unless the authorization is terminated  or revoked sooner.  Influenza A by PCR NEGATIVE NEGATIVE Final   Influenza B by PCR NEGATIVE NEGATIVE Final    Comment: (NOTE) The Xpert Xpress SARS-CoV-2/FLU/RSV plus assay is intended as an aid in the diagnosis of influenza from Nasopharyngeal swab specimens and should not be used as a sole basis for treatment. Nasal washings and aspirates are unacceptable for Xpert Xpress SARS-CoV-2/FLU/RSV testing.  Fact Sheet for Patients: BloggerCourse.com  Fact Sheet for Healthcare Providers: SeriousBroker.it  This test is not yet approved or cleared by the [place] FDA and has been authorized for detection and/or diagnosis of SARS-CoV-2 by FDA under an Emergency Use  Authorization (EUA). This EUA will remain in effect (meaning this test can be used) for the duration of the COVID-19 declaration under Section 564(b)(1) of the Act, 21 U.S.C. section 360bbb-3(b)(1), unless the authorization is terminated or revoked.  Performed at [facility], [address] [place], [place] [address]      Radiology Studies: ECHOCARDIOGRAM COMPLETE  Result Date: 12/17/2021    ECHOCARDIOGRAM REPORT   Patient Name:   JOVONTAE BANKO [facility] Date of Exam: 12/17/2021 Medical Rec #:  [id number]    Height:       64.0 in Accession #:    [id number]   Weight:       134.0 lb Date of Birth:  10/21/1950    BSA:          1.650 m Patient Age:    71 years     BP:           119/68 mmHg Patient Gender: M            HR:           74 bpm. Exam Location:  Inpatient Procedure: 2D Echo, Color Doppler and Cardiac Doppler Indications:    I50.21 Acute systolic (congestive) heart failure  History:        Patient has prior history of Echocardiogram examinations, most                 recent 08/23/2020. CHF, Prior CABG; Risk Factors:Hypertension,                 Diabetes and Dyslipidemia.  Sonographer:    Irving Burton Senior RDCS Referring Phys: [id number] Cecille Po MELVIN IMPRESSIONS  1. Left ventricular ejection fraction, by estimation, is 30 to 35%. Left ventricular ejection fraction by 2D MOD biplane is 31.3 %. The left ventricle has moderately decreased function. The left ventricle demonstrates global hypokinesis. Left ventricular diastolic parameters are consistent with Grade III diastolic dysfunction (restrictive). Elevated left ventricular end-diastolic pressure.  2. Right ventricular systolic function is mildly reduced. The right ventricular size is normal. There is normal pulmonary artery systolic pressure. The estimated right ventricular systolic pressure is 26.4 mmHg.  3. Left atrial size was moderately dilated.  4. The mitral valve is abnormal. Moderate to severe mitral valve regurgitation.  5. The aortic valve is  tricuspid. Aortic valve regurgitation is not visualized. Aortic valve sclerosis/calcification is present, without any evidence of aortic stenosis.  6. The inferior vena cava is normal in size with greater than 50% respiratory variability, suggesting right atrial pressure of 3 mmHg.  7. Evidence of atrial level shunting detected by color flow Doppler. There is a small patent foramen ovale with predominantly left to right shunting across the atrial septum. Comparison(s): Changes from prior study are noted. 08/23/2020: LVEF 20-30%. FINDINGS  Left Ventricle: Left ventricular ejection fraction, by estimation, is 30 to 35%. Left ventricular ejection  fraction by 2D MOD biplane is 31.3 %. The left ventricle has moderately decreased function. The left ventricle demonstrates global hypokinesis. The left ventricular internal cavity size was normal in size. There is no left ventricular hypertrophy. Left ventricular diastolic parameters are consistent with Grade III diastolic dysfunction (restrictive). Elevated left ventricular end-diastolic pressure. Right Ventricle: The right ventricular size is normal. No increase in right ventricular wall thickness. Right ventricular systolic function is mildly reduced. There is normal pulmonary artery systolic pressure. The tricuspid regurgitant velocity is 2.42 m/s, and with an assumed right atrial pressure of 3 mmHg, the estimated right ventricular systolic pressure is 26.4 mmHg. Left Atrium: Left atrial size was moderately dilated. Right Atrium: Right atrial size was normal in size. Pericardium: There is no evidence of pericardial effusion. Mitral Valve: The mitral valve is abnormal. There is mild thickening of the anterior and posterior mitral valve leaflet(s). There is mild calcification of the mitral valve leaflet(s). Moderate to severe mitral valve regurgitation, with posteriorly-directed jet. Tricuspid Valve: The tricuspid valve is grossly normal. Tricuspid valve regurgitation is  trivial. Aortic Valve: The aortic valve is tricuspid. Aortic valve regurgitation is not visualized. Aortic valve sclerosis/calcification is present, without any evidence of aortic stenosis. Pulmonic Valve: The pulmonic valve was grossly normal. Pulmonic valve regurgitation is trivial. Aorta: The aortic root and ascending aorta are structurally normal, with no evidence of dilitation. Venous: The inferior vena cava is normal in size with greater than 50% respiratory variability, suggesting right atrial pressure of 3 mmHg. IAS/Shunts: Evidence of atrial level shunting detected by color flow Doppler. A small patent foramen ovale is detected with predominantly left to right shunting across the atrial septum.  LEFT VENTRICLE PLAX 2D                        Biplane EF (MOD) LVIDd:         5.10 cm         LV Biplane EF:   Left LVIDs:         4.30 cm                          ventricular LV PW:         1.00 cm                          ejection LV IVS:        0.80 cm                          fraction by LVOT diam:     1.80 cm                          2D MOD LV SV:         40                               biplane is LV SV Index:   25                               31.3 %. LVOT Area:     2.54 cm  Diastology                                LV e' medial:    4.68 cm/s LV Volumes (MOD)               LV E/e' medial:  30.6 LV vol d, MOD    173.0 ml      LV e' lateral:   7.62 cm/s A2C:                           LV E/e' lateral: 18.8 LV vol d, MOD    120.0 ml A4C: LV vol s, MOD    115.0 ml A2C: LV vol s, MOD    83.6 ml A4C: LV SV MOD A2C:   58.0 ml LV SV MOD A4C:   120.0 ml LV SV MOD BP:    46.2 ml RIGHT VENTRICLE RV S prime:     8.92 cm/s TAPSE (M-mode): 1.5 cm LEFT ATRIUM             Index        RIGHT ATRIUM           Index LA diam:        4.60 cm 2.79 cm/m   RA Area:     12.20 cm LA Vol (A2C):   80.4 ml 48.72 ml/m  RA Volume:   27.50 ml  16.66 ml/m LA Vol (A4C):   63.0 ml 38.18 ml/m LA Biplane Vol:  70.8 ml 42.90 ml/m  AORTIC VALVE LVOT Vmax:   98.90 cm/s LVOT Vmean:  67.800 cm/s LVOT VTI:    0.159 m  AORTA Ao Root diam: 2.90 cm Ao Asc diam:  3.30 cm MITRAL VALVE                  TRICUSPID VALVE MV Area (PHT): 4.89 cm       TR Peak grad:   23.4 mmHg MV Decel Time: 155 msec       TR Vmax:        242.00 cm/s MR Peak grad:    98.8 mmHg MR Mean grad:    63.0 mmHg    SHUNTS MR Vmax:         497.00 cm/s  Systemic VTI:  0.16 m MR Vmean:        376.0 cm/s   Systemic Diam: 1.80 cm MR PISA:         2.26 cm MR PISA Eff ROA: 18 mm MR PISA Radius:  0.60 cm MV E velocity: 143.00 cm/s MV A velocity: 30.40 cm/s MV E/A ratio:  4.70 Zoila Shutter MD Electronically signed by Zoila Shutter MD Signature Date/Time: 12/17/2021/3:28:21 PM    Final    US Abdomen Limited RUQ (LIVER/GB)  Result Date: 12/17/2021 CLINICAL DATA:  71 year old male with history of abnormal liver function tests. EXAM: ULTRASOUND ABDOMEN LIMITED RIGHT UPPER QUADRANT COMPARISON:  No priors. FINDINGS: Gallbladder: No gallstones. Gallbladder is nearly completely decompressed. Gallbladder wall measures slightly thickened (4 mm), likely secondary to underdistention of the bladder. No pericholecystic fluid. Per report from the sonographer, there was no sonographic Murphy's sign on examination. Common bile duct: Diameter: 2 mm Liver: No focal lesion identified. Within normal limits in parenchymal echogenicity. Portal vein is patent on color Doppler imaging with normal direction of blood flow towards the liver. Other: None. IMPRESSION: 1. No evidence to suggest  biliary tract obstruction. 2. Gallbladder wall appears mildly thickened, likely secondary to under distension of the gallbladder. No gallstones or other definitive findings to suggest acute cholecystitis at this time. Electronically Signed   By: Trudie Reed M.D.   On: 12/17/2021 09:29   DG Chest Portable 1 View  Result Date: 12/16/2021 CLINICAL DATA:  Respiratory distress, chest pain EXAM:  PORTABLE CHEST 1 VIEW COMPARISON:  10/14/2020 FINDINGS: Cardiomegaly status post median sternotomy. Mild, diffuse bilateral interstitial pulmonary opacity. The visualized skeletal structures are unremarkable. IMPRESSION: Cardiomegaly with mild, diffuse bilateral interstitial pulmonary opacity, likely edema. No focal airspace opacity. Electronically Signed   By: Jearld Lesch M.D.   On: 12/16/2021 14:26     Scheduled Meds:  bisoprolol  5 mg Oral Daily   empagliflozin  10 mg Oral Daily   enoxaparin (LOVENOX) injection  30 mg Subcutaneous Q24H   furosemide  40 mg Intravenous Daily   guaiFENesin  600 mg Oral BID   insulin aspart  0-9 Units Subcutaneous TID WC   insulin aspart protamine- aspart  25 Units Subcutaneous BID WC   isosorbide-hydrALAZINE  0.5 tablet Oral TID   ivabradine  5 mg Oral BID WC   rosuvastatin  40 mg Oral Daily   sodium chloride flush  3 mL Intravenous Q12H   Continuous Infusions:   LOS: 1 day    Time spent:  Zannie Cove, MD [facility]   12/18/2021, 10:09 AM

## 2021-12-18 NOTE — Progress Notes (Signed)
Subjective:  Short of breath Feels tired  Objective:  Vital Signs in the last 24 hours: Temp:  [97.9 F (36.6 C)-98.6 F (37 C)] 97.9 F (36.6 C) (11/13 0531) Pulse Rate:  [67-83] 67 (11/13 0531) Resp:  [18-25] 25 (11/13 0531) BP: (119-129)/(56-87) 126/87 (11/13 0531) SpO2:  [92 %-96 %] 95 % (11/13 0531) Weight:  [61.7 kg] 61.7 kg (11/13 0500)  Intake/Output from previous day: 11/12 0701 - 11/13 0700 In: 540 [P.O.:540] Out: 675 [Urine:675]  Physical Exam Vitals and nursing note reviewed.  Constitutional:      General: He is not in acute distress. Neck:     Vascular: JVD present.  Cardiovascular:     Rate and Rhythm: Normal rate and regular rhythm.     Heart sounds: Normal heart sounds. No murmur heard. Pulmonary:     Effort: Accessory muscle usage present.     Breath sounds: Normal breath sounds. No wheezing or rales.  Musculoskeletal:     Right lower leg: Edema (1+) present.     Left lower leg: Edema (1+) present.      Imaging/tests reviewed and independently interpreted: CXR 12/16/2021: Cardiomegaly with mild, diffuse bilateral interstitial pulmonary opacity, likely edema. No focal airspace opacity.    Cardiac Studies: Telemetry 12/18/2021 No significant arhythmia  EKG 12/16/2021: Sinus rhythm with occasional Premature ventricular complexes Right axis deviation Anteroseptal infarct , age undetermined ST & T wave abnormality, consider inferolateral ischemia Abnormal ECG  Echocardiogram 12/17/2021:  1. Left ventricular ejection fraction, by estimation, is 30 to 35%. Left  ventricular ejection fraction by 2D MOD biplane is 31.3 %. The left  ventricle has moderately decreased function. The left ventricle  demonstrates global hypokinesis. Left  ventricular diastolic parameters are consistent with Grade III diastolic  dysfunction (restrictive). Elevated left ventricular end-diastolic  pressure.   2. Right ventricular systolic function is mildly reduced.  The right  ventricular size is normal. There is normal pulmonary artery systolic  pressure. The estimated right ventricular systolic pressure is 26.4 mmHg.   3. Left atrial size was moderately dilated.   4. The mitral valve is abnormal. Moderate to severe mitral valve  regurgitation.   5. The aortic valve is tricuspid. Aortic valve regurgitation is not  visualized. Aortic valve sclerosis/calcification is present, without any  evidence of aortic stenosis.   6. The inferior vena cava is normal in size with greater than 50%  respiratory variability, suggesting right atrial pressure of 3 mmHg.   7. Evidence of atrial level shunting detected by color flow Doppler.  There is a small patent foramen ovale with predominantly left to right  shunting across the atrial septum.   Comparison(s): Changes from prior study are noted. 08/23/2020: LVEF 20-30%.   Op note 08/23/2020 (Dr. Cliffton Asters): CABG X 3.  LIMA LAD, RSVG PDA, OM1     Coronary angiography 08/22/2020: LM: Minimal disease LAD: Prox-mid long 80% stenosis Lcx: Lcx/OM1 95% ISR RCA: Prox 80% ISR, followed by In-stent CTO mid-distal vessel Left-to-right collaterals to distal vessel Mildly decompensated ischemic cardiomyopathy Temp pacer placed through RIJ    Assessment & Recommendations:  71 year old [name] speaking patient with uncontrolled type 2 diabetes mellitus, mixed hyperlipidemia, tobacco dependence, coronary artery disease with prior MI, LCX and RCA stents in 2012, s/p CABG X 3.  LIMA LAD, RSVG PDA, OM1  (08/23/2020), admitted with acute on chronic systolic heart failure  Acute on chronic systolic heart failure: Fluid overloaded on exam. Admitted on 11/11. BNP 2479. Only -145 cc since admission, but I'm  unsure of the accuracy.  Currently on bisoprolol 5 mg daily, ivabradine 5 mg bid. Last dose of IV lasix was on 11/12 AM. Cr up from 1.5 on 10/4 to 2.0 on 11/11 to 2.63 on 11/13. Suspect cardiorenal syndrome in the setting of  fluid overload with acute on chronic systolic heart failure. I believe he requires more diuresis.  Resume Lasix IV 40 mg twice daily. Added BiDil 0.5 tablet 3 times daily for afterload reduction. Unable to use ARNI, MRA, SGLT2i due to renal dysfunction. He absolutely needs strict I/O monitoring. If there is no significant improvement in urine output and volume status by tomorrow, will consider right heart catheterization.  AKI: As above.  CAD:  S/o CABG No angina Trop elevation due to heart failure  V[employer]amins interpreter was used.  Language barrier remains an issue, as his native language is [name]. I am concerned about his long-term prognosis.  With his poor insight, noncompliance, I am not sure he is a candidate for advanced heart failure therapy. I will try to talk to his family when they are in the hospital later today or tomorrow.  Discussed interpretation of tests and management recommendations with the primary team   [name], MD Pager: [phone] Office: [phone]

## 2021-12-19 ENCOUNTER — Inpatient Hospital Stay (HOSPITAL_COMMUNITY): Payer: Medicare Other

## 2021-12-19 ENCOUNTER — Encounter (HOSPITAL_COMMUNITY): Payer: Self-pay | Admitting: Cardiology

## 2021-12-19 ENCOUNTER — Other Ambulatory Visit (HOSPITAL_COMMUNITY): Payer: Self-pay

## 2021-12-19 ENCOUNTER — Encounter (HOSPITAL_COMMUNITY)
Admission: EM | Disposition: A | Discharge status: Home Health | Payer: Self-pay | Source: Home / Self Care | Attending: Internal Medicine

## 2021-12-19 DIAGNOSIS — I5023 Acute on chronic systolic (congestive) heart failure: Secondary | ICD-10-CM | POA: Diagnosis not present

## 2021-12-19 HISTORY — PX: RIGHT HEART CATH: CATH118263

## 2021-12-19 LAB — POCT I-STAT EG7
Acid-base deficit: 4 mmol/L — ABNORMAL HIGH (ref 0.0–2.0)
Acid-base deficit: 4 mmol/L — ABNORMAL HIGH (ref 0.0–2.0)
Bicarbonate: 21.2 mmol/L (ref 20.0–28.0)
Bicarbonate: 21.9 mmol/L (ref 20.0–28.0)
Calcium, Ion: 1.14 mmol/L — ABNORMAL LOW (ref 1.15–1.40)
Calcium, Ion: 1.14 mmol/L — ABNORMAL LOW (ref 1.15–1.40)
HCT: 32 % — ABNORMAL LOW (ref 39.0–52.0)
HCT: 33 % — ABNORMAL LOW (ref 39.0–52.0)
Hemoglobin: 10.9 g/dL — ABNORMAL LOW (ref 13.0–17.0)
Hemoglobin: 11.2 g/dL — ABNORMAL LOW (ref 13.0–17.0)
O2 Saturation: 63 %
O2 Saturation: 64 %
Potassium: 4.5 mmol/L (ref 3.5–5.1)
Potassium: 4.5 mmol/L (ref 3.5–5.1)
Sodium: 138 mmol/L (ref 135–145)
Sodium: 138 mmol/L (ref 135–145)
TCO2: 22 mmol/L (ref 22–32)
TCO2: 23 mmol/L (ref 22–32)
pCO2, Ven: 39.9 mmHg — ABNORMAL LOW (ref 44–60)
pCO2, Ven: 41.2 mmHg — ABNORMAL LOW (ref 44–60)
pH, Ven: 7.334 (ref 7.25–7.43)
pH, Ven: 7.334 (ref 7.25–7.43)
pO2, Ven: 35 mmHg (ref 32–45)
pO2, Ven: 35 mmHg (ref 32–45)

## 2021-12-19 LAB — CBC
HCT: 37.8 % — ABNORMAL LOW (ref 39.0–52.0)
Hemoglobin: 12.6 g/dL — ABNORMAL LOW (ref 13.0–17.0)
MCH: 30.7 pg (ref 26.0–34.0)
MCHC: 33.3 g/dL (ref 30.0–36.0)
MCV: 92 fL (ref 80.0–100.0)
Platelets: 268 10*3/uL (ref 150–400)
RBC: 4.11 MIL/uL — ABNORMAL LOW (ref 4.22–5.81)
RDW: 13 % (ref 11.5–15.5)
WBC: 15.7 10*3/uL — ABNORMAL HIGH (ref 4.0–10.5)
nRBC: 0 % (ref 0.0–0.2)

## 2021-12-19 LAB — GLUCOSE, CAPILLARY
Glucose-Capillary: 98 mg/dL (ref 70–99)
Glucose-Capillary: 156 mg/dL — ABNORMAL HIGH (ref 70–99)
Glucose-Capillary: 135 mg/dL — ABNORMAL HIGH (ref 70–99)
Glucose-Capillary: 340 mg/dL — ABNORMAL HIGH (ref 70–99)
Glucose-Capillary: 348 mg/dL — ABNORMAL HIGH (ref 70–99)
Glucose-Capillary: 376 mg/dL — ABNORMAL HIGH (ref 70–99)

## 2021-12-19 LAB — BASIC METABOLIC PANEL
Anion gap: 9 (ref 5–15)
BUN: 49 mg/dL — ABNORMAL HIGH (ref 8–23)
CO2: 25 mmol/L (ref 22–32)
Calcium: 8.3 mg/dL — ABNORMAL LOW (ref 8.9–10.3)
Chloride: 106 mmol/L (ref 98–111)
Creatinine, Ser: 2.82 mg/dL — ABNORMAL HIGH (ref 0.61–1.24)
GFR, Estimated: 23 mL/min — ABNORMAL LOW (ref >60)
Glucose, Bld: 138 mg/dL — ABNORMAL HIGH (ref 70–99)
Potassium: 4.8 mmol/L (ref 3.5–5.1)
Sodium: 140 mmol/L (ref 135–145)

## 2021-12-19 LAB — LACTIC ACID, PLASMA
Lactic Acid, Venous: 2 mmol/L (ref 0.5–1.9)
Lactic Acid, Venous: 2 mmol/L (ref 0.5–1.9)

## 2021-12-19 SURGERY — RIGHT HEART CATH

## 2021-12-19 MED ORDER — HEPARIN (PORCINE) IN NACL 1000-0.9 UT/500ML-% IV SOLN
INTRAVENOUS | Status: DC | PRN
  Administered 2021-12-19: 500 mL

## 2021-12-19 MED ORDER — HYDRALAZINE HCL 20 MG/ML IJ SOLN: 10.0000 mg | INTRAMUSCULAR | Status: AC | PRN

## 2021-12-19 MED ORDER — SODIUM CHLORIDE 0.9 % IV SOLN: 250.0000 mL | INTRAVENOUS | Status: DC | PRN

## 2021-12-19 MED ORDER — BENZONATATE 100 MG PO CAPS
100.0000 mg | ORAL_CAPSULE | Freq: Three times a day (TID) | ORAL | Status: DC
  Administered 2021-12-19 – 2021-12-24 (×15): 100 mg via ORAL
  Filled 2021-12-19 (×15): qty 1

## 2021-12-19 MED ORDER — ONDANSETRON HCL 4 MG/2ML IJ SOLN: 4.0000 mg | Freq: Four times a day (QID) | INTRAMUSCULAR | Status: DC | PRN

## 2021-12-19 MED ORDER — IPRATROPIUM-ALBUTEROL 0.5-2.5 (3) MG/3ML IN SOLN
3.0000 mL | Freq: Four times a day (QID) | RESPIRATORY_TRACT | Status: DC
  Administered 2021-12-19 – 2021-12-20 (×7): 3 mL via RESPIRATORY_TRACT
  Filled 2021-12-19 (×7): qty 3

## 2021-12-19 MED ORDER — FUROSEMIDE 10 MG/ML IJ SOLN
20.0000 mg | Freq: Once | INTRAMUSCULAR | Status: AC
  Administered 2021-12-19: 20 mg via INTRAVENOUS
  Filled 2021-12-19: qty 2

## 2021-12-19 MED ORDER — SODIUM CHLORIDE 0.9% FLUSH: 3.0000 mL | INTRAVENOUS | Status: DC | PRN

## 2021-12-19 MED ORDER — ACETAMINOPHEN 325 MG PO TABS: 650.0000 mg | ORAL_TABLET | ORAL | Status: DC | PRN

## 2021-12-19 MED ORDER — SODIUM CHLORIDE 0.9% FLUSH
3.0000 mL | Freq: Two times a day (BID) | INTRAVENOUS | Status: DC
  Administered 2021-12-19 – 2021-12-24 (×7): 3 mL via INTRAVENOUS

## 2021-12-19 MED ORDER — METHYLPREDNISOLONE SODIUM SUCC 125 MG IJ SOLR
120.0000 mg | INTRAMUSCULAR | Status: DC
  Administered 2021-12-19: 120 mg via INTRAVENOUS
  Filled 2021-12-19: qty 2

## 2021-12-19 MED ORDER — LIDOCAINE HCL (PF) 1 % IJ SOLN
INTRAMUSCULAR | Status: DC | PRN
  Administered 2021-12-19: 2 mL via INTRADERMAL

## 2021-12-19 MED ORDER — LIDOCAINE HCL (PF) 1 % IJ SOLN
INTRAMUSCULAR | Status: AC
  Filled 2021-12-19: qty 30

## 2021-12-19 MED ORDER — ALUM & MAG HYDROXIDE-SIMETH 200-200-20 MG/5ML PO SUSP
15.0000 mL | Freq: Four times a day (QID) | ORAL | Status: DC | PRN
  Administered 2021-12-19: 15 mL via ORAL
  Filled 2021-12-19: qty 30

## 2021-12-19 MED ORDER — SODIUM CHLORIDE 0.9 % IV SOLN
1.0000 g | INTRAVENOUS | Status: DC
  Administered 2021-12-19 – 2021-12-21 (×3): 1 g via INTRAVENOUS
  Filled 2021-12-19 (×4): qty 10

## 2021-12-19 MED ORDER — ALBUTEROL SULFATE (2.5 MG/3ML) 0.083% IN NEBU
2.5000 mg | INHALATION_SOLUTION | RESPIRATORY_TRACT | Status: DC | PRN
  Administered 2021-12-20: 2.5 mg via RESPIRATORY_TRACT
  Filled 2021-12-19: qty 3

## 2021-12-19 MED ORDER — HEPARIN (PORCINE) IN NACL 1000-0.9 UT/500ML-% IV SOLN
INTRAVENOUS | Status: AC
  Filled 2021-12-19: qty 500

## 2021-12-19 MED ORDER — LABETALOL HCL 5 MG/ML IV SOLN: 10.0000 mg | INTRAVENOUS | Status: AC | PRN

## 2021-12-19 MED ORDER — INSULIN ASPART 100 UNIT/ML IJ SOLN
8.0000 [IU] | Freq: Once | INTRAMUSCULAR | Status: AC
  Administered 2021-12-19: 8 [IU] via SUBCUTANEOUS

## 2021-12-19 SURGICAL SUPPLY — 4 items
CATH BALLN WEDGE 5F 110CM (CATHETERS) IMPLANT
GUIDEWIRE .025 260CM (WIRE) IMPLANT
PACK CARDIAC CATHETERIZATION (CUSTOM PROCEDURE TRAY) IMPLANT
SHEATH GLIDE SLENDER 4/5FR (SHEATH) IMPLANT

## 2021-12-19 NOTE — Interval H&P Note (Signed)
History and Physical Interval Note:  12/19/2021 11:18 AM  Zyeir M Voorhis  has presented today for surgery, with the diagnosis of heart failure.  The various methods of treatment have been discussed with the patient and family. After consideration of risks, benefits and other options for treatment, the patient has consented to  Procedure(s): RIGHT HEART CATH (N/A) as a surgical intervention.  The patient's history has been reviewed, patient examined, no change in status, stable for surgery.  I have reviewed the patient's chart and labs.  Questions were answered to the patient's satisfaction.    2012 Appropriate Use Criteria for Diagnostic Catheterization Cardiomyopathies (Right and Left Heart Catheterization OR Right Heart Catheterization Alone With/Without Left Ventriculography and Coronary Angiography) Indication:  Known or suspected cardiomyopathy with or without heart failure A (7) Indication: 93; Score 7   Maddyson Keil J Camisha Srey

## 2021-12-19 NOTE — Progress Notes (Signed)
PROGRESS NOTE    [name]  ZOX:[id number] DOB: 01/18/1951 DOA: 12/16/2021 PCP: Grayce Sessions, NP   Colin [name] is a 71 y.o. male with medical history significant of CAD status post CABG, diabetes, hyperlipidemia, chronic systolic CHF, EF 81-19%, possible COPD, long-term smoker presented with chest pain and shortness of breath.   3 days of increasing shortness of breath with chronic edema,  chest pain and a productive cough.   in the ED, hypoxic to low 80s requiring 4L O2, lab w/ creat 2.2, albumin <1.5, WBC 11.1, BNP 2479, CXR w/ edema -Treated with IV diuretics initially, complicated by worsening AKI, also started on antibiotics and IV steroids for presumed COPD flare  Subjective: -Feels weak, coughing, short of breath -Reassessed with son at bedside  Assessment and Plan:  Acute on chronic systolic CHF Acute hypoxic respiratory failure -Multifactorial, suspect COPD/bronchitis along with CHF - Last [id number] with EF 30-35%, G2 DD, mildly reduced RV function, -w/ volume overload, AKi and severe hypoalbuminemia -Repeat echo with EF of 30-35%, grade 3 diastolic dysfunction, mildly reduced RV function -Cardiology consulting, diuresed with IV Lasix, complicated by worsening AKI, plan for right heart cath today, hold further diuretics till then, Cozaar and Aldactone on hold as well  -Continue Jardiance, ivabradine, bisoprolol -Also suspect a component of COPD exacerbation/bronchitis continue ceftriaxone will add IV steroids, continue DuoNebs, Mucinex    AKI on CKD 3a -Creatinine 2 on admission, from baseline of 1.4  -Creatinine trended up to 2.8 today, cardiorenal syndrome,  -Plan for right heart cath, hold further diuretics till then -Monitor urine output, BMP in a.m.  Hypoalbuminemia -etiology not clear, likely from RV failure -RUQ US-did not indicate increased echogenicity of the liver, denies ETOH use, hep C antibody negative -CMP in a.m.   Hyperlipidemia CAD status  post CABG - Continue bisoprolol, plavix, Crestor   Diabetes > On 70/30 insulin 30 units twice daily at home  - increase insulin 70/30 dose - SSI  DVT prophylaxis:lovenox Code Status: Full Code Family Communication: none present, updated son at bedside Disposition Plan: Home likely 3 to 4 days  Consultants: [facility] cardiology   Procedures:   Antimicrobials:    Objective: Vitals:   12/18/21 1949 12/19/21 0431 12/19/21 0724 12/19/21 0902  BP: (!) 120/54 (!) 143/47 (!) 148/53 (!) 139/53  Pulse: 60 72 63 65  Resp: (!) (!) 24  Temp: 98.3 F (36.8 C) 98.3 F (36.8 C) 98.1 F (36.7 C)   TempSrc: Oral Oral Oral   SpO2: 97% 94% 97% 97%  Weight:  63.3 kg    Height:        Intake/Output Summary (Last 24 hours) at 12/19/2021 1117 Last data filed at 12/19/2021 0826 Gross per 24 hour  Intake 25 ml  Output 525 ml  Net -500 ml   Filed Weights   12/17/21 0135 12/18/21 0500 12/19/21 0431  Weight: 60.8 kg 61.7 kg 63.3 kg    Examination:  Gen: Chronically ill male sitting up in bed, AAO x2 HEENT: No JVD CVS: S1-S2, regular rhythm Lungs: Scattered rhonchi bilaterally Abdomen: Soft, nontender, bowel sounds present Extremities: No edema Skin: no new rashes on exposed skin    Data Reviewed:   CBC: Recent Labs  Lab 12/16/21 1257 12/17/21 0024 12/18/21 0029 12/19/21 0619  WBC 11.1* 11.6* 11.9* 15.7*  NEUTROABS 9.2*  --   --   --   HGB 13.7 13.6 13.5 12.6*  HCT 41.8 38.8* 40.3 37.8*  MCV 92.3 90.0  91.0 92.0  PLT 230 214 165 268   Basic Metabolic Panel: Recent Labs  Lab 12/16/21 1257 12/17/21 0024 12/18/21 0029 12/19/21 0619  NA 137 139 140 140  K 4.0 3.3* 4.1 4.8  CL 105 105 105 106  CO2 22 24 22 25   GLUCOSE 369* 236* 109* 138*  BUN 29* 29* 40* 49*  CREATININE 2.06* 2.21* 2.63* 2.82*  CALCIUM 8.3* 8.1* 8.2* 8.3*  MG 1.9  --   --   --    GFR: Estimated Creatinine Clearance: 20.1 mL/min (A) (by C-G formula based on SCr of 2.82 mg/dL  (H)). Liver Function Tests: Recent Labs  Lab 12/17/21 0024 12/18/21 0029  AST 23 36  ALT 12 17  ALKPHOS 63 61  BILITOT 0.6 0.2*  PROT 5.3* 5.5*  ALBUMIN <1.5* <1.5*   No results for input(s): "LIPASE", "AMYLASE" in the last 168 hours. No results for input(s): "AMMONIA" in the last 168 hours. Coagulation Profile: No results for input(s): "INR", "PROTIME" in the last 168 hours. Cardiac Enzymes: No results for input(s): "CKTOTAL", "CKMB", "CKMBINDEX", "TROPONINI" in the last 168 hours. BNP (last 3 results) No results for input(s): "PROBNP" in the last 8760 hours. HbA1C: No results for input(s): "HGBA1C" in the last 72 hours. CBG: Recent Labs  Lab 12/18/21 1213 12/18/21 1548 12/18/21 2142 12/19/21 0603 12/19/21 1106  GLUCAP 93 146* 167* 98 156*   Lipid Profile: No results for input(s): "CHOL", "HDL", "LDLCALC", "TRIG", "CHOLHDL", "LDLDIRECT" in the last 72 hours. Thyroid Function Tests: No results for input(s): "TSH", "T4TOTAL", "FREET4", "T3FREE", "THYROIDAB" in the last 72 hours. Anemia Panel: No results for input(s): "VITAMINB12", "FOLATE", "FERRITIN", "TIBC", "IRON", "RETICCTPCT" in the last 72 hours. Urine analysis:    Component Value Date/Time   COLORURINE YELLOW 12/16/2021 1327   APPEARANCEUR HAZY (A) 12/16/2021 1327   LABSPEC 1.032 (H) 12/16/2021 1327   PHURINE 5.0 12/16/2021 1327   GLUCOSEU >=500 (A) 12/16/2021 1327   HGBUR MODERATE (A) 12/16/2021 1327   BILIRUBINUR NEGATIVE 12/16/2021 1327   BILIRUBINUR negative 05/06/2020 1113   KETONESUR 20 (A) 12/16/2021 1327   PROTEINUR >=300 (A) 12/16/2021 1327   UROBILINOGEN 0.2 05/06/2020 1113   UROBILINOGEN 0.2 01/21/2010 1330   NITRITE NEGATIVE 12/16/2021 1327   LEUKOCYTESUR NEGATIVE 12/16/2021 1327   Sepsis Labs: @LABRCNTIP (procalcitonin:4,lacticidven:4)  ) Recent Results (from the past 240 hour(s))  Resp Panel by RT-PCR (Flu A&B, Covid) Anterior Nasal Swab     Status: None   Collection Time: 12/16/21  12:49 PM   Specimen: Anterior Nasal Swab  Result Value Ref Range Status   SARS Coronavirus 2 by RT PCR NEGATIVE NEGATIVE Final    Comment: (NOTE) SARS-CoV-2 target nucleic acids are NOT DETECTED.  The SARS-CoV-2 RNA is generally detectable in upper respiratory specimens during the acute phase of infection. The lowest concentration of SARS-CoV-2 viral copies this assay can detect is 138 copies/mL. A negative result does not preclude SARS-Cov-2 infection and should not be used as the sole basis for treatment or other patient management decisions. A negative result may occur with  improper specimen collection/handling, submission of specimen other than nasopharyngeal swab, presence of viral mutation(s) within the areas targeted by this assay, and inadequate number of viral copies(<138 copies/mL). A negative result must be combined with clinical observations, patient history, and epidemiological information. The expected result is Negative.  Fact Sheet for Patients:  BloggerCourse.comhttps://www.fda.gov/media/152166/download  Fact Sheet for Healthcare Providers:  SeriousBroker.ithttps://www.fda.gov/media/152162/download  This test is no t yet approved or cleared by the [place] FDA and  has been authorized for detection and/or diagnosis of SARS-CoV-2 by FDA under an Emergency Use Authorization (EUA). This EUA will remain  in effect (meaning this test can be used) for the duration of the COVID-19 declaration under Section 564(b)(1) of the Act, 21 U.S.C.section 360bbb-3(b)(1), unless the authorization is terminated  or revoked sooner.       Influenza A by PCR NEGATIVE NEGATIVE Final   Influenza B by PCR NEGATIVE NEGATIVE Final    Comment: (NOTE) The Xpert Xpress SARS-CoV-2/FLU/RSV plus assay is intended as an aid in the diagnosis of influenza from Nasopharyngeal swab specimens and should not be used as a sole basis for treatment. Nasal washings and aspirates are unacceptable for Xpert Xpress  SARS-CoV-2/FLU/RSV testing.  Fact Sheet for Patients: BloggerCourse.com  Fact Sheet for Healthcare Providers: SeriousBroker.it  This test is not yet approved or cleared by the [place] FDA and has been authorized for detection and/or diagnosis of SARS-CoV-2 by FDA under an Emergency Use Authorization (EUA). This EUA will remain in effect (meaning this test can be used) for the duration of the COVID-19 declaration under Section 564(b)(1) of the Act, 21 U.S.C. section 360bbb-3(b)(1), unless the authorization is terminated or revoked.  Performed at [facility], [address] [place], [place] [address]      Radiology Studies: DG CHEST PORT 1 VIEW  Result Date: 12/19/2021 CLINICAL DATA:  Shortness of breath, hypoxia EXAM: PORTABLE CHEST 1 VIEW COMPARISON:  Previous studies including the examination of 12/16/2021 FINDINGS: Transverse diameter of heart is increased. There is previous coronary bypass surgery. Central pulmonary vessels are prominent. There is interval increase in interstitial markings in both parahilar regions and lower lung fields. There is no focal consolidation. There is no significant pleural effusion or pneumothorax. IMPRESSION: Cardiomegaly. There is interval increase in interstitial markings in both lungs suggesting possible worsening of interstitial pulmonary edema or interstitial pneumonia. Electronically Signed   By: Ernie Avena M.D.   On: 12/19/2021 08:57   ECHOCARDIOGRAM COMPLETE  Result Date: 12/17/2021    ECHOCARDIOGRAM REPORT   Patient Name:   Colin [name] [facility] Date of Exam: 12/17/2021 Medical Rec #:  [id number]    Height:       64.0 in Accession #:    [id number]   Weight:       134.0 lb Date of Birth:  08-30-1950    BSA:          1.650 m Patient Age:    71 years     BP:           119/68 mmHg Patient Gender: M            HR:           74 bpm. Exam Location:  Inpatient Procedure: 2D Echo, Color  Doppler and Cardiac Doppler Indications:    I50.21 Acute systolic (congestive) heart failure  History:        Patient has prior history of Echocardiogram examinations, most                 recent 08/23/2020. CHF, Prior CABG; Risk Factors:Hypertension,                 Diabetes and Dyslipidemia.  Sonographer:    Irving Burton Senior RDCS Referring Phys: [id number] Cecille Po MELVIN IMPRESSIONS  1. Left ventricular ejection fraction, by estimation, is 30 to 35%. Left ventricular ejection fraction by 2D MOD biplane is 31.3 %. The left ventricle has moderately decreased  function. The left ventricle demonstrates global hypokinesis. Left ventricular diastolic parameters are consistent with Grade III diastolic dysfunction (restrictive). Elevated left ventricular end-diastolic pressure.  2. Right ventricular systolic function is mildly reduced. The right ventricular size is normal. There is normal pulmonary artery systolic pressure. The estimated right ventricular systolic pressure is 26.4 mmHg.  3. Left atrial size was moderately dilated.  4. The mitral valve is abnormal. Moderate to severe mitral valve regurgitation.  5. The aortic valve is tricuspid. Aortic valve regurgitation is not visualized. Aortic valve sclerosis/calcification is present, without any evidence of aortic stenosis.  6. The inferior vena cava is normal in size with greater than 50% respiratory variability, suggesting right atrial pressure of 3 mmHg.  7. Evidence of atrial level shunting detected by color flow Doppler. There is a small patent foramen ovale with predominantly left to right shunting across the atrial septum. Comparison(s): Changes from prior study are noted. 08/23/2020: LVEF 20-30%. FINDINGS  Left Ventricle: Left ventricular ejection fraction, by estimation, is 30 to 35%. Left ventricular ejection fraction by 2D MOD biplane is 31.3 %. The left ventricle has moderately decreased function. The left ventricle demonstrates global hypokinesis. The left  ventricular internal cavity size was normal in size. There is no left ventricular hypertrophy. Left ventricular diastolic parameters are consistent with Grade III diastolic dysfunction (restrictive). Elevated left ventricular end-diastolic pressure. Right Ventricle: The right ventricular size is normal. No increase in right ventricular wall thickness. Right ventricular systolic function is mildly reduced. There is normal pulmonary artery systolic pressure. The tricuspid regurgitant velocity is 2.42 m/s, and with an assumed right atrial pressure of 3 mmHg, the estimated right ventricular systolic pressure is 26.4 mmHg. Left Atrium: Left atrial size was moderately dilated. Right Atrium: Right atrial size was normal in size. Pericardium: There is no evidence of pericardial effusion. Mitral Valve: The mitral valve is abnormal. There is mild thickening of the anterior and posterior mitral valve leaflet(s). There is mild calcification of the mitral valve leaflet(s). Moderate to severe mitral valve regurgitation, with posteriorly-directed jet. Tricuspid Valve: The tricuspid valve is grossly normal. Tricuspid valve regurgitation is trivial. Aortic Valve: The aortic valve is tricuspid. Aortic valve regurgitation is not visualized. Aortic valve sclerosis/calcification is present, without any evidence of aortic stenosis. Pulmonic Valve: The pulmonic valve was grossly normal. Pulmonic valve regurgitation is trivial. Aorta: The aortic root and ascending aorta are structurally normal, with no evidence of dilitation. Venous: The inferior vena cava is normal in size with greater than 50% respiratory variability, suggesting right atrial pressure of 3 mmHg. IAS/Shunts: Evidence of atrial level shunting detected by color flow Doppler. A small patent foramen ovale is detected with predominantly left to right shunting across the atrial septum.  LEFT VENTRICLE PLAX 2D                        Biplane EF (MOD) LVIDd:         5.10 cm          LV Biplane EF:   Left LVIDs:         4.30 cm                          ventricular LV PW:         1.00 cm                          ejection LV IVS:  0.80 cm                          fraction by LVOT diam:     1.80 cm                          2D MOD LV SV:         40                               biplane is LV SV Index:   25                               31.3 %. LVOT Area:     2.54 cm                                Diastology                                LV e' medial:    4.68 cm/s LV Volumes (MOD)               LV E/e' medial:  30.6 LV vol d, MOD    173.0 ml      LV e' lateral:   7.62 cm/s A2C:                           LV E/e' lateral: 18.8 LV vol d, MOD    120.0 ml A4C: LV vol s, MOD    115.0 ml A2C: LV vol s, MOD    83.6 ml A4C: LV SV MOD A2C:   58.0 ml LV SV MOD A4C:   120.0 ml LV SV MOD BP:    46.2 ml RIGHT VENTRICLE RV S prime:     8.92 cm/s TAPSE (M-mode): 1.5 cm LEFT ATRIUM             Index        RIGHT ATRIUM           Index LA diam:        4.60 cm 2.79 cm/m   RA Area:     12.20 cm LA Vol (A2C):   80.4 ml 48.72 ml/m  RA Volume:   27.50 ml  16.66 ml/m LA Vol (A4C):   63.0 ml 38.18 ml/m LA Biplane Vol: 70.8 ml 42.90 ml/m  AORTIC VALVE LVOT Vmax:   98.90 cm/s LVOT Vmean:  67.800 cm/s LVOT VTI:    0.159 m  AORTA Ao Root diam: 2.90 cm Ao Asc diam:  3.30 cm MITRAL VALVE                  TRICUSPID VALVE MV Area (PHT): 4.89 cm       TR Peak grad:   23.4 mmHg MV Decel Time: 155 msec       TR Vmax:        242.00 cm/s MR Peak grad:    98.8 mmHg MR Mean grad:    63.0 mmHg    SHUNTS MR Vmax:         497.00 cm/s  Systemic VTI:  0.16 m MR Vmean:  376.0 cm/s   Systemic Diam: 1.80 cm MR PISA:         2.26 cm MR PISA Eff ROA: 18 mm MR PISA Radius:  0.60 cm MV E velocity: 143.00 cm/s MV A velocity: 30.40 cm/s MV E/A ratio:  4.70 Zoila Shutter MD Electronically signed by Zoila Shutter MD Signature Date/Time: 12/17/2021/3:28:21 PM    Final      Scheduled Meds:  [MAR Hold] bisoprolol  5 mg Oral Daily    [MAR Hold] empagliflozin  10 mg Oral Daily   [MAR Hold] enoxaparin (LOVENOX) injection  30 mg Subcutaneous Q24H   [MAR Hold] guaiFENesin  600 mg Oral BID   [MAR Hold] insulin aspart  0-9 Units Subcutaneous TID WC   [MAR Hold] insulin aspart protamine- aspart  15 Units Subcutaneous BID WC   [MAR Hold] ipratropium-albuterol  3 mL Nebulization QID   [MAR Hold] isosorbide-hydrALAZINE  0.5 tablet Oral TID   [MAR Hold] ivabradine  5 mg Oral BID WC   [MAR Hold] methylPREDNISolone (SOLU-MEDROL) injection  120 mg Intravenous Q24H   [MAR Hold] rosuvastatin  40 mg Oral Daily   [MAR Hold] sodium chloride flush  3 mL Intravenous Q12H   Continuous Infusions:  [MAR Hold] cefTRIAXone (ROCEPHIN)  IV 1 g (12/19/21 0902)     LOS: 2 days    Time spent:  Zannie Cove, MD [facility]   12/19/2021, 11:17 AM

## 2021-12-19 NOTE — TOC Benefit Eligibility Note (Signed)
Patient Product/process development scientist completed.    The patient is currently admitted and upon discharge could be taking Corlanor 5 mg.  The current 30 day co-pay is $0.00.   The patient is currently admitted and upon discharge could be taking isosorbide-hydralyzine (Bidil) 20-37.5 mg.  The current 30 day co-pay is $0.00.   The patient is currently admitted and upon discharge could be taking Jardiance 25 mg.  The current 30 day co-pay is $0.00.   The patient is currently admitted and upon discharge could be taking bisoprolol 5 mg.  The current 30 day co-pay is $0.00.   The patient is insured through [employer]     Roland Earl, CPhT Pharmacy Patient Advocate Specialist [facility] Patient Advocate Team Direct Number: [phone]  Fax: [phone]

## 2021-12-20 ENCOUNTER — Inpatient Hospital Stay (HOSPITAL_COMMUNITY): Payer: Medicare Other

## 2021-12-20 DIAGNOSIS — I5023 Acute on chronic systolic (congestive) heart failure: Secondary | ICD-10-CM | POA: Diagnosis not present

## 2021-12-20 DIAGNOSIS — N179 Acute kidney failure, unspecified: Secondary | ICD-10-CM | POA: Diagnosis not present

## 2021-12-20 LAB — BASIC METABOLIC PANEL
Anion gap: 11 (ref 5–15)
BUN: 65 mg/dL — ABNORMAL HIGH (ref 8–23)
CO2: 24 mmol/L (ref 22–32)
Calcium: 8 mg/dL — ABNORMAL LOW (ref 8.9–10.3)
Chloride: 103 mmol/L (ref 98–111)
Creatinine, Ser: 3.1 mg/dL — ABNORMAL HIGH (ref 0.61–1.24)
GFR, Estimated: 21 mL/min — ABNORMAL LOW (ref >60)
Glucose, Bld: 340 mg/dL — ABNORMAL HIGH (ref 70–99)
Potassium: 4.7 mmol/L (ref 3.5–5.1)
Sodium: 138 mmol/L (ref 135–145)

## 2021-12-20 LAB — GLUCOSE, CAPILLARY
Glucose-Capillary: 317 mg/dL — ABNORMAL HIGH (ref 70–99)
Glucose-Capillary: 422 mg/dL — ABNORMAL HIGH (ref 70–99)
Glucose-Capillary: 396 mg/dL — ABNORMAL HIGH (ref 70–99)
Glucose-Capillary: 315 mg/dL — ABNORMAL HIGH (ref 70–99)
Glucose-Capillary: 204 mg/dL — ABNORMAL HIGH (ref 70–99)

## 2021-12-20 MED ORDER — INSULIN ASPART PROT & ASPART (70-30 MIX) 100 UNIT/ML ~~LOC~~ SUSP
30.0000 [IU] | Freq: Two times a day (BID) | SUBCUTANEOUS | Status: DC
  Administered 2021-12-20 – 2021-12-24 (×5): 30 [IU] via SUBCUTANEOUS
  Filled 2021-12-20: qty 10

## 2021-12-20 MED ORDER — ROSUVASTATIN CALCIUM 5 MG PO TABS
5.0000 mg | ORAL_TABLET | Freq: Every day | ORAL | Status: DC
  Administered 2021-12-21 – 2021-12-24 (×4): 5 mg via ORAL
  Filled 2021-12-20 (×5): qty 1

## 2021-12-20 MED ORDER — INSULIN ASPART 100 UNIT/ML IJ SOLN
3.0000 [IU] | Freq: Three times a day (TID) | INTRAMUSCULAR | Status: DC
  Administered 2021-12-20 – 2021-12-24 (×9): 3 [IU] via SUBCUTANEOUS

## 2021-12-20 MED ORDER — METHYLPREDNISOLONE SODIUM SUCC 125 MG IJ SOLR
80.0000 mg | INTRAMUSCULAR | Status: DC
  Administered 2021-12-20 – 2021-12-21 (×2): 80 mg via INTRAVENOUS
  Filled 2021-12-20 (×3): qty 2

## 2021-12-20 MED ORDER — NICOTINE 14 MG/24HR TD PT24
14.0000 mg | MEDICATED_PATCH | Freq: Every day | TRANSDERMAL | Status: DC
  Administered 2021-12-20 – 2021-12-24 (×5): 14 mg via TRANSDERMAL
  Filled 2021-12-20 (×5): qty 1

## 2021-12-20 MED ORDER — SODIUM CHLORIDE 0.9 % IV BOLUS
500.0000 mL | Freq: Once | INTRAVENOUS | Status: AC
  Administered 2021-12-20: 500 mL via INTRAVENOUS

## 2021-12-20 NOTE — Progress Notes (Signed)
PROGRESS NOTE    CAP MASSI  YSA:[id number] DOB: [birth year] DOA: 12/16/2021 PCP: Grayce Sessions, NP   Colin [name] is a 71 y.o. male with medical history significant of CAD status post CABG, diabetes, hyperlipidemia, chronic systolic CHF, EF 32-35%, possible COPD, long-term smoker presented with chest pain and shortness of breath.   3 days of increasing shortness of breath with chronic edema,  chest pain and a productive cough.   in the ED, hypoxic to low 80s requiring 4L O2, lab w/ creat 2.2, albumin <1.5, WBC 11.1, BNP 2479, CXR w/ edema -Treated with IV diuretics initially, complicated by worsening AKI, also started on antibiotics and IV steroids for presumed COPD flare -Right heart cath 11/14 with normal filling pressures  Subjective: -Feels weak, still coughing, breathing starting to improve  Assessment and Plan:  Acute on chronic systolic CHF Acute hypoxic respiratory failure -Multifactorial, suspect COPD/bronchitis along with CHF - Last [id number] with EF 30-35%, G2 DD, mildly reduced RV function, -w/ volume overload, AKi and severe hypoalbuminemia -Repeat echo with EF of 30-35%, grade 3 diastolic dysfunction, mildly reduced RV function -Cardiology consulting, diuresed with IV Lasix, complicated by worsening AKI, right heart cath with normal filling pressures, wedge of 12 -Diuretics, Cozaar, Aldactone on hold, will give 500 mL fluid back in light of worsening kidney function -Continue  ivabradine, bisoprolol, hold Jardiance  COPD exacerbation, bronchitis -Continue IV Solu-Medrol, ceftriaxone -Continue DuoNebs and Mucinex -Incentive spirometry, increase activity today   AKI on CKD 3a -Creatinine 2 on admission, from baseline of 1.4  -Creatinine trended up to 3.1 today  -Likely cardiorenal, right heart cath with normal filling pressures, diuretics on hold -Also check renal ultrasound, gentle IVF X 500 mL today -Monitor urine output, BMP in a.m. -Will need nephrology  input if kidney function continues to worsen  Hypoalbuminemia -etiology not clear, likely from RV failure -RUQ US-did not indicate increased echogenicity of the liver, denies ETOH use, hep C antibody negative -CMP in a.m.   Hyperlipidemia CAD status post CABG - Continue bisoprolol, plavix, Crestor   Diabetes > On 70/30 insulin 30 units twice daily at home  - increase insulin 70/30 dose, CBGs higher in the setting of steroids - SSI  DVT prophylaxis:lovenox Code Status: Full Code Family Communication: none present, updated son at bedside yesterday Disposition Plan: Home likely 48 hours  Consultants: [facility] cardiology   Procedures:   Antimicrobials:    Objective: Vitals:   12/20/21 0800 12/20/21 0858 12/20/21 0900 12/20/21 1000  BP: (!) 106/44 (!) 143/52 (!) 143/52 (!) 134/45  Pulse: (!) 56 61 (!) 59 60  Resp:  (!) 31 (!) 29   Temp:      TempSrc:      SpO2: (!) 89% 94% 98% 91%  Weight:      Height:        Intake/Output Summary (Last 24 hours) at 12/20/2021 1028 Last data filed at 12/20/2021 0836 Gross per 24 hour  Intake 220 ml  Output 850 ml  Net -630 ml   Filed Weights   12/18/21 0500 12/19/21 0431 12/20/21 0343  Weight: 61.7 kg 63.3 kg 62.9 kg    Examination:  Gen: Chronically ill male sitting up in bed, AAOx3, looks better compared to yesterday HEENT: No JVD CVS: S1-S2, regular rhythm Lungs: Improving air movement, occasional rhonchi Abdomen: Soft, nontender, bowel sounds present Extremities: No edema  Skin: no new rashes on exposed skin    Data Reviewed:   CBC: Recent Labs  Lab 12/16/21  1257 12/17/21 0024 12/18/21 0029 12/19/21 0619 12/19/21 1209  WBC 11.1* 11.6* 11.9* 15.7*  --   NEUTROABS 9.2*  --   --   --   --   HGB 13.7 13.6 13.5 12.6* 10.9*  11.2*  HCT 41.8 38.8* 40.3 37.8* 32.0*  33.0*  MCV 92.3 90.0 91.0 92.0  --   PLT 230 214 165 268  --    Basic Metabolic Panel: Recent Labs  Lab 12/16/21 1257 12/17/21 0024  12/18/21 0029 12/19/21 0619 12/19/21 1209 12/20/21 0059  NA 137 139 140 140 138  138 138  K 4.0 3.3* 4.1 4.8 4.5  4.5 4.7  CL 105 105 105 106  --  103  CO2 22 24 22 25   --  24  GLUCOSE 369* 236* 109* 138*  --  340*  BUN 29* 29* 40* 49*  --  65*  CREATININE 2.06* 2.21* 2.63* 2.82*  --  3.10*  CALCIUM 8.3* 8.1* 8.2* 8.3*  --  8.0*  MG 1.9  --   --   --   --   --    GFR: Estimated Creatinine Clearance: 18.3 mL/min (A) (by C-G formula based on SCr of 3.1 mg/dL (H)). Liver Function Tests: Recent Labs  Lab 12/17/21 0024 12/18/21 0029  AST 23 36  ALT 12 17  ALKPHOS 63 61  BILITOT 0.6 0.2*  PROT 5.3* 5.5*  ALBUMIN <1.5* <1.5*   No results for input(s): "LIPASE", "AMYLASE" in the last 168 hours. No results for input(s): "AMMONIA" in the last 168 hours. Coagulation Profile: No results for input(s): "INR", "PROTIME" in the last 168 hours. Cardiac Enzymes: No results for input(s): "CKTOTAL", "CKMB", "CKMBINDEX", "TROPONINI" in the last 168 hours. BNP (last 3 results) No results for input(s): "PROBNP" in the last 8760 hours. HbA1C: No results for input(s): "HGBA1C" in the last 72 hours. CBG: Recent Labs  Lab 12/19/21 1249 12/19/21 1618 12/19/21 1644 12/19/21 2116 12/20/21 0601  GLUCAP 135* 340* 348* 376* 317*   Lipid Profile: No results for input(s): "CHOL", "HDL", "LDLCALC", "TRIG", "CHOLHDL", "LDLDIRECT" in the last 72 hours. Thyroid Function Tests: No results for input(s): "TSH", "T4TOTAL", "FREET4", "T3FREE", "THYROIDAB" in the last 72 hours. Anemia Panel: No results for input(s): "VITAMINB12", "FOLATE", "FERRITIN", "TIBC", "IRON", "RETICCTPCT" in the last 72 hours. Urine analysis:    Component Value Date/Time   COLORURINE YELLOW 12/16/2021 1327   APPEARANCEUR HAZY (A) 12/16/2021 1327   LABSPEC 1.032 (H) 12/16/2021 1327   PHURINE 5.0 12/16/2021 1327   GLUCOSEU >=500 (A) 12/16/2021 1327   HGBUR MODERATE (A) 12/16/2021 1327   BILIRUBINUR NEGATIVE 12/16/2021 1327    BILIRUBINUR negative 05/06/2020 1113   KETONESUR 20 (A) 12/16/2021 1327   PROTEINUR >=300 (A) 12/16/2021 1327   UROBILINOGEN 0.2 05/06/2020 1113   UROBILINOGEN 0.2 01/21/2010 1330   NITRITE NEGATIVE 12/16/2021 1327   LEUKOCYTESUR NEGATIVE 12/16/2021 1327   Sepsis Labs: @LABRCNTIP (procalcitonin:4,lacticidven:4)  ) Recent Results (from the past 240 hour(s))  Resp Panel by RT-PCR (Flu A&B, Covid) Anterior Nasal Swab     Status: None   Collection Time: 12/16/21 12:49 PM   Specimen: Anterior Nasal Swab  Result Value Ref Range Status   SARS Coronavirus 2 by RT PCR NEGATIVE NEGATIVE Final    Comment: (NOTE) SARS-CoV-2 target nucleic acids are NOT DETECTED.  The SARS-CoV-2 RNA is generally detectable in upper respiratory specimens during the acute phase of infection. The lowest concentration of SARS-CoV-2 viral copies this assay can detect is 138 copies/mL. A negative result  does not preclude SARS-Cov-2 infection and should not be used as the sole basis for treatment or other patient management decisions. A negative result may occur with  improper specimen collection/handling, submission of specimen other than nasopharyngeal swab, presence of viral mutation(s) within the areas targeted by this assay, and inadequate number of viral copies(<138 copies/mL). A negative result must be combined with clinical observations, patient history, and epidemiological information. The expected result is Negative.  Fact Sheet for Patients:  BloggerCourse.com  Fact Sheet for Healthcare Providers:  SeriousBroker.it  This test is no t yet approved or cleared by the [place] FDA and  has been authorized for detection and/or diagnosis of SARS-CoV-2 by FDA under an Emergency Use Authorization (EUA). This EUA will remain  in effect (meaning this test can be used) for the duration of the COVID-19 declaration under Section 564(b)(1) of the Act,  21 U.S.C.section 360bbb-3(b)(1), unless the authorization is terminated  or revoked sooner.       Influenza A by PCR NEGATIVE NEGATIVE Final   Influenza B by PCR NEGATIVE NEGATIVE Final    Comment: (NOTE) The Xpert Xpress SARS-CoV-2/FLU/RSV plus assay is intended as an aid in the diagnosis of influenza from Nasopharyngeal swab specimens and should not be used as a sole basis for treatment. Nasal washings and aspirates are unacceptable for Xpert Xpress SARS-CoV-2/FLU/RSV testing.  Fact Sheet for Patients: BloggerCourse.com  Fact Sheet for Healthcare Providers: SeriousBroker.it  This test is not yet approved or cleared by the [place] FDA and has been authorized for detection and/or diagnosis of SARS-CoV-2 by FDA under an Emergency Use Authorization (EUA). This EUA will remain in effect (meaning this test can be used) for the duration of the COVID-19 declaration under Section 564(b)(1) of the Act, 21 U.S.C. section 360bbb-3(b)(1), unless the authorization is terminated or revoked.  Performed at [facility], [address] [place], [place] [address]      Radiology Studies: CARDIAC CATHETERIZATION  Result Date: 12/19/2021 Images from the original result were not included. RA: 5 mmHg RV: 45/0 mmHg PA: 51/11 mmHg, mPAP 27 mmHg PCW: 12 mmHg CO: 3.8 L/min CI: 2.2 L/min/m2 Please note that patient was on HFNC during the procedure owing to hypoxia. Fairly compensated ischemic cardiomyopathy Mild PH, WHO Grp II While he does have underlying cardiomyopathy, acute decompensation unlikely to be purely cardiogenic in nature Recommend holding diuretics in light of rising creatinine and normal filling pressures. [name], MD Pager: [phone] Office: [phone]  DG CHEST PORT 1 VIEW  Result Date: 12/19/2021 CLINICAL DATA:  Shortness of breath, hypoxia EXAM: PORTABLE CHEST 1 VIEW COMPARISON:  Previous studies  including the examination of 12/16/2021 FINDINGS: Transverse diameter of heart is increased. There is previous coronary bypass surgery. Central pulmonary vessels are prominent. There is interval increase in interstitial markings in both parahilar regions and lower lung fields. There is no focal consolidation. There is no significant pleural effusion or pneumothorax. IMPRESSION: Cardiomegaly. There is interval increase in interstitial markings in both lungs suggesting possible worsening of interstitial pulmonary edema or interstitial pneumonia. Electronically Signed   By: Ernie Avena M.D.   On: 12/19/2021 08:57     Scheduled Meds:  benzonatate  100 mg Oral TID   bisoprolol  5 mg Oral Daily   enoxaparin (LOVENOX) injection  30 mg Subcutaneous Q24H   guaiFENesin  600 mg Oral BID   insulin aspart  0-9 Units Subcutaneous TID WC   insulin aspart protamine- aspart  30 Units Subcutaneous BID  WC   ipratropium-albuterol  3 mL Nebulization QID   isosorbide-hydrALAZINE  0.5 tablet Oral TID   ivabradine  5 mg Oral BID WC   methylPREDNISolone (SOLU-MEDROL) injection  80 mg Intravenous Q24H   nicotine  14 mg Transdermal Daily   [START ON 12/21/2021] rosuvastatin  5 mg Oral Daily   sodium chloride flush  3 mL Intravenous Q12H   sodium chloride flush  3 mL Intravenous Q12H   Continuous Infusions:  sodium chloride     cefTRIAXone (ROCEPHIN)  IV 1 g (12/20/21 0935)   sodium chloride       LOS: 3 days    Time spent:  Zannie Cove, MD [facility]   12/20/2021, 10:28 AM

## 2021-12-20 NOTE — Progress Notes (Addendum)
Subjective:  Still has shortness of breath Leg edema improved  Objective:  Vital Signs in the last 24 hours: Temp:  [97.6 F (36.4 C)] 97.6 F (36.4 C) (11/15 0700) Pulse Rate:  [50-118] 60 (11/15 1000) Resp:  [18-31] 29 (11/15 0900) BP: (101-143)/(35-54) 134/45 (11/15 1000) SpO2:  [89 %-98 %] 96 % (11/15 1520) Weight:  [62.9 kg] 62.9 kg (11/15 0343)  Intake/Output from previous day: 11/14 0701 - 11/15 0700 In: 100 [IV Piggyback:100] Out: 975 [Urine:975]  Physical Exam Vitals and nursing note reviewed.  Constitutional:      General: He is not in acute distress. Neck:     Vascular: JVD present.  Cardiovascular:     Rate and Rhythm: Normal rate and regular rhythm.     Heart sounds: Normal heart sounds. No murmur heard. Pulmonary:     Effort: Accessory muscle usage present.     Breath sounds: Decreased air movement present. Examination of the right-upper field reveals wheezing. Examination of the left-upper field reveals wheezing. Examination of the right-middle field reveals wheezing. Examination of the left-middle field reveals wheezing. Examination of the right-lower field reveals wheezing. Examination of the left-lower field reveals wheezing. Wheezing present. No rales.  Musculoskeletal:     Right lower leg: No edema.     Left lower leg: No edema.      Imaging/tests reviewed and independently interpreted: CXR 12/16/2021: Cardiomegaly with mild, diffuse bilateral interstitial pulmonary opacity, likely edema. No focal airspace opacity.    Cardiac Studies: Telemetry 12/18/2021 No significant arhythmia  EKG 12/16/2021: Sinus rhythm with occasional Premature ventricular complexes Right axis deviation Anteroseptal infarct , age undetermined ST & T wave abnormality, consider inferolateral ischemia Abnormal ECG  RHC 12/19/2021: RA: 5 mmHg RV: 45/0 mmHg PA: 51/11 mmHg, mPAP 27 mmHg PCW: 12 mmHg   CO: 3.8 L/min CI: 2.2 L/min/m2   Please note that patient was on  HFNC during the procedure owing to hypoxia.   Fairly compensated ischemic cardiomyopathy Mild PH, WHO Grp II While he does have underlying cardiomyopathy, acute decompensation unlikely to be purely cardiogenic in nature Recommend holding diuretics in light of rising creatinine and normal filling pressures.    Echocardiogram 12/17/2021:  1. Left ventricular ejection fraction, by estimation, is 30 to 35%. Left  ventricular ejection fraction by 2D MOD biplane is 31.3 %. The left  ventricle has moderately decreased function. The left ventricle  demonstrates global hypokinesis. Left  ventricular diastolic parameters are consistent with Grade III diastolic  dysfunction (restrictive). Elevated left ventricular end-diastolic  pressure.   2. Right ventricular systolic function is mildly reduced. The right  ventricular size is normal. There is normal pulmonary artery systolic  pressure. The estimated right ventricular systolic pressure is 26.4 mmHg.   3. Left atrial size was moderately dilated.   4. The mitral valve is abnormal. Moderate to severe mitral valve  regurgitation.   5. The aortic valve is tricuspid. Aortic valve regurgitation is not  visualized. Aortic valve sclerosis/calcification is present, without any  evidence of aortic stenosis.   6. The inferior vena cava is normal in size with greater than 50%  respiratory variability, suggesting right atrial pressure of 3 mmHg.   7. Evidence of atrial level shunting detected by color flow Doppler.  There is a small patent foramen ovale with predominantly left to right  shunting across the atrial septum.   Comparison(s): Changes from prior study are noted. 08/23/2020: LVEF 20-30%.   Op note 08/23/2020 (Dr. Cliffton Asters): CABG X 3.  LIMA  LAD, RSVG PDA, OM1     Coronary angiography 08/22/2020: LM: Minimal disease LAD: Prox-mid long 80% stenosis Lcx: Lcx/OM1 95% ISR RCA: Prox 80% ISR, followed by In-stent CTO mid-distal vessel Left-to-right  collaterals to distal vessel Mildly decompensated ischemic cardiomyopathy Temp pacer placed through RIJ    Assessment & Recommendations:  71 year old [name] speaking patient with uncontrolled type 2 diabetes mellitus, mixed hyperlipidemia, tobacco dependence, coronary artery disease with prior MI, LCX and RCA stents in 2012, s/p CABG X 3.  LIMA LAD, RSVG PDA, OM1  (08/23/2020), admitted with acute on chronic systolic heart failure  Acute hypoxic respiratory failure: Underlying HFrEF, but current presentation likely combination of COPD exacerbation with underlying HFrEF After initial diuresis, now normal filling pressures on RHC on 12/19/2021 Hold diuretics. Agree with gentle hydration today in light of still rising Cr.  Unable to use ARNI, MRA, SGLT2i due to renal dysfunction. He absolutely needs strict I/O monitoring.  AKI: As above.  CAD:  S/o CABG No angina Trop elevation due to heart failure   Discussed interpretation of tests and management recommendations with the primary team.    [name], MD Pager: [phone] Office: [phone]

## 2021-12-20 NOTE — Care Management Important Message (Signed)
Important Message  Patient Details  Name: Colin [name] MRN: [id number] Date of Birth: 04/12/1950   Medicare Important Message Given:  Yes     Renie Ora 12/20/2021, 10:10 AM

## 2021-12-21 DIAGNOSIS — J441 Chronic obstructive pulmonary disease with (acute) exacerbation: Secondary | ICD-10-CM

## 2021-12-21 DIAGNOSIS — I2510 Atherosclerotic heart disease of native coronary artery without angina pectoris: Secondary | ICD-10-CM

## 2021-12-21 DIAGNOSIS — N179 Acute kidney failure, unspecified: Secondary | ICD-10-CM | POA: Diagnosis not present

## 2021-12-21 DIAGNOSIS — I5023 Acute on chronic systolic (congestive) heart failure: Secondary | ICD-10-CM | POA: Diagnosis not present

## 2021-12-21 DIAGNOSIS — E1169 Type 2 diabetes mellitus with other specified complication: Secondary | ICD-10-CM | POA: Diagnosis not present

## 2021-12-21 DIAGNOSIS — J449 Chronic obstructive pulmonary disease, unspecified: Secondary | ICD-10-CM | POA: Diagnosis present

## 2021-12-21 LAB — COMPREHENSIVE METABOLIC PANEL
ALT: 30 U/L (ref 0–44)
AST: 55 U/L — ABNORMAL HIGH (ref 15–41)
Albumin: <1.5 g/dL — ABNORMAL LOW (ref 3.5–5.0)
Alkaline Phosphatase: 117 U/L (ref 38–126)
Anion gap: 14 (ref 5–15)
BUN: 78 mg/dL — ABNORMAL HIGH (ref 8–23)
CO2: 20 mmol/L — ABNORMAL LOW (ref 22–32)
Calcium: 8.1 mg/dL — ABNORMAL LOW (ref 8.9–10.3)
Chloride: 107 mmol/L (ref 98–111)
Creatinine, Ser: 3.82 mg/dL — ABNORMAL HIGH (ref 0.61–1.24)
GFR, Estimated: 16 mL/min — ABNORMAL LOW (ref >60)
Glucose, Bld: 256 mg/dL — ABNORMAL HIGH (ref 70–99)
Potassium: 3.9 mmol/L (ref 3.5–5.1)
Sodium: 141 mmol/L (ref 135–145)
Total Bilirubin: 0.4 mg/dL (ref 0.3–1.2)
Total Protein: 5.3 g/dL — ABNORMAL LOW (ref 6.5–8.1)

## 2021-12-21 LAB — GLUCOSE, CAPILLARY
Glucose-Capillary: 274 mg/dL — ABNORMAL HIGH (ref 70–99)
Glucose-Capillary: 313 mg/dL — ABNORMAL HIGH (ref 70–99)
Glucose-Capillary: 418 mg/dL — ABNORMAL HIGH (ref 70–99)
Glucose-Capillary: 325 mg/dL — ABNORMAL HIGH (ref 70–99)

## 2021-12-21 LAB — CBC
HCT: 32.9 % — ABNORMAL LOW (ref 39.0–52.0)
Hemoglobin: 11.2 g/dL — ABNORMAL LOW (ref 13.0–17.0)
MCH: 30.9 pg (ref 26.0–34.0)
MCHC: 34 g/dL (ref 30.0–36.0)
MCV: 90.9 fL (ref 80.0–100.0)
Platelets: 273 10*3/uL (ref 150–400)
RBC: 3.62 MIL/uL — ABNORMAL LOW (ref 4.22–5.81)
RDW: 12.9 % (ref 11.5–15.5)
WBC: 19.7 10*3/uL — ABNORMAL HIGH (ref 4.0–10.5)
nRBC: 0.2 % (ref 0.0–0.2)

## 2021-12-21 LAB — LIPOPROTEIN A (LPA): Lipoprotein (a): 373.2 nmol/L — ABNORMAL HIGH (ref <75.0)

## 2021-12-21 MED ORDER — DEXTROMETHORPHAN POLISTIREX ER 30 MG/5ML PO SUER
30.0000 mg | Freq: Two times a day (BID) | ORAL | Status: DC | PRN
  Administered 2021-12-21 – 2021-12-23 (×4): 30 mg via ORAL
  Filled 2021-12-21 (×6): qty 5

## 2021-12-21 MED ORDER — MOMETASONE FURO-FORMOTEROL FUM 100-5 MCG/ACT IN AERO
2.0000 | INHALATION_SPRAY | Freq: Two times a day (BID) | RESPIRATORY_TRACT | Status: DC
  Administered 2021-12-21 – 2021-12-24 (×6): 2 via RESPIRATORY_TRACT
  Filled 2021-12-21: qty 8.8

## 2021-12-21 MED ORDER — IPRATROPIUM-ALBUTEROL 0.5-2.5 (3) MG/3ML IN SOLN
3.0000 mL | Freq: Four times a day (QID) | RESPIRATORY_TRACT | Status: DC
  Administered 2021-12-21 – 2021-12-22 (×3): 3 mL via RESPIRATORY_TRACT
  Filled 2021-12-21 (×3): qty 3

## 2021-12-21 MED ORDER — IPRATROPIUM-ALBUTEROL 0.5-2.5 (3) MG/3ML IN SOLN
3.0000 mL | Freq: Two times a day (BID) | RESPIRATORY_TRACT | Status: DC
  Administered 2021-12-21: 3 mL via RESPIRATORY_TRACT
  Filled 2021-12-21: qty 3

## 2021-12-21 NOTE — Plan of Care (Signed)
  Problem: Education: Goal: Individualized Educational Video(s) Outcome: Progressing   Problem: Activity: Goal: Capacity to carry out activities will improve Outcome: Progressing

## 2021-12-21 NOTE — Inpatient Diabetes Management (Signed)
Inpatient Diabetes Program Recommendations  AACE/ADA: New Consensus Statement on Inpatient Glycemic Control (2015)  Target Ranges:  Prepandial:   less than 140 mg/dL      Peak postprandial:   less than 180 mg/dL (1-2 hours)      Critically ill patients:  140 - 180 mg/dL   Lab Results  Component Value Date   GLUCAP 313 (H) 12/21/2021   HGBA1C 11.4 (H) 11/08/2021    Latest Reference Range & Units 12/20/21 15:48 12/20/21 21:50 12/21/21 06:20 12/21/21 11:16  Glucose-Capillary 70 - 99 mg/dL 947 (H) 654 (H) 650 (H) 313 (H)  (H): Data is abnormally high Review of Glycemic Control  Diabetes history: type 2 Outpatient Diabetes medications: Novolin 70/30 40 units BID Current orders for Inpatient glycemic control: 70/30 insulin 30 units BID, Novolog 0-9 units correction scale TID, Novolog 3 units TID  Inpatient Diabetes Program Recommendations:   Noted that blood sugars have been greater than 200 mg/dl.   Recommend increasing Novolog 70/30 insulin to 35 units BID, continue Novolog 0-9 units correction scale TID, add Novolog 0-5 units HS scale, increase Novolog to 5 units TID with meals if eating at least 50% of meal.   Smith Mince RN BSN CDE Diabetes Coordinator Pager: [phone]  8am-5pm

## 2021-12-21 NOTE — TOC Progression Note (Addendum)
Transition of Care [facility]) - Progression Note    Patient Details  Name: Colin [name] MRN: [id number] Date of Birth: [birth year]  Transition of [facility]) CM/SW Contact  Leone Haven, RN Phone Number: 12/21/2021, 3:51 PM  Clinical Narrative:    NCM spoke with patient at the bedside, tried to use the video intrepreter, patient states to call his son.  NCM called [name], offered choice for HHPT, he does not have a preference, NCM made referral to [facility] with [facility], awaiting to hear back. [name] states he can take referral.  Soc will begin 24 to 48hrs post dc.    Expected Discharge Plan: Home w Home Health Services Barriers to Discharge: Continued Medical Work up  Expected Discharge Plan and Services Expected Discharge Plan: Home w Home Health Services In-house Referral: NA Discharge Planning Services: CM Consult Post Acute Care Choice: Home Health Living arrangements for the past 2 months: Single Family Home                 DME Arranged: N/A DME Agency: NA       HH Arranged: PT HH Agency [facility] Date HH Agency Contacted: 12/21/21 Time HH Agency Contacted: 1550 Representative spoke with at [employer]   Social Determinants of Health (SDOH) Interventions    Readmission Risk Interventions     No data to display

## 2021-12-21 NOTE — Hospital Course (Addendum)
[name] was admitted to the hospital with the working diagnosis of heart failure decompensation.  71 yo male with the past medical history of coronary artery disease, T2DM, dyslipidemia and heart failure, who presented with dyspnea. Apparently patient had worsening dyspnea for 3 days, associated with worsening edema that prompted him to come to the ED. On his initial physical examination his blood pressure was 159/66, HR 94, RR 21 and 02 saturation 98%, lungs with wheezing and rales with no rhonchi, heart with S1 and S2 present and rhythmic with no gallops, abdomen with no distention, positive lower extremity edema.   Na 137, K 4,0 CL 105 bicarbonate 22, glucose 359 bun 29 cr 2,0  BNP 2,479 High sensitive troponin 191, 228  Wbc 11.1 hgb 13,7 plt 230  Sars covid 19 negative Urine analysis SG 1.032, > 300 protein, > 500 glucose, negative leukocytes.   Chest radiograph with no cardiomegaly, bilateral hilar vascular congestion, no effusions.  EKG 93 bpm, normal axis, normal intervals, sinus rhythm with PVC, J point elevation in V1 and V3, ST depression in V6, T wave inversion in lead II, III, AvF, V5 and V6 (chronic changes).   Patient was placed on furosemide for diuresis. Echocardiogram with reduction in systolic function.   11/17 renal function improving, patient very weak and deconditioned.  11/18 his volume status has improved, limited pharmacologic options due to reduced GFR Patient will need close follow up as outpatient,

## 2021-12-21 NOTE — Assessment & Plan Note (Addendum)
Uncontrolled with hyperglycemia  Fasting glucose this am is 316. Capillary 242 and 115  Patient now off systemic corticosteroids, continue with current insulin therapy.

## 2021-12-21 NOTE — Assessment & Plan Note (Addendum)
CKD stage 3a   Patient off diuretic therapy, his renal function has a serum cr of 3,41 with K at 3,8 and serum bicarbonate at 21. Plan to continue to hold on diuretic therapy for now and follow up renal function in am.   Hypoalbuminemia.

## 2021-12-21 NOTE — Assessment & Plan Note (Addendum)
Echocardiogram with reduced LV systolic function with EF 30 to 35%, with global hypokinesis. RV systolic function with mild reduction. LA with moderate dilatation. Moderate to severe MR, RVSP 26,4   11/14 cardiac catheterization  RA 5 RV 45/0 PA 51/11, mean 27  PCW 12 Co 3,8 and indecx 2,2   Urine output is 1350 ml Systolic blood pressure 128 to 155 mmHg.  After load reduction with hydralazine and isosorbide Continue with ivabradine.  Will resume oral loop diuretic therapy in the next 24 hrs   Acute hypoxemic respiratory failure due to acute cardiogenic pulmonary edema. Current 02 oxymetry is 94% on room air.  Follow up chest radiograph today.

## 2021-12-21 NOTE — Assessment & Plan Note (Addendum)
Patient with improvement in dyspnea.  Pneumonia has been ruled out. Reactive leukocytosis is improving, at the time of his discharge is 14,7   Continue bronchodilator therapy.

## 2021-12-21 NOTE — Assessment & Plan Note (Addendum)
Continue blood pressure control with Bidil Continue diuresis with furosemide

## 2021-12-21 NOTE — Evaluation (Signed)
Physical Therapy Evaluation Patient Details Name: AWAIS MANZA MRN: [id number] DOB: 05-12-1950 Today's Date: 12/21/2021  History of Present Illness  The pt is a 71 yo male presenting 11/11 with SOB and BLE edema. Work up revealed acute CHF exacerbation. S/p RHC on 11/14. PMH includes: CAD s/p stent x2, DM II, and HTN.   Clinical Impression  Pt in bed upon arrival of PT, agreeable to evaluation at this time. Prior to admission the pt was independent with mobility, reports using RW, cane, or no DME for mobility, lives with wife and grandson. The pt now presents with limitations in functional mobility, endurance, and stability due to above dx, and will continue to benefit from skilled PT to address these deficits. He was able to complete sit-stand transfers without physical assist, but did require BUE support on RW for stability with transfers and gait. No overt LOB with gait, but did need increased time for safety. Will continue to progress mobility and endurance during admission, will be safe to return home with family when medically cleared for d/c.         Recommendations for follow up therapy are one component of a multi-disciplinary discharge planning process, led by the attending physician.  Recommendations may be updated based on patient status, additional functional criteria and insurance authorization.  Follow Up Recommendations Home health PT      Assistance Recommended at Discharge Intermittent Supervision/Assistance  Patient can return home with the following  A little help with walking and/or transfers;A little help with bathing/dressing/bathroom;Assistance with cooking/housework;Assist for transportation;Help with stairs or ramp for entrance    Equipment Recommendations None recommended by PT  Recommendations for Other Services       Functional Status Assessment Patient has had a recent decline in their functional status and demonstrates the ability to make significant  improvements in function in a reasonable and predictable amount of time.     Precautions / Restrictions Precautions Precautions: None Restrictions Weight Bearing Restrictions: No      Mobility  Bed Mobility Overal bed mobility: Modified Independent             General bed mobility comments: no assist given, supervision for lines    Transfers Overall transfer level: Needs assistance Equipment used: Rolling walker (2 wheels) Transfers: Sit to/from Stand Sit to Stand: Supervision           General transfer comment: supervision for safety, stable with RW    Ambulation/Gait Ambulation/Gait assistance: Supervision Gait Distance (Feet): 75 Feet Assistive device: Rolling walker (2 wheels) Gait Pattern/deviations: Step-through pattern, Decreased stride length Gait velocity: decreased Gait velocity interpretation: <1.31 ft/sec, indicative of household ambulator   General Gait Details: decreased, but stable with RW. SpO2 stable on RA      Balance Overall balance assessment: Mild deficits observed, not formally tested                                           Pertinent Vitals/Pain Pain Assessment Pain Assessment: No/denies pain    Home Living Family/patient expects to be discharged to:: Private residence Living Arrangements: Spouse/significant other Available Help at Discharge: Family;Available PRN/intermittently Type of Home: House Home Access: Stairs to enter Entrance Stairs-Rails: None Entrance Stairs-Number of Steps: 2   Home Layout: Multi-level;Able to live on main level with bedroom/bathroom Home Equipment: Rolling Walker (2 wheels);Shower seat;Grab bars - tub/shower  Prior Function Prior Level of Function : Independent/Modified Independent;Driving             Mobility Comments: pt denies falls, reports use of RW or cane or no DME depending on day ADLs Comments: pt reports independence     Hand Dominance   Dominant  Hand: Right    Extremity/Trunk Assessment   Upper Extremity Assessment Upper Extremity Assessment: Overall WFL for tasks assessed    Lower Extremity Assessment Lower Extremity Assessment: Overall WFL for tasks assessed    Cervical / Trunk Assessment Cervical / Trunk Assessment: Normal  Communication   Communication: Prefers language other than English  Cognition Arousal/Alertness: Awake/alert Behavior During Therapy: WFL for tasks assessed/performed Overall Cognitive Status: Within Functional Limits for tasks assessed                                 General Comments: pt following instructions well, not formally assessed        General Comments General comments (skin integrity, edema, etc.): VSS on RA        Assessment/Plan    PT Assessment Patient needs continued PT services  PT Problem List Cardiopulmonary status limiting activity;Decreased activity tolerance;Decreased mobility       PT Treatment Interventions DME instruction;Gait training;Stair training;Functional mobility training;Therapeutic activities;Therapeutic exercise;Balance training;Patient/family education    PT Goals (Current goals can be found in the Care Plan section)  Acute Rehab PT Goals Patient Stated Goal: to go home PT Goal Formulation: With patient Time For Goal Achievement: 01/04/22 Potential to Achieve Goals: Good    Frequency Min 3X/week        AM-PAC PT "6 Clicks" Mobility  Outcome Measure Help needed turning from your back to your side while in a flat bed without using bedrails?: None Help needed moving from lying on your back to sitting on the side of a flat bed without using bedrails?: None Help needed moving to and from a bed to a chair (including a wheelchair)?: A Little Help needed standing up from a chair using your arms (e.g., wheelchair or bedside chair)?: A Little Help needed to walk in hospital room?: A Little Help needed climbing 3-5 steps with a railing? :  A Little 6 Click Score: 20    End of Session Equipment Utilized During Treatment: Gait belt Activity Tolerance: Patient tolerated treatment well Patient left: in bed;with call bell/phone within reach;with bed alarm set Nurse Communication: Mobility status PT Visit Diagnosis: Unsteadiness on feet (R26.81)    Time: 1194-1740 PT Time Calculation (min) (ACUTE ONLY): 31 min   Charges:   PT Evaluation $PT Eval Low Complexity: 1 Low PT Treatments $Therapeutic Exercise: 8-22 mins        Vickki Muff, PT, DPT   Acute Rehabilitation Department  Ronnie Derby 12/21/2021, 9:47 AM

## 2021-12-21 NOTE — Assessment & Plan Note (Signed)
No signs of acute coronary syndrome Continue with antiplatelet therapy and statin

## 2021-12-21 NOTE — Plan of Care (Signed)
Care Plan reviewed 

## 2021-12-21 NOTE — Progress Notes (Signed)
Resting peacefully, on room air. Cr continues to increase, suspect ATN at this point. Can monitor I/O at this point, without additional fluids.   [name], MD Pager: [phone] Office: [phone]

## 2021-12-21 NOTE — Progress Notes (Addendum)
Progress Note   Patient: Colin [name] OEU:[id number] DOB: 1950/03/17 DOA: 12/16/2021     4 DOS: the patient was seen and examined on 12/21/2021   Brief hospital course: Mr. Rosas was admitted to the hospital with the working diagnosis of heart failure decompensation.  71 [name] male with the past medical history of coronary artery disease, T2DM, dyslipidemia and heart failure, who presented with dyspnea. Apparently patient had worsening dyspnea for 3 days, associated with worsening edema that prompted him to come to the ED. On his initial physical examination his blood pressure was 159/66, HR 94, RR 21 and 02 saturation 98%, lungs with wheezing and rales with no rhonchi, heart with S1 and S2 present and rhythmic with no gallops, abdomen with no distention, positive lower extremity edema.   Na 137, K 4,0 CL 105 bicarbonate 22, glucose 359 bun 29 cr 2,0  BNP 2,479 High sensitive troponin 191, 228  Wbc 11.1 hgb 13,7 plt 230  Sars covid 19 negative Urine analysis SG 1.032, > 300 protein, > 500 glucose, negative leukocytes.   Chest radiograph with no cardiomegaly, bilateral hilar vascular congestion, no effusions.  EKG 93 bpm, normal axis, normal intervals, sinus rhythm with PVC, J point elevation in V1 and V3, ST depression in V6, T wave inversion in lead II, III, AvF, V5 and V6 (chronic changes).   Patient was placed on furosemide for diuresis. Echocardiogram with reduction in systolic function.   Assessment and Plan: * Acute on chronic systolic CHF (congestive heart failure) (HCC) Echocardiogram with reduced LV systolic function with EF 30 to 35%, with global hypokinesis. RV systolic function with mild reduction. LA with moderate dilatation. Moderate to severe MR, RVSP 26,4   11/14 cardiac catheterization  RA 5 RV 45/0 PA 51/11, mean 27  PCW 12 Co 3,8 and indecx 2,2   Urine output id 800  Systolic blood pressure 120 to 150 mmHg.  Plan to continue to hold on diuretic therapy.   Continue after load reduction with hydralazine and isosorbide Continue with ivabradine.   Acute hypoxemic respiratory failure due to acute cardiogenic pulmonary edema. Current 02 oxymetry is 90% on room air.    AKI (acute kidney injury) (HCC) CKD stage 3a   Renal function with serum cr at 3,8 with K at 3,9 and serum bicarbonate at 20 Continue close monitoring of renal function Avoid hypotension and nephrotoxic medications.   Hypoalbuminemia.   Coronary artery disease No signs of acute coronary syndrome Continue with antiplatelet therapy and statin   Essential hypertension Continue blood pressure control with [employer] on diuretic therapy for now.   Type 2 diabetes mellitus with hyperlipidemia (HCC) Uncontrolled with hyperglycemia  Continue glucose control with insulin therapy.  Continue with statin therapy.   COPD with acute exacerbation [facility]) Patient with improvement in dyspnea.  No wheezing and positive reactive leukocytosis up to 19.  Plan to continue bronchodilator therapy and inhales corticosteroids Hold on systemic corticosteroids Out of bed and ambulate Airway clearing techniques with flutter valve and incentive spirometer.   Follow up chest radiograph with interstitial edema with no frank alveolar infiltrates will discontinue antibiotic therapy, pneumonia ruled out.         Subjective: Patient with improvement in dyspnea, no chest pain,  Physical Exam: Vitals:   12/21/21 0427 12/21/21 0500 12/21/21 0807 12/21/21 1115  BP:  (!) 140/47 (!) 147/55 (!) 123/42  Pulse: 60 (!) 59 64 (!) 59  Resp:  19 20 19   Temp:    97.6 F (  36.4 C)  TempSrc:    Oral  SpO2:  93% 94% 90%  Weight:      Height:       Neurology awake and alert ENT with mild pallor Cardiovascular with S1 and S2 present and rhythmic with no gallops Respiratory with no rales or wheezing Abdomen with no distention  No lower extremity edema  Data Reviewed:    Family Communication:  no family at the bedside   Disposition: Status is: Inpatient Remains inpatient appropriate because: heart failure   Planned Discharge Destination: Home     Author: Coralie Keens, MD 12/21/2021 12:13 PM  For on call review [email or web address].

## 2021-12-22 ENCOUNTER — Inpatient Hospital Stay (HOSPITAL_COMMUNITY): Payer: Medicare Other

## 2021-12-22 DIAGNOSIS — I2510 Atherosclerotic heart disease of native coronary artery without angina pectoris: Secondary | ICD-10-CM | POA: Diagnosis not present

## 2021-12-22 DIAGNOSIS — N179 Acute kidney failure, unspecified: Secondary | ICD-10-CM | POA: Diagnosis not present

## 2021-12-22 DIAGNOSIS — E1169 Type 2 diabetes mellitus with other specified complication: Secondary | ICD-10-CM | POA: Diagnosis not present

## 2021-12-22 DIAGNOSIS — I5023 Acute on chronic systolic (congestive) heart failure: Secondary | ICD-10-CM | POA: Diagnosis not present

## 2021-12-22 LAB — BASIC METABOLIC PANEL
Anion gap: 13 (ref 5–15)
BUN: 70 mg/dL — ABNORMAL HIGH (ref 8–23)
CO2: 21 mmol/L — ABNORMAL LOW (ref 22–32)
Calcium: 8.2 mg/dL — ABNORMAL LOW (ref 8.9–10.3)
Chloride: 108 mmol/L (ref 98–111)
Creatinine, Ser: 3.41 mg/dL — ABNORMAL HIGH (ref 0.61–1.24)
GFR, Estimated: 18 mL/min — ABNORMAL LOW (ref >60)
Glucose, Bld: 316 mg/dL — ABNORMAL HIGH (ref 70–99)
Potassium: 3.8 mmol/L (ref 3.5–5.1)
Sodium: 142 mmol/L (ref 135–145)

## 2021-12-22 LAB — GLUCOSE, CAPILLARY
Glucose-Capillary: 242 mg/dL — ABNORMAL HIGH (ref 70–99)
Glucose-Capillary: 115 mg/dL — ABNORMAL HIGH (ref 70–99)
Glucose-Capillary: 222 mg/dL — ABNORMAL HIGH (ref 70–99)
Glucose-Capillary: 234 mg/dL — ABNORMAL HIGH (ref 70–99)

## 2021-12-22 LAB — CBC
HCT: 32.8 % — ABNORMAL LOW (ref 39.0–52.0)
Hemoglobin: 11 g/dL — ABNORMAL LOW (ref 13.0–17.0)
MCH: 30.7 pg (ref 26.0–34.0)
MCHC: 33.5 g/dL (ref 30.0–36.0)
MCV: 91.6 fL (ref 80.0–100.0)
Platelets: 287 10*3/uL (ref 150–400)
RBC: 3.58 MIL/uL — ABNORMAL LOW (ref 4.22–5.81)
RDW: 13.1 % (ref 11.5–15.5)
WBC: 18.9 10*3/uL — ABNORMAL HIGH (ref 4.0–10.5)
nRBC: 0.3 % — ABNORMAL HIGH (ref 0.0–0.2)

## 2021-12-22 MED ORDER — FUROSEMIDE 10 MG/ML IJ SOLN
60.0000 mg | Freq: Once | INTRAMUSCULAR | Status: AC
  Administered 2021-12-22: 60 mg via INTRAVENOUS
  Filled 2021-12-22: qty 6

## 2021-12-22 MED ORDER — IPRATROPIUM-ALBUTEROL 0.5-2.5 (3) MG/3ML IN SOLN
3.0000 mL | RESPIRATORY_TRACT | Status: DC | PRN
  Filled 2021-12-22: qty 3

## 2021-12-22 MED ORDER — IPRATROPIUM-ALBUTEROL 0.5-2.5 (3) MG/3ML IN SOLN
3.0000 mL | Freq: Two times a day (BID) | RESPIRATORY_TRACT | Status: DC
  Administered 2021-12-22 – 2021-12-24 (×5): 3 mL via RESPIRATORY_TRACT
  Filled 2021-12-22 (×4): qty 3

## 2021-12-22 NOTE — Progress Notes (Signed)
Progress Note   Patient: Colin [name]:[id number] DOB: [birth year] DOA: 12/16/2021     5 DOS: the patient was seen and examined on 12/22/2021   Brief hospital course: Colin [name] was admitted to the hospital with the working diagnosis of heart failure decompensation.  71 yo male with the past medical history of coronary artery disease, T2DM, dyslipidemia and heart failure, who presented with dyspnea. Apparently patient had worsening dyspnea for 3 days, associated with worsening edema that prompted him to come to the ED. On his initial physical examination his blood pressure was 159/66, HR 94, RR 21 and 02 saturation 98%, lungs with wheezing and rales with no rhonchi, heart with S1 and S2 present and rhythmic with no gallops, abdomen with no distention, positive lower extremity edema.   Na 137, K 4,0 CL 105 bicarbonate 22, glucose 359 bun 29 cr 2,0  BNP 2,479 High sensitive troponin 191, 228  Wbc 11.1 hgb 13,7 plt 230  Sars covid 19 negative Urine analysis SG 1.032, > 300 protein, > 500 glucose, negative leukocytes.   Chest radiograph with no cardiomegaly, bilateral hilar vascular congestion, no effusions.  EKG 93 bpm, normal axis, normal intervals, sinus rhythm with PVC, J point elevation in V1 and V3, ST depression in V6, T wave inversion in lead II, III, AvF, V5 and V6 (chronic changes).   Patient was placed on furosemide for diuresis. Echocardiogram with reduction in systolic function.   11/17 renal function improving, patient very weak and deconditioned.   Assessment and Plan: * Acute on chronic systolic CHF (congestive heart failure) (HCC) Echocardiogram with reduced LV systolic function with EF 30 to 35%, with global hypokinesis. RV systolic function with mild reduction. LA with moderate dilatation. Moderate to severe MR, RVSP 26,4   11/14 cardiac catheterization  RA 5 RV 45/0 PA 51/11, mean 27  PCW 12 Co 3,8 and indecx 2,2   Urine output is 1350 ml Systolic blood pressure  128 to 155 mmHg.  After load reduction with hydralazine and isosorbide Continue with ivabradine.  Will resume oral loop diuretic therapy in the next 24 hrs   Acute hypoxemic respiratory failure due to acute cardiogenic pulmonary edema. Current 02 oxymetry is 94% on room air.  Follow up chest radiograph today.    AKI (acute kidney injury) (HCC) CKD stage 3a   Patient off diuretic therapy, his renal function has a serum cr of 3,41 with K at 3,8 and serum bicarbonate at 21. Plan to continue to hold on diuretic therapy for now and follow up renal function in am.   Hypoalbuminemia.   Coronary artery disease No signs of acute coronary syndrome Continue with antiplatelet therapy and statin   Essential hypertension Continue blood pressure control with [employer] on diuretic therapy for now.   Type 2 diabetes mellitus with hyperlipidemia (HCC) Uncontrolled with hyperglycemia  Fasting glucose this am is 316. Capillary 242 and 115  Patient now off systemic corticosteroids, continue with current insulin therapy.   COPD with acute exacerbation [facility]) Patient with improvement in dyspnea.  No wheezing and positive reactive leukocytosis up to 19.  Plan to continue bronchodilator therapy and inhales corticosteroids Out of bed and ambulate Airway clearing techniques with flutter valve and incentive spirometer.        Subjective: Patient with no chest pain, has some dyspnea, very weak and deconditioned.   Physical Exam: Vitals:   12/22/21 0346 12/22/21 0700 12/22/21 0743 12/22/21 1010  BP: (!) 158/61 (!) 140/61  (!) 155/54  Pulse: 60   60  Resp: 16 20  20   Temp: 97.6 F (36.4 C)     TempSrc: Oral     SpO2: 93%  94%   Weight: 60.1 kg     Height:       Neurology awake and alert ENT with mild pallor Cardiovascular with S1 and S2 present and rhythmic with no gallops or rubs, positive systolic murmur a the apex No JVD Respiratory with scattered rales and rhonchi Abdomen  with no distention Trace lower extremity edema  Data Reviewed:    Family Communication: no family at the bedside. I spoke with patient's son over the phone we talked in detail about patient's condition, plan of care and prognosis and all questions were addressed.   Disposition: Status is: Inpatient Remains inpatient appropriate because: heart failure and renal failure   Planned Discharge Destination: Home possible dc home tomorrow    Author: , MD 12/22/2021 2:11 PM  For on call review [email or web address].

## 2021-12-22 NOTE — Progress Notes (Signed)
Physical Therapy Treatment Patient Details Name: Colin [name] MRN: [id number] DOB: [birth year] Today's Date: 12/22/2021   History of Present Illness The pt is a 71 yo male presenting 11/11 with SOB and BLE edema. Work up revealed acute CHF exacerbation. S/p RHC on 11/14. PMH includes: CAD s/p stent x2, DM II, and HTN.    PT Comments    The pt was agreeable to session despite reports of increased fatigue today. He was able to greatly progress hallway ambulation distance (~175 ft) with use of RW and VSS, and completed ambulation in the room without need for RW or physical assist from therapist. He demos increased drifting and sway when walking without UE support, will continue to benefit from skilled PT to progress endurance and stability without need for BUE support.     Recommendations for follow up therapy are one component of a multi-disciplinary discharge planning process, led by the attending physician.  Recommendations may be updated based on patient status, additional functional criteria and insurance authorization.  Follow Up Recommendations  Home health PT     Assistance Recommended at Discharge Intermittent Supervision/Assistance  Patient can return home with the following A little help with walking and/or transfers;A little help with bathing/dressing/bathroom;Assistance with cooking/housework;Assist for transportation;Help with stairs or ramp for entrance   Equipment Recommendations  None recommended by PT    Recommendations for Other Services       Precautions / Restrictions Precautions Precautions: None Restrictions Weight Bearing Restrictions: No     Mobility  Bed Mobility Overal bed mobility: Modified Independent             General bed mobility comments: no assist given, supervision for lines    Transfers Overall transfer level: Needs assistance Equipment used: Rolling walker (2 wheels), None Transfers: Sit to/from Stand Sit to Stand: Supervision            General transfer comment: supervision for safety, stable with RW and able to return to sitting and power up without    Ambulation/Gait Ambulation/Gait assistance: Supervision, Min guard Gait Distance (Feet): 175 Feet Assistive device: Rolling walker (2 wheels), None Gait Pattern/deviations: Step-through pattern, Decreased stride length Gait velocity: decreased Gait velocity interpretation: <1.31 ft/sec, indicative of household ambulator   General Gait Details: decreased speed, more stable with RW than with no DME but able to complete short distance in room without UE support.      Balance Overall balance assessment: Mild deficits observed, not formally tested                                          Cognition Arousal/Alertness: Awake/alert Behavior During Therapy: WFL for tasks assessed/performed Overall Cognitive Status: Within Functional Limits for tasks assessed                                 General Comments: pt following instructions well, not formally assessed        Exercises      General Comments General comments (skin integrity, edema, etc.): VSS on RA      Pertinent Vitals/Pain Pain Assessment Pain Assessment: No/denies pain     PT Goals (current goals can now be found in the care plan section) Acute Rehab PT Goals Patient Stated Goal: to go home PT Goal Formulation: With patient Time For Goal Achievement: 01/04/22 Potential  to Achieve Goals: Good Progress towards PT goals: Progressing toward goals    Frequency    Min 3X/week      PT Plan Current plan remains appropriate       AM-PAC PT "6 Clicks" Mobility   Outcome Measure  Help needed turning from your back to your side while in a flat bed without using bedrails?: None Help needed moving from lying on your back to sitting on the side of a flat bed without using bedrails?: None Help needed moving to and from a bed to a chair (including a  wheelchair)?: A Little Help needed standing up from a chair using your arms (e.g., wheelchair or bedside chair)?: A Little Help needed to walk in hospital room?: A Little Help needed climbing 3-5 steps with a railing? : A Little 6 Click Score: 20    End of Session Equipment Utilized During Treatment: Gait belt Activity Tolerance: Patient tolerated treatment well Patient left: in bed;with call bell/phone within reach;with bed alarm set Nurse Communication: Mobility status PT Visit Diagnosis: Unsteadiness on feet (R26.81)     Time: 0347-4259 PT Time Calculation (min) (ACUTE ONLY): 16 min  Charges:  $Gait Training: 8-22 mins                     Vickki Muff, PT, DPT   Acute Rehabilitation Department   Ronnie Derby 12/22/2021, 4:03 PM

## 2021-12-22 NOTE — Progress Notes (Signed)
Patient states "I can't breathe" while sitting up at the edge of the bed. RN grab VSS and VSS stable. Patient states no chest pain but "pain from coughing". MD ordered DG chest view and order one time dose of IV 60mg  lasix. RN relayed to th patient to let RN know he feels unwell or chest pain. Patient verbalize understanding.

## 2021-12-23 DIAGNOSIS — E1169 Type 2 diabetes mellitus with other specified complication: Secondary | ICD-10-CM | POA: Diagnosis not present

## 2021-12-23 DIAGNOSIS — I5023 Acute on chronic systolic (congestive) heart failure: Secondary | ICD-10-CM | POA: Diagnosis not present

## 2021-12-23 DIAGNOSIS — I2510 Atherosclerotic heart disease of native coronary artery without angina pectoris: Secondary | ICD-10-CM | POA: Diagnosis not present

## 2021-12-23 DIAGNOSIS — N179 Acute kidney failure, unspecified: Secondary | ICD-10-CM | POA: Diagnosis not present

## 2021-12-23 LAB — GLUCOSE, CAPILLARY
Glucose-Capillary: 299 mg/dL — ABNORMAL HIGH (ref 70–99)
Glucose-Capillary: 211 mg/dL — ABNORMAL HIGH (ref 70–99)
Glucose-Capillary: 188 mg/dL — ABNORMAL HIGH (ref 70–99)
Glucose-Capillary: 295 mg/dL — ABNORMAL HIGH (ref 70–99)

## 2021-12-23 LAB — CBC
HCT: 36.1 % — ABNORMAL LOW (ref 39.0–52.0)
Hemoglobin: 12.2 g/dL — ABNORMAL LOW (ref 13.0–17.0)
MCH: 31.4 pg (ref 26.0–34.0)
MCHC: 33.8 g/dL (ref 30.0–36.0)
MCV: 92.8 fL (ref 80.0–100.0)
Platelets: 296 10*3/uL (ref 150–400)
RBC: 3.89 MIL/uL — ABNORMAL LOW (ref 4.22–5.81)
RDW: 13.1 % (ref 11.5–15.5)
WBC: 14.7 10*3/uL — ABNORMAL HIGH (ref 4.0–10.5)
nRBC: 0.6 % — ABNORMAL HIGH (ref 0.0–0.2)

## 2021-12-23 LAB — BASIC METABOLIC PANEL
Anion gap: 10 (ref 5–15)
BUN: 63 mg/dL — ABNORMAL HIGH (ref 8–23)
CO2: 25 mmol/L (ref 22–32)
Calcium: 8.3 mg/dL — ABNORMAL LOW (ref 8.9–10.3)
Chloride: 108 mmol/L (ref 98–111)
Creatinine, Ser: 3.44 mg/dL — ABNORMAL HIGH (ref 0.61–1.24)
GFR, Estimated: 18 mL/min — ABNORMAL LOW (ref >60)
Glucose, Bld: 282 mg/dL — ABNORMAL HIGH (ref 70–99)
Potassium: 4.5 mmol/L (ref 3.5–5.1)
Sodium: 143 mmol/L (ref 135–145)

## 2021-12-23 MED ORDER — IVABRADINE HCL 5 MG PO TABS
5.0000 mg | ORAL_TABLET | Freq: Two times a day (BID) | ORAL | 0 refills | Status: DC
  Filled 2021-12-23: qty 60, 30d supply, fill #0

## 2021-12-23 MED ORDER — FUROSEMIDE 20 MG PO TABS
60.0000 mg | ORAL_TABLET | Freq: Every day | ORAL | 0 refills | Status: DC
  Filled 2021-12-23: qty 30, 10d supply, fill #0

## 2021-12-23 MED ORDER — FUROSEMIDE 40 MG PO TABS
60.0000 mg | ORAL_TABLET | Freq: Every day | ORAL | Status: DC
  Administered 2021-12-23 – 2021-12-24 (×2): 60 mg via ORAL
  Filled 2021-12-23 (×2): qty 1

## 2021-12-23 MED ORDER — ISOSORB DINITRATE-HYDRALAZINE 20-37.5 MG PO TABS
0.5000 | ORAL_TABLET | Freq: Three times a day (TID) | ORAL | 0 refills | Status: DC
  Filled 2021-12-23: qty 45, 30d supply, fill #0

## 2021-12-23 MED ORDER — ROSUVASTATIN CALCIUM 5 MG PO TABS
5.0000 mg | ORAL_TABLET | Freq: Every day | ORAL | 0 refills | Status: DC
  Filled 2021-12-23: qty 30, 30d supply, fill #0

## 2021-12-23 NOTE — Progress Notes (Signed)
Patient's son Lenn Sink is at the bedside has concerns about the patient's current condition and mobility. RN provided education on diuretics and mobility at home. RN paged MD to make aware. Discharge on hold. MD to call family to discuss concerns.

## 2021-12-23 NOTE — Discharge Summary (Addendum)
Physician Discharge Summary   Patient: Colin [name] MRN: [id number] DOB: 1950-05-12  Admit date:     12/16/2021  Discharge date: 12/23/21  Discharge Physician: Jimmy Picket Neeti Knudtson   PCP: Kerin Perna, NP   Recommendations at discharge:    Patient will continue diuresis with furosemide 60 mg daily Limited heart failure guideline therapy due to low GFR, continue afterload reduction with bidil Holding on B  blocker due to risk of bradycardia Follow up renal function and electrolytes in 7 days Follow up with Juluis Mire NP in 7 to 10 days Follow up with Cardiology as scheduled.   I spoke with patient's son over the phone, we talked in detail about patient's condition, plan of care and prognosis and all questions were addressed.   Discharge Diagnoses: Principal Problem:   Acute on chronic systolic CHF (congestive heart failure) (HCC) Active Problems:   AKI (acute kidney injury) ([place])   Coronary artery disease   Essential hypertension   Type 2 diabetes mellitus with hyperlipidemia ([place])   COPD with acute exacerbation ([place])  Resolved Problems:   * No resolved hospital problems. [facility] Course: Colin [name] was admitted to the hospital with the working diagnosis of heart failure decompensation.  71 yo male with the past medical history of coronary artery disease, T2DM, dyslipidemia and heart failure, who presented with dyspnea. Apparently patient had worsening dyspnea for 3 days, associated with worsening edema that prompted him to come to the ED. On his initial physical examination his blood pressure was 159/66, HR 94, RR 21 and 02 saturation 98%, lungs with wheezing and rales with no rhonchi, heart with S1 and S2 present and rhythmic with no gallops, abdomen with no distention, positive lower extremity edema.   Na 137, K 4,0 CL 105 bicarbonate 22, glucose 359 bun 29 cr 2,0  BNP 2,479 High sensitive troponin 191, 228  Wbc 11.1 hgb 13,7 plt 230  Sars covid 19  negative Urine analysis SG 1.032, > 300 protein, > 500 glucose, negative leukocytes.   Chest radiograph with no cardiomegaly, bilateral hilar vascular congestion, no effusions.  EKG 93 bpm, normal axis, normal intervals, sinus rhythm with PVC, J point elevation in V1 and V3, ST depression in V6, T wave inversion in lead II, III, AvF, V5 and V6 (chronic changes).   Patient was placed on furosemide for diuresis. Echocardiogram with reduction in systolic function.   11/17 renal function improving, patient very weak and deconditioned.  11/18 his volume status has improved, limited pharmacologic options due to reduced GFR Patient will need close follow up as outpatient,   Assessment and Plan: * Acute on chronic systolic CHF (congestive heart failure) (HCC) Echocardiogram with reduced LV systolic function with EF 30 to 35%, with global hypokinesis. RV systolic function with mild reduction. LA with moderate dilatation. Moderate to severe MR, RVSP 26,4   11/14 cardiac catheterization  RA 5 RV 45/0 PA 51/11, mean 27  PCW 12 Co 3,8 and indecx 2,2   Patient was placed on IV furosemide for diuresis, negative fluid balance was achieved, -6.225 ml, with 7 Kg weight loss, with significant improvement in his symptoms.   Plan to continue after load reduction with hydralazine and isosorbide Continue with ivabradine.  Hold on bisoprolol due to risk of worsening bradycardia Continue with loop diuretic therapy furosemide 60 mg daily. Holding on SGLT 2 inh and ARB due to risk of worsening renal function and hypotension.    Acute hypoxemic respiratory failure due to acute  cardiogenic pulmonary edema.  His dyspnea and oxygenation have improved, at the time of his discharge his 02 saturation is 97% on room air.    AKI (acute kidney injury) ([place]) CKD stage 3a   At the time of his discharge his renal function is stable at 3,4 with K at 4,5 and serum bicarbonate at 25. Plan to continue diuresis with  furosemide, holding on ARB and SGLT 2 inh at this time.   Hypoalbuminemia.   Coronary artery disease No signs of acute coronary syndrome Continue with antiplatelet therapy and statin   Essential hypertension Continue blood pressure control with Bidil Continue diuresis with furosemide   Type 2 diabetes mellitus with hyperlipidemia (HCC) Uncontrolled with hyperglycemia  Fasting glucose this am is 316. Capillary 242 and 115  Patient now off systemic corticosteroids, continue with current insulin therapy.   COPD with acute exacerbation [facility]) Patient with improvement in dyspnea.  Pneumonia has been ruled out. Reactive leukocytosis is improving, at the time of his discharge is 14,7   Continue bronchodilator therapy.          Consultants: cardiology  Procedures performed: cardiac catheterization  Disposition: Home Diet recommendation:  Cardiac and Carb modified diet DISCHARGE MEDICATION: Allergies as of 12/23/2021   No Known Allergies      Medication List     STOP taking these medications    Accu-Chek Guide test strip Generic drug: glucose blood   Accu-Chek Guide w/Device Kit   Accu-Chek Softclix Lancets lancets   bisoprolol 5 MG tablet Commonly known as: ZEBETA   FreeStyle Libre 2 Sensor Misc   gabapentin 100 MG capsule Commonly known as: NEURONTIN   ibuprofen 100 MG/5ML suspension Commonly known as: ADVIL   Jardiance 25 MG Tabs tablet Generic drug: empagliflozin   losartan 100 MG tablet Commonly known as: COZAAR   midodrine 2.5 MG tablet Commonly known as: PROAMATINE   spironolactone 25 MG tablet Commonly known as: ALDACTONE   TechLite Pen Needles 32G X 4 MM Misc Generic drug: Insulin Pen Needle       TAKE these medications    clopidogrel 75 MG tablet Commonly known as: Plavix Take 1 tablet (75 mg total) by mouth daily. What changed: Another medication with the same name was removed. Continue taking this medication, and follow the  directions you see here.   furosemide 20 MG tablet Commonly known as: LASIX Take 3 tablets (60 mg total) by mouth daily. What changed:  medication strength how much to take   isosorbide-hydrALAZINE 20-37.5 MG tablet Commonly known as: BIDIL Take 0.5 tablets by mouth 3 (three) times daily.   ivabradine 5 MG Tabs tablet Commonly known as: CORLANOR Take 1 tablet (5 mg total) by mouth 2 (two) times daily with a meal.   nitroGLYCERIN 0.4 MG SL tablet Commonly known as: NITROSTAT Place 1 tab (0.27m total) under the tongue every 5 minutes as needed for chest pain What changed: Another medication with the same name was removed. Continue taking this medication, and follow the directions you see here.   NovoLIN 70/30 Kwikpen (70-30) 100 UNIT/ML KwikPen Generic drug: insulin isophane & regular human KwikPen Inject 30 Units into the skin 2 (two) times daily before a meal. What changed:  how much to take when to take this   pantoprazole 20 MG tablet Commonly known as: PROTONIX Take 1 tablet (20 mg total) by mouth daily. What changed: Another medication with the same name was removed. Continue taking this medication, and follow the directions you see here.  rosuvastatin 5 MG tablet Commonly known as: CRESTOR Take 1 tablet (5 mg total) by mouth daily. What changed:  medication strength how much to take when to take this        [place], [place] [facility] Follow up.   Specialty: [place] Why: Agency will contact with apt times Contact information: [place] [address] [phone]                Discharge Exam: Filed Weights   12/21/21 0400 12/22/21 0346 12/23/21 0500  Weight: 63 kg 60.1 kg 56.8 kg   BP (!) 133/50 (BP Location: Left Arm)   Pulse (!) 55   Temp 97.7 F (36.5 C) (Oral)   Resp 16   Ht _0  (1.626 m)   Wt 56.8 kg   SpO2 94%   BMI 21.49 kg/m   Patient with improvement  in dyspnea and edema, no chest pain   Neurology awake and alert ENT mild pallor Cardiovascular with S1 and S2 present and rhythmic, with no gallops, rubs or murmurs Respiratory with mild rales at bases with no wheezing or rhonchi Abdomen with no distention  No lower extremity edema   Condition at discharge: stable  The results of significant diagnostics from this hospitalization (including imaging, microbiology, ancillary and laboratory) are listed below for reference.   Imaging Studies: DG Chest 1 View  Result Date: 12/22/2021 CLINICAL DATA:  Chest pain. Follow-up exam. EXAM: CHEST  1 VIEW COMPARISON:  Radiograph 12/19/2021 FINDINGS: Stable cardiomegaly post CABG. Slight worsening of interstitial opacity in the right suprahilar lung. Background increased interstitial markings are otherwise unchanged. No pleural fluid or pneumothorax. IMPRESSION: 1. Slight worsening of interstitial opacity in the right suprahilar lung, may represent asymmetric pulmonary edema or infection. 2. Stable cardiomegaly post CABG. Electronically Signed   By: Keith Rake M.D.   On: 12/22/2021 15:13   US RENAL  Result Date: 12/20/2021 CLINICAL DATA:  Acute kidney injury. EXAM: RENAL / URINARY TRACT ULTRASOUND COMPLETE COMPARISON:  None Available. FINDINGS: Right Kidney: Renal measurements: 9.5 x 5.0 x 5.0 cm = volume: 123 mL. Increased echogenicity of renal parenchyma is noted suggesting medical renal disease. No mass or hydronephrosis visualized. Left Kidney: Renal measurements: 9.9 x 5.2 x 4.9 cm = volume: 130 mL. Increased echogenicity of renal parenchyma is noted suggesting medical renal disease. Two simple cysts are noted, with the largest measuring 4.7 cm. No mass or hydronephrosis visualized. Bladder: Appears normal for degree of bladder distention. Bilateral ureteral jets are noted. Other: None. IMPRESSION: Increased echogenicity of renal parenchyma is noted bilaterally suggesting medical renal disease. No  hydronephrosis or renal obstruction is noted. Electronically Signed   By: Marijo Conception M.D.   On: 12/20/2021 13:44   CARDIAC CATHETERIZATION  Result Date: 12/19/2021 Images from the original result were not included. RA: 5 mmHg RV: 45/0 mmHg PA: 51/11 mmHg, mPAP 27 mmHg PCW: 12 mmHg CO: 3.8 L/min CI: 2.2 L/min/m2 Please note that patient was on HFNC during the procedure owing to hypoxia. Fairly compensated ischemic cardiomyopathy Mild PH, WHO Grp II While he does have underlying cardiomyopathy, acute decompensation unlikely to be purely cardiogenic in nature Recommend holding diuretics in light of rising creatinine and normal filling pressures. Nigel Mormon, MD Pager: [phone] Office: [phone]  DG CHEST PORT 1 VIEW  Result Date: 12/19/2021 CLINICAL DATA:  Shortness of breath, hypoxia EXAM: PORTABLE CHEST 1 VIEW COMPARISON:  Previous  studies including the examination of 12/16/2021 FINDINGS: Transverse diameter of heart is increased. There is previous coronary bypass surgery. Central pulmonary vessels are prominent. There is interval increase in interstitial markings in both parahilar regions and lower lung fields. There is no focal consolidation. There is no significant pleural effusion or pneumothorax. IMPRESSION: Cardiomegaly. There is interval increase in interstitial markings in both lungs suggesting possible worsening of interstitial pulmonary edema or interstitial pneumonia. Electronically Signed   By: Elmer Picker M.D.   On: 12/19/2021 08:57   ECHOCARDIOGRAM COMPLETE  Result Date: 12/17/2021    ECHOCARDIOGRAM REPORT   Patient Name:   [facility] Date of Exam: 12/17/2021 Medical Rec #:  [id number]    Height:       64.0 in Accession #:    [id number]   Weight:       134.0 lb Date of Birth:  December 22, 1950    BSA:          1.650 m Patient Age:    71 years     BP:           119/68 mmHg Patient Gender: M            HR:           74 bpm. Exam Location:  Inpatient Procedure: 2D  Echo, Color Doppler and Cardiac Doppler Indications:    N47.09 Acute systolic (congestive) heart failure  History:        Patient has prior history of Echocardiogram examinations, most                 recent 08/23/2020. CHF, Prior CABG; Risk Factors:Hypertension,                 Diabetes and Dyslipidemia.  Sonographer:    Raquel Sarna Senior RDCS Referring Phys: [id number] [place]  1. Left ventricular ejection fraction, by estimation, is 30 to 35%. Left ventricular ejection fraction by 2D MOD biplane is 31.3 %. The left ventricle has moderately decreased function. The left ventricle demonstrates global hypokinesis. Left ventricular diastolic parameters are consistent with Grade III diastolic dysfunction (restrictive). Elevated left ventricular end-diastolic pressure.  2. Right ventricular systolic function is mildly reduced. The right ventricular size is normal. There is normal pulmonary artery systolic pressure. The estimated right ventricular systolic pressure is 94.7 mmHg.  3. Left atrial size was moderately dilated.  4. The mitral valve is abnormal. Moderate to severe mitral valve regurgitation.  5. The aortic valve is tricuspid. Aortic valve regurgitation is not visualized. Aortic valve sclerosis/calcification is present, without any evidence of aortic stenosis.  6. The inferior vena cava is normal in size with greater than 50% respiratory variability, suggesting right atrial pressure of 3 mmHg.  7. Evidence of atrial level shunting detected by color flow Doppler. There is a small patent foramen ovale with predominantly left to right shunting across the atrial septum. Comparison(s): Changes from prior study are noted. 08/23/2020: LVEF 20-30%. FINDINGS  Left Ventricle: Left ventricular ejection fraction, by estimation, is 30 to 35%. Left ventricular ejection fraction by 2D MOD biplane is 31.3 %. The left ventricle has moderately decreased function. The left ventricle demonstrates global hypokinesis.  The left ventricular internal cavity size was normal in size. There is no left ventricular hypertrophy. Left ventricular diastolic parameters are consistent with Grade III diastolic dysfunction (restrictive). Elevated left ventricular end-diastolic pressure. Right Ventricle: The right ventricular size is normal. No increase in right ventricular wall thickness. Right ventricular systolic function is mildly  reduced. There is normal pulmonary artery systolic pressure. The tricuspid regurgitant velocity is 2.42 m/s, and with an assumed right atrial pressure of 3 mmHg, the estimated right ventricular systolic pressure is 36.1 mmHg. Left Atrium: Left atrial size was moderately dilated. Right Atrium: Right atrial size was normal in size. Pericardium: There is no evidence of pericardial effusion. Mitral Valve: The mitral valve is abnormal. There is mild thickening of the anterior and posterior mitral valve leaflet(s). There is mild calcification of the mitral valve leaflet(s). Moderate to severe mitral valve regurgitation, with posteriorly-directed jet. Tricuspid Valve: The tricuspid valve is grossly normal. Tricuspid valve regurgitation is trivial. Aortic Valve: The aortic valve is tricuspid. Aortic valve regurgitation is not visualized. Aortic valve sclerosis/calcification is present, without any evidence of aortic stenosis. Pulmonic Valve: The pulmonic valve was grossly normal. Pulmonic valve regurgitation is trivial. Aorta: The aortic root and ascending aorta are structurally normal, with no evidence of dilitation. Venous: The inferior vena cava is normal in size with greater than 50% respiratory variability, suggesting right atrial pressure of 3 mmHg. IAS/Shunts: Evidence of atrial level shunting detected by color flow Doppler. A small patent foramen ovale is detected with predominantly left to right shunting across the atrial septum.  LEFT VENTRICLE PLAX 2D                        Biplane EF (MOD) LVIDd:         5.10  cm         LV Biplane EF:   Left LVIDs:         4.30 cm                          ventricular LV PW:         1.00 cm                          ejection LV IVS:        0.80 cm                          fraction by LVOT diam:     1.80 cm                          2D MOD LV SV:         40                               biplane is LV SV Index:   25                               31.3 %. LVOT Area:     2.54 cm                                Diastology                                LV e' medial:    4.68 cm/s LV Volumes (MOD)               LV E/e' medial:  30.6 LV vol  d, MOD    173.0 ml      LV e' lateral:   7.62 cm/s A2C:                           LV E/e' lateral: 18.8 LV vol d, MOD    120.0 ml A4C: LV vol s, MOD    115.0 ml A2C: LV vol s, MOD    83.6 ml A4C: LV SV MOD A2C:   58.0 ml LV SV MOD A4C:   120.0 ml LV SV MOD BP:    46.2 ml RIGHT VENTRICLE RV S prime:     8.92 cm/s TAPSE (M-mode): 1.5 cm LEFT ATRIUM             Index        RIGHT ATRIUM           Index LA diam:        4.60 cm 2.79 cm/m   RA Area:     12.20 cm LA Vol (A2C):   80.4 ml 48.72 ml/m  RA Volume:   27.50 ml  16.66 ml/m LA Vol (A4C):   63.0 ml 38.18 ml/m LA Biplane Vol: 70.8 ml 42.90 ml/m  AORTIC VALVE LVOT Vmax:   98.90 cm/s LVOT Vmean:  67.800 cm/s LVOT VTI:    0.159 m  AORTA Ao Root diam: 2.90 cm Ao Asc diam:  3.30 cm MITRAL VALVE                  TRICUSPID VALVE MV Area (PHT): 4.89 cm       TR Peak grad:   23.4 mmHg MV Decel Time: 155 msec       TR Vmax:        242.00 cm/s MR Peak grad:    98.8 mmHg MR Mean grad:    63.0 mmHg    SHUNTS MR Vmax:         497.00 cm/s  Systemic VTI:  0.16 m MR Vmean:        376.0 cm/s   Systemic Diam: 1.80 cm MR PISA:         2.26 cm MR PISA Eff ROA: 18 mm MR PISA Radius:  0.60 cm MV E velocity: 143.00 cm/s MV A velocity: 30.40 cm/s MV E/A ratio:  4.70 Lyman Bishop MD Electronically signed by Lyman Bishop MD Signature Date/Time: 12/17/2021/3:28:21 PM    Final    US Abdomen Limited RUQ (LIVER/GB)  Result Date:  12/17/2021 CLINICAL DATA:  71 year old male with history of abnormal liver function tests. EXAM: ULTRASOUND ABDOMEN LIMITED RIGHT UPPER QUADRANT COMPARISON:  No priors. FINDINGS: Gallbladder: No gallstones. Gallbladder is nearly completely decompressed. Gallbladder wall measures slightly thickened (4 mm), likely secondary to underdistention of the bladder. No pericholecystic fluid. Per report from the sonographer, there was no sonographic Murphy's sign on examination. Common bile duct: Diameter: 2 mm Liver: No focal lesion identified. Within normal limits in parenchymal echogenicity. Portal vein is patent on color Doppler imaging with normal direction of blood flow towards the liver. Other: None. IMPRESSION: 1. No evidence to suggest biliary tract obstruction. 2. Gallbladder wall appears mildly thickened, likely secondary to under distension of the gallbladder. No gallstones or other definitive findings to suggest acute cholecystitis at this time. Electronically Signed   By: Vinnie Langton M.D.   On: 12/17/2021 09:29   DG Chest Portable 1 View  Result Date: 12/16/2021 CLINICAL DATA:  Respiratory distress, chest pain EXAM: PORTABLE  CHEST 1 VIEW COMPARISON:  10/14/2020 FINDINGS: Cardiomegaly status post median sternotomy. Mild, diffuse bilateral interstitial pulmonary opacity. The visualized skeletal structures are unremarkable. IMPRESSION: Cardiomegaly with mild, diffuse bilateral interstitial pulmonary opacity, likely edema. No focal airspace opacity. Electronically Signed   By: Delanna Ahmadi M.D.   On: 12/16/2021 14:26    Microbiology: Results for orders placed or performed during the hospital encounter of 12/16/21  Resp Panel by RT-PCR (Flu A&B, Covid) Anterior Nasal Swab     Status: None   Collection Time: 12/16/21 12:49 PM   Specimen: Anterior Nasal Swab  Result Value Ref Range Status   SARS Coronavirus 2 by RT PCR NEGATIVE NEGATIVE Final    Comment: (NOTE) SARS-CoV-2 target nucleic acids are  NOT DETECTED.  The SARS-CoV-2 RNA is generally detectable in upper respiratory specimens during the acute phase of infection. The lowest concentration of SARS-CoV-2 viral copies this assay can detect is 138 copies/mL. A negative result does not preclude SARS-Cov-2 infection and should not be used as the sole basis for treatment or other patient management decisions. A negative result may occur with  improper specimen collection/handling, submission of specimen other than nasopharyngeal swab, presence of viral mutation(s) within the areas targeted by this assay, and inadequate number of viral copies(<138 copies/mL). A negative result must be combined with clinical observations, patient history, and epidemiological information. The expected result is Negative.  Fact Sheet for Patients:  EntrepreneurPulse.com.au  Fact Sheet for Healthcare Providers:  IncredibleEmployment.be  This test is no t yet approved or cleared by the [place] FDA and  has been authorized for detection and/or diagnosis of SARS-CoV-2 by FDA under an Emergency Use Authorization (EUA). This EUA will remain  in effect (meaning this test can be used) for the duration of the COVID-19 declaration under Section 564(b)(1) of the Act, 21 U.S.C.section 360bbb-3(b)(1), unless the authorization is terminated  or revoked sooner.       Influenza A by PCR NEGATIVE NEGATIVE Final   Influenza B by PCR NEGATIVE NEGATIVE Final    Comment: (NOTE) The Xpert Xpress SARS-CoV-2/FLU/RSV plus assay is intended as an aid in the diagnosis of influenza from Nasopharyngeal swab specimens and should not be used as a sole basis for treatment. Nasal washings and aspirates are unacceptable for Xpert Xpress SARS-CoV-2/FLU/RSV testing.  Fact Sheet for Patients: EntrepreneurPulse.com.au  Fact Sheet for Healthcare Providers: IncredibleEmployment.be  This test is not  yet approved or cleared by the [place] FDA and has been authorized for detection and/or diagnosis of SARS-CoV-2 by FDA under an Emergency Use Authorization (EUA). This EUA will remain in effect (meaning this test can be used) for the duration of the COVID-19 declaration under Section 564(b)(1) of the Act, 21 U.S.C. section 360bbb-3(b)(1), unless the authorization is terminated or revoked.  Performed at [facility], [place] [address], [place], [place] [address]     Labs: CBC: Recent Labs  Lab 12/16/21 1257 12/17/21 0024 12/18/21 0029 12/19/21 6045 12/19/21 1209 12/21/21 0038 12/22/21 0119 12/23/21 0046  WBC 11.1*   < > 11.9* 15.7*  --  19.7* 18.9* 14.7*  NEUTROABS 9.2*  --   --   --   --   --   --   --   HGB 13.7   < > 13.5 12.6* 10.9*  11.2* 11.2* 11.0* 12.2*  HCT 41.8   < > 40.3 37.8* 32.0*  33.0* 32.9* 32.8* 36.1*  MCV 92.3   < > 91.0 92.0  --  90.9 91.6 92.8  PLT 230   < > 165 268  --  273 287 296   < > = values in this interval not displayed.   Basic Metabolic Panel: Recent Labs  Lab 12/16/21 1257 12/17/21 0024 12/19/21 0619 12/19/21 1209 12/20/21 0059 12/21/21 0038 12/22/21 0119 12/23/21 0046  NA 137   < > 140 138  138 138 141 142 143  K 4.0   < > 4.8 4.5  4.5 4.7 3.9 3.8 4.5  CL 105   < > 106  --  103 107 108 108  CO2 22   < > 25  --  24 20* 21* 25  GLUCOSE 369*   < > 138*  --  340* 256* 316* 282*  BUN 29*   < > 49*  --  65* 78* 70* 63*  CREATININE 2.06*   < > 2.82*  --  3.10* 3.82* 3.41* 3.44*  CALCIUM 8.3*   < > 8.3*  --  8.0* 8.1* 8.2* 8.3*  MG 1.9  --   --   --   --   --   --   --    < > = values in this interval not displayed.   Liver Function Tests: Recent Labs  Lab 12/17/21 0024 12/18/21 0029 12/21/21 0038  AST 23 36 55*  ALT _0 ALKPHOS 63 61 117  BILITOT 0.6 0.2* 0.4  PROT 5.3* 5.5* 5.3*  ALBUMIN <1.5* <1.5* <1.5*   CBG: Recent Labs  Lab 12/22/21 0602 12/22/21 1207 12/22/21 1639 12/22/21 2153 12/23/21 0640   GLUCAP 242* 115* 222* 234* 299*    Discharge time spent: greater than 30 minutes.  Signed: Tawni Millers, MD [facility] 12/23/2021

## 2021-12-24 DIAGNOSIS — I2510 Atherosclerotic heart disease of native coronary artery without angina pectoris: Secondary | ICD-10-CM | POA: Diagnosis not present

## 2021-12-24 DIAGNOSIS — E1169 Type 2 diabetes mellitus with other specified complication: Secondary | ICD-10-CM | POA: Diagnosis not present

## 2021-12-24 DIAGNOSIS — N179 Acute kidney failure, unspecified: Secondary | ICD-10-CM | POA: Diagnosis not present

## 2021-12-24 DIAGNOSIS — I5023 Acute on chronic systolic (congestive) heart failure: Secondary | ICD-10-CM | POA: Diagnosis not present

## 2021-12-24 LAB — GLUCOSE, CAPILLARY
Glucose-Capillary: 283 mg/dL — ABNORMAL HIGH (ref 70–99)
Glucose-Capillary: 200 mg/dL — ABNORMAL HIGH (ref 70–99)

## 2021-12-24 NOTE — TOC Transition Note (Signed)
Transition of Care [facility]) - CM/SW Discharge Note   Patient Details  Name: Colin [name] MRN: [id number] Date of Birth: August 13, 1950  Transition of Care [facility]) CM/SW Contact:  Lawerance Sabal, RN Phone Number: 12/24/2021, 11:12 AM   Clinical Narrative:     Pedro Earls MD in hallway, patient will DC today with [facility] services and home oxygen. Spoke w son Colin [name] to set up and confirm resources for home. [employer] will deliver oxygen to the room for DC, [name] agreed to contact person for [employer], liaison provided with his phone number.  [facility] notified of DC today. Family to provide transportation home  Final next level of care: Home w Home Health Services Barriers to Discharge: No Barriers Identified   Patient Goals and CMS Choice Patient states their goals for this hospitalization and ongoing recovery are:: return home CMS Medicare.gov Compare Post Acute Care list provided to:: Patient Represenative (must comment) Choice offered to / list presented to : Patient, Adult Children  Discharge Placement                       Discharge Plan and Services In-house Referral: NA Discharge Planning Services: CM Consult Post Acute Care Choice: Home Health          DME Arranged: Oxygen DME Agency: [employer] Date DME Agency Contacted: 12/24/21 Time DME Agency Contacted: 1109 Representative spoke with at DME Agency: Vaughan Basta HH Arranged: PT HH Agency: [facility] Date [facility] Agency Contacted: 12/24/21 Time HH Agency Contacted: 1111 Representative spoke with at [facility] Agency: [name]  Social Determinants of Health (SDOH) Interventions     Readmission Risk Interventions     No data to display

## 2021-12-24 NOTE — Progress Notes (Signed)
Yesterday his discharge was cancelled because his family was concerned about his mobility. He has been stable with improving dyspnea and edema, no orthopnea or PND  BP (!) 116/41 (BP Location: Left Arm)   Pulse 66   Temp 98.3 F (36.8 C) (Oral)   Resp 16   Ht 5\' 4"  (1.626 m)   Wt 56.2 kg   SpO2 97%   BMI 21.27 kg/m   Neurology awake and alert ENT with mild pallor Cardiovascular with S1 and S2 present with no gallops, rubs or murmurs Respiratory with no rales or wheezing, no rhonchi Abdomen with no distention  Trace lower extremity edema  Heart failure  Plan to check ambulatory oxymetry on room air on ambulation If continue to be stable discharge him after lunch. I have talked to his son over the phone and all questions were addressed.

## 2021-12-24 NOTE — Plan of Care (Signed)
  Problem: Education: Goal: Ability to demonstrate management of disease process will improve Outcome: Adequate for Discharge Goal: Ability to verbalize understanding of medication therapies will improve Outcome: Adequate for Discharge Goal: Individualized Educational Video(s) Outcome: Adequate for Discharge   Problem: Activity: Goal: Capacity to carry out activities will improve Outcome: Adequate for Discharge   Problem: Cardiac: Goal: Ability to achieve and maintain adequate cardiopulmonary perfusion will improve Outcome: Adequate for Discharge   Problem: Education: Goal: Ability to describe self-care measures that may prevent or decrease complications (Diabetes Survival Skills Education) will improve Outcome: Adequate for Discharge Goal: Individualized Educational Video(s) Outcome: Adequate for Discharge   Problem: Coping: Goal: Ability to adjust to condition or change in health will improve Outcome: Adequate for Discharge   Problem: Fluid Volume: Goal: Ability to maintain a balanced intake and output will improve Outcome: Adequate for Discharge   Problem: Health Behavior/Discharge Planning: Goal: Ability to identify and utilize available resources and services will improve Outcome: Adequate for Discharge Goal: Ability to manage health-related needs will improve Outcome: Adequate for Discharge   Problem: Metabolic: Goal: Ability to maintain appropriate glucose levels will improve Outcome: Adequate for Discharge   Problem: Nutritional: Goal: Maintenance of adequate nutrition will improve Outcome: Adequate for Discharge Goal: Progress toward achieving an optimal weight will improve Outcome: Adequate for Discharge   Problem: Skin Integrity: Goal: Risk for impaired skin integrity will decrease Outcome: Adequate for Discharge   Problem: Tissue Perfusion: Goal: Adequacy of tissue perfusion will improve Outcome: Adequate for Discharge   Problem: Education: Goal:  Knowledge of General Education information will improve Description: Including pain rating scale, medication(s)/side effects and non-pharmacologic comfort measures Outcome: Adequate for Discharge   Problem: Health Behavior/Discharge Planning: Goal: Ability to manage health-related needs will improve Outcome: Adequate for Discharge   Problem: Clinical Measurements: Goal: Ability to maintain clinical measurements within normal limits will improve Outcome: Adequate for Discharge Goal: Will remain free from infection Outcome: Adequate for Discharge Goal: Diagnostic test results will improve Outcome: Adequate for Discharge Goal: Respiratory complications will improve Outcome: Adequate for Discharge Goal: Cardiovascular complication will be avoided Outcome: Adequate for Discharge   Problem: Activity: Goal: Risk for activity intolerance will decrease Outcome: Adequate for Discharge   Problem: Nutrition: Goal: Adequate nutrition will be maintained Outcome: Adequate for Discharge   Problem: Coping: Goal: Level of anxiety will decrease Outcome: Adequate for Discharge   Problem: Elimination: Goal: Will not experience complications related to bowel motility Outcome: Adequate for Discharge Goal: Will not experience complications related to urinary retention Outcome: Adequate for Discharge   Problem: Pain Managment: Goal: General experience of comfort will improve Outcome: Adequate for Discharge   Problem: Safety: Goal: Ability to remain free from injury will improve Outcome: Adequate for Discharge   Problem: Skin Integrity: Goal: Risk for impaired skin integrity will decrease Outcome: Adequate for Discharge   Problem: Education: Goal: Understanding of CV disease, CV risk reduction, and recovery process will improve Outcome: Adequate for Discharge Goal: Individualized Educational Video(s) Outcome: Adequate for Discharge   Problem: Activity: Goal: Ability to return to  baseline activity level will improve Outcome: Adequate for Discharge   Problem: Cardiovascular: Goal: Ability to achieve and maintain adequate cardiovascular perfusion will improve Outcome: Adequate for Discharge Goal: Vascular access site(s) Level 0-1 will be maintained Outcome: Adequate for Discharge   Problem: Health Behavior/Discharge Planning: Goal: Ability to safely manage health-related needs after discharge will improve Outcome: Adequate for Discharge   

## 2021-12-24 NOTE — Progress Notes (Signed)
SATURATION QUALIFICATIONS: (This note is used to comply with regulatory documentation for home oxygen)  Patient Saturations on Room Air at Rest = 100%  Patient Saturations on Room Air while Ambulating = 77%  Patient Saturations on 2 Liters of oxygen while Ambulating = 98%  Please briefly explain why patient needs home oxygen:

## 2021-12-24 NOTE — Progress Notes (Signed)
Patient is not eating meals and his glucose remains elevated consistently.    PT needed 2 Liters O2 placement during the night for O2 levels at 84%-85% while sleeping.

## 2021-12-24 NOTE — Discharge Instructions (Signed)
Recommendations at discharge:     Patient will continue diuresis with furosemide 60 mg daily Limited heart failure guideline therapy due to low GFR, continue afterload reduction with bidil Holding on B  blocker due to risk of bradycardia Follow up renal function and electrolytes in 7 days Follow up with Gwinda Passe NP in 7 to 10 days Follow up with Cardiology as scheduled.

## 2021-12-25 ENCOUNTER — Other Ambulatory Visit: Payer: Self-pay

## 2021-12-25 ENCOUNTER — Telehealth: Payer: Self-pay

## 2021-12-25 NOTE — Patient Outreach (Signed)
  Care Coordination TOC Note Transition Care Management Unsuccessful Follow-up Telephone Call  Date of discharge and from where:  12/24/21-[place]   Attempts:  1st Attempt  Reason for unsuccessful TCM follow-up call:  No answer/busy   Antionette Fairy, RN,BSN,CCM [facility] Care Management Telephonic Care Management Coordinator Direct Phone: [phone] Toll Free: [phone] Fax: [phone]

## 2021-12-25 NOTE — Patient Outreach (Signed)
  Care Coordination TOC Note Transition Care Management Unsuccessful Follow-up Telephone Call  Date of discharge and from where:  12/24/21-[place]  Attempts:  2nd Attempt  Reason for unsuccessful TCM follow-up call:  Unable to reach patient    Alessandra Grout [facility] Care Management Telephonic Care Management Coordinator Direct Phone: [phone] Toll Free: [phone] Fax: [phone]

## 2021-12-26 ENCOUNTER — Telehealth: Payer: Self-pay

## 2021-12-26 NOTE — Patient Outreach (Signed)
  Care Coordination TOC Note Transition Care Management Unsuccessful Follow-up Telephone Call  Date of discharge and from where:  12/24/21-[place]  Attempts:  3rd Attempt  Reason for unsuccessful TCM follow-up call:  Unable to reach patient   Alessandra Grout [facility] Care Management Telephonic Care Management Coordinator Direct Phone: [phone] Toll Free: [phone] Fax: [phone]

## 2022-01-01 ENCOUNTER — Ambulatory Visit: Payer: Medicare HMO

## 2022-01-05 ENCOUNTER — Telehealth (INDEPENDENT_AMBULATORY_CARE_PROVIDER_SITE_OTHER): Payer: Self-pay

## 2022-01-05 DIAGNOSIS — I2510 Atherosclerotic heart disease of native coronary artery without angina pectoris: Secondary | ICD-10-CM

## 2022-01-05 NOTE — Telephone Encounter (Signed)
Received call from [facility] from [facility]. Melissa advised that pt needs medication reconciliation for, discharge. Melissa stated she asked pt's son to call and make hospital f/u appt.  The pt needs refills of the nitroglycerin and pantoprazole.  The pt needs ivabramine. The discharge med list left of Stelara, Gabapentin, Losartan.   Pt's weight 135 lbs and O2 sats on room air was 96-98%- Does have O2 at home.

## 2022-01-05 NOTE — Telephone Encounter (Signed)
Michelle hasn't seen pt in over a year. Last appt was 05/2020. Pt will need to have a hospital f/u. Wouldn't be able to refill medication

## 2022-01-10 ENCOUNTER — Other Ambulatory Visit (INDEPENDENT_AMBULATORY_CARE_PROVIDER_SITE_OTHER): Payer: Self-pay | Admitting: Primary Care

## 2022-01-10 ENCOUNTER — Other Ambulatory Visit: Payer: Self-pay

## 2022-01-11 ENCOUNTER — Other Ambulatory Visit (INDEPENDENT_AMBULATORY_CARE_PROVIDER_SITE_OTHER): Payer: Self-pay | Admitting: Primary Care

## 2022-01-11 ENCOUNTER — Encounter (INDEPENDENT_AMBULATORY_CARE_PROVIDER_SITE_OTHER): Payer: Self-pay | Admitting: Primary Care

## 2022-01-11 ENCOUNTER — Ambulatory Visit (INDEPENDENT_AMBULATORY_CARE_PROVIDER_SITE_OTHER): Payer: Medicare Other | Admitting: Primary Care

## 2022-01-11 ENCOUNTER — Other Ambulatory Visit: Payer: Self-pay

## 2022-01-11 VITALS — BP 196/91 | HR 79 | Ht 63.5 in | Wt 125.0 lb

## 2022-01-11 DIAGNOSIS — N179 Acute kidney failure, unspecified: Secondary | ICD-10-CM | POA: Diagnosis not present

## 2022-01-11 DIAGNOSIS — K219 Gastro-esophageal reflux disease without esophagitis: Secondary | ICD-10-CM | POA: Diagnosis not present

## 2022-01-11 DIAGNOSIS — Z09 Encounter for follow-up examination after completed treatment for conditions other than malignant neoplasm: Secondary | ICD-10-CM

## 2022-01-11 DIAGNOSIS — I10 Essential (primary) hypertension: Secondary | ICD-10-CM

## 2022-01-11 MED ORDER — ISOSORB DINITRATE-HYDRALAZINE 20-37.5 MG PO TABS
1.0000 | ORAL_TABLET | Freq: Three times a day (TID) | ORAL | 0 refills | Status: DC
  Filled 2022-01-11 (×2): qty 90, 30d supply, fill #0

## 2022-01-11 MED ORDER — METOPROLOL SUCCINATE ER 50 MG PO TB24
50.0000 mg | ORAL_TABLET | Freq: Every day | ORAL | 0 refills | Status: DC
  Filled 2022-01-11: qty 30, 30d supply, fill #0

## 2022-01-11 NOTE — Telephone Encounter (Signed)
Will forward to provider  

## 2022-01-11 NOTE — Progress Notes (Signed)
No question or concerns.

## 2022-01-11 NOTE — Telephone Encounter (Signed)
Requested medication (s) are due for refill today: Yes  Requested medication (s) are on the active medication list: Yes  Last refill:  12/23/21  Future visit scheduled: Yes  Notes to clinic:  Last ordered by Dr. Ella Jubilee.    Requested Prescriptions  Pending Prescriptions Disp Refills   furosemide (LASIX) 20 MG tablet 30 tablet 0    Sig: Take 3 tablets (60 mg total) by mouth daily.     Cardiovascular:  Diuretics - Loop Failed - 01/10/2022  4:50 PM      Failed - Ca in normal range and within 180 days    Calcium  Date Value Ref Range Status  12/23/2021 8.3 (L) 8.9 - 10.3 mg/dL Final   Calcium, Ion  Date Value Ref Range Status  12/19/2021 1.14 (L) 1.15 - 1.40 mmol/L Final  12/19/2021 1.14 (L) 1.15 - 1.40 mmol/L Final         Failed - Cr in normal range and within 180 days    Creatinine, Ser  Date Value Ref Range Status  12/23/2021 3.44 (H) 0.61 - 1.24 mg/dL Final   Creatinine,U  Date Value Ref Range Status  06/29/2021 59.3 mg/dL Final         Failed - Last BP in normal range    BP Readings from Last 1 Encounters:  12/24/21 (!) 159/70         Failed - Valid encounter within last 6 months    Recent Outpatient Visits           1 year ago Type 2 diabetes mellitus with diabetic polyneuropathy, with long-term current use of insulin (HCC)   [facility] Grayce Sessions, NP   1 year ago Type 2 diabetes mellitus with diabetic polyneuropathy, with long-term current use of insulin (HCC)   [facility] Grayce Sessions, NP   1 year ago Type 2 diabetes mellitus without complication, with long-term current use of insulin (HCC)   [facility] [place], Cornelius Moras, RPH-CPP   1 year ago Type 2 diabetes mellitus without complication, with long-term current use of insulin (HCC)   [facility] Grayce Sessions, NP   2 years ago Essential hypertension   [facility] Grayce Sessions, NP       Future Appointments             Today Grayce Sessions, NP [facility]            Passed - K in normal range and within 180 days    Potassium  Date Value Ref Range Status  12/23/2021 4.5 3.5 - 5.1 mmol/L Final         Passed - Na in normal range and within 180 days    Sodium  Date Value Ref Range Status  12/23/2021 143 135 - 145 mmol/L Final  11/11/2020 143 134 - 144 mmol/L Final         Passed - Cl in normal range and within 180 days    Chloride  Date Value Ref Range Status  12/23/2021 108 98 - 111 mmol/L Final         Passed - Mg Level in normal range and within 180 days    Magnesium  Date Value Ref Range Status  12/16/2021 1.9 1.7 - 2.4 mg/dL Final    Comment:    Performed at [facility], [address] [place], [place] [address]

## 2022-01-11 NOTE — Progress Notes (Unsigned)
[facility]   Subjective:   [name] is a 71 y.o. 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 Patient presented to the ED with increasing shortness of breath, chronic edema ,chest pain and a productive cough.   Admit date to the hospital was 12/16/21, patient was discharged from the hospital on 12/24/21, patient was admitted for:  Acute on chronic systolic CHF (congestive heart failure), Atherosclerosis of native coronary artery of native heart with unstable angina pectoris, Diabetes mellitus, Dyslipidemia, Essential hypertension, Coronary artery disease, S/P CABG x 3,  Acute respiratory failure with hypoxia  and. AKI (acute kidney injury). Today he states he's just tired. Patient has No headache, No chest pain, No abdominal pain - No Nausea, No new weakness tingling or numbness, No Cough - shortness of breath . Denies polyuria, polydipsia, polyphagia or vision changes. Bp is elevated question why states he taking all his medication. Complains of indigestion and a bad taste in his mouth.He admits to eating spicy foods, coffee   only 4 oz no sodas and stop drinking 2017.  Called placed to cardiologist secondary to elevated blood pressure and patient stating taking medication as prescribed.  Spoke with Nigel Mormon, MD verbal orders given for medication changes and follow-up appointment scheduled for the following week. Past Medical History:  Diagnosis Date   Arrhythmia    CAD (coronary artery disease)    stent x 2   Carotid artery occlusion    Diabetes mellitus    Dyslipidemia    Essential hypertension      No Known Allergies    Current Outpatient Medications on File Prior to Visit  Medication Sig Dispense Refill   clopidogrel (PLAVIX) 75 MG tablet Take 1 tablet (75 mg total) by mouth daily. 90 tablet 3   furosemide (LASIX) 20 MG tablet Take 3 tablets (60 mg total) by mouth daily. 30 tablet 0   insulin isophane & regular human  KwikPen (NOVOLIN 70/30 KWIKPEN) (70-30) 100 UNIT/ML KwikPen Inject 30 Units into the skin 2 (two) times daily before a meal. (Patient taking differently: Inject 40 Units into the skin daily.) 30 mL 3   isosorbide-hydrALAZINE (BIDIL) 20-37.5 MG tablet Take 0.5 tablets by mouth 3 (three) times daily. 45 tablet 0   ivabradine (CORLANOR) 5 MG TABS tablet Take 1 tablet (5 mg total) by mouth 2 (two) times daily with a meal. 60 tablet 0   nitroGLYCERIN (NITROSTAT) 0.4 MG SL tablet Place 1 tab (0.35m total) under the tongue every 5 minutes as needed for chest pain 30 tablet 3   pantoprazole (PROTONIX) 20 MG tablet Take 1 tablet (20 mg total) by mouth daily. 90 tablet 3   rosuvastatin (CRESTOR) 5 MG tablet Take 1 tablet (5 mg total) by mouth daily. 30 tablet 0   No current facility-administered medications on file prior to visit.     Review of System: Comprehensive ROS Pertinent positive and negative noted in HPI    Objective:  Blood Pressure (Abnormal) 196/91   Pulse 79   Height 5' 3.5" (1.613 m)   Weight 125 lb (56.7 kg)   Oxygen Saturation 95%   Body Mass Index 21.80 kg/m   Filed Weights   01/11/22 1514  Weight: 125 lb (56.7 kg)    Physical Exam: General Appearance: Well nourished, in no apparent distress. Eyes: PERRLA, EOMs, conjunctiva no swelling or erythema Sinuses: No Frontal/maxillary tenderness ENT/Mouth: Ext aud canals clear, TMs without erythema, bulging. No erythema, swelling, or  exudate on post pharynx.  Tonsils not swollen or erythematous. Hearing normal.  Neck: Supple, thyroid normal.  Respiratory: Respiratory effort normal, BS equal bilaterally without rales, rhonchi, wheezing or stridor.  Cardio: RRR with no MRGs. Brisk peripheral pulses without edema.  Abdomen: Soft, + BS.  Non tender, no guarding, rebound, hernias, masses. Lymphatics: Non tender without lymphadenopathy.  Musculoskeletal: Full ROM, 5/5 strength, normal gait.  Skin: Warm, dry without rashes, lesions,  ecchymosis.  Neuro: Cranial nerves intact. Normal muscle tone, no cerebellar symptoms. Sensation intact.  Psych: Awake and oriented X 3, normal affect, Insight and Judgment appropriate.    Assessment:  Colin [name] was seen today for hospitalization follow-up.  Diagnoses and all orders for this visit:  Hospital discharge follow-up Schedule with PCP and cardiology  Essential hypertension Elevated call cardiologist medications change and an appointment is scheduled to follow-up with cardiology  Gastroesophageal reflux disease, unspecified whether esophagitis present Discussed eating small frequent meal, reduction in acidic foods, fried foods ,spicy foods, alcohol caffeine and tobacco and certain medications. Avoid laying down after eating 68mns-1hour, elevated head of the bed.   AKI (acute kidney injury) ([facility] -     CMP14+EGFR  Other orders -     isosorbide-hydrALAZINE (BIDIL) 20-37.5 MG tablet; Take 1 tablet by mouth 3 (three) times daily. -     metoprolol succinate (TOPROL-XL) 50 MG 24 hr tablet; Take 1 tablet (50 mg total) by mouth daily.    Extended time consulted with cardiologist   This note has been created with Dragon speech recognition software and [employer]Engineer, materials Any transcriptional errors are unintentional.   MKerin Perna NP 01/11/2022, 3:50 PM

## 2022-01-12 ENCOUNTER — Other Ambulatory Visit: Payer: Self-pay

## 2022-01-12 ENCOUNTER — Other Ambulatory Visit: Payer: Self-pay | Admitting: Pharmacist

## 2022-01-12 LAB — CMP14+EGFR
ALT: 22 IU/L (ref 0–44)
AST: 27 IU/L (ref 0–40)
Albumin/Globulin Ratio: 0.8 — ABNORMAL LOW (ref 1.2–2.2)
Albumin: 2.8 g/dL — ABNORMAL LOW (ref 3.8–4.8)
Alkaline Phosphatase: 90 IU/L (ref 44–121)
BUN/Creatinine Ratio: 14 (ref 10–24)
BUN: 26 mg/dL (ref 8–27)
Bilirubin Total: <0.2 mg/dL (ref 0.0–1.2)
CO2: 22 mmol/L (ref 20–29)
Calcium: 9 mg/dL (ref 8.6–10.2)
Chloride: 103 mmol/L (ref 96–106)
Creatinine, Ser: 1.92 mg/dL — ABNORMAL HIGH (ref 0.76–1.27)
Globulin, Total: 3.3 g/dL (ref 1.5–4.5)
Glucose: 379 mg/dL — ABNORMAL HIGH (ref 70–99)
Potassium: 5 mmol/L (ref 3.5–5.2)
Sodium: 142 mmol/L (ref 134–144)
Total Protein: 6.1 g/dL (ref 6.0–8.5)
eGFR: 37 mL/min/{1.73_m2} — ABNORMAL LOW (ref >59)

## 2022-01-12 MED ORDER — ROSUVASTATIN CALCIUM 5 MG PO TABS
5.0000 mg | ORAL_TABLET | Freq: Every day | ORAL | 3 refills | Status: DC
  Filled 2022-01-12: qty 30, 30d supply, fill #0

## 2022-01-12 MED ORDER — IVABRADINE HCL 5 MG PO TABS
5.0000 mg | ORAL_TABLET | Freq: Two times a day (BID) | ORAL | 0 refills | Status: DC
  Filled 2022-01-12 – 2022-01-31 (×2): qty 60, 30d supply, fill #0

## 2022-01-12 MED ORDER — FUROSEMIDE 20 MG PO TABS
60.0000 mg | ORAL_TABLET | Freq: Every day | ORAL | 2 refills | Status: DC
  Filled 2022-01-12: qty 90, 30d supply, fill #0

## 2022-01-12 NOTE — Telephone Encounter (Signed)
Requested medications are due for refill today.  unsure  Requested medications are on the active medications list.  yes  Last refill. 12/23/2021#30 0 rf  Future visit scheduled.   no  Notes to clinic.  Med was d/c'd 12/24/2021. Rx written by  Reather Littler.    Requested Prescriptions  Pending Prescriptions Disp Refills   rosuvastatin (CRESTOR) 40 MG tablet 90 tablet 3    Sig: Take 1 tablet by mouth once nightly at bedtime.     Cardiovascular:  Antilipid - Statins 2 Failed - 01/12/2022  7:35 AM      Failed - Cr in normal range and within 360 days    Creatinine, Ser  Date Value Ref Range Status  12/23/2021 3.44 (H) 0.61 - 1.24 mg/dL Final   Creatinine,U  Date Value Ref Range Status  06/29/2021 59.3 mg/dL Final         Failed - Lipid Panel in normal range within the last 12 months    Cholesterol, Total  Date Value Ref Range Status  11/11/2020 279 (H) 100 - 199 mg/dL Final   Cholesterol  Date Value Ref Range Status  11/08/2021 305 (H) 0 - 200 mg/dL Final    Comment:    ATP III Classification       Desirable:  < 200 mg/dL               Borderline High:  200 - 239 mg/dL          High:  > = 902 mg/dL   LDL Chol Calc (NIH)  Date Value Ref Range Status  11/11/2020 207 (H) 0 - 99 mg/dL Final   LDL Cholesterol  Date Value Ref Range Status  11/08/2021 225 (H) 0 - 99 mg/dL Final   Direct LDL  Date Value Ref Range Status  08/21/2020 137.1 (H) 0 - 99 mg/dL Final    Comment:    Performed at [facility], [address], [place], [place] [address]   HDL  Date Value Ref Range Status  11/08/2021 62.70 >39.00 mg/dL Final  53/29/9242 61 >68 mg/dL Final   Triglycerides  Date Value Ref Range Status  11/08/2021 84.0 0.0 - 149.0 mg/dL Final    Comment:    Normal:  <150 mg/dLBorderline High:  150 - 199 mg/dL         Passed - Patient is not pregnant      Passed - Valid encounter within last 12 months    Recent Outpatient Visits           [facility] discharge  follow-up   [facility] Grayce Sessions, NP   1 year ago Type 2 diabetes mellitus with diabetic polyneuropathy, with long-term current use of insulin (HCC)   [facility] Grayce Sessions, NP   1 year ago Type 2 diabetes mellitus with diabetic polyneuropathy, with long-term current use of insulin (HCC)   [facility] Grayce Sessions, NP   1 year ago Type 2 diabetes mellitus without complication, with long-term current use of insulin (HCC)   [facility] [place], Jeannett Senior L, RPH-CPP   1 year ago Type 2 diabetes mellitus without complication, with long-term current use of insulin (HCC)   [facility] Grayce Sessions, NP       Future Appointments             In 4 days Custovic, Rozell Searing, DO  [facility]

## 2022-01-12 NOTE — Telephone Encounter (Signed)
Requested medication (s) are due for refill today:   Not sure  Requested medication (s) are on the active medication list:   Yes  Future visit scheduled:   No   Seen yesterday by Gwinda Passe, NP for hospital f/u   Last ordered: 01/05/2022 #60, 0 refills by Dr. Delrae Sawyers Arrien from hospital  Returned because no protocol assigned this medication and from another provider.   Requested Prescriptions  Pending Prescriptions Disp Refills   ivabradine (CORLANOR) 5 MG TABS tablet 60 tablet 0    Sig: Take 1 tablet (5 mg total) by mouth 2 (two) times daily with a meal.     Off-Protocol Failed - 01/11/2022  5:06 PM      Failed - Medication not assigned to a protocol, review manually.      Passed - Valid encounter within last 12 months    Recent Outpatient Visits           [facility] discharge follow-up   [facility] Grayce Sessions, NP   1 year ago Type 2 diabetes mellitus with diabetic polyneuropathy, with long-term current use of insulin (HCC)   [facility] Grayce Sessions, NP   1 year ago Type 2 diabetes mellitus with diabetic polyneuropathy, with long-term current use of insulin (HCC)   [facility] Grayce Sessions, NP   1 year ago Type 2 diabetes mellitus without complication, with long-term current use of insulin [facility])   [facility] [place], Jeannett Senior L, RPH-CPP   1 year ago Type 2 diabetes mellitus without complication, with long-term current use of insulin (HCC)   [facility] Grayce Sessions, NP       Future Appointments             In 4 days Custovic, Rozell Searing, DO [facility]

## 2022-01-14 ENCOUNTER — Other Ambulatory Visit (INDEPENDENT_AMBULATORY_CARE_PROVIDER_SITE_OTHER): Payer: Self-pay | Admitting: Primary Care

## 2022-01-14 DIAGNOSIS — I214 Non-ST elevation (NSTEMI) myocardial infarction: Secondary | ICD-10-CM

## 2022-01-14 DIAGNOSIS — I5020 Unspecified systolic (congestive) heart failure: Secondary | ICD-10-CM

## 2022-01-14 DIAGNOSIS — I2510 Atherosclerotic heart disease of native coronary artery without angina pectoris: Secondary | ICD-10-CM

## 2022-01-14 DIAGNOSIS — N179 Acute kidney failure, unspecified: Secondary | ICD-10-CM

## 2022-01-14 DIAGNOSIS — E43 Unspecified severe protein-calorie malnutrition: Secondary | ICD-10-CM

## 2022-01-14 DIAGNOSIS — Z09 Encounter for follow-up examination after completed treatment for conditions other than malignant neoplasm: Secondary | ICD-10-CM

## 2022-01-15 ENCOUNTER — Other Ambulatory Visit: Payer: Self-pay

## 2022-01-15 ENCOUNTER — Telehealth (INDEPENDENT_AMBULATORY_CARE_PROVIDER_SITE_OTHER): Payer: Self-pay

## 2022-01-15 NOTE — Telephone Encounter (Signed)
Contacted pt to go over lab results pt stated to call his son. Reached out to his son [name] and provided results and son doesn't have any questions or concerns

## 2022-01-16 ENCOUNTER — Encounter: Payer: Self-pay | Admitting: Internal Medicine

## 2022-01-16 ENCOUNTER — Other Ambulatory Visit: Payer: Self-pay

## 2022-01-16 ENCOUNTER — Ambulatory Visit: Payer: Medicare Other | Admitting: Internal Medicine

## 2022-01-16 VITALS — BP 160/72 | HR 83 | Ht 63.5 in | Wt 128.4 lb

## 2022-01-16 DIAGNOSIS — I5020 Unspecified systolic (congestive) heart failure: Secondary | ICD-10-CM

## 2022-01-16 DIAGNOSIS — I2510 Atherosclerotic heart disease of native coronary artery without angina pectoris: Secondary | ICD-10-CM

## 2022-01-16 DIAGNOSIS — I10 Essential (primary) hypertension: Secondary | ICD-10-CM

## 2022-01-16 MED ORDER — METOPROLOL SUCCINATE ER 100 MG PO TB24
100.0000 mg | ORAL_TABLET | Freq: Every day | ORAL | 3 refills | Status: DC
  Filled 2022-01-16: qty 90, 90d supply, fill #0

## 2022-01-16 NOTE — Progress Notes (Signed)
Kidney function better than 3 weeks ago thought. Staff, please arrange follow up with me or [place].  Thanks MJP

## 2022-01-16 NOTE — Progress Notes (Signed)
  Patient referred by Edwards, Michelle P, NP for CAD  Subjective:   Colin [name], male    DOB: 02/01/1951, 71 y.[name].   MRN: [id number]  Son is at appointment translating.   Chief Complaint  Patient presents with   Hospitalization Follow-up    HPI  70-year-old [name] speaking patient with uncontrolled type 2 diabetes mellitus, mixed hyperlipidemia, tobacco dependence, coronary artery disease with prior MI, LCX and RCA stents in 2012, now with CABG X 3.  LIMA LAD, RSVG PDA, OM1  (08/23/2020), HFrEF  Patient is here for a follow-up appointment. Son is with him translating. Patient says he feels tired but otherwise has been doing ok since hospital discharge. He is currently not feeling swollen but sometimes he notices his legs swell more than usual. Patient states he is compliant with his medications. Patient denies chest pain, shortness of breath, palpitations, diaphoresis, syncope, edema, PND, orthopnea.    Past Medical History:  Diagnosis Date   Arrhythmia    CAD (coronary artery disease)    stent x 2   Carotid artery occlusion    Diabetes mellitus    Dyslipidemia    Essential hypertension      Past Surgical History:  Procedure Laterality Date   CARDIAC CATHETERIZATION     01/2010 ; 04/2010   CORONARY ARTERY BYPASS GRAFT N/A 08/23/2020   Procedure: CORONARY ARTERY BYPASS GRAFTING (CABG) x 3 ON CARDIOPULMONARY BYPASS USING EVH AND PARTIALLY OPEN HARVESTED LEFT GREATER SAPHENOUS VEIN AND LEFT INTERNAL MAMMARY ARTERY.  LIMA TO LAD, SVG TO OM, SVG TO PDA;  Surgeon: Lightfoot, Harrell O, MD;  Location: [facility] OR;  Service: Open Heart Surgery;  Laterality: N/A;   RIGHT HEART CATH N/A 12/19/2021   Procedure: RIGHT HEART CATH;  Surgeon: Patwardhan, Manish J, MD;  Location: [facility] INVASIVE CV [facility];  Service: Cardiovascular;  Laterality: N/A;   RIGHT/LEFT HEART CATH AND CORONARY ANGIOGRAPHY N/A 08/22/2020   Procedure: RIGHT/LEFT HEART CATH AND CORONARY ANGIOGRAPHY;  Surgeon: Patwardhan,  Manish J, MD;  Location: [facility];  Service: Cardiovascular;  Laterality: N/A;   TEE WITHOUT CARDIOVERSION N/A 08/23/2020   Procedure: TRANSESOPHAGEAL ECHOCARDIOGRAM (TEE);  Surgeon: Lightfoot, Harrell O, MD;  Location: [facility] OR;  Service: Open Heart Surgery;  Laterality: N/A;   TEMPORARY PACEMAKER N/A 08/22/2020   Procedure: TEMPORARY PACEMAKER;  Surgeon: Patwardhan, Manish J, MD;  Location: [facility];  Service: Cardiovascular;  Laterality: N/A;   VEIN HARVEST Left 08/23/2020   Procedure: ENDOVEIN HARVEST AND PARTIALLY OPEN GREATER SAPHENOUS VEIN HARVESTING;  Surgeon: Lightfoot, Harrell O, MD;  Location: [facility] OR;  Service: Open Heart Surgery;  Laterality: Left;     Social History   Tobacco Use  Smoking Status Former   Packs/day: 0.25   Years: 50.00   Total pack years: 12.50   Types: Cigarettes   Quit date: 08/2020   Years since quitting: 1.4  Smokeless Tobacco Never    Social History   Substance and Sexual Activity  Alcohol Use No     Family History  Problem Relation Age of Onset   Diabetes Neg Hx      Current Outpatient Medications on File Prior to Visit  Medication Sig Dispense Refill   clopidogrel (PLAVIX) 75 MG tablet Take 1 tablet (75 mg total) by mouth daily. 90 tablet 3   furosemide (LASIX) 20 MG tablet Take 3 tablets (60 mg total) by mouth daily. 90 tablet 2   insulin isophane & regular human KwikPen (NOVOLIN 70/30 KWIKPEN) (70-30)   100 UNIT/ML KwikPen Inject 30 Units into the skin 2 (two) times daily before a meal. (Patient taking differently: Inject 40 Units into the skin daily.) 30 mL 3   isosorbide-hydrALAZINE (BIDIL) 20-37.5 MG tablet Take 1 tablet by mouth 3 (three) times daily. 90 tablet 0   ivabradine (CORLANOR) 5 MG TABS tablet Take 1 tablet (5 mg total) by mouth 2 (two) times daily with a meal. 60 tablet 0   nitroGLYCERIN (NITROSTAT) 0.4 MG SL tablet Place 1 tab (0.4mg total) under the tongue every 5 minutes as needed for chest pain 30 tablet 3    pantoprazole (PROTONIX) 20 MG tablet Take 1 tablet (20 mg total) by mouth daily. 90 tablet 3   rosuvastatin (CRESTOR) 5 MG tablet Take 1 tablet (5 mg total) by mouth daily. 30 tablet 3   No current facility-administered medications on file prior to visit.    Cardiovascular and other pertinent studies:  EKG 08/29/2020: Sinus rhythm 80 bpm Anteroseptal and inferior infarct, age indeterminate    Op note 08/23/2020 (Dr. Lightfoot): CABG X 3.  LIMA LAD, RSVG PDA, OM1     Coronary angiography 08/22/2020: LM: Minimal disease LAD: Prox-mid long 80% stenosis Lcx: Lcx/OM1 95% ISR RCA: Prox 80% ISR, followed by In-stent CTO mid-distal vessel Left-to-right collaterals to distal vessel Mildly decompensated ischemic cardiomyopathy Temp pacer placed through RIJ   Surgical consult for CABG   Echocardiogram 08/20/2020:  1. Left ventricular ejection fraction, by estimation, is 30 to 35%. The  left ventricle has moderate to severely decreased function. The left  ventricle demonstrates global hypokinesis. The left ventricular internal  cavity size was mildly dilated. Left  ventricular diastolic parameters are consistent with Grade II diastolic  dysfunction (pseudonormalization). Elevated left atrial pressure.   2. Right ventricular systolic function is mildly reduced. The right  ventricular size is normal.   3. Left atrial size was mildly dilated.   4. The mitral valve is degenerative. Moderate mitral valve regurgitation.  No evidence of mitral stenosis.   5. The aortic valve is tricuspid. Aortic valve regurgitation is not  visualized. Mild aortic valve sclerosis is present, with no evidence of  aortic valve stenosis.   6. The inferior vena cava is normal in size with greater than 50%  respiratory variability, suggesting right atrial pressure of 3 mmHg.   7. Evidence of atrial level shunting detected by color flow Doppler.     12/17/2021   ECHOCARDIOGRAM REPORT    1. Left ventricular  ejection fraction, by estimation, is 30 to 35%. Left ventricular ejection fraction by 2D MOD biplane is 31.3 %. The left ventricle has moderately decreased function. The left ventricle demonstrates global hypokinesis. Left ventricular diastolic parameters are consistent with Grade III diastolic dysfunction (restrictive). Elevated left ventricular end-diastolic pressure.  2. Right ventricular systolic function is mildly reduced. The right ventricular size is normal. There is normal pulmonary artery systolic pressure. The estimated right ventricular systolic pressure is 26.4 mmHg.  3. Left atrial size was moderately dilated.  4. The mitral valve is abnormal. Moderate to severe mitral valve regurgitation.  5. The aortic valve is tricuspid. Aortic valve regurgitation is not visualized. Aortic valve sclerosis/calcification is present, without any evidence of aortic stenosis.  6. The inferior vena cava is normal in size with greater than 50% respiratory variability, suggesting right atrial pressure of 3 mmHg.  7. Evidence of atrial level shunting detected by color flow Doppler. There is a small patent foramen ovale with predominantly left to right shunting across   the atrial septum.     12/19/2021 right heart catheterization RA: 5 mmHg RV: 45/0 mmHg PA: 51/11 mmHg, mPAP 27 mmHg PCW: 12 mmHg   CO: 3.8 L/min CI: 2.2 L/min/m2   Please note that patient was on HFNC during the procedure owing to hypoxia.   Fairly compensated ischemic cardiomyopathy Mild PH, WHO Grp II     Recent labs: 08/29/2020: Glucose 199, BUN/Cr 37/1.34. EGFR 57. Na/K 137/4.4. Albumin 2.6. Rest of the CMP normal H/H 11.5/35,3. MCV 92.9. Platelets 353 HbA1C 8.4% Chol 187, TG 53, HDL 40, LDL 137 TSH N/A    Review of Systems  Cardiovascular:  Negative for chest pain, dyspnea on exertion, leg swelling, palpitations and syncope.         Vitals:   01/16/22 1513 01/16/22 1517  BP: (!) 150/70 (!) 160/72   Pulse: 85 83  SpO2: 93%      Body mass index is 22.39 kg/m. Filed Weights   01/16/22 1513  Weight: 128 lb 6.4 oz (58.2 kg)     Objective:   Physical Exam Vitals and nursing note reviewed.  Constitutional:      General: He is not in acute distress. Neck:     Vascular: No JVD.  Cardiovascular:     Rate and Rhythm: Normal rate and regular rhythm.     Pulses:          Radial pulses are 2+ on the right side and 2+ on the left side.       Dorsalis pedis pulses are 0 on the right side and 0 on the left side.       Posterior tibial pulses are 2+ on the right side and 2+ on the left side.     Heart sounds: Normal heart sounds. No murmur heard. Pulmonary:     Effort: Pulmonary effort is normal.     Breath sounds: Normal breath sounds. No wheezing or rales.  Musculoskeletal:     Right lower leg: No edema.     Left lower leg: No edema.          ssessment & Recommendations:   71 year old [name] speaking patient with uncontrolled type 2 diabetes mellitus, mixed hyperlipidemia, tobacco dependence, coronary artery disease with prior MI, LCX and RCA stents in 2012, now with CABG X 3.  LIMA LAD, RSVG PDA, OM1  (08/23/2020), HFrEF    CAD:  S/[name] CABG X 3.  LIMA LAD, RSVG PDA, OM1  (08/23/2020) Patient states he is compliant however he does not like going to pick up his meds every month Completed DAPT with Aspirin and plavix till 08/2021. Resume rosuvastatin - cannot tolerate higher dose Will increase Toprol to 184m as BP is still high and HR >60    HFrEF: EF 30-35%. Ischemic cardiomyopathy. Euvolumic.  Currently, he does not appear to be in clinical heart failure. Instructed patient to take two Lasix pills on days he feels very swollen   Type 2 DM: On insulin    PAD: Asymptomatic at this point. Continue medical therapy.   Follow-up in 3 months or sooner if needed    SSammuel HinesFCypress Creek Outpatient Surgical Center LLCPager: [phone]: [phone]

## 2022-01-22 ENCOUNTER — Observation Stay (HOSPITAL_COMMUNITY): Payer: Medicare Other

## 2022-01-22 ENCOUNTER — Emergency Department (HOSPITAL_COMMUNITY): Payer: Medicare Other

## 2022-01-22 ENCOUNTER — Observation Stay (HOSPITAL_COMMUNITY)
Admission: EM | Admit: 2022-01-22 | Discharge: 2022-01-23 | Disposition: A | Discharge status: Home / Self Care | Payer: Medicare Other | Attending: Internal Medicine | Admitting: Internal Medicine

## 2022-01-22 ENCOUNTER — Other Ambulatory Visit: Payer: Self-pay

## 2022-01-22 DIAGNOSIS — I2510 Atherosclerotic heart disease of native coronary artery without angina pectoris: Secondary | ICD-10-CM | POA: Insufficient documentation

## 2022-01-22 DIAGNOSIS — I959 Hypotension, unspecified: Secondary | ICD-10-CM | POA: Diagnosis not present

## 2022-01-22 DIAGNOSIS — R001 Bradycardia, unspecified: Secondary | ICD-10-CM | POA: Diagnosis not present

## 2022-01-22 DIAGNOSIS — Z1152 Encounter for screening for COVID-19: Secondary | ICD-10-CM | POA: Diagnosis not present

## 2022-01-22 DIAGNOSIS — Z87891 Personal history of nicotine dependence: Secondary | ICD-10-CM | POA: Insufficient documentation

## 2022-01-22 DIAGNOSIS — R770 Abnormality of albumin: Secondary | ICD-10-CM | POA: Insufficient documentation

## 2022-01-22 DIAGNOSIS — I5020 Unspecified systolic (congestive) heart failure: Secondary | ICD-10-CM | POA: Diagnosis present

## 2022-01-22 DIAGNOSIS — Z7902 Long term (current) use of antithrombotics/antiplatelets: Secondary | ICD-10-CM | POA: Diagnosis not present

## 2022-01-22 DIAGNOSIS — E1165 Type 2 diabetes mellitus with hyperglycemia: Secondary | ICD-10-CM | POA: Diagnosis present

## 2022-01-22 DIAGNOSIS — E785 Hyperlipidemia, unspecified: Secondary | ICD-10-CM | POA: Insufficient documentation

## 2022-01-22 DIAGNOSIS — E860 Dehydration: Secondary | ICD-10-CM | POA: Diagnosis not present

## 2022-01-22 DIAGNOSIS — E1169 Type 2 diabetes mellitus with other specified complication: Secondary | ICD-10-CM | POA: Diagnosis not present

## 2022-01-22 DIAGNOSIS — Z794 Long term (current) use of insulin: Secondary | ICD-10-CM | POA: Diagnosis not present

## 2022-01-22 DIAGNOSIS — Z950 Presence of cardiac pacemaker: Secondary | ICD-10-CM | POA: Diagnosis not present

## 2022-01-22 DIAGNOSIS — N1832 Chronic kidney disease, stage 3b: Secondary | ICD-10-CM | POA: Diagnosis not present

## 2022-01-22 DIAGNOSIS — Z951 Presence of aortocoronary bypass graft: Secondary | ICD-10-CM | POA: Insufficient documentation

## 2022-01-22 DIAGNOSIS — I5022 Chronic systolic (congestive) heart failure: Secondary | ICD-10-CM | POA: Insufficient documentation

## 2022-01-22 DIAGNOSIS — R2681 Unsteadiness on feet: Secondary | ICD-10-CM | POA: Diagnosis not present

## 2022-01-22 DIAGNOSIS — I130 Hypertensive heart and chronic kidney disease with heart failure and stage 1 through stage 4 chronic kidney disease, or unspecified chronic kidney disease: Secondary | ICD-10-CM | POA: Insufficient documentation

## 2022-01-22 DIAGNOSIS — Z955 Presence of coronary angioplasty implant and graft: Secondary | ICD-10-CM | POA: Diagnosis not present

## 2022-01-22 DIAGNOSIS — E440 Moderate protein-calorie malnutrition: Secondary | ICD-10-CM | POA: Diagnosis not present

## 2022-01-22 DIAGNOSIS — Z79899 Other long term (current) drug therapy: Secondary | ICD-10-CM | POA: Insufficient documentation

## 2022-01-22 DIAGNOSIS — R531 Weakness: Secondary | ICD-10-CM | POA: Insufficient documentation

## 2022-01-22 DIAGNOSIS — R55 Syncope and collapse: Secondary | ICD-10-CM | POA: Insufficient documentation

## 2022-01-22 DIAGNOSIS — R946 Abnormal results of thyroid function studies: Secondary | ICD-10-CM | POA: Diagnosis not present

## 2022-01-22 DIAGNOSIS — I10 Essential (primary) hypertension: Secondary | ICD-10-CM | POA: Diagnosis present

## 2022-01-22 DIAGNOSIS — E1122 Type 2 diabetes mellitus with diabetic chronic kidney disease: Secondary | ICD-10-CM | POA: Diagnosis not present

## 2022-01-22 DIAGNOSIS — R809 Proteinuria, unspecified: Secondary | ICD-10-CM | POA: Insufficient documentation

## 2022-01-22 LAB — URINALYSIS, ROUTINE W REFLEX MICROSCOPIC
Bacteria, UA: NONE SEEN
Bilirubin Urine: NEGATIVE
Glucose, UA: >=500 mg/dL — AB
Hgb urine dipstick: NEGATIVE
Ketones, ur: NEGATIVE mg/dL
Leukocytes,Ua: NEGATIVE
Nitrite: NEGATIVE
Protein, ur: >=300 mg/dL — AB
Specific Gravity, Urine: 1.021 (ref 1.005–1.030)
pH: 7 (ref 5.0–8.0)

## 2022-01-22 LAB — CBC WITH DIFFERENTIAL/PLATELET
Abs Immature Granulocytes: 0.05 10*3/uL (ref 0.00–0.07)
Basophils Absolute: 0.1 10*3/uL (ref 0.0–0.1)
Basophils Relative: 1 %
Eosinophils Absolute: 0.2 10*3/uL (ref 0.0–0.5)
Eosinophils Relative: 2 %
HCT: 31.5 % — ABNORMAL LOW (ref 39.0–52.0)
Hemoglobin: 10.4 g/dL — ABNORMAL LOW (ref 13.0–17.0)
Immature Granulocytes: 1 %
Lymphocytes Relative: 16 %
Lymphs Abs: 1.4 10*3/uL (ref 0.7–4.0)
MCH: 31.3 pg (ref 26.0–34.0)
MCHC: 33 g/dL (ref 30.0–36.0)
MCV: 94.9 fL (ref 80.0–100.0)
Monocytes Absolute: 0.5 10*3/uL (ref 0.1–1.0)
Monocytes Relative: 6 %
Neutro Abs: 6.9 10*3/uL (ref 1.7–7.7)
Neutrophils Relative %: 74 %
Platelets: 184 10*3/uL (ref 150–400)
RBC: 3.32 MIL/uL — ABNORMAL LOW (ref 4.22–5.81)
RDW: 13.5 % (ref 11.5–15.5)
WBC: 9.2 10*3/uL (ref 4.0–10.5)
nRBC: 0 % (ref 0.0–0.2)

## 2022-01-22 LAB — COMPREHENSIVE METABOLIC PANEL
ALT: 22 U/L (ref 0–44)
AST: 28 U/L (ref 15–41)
Albumin: 1.8 g/dL — ABNORMAL LOW (ref 3.5–5.0)
Alkaline Phosphatase: 57 U/L (ref 38–126)
Anion gap: 6 (ref 5–15)
BUN: 30 mg/dL — ABNORMAL HIGH (ref 8–23)
CO2: 31 mmol/L (ref 22–32)
Calcium: 8.3 mg/dL — ABNORMAL LOW (ref 8.9–10.3)
Chloride: 106 mmol/L (ref 98–111)
Creatinine, Ser: 2.31 mg/dL — ABNORMAL HIGH (ref 0.61–1.24)
GFR, Estimated: 29 mL/min — ABNORMAL LOW (ref >60)
Glucose, Bld: 88 mg/dL (ref 70–99)
Potassium: 3.8 mmol/L (ref 3.5–5.1)
Sodium: 143 mmol/L (ref 135–145)
Total Bilirubin: 0.4 mg/dL (ref 0.3–1.2)
Total Protein: 5.2 g/dL — ABNORMAL LOW (ref 6.5–8.1)

## 2022-01-22 LAB — RESP PANEL BY RT-PCR (RSV, FLU A&B, COVID)  RVPGX2
Influenza A by PCR: NEGATIVE
Influenza B by PCR: NEGATIVE
Resp Syncytial Virus by PCR: NEGATIVE
SARS Coronavirus 2 by RT PCR: NEGATIVE

## 2022-01-22 LAB — BRAIN NATRIURETIC PEPTIDE: B Natriuretic Peptide: 371.5 pg/mL — ABNORMAL HIGH (ref 0.0–100.0)

## 2022-01-22 LAB — I-STAT VENOUS BLOOD GAS, ED
Acid-Base Excess: 4 mmol/L — ABNORMAL HIGH (ref 0.0–2.0)
Bicarbonate: 28.4 mmol/L — ABNORMAL HIGH (ref 20.0–28.0)
Calcium, Ion: 1.11 mmol/L — ABNORMAL LOW (ref 1.15–1.40)
HCT: 28 % — ABNORMAL LOW (ref 39.0–52.0)
Hemoglobin: 9.5 g/dL — ABNORMAL LOW (ref 13.0–17.0)
O2 Saturation: 76 %
Potassium: 4.2 mmol/L (ref 3.5–5.1)
Sodium: 141 mmol/L (ref 135–145)
TCO2: 30 mmol/L (ref 22–32)
pCO2, Ven: 41.1 mmHg — ABNORMAL LOW (ref 44–60)
pH, Ven: 7.448 — ABNORMAL HIGH (ref 7.25–7.43)
pO2, Ven: 39 mmHg (ref 32–45)

## 2022-01-22 LAB — TROPONIN I (HIGH SENSITIVITY)
Troponin I (High Sensitivity): 18 ng/L — ABNORMAL HIGH (ref <18)
Troponin I (High Sensitivity): 18 ng/L — ABNORMAL HIGH (ref <18)

## 2022-01-22 LAB — MAGNESIUM: Magnesium: 2.3 mg/dL (ref 1.7–2.4)

## 2022-01-22 LAB — PREALBUMIN: Prealbumin: 34 mg/dL (ref 18–38)

## 2022-01-22 LAB — CBG MONITORING, ED
Glucose-Capillary: 64 mg/dL — ABNORMAL LOW (ref 70–99)
Glucose-Capillary: 84 mg/dL (ref 70–99)
Glucose-Capillary: 193 mg/dL — ABNORMAL HIGH (ref 70–99)

## 2022-01-22 LAB — LIPASE, BLOOD: Lipase: 28 U/L (ref 11–51)

## 2022-01-22 LAB — LACTIC ACID, PLASMA
Lactic Acid, Venous: 1.4 mmol/L (ref 0.5–1.9)
Lactic Acid, Venous: 1.5 mmol/L (ref 0.5–1.9)

## 2022-01-22 LAB — PROTEIN / CREATININE RATIO, URINE
Creatinine, Urine: 84 mg/dL
Protein Creatinine Ratio: 1 mg/mg{Cre} — ABNORMAL HIGH (ref 0.00–0.15)
Total Protein, Urine: 84 mg/dL

## 2022-01-22 LAB — TSH: TSH: 5.058 u[IU]/mL — ABNORMAL HIGH (ref 0.350–4.500)

## 2022-01-22 LAB — ETHANOL: Alcohol, Ethyl (B): <10 mg/dL (ref <10)

## 2022-01-22 LAB — CK: Total CK: 85 U/L (ref 49–397)

## 2022-01-22 MED ORDER — SODIUM CHLORIDE 0.9 % IV SOLN: 250.0000 mL | INTRAVENOUS | Status: DC | PRN

## 2022-01-22 MED ORDER — HYDRALAZINE HCL 20 MG/ML IJ SOLN: 5.0000 mg | Freq: Four times a day (QID) | INTRAMUSCULAR | Status: DC | PRN

## 2022-01-22 MED ORDER — PANTOPRAZOLE SODIUM 20 MG PO TBEC
20.0000 mg | DELAYED_RELEASE_TABLET | Freq: Every day | ORAL | Status: DC
  Filled 2022-01-22: qty 1

## 2022-01-22 MED ORDER — CLOPIDOGREL BISULFATE 75 MG PO TABS
75.0000 mg | ORAL_TABLET | Freq: Every day | ORAL | Status: DC
  Filled 2022-01-22: qty 1

## 2022-01-22 MED ORDER — SODIUM CHLORIDE 0.9% FLUSH: 3.0000 mL | INTRAVENOUS | Status: DC | PRN

## 2022-01-22 MED ORDER — IVABRADINE HCL 5 MG PO TABS
5.0000 mg | ORAL_TABLET | Freq: Two times a day (BID) | ORAL | Status: DC
  Filled 2022-01-22: qty 1

## 2022-01-22 MED ORDER — ISOSORB DINITRATE-HYDRALAZINE 20-37.5 MG PO TABS
1.0000 | ORAL_TABLET | Freq: Three times a day (TID) | ORAL | Status: DC
  Administered 2022-01-22: 1 via ORAL
  Filled 2022-01-22 (×2): qty 1

## 2022-01-22 MED ORDER — SODIUM CHLORIDE 0.9 % IV BOLUS
500.0000 mL | Freq: Once | INTRAVENOUS | Status: AC
  Administered 2022-01-22: 500 mL via INTRAVENOUS

## 2022-01-22 MED ORDER — INSULIN ASPART 100 UNIT/ML IJ SOLN: 0.0000 [IU] | Freq: Three times a day (TID) | INTRAMUSCULAR | Status: DC

## 2022-01-22 MED ORDER — ENSURE ENLIVE PO LIQD
237.0000 mL | Freq: Two times a day (BID) | ORAL | Status: DC
  Filled 2022-01-22: qty 237

## 2022-01-22 MED ORDER — ACETAMINOPHEN 325 MG PO TABS: 650.0000 mg | ORAL_TABLET | ORAL | Status: DC | PRN

## 2022-01-22 MED ORDER — SODIUM CHLORIDE 0.9% FLUSH: 3.0000 mL | Freq: Two times a day (BID) | INTRAVENOUS | Status: DC

## 2022-01-22 MED ORDER — ONDANSETRON HCL 4 MG/2ML IJ SOLN: 4.0000 mg | Freq: Four times a day (QID) | INTRAMUSCULAR | Status: DC | PRN

## 2022-01-22 MED ORDER — HEPARIN SODIUM (PORCINE) 5000 UNIT/ML IJ SOLN
5000.0000 [IU] | Freq: Two times a day (BID) | INTRAMUSCULAR | Status: DC
  Administered 2022-01-22: 5000 [IU] via SUBCUTANEOUS
  Filled 2022-01-22 (×2): qty 1

## 2022-01-22 MED ORDER — ROSUVASTATIN CALCIUM 5 MG PO TABS
5.0000 mg | ORAL_TABLET | Freq: Every day | ORAL | Status: DC
  Filled 2022-01-22: qty 1

## 2022-01-22 NOTE — ED Notes (Signed)
Pt encouraged to provide urine specimen per MD order.  

## 2022-01-22 NOTE — Progress Notes (Addendum)
Prealbumin normal, UA and urine protein still pending. Clinically suspect nephrotic syndrome, will check renal U/S.  Son updated over phone.

## 2022-01-22 NOTE — ED Triage Notes (Signed)
Patient presents to ed via [facility] states patient's home health nurse was checking on him and he was unsteady on his feet was having problems sitting in chair, patient c/o feeling tired and weak. Patient denies pain   Patient is alert and oriented.

## 2022-01-22 NOTE — ED Notes (Signed)
Pt cbg 64 mg/dl, pt asymptomatic, apple juice given; will continue to monitor.

## 2022-01-22 NOTE — ED Notes (Signed)
OJ given and pt encouraged to urinate

## 2022-01-22 NOTE — ED Provider Notes (Signed)
Emergency Department Provider Note   I have reviewed the triage vital signs and the nursing notes.   HISTORY  Chief Complaint Weakness   HPI Colin [name] is a 71 y.o. male with PMH of CAD, DM, HLD, HTN presents to the emergency department by EMS with decreased energy and alertness.  Patient denies any pain or shortness of breath.  EMS report that on scene he had a blood pressure in the 70s and apparently was given 1 L IV fluid prior to ED arrival.  Denies any fevers or chills.  He was apparently evaluated by the home health nurse today who found him to be unsteady on his feet due to weakness.  Patient denies numbness, headache, speech disturbance.   Past Medical History:  Diagnosis Date   Arrhythmia    CAD (coronary artery disease)    stent x 2   Carotid artery occlusion    Diabetes mellitus    Dyslipidemia    Essential hypertension     Review of Systems  Constitutional: No fever/chills. Positive generalized weakness.  Cardiovascular: Denies chest pain. Respiratory: Denies shortness of breath. Gastrointestinal: No abdominal pain.  No nausea, no vomiting.  No diarrhea.  Musculoskeletal: Negative for back pain. Skin: Negative for rash. Neurological: Negative for headaches, focal weakness or numbness.   ____________________________________________   PHYSICAL EXAM:  VITAL SIGNS: ED Triage Vitals  Enc Vitals Group     BP 01/22/22 1145 (!) 123/58     Pulse Rate 01/22/22 1145 (!) 49     Resp 01/22/22 1145 16     Temp 01/22/22 1145 (!) 97.2 F (36.2 C)     Temp Source 01/22/22 1145 Oral     SpO2 01/22/22 1145 98 %     Weight 01/22/22 1147 128 lb 4.9 oz (58.2 kg)     Height 01/22/22 1147 5\' 3"  (1.6 m)   Constitutional: Alert and oriented. Well appearing and in no acute distress. Eyes: Conjunctivae are normal.  Head: Atraumatic. Nose: No congestion/rhinnorhea. Mouth/Throat: Mucous membranes are slightly dry.  Neck: No stridor.  Cardiovascular: Bradycardia. Good  peripheral circulation. Grossly normal heart sounds.   Respiratory: Normal respiratory effort.  No retractions. Lungs CTAB. Gastrointestinal: Soft and nontender. No distention.  Musculoskeletal: No lower extremity tenderness nor edema. No gross deformities of extremities. Neurologic:  Normal speech and language. No gross focal neurologic deficits are appreciated.  Skin:  Skin is warm, dry and intact. No rash noted.  ____________________________________________   LABS (all labs ordered are listed, but only abnormal results are displayed)  Labs Reviewed  URINE CULTURE - Abnormal; Notable for the following components:      Result Value   Culture MULTIPLE SPECIES PRESENT, SUGGEST RECOLLECTION (*)    All other components within normal limits  COMPREHENSIVE METABOLIC PANEL - Abnormal; Notable for the following components:   BUN 30 (*)    Creatinine, Ser 2.31 (*)    Calcium 8.3 (*)    Total Protein 5.2 (*)    Albumin 1.8 (*)    GFR, Estimated 29 (*)    All other components within normal limits  CBC WITH DIFFERENTIAL/PLATELET - Abnormal; Notable for the following components:   RBC 3.32 (*)    Hemoglobin 10.4 (*)    HCT 31.5 (*)    All other components within normal limits  URINALYSIS, ROUTINE W REFLEX MICROSCOPIC - Abnormal; Notable for the following components:   Glucose, UA >=500 (*)    Protein, ur >=300 (*)    All other  components within normal limits  BRAIN NATRIURETIC PEPTIDE - Abnormal; Notable for the following components:   B Natriuretic Peptide 371.5 (*)    All other components within normal limits  TSH - Abnormal; Notable for the following components:   TSH 5.058 (*)    All other components within normal limits  PROTEIN / CREATININE RATIO, URINE - Abnormal; Notable for the following components:   Protein Creatinine Ratio 1.00 (*)    All other components within normal limits  BASIC METABOLIC PANEL - Abnormal; Notable for the following components:   Glucose, Bld 118 (*)     BUN 28 (*)    Creatinine, Ser 2.14 (*)    Calcium 8.1 (*)    GFR, Estimated 32 (*)    All other components within normal limits  I-STAT VENOUS BLOOD GAS, ED - Abnormal; Notable for the following components:   pH, Ven 7.448 (*)    pCO2, Ven 41.1 (*)    Bicarbonate 28.4 (*)    Acid-Base Excess 4.0 (*)    Calcium, Ion 1.11 (*)    HCT 28.0 (*)    Hemoglobin 9.5 (*)    All other components within normal limits  CBG MONITORING, ED - Abnormal; Notable for the following components:   Glucose-Capillary 64 (*)    All other components within normal limits  CBG MONITORING, ED - Abnormal; Notable for the following components:   Glucose-Capillary 193 (*)    All other components within normal limits  CBG MONITORING, ED - Abnormal; Notable for the following components:   Glucose-Capillary 138 (*)    All other components within normal limits  TROPONIN I (HIGH SENSITIVITY) - Abnormal; Notable for the following components:   Troponin I (High Sensitivity) 18 (*)    All other components within normal limits  TROPONIN I (HIGH SENSITIVITY) - Abnormal; Notable for the following components:   Troponin I (High Sensitivity) 18 (*)    All other components within normal limits  RESP PANEL BY RT-PCR (RSV, FLU A&B, COVID)  RVPGX2  ETHANOL  LIPASE, BLOOD  LACTIC ACID, PLASMA  LACTIC ACID, PLASMA  MAGNESIUM  CK  PREALBUMIN  CBG MONITORING, ED   ____________________________________________  EKG   EKG Interpretation  Date/Time:  Monday January 22 2022 11:12:25 EST Ventricular Rate:  50 PR Interval:  218 QRS Duration: 119 QT Interval:  489 QTC Calculation: 446 R Axis:   -34 Text Interpretation: Sinus rhythm Borderline prolonged PR interval Incomplete left bundle branch block LVH with secondary repolarization abnormality Anterior Q waves, possibly due to LVH HR slower Confirmed by Alona Bene [phone]) on 01/22/2022 11:22:38 AM        ____________________________________________  RADIOLOGY  DG Chest Portable 1 View  Result Date: 01/22/2022 CLINICAL DATA:  Altered mental status. EXAM: PORTABLE CHEST 1 VIEW COMPARISON:  Chest radiograph November 17, 23. FINDINGS: No consolidation. No visible pleural effusions or pneumothorax. Similar mild enlargement the cardiac silhouette. Median sternotomy. Polyarticular degenerative change. IMPRESSION: No evidence of acute cardiopulmonary disease. Electronically Signed   By: Feliberto Harts M.D.   On: 01/22/2022 11:45    ____________________________________________   PROCEDURES  Procedure(s) performed:   Procedures  CRITICAL CARE Performed by: Maia Plan Total critical care time: 35 minutes Critical care time was exclusive of separately billable procedures and treating other patients. Critical care was necessary to treat or prevent imminent or life-threatening deterioration. Critical care was time spent personally by me on the following activities: development of treatment plan with patient and/or surrogate as well  as nursing, discussions with consultants, evaluation of patient's response to treatment, examination of patient, obtaining history from patient or surrogate, ordering and performing treatments and interventions, ordering and review of laboratory studies, ordering and review of radiographic studies, pulse oximetry and re-evaluation of patient's condition.  Alona Bene, MD Emergency Medicine  ____________________________________________   INITIAL IMPRESSION / ASSESSMENT AND PLAN / ED COURSE  Pertinent labs & imaging results that were available during my care of the patient were reviewed by me and considered in my medical decision making (see chart for details).   This patient is Presenting for Evaluation of AMS, which does require a range of treatment options, and is a complaint that involves a high risk of morbidity and mortality.  The Differential  Diagnoses includes but is not exclusive to alcohol, illicit or prescription medications, intracranial pathology such as stroke, intracerebral hemorrhage, fever or infectious causes including sepsis, hypoxemia, uremia, trauma, endocrine related disorders such as diabetes, hypoglycemia, thyroid-related diseases, etc.   Critical Interventions-    Medications  sodium chloride 0.9 % bolus 500 mL (0 mLs Intravenous Stopped 01/22/22 2000)    Reassessment after intervention: BP improved.    I did obtain Additional Historical Information from EMS.  I decided to review pertinent External Data, and in summary patient with d/c on 12/23/21 with acute on chronic CHF (EF 30-35%).    Clinical Laboratory Tests Ordered, included mild AKI with creatinine at 2.14. UA without infection.   Radiologic Tests Ordered, included CXR. I independently interpreted the images and agree with radiology interpretation.   Cardiac Monitor Tracing which shows bradycardia.   Social Determinants of Health Risk no active smoking history.   Consult complete with Hospitalist. Plan for admit.   Medical Decision Making: Summary:  Presents to the emergency department with generalized weakness, hypotension in the field that has since responded to IV fluids given by EMS.  He arrives afebrile but bradycardic and normotensive.  No hypoxemia.  He does not appear acutely volume overloaded on the initial presentation.  I do not appreciate any focal neurologic deficits to suspect stroke or other acute CNS process.  Reevaluation with update and discussion with patient and family at bedside. Plan for admit. Labs and other results reviewed.   Patient's presentation is most consistent with acute presentation with potential threat to life or bodily function.   Disposition: admit  ____________________________________________  FINAL CLINICAL IMPRESSION(S) / ED DIAGNOSES  Final diagnoses:  Generalized weakness  Bradycardia    Note:   This document was prepared using Dragon voice recognition software and may include unintentional dictation errors.  Alona Bene, MD, [facility] Emergency Medicine    Derrell Milanes, Arlyss Repress, MD 01/28/22 [phone]

## 2022-01-22 NOTE — H&P (Addendum)
History and Physical    Colin [name] UEA:[id number] DOB: 09/29/1950 DOA: 01/22/2022  PCP: Grayce SessionsEdwards, Michelle P, NP (Confirm with patient/family/NH records and if not entered, this has to be entered at [facility] point of entry) Patient coming from: Home  I have personally briefly reviewed patient's [facility]  Chief Complaint: Feeling tired  HPI: Colin PasturesHuong M Lovin is a 71 y.o. male with medical history significant of CAD with CABG and stenting in the past, ischemic cardiomyopathy with chronic HFrEF LVEF 35%, mild pulmonary hypertension, IDDM with insulin resistance, HLD, sent by family member for evaluation of worsening of generalized weakness and bradycardia.  Symptoms started yesterday patient started to feel generalized weakness denies any muscle or joint pain no chest pain or shortness of breath.  Family reported that patient has had decreased oral intake for the last 2 to 3 days and patient claims poor appetite for last 3 days.  Despite he continues to use his regular dose of insulin at home.  Recently, 5 days ago, patient CHF medication adjusted, he was started on metoprolol 100 mg XL daily to address uncontrolled hypertension, this is in addition to his regular dose of ivabradine.  ED Course: Significant bradycardia with heart rate in the 40s, blood pressure borderline low responded to 250 mL IV bolus and IV.  Heart rate recovered to low 50s and SBP recovered to lower 100s.  Chest x-ray no acute infiltrates.  Blood work showed stable CKD creatinine 2.3 along with baseline 3.1, TSH 5.0, WBC 9.2  Review of Systems: As per HPI otherwise 14 point review of systems negative.    Past Medical History:  Diagnosis Date   Arrhythmia    CAD (coronary artery disease)    stent x 2   Carotid artery occlusion    Diabetes mellitus    Dyslipidemia    Essential hypertension     Past Surgical History:  Procedure Laterality Date   CARDIAC CATHETERIZATION     01/2010 ; 04/2010    CORONARY ARTERY BYPASS GRAFT N/A 08/23/2020   Procedure: CORONARY ARTERY BYPASS GRAFTING (CABG) x 3 ON CARDIOPULMONARY BYPASS USING EVH AND PARTIALLY OPEN HARVESTED LEFT GREATER SAPHENOUS VEIN AND LEFT INTERNAL MAMMARY ARTERY.  LIMA TO LAD, SVG TO OM, SVG TO PDA;  Surgeon: Corliss SkainsLightfoot, Harrell O, MD;  [facility];  Service: Open Heart Surgery;  Laterality: N/A;   RIGHT HEART CATH N/A 12/19/2021   Procedure: RIGHT HEART CATH;  Surgeon: Elder NegusPatwardhan, Manish J, MD;  Location: [facility] INVASIVE CV LAB;  Service: Cardiovascular;  Laterality: N/A;   RIGHT/LEFT HEART CATH AND CORONARY ANGIOGRAPHY N/A 08/22/2020   Procedure: RIGHT/LEFT HEART CATH AND CORONARY ANGIOGRAPHY;  Surgeon: Elder NegusPatwardhan, Manish J, MD;  Location: [facility] INVASIVE CV LAB;  Service: Cardiovascular;  Laterality: N/A;   TEE WITHOUT CARDIOVERSION N/A 08/23/2020   Procedure: TRANSESOPHAGEAL ECHOCARDIOGRAM (TEE);  Surgeon: Corliss SkainsLightfoot, Harrell O, MD;  [facility];  Service: Open Heart Surgery;  Laterality: N/A;   TEMPORARY PACEMAKER N/A 08/22/2020   Procedure: TEMPORARY PACEMAKER;  Surgeon: Elder NegusPatwardhan, Manish J, MD;  Location: [facility] INVASIVE CV LAB;  Service: Cardiovascular;  Laterality: N/A;   VEIN HARVEST Left 08/23/2020   Procedure: ENDOVEIN HARVEST AND PARTIALLY OPEN GREATER SAPHENOUS VEIN HARVESTING;  Surgeon: Corliss SkainsLightfoot, Harrell O, MD;  Location: [facility] OR;  Service: Open Heart Surgery;  Laterality: Left;     reports that he quit smoking about 17 months ago. His smoking use included cigarettes. He has a 12.50 pack-year smoking history. He has  never used smokeless tobacco. He reports that he does not drink alcohol and does not use drugs.  No Known Allergies  Family History  Problem Relation Age of Onset   Diabetes Neg Hx      Prior to Admission medications   Medication Sig Start Date End Date Taking? Authorizing Provider  clopidogrel (PLAVIX) 75 MG tablet Take 1 tablet (75 mg total) by mouth daily. 11/11/20   Patwardhan, Anabel Bene, MD  furosemide  (LASIX) 20 MG tablet Take 3 tablets (60 mg total) by mouth daily. 01/12/22   Hoy Register, MD  insulin isophane & regular human KwikPen (NOVOLIN 70/30 KWIKPEN) (70-30) 100 UNIT/ML KwikPen Inject 30 Units into the skin 2 (two) times daily before a meal. Patient taking differently: Inject 40 Units into the skin daily. 11/15/21   Reather Littler, MD  isosorbide-hydrALAZINE (BIDIL) 20-37.5 MG tablet Take 1 tablet by mouth 3 (three) times daily. 01/11/22   Grayce Sessions, NP  ivabradine (CORLANOR) 5 MG TABS tablet Take 1 tablet (5 mg total) by mouth 2 (two) times daily with a meal. 01/12/22   Hoy Register, MD  metoprolol succinate (TOPROL-XL) 100 MG 24 hr tablet Take 1 tablet (100 mg total) by mouth daily. Take with or immediately following a meal. 01/16/22 04/16/22  Custovic, Rozell Searing, DO  nitroGLYCERIN (NITROSTAT) 0.4 MG SL tablet Place 1 tab (0.4mg  total) under the tongue every 5 minutes as needed for chest pain 11/11/20   Patwardhan, Manish J, MD  pantoprazole (PROTONIX) 20 MG tablet Take 1 tablet (20 mg total) by mouth daily. 11/11/20   Patwardhan, Anabel Bene, MD  rosuvastatin (CRESTOR) 5 MG tablet Take 1 tablet (5 mg total) by mouth daily. 01/12/22   Hoy Register, MD    Physical Exam: Vitals:   01/22/22 1400 01/22/22 1430 01/22/22 1515 01/22/22 1530  BP: (!) 114/47 (!) 110/47 (!) 110/48 (!) 105/46  Pulse: (!) 50 (!) 51 (!) 52 (!) 51  Resp: 11 17 15 14   Temp:      TempSrc:      SpO2: 99% 99% 98% 99%  Weight:      Height:        Constitutional: NAD, calm, comfortable Vitals:   01/22/22 1400 01/22/22 1430 01/22/22 1515 01/22/22 1530  BP: (!) 114/47 (!) 110/47 (!) 110/48 (!) 105/46  Pulse: (!) 50 (!) 51 (!) 52 (!) 51  Resp: 11 17 15 14   Temp:      TempSrc:      SpO2: 99% 99% 98% 99%  Weight:      Height:       Eyes: PERRL, lids and conjunctivae normal ENMT: Mucous membranes are dry. Posterior pharynx clear of any exudate or lesions.Normal dentition.  Neck: normal, supple, no masses,  no thyromegaly Respiratory: clear to auscultation bilaterally, no wheezing, no crackles. Normal respiratory effort. No accessory muscle use.  Cardiovascular: Regular rate and rhythm, no murmurs / rubs / gallops. No extremity edema. 2+ pedal pulses. No carotid bruits.  Abdomen: no tenderness, no masses palpated. No hepatosplenomegaly. Bowel sounds positive.  Musculoskeletal: no clubbing / cyanosis. No joint deformity upper and lower extremities. Good ROM, no contractures. Normal muscle tone.  Skin: no rashes, lesions, ulcers. No induration Neurologic: CN 2-12 grossly intact. Sensation intact, DTR normal. Strength 5/5 in all 4.  Psychiatric: Normal judgment and insight. Alert and oriented x 3. Normal mood.     Labs on Admission: I have personally reviewed following labs and imaging studies  CBC: Recent Labs  Lab 01/22/22  1118 01/22/22 1544  WBC 9.2  --   NEUTROABS 6.9  --   HGB 10.4* 9.5*  HCT 31.5* 28.0*  MCV 94.9  --   PLT 184  --    Basic Metabolic Panel: Recent Labs  Lab 01/22/22 1118 01/22/22 1544  NA 143 141  K 3.8 4.2  CL 106  --   CO2 31  --   GLUCOSE 88  --   BUN 30*  --   CREATININE 2.31*  --   CALCIUM 8.3*  --   MG 2.3  --    GFR: Estimated Creatinine Clearance: 23.6 mL/min (A) (by C-G formula based on SCr of 2.31 mg/dL (H)). Liver Function Tests: Recent Labs  Lab 01/22/22 1118  AST 28  ALT 22  ALKPHOS 57  BILITOT 0.4  PROT 5.2*  ALBUMIN 1.8*   Recent Labs  Lab 01/22/22 1118  LIPASE 28   No results for input(s): "AMMONIA" in the last 168 hours. Coagulation Profile: No results for input(s): "INR", "PROTIME" in the last 168 hours. Cardiac Enzymes: Recent Labs  Lab 01/22/22 1450  CKTOTAL 85   BNP (last 3 results) No results for input(s): "PROBNP" in the last 8760 hours. HbA1C: No results for input(s): "HGBA1C" in the last 72 hours. CBG: No results for input(s): "GLUCAP" in the last 168 hours. Lipid Profile: No results for input(s):  "CHOL", "HDL", "LDLCALC", "TRIG", "CHOLHDL", "LDLDIRECT" in the last 72 hours. Thyroid Function Tests: Recent Labs    01/22/22 1123  TSH 5.058*   Anemia Panel: No results for input(s): "VITAMINB12", "FOLATE", "FERRITIN", "TIBC", "IRON", "RETICCTPCT" in the last 72 hours. Urine analysis:    Component Value Date/Time   COLORURINE YELLOW 12/16/2021 1327   APPEARANCEUR HAZY (A) 12/16/2021 1327   LABSPEC 1.032 (H) 12/16/2021 1327   PHURINE 5.0 12/16/2021 1327   GLUCOSEU >=500 (A) 12/16/2021 1327   HGBUR MODERATE (A) 12/16/2021 1327   BILIRUBINUR NEGATIVE 12/16/2021 1327   BILIRUBINUR negative 05/06/2020 1113   KETONESUR 20 (A) 12/16/2021 1327   PROTEINUR >=300 (A) 12/16/2021 1327   UROBILINOGEN 0.2 05/06/2020 1113   UROBILINOGEN 0.2 01/21/2010 1330   NITRITE NEGATIVE 12/16/2021 1327   LEUKOCYTESUR NEGATIVE 12/16/2021 1327    Radiological Exams on Admission: CT Head Wo Contrast  Result Date: 01/22/2022 CLINICAL DATA:  Mental status changes of unknown cause EXAM: CT HEAD WITHOUT CONTRAST TECHNIQUE: Contiguous axial images were obtained from the base of the skull through the vertex without intravenous contrast. RADIATION DOSE REDUCTION: This exam was performed according to the departmental dose-optimization program which includes automated exposure control, adjustment of the mA and/or kV according to patient size and/or use of iterative reconstruction technique. COMPARISON:  None Available. FINDINGS: Brain: Generalized atrophy. Normal ventricular morphology. No midline shift or mass effect. 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 Otherwise normal appearance of brain parenchyma. No intracranial hemorrhage, mass lesion or evidence of acute infarction. No extra-axial fluid collection. Vascular: Atherosclerotic calcifications of internal carotid arteries at skull base Skull: Intact Sinuses/Orbits: Clear Other: N/A IMPRESSION: Generalized atrophy. 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 No  acute intracranial abnormalities. Electronically Signed   By: Ulyses Southward M.D.   On: 01/22/2022 13:28   DG Chest Portable 1 View  Result Date: 01/22/2022 CLINICAL DATA:  Altered mental status. EXAM: PORTABLE CHEST 1 VIEW COMPARISON:  Chest radiograph November 17, 23. FINDINGS: No consolidation. No visible pleural effusions or pneumothorax. Similar mild enlargement the cardiac silhouette. Median sternotomy. Polyarticular degenerative change. IMPRESSION: No evidence of acute cardiopulmonary  disease. Electronically Signed   By: Feliberto Harts M.D.   On: 01/22/2022 11:45    EKG: Independently reviewed.  Sinus bradycardia, no acute ST changes.  Assessment/Plan Principal Problem:   Bradycardia Active Problems:   Coronary artery disease   Essential hypertension   Type 2 diabetes mellitus with hyperlipidemia (HCC)   HFrEF (heart failure with reduced ejection fraction) (HCC)   Sinus bradycardia   Near syncope  (please populate well all problems here in Problem List. (For example, if patient is on BP meds at home and you resume or decide to hold them, it is a problem that needs to be her. Same for CAD, COPD, HLD and so on)  Symptomatic bradycardia -Likely from his CHF/blood pressure medications, combined effect of metoprolol and ivabradine -Will hold off metoprolol to light heart rate recovers, appears that the patient used to be on bisoprolol 5 mg daily, consider to use bisoprolol or Coreg other than metoprolol for both HTN and rate control.  Dehydration -From poor oral intake related to symptomatic bradycardia -Encourage increased p.o. intake, received a total of 250 mL IV bolus in the ED, given his reduced LVEF, hold off further IV fluids.  Elevated TSH, clinically suspect euthyroid sick syndrome -Elevation, more suspect this is from his chronic illness especially CHF, hold off starting Synthroid, recheck TSH in 6 weeks.  Chronic HFrEF -Volume contraction, hold off diuresis -If blood  pressure stable, resume CHF/BP medication tomorrow -Discontinue metoprolol as above  Severe hypoalbuminemia -Albumin reading continues low <1.5 -UA in November showed 3+ proteinuria -Suspicious for nephrotic syndrome, check protein/creatinine ratio -Other Ddx, check prealbumin  CKD stage IIIb -Creatinine level stable, mild volume contraction, -Significant proteinuria, check protein/creatinine ratio as above  IDDM with insulin resistance -Blood glucose borderline low probably secondary to poor calorie intake, hold off home 70/30 regimen start since sliding scale tonight.  Moderate protein calorie malnutrition -Lost about 5 pounds in the last month, start protein supplement, consult dietitian.  DVT prophylaxis: Heparin subcu Code Status: Full code Family Communication: Left son message Disposition Plan: Expect less than 2 midnight hospital stay Consults called: None Admission status: Tele obs   Emeline General MD [facility] Pager [phone]  01/22/2022, 4:34 PM

## 2022-01-23 DIAGNOSIS — R001 Bradycardia, unspecified: Secondary | ICD-10-CM | POA: Diagnosis not present

## 2022-01-23 LAB — BASIC METABOLIC PANEL
Anion gap: 7 (ref 5–15)
BUN: 28 mg/dL — ABNORMAL HIGH (ref 8–23)
CO2: 25 mmol/L (ref 22–32)
Calcium: 8.1 mg/dL — ABNORMAL LOW (ref 8.9–10.3)
Chloride: 109 mmol/L (ref 98–111)
Creatinine, Ser: 2.14 mg/dL — ABNORMAL HIGH (ref 0.61–1.24)
GFR, Estimated: 32 mL/min — ABNORMAL LOW (ref >60)
Glucose, Bld: 118 mg/dL — ABNORMAL HIGH (ref 70–99)
Potassium: 3.8 mmol/L (ref 3.5–5.1)
Sodium: 141 mmol/L (ref 135–145)

## 2022-01-23 LAB — CBG MONITORING, ED: Glucose-Capillary: 138 mg/dL — ABNORMAL HIGH (ref 70–99)

## 2022-01-23 NOTE — Progress Notes (Signed)
Heart Failure Navigator Progress Note  Assessed for Heart & Vascular TOC clinic readiness.  Patient does not meet criteria due to [facility] cardiology patient.   Navigator will sign off at this time.    Anthone Prieur, BSN, RN Heart Failure Nurse Navigator Secure Chat Only   

## 2022-01-23 NOTE — Discharge Instructions (Addendum)
Stop metoprolol until follow-up with cardiology outpatient

## 2022-01-23 NOTE — Discharge Summary (Signed)
Physician Discharge Summary  Colin [name] [id number] DOB: 04-04-1950 DOA: 01/22/2022  PCP: Colin Perna, NP  Admit date: 01/22/2022 Discharge date: 01/23/2022  Admitted From: Home Disposition: Home  Recommendations for Outpatient Follow-up:  Follow up with PCP in 1-2 weeks Follow-up with cardiology, Dr. Junius Finner in 1-2 weeks Discontinue metoprolol due to symptomatic bradycardia causing weakness Recommend repeat TFTs 6 weeks.  Home Health: No Equipment/Devices: None  Discharge Condition: Stable CODE STATUS: Full code Diet recommendation: Heart healthy diet  History of present illness:  Colin [name] is a 71 y.o. male with medical history significant of CAD with CABG and stenting in the past, ischemic cardiomyopathy with chronic HFrEF LVEF 35%, mild pulmonary hypertension, IDDM with insulin resistance, HLD, sent by family member for evaluation of worsening of generalized weakness and bradycardia. Symptoms started day prior to admission in which patient started to feel generalized weakness denies any muscle or joint pain no chest pain or shortness of breath.  Family reported that patient has had decreased oral intake for the last 2 to 3 days and patient claims poor appetite for last 3 days.  Despite he continues to use his regular dose of insulin at home.  Recently, 5 days ago, patient CHF medication adjusted, he was started on metoprolol 100 mg XL daily to address uncontrolled hypertension, this is in addition to his regular dose of ivabradine.   In the ED, significant bradycardia with heart rate in the 40s, blood pressure borderline low responded to 250 mL IV bolus and IV.  Heart rate recovered to low 50s and SBP recovered to lower 100s.  Chest x-ray no acute infiltrates.  Blood work showed stable CKD creatinine 2.3 along with baseline 3.1, TSH 5.0, WBC 9.2.  TRH was consulted for admission for further evaluation and management of symptomatic bradycardia.  Hospital  course:  Symptomatic bradycardia Patient presenting with weakness, fatigue and notably bradycardic with heart rates in the 40s.  This is in the setting of recent addition of metoprolol succinate 100 mg p.o. daily outpatient.  Also confounded by his use of ivabradine.  Home metoprolol succinate was discontinued and patient was continued on his home ivabradine; with improvement and ultimate resolution of his symptoms.  Recommend continue to discontinue his metoprolol succinate on discharge until follows up with his PCP and/or cardiologist.  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  TSH elevated 5.058.  Recommend repeat TFTs in 6 weeks.  Severe hypoalbuminemia Proteinuria Albumin low less than 1.5 with urinalysis with 3+ protein urea.  Protein creatinine ratio equal to 1, not consistent with nephrotic syndrome.  Outpatient follow-up with PCP, consideration of ambulatory referral to nephrology in the near future given his CKD.  Chronic systolic congestive heart failure, compensated Continue statin, Jardiance, BiDil, furosemide.  Outpatient follow-up with cardiology.  CKD stage IIIb Renal ultrasound with findings consistent with medical renal disease and simple cysts.  Creatinine stable, 2.14 at time of discharge.  Recommend outpatient referral to nephrology for further surveillance.  Type 2 diabetes mellitus Continue NPH.  Moderate protein calorie malnutrition Body mass index is 22.73 kg/m.  Recommend increasing oral intake, supplementation.  Discharge Diagnoses:  Principal Problem:   Bradycardia Active Problems:   Coronary artery disease   Essential hypertension   Type 2 diabetes mellitus with hyperlipidemia (HCC)   HFrEF (heart failure with reduced ejection fraction) (HCC)   Sinus bradycardia   Near syncope    Discharge Instructions  Discharge Instructions     Call MD for:  difficulty breathing,  headache or visual disturbances   Complete by: As directed    Call MD for:   extreme fatigue   Complete by: As directed    Call MD for:  persistant dizziness or light-headedness   Complete by: As directed    Call MD for:  persistant nausea and vomiting   Complete by: As directed    Call MD for:  severe uncontrolled pain   Complete by: As directed    Call MD for:  temperature >100.4   Complete by: As directed    Diet - low sodium heart healthy   Complete by: As directed    Increase activity slowly   Complete by: As directed       Allergies as of 01/23/2022   No Known Allergies      Medication List     STOP taking these medications    metoprolol succinate 100 MG 24 hr tablet Commonly known as: TOPROL-XL       TAKE these medications    clopidogrel 75 MG tablet Commonly known as: Plavix Take 1 tablet (75 mg total) by mouth daily.   furosemide 20 MG tablet Commonly known as: LASIX Take 3 tablets (60 mg total) by mouth daily.   isosorbide-hydrALAZINE 20-37.5 MG tablet Commonly known as: BIDIL Take 1 tablet by mouth 3 (three) times daily.   ivabradine 5 MG Tabs tablet Commonly known as: CORLANOR Take 1 tablet (5 mg total) by mouth 2 (two) times daily with a meal.   Jardiance 25 MG Tabs tablet Generic drug: empagliflozin Take 25 mg by mouth daily.   nitroGLYCERIN 0.4 MG SL tablet Commonly known as: NITROSTAT Place 1 tab (0.4mg  total) under the tongue every 5 minutes as needed for chest pain   NovoLIN 70/30 Kwikpen (70-30) 100 UNIT/ML KwikPen Generic drug: insulin isophane & regular human KwikPen Inject 30 Units into the skin 2 (two) times daily before a meal. What changed:  how much to take when to take this   pantoprazole 20 MG tablet Commonly known as: PROTONIX Take 1 tablet (20 mg total) by mouth daily.   rosuvastatin 5 MG tablet Commonly known as: CRESTOR Take 1 tablet (5 mg total) by mouth daily.        Follow-up Information     Colin Perna, NP. Schedule an appointment as soon as possible for a visit in 1  week(s).   Specialty: Internal Medicine Contact information: [place] [address] [phone]         Colin Mormon, MD. Schedule an appointment as soon as possible for a visit in 1 week(s).   Specialties: Cardiology, Radiology Contact information: [place] [address] [phone]                No Known Allergies  Consultations: None   Procedures/Studies: US RENAL  Result Date: 01/23/2022 CLINICAL DATA:  [id number] AKI (acute kidney injury) ([place]) [id number] EXAM: RENAL / URINARY TRACT ULTRASOUND COMPLETE COMPARISON:  CT abdomen pelvis 12/08/2010, ultrasound renal 12/20/2021 FINDINGS: Right Kidney: Renal measurements: 11.3 x 5.1 x 4.7 cm = volume: 141 mL. Echogenicity increased. Multiple simple renal cysts. No mass or hydronephrosis visualized. Left Kidney: Renal measurements: 10.9 x 5.7 x 5.1 cm 67 = volume: 167 mL. Echogenicity increased. Multiple simple renal cysts. No mass or hydronephrosis visualized. Urinary bladder: Appears normal for degree of bladder distention. Other: None. IMPRESSION: 1. Slightly increased renal echogenicity suggestive of renal parenchymal disease bilaterally. 2. Bilateral simple renal cysts-no  further follow-up indicated. Electronically Signed   By: Tish Frederickson M.D.   On: 01/23/2022 00:38   CT Head Wo Contrast  Result Date: 01/22/2022 CLINICAL DATA:  Mental status changes of unknown cause EXAM: CT HEAD WITHOUT CONTRAST TECHNIQUE: Contiguous axial images were obtained from the base of the skull through the vertex without intravenous contrast. RADIATION DOSE REDUCTION: This exam was performed according to the departmental dose-optimization program which includes automated exposure control, adjustment of the mA and/or kV according to patient size and/or use of iterative reconstruction technique. COMPARISON:  None Available. FINDINGS: Brain: Generalized atrophy. Normal ventricular morphology.  No midline shift or mass effect. 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 Otherwise normal appearance of brain parenchyma. No intracranial hemorrhage, mass lesion or evidence of acute infarction. No extra-axial fluid collection. Vascular: Atherosclerotic calcifications of internal carotid arteries at skull base Skull: Intact Sinuses/Orbits: Clear Other: N/A IMPRESSION: Generalized atrophy. 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 No acute intracranial abnormalities. Electronically Signed   By: Ulyses Southward M.D.   On: 01/22/2022 13:28   DG Chest Portable 1 View  Result Date: 01/22/2022 CLINICAL DATA:  Altered mental status. EXAM: PORTABLE CHEST 1 VIEW COMPARISON:  Chest radiograph November 17, 23. FINDINGS: No consolidation. No visible pleural effusions or pneumothorax. Similar mild enlargement the cardiac silhouette. Median sternotomy. Polyarticular degenerative change. IMPRESSION: No evidence of acute cardiopulmonary disease. Electronically Signed   By: Feliberto Harts M.D.   On: 01/22/2022 11:45     Subjective: Patient seen examined at bedside, resting comfortably.  Lying in bed.  Remains in ED holding area.  Aided in interpretation with video interpreter Nedra Hai [phone].  Patient feels back to baseline, no further weakness and ready for discharge home.  Discussed with patient regarding discontinuation of metoprolol until follows up with PCP and cardiology is likely related to symptomatic bradycardia causing his symptoms.  Patient with no other complaints or concerns at this time.  Denies headache, no dizziness, no chest pain, no palpitations, no shortness of breath, no abdominal pain, no fever/chills/night sweats, no nausea/vomiting/diarrhea, no focal weakness, no fatigue, no paresthesias.  No acute events overnight per nursing staff.  Discharge Exam: Vitals:   01/23/22 0815 01/23/22 0849  BP: 137/72   Pulse: 66   Resp: 19   Temp:  97.8 F (36.6 C)  SpO2: 100%    Vitals:   01/23/22 0600  01/23/22 0615 01/23/22 0815 01/23/22 0849  BP: (!) 153/64 (!) 147/70 137/72   Pulse: 62 (!) 58 66   Resp:  16 19   Temp:    97.8 F (36.6 C)  TempSrc:    Oral  SpO2: 96% 97% 100%   Weight:      Height:        Physical Exam: GEN: NAD, alert and oriented x 3, thin in appearance HEENT: NCAT, PERRL, EOMI, sclera clear, MMM PULM: CTAB w/o wheezes/crackles, normal respiratory effort, on room air CV: Bradycardic, regular rhythm w/o M/G/R GI: abd soft, NTND, NABS, no R/G/M MSK: no peripheral edema, muscle strength globally intact 5/5 bilateral upper/lower extremities NEURO: CN II-XII intact, no focal deficits, sensation to light touch intact PSYCH: normal mood/affect Integumentary: dry/intact, no rashes or wounds    The results of significant diagnostics from this hospitalization (including imaging, microbiology, ancillary and laboratory) are listed below for reference.     Microbiology: Recent Results (from the past 240 hour(s))  Resp panel by RT-PCR (RSV, Flu A&B, Covid) Anterior Nasal Swab     Status: None  Collection Time: 01/22/22 12:40 PM   Specimen: Anterior Nasal Swab  Result Value Ref Range Status   SARS Coronavirus 2 by RT PCR NEGATIVE NEGATIVE Final    Comment: (NOTE) SARS-CoV-2 target nucleic acids are NOT DETECTED.  The SARS-CoV-2 RNA is generally detectable in upper respiratory specimens during the acute phase of infection. The lowest concentration of SARS-CoV-2 viral copies this assay can detect is 138 copies/mL. A negative result does not preclude SARS-Cov-2 infection and should not be used as the sole basis for treatment or other patient management decisions. A negative result may occur with  improper specimen collection/handling, submission of specimen other than nasopharyngeal swab, presence of viral mutation(s) within the areas targeted by this assay, and inadequate number of viral copies(<138 copies/mL). A negative result must be combined with clinical  observations, patient history, and epidemiological information. The expected result is Negative.  Fact Sheet for Patients:  EntrepreneurPulse.com.au  Fact Sheet for Healthcare Providers:  IncredibleEmployment.be  This test is no t yet approved or cleared by the [place] FDA and  has been authorized for detection and/or diagnosis of SARS-CoV-2 by FDA under an Emergency Use Authorization (EUA). This EUA will remain  in effect (meaning this test can be used) for the duration of the COVID-19 declaration under Section 564(b)(1) of the Act, 21 U.S.C.section 360bbb-3(b)(1), unless the authorization is terminated  or revoked sooner.       Influenza A by PCR NEGATIVE NEGATIVE Final   Influenza B by PCR NEGATIVE NEGATIVE Final    Comment: (NOTE) The Xpert Xpress SARS-CoV-2/FLU/RSV plus assay is intended as an aid in the diagnosis of influenza from Nasopharyngeal swab specimens and should not be used as a sole basis for treatment. Nasal washings and aspirates are unacceptable for Xpert Xpress SARS-CoV-2/FLU/RSV testing.  Fact Sheet for Patients: EntrepreneurPulse.com.au  Fact Sheet for Healthcare Providers: IncredibleEmployment.be  This test is not yet approved or cleared by the [place] FDA and has been authorized for detection and/or diagnosis of SARS-CoV-2 by FDA under an Emergency Use Authorization (EUA). This EUA will remain in effect (meaning this test can be used) for the duration of the COVID-19 declaration under Section 564(b)(1) of the Act, 21 U.S.C. section 360bbb-3(b)(1), unless the authorization is terminated or revoked.     Resp Syncytial Virus by PCR NEGATIVE NEGATIVE Final    Comment: (NOTE) Fact Sheet for Patients: EntrepreneurPulse.com.au  Fact Sheet for Healthcare Providers: IncredibleEmployment.be  This test is not yet approved or cleared by  the [place] FDA and has been authorized for detection and/or diagnosis of SARS-CoV-2 by FDA under an Emergency Use Authorization (EUA). This EUA will remain in effect (meaning this test can be used) for the duration of the COVID-19 declaration under Section 564(b)(1) of the Act, 21 U.S.C. section 360bbb-3(b)(1), unless the authorization is terminated or revoked.  Performed at [facility], [place] [address], [place], [place] [address]      Labs: BNP (last 3 results) Recent Labs    12/16/21 1257 01/22/22 1121  BNP 2,479.6* [id number]   Basic Metabolic Panel: Recent Labs  Lab 01/22/22 1118 01/22/22 1544 01/23/22 0500  NA 143 141 141  K 3.8 4.2 3.8  CL 106  --  109  CO2 31  --  25  GLUCOSE 88  --  118*  BUN 30*  --  28*  CREATININE 2.31*  --  2.14*  CALCIUM 8.3*  --  8.1*  MG 2.3  --   --  Liver Function Tests: Recent Labs  Lab 01/22/22 1118  AST 28  ALT 22  ALKPHOS 57  BILITOT 0.4  PROT 5.2*  ALBUMIN 1.8*   Recent Labs  Lab 01/22/22 1118  LIPASE 28   No results for input(s): "AMMONIA" in the last 168 hours. CBC: Recent Labs  Lab 01/22/22 1118 01/22/22 1544  WBC 9.2  --   NEUTROABS 6.9  --   HGB 10.4* 9.5*  HCT 31.5* 28.0*  MCV 94.9  --   PLT 184  --    Cardiac Enzymes: Recent Labs  Lab 01/22/22 1450  CKTOTAL 85   BNP: Invalid input(s): "POCBNP" CBG: Recent Labs  Lab 01/22/22 1647 01/22/22 1716 01/22/22 2144 01/23/22 0820  GLUCAP 64* 84 193* 138*   D-Dimer No results for input(s): "DDIMER" in the last 72 hours. Hgb A1c No results for input(s): "HGBA1C" in the last 72 hours. Lipid Profile No results for input(s): "CHOL", "HDL", "LDLCALC", "TRIG", "CHOLHDL", "LDLDIRECT" in the last 72 hours. Thyroid function studies Recent Labs    01/22/22 1123  TSH 5.058*   Anemia work up No results for input(s): "VITAMINB12", "FOLATE", "FERRITIN", "TIBC", "IRON", "RETICCTPCT" in the last 72 hours. Urinalysis    Component Value  Date/Time   COLORURINE YELLOW 01/22/2022 2146   APPEARANCEUR CLEAR 01/22/2022 2146   LABSPEC 1.021 01/22/2022 2146   PHURINE 7.0 01/22/2022 2146   GLUCOSEU >=500 (A) 01/22/2022 2146   HGBUR NEGATIVE 01/22/2022 2146   BILIRUBINUR NEGATIVE 01/22/2022 2146   BILIRUBINUR negative 05/06/2020 1113   KETONESUR NEGATIVE 01/22/2022 2146   PROTEINUR >=300 (A) 01/22/2022 2146   UROBILINOGEN 0.2 05/06/2020 1113   UROBILINOGEN 0.2 01/21/2010 1330   NITRITE NEGATIVE 01/22/2022 2146   LEUKOCYTESUR NEGATIVE 01/22/2022 2146   Sepsis Labs Recent Labs  Lab 01/22/22 1118  WBC 9.2   Microbiology Recent Results (from the past 240 hour(s))  Resp panel by RT-PCR (RSV, Flu A&B, Covid) Anterior Nasal Swab     Status: None   Collection Time: 01/22/22 12:40 PM   Specimen: Anterior Nasal Swab  Result Value Ref Range Status   SARS Coronavirus 2 by RT PCR NEGATIVE NEGATIVE Final    Comment: (NOTE) SARS-CoV-2 target nucleic acids are NOT DETECTED.  The SARS-CoV-2 RNA is generally detectable in upper respiratory specimens during the acute phase of infection. The lowest concentration of SARS-CoV-2 viral copies this assay can detect is 138 copies/mL. A negative result does not preclude SARS-Cov-2 infection and should not be used as the sole basis for treatment or other patient management decisions. A negative result may occur with  improper specimen collection/handling, submission of specimen other than nasopharyngeal swab, presence of viral mutation(s) within the areas targeted by this assay, and inadequate number of viral copies(<138 copies/mL). A negative result must be combined with clinical observations, patient history, and epidemiological information. The expected result is Negative.  Fact Sheet for Patients:  BloggerCourse.com  Fact Sheet for Healthcare Providers:  SeriousBroker.it  This test is no t yet approved or cleared by the [place]  FDA and  has been authorized for detection and/or diagnosis of SARS-CoV-2 by FDA under an Emergency Use Authorization (EUA). This EUA will remain  in effect (meaning this test can be used) for the duration of the COVID-19 declaration under Section 564(b)(1) of the Act, 21 U.S.C.section 360bbb-3(b)(1), unless the authorization is terminated  or revoked sooner.       Influenza A by PCR NEGATIVE NEGATIVE Final   Influenza B by PCR NEGATIVE NEGATIVE Final  Comment: (NOTE) The Xpert Xpress SARS-CoV-2/FLU/RSV plus assay is intended as an aid in the diagnosis of influenza from Nasopharyngeal swab specimens and should not be used as a sole basis for treatment. Nasal washings and aspirates are unacceptable for Xpert Xpress SARS-CoV-2/FLU/RSV testing.  Fact Sheet for Patients: EntrepreneurPulse.com.au  Fact Sheet for Healthcare Providers: IncredibleEmployment.be  This test is not yet approved or cleared by the [place] FDA and has been authorized for detection and/or diagnosis of SARS-CoV-2 by FDA under an Emergency Use Authorization (EUA). This EUA will remain in effect (meaning this test can be used) for the duration of the COVID-19 declaration under Section 564(b)(1) of the Act, 21 U.S.C. section 360bbb-3(b)(1), unless the authorization is terminated or revoked.     Resp Syncytial Virus by PCR NEGATIVE NEGATIVE Final    Comment: (NOTE) Fact Sheet for Patients: EntrepreneurPulse.com.au  Fact Sheet for Healthcare Providers: IncredibleEmployment.be  This test is not yet approved or cleared by the [place] FDA and has been authorized for detection and/or diagnosis of SARS-CoV-2 by FDA under an Emergency Use Authorization (EUA). This EUA will remain in effect (meaning this test can be used) for the duration of the COVID-19 declaration under Section 564(b)(1) of the Act, 21 U.S.C. section  360bbb-3(b)(1), unless the authorization is terminated or revoked.  Performed at [facility], [place] [address] [place], [place] [address]      Time coordinating discharge: Over 30 minutes  SIGNED:   Vir Whetstine J [place] DO  [facility] 01/23/2022, 9:03 AM

## 2022-01-23 NOTE — ED Notes (Signed)
Discharge instructions discussed with pt. Verbalized understanding. VSS. No questions or concerns regarding discharge  

## 2022-01-24 ENCOUNTER — Telehealth (INDEPENDENT_AMBULATORY_CARE_PROVIDER_SITE_OTHER): Payer: Self-pay

## 2022-01-24 ENCOUNTER — Other Ambulatory Visit: Payer: Self-pay

## 2022-01-24 LAB — URINE CULTURE

## 2022-01-24 NOTE — Telephone Encounter (Signed)
Transition Care Management Unsuccessful Follow-up Telephone Call  Date of discharge and from where:  [facility] 01/23/2022  Attempts:  1st Attempt  Reason for unsuccessful TCM follow-up call:  Left voice message Aliyana Dlugosz, LPN [facility] Nurse Health Advisor Direct Dial [phone]     

## 2022-01-25 ENCOUNTER — Other Ambulatory Visit (INDEPENDENT_AMBULATORY_CARE_PROVIDER_SITE_OTHER): Payer: Self-pay | Admitting: Primary Care

## 2022-01-25 ENCOUNTER — Other Ambulatory Visit: Payer: Self-pay | Admitting: Cardiology

## 2022-01-25 ENCOUNTER — Other Ambulatory Visit: Payer: Self-pay

## 2022-01-25 DIAGNOSIS — I2510 Atherosclerotic heart disease of native coronary artery without angina pectoris: Secondary | ICD-10-CM

## 2022-01-25 DIAGNOSIS — E0842 Diabetes mellitus due to underlying condition with diabetic polyneuropathy: Secondary | ICD-10-CM

## 2022-01-25 NOTE — Telephone Encounter (Signed)
Transition Care Management Unsuccessful Follow-up Telephone Call  Date of discharge and from where:  [facility] 01/23/2022  Attempts:  2nd Attempt  Reason for unsuccessful TCM follow-up call:  Left voice message Yaretzy Olazabal, LPN [facility] Nurse Health Advisor Direct Dial [phone]     

## 2022-01-25 NOTE — Telephone Encounter (Signed)
Medication Refill - Medication: clopidogrel (PLAVIX) 75 MG tablet  rosuvastatin (CRESTOR) 5 MG tablet   Has the patient contacted their pharmacy? Yes.   (Agent: If no, request that the patient contact the pharmacy for the refill. If patient does not wish to contact the pharmacy document the reason why and proceed with request.) (Agent: If yes, when and what did the pharmacy advise?)  Preferred Pharmacy (with phone number or street name):  [facility] Phone: [phone]  Fax: [phone]     Has the patient been seen for an appointment in the last year OR does the patient have an upcoming appointment? Yes.    Agent: Please be advised that RX refills may take up to 3 business days. We ask that you follow-up with your pharmacy.

## 2022-01-26 NOTE — Telephone Encounter (Signed)
Requested medication (s) are due for refill today: yes  Requested medication (s) are on the active medication list: yes  Last refill:  11/11/20 #90/3  Future visit scheduled: no  Notes to clinic:  Unable to refill per protocol, last refill by another provider.    Requested Prescriptions  Pending Prescriptions Disp Refills   clopidogrel (PLAVIX) 75 MG tablet 90 tablet 3    Sig: Take 1 tablet (75 mg total) by mouth daily.     Hematology: Antiplatelets - clopidogrel Failed - 01/25/2022  3:00 PM      Failed - HCT in normal range and within 180 days    HCT  Date Value Ref Range Status  01/22/2022 28.0 (L) 39.0 - 52.0 % Final   Hematocrit  Date Value Ref Range Status  11/11/2020 41.1 37.5 - 51.0 % Final         Failed - HGB in normal range and within 180 days    Hemoglobin  Date Value Ref Range Status  01/22/2022 9.5 (L) 13.0 - 17.0 g/dL Final  93/81/8299 37.1 13.0 - 17.7 g/dL Final   Total hemoglobin  Date Value Ref Range Status  08/28/2020 8.4 (L) 12.0 - 16.0 g/dL Final         Failed - Cr in normal range and within 360 days    Creatinine, Ser  Date Value Ref Range Status  01/23/2022 2.14 (H) 0.61 - 1.24 mg/dL Final   Creatinine,U  Date Value Ref Range Status  06/29/2021 59.3 mg/dL Final   Creatinine, Urine  Date Value Ref Range Status  01/22/2022 84 mg/dL Final         Passed - PLT in normal range and within 180 days    Platelets  Date Value Ref Range Status  01/22/2022 184 150 - 400 K/uL Final  11/11/2020 213 150 - 450 x10E3/uL Final         Passed - Valid encounter within last 6 months    Recent Outpatient Visits           2 weeks ago Hospital discharge follow-up   [facility] Grayce Sessions, NP   1 year ago Type 2 diabetes mellitus with diabetic polyneuropathy, with long-term current use of insulin (HCC)   [facility] Grayce Sessions, NP   1 year ago Type 2 diabetes mellitus with diabetic  polyneuropathy, with long-term current use of insulin (HCC)   [facility] Grayce Sessions, NP   1 year ago Type 2 diabetes mellitus without complication, with long-term current use of insulin (HCC)   [place] [place], Cornelius Moras, RPH-CPP   2 years ago Type 2 diabetes mellitus without complication, with long-term current use of insulin (HCC)   [facility] Grayce Sessions, NP       Future Appointments             In 1 week Custovic, Rozell Searing, DO [facility]            Refused Prescriptions Disp Refills   rosuvastatin (CRESTOR) 5 MG tablet 30 tablet 3    Sig: Take 1 tablet (5 mg total) by mouth daily.     Cardiovascular:  Antilipid - Statins 2 Failed - 01/25/2022  3:00 PM      Failed - Cr in normal range and within 360 days    Creatinine, Ser  Date Value Ref Range Status  01/23/2022  2.14 (H) 0.61 - 1.24 mg/dL Final   Creatinine,U  Date Value Ref Range Status  06/29/2021 59.3 mg/dL Final   Creatinine, Urine  Date Value Ref Range Status  01/22/2022 84 mg/dL Final         Failed - Lipid Panel in normal range within the last 12 months    Cholesterol, Total  Date Value Ref Range Status  11/11/2020 279 (H) 100 - 199 mg/dL Final   Cholesterol  Date Value Ref Range Status  11/08/2021 305 (H) 0 - 200 mg/dL Final    Comment:    ATP III Classification       Desirable:  < 200 mg/dL               Borderline High:  200 - 239 mg/dL          High:  > = 793 mg/dL   LDL Chol Calc (NIH)  Date Value Ref Range Status  11/11/2020 207 (H) 0 - 99 mg/dL Final   LDL Cholesterol  Date Value Ref Range Status  11/08/2021 225 (H) 0 - 99 mg/dL Final   Direct LDL  Date Value Ref Range Status  08/21/2020 137.1 (H) 0 - 99 mg/dL Final    Comment:    Performed at [facility], [address] [place], [place] [address]   HDL  Date Value Ref Range Status  11/08/2021 62.70 >39.00  mg/dL Final  92/33/0076 61 >22 mg/dL Final   Triglycerides  Date Value Ref Range Status  11/08/2021 84.0 0.0 - 149.0 mg/dL Final    Comment:    Normal:  <150 mg/dLBorderline High:  150 - 199 mg/dL         Passed - Patient is not pregnant      Passed - Valid encounter within last 12 months    Recent Outpatient Visits           2 weeks ago Hospital discharge follow-up   [facility] Grayce Sessions, NP   1 year ago Type 2 diabetes mellitus with diabetic polyneuropathy, with long-term current use of insulin (HCC)   [facility] Grayce Sessions, NP   1 year ago Type 2 diabetes mellitus with diabetic polyneuropathy, with long-term current use of insulin (HCC)   [facility] Grayce Sessions, NP   1 year ago Type 2 diabetes mellitus without complication, with long-term current use of insulin (HCC)   [facility] [place], Jeannett Senior L, RPH-CPP   2 years ago Type 2 diabetes mellitus without complication, with long-term current use of insulin (HCC)   [facility] Grayce Sessions, NP       Future Appointments             In 1 week Custovic, Rozell Searing, DO [facility]

## 2022-01-26 NOTE — Telephone Encounter (Signed)
Requested medication (s) are due for refill today: Yes  Requested medication (s) are on the active medication list: No  Last refill:  11/02/20  Future visit scheduled: No  Notes to clinic:  Unable to refill per protocol, discontinued at discharge on 12/24/21 from hospital, routing to provider for approval     Requested Prescriptions  Pending Prescriptions Disp Refills   gabapentin (NEURONTIN) 100 MG capsule 270 capsule 1    Sig: TAKE 1 CAPSULE (100 MG TOTAL) BY MOUTH 3 (THREE) TIMES DAILY.     Neurology: Anticonvulsants - gabapentin Failed - 01/25/2022  1:57 PM      Failed - Cr in normal range and within 360 days    Creatinine, Ser  Date Value Ref Range Status  01/23/2022 2.14 (H) 0.61 - 1.24 mg/dL Final   Creatinine,U  Date Value Ref Range Status  06/29/2021 59.3 mg/dL Final   Creatinine, Urine  Date Value Ref Range Status  01/22/2022 84 mg/dL Final         Passed - Completed PHQ-2 or PHQ-9 in the last 360 days      Passed - Valid encounter within last 12 months    Recent Outpatient Visits           2 weeks ago Hospital discharge follow-up   [facility] Grayce Sessions, NP   1 year ago Type 2 diabetes mellitus with diabetic polyneuropathy, with long-term current use of insulin (HCC)   [facility] Grayce Sessions, NP   1 year ago Type 2 diabetes mellitus with diabetic polyneuropathy, with long-term current use of insulin (HCC)   [facility] Grayce Sessions, NP   1 year ago Type 2 diabetes mellitus without complication, with long-term current use of insulin (HCC)   [facility] [place], Jeannett Senior L, RPH-CPP   2 years ago Type 2 diabetes mellitus without complication, with long-term current use of insulin (HCC)   [facility] Grayce Sessions, NP       Future Appointments             In 1 week Custovic, Rozell Searing, DO [facility]

## 2022-01-26 NOTE — Telephone Encounter (Signed)
Should he be on this? Another MD stopped this?

## 2022-01-26 NOTE — Telephone Encounter (Signed)
Requested Prescriptions  Pending Prescriptions Disp Refills   clopidogrel (PLAVIX) 75 MG tablet 90 tablet 3    Sig: Take 1 tablet (75 mg total) by mouth daily.     Hematology: Antiplatelets - clopidogrel Failed - 01/25/2022  3:00 PM      Failed - HCT in normal range and within 180 days    HCT  Date Value Ref Range Status  01/22/2022 28.0 (L) 39.0 - 52.0 % Final   Hematocrit  Date Value Ref Range Status  11/11/2020 41.1 37.5 - 51.0 % Final         Failed - HGB in normal range and within 180 days    Hemoglobin  Date Value Ref Range Status  01/22/2022 9.5 (L) 13.0 - 17.0 g/dL Final  55/73/2202 54.2 13.0 - 17.7 g/dL Final   Total hemoglobin  Date Value Ref Range Status  08/28/2020 8.4 (L) 12.0 - 16.0 g/dL Final         Failed - Cr in normal range and within 360 days    Creatinine, Ser  Date Value Ref Range Status  01/23/2022 2.14 (H) 0.61 - 1.24 mg/dL Final   Creatinine,U  Date Value Ref Range Status  06/29/2021 59.3 mg/dL Final   Creatinine, Urine  Date Value Ref Range Status  01/22/2022 84 mg/dL Final         Passed - PLT in normal range and within 180 days    Platelets  Date Value Ref Range Status  01/22/2022 184 150 - 400 K/uL Final  11/11/2020 213 150 - 450 x10E3/uL Final         Passed - Valid encounter within last 6 months    Recent Outpatient Visits           2 weeks ago Hospital discharge follow-up   [facility] Grayce Sessions, NP   1 year ago Type 2 diabetes mellitus with diabetic polyneuropathy, with long-term current use of insulin (HCC)   [facility] Grayce Sessions, NP   1 year ago Type 2 diabetes mellitus with diabetic polyneuropathy, with long-term current use of insulin (HCC)   [facility] Grayce Sessions, NP   1 year ago Type 2 diabetes mellitus without complication, with long-term current use of insulin (HCC)   [facility] [place], Cornelius Moras, RPH-CPP   2 years ago Type 2 diabetes mellitus without complication, with long-term current use of insulin (HCC)   [facility] Grayce Sessions, NP       Future Appointments             In 1 week Custovic, Rozell Searing, DO [facility]            Refused Prescriptions Disp Refills   rosuvastatin (CRESTOR) 5 MG tablet 30 tablet 3    Sig: Take 1 tablet (5 mg total) by mouth daily.     Cardiovascular:  Antilipid - Statins 2 Failed - 01/25/2022  3:00 PM      Failed - Cr in normal range and within 360 days    Creatinine, Ser  Date Value Ref Range Status  01/23/2022 2.14 (H) 0.61 - 1.24 mg/dL Final   Creatinine,U  Date Value Ref Range Status  06/29/2021 59.3 mg/dL Final   Creatinine, Urine  Date Value Ref Range Status  01/22/2022 84 mg/dL Final         Failed - Lipid Panel in  normal range within the last 12 months    Cholesterol, Total  Date Value Ref Range Status  11/11/2020 279 (H) 100 - 199 mg/dL Final   Cholesterol  Date Value Ref Range Status  11/08/2021 305 (H) 0 - 200 mg/dL Final    Comment:    ATP III Classification       Desirable:  < 200 mg/dL               Borderline High:  200 - 239 mg/dL          High:  > = 409 mg/dL   LDL Chol Calc (NIH)  Date Value Ref Range Status  11/11/2020 207 (H) 0 - 99 mg/dL Final   LDL Cholesterol  Date Value Ref Range Status  11/08/2021 225 (H) 0 - 99 mg/dL Final   Direct LDL  Date Value Ref Range Status  08/21/2020 137.1 (H) 0 - 99 mg/dL Final    Comment:    Performed at [facility], [address] [place], [place] [address]   HDL  Date Value Ref Range Status  11/08/2021 62.70 >39.00 mg/dL Final  47/82/9562 61 >13 mg/dL Final   Triglycerides  Date Value Ref Range Status  11/08/2021 84.0 0.0 - 149.0 mg/dL Final    Comment:    Normal:  <150 mg/dLBorderline High:  150 - 199 mg/dL         Passed - Patient is not pregnant      Passed - Valid  encounter within last 12 months    Recent Outpatient Visits           2 weeks ago Hospital discharge follow-up   [facility] Grayce Sessions, NP   1 year ago Type 2 diabetes mellitus with diabetic polyneuropathy, with long-term current use of insulin (HCC)   [facility] Grayce Sessions, NP   1 year ago Type 2 diabetes mellitus with diabetic polyneuropathy, with long-term current use of insulin (HCC)   [facility] Grayce Sessions, NP   1 year ago Type 2 diabetes mellitus without complication, with long-term current use of insulin (HCC)   [facility] [place], Jeannett Senior L, RPH-CPP   2 years ago Type 2 diabetes mellitus without complication, with long-term current use of insulin (HCC)   [facility] Grayce Sessions, NP       Future Appointments             In 1 week Custovic, Rozell Searing, DO [facility]

## 2022-01-31 ENCOUNTER — Other Ambulatory Visit (INDEPENDENT_AMBULATORY_CARE_PROVIDER_SITE_OTHER): Payer: Self-pay | Admitting: Primary Care

## 2022-01-31 ENCOUNTER — Other Ambulatory Visit: Payer: Self-pay

## 2022-01-31 DIAGNOSIS — E0842 Diabetes mellitus due to underlying condition with diabetic polyneuropathy: Secondary | ICD-10-CM

## 2022-01-31 DIAGNOSIS — I2510 Atherosclerotic heart disease of native coronary artery without angina pectoris: Secondary | ICD-10-CM

## 2022-01-31 NOTE — Telephone Encounter (Signed)
Transition Care Management Unsuccessful Follow-up Telephone Call  Date of discharge and from where:  [facility] 01/23/2022  Attempts:  3rd Attempt  Reason for unsuccessful TCM follow-up call:  Left voice message Shimshon Narula, LPN [facility] Nurse Health Advisor Direct Dial [phone]     

## 2022-01-31 NOTE — Telephone Encounter (Signed)
No just baby aspirin

## 2022-01-31 NOTE — Telephone Encounter (Signed)
[place] RN [facility]  Best contact: [phone]  Medication Refill - Medication: clopidogrel (PLAVIX) 75 MG tablet  rosuvastatin (CRESTOR) 5 MG tablet  pantoprazole (PROTONIX) 20 MG tablet   gabapentin (NEURONTIN) 100 MG capsule  Has the patient contacted their pharmacy? Yes.   (Agent: If no, request that the patient contact the pharmacy for the refill. If patient does not wish to contact the pharmacy document the reason why and proceed with request.) (Agent: If yes, when and what did the pharmacy advise?)  Preferred Pharmacy (with phone number or street name):  [facility]  [address] [place] [address]  Phone: [phone] Fax: [phone]   Has the patient been seen for an appointment in the last year OR does the patient have an upcoming appointment? Yes.    Agent: Please be advised that RX refills may take up to 3 business days. We ask that you follow-up with your pharmacy.

## 2022-02-01 ENCOUNTER — Telehealth: Payer: Self-pay

## 2022-02-01 NOTE — Telephone Encounter (Signed)
Given VO to [place] for SW.

## 2022-02-01 NOTE — Telephone Encounter (Signed)
[place] prior auth submitted to ins today via CoverMyMeds Key: [id number]

## 2022-02-01 NOTE — Telephone Encounter (Signed)
Home Health Verbal Orders - Caller/Agency: Enrique Sack from [facility]   Callback Number: [phone] Requesting Social Worker Frequency: ?

## 2022-02-02 NOTE — Telephone Encounter (Signed)
Requested medication (s) are due for refill today: expired medications , discontinued medication  Requested medication (s) are on the active medication list: plavix, crestor, protonix on active med list, neurontin not on active med list   Last refill:  11/11/20 #90 3 refills class : print  plavix, protonix. Crestor - 01/12/22 #30 3 refills.   Future visit scheduled: no   Notes to clinic:   expired medications - plavix, protonix. Do you wan to renew Rx? Crestor protocol not met last labs 01/23/22. Do you want to refill Rx? Neurontin DC 02/12/20. Do you want to reorder?     Requested Prescriptions  Pending Prescriptions Disp Refills   clopidogrel (PLAVIX) 75 MG tablet 90 tablet 3    Sig: Take 1 tablet (75 mg total) by mouth daily.     Hematology: Antiplatelets - clopidogrel Failed - 01/31/2022  3:39 PM      Failed - HCT in normal range and within 180 days    HCT  Date Value Ref Range Status  01/22/2022 28.0 (L) 39.0 - 52.0 % Final   Hematocrit  Date Value Ref Range Status  11/11/2020 41.1 37.5 - 51.0 % Final         Failed - HGB in normal range and within 180 days    Hemoglobin  Date Value Ref Range Status  01/22/2022 9.5 (L) 13.0 - 17.0 g/dL Final  11/11/2020 13.8 13.0 - 17.7 g/dL Final   Total hemoglobin  Date Value Ref Range Status  08/28/2020 8.4 (L) 12.0 - 16.0 g/dL Final         Failed - Cr in normal range and within 360 days    Creatinine, Ser  Date Value Ref Range Status  01/23/2022 2.14 (H) 0.61 - 1.24 mg/dL Final   Creatinine,U  Date Value Ref Range Status  06/29/2021 59.3 mg/dL Final   Creatinine, Urine  Date Value Ref Range Status  01/22/2022 84 mg/dL Final         Passed - PLT in normal range and within 180 days    Platelets  Date Value Ref Range Status  01/22/2022 184 150 - 400 K/uL Final  11/11/2020 213 150 - 450 x10E3/uL Final         Passed - Valid encounter within last 6 months    Recent Outpatient Visits           3 weeks ago Hospital  discharge follow-up   [place], Michelle P, NP   1 year ago Type 2 diabetes mellitus with diabetic polyneuropathy, with long-term current use of insulin ([place])   [place] [facility] Kerin Perna, NP   1 year ago Type 2 diabetes mellitus with diabetic polyneuropathy, with long-term current use of insulin ([place])   [place] [facility] Kerin Perna, NP   1 year ago Type 2 diabetes mellitus without complication, with long-term current use of insulin ([place])   [place], Jarome Matin, RPH-CPP   2 years ago Type 2 diabetes mellitus without complication, with long-term current use of insulin ([place])   [place] [facility] Juluis Mire P, NP               rosuvastatin (CRESTOR) 5 MG tablet 30 tablet 3    Sig: Take 1 tablet (5 mg total) by mouth daily.     Cardiovascular:  Antilipid - Statins 2 Failed - 01/31/2022  3:39 PM  Failed - Cr in normal range and within 360 days    Creatinine, Ser  Date Value Ref Range Status  01/23/2022 2.14 (H) 0.61 - 1.24 mg/dL Final   Creatinine,U  Date Value Ref Range Status  06/29/2021 59.3 mg/dL Final   Creatinine, Urine  Date Value Ref Range Status  01/22/2022 84 mg/dL Final         Failed - Lipid Panel in normal range within the last 12 months    Cholesterol, Total  Date Value Ref Range Status  11/11/2020 279 (H) 100 - 199 mg/dL Final   Cholesterol  Date Value Ref Range Status  11/08/2021 305 (H) 0 - 200 mg/dL Final    Comment:    ATP III Classification       Desirable:  < 200 mg/dL               Borderline High:  200 - 239 mg/dL          High:  > = 240 mg/dL   LDL Chol Calc (NIH)  Date Value Ref Range Status  11/11/2020 207 (H) 0 - 99 mg/dL Final   LDL Cholesterol  Date Value Ref Range Status  11/08/2021 225 (H) 0 - 99 mg/dL Final   Direct LDL  Date Value Ref Range Status  08/21/2020 137.1 (H) 0 - 99  mg/dL Final    Comment:    Performed at [facility], [place] [address] [place], [place] [address]   HDL  Date Value Ref Range Status  11/08/2021 62.70 >39.00 mg/dL Final  11/11/2020 61 >39 mg/dL Final   Triglycerides  Date Value Ref Range Status  11/08/2021 84.0 0.0 - 149.0 mg/dL Final    Comment:    Normal:  <150 mg/dLBorderline High:  150 - 199 mg/dL         Passed - Patient is not pregnant      Passed - Valid encounter within last 12 months    Recent Outpatient Visits           3 weeks ago Hospital discharge follow-up   [place] Kerin Perna, NP   1 year ago Type 2 diabetes mellitus with diabetic polyneuropathy, with long-term current use of insulin ([place])   [place] [facility] Kerin Perna, NP   1 year ago Type 2 diabetes mellitus with diabetic polyneuropathy, with long-term current use of insulin ([place])   [place] [facility] Kerin Perna, NP   1 year ago Type 2 diabetes mellitus without complication, with long-term current use of insulin ([place])   [place], Jarome Matin, RPH-CPP   2 years ago Type 2 diabetes mellitus without complication, with long-term current use of insulin ([place])   [place] [facility] Juluis Mire P, NP               pantoprazole (PROTONIX) 20 MG tablet 90 tablet 3    Sig: Take 1 tablet (20 mg total) by mouth daily.     Gastroenterology: Proton Pump Inhibitors Passed - 01/31/2022  3:39 PM      Passed - Valid encounter within last 12 months    Recent Outpatient Visits           3 weeks ago Hospital discharge follow-up   [place] Kerin Perna, NP   1 year ago Type 2 diabetes mellitus with diabetic polyneuropathy, with long-term current use  of insulin ([place])   [place] [facility] Kerin Perna, NP   1 year ago Type 2 diabetes mellitus with diabetic  polyneuropathy, with long-term current use of insulin ([place])   [place] [facility] Kerin Perna, NP   1 year ago Type 2 diabetes mellitus without complication, with long-term current use of insulin ([place])   [place], Jarome Matin, RPH-CPP   2 years ago Type 2 diabetes mellitus without complication, with long-term current use of insulin ([place])   [place] [facility] Juluis Mire P, NP               gabapentin (NEURONTIN) 100 MG capsule 90 capsule 3    Sig: Take 1 capsule (100 mg total) by mouth 3 (three) times daily.     Neurology: Anticonvulsants - gabapentin Failed - 01/31/2022  3:39 PM      Failed - Cr in normal range and within 360 days    Creatinine, Ser  Date Value Ref Range Status  01/23/2022 2.14 (H) 0.61 - 1.24 mg/dL Final   Creatinine,U  Date Value Ref Range Status  06/29/2021 59.3 mg/dL Final   Creatinine, Urine  Date Value Ref Range Status  01/22/2022 84 mg/dL Final         Passed - Completed PHQ-2 or PHQ-9 in the last 360 days      Passed - Valid encounter within last 12 months    Recent Outpatient Visits           3 weeks ago Hospital discharge follow-up   [place], Michelle P, NP   1 year ago Type 2 diabetes mellitus with diabetic polyneuropathy, with long-term current use of insulin ([place])   [place] [facility] Kerin Perna, NP   1 year ago Type 2 diabetes mellitus with diabetic polyneuropathy, with long-term current use of insulin ([place])   [place] [facility] Kerin Perna, NP   1 year ago Type 2 diabetes mellitus without complication, with long-term current use of insulin ([place])   [place], Annie Main L, RPH-CPP   2 years ago Type 2 diabetes mellitus without complication, with long-term current use of insulin ([place])   [place] Kerin Perna, NP

## 2022-02-06 ENCOUNTER — Encounter: Payer: Self-pay | Admitting: Internal Medicine

## 2022-02-06 ENCOUNTER — Other Ambulatory Visit: Payer: Self-pay

## 2022-02-06 ENCOUNTER — Ambulatory Visit: Payer: 59 | Admitting: Internal Medicine

## 2022-02-06 VITALS — BP 183/87 | HR 72 | Resp 16 | Ht 63.0 in | Wt 139.0 lb

## 2022-02-06 DIAGNOSIS — I2510 Atherosclerotic heart disease of native coronary artery without angina pectoris: Secondary | ICD-10-CM

## 2022-02-06 DIAGNOSIS — I10 Essential (primary) hypertension: Secondary | ICD-10-CM

## 2022-02-06 DIAGNOSIS — E1165 Type 2 diabetes mellitus with hyperglycemia: Secondary | ICD-10-CM

## 2022-02-06 DIAGNOSIS — I5020 Unspecified systolic (congestive) heart failure: Secondary | ICD-10-CM

## 2022-02-06 MED ORDER — FREESTYLE LIBRE 3 READER DEVI
1.0000 | 0 refills | Status: AC | PRN
  Filled 2022-02-06 – 2022-06-29 (×2): qty 1, 30d supply, fill #0

## 2022-02-06 MED ORDER — FREESTYLE LIBRE 3 SENSOR MISC
3 refills | Status: DC
  Filled 2022-02-06: qty 6, 84d supply, fill #0
  Filled 2022-06-29: qty 2, 28d supply, fill #0

## 2022-02-06 NOTE — Progress Notes (Signed)
Patient referred by Kerin Perna, NP for CAD  Subjective:   Colin [name], male    DOB: 20-Aug-1950, 72 y.o.   MRN: [id number]  Son is at appointment translating.   Chief Complaint  Patient presents with   Hypertension   Hospitalization Follow-up    Hypertension Pertinent negatives include no chest pain or palpitations.   72 year old [name] speaking patient with uncontrolled type 2 diabetes mellitus, mixed hyperlipidemia, tobacco dependence, coronary artery disease with prior MI, LCX and RCA stents in 2012, now with CABG X 3.  LIMA LAD, RSVG PDA, OM1  (08/23/2020), HFrEF  Patient is here for a follow-up appointment. Son is with him translating. Patient is frequently going to the ED with low BP and then he comes to office for follow-up with veery high blood pressures. We will need to reconcile his medications and have only one provider making changes. His son does not live with them and he has his own family to take care of. The patient lives with his wife and other son but other son has mental health issues and is unable to help out. Home health nurses are coming to the house but patient is unable to keep up with medication changes. He needs to reschedule and bring all of his medication bottles into the office. Patient denies chest pain, shortness of breath, palpitations, diaphoresis, syncope, edema, PND, orthopnea.    Past Medical History:  Diagnosis Date   Arrhythmia    CAD (coronary artery disease)    stent x 2   Carotid artery occlusion    Diabetes mellitus    Dyslipidemia    Essential hypertension      Past Surgical History:  Procedure Laterality Date   CARDIAC CATHETERIZATION     01/2010 ; 04/2010   CORONARY ARTERY BYPASS GRAFT N/A 08/23/2020   Procedure: CORONARY ARTERY BYPASS GRAFTING (CABG) x 3 ON CARDIOPULMONARY BYPASS USING EVH AND PARTIALLY OPEN HARVESTED LEFT GREATER SAPHENOUS VEIN AND LEFT INTERNAL MAMMARY ARTERY.  LIMA TO LAD, SVG TO OM, SVG TO PDA;   Surgeon: Lajuana Matte, MD;  Location: [facility];  Service: Open Heart Surgery;  Laterality: N/A;   RIGHT HEART CATH N/A 12/19/2021   Procedure: RIGHT HEART CATH;  Surgeon: Nigel Mormon, MD;  Location: [facility];  Service: Cardiovascular;  Laterality: N/A;   RIGHT/LEFT HEART CATH AND CORONARY ANGIOGRAPHY N/A 08/22/2020   Procedure: RIGHT/LEFT HEART CATH AND CORONARY ANGIOGRAPHY;  Surgeon: Nigel Mormon, MD;  Location: [facility];  Service: Cardiovascular;  Laterality: N/A;   TEE WITHOUT CARDIOVERSION N/A 08/23/2020   Procedure: TRANSESOPHAGEAL ECHOCARDIOGRAM (TEE);  Surgeon: Lajuana Matte, MD;  Location: [facility];  Service: Open Heart Surgery;  Laterality: N/A;   TEMPORARY PACEMAKER N/A 08/22/2020   Procedure: TEMPORARY PACEMAKER;  Surgeon: Nigel Mormon, MD;  Location: [facility];  Service: Cardiovascular;  Laterality: N/A;   VEIN HARVEST Left 08/23/2020   Procedure: ENDOVEIN HARVEST AND PARTIALLY OPEN GREATER SAPHENOUS VEIN HARVESTING;  Surgeon: Lajuana Matte, MD;  Location: [place];  Service: Open Heart Surgery;  Laterality: Left;     Social History   Tobacco Use  Smoking Status Former   Packs/day: 0.25   Years: 50.00   Total pack years: 12.50   Types: Cigarettes   Quit date: 08/2020   Years since quitting: 1.5  Smokeless Tobacco Never    Social History   Substance and Sexual Activity  Alcohol Use No  Family History  Problem Relation Age of Onset   Diabetes Neg Hx      Current Outpatient Medications on File Prior to Visit  Medication Sig Dispense Refill   clopidogrel (PLAVIX) 75 MG tablet Take 1 tablet (75 mg total) by mouth daily. 90 tablet 3   empagliflozin (JARDIANCE) 25 MG TABS tablet Take 25 mg by mouth daily.     furosemide (LASIX) 20 MG tablet Take 3 tablets (60 mg total) by mouth daily. 90 tablet 2   insulin isophane & regular human KwikPen (NOVOLIN 70/30 KWIKPEN) (70-30) 100 UNIT/ML KwikPen Inject 30  Units into the skin 2 (two) times daily before a meal. (Patient taking differently: Inject 40 Units into the skin daily.) 30 mL 3   isosorbide-hydrALAZINE (BIDIL) 20-37.5 MG tablet Take 1 tablet by mouth 3 (three) times daily. 90 tablet 0   ivabradine (CORLANOR) 5 MG TABS tablet Take 1 tablet (5 mg total) by mouth 2 (two) times daily with a meal. 60 tablet 0   nitroGLYCERIN (NITROSTAT) 0.4 MG SL tablet Place 1 tab (0.57m total) under the tongue every 5 minutes as needed for chest pain 30 tablet 3   pantoprazole (PROTONIX) 20 MG tablet Take 1 tablet (20 mg total) by mouth daily. 90 tablet 3   rosuvastatin (CRESTOR) 5 MG tablet Take 1 tablet (5 mg total) by mouth daily. 30 tablet 3   No current facility-administered medications on file prior to visit.    Cardiovascular and other pertinent studies:  EKG 08/29/2020: Sinus rhythm 80 bpm Anteroseptal and inferior infarct, age indeterminate    Op note 08/23/2020 (Dr. LKipp Brood: CABG X 3.  LIMA LAD, RSVG PDA, OM1     Coronary angiography 08/22/2020: LM: Minimal disease LAD: Prox-mid long 80% stenosis Lcx: Lcx/OM1 95% ISR RCA: Prox 80% ISR, followed by In-stent CTO mid-distal vessel Left-to-right collaterals to distal vessel Mildly decompensated ischemic cardiomyopathy Temp pacer placed through RIJ   Surgical consult for CABG   Echocardiogram 08/20/2020:  1. Left ventricular ejection fraction, by estimation, is 30 to 35%. The  left ventricle has moderate to severely decreased function. The left  ventricle demonstrates global hypokinesis. The left ventricular internal  cavity size was mildly dilated. Left  ventricular diastolic parameters are consistent with Grade II diastolic  dysfunction (pseudonormalization). Elevated left atrial pressure.   2. Right ventricular systolic function is mildly reduced. The right  ventricular size is normal.   3. Left atrial size was mildly dilated.   4. The mitral valve is degenerative. Moderate mitral  valve regurgitation.  No evidence of mitral stenosis.   5. The aortic valve is tricuspid. Aortic valve regurgitation is not  visualized. Mild aortic valve sclerosis is present, with no evidence of  aortic valve stenosis.   6. The inferior vena cava is normal in size with greater than 50%  respiratory variability, suggesting right atrial pressure of 3 mmHg.   7. Evidence of atrial level shunting detected by color flow Doppler.     12/17/2021   ECHOCARDIOGRAM REPORT    1. Left ventricular ejection fraction, by estimation, is 30 to 35%. Left ventricular ejection fraction by 2D MOD biplane is 31.3 %. The left ventricle has moderately decreased function. The left ventricle demonstrates global hypokinesis. Left ventricular diastolic parameters are consistent with Grade III diastolic dysfunction (restrictive). Elevated left ventricular end-diastolic pressure.  2. Right ventricular systolic function is mildly reduced. The right ventricular size is normal. There is normal pulmonary artery systolic pressure. The estimated right ventricular systolic pressure  is 26.4 mmHg.  3. Left atrial size was moderately dilated.  4. The mitral valve is abnormal. Moderate to severe mitral valve regurgitation.  5. The aortic valve is tricuspid. Aortic valve regurgitation is not visualized. Aortic valve sclerosis/calcification is present, without any evidence of aortic stenosis.  6. The inferior vena cava is normal in size with greater than 50% respiratory variability, suggesting right atrial pressure of 3 mmHg.  7. Evidence of atrial level shunting detected by color flow Doppler. There is a small patent foramen ovale with predominantly left to right shunting across the atrial septum.     12/19/2021 right heart catheterization RA: 5 mmHg RV: 45/0 mmHg PA: 51/11 mmHg, mPAP 27 mmHg PCW: 12 mmHg   CO: 3.8 L/min CI: 2.2 L/min/m2   Please note that patient was on HFNC during the procedure owing to  hypoxia.   Fairly compensated ischemic cardiomyopathy Mild PH, WHO Grp II     Recent labs: 08/29/2020: Glucose 199, BUN/Cr 37/1.34. EGFR 57. Na/K 137/4.4. Albumin 2.6. Rest of the CMP normal H/H 11.5/35,3. MCV 92.9. Platelets 353 HbA1C 8.4% Chol 187, TG 53, HDL 40, LDL 137 TSH N/A    Review of Systems  Cardiovascular:  Negative for chest pain, dyspnea on exertion, leg swelling, palpitations and syncope.         Vitals:   02/06/22 1104 02/06/22 1108  BP: (!) 180/82 (!) 183/87  Pulse: 70 72  Resp: 16   SpO2: 96% 96%     Body mass index is 24.62 kg/m. Filed Weights   02/06/22 1104  Weight: 139 lb (63 kg)     Objective:   Physical Exam Vitals and nursing note reviewed.  Constitutional:      General: He is not in acute distress. Neck:     Vascular: No JVD.  Cardiovascular:     Rate and Rhythm: Normal rate and regular rhythm.     Pulses:          Radial pulses are 2+ on the right side and 2+ on the left side.       Dorsalis pedis pulses are 0 on the right side and 0 on the left side.       Posterior tibial pulses are 2+ on the right side and 2+ on the left side.     Heart sounds: Normal heart sounds. No murmur heard. Pulmonary:     Effort: Pulmonary effort is normal.     Breath sounds: Normal breath sounds. No wheezing or rales.  Musculoskeletal:     Right lower leg: No edema.     Left lower leg: No edema.         ssessment & Recommendations:   72 year old [name] speaking patient with uncontrolled type 2 diabetes mellitus, mixed hyperlipidemia, tobacco dependence, coronary artery disease with prior MI, LCX and RCA stents in 2012, now with CABG X 3.  LIMA LAD, RSVG PDA, OM1  (08/23/2020), HFrEF    CAD:  S/p CABG X 3.  LIMA LAD, RSVG PDA, OM1  (08/23/2020) Patient states he is compliant however he does not like going to pick up his meds every month Completed DAPT with Aspirin and plavix till 08/2021. Resume rosuvastatin - cannot tolerate higher  dose    HFrEF: EF 30-35%. Ischemic cardiomyopathy. Euvolumic.  Currently, he does not appear to be in clinical heart failure. Instructed patient to take two Lasix pills on days he feels very swollen    Hypertension Pressure are very labile Most recently he went to  ED with very low BP Patient will need to have meds reconciled. He should be on anti-hypertensives that are either combo pills and/or can be taken once daily He does not check his pressure regularly but home health care does once weekly Either cardiology or PCP needs to manage pts BP meds - he is confused regarding his med regiment and therefore BP extremely labile. There is no person in the family who can manage his medications for him reliably and regularly. Patient will come back next week with his son and bring in every medication bottle and we will consolidate his meds with very close follow-up.   Follow-up in 1 week or sooner if needed    Sammuel Hines [facility] Pager: [phone] Office: [phone]

## 2022-02-07 ENCOUNTER — Other Ambulatory Visit: Payer: Self-pay

## 2022-02-07 DIAGNOSIS — I2510 Atherosclerotic heart disease of native coronary artery without angina pectoris: Secondary | ICD-10-CM

## 2022-02-07 MED ORDER — PANTOPRAZOLE SODIUM 20 MG PO TBEC
20.0000 mg | DELAYED_RELEASE_TABLET | Freq: Every day | ORAL | 3 refills | Status: DC
  Filled 2022-02-07: qty 90, 90d supply, fill #0
  Filled 2022-06-20: qty 90, 90d supply, fill #1

## 2022-02-07 MED ORDER — CLOPIDOGREL BISULFATE 75 MG PO TABS
75.0000 mg | ORAL_TABLET | Freq: Every day | ORAL | 3 refills | Status: DC
  Filled 2022-02-07: qty 90, 90d supply, fill #0
  Filled 2022-06-20: qty 90, 90d supply, fill #1

## 2022-02-08 ENCOUNTER — Other Ambulatory Visit: Payer: Self-pay

## 2022-02-12 ENCOUNTER — Other Ambulatory Visit: Payer: Self-pay

## 2022-02-14 ENCOUNTER — Ambulatory Visit: Payer: 59 | Admitting: Cardiology

## 2022-02-14 ENCOUNTER — Other Ambulatory Visit: Payer: Self-pay

## 2022-02-14 ENCOUNTER — Encounter: Payer: Self-pay | Admitting: Cardiology

## 2022-02-14 VITALS — BP 178/74 | HR 67 | Resp 16 | Ht 63.0 in | Wt 143.0 lb

## 2022-02-14 DIAGNOSIS — I5020 Unspecified systolic (congestive) heart failure: Secondary | ICD-10-CM

## 2022-02-14 DIAGNOSIS — I2510 Atherosclerotic heart disease of native coronary artery without angina pectoris: Secondary | ICD-10-CM

## 2022-02-14 MED ORDER — NITROGLYCERIN 0.4 MG SL SUBL
SUBLINGUAL_TABLET | SUBLINGUAL | 3 refills | Status: AC
  Filled 2022-02-14: qty 25, 25d supply, fill #0

## 2022-02-14 MED ORDER — ISOSORBIDE MONONITRATE ER 30 MG PO TB24
30.0000 mg | ORAL_TABLET | Freq: Every day | ORAL | 3 refills | Status: DC
  Filled 2022-02-14: qty 90, 90d supply, fill #0
  Filled 2022-06-20: qty 90, 90d supply, fill #1

## 2022-02-14 MED ORDER — METOPROLOL SUCCINATE ER 50 MG PO TB24
50.0000 mg | ORAL_TABLET | Freq: Every day | ORAL | 3 refills | Status: DC
  Filled 2022-02-14: qty 90, 90d supply, fill #0
  Filled 2022-06-20: qty 90, 90d supply, fill #1

## 2022-02-14 MED ORDER — AMLODIPINE BESYLATE 5 MG PO TABS
5.0000 mg | ORAL_TABLET | Freq: Every day | ORAL | 3 refills | Status: DC
  Filled 2022-02-14: qty 90, 90d supply, fill #0
  Filled 2022-06-20: qty 90, 90d supply, fill #1

## 2022-02-14 MED ORDER — TORSEMIDE 40 MG PO TABS
20.0000 mg | ORAL_TABLET | Freq: Two times a day (BID) | ORAL | 3 refills | Status: DC
  Filled 2022-02-14: qty 60, fill #0

## 2022-02-14 MED ORDER — FUROSEMIDE 20 MG PO TABS
ORAL_TABLET | ORAL | 2 refills | Status: DC
  Filled 2022-02-14: qty 180, 30d supply, fill #0
  Filled 2022-06-20: qty 180, 30d supply, fill #1

## 2022-02-14 NOTE — Progress Notes (Signed)
Follow up visit  Subjective:   Colin [name], male    DOB: 1950/06/22, 72 y.o.   MRN: [id number]   HPI  Chief Complaint  Patient presents with   Coronary Artery Disease   Follow-up    23 week   72 year old [name] speaking patient with uncontrolled type 2 diabetes mellitus, mixed hyperlipidemia, tobacco dependence, coronary artery disease with prior MI, LCX and RCA stents in 2012, s/p CABG X 3.  LIMA LAD, RSVG PDA, OM1  (08/23/2020), HFrEF  Patient is here with his son today, who assisted with language interpretation.  Patient continues to have exertional dyspnea with minimal activity, such as walking from the parking lot to the office.  Legs remain swollen.  He also reports occasional episodes of chest pain that improved with rest.  There has been significant confusion regarding medications, and concern regarding compliance.  Blood pressure is elevated today. Reviewed recent test results with the patient, details below.     Current Outpatient Medications:    clopidogrel (PLAVIX) 75 MG tablet, Take 1 tablet (75 mg total) by mouth daily., Disp: 90 tablet, Rfl: 3   Continuous Blood Gluc Receiver (FREESTYLE LIBRE 3 READER) DEVI, Use as directed!, Disp: 1 each, Rfl: 0   Continuous Blood Gluc Sensor (FREESTYLE LIBRE 3 SENSOR) MISC, Use as instructed change every 14 days, Disp: 6 each, Rfl: 3   empagliflozin (JARDIANCE) 25 MG TABS tablet, Take 25 mg by mouth daily., Disp: , Rfl:    furosemide (LASIX) 20 MG tablet, Take 3 tablets (60 mg total) by mouth daily., Disp: 90 tablet, Rfl: 2   insulin isophane & regular human KwikPen (NOVOLIN 70/30 KWIKPEN) (70-30) 100 UNIT/ML KwikPen, Inject 30 Units into the skin 2 (two) times daily before a meal. (Patient taking differently: Inject 40 Units into the skin daily.), Disp: 30 mL, Rfl: 3   isosorbide-hydrALAZINE (BIDIL) 20-37.5 MG tablet, Take 1 tablet by mouth 3 (three) times daily., Disp: 90 tablet, Rfl: 0   ivabradine (CORLANOR) 5 MG TABS tablet,  Take 1 tablet (5 mg total) by mouth 2 (two) times daily with a meal., Disp: 60 tablet, Rfl: 0   nitroGLYCERIN (NITROSTAT) 0.4 MG SL tablet, Place 1 tab (0.4mg  total) under the tongue every 5 minutes as needed for chest pain, Disp: 30 tablet, Rfl: 3   pantoprazole (PROTONIX) 20 MG tablet, Take 1 tablet (20 mg total) by mouth daily., Disp: 90 tablet, Rfl: 3   rosuvastatin (CRESTOR) 5 MG tablet, Take 1 tablet (5 mg total) by mouth daily., Disp: 30 tablet, Rfl: 3   Cardiovascular & other pertient studies:  Reviewed external labs and tests, independently interpreted  EKG 02/06/2022: Sinus at 70 bpm. Old anteroseptal infarct LVH  Recent labs: 01/23/2022: Glucose 118, BUN/Cr 28/2.14. EGFR 32. Na/K 141/3.8.  H/H 10/31. MCV 94. Platelets 184 TSH 5.0 normal  11/2021: Chol 305, TG 84, HDL 62, LDL 225 Lipoprotein (a) 373 HbA1C 11.4%   Review of Systems  Cardiovascular:  Positive for chest pain, dyspnea on exertion and leg swelling. Negative for palpitations and syncope.         Vitals:   02/14/22 1443 02/14/22 1447  BP: (!) 191/80 (!) 178/74  Pulse: 68 67  Resp: 16   SpO2: 95%     Body mass index is 25.33 kg/m. Filed Weights   02/14/22 1443  Weight: 143 lb (64.9 kg)     Objective:   Physical Exam Vitals and nursing note reviewed.  Constitutional:      General:  He is not in acute distress. Neck:     Vascular: No JVD.  Cardiovascular:     Rate and Rhythm: Normal rate and regular rhythm.     Heart sounds: Normal heart sounds. No murmur heard. Pulmonary:     Effort: Pulmonary effort is normal.     Breath sounds: Normal breath sounds. No wheezing or rales.  Musculoskeletal:     Right lower leg: Edema (2+) present.     Left lower leg: Edema (2+) present.             Visit diagnoses:   ICD-10-CM   1. Coronary artery disease involving native coronary artery of native heart without angina pectoris  I25.10 metoprolol succinate (TOPROL-XL) 50 MG 24 hr tablet     isosorbide mononitrate (IMDUR) 30 MG 24 hr tablet    amLODipine (NORVASC) 5 MG tablet    nitroGLYCERIN (NITROSTAT) 0.4 MG SL tablet    2. HFrEF (heart failure with reduced ejection fraction) (HCC)  G38.75 Basic metabolic panel    B Nat Peptide    DISCONTINUED: torsemide 40 MG TABS       Orders Placed This Encounter  Procedures   Basic metabolic panel   B Nat Peptide     Medication changes this visit: Medications Discontinued During This Encounter  Medication Reason   isosorbide-hydrALAZINE (BIDIL) 20-37.5 MG tablet Discontinued by provider   nitroGLYCERIN (NITROSTAT) 0.4 MG SL tablet Reorder   furosemide (LASIX) 20 MG tablet Change in therapy    Meds ordered this encounter  Medications   metoprolol succinate (TOPROL-XL) 50 MG 24 hr tablet    Sig: Take 1 tablet (50 mg total) by mouth daily. Take with or immediately following a meal.    Dispense:  90 tablet    Refill:  3   isosorbide mononitrate (IMDUR) 30 MG 24 hr tablet    Sig: Take 1 tablet (30 mg total) by mouth daily.    Dispense:  90 tablet    Refill:  3   amLODipine (NORVASC) 5 MG tablet    Sig: Take 1 tablet (5 mg total) by mouth daily.    Dispense:  90 tablet    Refill:  3   nitroGLYCERIN (NITROSTAT) 0.4 MG SL tablet    Sig: Place 1 tab (0.4mg  total) under the tongue every 5 minutes as needed for chest pain    Dispense:  25 tablet    Refill:  3   DISCONTD: torsemide 40 MG TABS    Sig: Take 20 mg by mouth 2 (two) times daily.    Dispense:  60 tablet    Refill:  3     Assessment & Recommendations:   72 y.o. [name] speaking patient with uncontrolled type 2 diabetes mellitus, mixed hyperlipidemia, tobacco dependence, coronary artery disease with prior MI, LCX and RCA stents in 2012, s/p CABG X 3.  LIMA LAD, RSVG PDA, OM1  (08/23/2020), HFrEF  Acute on chronic HFrEF: Increase lasix to 60 mg bid. (Changed from torsemide back to lasix due to cost).  Stop Bidil (which he is not taking regularly anyway). Given  his CAD and possibly angina along with HFrEF, started metoprolol succinate 25 mg daily, Imdur 30 mg daily. Also added amlodipine 5 mg daily given uncontrolled hypertension. From prior exeprience, he does tend to respond well to diuretics. Going forward, will consider adding ARNI/MRA/SGLT2i if renal function improved furtheer. Check BMP, proBNP in 1 week.  F/u in 2 weeks        Ayson Cherubini J  Virgina Jock, MD Pager: [phone] Office: [phone]

## 2022-02-15 ENCOUNTER — Other Ambulatory Visit: Payer: Self-pay

## 2022-02-16 ENCOUNTER — Other Ambulatory Visit: Payer: Self-pay

## 2022-02-16 ENCOUNTER — Encounter: Payer: Self-pay | Admitting: Cardiology

## 2022-02-20 ENCOUNTER — Other Ambulatory Visit: Payer: Self-pay

## 2022-02-23 ENCOUNTER — Other Ambulatory Visit: Payer: Self-pay

## 2022-02-27 ENCOUNTER — Other Ambulatory Visit: Payer: Self-pay

## 2022-02-28 LAB — BASIC METABOLIC PANEL
BUN/Creatinine Ratio: 13 (ref 10–24)
BUN: 32 mg/dL — ABNORMAL HIGH (ref 8–27)
CO2: 27 mmol/L (ref 20–29)
Calcium: 8.4 mg/dL — ABNORMAL LOW (ref 8.6–10.2)
Chloride: 100 mmol/L (ref 96–106)
Creatinine, Ser: 2.49 mg/dL — ABNORMAL HIGH (ref 0.76–1.27)
Glucose: 399 mg/dL — ABNORMAL HIGH (ref 70–99)
Potassium: 5.2 mmol/L (ref 3.5–5.2)
Sodium: 140 mmol/L (ref 134–144)
eGFR: 27 mL/min/{1.73_m2} — ABNORMAL LOW (ref >59)

## 2022-02-28 LAB — BRAIN NATRIURETIC PEPTIDE: BNP: 286.3 pg/mL — ABNORMAL HIGH (ref 0.0–100.0)

## 2022-03-01 ENCOUNTER — Telehealth: Payer: Self-pay

## 2022-03-01 ENCOUNTER — Other Ambulatory Visit (HOSPITAL_COMMUNITY): Payer: Self-pay

## 2022-03-01 NOTE — Telephone Encounter (Signed)
Patient home BP still elevated but much improved with new medications. Weight is fluctuating and may not be accurate. Patient is reported to be having some cramping and edema but no more edema than patient normally has.  Labs have resulted.   Systolic Blood Pressure 237.6 (124.0 - 283.1)  Diastolic Blood Pressure 51.7 (66.0 - 99.0)  Heart Rate 66.1 (59.0 - 75.0)  03/01/22 9:01 AM [facility] TMB-... 172 / 77 mmHg 63 bpm  02/28/22 8:41 AM [facility] TMB-... 145 / 81 mmHg 62 bpm  02/28/22 8:39 AM [facility] TMB-... 144 / 83 mmHg 60 bpm  02/27/22 8:35 AM [facility] TMB-... 149 / 73 mmHg 59 bpm  02/26/22 1:18 PM [facility] TMB-... 167 / 93 mmHg 64 bpm  02/26/22 12:51 PM [facility] TMB-... 191 / 92 mmHg 63 bpm  02/26/22 12:40 PM [facility] TMB-... 202 / 99 mmHg 67 bpm  02/26/22 12:38 PM [facility] TMB-... 199 / 96 mmHg 68 bpm  02/26/22 8:42 AM [facility] TMB-... 147 / 82 mmHg 63 Bpm   Weight lb 124.3 (114.6 - 136.7)  03/01/22 8:59 AM [facility] GBS-21... 114.6 lb    02/28/22 1:49 PM [facility] GBS-21... 116.8 lb    02/28/22 8:36 AM [facility] GBS-21... 134.5 lb    02/27/22 8:32 AM [facility] GBS-21... 132.3 lb       02/26/22 8:37 AM [facility] GBS-21... 114.6 lb       02/23/22 8:59 AM [facility] GBS-21... 136.7 lb    02/22/22 8:54 AM [facility] GBS-21... 130.1 lb    02/21/22 8:35 AM [facility] GBS-21... 123.5 lb    02/20/22 8:37 AM [facility] GBS-21... 127.9 lb

## 2022-03-02 ENCOUNTER — Emergency Department (HOSPITAL_COMMUNITY)
Admission: EM | Admit: 2022-03-02 | Discharge: 2022-03-02 | Discharge status: Against Medical Advice | Payer: 59 | Attending: Emergency Medicine | Admitting: Emergency Medicine

## 2022-03-02 ENCOUNTER — Emergency Department (HOSPITAL_COMMUNITY): Payer: 59

## 2022-03-02 ENCOUNTER — Ambulatory Visit: Payer: 59 | Admitting: Cardiology

## 2022-03-02 ENCOUNTER — Encounter: Payer: Self-pay | Admitting: Cardiology

## 2022-03-02 VITALS — BP 150/65 | HR 64 | Resp 16 | Ht 63.0 in | Wt 139.0 lb

## 2022-03-02 DIAGNOSIS — I5020 Unspecified systolic (congestive) heart failure: Secondary | ICD-10-CM

## 2022-03-02 DIAGNOSIS — Z5321 Procedure and treatment not carried out due to patient leaving prior to being seen by health care provider: Secondary | ICD-10-CM | POA: Insufficient documentation

## 2022-03-02 DIAGNOSIS — S4991XA Unspecified injury of right shoulder and upper arm, initial encounter: Secondary | ICD-10-CM

## 2022-03-02 DIAGNOSIS — R519 Headache, unspecified: Secondary | ICD-10-CM | POA: Diagnosis not present

## 2022-03-02 DIAGNOSIS — M25511 Pain in right shoulder: Secondary | ICD-10-CM | POA: Insufficient documentation

## 2022-03-02 DIAGNOSIS — I2510 Atherosclerotic heart disease of native coronary artery without angina pectoris: Secondary | ICD-10-CM

## 2022-03-02 DIAGNOSIS — Z8673 Personal history of transient ischemic attack (TIA), and cerebral infarction without residual deficits: Secondary | ICD-10-CM | POA: Diagnosis not present

## 2022-03-02 LAB — CBC WITH DIFFERENTIAL/PLATELET
Abs Immature Granulocytes: 0.02 10*3/uL (ref 0.00–0.07)
Basophils Absolute: 0.1 10*3/uL (ref 0.0–0.1)
Basophils Relative: 1 %
Eosinophils Absolute: 0.3 10*3/uL (ref 0.0–0.5)
Eosinophils Relative: 4 %
HCT: 36.4 % — ABNORMAL LOW (ref 39.0–52.0)
Hemoglobin: 12 g/dL — ABNORMAL LOW (ref 13.0–17.0)
Immature Granulocytes: 0 %
Lymphocytes Relative: 23 %
Lymphs Abs: 1.5 10*3/uL (ref 0.7–4.0)
MCH: 31.1 pg (ref 26.0–34.0)
MCHC: 33 g/dL (ref 30.0–36.0)
MCV: 94.3 fL (ref 80.0–100.0)
Monocytes Absolute: 0.4 10*3/uL (ref 0.1–1.0)
Monocytes Relative: 6 %
Neutro Abs: 4.4 10*3/uL (ref 1.7–7.7)
Neutrophils Relative %: 66 %
Platelets: 251 10*3/uL (ref 150–400)
RBC: 3.86 MIL/uL — ABNORMAL LOW (ref 4.22–5.81)
RDW: 13.3 % (ref 11.5–15.5)
WBC: 6.7 10*3/uL (ref 4.0–10.5)
nRBC: 0 % (ref 0.0–0.2)

## 2022-03-02 LAB — COMPREHENSIVE METABOLIC PANEL
ALT: 13 U/L (ref 0–44)
AST: 22 U/L (ref 15–41)
Albumin: 2.1 g/dL — ABNORMAL LOW (ref 3.5–5.0)
Alkaline Phosphatase: 60 U/L (ref 38–126)
Anion gap: 7 (ref 5–15)
BUN: 28 mg/dL — ABNORMAL HIGH (ref 8–23)
CO2: 30 mmol/L (ref 22–32)
Calcium: 8.2 mg/dL — ABNORMAL LOW (ref 8.9–10.3)
Chloride: 104 mmol/L (ref 98–111)
Creatinine, Ser: 2.28 mg/dL — ABNORMAL HIGH (ref 0.61–1.24)
GFR, Estimated: 30 mL/min — ABNORMAL LOW (ref >60)
Glucose, Bld: 133 mg/dL — ABNORMAL HIGH (ref 70–99)
Potassium: 3.7 mmol/L (ref 3.5–5.1)
Sodium: 141 mmol/L (ref 135–145)
Total Bilirubin: 0.3 mg/dL (ref 0.3–1.2)
Total Protein: 5.7 g/dL — ABNORMAL LOW (ref 6.5–8.1)

## 2022-03-02 NOTE — ED Provider Triage Note (Signed)
Emergency Medicine Provider Triage Evaluation Note  Colin [name] , a 72 y.o. male  was evaluated in triage.  Patient very difficult to obtain history from given specific dialect of [place] that he speaks.  Unable to find interpreter speaking specific dialect.  Patient with history of right shoulder weakness and numbness since January 17.  Also reports pain initially and then states it was without pain.  Unaware if this is because of lack of ability to interpret exactly what is going on.  Patient denies visual disturbance, gait abnormalities, other weakness/sensory deficits, slurred speech, drooping of the face.  Review of Systems  Positive: See above Negative:   Physical Exam  BP (!) 181/72   Pulse 63   Temp 98.2 F (36.8 C) (Oral)   Resp 18   SpO2 100%  Gen:   Awake, no distress   Resp:  Normal effort  MSK:   Moves extremities without difficulty  Other:  No tender to palpation of the right shoulder.  No pain with range of motion of right shoulder.  Patient with symmetric grip strength but unable to raise right shoulder above head unassisted by left arm.  Cranial nerves III through XII grossly intact.  Medical Decision Making  Medically screening exam initiated at 4:18 PM.  Appropriate orders placed.  Colin [name] was informed that the remainder of the evaluation will be completed by another provider, this initial triage assessment does not replace that evaluation, and the importance of remaining in the ED until their evaluation is complete.    Wilnette Kales, [place] 03/02/22 1620

## 2022-03-02 NOTE — Progress Notes (Signed)
Follow up visit  Subjective:   Colin [name], male    DOB: 03/26/1950, 72 y.o.   MRN: [id number]   HPI  Chief Complaint  Patient presents with   Coronary Artery Disease   Follow-up    39 week   72 year old [name] speaking patient with uncontrolled type 2 diabetes mellitus, mixed hyperlipidemia, tobacco dependence, coronary artery disease with prior MI, LCX and RCA stents in 2012, s/p CABG X 3.  LIMA LAD, RSVG PDA, OM1  (08/23/2020), HFrEF  Patient is here with his son today, who assisted with language interpretation.  Weight readings are fluctuating anywhere between 115-1 and 35 pounds, raising question of accuracy.  He has had no worsening of his exertional dyspnea or leg edema symptoms.  He has not had any recurrent chest pain symptoms.  Unfortunately, a week ago, he fell while walking outside and hurt his right shoulder.  He has had severe restriction of his right arm movement since then.    Current Outpatient Medications:    amLODipine (NORVASC) 5 MG tablet, Take 1 tablet (5 mg total) by mouth daily., Disp: 90 tablet, Rfl: 3   clopidogrel (PLAVIX) 75 MG tablet, Take 1 tablet (75 mg total) by mouth daily., Disp: 90 tablet, Rfl: 3   Continuous Blood Gluc Receiver (FREESTYLE LIBRE 3 READER) DEVI, Use as directed!, Disp: 1 each, Rfl: 0   Continuous Blood Gluc Sensor (FREESTYLE LIBRE 3 SENSOR) MISC, Use as instructed change every 14 days, Disp: 6 each, Rfl: 3   empagliflozin (JARDIANCE) 25 MG TABS tablet, Take 25 mg by mouth daily., Disp: , Rfl:    furosemide (LASIX) 20 MG tablet, Take 60 mg by mouth 2 (two) times daily., Disp: , Rfl:    furosemide (LASIX) 20 MG tablet, TAKE 3 TABLETS BY MOUTH TWICE A DAY, Disp: 180 tablet, Rfl: 2   insulin isophane & regular human KwikPen (NOVOLIN 70/30 KWIKPEN) (70-30) 100 UNIT/ML KwikPen, Inject 30 Units into the skin 2 (two) times daily before a meal. (Patient taking differently: Inject 40 Units into the skin daily.), Disp: 30 mL, Rfl: 3    isosorbide mononitrate (IMDUR) 30 MG 24 hr tablet, Take 1 tablet (30 mg total) by mouth daily., Disp: 90 tablet, Rfl: 3   ivabradine (CORLANOR) 5 MG TABS tablet, Take 1 tablet (5 mg total) by mouth 2 (two) times daily with a meal., Disp: 60 tablet, Rfl: 0   metoprolol succinate (TOPROL-XL) 50 MG 24 hr tablet, Take 1 tablet (50 mg total) by mouth daily. Take with or immediately following a meal., Disp: 90 tablet, Rfl: 3   nitroGLYCERIN (NITROSTAT) 0.4 MG SL tablet, Place 1 tab (0.4mg  total) under the tongue every 5 minutes as needed for chest pain, Disp: 25 tablet, Rfl: 3   pantoprazole (PROTONIX) 20 MG tablet, Take 1 tablet (20 mg total) by mouth daily., Disp: 90 tablet, Rfl: 3   rosuvastatin (CRESTOR) 5 MG tablet, Take 1 tablet (5 mg total) by mouth daily., Disp: 30 tablet, Rfl: 3   Cardiovascular & other pertient studies:  Reviewed external labs and tests, independently interpreted  EKG 02/06/2022: Sinus at 70 bpm. Old anteroseptal infarct LVH  Recent labs: 02/27/2022: Glucose 399, BUN/Cr 32/2.49. EGFR 27. Na/K 140/5.2.  BNP 286  01/23/2022: Glucose 118, BUN/Cr 28/2.14. EGFR 32. Na/K 141/3.8.  H/H 10/31. MCV 94. Platelets 184 TSH 5.0 normal  11/2021: Chol 305, TG 84, HDL 62, LDL 225 Lipoprotein (a) 373 HbA1C 11.4%   Review of Systems  Cardiovascular:  Positive for  dyspnea on exertion and leg swelling. Negative for chest pain, palpitations and syncope.  Musculoskeletal:  Positive for joint pain.         Vitals:   03/02/22 1419  BP: (!) 150/65  Pulse: 64  Resp: 16  SpO2: 94%    Body mass index is 24.62 kg/m. Filed Weights   03/02/22 1419  Weight: 139 lb (63 kg)     Objective:   Physical Exam Vitals and nursing note reviewed.  Constitutional:      General: He is not in acute distress. Neck:     Vascular: No JVD.  Cardiovascular:     Rate and Rhythm: Normal rate and regular rhythm.     Heart sounds: Normal heart sounds. No murmur heard. Pulmonary:      Effort: Pulmonary effort is normal.     Breath sounds: Normal breath sounds. No wheezing or rales.  Musculoskeletal:     Right lower leg: Edema (2+) present.     Left lower leg: Edema (2+) present.     Comments: Severe restriction of right shoulder movement, abduction possible only to about 30 degrees.             Visit diagnoses:   ICD-10-CM   1. Coronary artery disease involving native coronary artery of native heart without angina pectoris  I25.10     2. HFrEF (heart failure with reduced ejection fraction) (HCC)  I50.20     3. Injury of right shoulder, initial encounter  S49.91XA          Assessment & Recommendations:   72 y.o. [name] speaking patient with uncontrolled type 2 diabetes mellitus, mixed hyperlipidemia, tobacco dependence, coronary artery disease with prior MI, LCX and RCA stents in 2012, s/p CABG X 3.  LIMA LAD, RSVG PDA, OM1  (08/23/2020), HFrEF  Right shoulder injury: Fall right shoulder 1 week ago.  He did not seek medical attention at that time, and has not had any imaging.  I am concerned about possible right shoulder dislocation.  With his decompensated heart failure and known CAD, I am concerned that outpatient treatment for the same will likely take a long time.  I think the best option for him would be to go to emergency room, at least have some imaging, and consider orthopedic consultation.  We can follow along to manage his heart failure and optimize him with IV diuresis, hopefully to reduce his perioperative cardiac risk, should he need any surgery.  HFrEF: Remains volume overloaded. Currently on Lasix 60 mg twice daily, metoprolol succinate 25 mg daily. Unable to add RNI/MRA/SGLT2i due to renal dysfunction.    CAD: No recurrent angina symptoms at this time.   Continue aspirin, statin. Continue metoprolol succinate 25 mg daily, Imdur 30 mg daily, amlodipine 5 mg daily.  F/u in 4 weeks        Nigel Mormon, MD Pager:  [phone] Office: [phone]

## 2022-03-02 NOTE — ED Notes (Signed)
Pt called 2x with no response  

## 2022-03-02 NOTE — ED Triage Notes (Signed)
Patient here for evaluation of sudden onset of right shoulder pain and dizziness on January 17 this year. Patient states he is now unable to lift his right arm without feeling pain in his shoulder. Patient denies vision changes, is alert, oriented, and in no apparent distress at this time.

## 2022-03-02 NOTE — ED Notes (Signed)
Called patient name three times for room no answer

## 2022-03-05 ENCOUNTER — Other Ambulatory Visit: Payer: Medicare Other

## 2022-03-06 ENCOUNTER — Other Ambulatory Visit: Payer: Self-pay

## 2022-03-09 ENCOUNTER — Encounter: Payer: Medicare Other | Admitting: Endocrinology

## 2022-03-09 NOTE — Progress Notes (Signed)
This encounter was created in error - please disregard.

## 2022-03-12 ENCOUNTER — Other Ambulatory Visit: Payer: Self-pay

## 2022-03-14 ENCOUNTER — Other Ambulatory Visit: Payer: Self-pay

## 2022-03-14 ENCOUNTER — Encounter: Payer: Self-pay | Admitting: Endocrinology

## 2022-03-14 ENCOUNTER — Ambulatory Visit (INDEPENDENT_AMBULATORY_CARE_PROVIDER_SITE_OTHER): Payer: 59 | Admitting: Endocrinology

## 2022-03-14 VITALS — BP 136/78 | HR 79 | Ht 63.0 in | Wt 143.6 lb

## 2022-03-14 DIAGNOSIS — Z794 Long term (current) use of insulin: Secondary | ICD-10-CM | POA: Diagnosis not present

## 2022-03-14 DIAGNOSIS — E1165 Type 2 diabetes mellitus with hyperglycemia: Secondary | ICD-10-CM

## 2022-03-14 DIAGNOSIS — E782 Mixed hyperlipidemia: Secondary | ICD-10-CM | POA: Diagnosis not present

## 2022-03-14 LAB — POCT GLYCOSYLATED HEMOGLOBIN (HGB A1C): Hemoglobin A1C: 9.8 % — AB (ref 4.0–5.6)

## 2022-03-14 LAB — POCT GLUCOSE (DEVICE FOR HOME USE): POC Glucose: 85 mg/dl (ref 70–99)

## 2022-03-14 MED ORDER — NOVOLIN 70/30 FLEXPEN (70-30) 100 UNIT/ML ~~LOC~~ SUPN
30.0000 [IU] | PEN_INJECTOR | Freq: Two times a day (BID) | SUBCUTANEOUS | 3 refills | Status: DC
  Filled 2022-03-14: qty 30, 50d supply, fill #0

## 2022-03-14 MED ORDER — ROSUVASTATIN CALCIUM 5 MG PO TABS
5.0000 mg | ORAL_TABLET | Freq: Every day | ORAL | 3 refills | Status: DC
  Filled 2022-03-14: qty 90, 90d supply, fill #0
  Filled 2022-06-20: qty 90, 90d supply, fill #1

## 2022-03-14 NOTE — Progress Notes (Signed)
Patient ID: Colin [name], male   DOB: [birth year], 72 y.o.   MRN: [id number]           Reason for Appointment: Type II Diabetes follow-up   History of Present Illness   History was taken through the interpreter  Diagnosis date: 2006  Previous history:  He was started on insulin in 2021 and by history has been taking NPH, Lantus and NovoLog at various times However recent records indicate he has had persistently high A1c with the lowest reading only 8.4 in 9/22  Recent history:     Non-insulin hypoglycemic drugs: Jardiance 25 mg daily     Insulin regimen:   Humulin 70/30, 30 units bid    Side effects from medications: None  Current self management, blood sugar patterns and problems identified:  A1c is 9.8, improved results 03/14/2022 His blood sugars may be a little better but difficult to assess his readings since he did not bring his meter and apparently checking mostly in the morning Also has difficulty remembering what his blood sugars are in the morning He says that he has blood sugars as low as 80 in the morning and occasionally around the low 100 range  His lab glucose was 399 last month in the lab in the afternoon but today it is 85 without breakfast He takes his insulin in the morning at 8:30 AM but does not usually eat a meal until 11 AM  He says that his takes his evening insulin at bedtime despite instructions on taking it before dinner He was sent a prescription for the libre sensor but apparently this is not covered by his insurance He does not report any hypoglycemic symptoms day or night He has not had any weight change He appears not to have continued his Jardiance as prescribed previously   Diet management: He is usually avoiding regular soft drinks      Monitors blood glucose: < Once a day.    Glucometer: One Touch.           Blood Glucose readings as above   Hypoglycemia:  none                        Dietician visit: Most recent:   None  Weight  control:  Wt Readings from Last 3 Encounters:  03/14/22 143 lb 9.6 oz (65.1 kg)  03/02/22 139 lb (63 kg)  02/14/22 143 lb (64.9 kg)            Diabetes labs:  Lab Results  Component Value Date   HGBA1C 9.8 (A) 03/14/2022   HGBA1C 11.4 (H) 11/08/2021   HGBA1C 13.6 (H) 06/26/2021   Lab Results  Component Value Date   MICROALBUR 373.3 (H) 06/29/2021   LDLCALC 225 (H) 11/08/2021   CREATININE 2.28 (H) 03/02/2022     Allergies as of 03/14/2022   No Known Allergies      Medication List        Accurate as of March 14, 2022 10:05 AM. If you have any questions, ask your nurse or doctor.          amLODipine 5 MG tablet Commonly known as: NORVASC Take 1 tablet (5 mg total) by mouth daily.   clopidogrel 75 MG tablet Commonly known as: Plavix Take 1 tablet (75 mg total) by mouth daily.   FreeStyle [place] 3 Reader [place]blin Use as directed!   FreeStyle Libre 3 Sensor Misc Use as instructed change every 14 days  furosemide 20 MG tablet Commonly known as: LASIX Take 60 mg by mouth 2 (two) times daily.   furosemide 20 MG tablet Commonly known as: LASIX TAKE 3 TABLETS BY MOUTH TWICE A DAY   isosorbide mononitrate 30 MG 24 hr tablet Commonly known as: IMDUR Take 1 tablet (30 mg total) by mouth daily.   ivabradine 5 MG Tabs tablet Commonly known as: CORLANOR Take 1 tablet (5 mg total) by mouth 2 (two) times daily with a meal.   Jardiance 25 MG Tabs tablet Generic drug: empagliflozin Take 25 mg by mouth daily.   metoprolol succinate 50 MG 24 hr tablet Commonly known as: TOPROL-XL Take 1 tablet (50 mg total) by mouth daily. Take with or immediately following a meal.   nitroGLYCERIN 0.4 MG SL tablet Commonly known as: NITROSTAT Place 1 tab (0.4mg  total) under the tongue every 5 minutes as needed for chest pain   NovoLIN 70/30 Kwikpen (70-30) 100 UNIT/ML KwikPen Generic drug: insulin isophane & regular human KwikPen Inject 30 Units into the skin 2 (two) times  daily before a meal. What changed:  how much to take when to take this   pantoprazole 20 MG tablet Commonly known as: PROTONIX Take 1 tablet (20 mg total) by mouth daily.   rosuvastatin 5 MG tablet Commonly known as: CRESTOR Take 1 tablet (5 mg total) by mouth daily.        Allergies: No Known Allergies  Past Medical History:  Diagnosis Date   Arrhythmia    CAD (coronary artery disease)    stent x 2   Carotid artery occlusion    Diabetes mellitus    Dyslipidemia    Essential hypertension     Past Surgical History:  Procedure Laterality Date   CARDIAC CATHETERIZATION     01/2010 ; 04/2010   CORONARY ARTERY BYPASS GRAFT N/A 08/23/2020   Procedure: CORONARY ARTERY BYPASS GRAFTING (CABG) x 3 ON CARDIOPULMONARY BYPASS USING EVH AND PARTIALLY OPEN HARVESTED LEFT GREATER SAPHENOUS VEIN AND LEFT INTERNAL MAMMARY ARTERY.  LIMA TO LAD, SVG TO OM, SVG TO PDA;  Surgeon: Lajuana Matte, MD;  Location: [place];  Service: Open Heart Surgery;  Laterality: N/A;   RIGHT HEART CATH N/A 12/19/2021   Procedure: RIGHT HEART CATH;  Surgeon: Nigel Mormon, MD;  Location: [place] [facility];  Service: Cardiovascular;  Laterality: N/A;   RIGHT/LEFT HEART CATH AND CORONARY ANGIOGRAPHY N/A 08/22/2020   Procedure: RIGHT/LEFT HEART CATH AND CORONARY ANGIOGRAPHY;  Surgeon: Nigel Mormon, MD;  Location: [place] [facility];  Service: Cardiovascular;  Laterality: N/A;   TEE WITHOUT CARDIOVERSION N/A 08/23/2020   Procedure: TRANSESOPHAGEAL ECHOCARDIOGRAM (TEE);  Surgeon: Lajuana Matte, MD;  Location: [place];  Service: Open Heart Surgery;  Laterality: N/A;   TEMPORARY PACEMAKER N/A 08/22/2020   Procedure: TEMPORARY PACEMAKER;  Surgeon: Nigel Mormon, MD;  Location: [place] [facility];  Service: Cardiovascular;  Laterality: N/A;   VEIN HARVEST Left 08/23/2020   Procedure: ENDOVEIN HARVEST AND PARTIALLY OPEN GREATER SAPHENOUS VEIN HARVESTING;  Surgeon: Lajuana Matte, MD;   Location: [place];  Service: Open Heart Surgery;  Laterality: Left;    Family History  Problem Relation Age of Onset   Diabetes Neg Hx     Social History:  reports that he quit smoking about 19 months ago. His smoking use included cigarettes. He has a 12.50 pack-year smoking history. He has never used smokeless tobacco. He reports that he does not drink alcohol and does not  use drugs.  Review of Systems:  Last diabetic eye exam date: Unknown  Last foot exam date: 9/22  Occasionally may have some numbness in his feet    Hypertension: He has been managed with Entresto and metoprolol prescribed by his cardiologist   BP Readings from Last 3 Encounters:  03/14/22 136/78  03/02/22 (!) 181/72  03/02/22 (!) 150/65    Lipids: He was given Crestor by his cardiologist but he has not taken this, apparently has not had a refill    Lab Results  Component Value Date   CHOL 305 (H) 11/08/2021   CHOL 279 (H) 11/11/2020   CHOL 187 08/21/2020   Lab Results  Component Value Date   HDL 62.70 11/08/2021   HDL 61 11/11/2020   HDL 40 (L) 08/21/2020   Lab Results  Component Value Date   LDLCALC 225 (H) 11/08/2021   LDLCALC 207 (H) 11/11/2020   LDLCALC 136 (H) 08/21/2020   Lab Results  Component Value Date   TRIG 84.0 11/08/2021   TRIG 71 11/11/2020   TRIG 53 08/21/2020   Lab Results  Component Value Date   CHOLHDL 5 11/08/2021   CHOLHDL 4.6 11/11/2020   CHOLHDL 4.7 08/21/2020   Lab Results  Component Value Date   LDLDIRECT 137.1 (H) 08/21/2020     Examination:   BP 136/78 (BP Location: Left Arm, Patient Position: Sitting, Cuff Size: Normal)   Pulse 79   Ht 5\' 3"  (1.6 m)   Wt 143 lb 9.6 oz (65.1 kg)   SpO2 97%   BMI 25.44 kg/m   Body mass index is 25.44 kg/m.   No significant ankle edema  ASSESSMENT/ PLAN:    Diabetes type 2 with nephropathy:   Current regimen: 70/30 insulin twice a day  Blood glucose control is persistently poor  A1c is still high at 9.8  although improving  Unable to adjust his insulin because of lack of adequate glucose monitoring at home, currently has only sporadic fasting readings and does not keep a record or bring his meter for review As above he is taking insulin at inappropriate times He may have higher readings during the day compared to overnight   Recommendations:  Check blood sugars on waking up 3 days a week  Also check blood sugars about 2 hours after meals and do this after different meals by rotation  Recommended blood sugar levels on waking up are 90-130 and about 2 hours after meal is 130-160  He needs to bring your blood sugar monitor to each visit  He will need to check with the pharmacy if the freestyle libre is available  Insulin to be taken 1/2 hr before meals  Take 35 units before Bfst and 25 before dinner  Call if sugar staying over 200  Also since Novolin is not covered by his insurance he will be switched to Humulin 70/30  HYPERTENSION: Blood pressure is improved  History of hyperlipidemia: LDL was high from noncompliance with rosuvastatin, will start back on Crestor 40 mg daily and new prescription sent  Patient Instructions  Check blood sugars on waking up 3 days a week  Also check blood sugars about 2 hours after meals and do this after different meals by rotation  Recommended blood sugar levels on waking up are 90-130 and about 2 hours after meal is 130-160  Please bring your blood sugar monitor to each visit, thank you  Insulin to be taken 1/2 hr before meals  Take 35 units before Bfst  and 25 before dinner  Call if sugar staying over 200    Nastasia Kage 03/14/2022, 10:05 AM   Renal: Urine microalbumin 629, previously 903, will continue to follow

## 2022-03-14 NOTE — Patient Instructions (Addendum)
Check blood sugars on waking up 3 days a week  Also check blood sugars about 2 hours after meals and do this after different meals by rotation  Recommended blood sugar levels on waking up are 90-130 and about 2 hours after meal is 130-160  Please bring your blood sugar monitor to each visit, thank you  Insulin to be taken 1/2 hr before meals  Take 35 units before Bfst and 25 before dinner  Call if sugar staying over 200

## 2022-03-15 ENCOUNTER — Other Ambulatory Visit: Payer: Self-pay

## 2022-03-15 ENCOUNTER — Telehealth: Payer: Self-pay

## 2022-03-15 DIAGNOSIS — Z794 Long term (current) use of insulin: Secondary | ICD-10-CM

## 2022-03-15 MED ORDER — HUMULIN 70/30 KWIKPEN (70-30) 100 UNIT/ML ~~LOC~~ SUPN
PEN_INJECTOR | SUBCUTANEOUS | 3 refills | Status: DC
  Filled 2022-03-15: qty 30, 33d supply, fill #0
  Filled 2022-06-20: qty 30, 33d supply, fill #1

## 2022-03-15 NOTE — Telephone Encounter (Signed)
Are you all able to check and see is a PA is needed for the patients Freestyle Libre 3

## 2022-03-16 ENCOUNTER — Other Ambulatory Visit: Payer: Self-pay

## 2022-03-16 ENCOUNTER — Other Ambulatory Visit: Payer: Self-pay | Admitting: Physician Assistant

## 2022-03-16 DIAGNOSIS — E119 Type 2 diabetes mellitus without complications: Secondary | ICD-10-CM

## 2022-03-19 ENCOUNTER — Other Ambulatory Visit: Payer: Self-pay

## 2022-03-22 ENCOUNTER — Other Ambulatory Visit: Payer: Self-pay

## 2022-03-23 ENCOUNTER — Other Ambulatory Visit: Payer: Self-pay

## 2022-03-30 ENCOUNTER — Telehealth: Payer: Self-pay

## 2022-03-30 ENCOUNTER — Ambulatory Visit: Payer: 59 | Admitting: Cardiology

## 2022-03-30 NOTE — Telephone Encounter (Signed)
Needs non urgent follow up at some point.  Thanks MJP

## 2022-03-30 NOTE — Telephone Encounter (Signed)
Patient home blood pressure is under much better control with current antihypertensive regimen. Home weights are not reliable.   Patient is not compliant with taking daily readings. Only 5 days of readings for February.  Systolic Blood Pressure mmHg -- 161.6 ([id number] - [id number]) Diastolic Blood Pressure mmHg -- 83.7 (71.0 - 90.0) Heart Rate bpm -- 71.4 (64.0 - 79.0)  03/28/22 8:56 AM -... 168 / 88 mmHg 79 bpm  03/26/22 9:27 AM -... 173 / 85 mmHg 75 bpm  03/26/22 9:25 AM -... 173 / 83 mmHg 75 bpm  03/20/22 10:04 AM -... 176 / 90 mmHg 73 bpm  03/19/22 11:08 AM -... 154 / 85 mmHg 71 bpm  03/19/22 11:06 AM -... 156 / 86 mmHg 70 bpm  03/12/22 3:46 PM -... 143 / 71 mmHg 73 bpm  03/12/22 8:49 AM -... 164 / 84 mmHg 69 bpm   03/28/22 8:53 AM ... 114.6 lb    03/26/22 9:22 AM ... 108.0 lb    03/20/22 10:01 AM ... 143.3 lb    03/19/22 11:03 AM ... 108.0 lb    03/12/22 8:45 AM ... 132.3 lb    03/07/22 8:41 AM ... 138.9 lb    03/02/22 8:55 AM ... 136.7 lb    03/01/22 8:59 AM ... 114.6 lb    02/28/22 1:49 PM ... 116.8 lb    02/28/22 8:36 AM ..Marland Kitchen 134.5 lb    02/27/22 8:32 AM ... 132.3 lb

## 2022-03-30 NOTE — Progress Notes (Deleted)
Follow up visit  Subjective:   Colin [name], male    DOB: 1951-01-24, 72 y.o.   MRN: [id number]   HPI  No chief complaint on file.  72 year old [name] speaking patient with uncontrolled type 2 diabetes mellitus, mixed hyperlipidemia, tobacco dependence, coronary artery disease with prior MI, LCX and RCA stents in 2012, s/p CABG X 3.  LIMA LAD, RSVG PDA, OM1  (08/23/2020), HFrEF  Patient is here with his son today, who assisted with language interpretation.  Weight readings are fluctuating anywhere between 115-1 and 35 pounds, raising question of accuracy.  He has had no worsening of his exertional dyspnea or leg edema symptoms.  He has not had any recurrent chest pain symptoms.  Unfortunately, a week ago, he fell while walking outside and hurt his right shoulder.  He has had severe restriction of his right arm movement since then.    Current Outpatient Medications:  .  amLODipine (NORVASC) 5 MG tablet, Take 1 tablet (5 mg total) by mouth daily., Disp: 90 tablet, Rfl: 3 .  clopidogrel (PLAVIX) 75 MG tablet, Take 1 tablet (75 mg total) by mouth daily., Disp: 90 tablet, Rfl: 3 .  Continuous Blood Gluc Receiver (FREESTYLE LIBRE 3 READER) DEVI, Use as directed!, Disp: 1 each, Rfl: 0 .  Continuous Blood Gluc Sensor (FREESTYLE LIBRE 3 SENSOR) MISC, Use as instructed change every 14 days, Disp: 6 each, Rfl: 3 .  empagliflozin (JARDIANCE) 25 MG TABS tablet, Take 25 mg by mouth daily., Disp: , Rfl:  .  furosemide (LASIX) 20 MG tablet, Take 60 mg by mouth 2 (two) times daily., Disp: , Rfl:  .  furosemide (LASIX) 20 MG tablet, TAKE 3 TABLETS BY MOUTH TWICE A DAY, Disp: 180 tablet, Rfl: 2 .  insulin isophane & regular human KwikPen (HUMULIN 70/30 KWIKPEN) (70-30) 100 UNIT/ML KwikPen, Inject 30 units into the skin 3 times daily, Disp: 30 mL, Rfl: 3 .  isosorbide mononitrate (IMDUR) 30 MG 24 hr tablet, Take 1 tablet (30 mg total) by mouth daily., Disp: 90 tablet, Rfl: 3 .  ivabradine (CORLANOR) 5 MG  TABS tablet, Take 1 tablet (5 mg total) by mouth 2 (two) times daily with a meal., Disp: 60 tablet, Rfl: 0 .  metoprolol succinate (TOPROL-XL) 50 MG 24 hr tablet, Take 1 tablet (50 mg total) by mouth daily. Take with or immediately following a meal., Disp: 90 tablet, Rfl: 3 .  nitroGLYCERIN (NITROSTAT) 0.4 MG SL tablet, Place 1 tab (0.'4mg'$  total) under the tongue every 5 minutes as needed for chest pain, Disp: 25 tablet, Rfl: 3 .  pantoprazole (PROTONIX) 20 MG tablet, Take 1 tablet (20 mg total) by mouth daily., Disp: 90 tablet, Rfl: 3 .  rosuvastatin (CRESTOR) 5 MG tablet, Take 1 tablet (5 mg total) by mouth daily., Disp: 90 tablet, Rfl: 3   Cardiovascular & other pertient studies:  Reviewed external labs and tests, independently interpreted  EKG 02/06/2022: Sinus at 70 bpm. Old anteroseptal infarct LVH  Recent labs: 02/27/2022: Glucose 399, BUN/Cr 32/2.49. EGFR 27. Na/K 140/5.2.  BNP 286  01/23/2022: Glucose 118, BUN/Cr 28/2.14. EGFR 32. Na/K 141/3.8.  H/H 10/31. MCV 94. Platelets 184 TSH 5.0 normal  11/2021: Chol 305, TG 84, HDL 62, LDL 225 Lipoprotein (a) 373 HbA1C 11.4%   Review of Systems  Cardiovascular:  Positive for dyspnea on exertion and leg swelling. Negative for chest pain, palpitations and syncope.  Musculoskeletal:  Positive for joint pain.         There  were no vitals filed for this visit.   There is no height or weight on file to calculate BMI. There were no vitals filed for this visit.    Objective:   Physical Exam Vitals and nursing note reviewed.  Constitutional:      General: He is not in acute distress. Neck:     Vascular: No JVD.  Cardiovascular:     Rate and Rhythm: Normal rate and regular rhythm.     Heart sounds: Normal heart sounds. No murmur heard. Pulmonary:     Effort: Pulmonary effort is normal.     Breath sounds: Normal breath sounds. No wheezing or rales.  Musculoskeletal:     Right lower leg: Edema (2+) present.     Left  lower leg: Edema (2+) present.     Comments: Severe restriction of right shoulder movement, abduction possible only to about 30 degrees.            Visit diagnoses: No diagnosis found.      Assessment & Recommendations:   72 y.o. [name] speaking patient with uncontrolled type 2 diabetes mellitus, mixed hyperlipidemia, tobacco dependence, coronary artery disease with prior MI, LCX and RCA stents in 2012, s/p CABG X 3.  LIMA LAD, RSVG PDA, OM1  (08/23/2020), HFrEF  Right shoulder injury: Fall right shoulder 1 week ago.  He did not seek medical attention at that time, and has not had any imaging.  I am concerned about possible right shoulder dislocation.  With his decompensated heart failure and known CAD, I am concerned that outpatient treatment for the same will likely take a long time.  I think the best option for him would be to go to emergency room, at least have some imaging, and consider orthopedic consultation.  We can follow along to manage his heart failure and optimize him with IV diuresis, hopefully to reduce his perioperative cardiac risk, should he need any surgery.  HFrEF: Remains volume overloaded. Currently on Lasix 60 mg twice daily, metoprolol succinate 25 mg daily. Unable to add RNI/MRA/SGLT2i due to renal dysfunction.    CAD: No recurrent angina symptoms at this time.   Continue aspirin, statin. Continue metoprolol succinate 25 mg daily, Imdur 30 mg daily, amlodipine 5 mg daily.  F/u in 4 weeks        Nigel Mormon, MD Pager: [phone] Office: [phone]

## 2022-04-02 ENCOUNTER — Other Ambulatory Visit (HOSPITAL_COMMUNITY): Payer: Self-pay

## 2022-04-02 ENCOUNTER — Other Ambulatory Visit: Payer: Self-pay

## 2022-04-04 ENCOUNTER — Other Ambulatory Visit: Payer: Medicare Other

## 2022-05-24 ENCOUNTER — Telehealth: Payer: Self-pay

## 2022-05-24 ENCOUNTER — Ambulatory Visit: Payer: 59 | Admitting: Cardiology

## 2022-05-24 ENCOUNTER — Telehealth: Payer: Self-pay | Admitting: Endocrinology

## 2022-05-24 ENCOUNTER — Other Ambulatory Visit: Payer: Self-pay

## 2022-05-24 ENCOUNTER — Other Ambulatory Visit: Payer: Self-pay | Admitting: Physician Assistant

## 2022-05-24 DIAGNOSIS — E119 Type 2 diabetes mellitus without complications: Secondary | ICD-10-CM

## 2022-05-24 MED ORDER — "PEN NEEDLES 3/16"" 31G X 5 MM MISC"
4 refills | Status: AC
  Filled 2022-05-24: qty 100, 33d supply, fill #0
  Filled 2022-09-06: qty 100, 33d supply, fill #1

## 2022-05-24 NOTE — Progress Notes (Deleted)
Follow up visit  Subjective:   Colin [name], male    DOB: 08-06-1950, 72 y.o.   MRN: [id number]   HPI  No chief complaint on file.  72 year old [name] speaking patient with uncontrolled type 2 diabetes mellitus, mixed hyperlipidemia, tobacco dependence, coronary artery disease with prior MI, LCX and RCA stents in 2012, s/p CABG X 3.  LIMA LAD, RSVG PDA, OM1  (08/23/2020), HFrEF  Patient is here with his son today, who assisted with language interpretation.  Weight readings are fluctuating anywhere between 115-1 and 35 pounds, raising question of accuracy.  He has had no worsening of his exertional dyspnea or leg edema symptoms.  He has not had any recurrent chest pain symptoms.  Unfortunately, a week ago, he fell while walking outside and hurt his right shoulder.  He has had severe restriction of his right arm movement since then.    Current Outpatient Medications:    amLODipine (NORVASC) 5 MG tablet, Take 1 tablet (5 mg total) by mouth daily., Disp: 90 tablet, Rfl: 3   clopidogrel (PLAVIX) 75 MG tablet, Take 1 tablet (75 mg total) by mouth daily., Disp: 90 tablet, Rfl: 3   Continuous Blood Gluc Receiver (FREESTYLE LIBRE 3 READER) DEVI, Use as directed!, Disp: 1 each, Rfl: 0   Continuous Blood Gluc Sensor (FREESTYLE LIBRE 3 SENSOR) MISC, Use as instructed change every 14 days, Disp: 6 each, Rfl: 3   empagliflozin (JARDIANCE) 25 MG TABS tablet, Take 25 mg by mouth daily., Disp: , Rfl:    furosemide (LASIX) 20 MG tablet, Take 60 mg by mouth 2 (two) times daily., Disp: , Rfl:    furosemide (LASIX) 20 MG tablet, TAKE 3 TABLETS BY MOUTH TWICE A DAY, Disp: 180 tablet, Rfl: 2   insulin isophane & regular human KwikPen (HUMULIN 70/30 KWIKPEN) (70-30) 100 UNIT/ML KwikPen, Inject 30 units into the skin 3 times daily, Disp: 30 mL, Rfl: 3   isosorbide mononitrate (IMDUR) 30 MG 24 hr tablet, Take 1 tablet (30 mg total) by mouth daily., Disp: 90 tablet, Rfl: 3   ivabradine (CORLANOR) 5 MG TABS  tablet, Take 1 tablet (5 mg total) by mouth 2 (two) times daily with a meal., Disp: 60 tablet, Rfl: 0   metoprolol succinate (TOPROL-XL) 50 MG 24 hr tablet, Take 1 tablet (50 mg total) by mouth daily. Take with or immediately following a meal., Disp: 90 tablet, Rfl: 3   nitroGLYCERIN (NITROSTAT) 0.4 MG SL tablet, Place 1 tab (0.4mg  total) under the tongue every 5 minutes as needed for chest pain, Disp: 25 tablet, Rfl: 3   pantoprazole (PROTONIX) 20 MG tablet, Take 1 tablet (20 mg total) by mouth daily., Disp: 90 tablet, Rfl: 3   rosuvastatin (CRESTOR) 5 MG tablet, Take 1 tablet (5 mg total) by mouth daily., Disp: 90 tablet, Rfl: 3   Cardiovascular & other pertient studies:  Reviewed external labs and tests, independently interpreted  EKG 02/06/2022: Sinus at 70 bpm. Old anteroseptal infarct LVH  Recent labs: 02/27/2022: Glucose 399, BUN/Cr 32/2.49. EGFR 27. Na/K 140/5.2.  BNP 286  01/23/2022: Glucose 118, BUN/Cr 28/2.14. EGFR 32. Na/K 141/3.8.  H/H 10/31. MCV 94. Platelets 184 TSH 5.0 normal  11/2021: Chol 305, TG 84, HDL 62, LDL 225 Lipoprotein (a) 373 HbA1C 11.4%   Review of Systems  Cardiovascular:  Positive for dyspnea on exertion and leg swelling. Negative for chest pain, palpitations and syncope.  Musculoskeletal:  Positive for joint pain.         There  were no vitals filed for this visit.   There is no height or weight on file to calculate BMI. There were no vitals filed for this visit.    Objective:   Physical Exam Vitals and nursing note reviewed.  Constitutional:      General: He is not in acute distress. Neck:     Vascular: No JVD.  Cardiovascular:     Rate and Rhythm: Normal rate and regular rhythm.     Heart sounds: Normal heart sounds. No murmur heard. Pulmonary:     Effort: Pulmonary effort is normal.     Breath sounds: Normal breath sounds. No wheezing or rales.  Musculoskeletal:     Right lower leg: Edema (2+) present.     Left lower leg:  Edema (2+) present.     Comments: Severe restriction of right shoulder movement, abduction possible only to about 30 degrees.             Visit diagnoses: No diagnosis found.      Assessment & Recommendations:   72 y.o. [name] speaking patient with uncontrolled type 2 diabetes mellitus, mixed hyperlipidemia, tobacco dependence, coronary artery disease with prior MI, LCX and RCA stents in 2012, s/p CABG X 3.  LIMA LAD, RSVG PDA, OM1  (08/23/2020), HFrEF  *** HFrEF: Remains volume overloaded. Currently on Lasix 60 mg twice daily, metoprolol succinate 25 mg daily. Unable to add RNI/MRA/SGLT2i due to renal dysfunction.    CAD: No recurrent angina symptoms at this time.   Continue aspirin, statin. Continue metoprolol succinate 25 mg daily, Imdur 30 mg daily, amlodipine 5 mg daily.  F/u in 4 weeks        [name], MD Pager: [phone] Office: [phone]

## 2022-05-24 NOTE — Progress Notes (Signed)
Follow up visit  Subjective:   Colin [name], male    DOB: [birth year], 72 y.o.   MRN: [id number]   HPI  Chief Complaint  Patient presents with   Coronary Artery Disease   Follow-up    94 week   72 year old [name] speaking patient with uncontrolled type 2 diabetes mellitus, mixed hyperlipidemia, tobacco dependence, coronary artery disease with prior MI, LCX and RCA stents in 2012, s/p CABG X 3.  LIMA LAD, RSVG PDA, OM1  (08/23/2020), HFrEF   Note Patient is not compliant with daily BP or weight checks.    Blood pressure and heart rate                                     05/15/22 9:11 AM           195      /           98        74                                 04/30/22 8:03 AM         149      /           82        68                                 04/11/22 9:52 AM           162      /           83        71                                 04/05/22 11:54 AM       136      /           75        83                                 04/05/22 11:48 AM       115      /           68        83                                 04/05/22 9:00 AM         165      /           93        80                                 03/30/22 8:17 AM         129      /           69        80  03/28/22 8:56 AM         168      /           88        79   Weight lb  --   137.6 (132.3 - 143.3)  After last office visit, it scented to ER for evaluation of right shoulder injury.  Fortunately, there was no fracture or dislocation. His dyspnea has improved. Leg edema has resolved. Recently, he has had left abdominal area pain, no relation to exertion, lasting almost all day. No change in bowel.bladder habits.    Current Outpatient Medications:    amLODipine (NORVASC) 5 MG tablet, Take 1 tablet (5 mg total) by mouth daily., Disp: 90 tablet, Rfl: 3   clopidogrel (PLAVIX) 75 MG tablet, Take 1 tablet (75 mg total) by mouth daily., Disp: 90 tablet, Rfl: 3   Continuous Blood Gluc Receiver  (FREESTYLE LIBRE 3 READER) DEVI, Use as directed!, Disp: 1 each, Rfl: 0   Continuous Blood Gluc Sensor (FREESTYLE LIBRE 3 SENSOR) MISC, Use as instructed change every 14 days, Disp: 6 each, Rfl: 3   empagliflozin (JARDIANCE) 25 MG TABS tablet, Take 25 mg by mouth daily., Disp: , Rfl:    furosemide (LASIX) 20 MG tablet, Take 60 mg by mouth 2 (two) times daily., Disp: , Rfl:    furosemide (LASIX) 20 MG tablet, TAKE 3 TABLETS BY MOUTH TWICE A DAY, Disp: 180 tablet, Rfl: 2   insulin isophane & regular human KwikPen (HUMULIN 70/30 KWIKPEN) (70-30) 100 UNIT/ML KwikPen, Inject 30 units into the skin 3 times daily, Disp: 30 mL, Rfl: 3   Insulin Pen Needle (PEN NEEDLES 3/16") 31G X 5 MM MISC, Uses three times daily with insulin., Disp: 100 each, Rfl: 4   isosorbide mononitrate (IMDUR) 30 MG 24 hr tablet, Take 1 tablet (30 mg total) by mouth daily., Disp: 90 tablet, Rfl: 3   ivabradine (CORLANOR) 5 MG TABS tablet, Take 1 tablet (5 mg total) by mouth 2 (two) times daily with a meal., Disp: 60 tablet, Rfl: 0   metoprolol succinate (TOPROL-XL) 50 MG 24 hr tablet, Take 1 tablet (50 mg total) by mouth daily. Take with or immediately following a meal., Disp: 90 tablet, Rfl: 3   nitroGLYCERIN (NITROSTAT) 0.4 MG SL tablet, Place 1 tab (0.4mg  total) under the tongue every 5 minutes as needed for chest pain, Disp: 25 tablet, Rfl: 3   pantoprazole (PROTONIX) 20 MG tablet, Take 1 tablet (20 mg total) by mouth daily., Disp: 90 tablet, Rfl: 3   rosuvastatin (CRESTOR) 5 MG tablet, Take 1 tablet (5 mg total) by mouth daily., Disp: 90 tablet, Rfl: 3   Cardiovascular & other pertient studies:  Reviewed external labs and tests, independently interpreted  EKG 02/06/2022: Sinus at 70 bpm. Old anteroseptal infarct LVH  Recent labs: 02/27/2022: Glucose 399, BUN/Cr 32/2.49. EGFR 27. Na/K 140/5.2.  BNP 286  01/23/2022: Glucose 118, BUN/Cr 28/2.14. EGFR 32. Na/K 141/3.8.  H/H 10/31. MCV 94. Platelets 184 TSH 5.0  normal  11/2021: Chol 305, TG 84, HDL 62, LDL 225 Lipoprotein (a) 373 HbA1C 11.4%   Review of Systems  Cardiovascular:  Negative for chest pain, dyspnea on exertion, leg swelling, palpitations and syncope.  Musculoskeletal:  Negative for joint pain.         Vitals:   05/30/22 1134  BP: (!) 149/67  Pulse: 70  Resp: 16  SpO2: 96%    Body mass index is 25.15 kg/m. Filed Weights  05/30/22 1134  Weight: 142 lb (64.4 kg)     Objective:   Physical Exam Vitals and nursing note reviewed.  Constitutional:      General: He is not in acute distress. Neck:     Vascular: No JVD.  Cardiovascular:     Rate and Rhythm: Normal rate and regular rhythm.     Heart sounds: Normal heart sounds. No murmur heard. Pulmonary:     Effort: Pulmonary effort is normal.     Breath sounds: Normal breath sounds. No wheezing or rales.  Musculoskeletal:     Right lower leg: No edema.     Left lower leg: No edema.             Visit diagnoses:   ICD-10-CM   1. Coronary artery disease involving native coronary artery of native heart without angina pectoris  I25.10 Lipid panel    2. HFrEF (heart failure with reduced ejection fraction)  I50.20 Comprehensive Metabolic Panel (CMET)    Pro b natriuretic peptide (BNP)[facility])    3. Left upper quadrant abdominal pain  R10.12          Assessment & Recommendations:   72 y.o. [name] speaking patient with uncontrolled type 2 diabetes mellitus, mixed hyperlipidemia, tobacco dependence, coronary artery disease with prior MI, LCX and RCA stents in 2012, s/p CABG X 3.  LIMA LAD, RSVG PDA, OM1  (08/23/2020), HFrEF  Left abdominal pain: Likely musculoskeletal.  Okay to take 1-2 Tylenol pills for now. Recommend f/u w/PCP.   HFrEF: Clinically appears euvolemic. Patient does not have his medications or his medicatio  list with him today but says that he will be running out of medications in a week or so.  Will  check labs and refill medications based on the results.   CAD: No recurrent angina symptoms at this time.   Continue aspirin, statin and current anti anginal therapy.   F/u in 4 weeks        [name], MD Pager: [phone] Office: [phone]

## 2022-05-24 NOTE — Telephone Encounter (Signed)
Patient is not compliant with daily BP or weight checks.   Blood pressure and heart rate     05/15/22 9:11 AM 195 / 98 74     04/30/22 8:03 AM 149 / 82 68     04/11/22 9:52 AM 162 / 83 71     04/05/22 11:54 AM 136 / 75 83     04/05/22 11:48 AM 115 / 68 83     04/05/22 9:00 AM 165 / 93 80     03/30/22 8:17 AM 129 / 69 80     03/28/22 8:56 AM 168 / 88 79  Weight lb  --   137.6 (132.3 - 143.3)

## 2022-05-24 NOTE — Telephone Encounter (Signed)
MEDICATION: Pen Needles - quantity 100  PHARMACY:  [facility]  HAS THE PATIENT CONTACTED THEIR PHARMACY?  yes  IS THIS A 90 DAY SUPPLY : unknown  IS PATIENT OUT OF MEDICATION: No  IF NOT; HOW MUCH IS LEFT: 3  LAST APPOINTMENT DATE: /09/2022  NEXT APPOINTMENT DATE:@05 /15/24  DO WE HAVE YOUR PERMISSION TO LEAVE A DETAILED MESSAGE?: yes  OTHER COMMENTS:    **Let patient know to contact pharmacy at the end of the day to make sure medication is ready. **  ** Please notify patient to allow 48-72 hours to process**  **Encourage patient to contact the pharmacy for refills or they can request refills through [facility]**

## 2022-05-30 ENCOUNTER — Encounter: Payer: Self-pay | Admitting: Cardiology

## 2022-05-30 ENCOUNTER — Ambulatory Visit: Payer: 59 | Admitting: Cardiology

## 2022-05-30 VITALS — BP 149/67 | HR 70 | Resp 16 | Ht 63.0 in | Wt 142.0 lb

## 2022-05-30 DIAGNOSIS — I5020 Unspecified systolic (congestive) heart failure: Secondary | ICD-10-CM

## 2022-05-30 DIAGNOSIS — I2510 Atherosclerotic heart disease of native coronary artery without angina pectoris: Secondary | ICD-10-CM

## 2022-05-30 DIAGNOSIS — R1012 Left upper quadrant pain: Secondary | ICD-10-CM

## 2022-05-30 HISTORY — DX: Left upper quadrant pain: R10.12

## 2022-06-14 ENCOUNTER — Telehealth (INDEPENDENT_AMBULATORY_CARE_PROVIDER_SITE_OTHER): Payer: Self-pay | Admitting: Primary Care

## 2022-06-14 NOTE — Telephone Encounter (Signed)
Called patient to schedule Medicare Annual Wellness Visit (AWV). No voicemail available to leave a message.  Last date of AWV: due 04/05/2016 awvi per [facility]  Please schedule an appointment at any time with Abby, NHA. .  If any questions, please contact me at [phone].  Thank you,  Judeth Cornfield,  AMB Clinical Support [facility] AWV Program Direct Dial ??[phone]

## 2022-06-15 ENCOUNTER — Other Ambulatory Visit (INDEPENDENT_AMBULATORY_CARE_PROVIDER_SITE_OTHER): Payer: 59

## 2022-06-15 DIAGNOSIS — Z794 Long term (current) use of insulin: Secondary | ICD-10-CM

## 2022-06-15 DIAGNOSIS — E785 Hyperlipidemia, unspecified: Secondary | ICD-10-CM | POA: Diagnosis not present

## 2022-06-15 DIAGNOSIS — E1165 Type 2 diabetes mellitus with hyperglycemia: Secondary | ICD-10-CM | POA: Diagnosis not present

## 2022-06-15 LAB — BASIC METABOLIC PANEL
BUN: 27 mg/dL — ABNORMAL HIGH (ref 6–23)
CO2: 25 mEq/L (ref 19–32)
Calcium: 8.3 mg/dL — ABNORMAL LOW (ref 8.4–10.5)
Chloride: 105 mEq/L (ref 96–112)
Creatinine, Ser: 2.28 mg/dL — ABNORMAL HIGH (ref 0.40–1.50)
GFR: 28.04 mL/min — ABNORMAL LOW (ref >60.00)
Glucose, Bld: 289 mg/dL — ABNORMAL HIGH (ref 70–99)
Potassium: 3.5 mEq/L (ref 3.5–5.1)
Sodium: 139 mEq/L (ref 135–145)

## 2022-06-15 LAB — LDL CHOLESTEROL, DIRECT: Direct LDL: 214 mg/dL

## 2022-06-15 LAB — HEMOGLOBIN A1C: Hgb A1c MFr Bld: 13.3 % — ABNORMAL HIGH (ref 4.6–6.5)

## 2022-06-20 ENCOUNTER — Encounter: Payer: Self-pay | Admitting: "Endocrinology

## 2022-06-20 ENCOUNTER — Other Ambulatory Visit: Payer: Self-pay

## 2022-06-20 ENCOUNTER — Ambulatory Visit (INDEPENDENT_AMBULATORY_CARE_PROVIDER_SITE_OTHER): Payer: 59 | Admitting: "Endocrinology

## 2022-06-20 VITALS — BP 160/90 | HR 74 | Ht 63.0 in | Wt 133.6 lb

## 2022-06-20 DIAGNOSIS — E7800 Pure hypercholesterolemia, unspecified: Secondary | ICD-10-CM

## 2022-06-20 DIAGNOSIS — Z794 Long term (current) use of insulin: Secondary | ICD-10-CM

## 2022-06-20 DIAGNOSIS — E1165 Type 2 diabetes mellitus with hyperglycemia: Secondary | ICD-10-CM

## 2022-06-20 MED ORDER — ROSUVASTATIN CALCIUM 10 MG PO TABS
10.0000 mg | ORAL_TABLET | Freq: Every day | ORAL | 0 refills | Status: DC
  Filled 2022-06-20: qty 90, 90d supply, fill #0

## 2022-06-20 NOTE — Progress Notes (Signed)
Outpatient Endocrinology Note Colin [place], MD  06/20/22   Colin [name] 12/24/1950 [id number]  Referring Provider: Grayce Sessions, NP Primary Care Provider: Grayce Sessions, NP Reason for consultation: Subjective   Assessment & Plan  Diagnoses and all orders for this visit:  Uncontrolled type 2 diabetes mellitus with hyperglycemia (HCC)  Long-term insulin use (HCC)  Pure hypercholesterolemia  Other orders -     rosuvastatin (CRESTOR) 10 MG tablet; Take 1 tablet (10 mg total) by mouth daily.    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 Will recommend for the following change of medications to: Unable to make any changes in the insulin regimen given lack of history and blood sugar data despite spending incredible amount of time via language translator Put a Dexcom device in the office today and have patient come back in 10 days to download Dexcom data and adjust insulin based on that Patient was repeatedly instructed to bring all of his medications to the office visits and check blood sugar 3 times a day and bring his log, unclear if patient plans to do any of this because patient thinks that he knows how to manage his diabetes via diet  No known contraindications to any of above medications  Hyperlipidemia -Last LDL off goal: 214 -Not taking rosuvastatin 5 mg daily -Started rosuvastatin 10 mg daily -Follow low fat diet and exercise    -Blood pressure goal <140/90 -Protein creatinine ratio elevated at 1 in 01/2022 -Not on ACE/ARB -Defer to nephrology -diet changes including salt restriction -limit eating outside -counseled BP targets per standards of diabetes care -Uncontrolled blood pressure can lead to retinopathy, nephropathy and cardiovascular and atherosclerotic heart disease  Reviewed and counseled on: -A1C target -Blood sugar targets -Complications of uncontrolled diabetes   -Checking blood sugar before meals and bedtime and bring log next visit -All medications with mechanism of action and side effects -Hypoglycemia management: rule of 15's, Glucagon Emergency Kit and medical alert ID -low-carb low-fat plate-method diet -At least 20 minutes of physical activity per day -Annual dilated retinal eye exam and foot exam -compliance and follow up needs -follow up as scheduled or earlier if problem gets worse  Call if blood sugar is less than 70 or consistently above 250    Take a 15 gm snack of carbohydrate at bedtime before you go to sleep if your blood sugar is less than 100.    If you are going to fast after midnight for a test or procedure, ask your physician for instructions on how to reduce/decrease your insulin dose.    Call if blood sugar is less than 70 or consistently above 250  -Treating a low sugar by rule of 15  (15 gms of sugar every 15 min until sugar is more than 70) If you feel your sugar is low, test your sugar to be sure If your sugar is low (less than 70), then take 15 grams of a fast acting Carbohydrate (3-4 glucose tablets or glucose gel or 4 ounces of juice or regular soda) Recheck your sugar 15 min after treating low to make sure it is more than 70 If sugar is still less than 70, treat again with 15 grams of carbohydrate          Don't drive the hour of hypoglycemia  If unconscious/unable to eat or drink by mouth, use glucagon injection or nasal spray baqsimi and call 911. Can repeat again  in 15 min if still unconscious.  Return in about 6 weeks (around 08/01/2022).   I have reviewed current medications, nurse's notes, allergies, vital signs, past medical and surgical history, family medical history, and social history for this encounter. Counseled patient on symptoms, examination findings, lab findings, imaging results, treatment decisions and monitoring and prognosis. The patient understood the recommendations and agrees with the  treatment plan. All questions regarding treatment plan were fully answered.  Colin [place], MD  06/20/22    History of Present Illness Colin [name] is a 72 y.o. year old male who presents for evaluation of Type 2 diabetes mellitus.  Colin [name] was first diagnosed in 2006.    Spoken to patient on phone via language translator Patient has a tendency to keep talking on top of the translator which made the communication very limited Patient did not bring any medications, although in the chart it is mentioned that he is on Jardiance 25 mg every day as well as rosuvastatin 5 mg every day, all the patient keeps saying repeatedly as I take insulin.  Patient does not know the name of his insulin Patient also has libre prescription in the chart but has no reason of why he is not checking blood sugar or what happened to the meter, he reports cost is not an issue More than the language, patient's compliance and limited communication remains the primary issue with his care  Home diabetes regimen: Insulin regimen:   Humulin 70/30, 30 units bid  Not taking Jardiance 25 mg daily  Previous history:  He was started on insulin in 2021 and by history has been taking NPH, Lantus and NovoLog at various times  COMPLICATIONS +  NSTEMI +  nephropathy (GFR 28)  BLOOD SUGAR DATA Doesn't check BG at home    Physical Exam  BP (!) 160/90 (BP Location: Left Arm, Patient Position: Sitting, Cuff Size: Normal)   Pulse 74   Ht 5\' 3"  (1.6 m)   Wt 133 lb 9.6 oz (60.6 kg)   SpO2 97%   BMI 23.67 kg/m    Constitutional: well developed, well nourished Head: normocephalic, atraumatic Eyes: sclera anicteric, no redness Neck: supple Lungs: normal respiratory effort Neurology: alert and oriented Skin: dry, no appreciable rashes Musculoskeletal: no appreciable defects Psychiatric: normal mood and affect Diabetic Foot Exam - Simple   No data filed      Current Medications Patient's Medications  New  Prescriptions   No medications on file  Previous Medications   AMLODIPINE (NORVASC) 5 MG TABLET    Take 1 tablet (5 mg total) by mouth daily.   CLOPIDOGREL (PLAVIX) 75 MG TABLET    Take 1 tablet (75 mg total) by mouth daily.   CONTINUOUS BLOOD GLUC RECEIVER (FREESTYLE LIBRE 3 READER) DEVI    Use as directed!   CONTINUOUS BLOOD GLUC SENSOR (FREESTYLE LIBRE 3 SENSOR) MISC    Use as instructed change every 14 days   EMPAGLIFLOZIN (JARDIANCE) 25 MG TABS TABLET    Take 25 mg by mouth daily.   FUROSEMIDE (LASIX) 20 MG TABLET    Take 60 mg by mouth 2 (two) times daily.   FUROSEMIDE (LASIX) 20 MG TABLET    TAKE 3 TABLETS BY MOUTH TWICE A DAY   INSULIN ISOPHANE & REGULAR HUMAN KWIKPEN (HUMULIN 70/30 KWIKPEN) (70-30) 100 UNIT/ML KWIKPEN    Inject 30 units into the skin 3 times daily   INSULIN PEN NEEDLE (PEN NEEDLES 3/16") 31G X 5 MM MISC  Uses three times daily with insulin.   ISOSORBIDE MONONITRATE (IMDUR) 30 MG 24 HR TABLET    Take 1 tablet (30 mg total) by mouth daily.   IVABRADINE (CORLANOR) 5 MG TABS TABLET    Take 1 tablet (5 mg total) by mouth 2 (two) times daily with a meal.   METOPROLOL SUCCINATE (TOPROL-XL) 50 MG 24 HR TABLET    Take 1 tablet (50 mg total) by mouth daily. Take with or immediately following a meal.   NITROGLYCERIN (NITROSTAT) 0.4 MG SL TABLET    Place 1 tab (0.4mg  total) under the tongue every 5 minutes as needed for chest pain   PANTOPRAZOLE (PROTONIX) 20 MG TABLET    Take 1 tablet (20 mg total) by mouth daily.  Modified Medications   Modified Medication Previous Medication   ROSUVASTATIN (CRESTOR) 10 MG TABLET rosuvastatin (CRESTOR) 5 MG tablet      Take 1 tablet (10 mg total) by mouth daily.    Take 1 tablet (5 mg total) by mouth daily.  Discontinued Medications   No medications on file    Allergies No Known Allergies  Past Medical History Past Medical History:  Diagnosis Date   Arrhythmia    CAD (coronary artery disease)    stent x 2   Carotid artery occlusion     Diabetes mellitus    Dyslipidemia    Essential hypertension     Past Surgical History Past Surgical History:  Procedure Laterality Date   CARDIAC CATHETERIZATION     01/2010 ; 04/2010   CORONARY ARTERY BYPASS GRAFT N/A 08/23/2020   Procedure: CORONARY ARTERY BYPASS GRAFTING (CABG) x 3 ON CARDIOPULMONARY BYPASS USING EVH AND PARTIALLY OPEN HARVESTED LEFT GREATER SAPHENOUS VEIN AND LEFT INTERNAL MAMMARY ARTERY.  LIMA TO LAD, SVG TO OM, SVG TO PDA;  Surgeon: Corliss Skains, MD;  Location: [facility] OR;  Service: Open Heart Surgery;  Laterality: N/A;   RIGHT HEART CATH N/A 12/19/2021   Procedure: RIGHT HEART CATH;  Surgeon: [name], MD;  Location: [facility] INVASIVE CV LAB;  Service: Cardiovascular;  Laterality: N/A;   RIGHT/LEFT HEART CATH AND CORONARY ANGIOGRAPHY N/A 08/22/2020   Procedure: RIGHT/LEFT HEART CATH AND CORONARY ANGIOGRAPHY;  Surgeon: [name], MD;  Location: [facility] IN[facility]AS[facility] CV [facility];  Service: Cardiovascular;  Laterality: N/A;   TEE WITHOUT CARDIOVERSION N/A 08/23/2020   Procedure: TRANSESOPHAGEAL ECHOCARDIOGRAM (TEE);  Surgeon: Corliss Skains, MD;  Location: [facility] OR;  Service: Open Heart Surgery;  Laterality: N/A;   TEMPORARY PACEMAKER N/A 08/22/2020   Procedure: TEMPORARY PACEMAKER;  Surgeon: [name], MD;  Location: [facility] INVAS[facility] CV LAB;  Service: Cardiovascular;  Laterality: N/A;   VEIN HARVEST Left 08/23/2020   Procedure: ENDOVEIN HARVEST AND PARTIALLY OPEN GREATER SAPHENOUS VEIN HARVESTING;  Surgeon: Corliss Skains, MD;  Location: [facility] OR;  Service: Open Heart Surgery;  Laterality: Left;    Family History family history is not on file.  Social History Social History   Socioeconomic History   Marital status: Married    Spouse name: Not on file   Number of children: Not on file   Years of education: Not on file   Highest education level: Not on file  Occupational History   Not on file  Tobacco Use   Smoking status: Former     Packs/day: 0.25    Years: 50.00    Additional pack years: 0.00    Total pack years: 12.50    Types: Cigarettes    Quit date: 08/2020  Years since quitting: 1.8   Smokeless tobacco: Never  Vaping Use   Vaping Use: Never used  Substance and Sexual Activity   Alcohol use: No   Drug use: No   Sexual activity: Not on file  Other Topics Concern   Not on file  Social History Narrative   Not on file   Social Determinants of Health   Financial Resource Strain: Not on file  Food Insecurity: No Food Insecurity (12/24/2021)   Hunger Vital Sign    Worried About Running Out of Food in the Last Year: Never true    Ran Out of Food in the Last Year: Never true  Transportation Needs: No Transportation Needs (12/24/2021)   PRAPARE - Administrator, Civil Service (Medical): No    Lack of Transportation (Non-Medical): No  Physical Activity: Not on file  Stress: Not on file  Social Connections: Not on file  Intimate Partner Violence: Not At Risk (12/24/2021)   Humiliation, Afraid, Rape, and Kick questionnaire    Fear of Current or Ex-Partner: No    Emotionally Abused: No    Physically Abused: No    Sexually Abused: No    Lab Results  Component Value Date   HGBA1C 13.3 (H) 06/15/2022   Lab Results  Component Value Date   CHOL 305 (H) 11/08/2021   Lab Results  Component Value Date   HDL 62.70 11/08/2021   Lab Results  Component Value Date   LDLCALC 225 (H) 11/08/2021   Lab Results  Component Value Date   TRIG 84.0 11/08/2021   Lab Results  Component Value Date   CHOLHDL 5 11/08/2021   Lab Results  Component Value Date   CREATININE 2.28 (H) 06/15/2022   Lab Results  Component Value Date   GFR 28.04 (L) 06/15/2022   Lab Results  Component Value Date   MICROALBUR 373.3 (H) 06/29/2021      Component Value Date/Time   NA 139 06/15/2022 0901   NA 140 02/27/2022 1605   K 3.5 06/15/2022 0901   CL 105 06/15/2022 0901   CO2 25 06/15/2022 0901   GLUCOSE  289 (H) 06/15/2022 0901   BUN 27 (H) 06/15/2022 0901   BUN 32 (H) 02/27/2022 1605   CREATININE 2.28 (H) 06/15/2022 0901   CALCIUM 8.3 (L) 06/15/2022 0901   PROT 5.7 (L) 03/02/2022 1637   PROT 6.1 01/11/2022 1625   ALBUMIN 2.1 (L) 03/02/2022 1637   ALBUMIN 2.8 (L) 01/11/2022 1625   AST 22 03/02/2022 1637   ALT 13 03/02/2022 1637   ALKPHOS 60 03/02/2022 1637   BILITOT 0.3 03/02/2022 1637   BILITOT <0.2 01/11/2022 1625   GFRNONAA 30 (L) 03/02/2022 1637   GFRAA 75 12/17/2019 1101      Latest Ref Rng & Units 06/15/2022    9:01 AM 03/02/2022    4:37 PM 02/27/2022    4:05 PM  BMP  Glucose 70 - 99 mg/dL 161  096  045   BUN 6 - 23 mg/dL 27  28  32   Creatinine 0.40 - 1.50 mg/dL 4.09  8.11  9.14   BUN/Creat Ratio 10 - 24   13   Sodium 135 - 145 mEq/L 139  141  140   Potassium 3.5 - 5.1 mEq/L 3.5  3.7  5.2   Chloride 96 - 112 mEq/L 105  104  100   CO2 19 - 32 mEq/L 25  30  27    Calcium 8.4 - 10.5 mg/dL 8.3  8.2  8.4        Component Value Date/Time   WBC 6.7 03/02/2022 1637   RBC 3.86 (L) 03/02/2022 1637   HGB 12.0 (L) 03/02/2022 1637   HGB 13.8 11/11/2020 1136   HCT 36.4 (L) 03/02/2022 1637   HCT 41.1 11/11/2020 1136   PLT 251 03/02/2022 1637   PLT 213 11/11/2020 1136   MCV 94.3 03/02/2022 1637   MCV 89 11/11/2020 1136   MCH 31.1 03/02/2022 1637   MCHC 33.0 03/02/2022 1637   RDW 13.3 03/02/2022 1637   RDW 14.0 11/11/2020 1136   LYMPHSABS 1.5 03/02/2022 1637   LYMPHSABS 1.4 12/17/2019 1101   MONOABS 0.4 03/02/2022 1637   EOSABS 0.3 03/02/2022 1637   EOSABS 0.4 12/17/2019 1101   BASOSABS 0.1 03/02/2022 1637   BASOSABS 0.1 12/17/2019 1101     Parts of this note may have been dictated using voice recognition software. There may be variances in spelling and vocabulary which are unintentional. Not all errors are proofread. Please notify the Thereasa Parkin if any discrepancies are noted or if the meaning of any statement is not clear.

## 2022-06-21 ENCOUNTER — Encounter (INDEPENDENT_AMBULATORY_CARE_PROVIDER_SITE_OTHER): Payer: Self-pay

## 2022-06-29 ENCOUNTER — Other Ambulatory Visit: Payer: Self-pay

## 2022-06-29 ENCOUNTER — Encounter: Payer: Self-pay | Admitting: Cardiology

## 2022-06-29 ENCOUNTER — Ambulatory Visit: Payer: 59

## 2022-06-29 ENCOUNTER — Ambulatory Visit (INDEPENDENT_AMBULATORY_CARE_PROVIDER_SITE_OTHER): Payer: 59 | Admitting: "Endocrinology

## 2022-06-29 ENCOUNTER — Encounter: Payer: 59 | Admitting: Cardiology

## 2022-06-29 ENCOUNTER — Encounter: Payer: Self-pay | Admitting: "Endocrinology

## 2022-06-29 ENCOUNTER — Telehealth: Payer: Self-pay

## 2022-06-29 VITALS — BP 120/80 | HR 73

## 2022-06-29 DIAGNOSIS — E1165 Type 2 diabetes mellitus with hyperglycemia: Secondary | ICD-10-CM | POA: Diagnosis not present

## 2022-06-29 DIAGNOSIS — E7800 Pure hypercholesterolemia, unspecified: Secondary | ICD-10-CM

## 2022-06-29 DIAGNOSIS — Z794 Long term (current) use of insulin: Secondary | ICD-10-CM | POA: Diagnosis not present

## 2022-06-29 MED ORDER — EMPAGLIFLOZIN 25 MG PO TABS
25.0000 mg | ORAL_TABLET | Freq: Every day | ORAL | 1 refills | Status: DC
  Filled 2022-06-29: qty 90, 90d supply, fill #0

## 2022-06-29 NOTE — Patient Instructions (Addendum)
Take Humulin 70/30, 45 units before break fast and 40 units before dinner  ___________________   Goals of DM therapy:  Morning Fasting blood sugar: 80-140  Blood sugar before meals: 80-140 Bed time blood sugar: 100-150  A1C <7%, limited only by hypoglycemia  1.Diabetes medications and their side effects discussed, including hypoglycemia    2. Check blood glucose:  a) Always check blood sugars before driving. Please see below (under hypoglycemia) on how to manage b) Check a minimum of 3 times/day or more as needed when having symptoms of hypoglycemia.   c) Try to check blood glucose before sleeping/in the middle of the night to ensure that it is remaining stable and not dropping less than 100 d) Check blood glucose more often if sick  3. Diet: a) 3 meals per day schedule b: Restrict carbs to 60-70 grams (4 servings) per meal c) Colorful vegetables - 3 servings a day, and low sugar fruit 2 servings/day Plate control method: 1/4 plate protein, 1/4 starch, 1/2 green, yellow, or red vegetables d) Avoid carbohydrate snacks unless hypoglycemic episode, or increased physical activity  4. Regular exercise as tolerated, preferably 3 or more hours a week  5. Hypoglycemia: a)  Do not drive or operate machinery without first testing blood glucose to assure it is over 90 mg%, or if dizzy, lightheaded, not feeling normal, etc, or  if foot or leg is numb or weak. b)  If blood glucose less than 70, take four 5gm Glucose tabs or 15-30 gm Glucose gel.  Repeat every 15 min as needed until blood sugar is >100 mg/dl. If hypoglycemia persists then call 911.   6. Sick day management: a) Check blood glucose more often b) Continue usual therapy if blood sugars are elevated.   7. Contact the doctor immediately if blood glucose is frequently <60 mg/dl, or an episode of severe hypoglycemia occurs (where someone had to give you glucose/  glucagon or if you passed out from a low blood glucose), or if blood  glucose is persistently >350 mg/dl, for further management  8. A change in level of physical activity or exercise and a change in diet may also affect your blood sugar. Check blood sugars more often and call if needed.  Instructions: 1. Bring glucose meter, blood glucose records on every visit for review 2. Continue to follow up with primary care physician and other providers for medical care 3. Yearly eye  and foot exam 4. Please get blood work done prior to the next appointment

## 2022-06-29 NOTE — Progress Notes (Signed)
Dexcom was downloaded. Provider reviewed with patient at appt.

## 2022-06-29 NOTE — Progress Notes (Deleted)
Follow up visit  Subjective:   Colin [name], male    DOB: 1950-11-16, 72 y.o.   MRN: [id number]   HPI  No chief complaint on file.  72 year old [name] speaking patient with uncontrolled type 2 diabetes mellitus, mixed hyperlipidemia, tobacco dependence, coronary artery disease with prior MI, LCX and RCA stents in 2012, s/p CABG X 3.  LIMA LAD, RSVG PDA, OM1  (08/23/2020), HFrEF Patient does not have any BP nor weight readings to share for the month of May. He has a history of noncompliance in the RPM program. We have attempted to reach the patient five times this month to discuss, but we had to leave a voicemail each time.    There is no clear data from previous months whether the patient's vitals are controlled. Please discuss possible discontinuation in the program.      Current Outpatient Medications:    amLODipine (NORVASC) 5 MG tablet, Take 1 tablet (5 mg total) by mouth daily., Disp: 90 tablet, Rfl: 3   clopidogrel (PLAVIX) 75 MG tablet, Take 1 tablet (75 mg total) by mouth daily., Disp: 90 tablet, Rfl: 3   Continuous Glucose Receiver (FREESTYLE LIBRE 3 READER) DEVI, Use as directed!, Disp: 1 each, Rfl: 0   Continuous Glucose Sensor (FREESTYLE LIBRE 3 SENSOR) MISC, Use as instructed change every 14 days, Disp: 6 each, Rfl: 3   empagliflozin (JARDIANCE) 25 MG TABS tablet, Take 1 tablet (25 mg total) by mouth daily., Disp: 90 tablet, Rfl: 1   furosemide (LASIX) 20 MG tablet, Take 60 mg by mouth 2 (two) times daily., Disp: , Rfl:    furosemide (LASIX) 20 MG tablet, TAKE 3 TABLETS BY MOUTH TWICE A DAY, Disp: 180 tablet, Rfl: 2   insulin isophane & regular human KwikPen (HUMULIN 70/30 KWIKPEN) (70-30) 100 UNIT/ML KwikPen, Inject 30 units into the skin 3 times daily (Patient taking differently: Inject 30 units into the skin 2 times daily), Disp: 30 mL, Rfl: 3   Insulin Pen Needle (PEN NEEDLES 3/16") 31G X 5 MM MISC, Uses three times daily with insulin., Disp: 100 each, Rfl: 4    isosorbide mononitrate (IMDUR) 30 MG 24 hr tablet, Take 1 tablet (30 mg total) by mouth daily., Disp: 90 tablet, Rfl: 3   ivabradine (CORLANOR) 5 MG TABS tablet, Take 1 tablet (5 mg total) by mouth 2 (two) times daily with a meal., Disp: 60 tablet, Rfl: 0   metoprolol succinate (TOPROL-XL) 50 MG 24 hr tablet, Take 1 tablet (50 mg total) by mouth daily. Take with or immediately following a meal., Disp: 90 tablet, Rfl: 3   nitroGLYCERIN (NITROSTAT) 0.4 MG SL tablet, Place 1 tab (0.4mg  total) under the tongue every 5 minutes as needed for chest pain, Disp: 25 tablet, Rfl: 3   pantoprazole (PROTONIX) 20 MG tablet, Take 1 tablet (20 mg total) by mouth daily., Disp: 90 tablet, Rfl: 3   rosuvastatin (CRESTOR) 10 MG tablet, Take 1 tablet (10 mg total) by mouth daily., Disp: 90 tablet, Rfl: 0   Cardiovascular & other pertient studies:  Reviewed external labs and tests, independently interpreted  EKG 02/06/2022: Sinus at 70 bpm. Old anteroseptal infarct LVH  Recent labs: 06/15/2022: Glucose 289, BUN/Cr 27/2.28. EGFR 28. Na/K 139/3.5.  Direct LDL 214  02/27/2022: Glucose 399, BUN/Cr 32/2.49. EGFR 27. Na/K 140/5.2.  BNP 286  01/23/2022: Glucose 118, BUN/Cr 28/2.14. EGFR 32. Na/K 141/3.8.  H/H 10/31. MCV 94. Platelets 184 TSH 5.0 normal  11/2021: Chol 305, TG 84, HDL 62,  LDL 225 Lipoprotein (a) 373 HbA1C 11.4%   Review of Systems  Cardiovascular:  Negative for chest pain, dyspnea on exertion, leg swelling, palpitations and syncope.  Musculoskeletal:  Negative for joint pain.         Vitals:   06/29/22 1157 06/29/22 1200  BP: (!) 144/57 133/60  Pulse: 72 69  SpO2: 96% 96%    Body mass index is 24.27 kg/m. Filed Weights   06/29/22 1157  Weight: 137 lb (62.1 kg)     Objective:   Physical Exam Vitals and nursing note reviewed.  Constitutional:      General: He is not in acute distress. Neck:     Vascular: No JVD.  Cardiovascular:     Rate and Rhythm: Normal rate and  regular rhythm.     Heart sounds: Normal heart sounds. No murmur heard. Pulmonary:     Effort: Pulmonary effort is normal.     Breath sounds: Normal breath sounds. No wheezing or rales.  Musculoskeletal:     Right lower leg: No edema.     Left lower leg: No edema.             Visit diagnoses: No diagnosis found.      Assessment & Recommendations:   72 y.o. [name] speaking patient with uncontrolled type 2 diabetes mellitus, mixed hyperlipidemia, tobacco dependence, coronary artery disease with prior MI, LCX and RCA stents in 2012, s/p CABG X 3.  LIMA LAD, RSVG PDA, OM1  (08/23/2020), HFrEF  Left abdominal pain: Likely musculoskeletal.  Okay to take 1-2 Tylenol pills for now. Recommend f/u w/PCP.   HFrEF: Clinically appears euvolemic. Patient does not have his medications or his medicatio  list with him today but says that he will be running out of medications in a week or so.  Will check labs and refill medications based on the results.   CAD: No recurrent angina symptoms at this time.   Continue aspirin, statin and current anti anginal therapy.   F/u in 4 weeks        [name], MD Pager: [phone] Office: [phone]

## 2022-06-29 NOTE — Progress Notes (Addendum)
Outpatient Endocrinology Note Colin Union, MD  06/29/22   [facility] Jan 11, 1951 [id number]  Referring Provider: Grayce Sessions, NP Primary Care Provider: Grayce Sessions, NP Reason for consultation: Subjective   Assessment & Plan  Diagnoses and all orders for this visit:  Uncontrolled type 2 diabetes mellitus with hyperglycemia (HCC)  Long-term insulin use (HCC)  Pure hypercholesterolemia  Other orders -     empagliflozin (JARDIANCE) 25 MG TABS tablet; Take 1 tablet (25 mg total) by mouth daily.   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 Will recommend for the following change of medications to: Humulin 70/30, 45 units before break fast and 40 units before dinner: 15 MIN BEFORE MEALS  Resume jardiance 25 mg qd  Pt sent with live translator to downstairs pharmacy to pick up prescription of Josephine Igo, Jardiance and rosuvastatin if covered under insurance   Patient was repeatedly instructed to bring all of his medications to the office visits and check blood sugar 3 times a day and bring his log, pt is not interested in doing it   No known contraindications to any of above medications  Hyperlipidemia -Last LDL off goal: 214 -Not taking rosuvastatin 5 mg daily -Started rosuvastatin 10 mg daily -Follow low fat diet and exercise    -Blood pressure goal <140/90 -Protein creatinine ratio elevated at 272 in 06/2021 -Not on ACE/ARB -Defer to nephrology -diet changes including salt restriction -limit eating outside -counseled BP targets per standards of diabetes care -Uncontrolled blood pressure can lead to retinopathy, nephropathy and cardiovascular and atherosclerotic heart disease  Reviewed and counseled on: -A1C target -Blood sugar targets -Complications of uncontrolled diabetes  -Checking blood sugar before meals and bedtime and bring log next visit -All medications with mechanism  of action and side effects -Hypoglycemia management: rule of 15's, Glucagon Emergency Kit and medical alert ID -low-carb low-fat plate-method diet -At least 20 minutes of physical activity per day -Annual dilated retinal eye exam and foot exam -compliance and follow up needs -follow up as scheduled or earlier if problem gets worse  Call if blood sugar is less than 70 or consistently above 250    Take a 15 gm snack of carbohydrate at bedtime before you go to sleep if your blood sugar is less than 100.    If you are going to fast after midnight for a test or procedure, ask your physician for instructions on how to reduce/decrease your insulin dose.    Call if blood sugar is less than 70 or consistently above 250  -Treating a low sugar by rule of 15  (15 gms of sugar every 15 min until sugar is more than 70) If you feel your sugar is low, test your sugar to be sure If your sugar is low (less than 70), then take 15 grams of a fast acting Carbohydrate (3-4 glucose tablets or glucose gel or 4 ounces of juice or regular soda) Recheck your sugar 15 min after treating low to make sure it is more than 70 If sugar is still less than 70, treat again with 15 grams of carbohydrate          Don't drive the hour of hypoglycemia  If unconscious/unable to eat or drink by mouth, use glucagon injection or nasal spray baqsimi and call 911. Can repeat again in 15 min if still unconscious.  Return in about 3 months (around 09/29/2022).   I have reviewed current  medications, nurse's notes, allergies, vital signs, past medical and surgical history, family medical history, and social history for this encounter. Counseled patient on symptoms, examination findings, lab findings, imaging results, treatment decisions and monitoring and prognosis. The patient understood the recommendations and agrees with the treatment plan. All questions regarding treatment plan were fully answered.  Colin Muddy, MD   06/29/22    History of Present Illness Colin [name] is a 72 y.o. year old male who presents for f/u of Type 2 diabetes mellitus.  Colin [name] was first diagnosed in 2006.    Spoken to patient on phone via live language translator  Patient did not bring any medications  Home diabetes regimen: Insulin regimen:   Humulin 70/30, 30 units bid takes 15 min before meals  Not taking Jardiance 25 mg daily - ran out- no issues while on it  Previous history:  He was started on insulin in 2021 and by history has been taking NPH, Lantus and NovoLog at various times  COMPLICATIONS +  NSTEMI +  nephropathy (GFR 28)  BLOOD SUGAR DATA DexCom G7  BG elevated through the day, lowest around 10 am around 160   Physical Exam  BP 120/80   Pulse 73   SpO2 97%    Constitutional: well developed, well nourished Head: normocephalic, atraumatic Eyes: sclera anicteric, no redness Neck: supple Lungs: normal respiratory effort Neurology: alert and oriented Skin: dry, no appreciable rashes Musculoskeletal: no appreciable defects Psychiatric: normal mood and affect Diabetic Foot Exam - Simple   No data filed      Current Medications Patient's Medications  New Prescriptions   No medications on file  Previous Medications   AMLODIPINE (NORVASC) 5 MG TABLET    Take 1 tablet (5 mg total) by mouth daily.   CLOPIDOGREL (PLAVIX) 75 MG TABLET    Take 1 tablet (75 mg total) by mouth daily.   CONTINUOUS GLUCOSE RECEIVER (FREESTYLE LIBRE 3 READER) DEVI    Use as directed!   CONTINUOUS GLUCOSE SENSOR (FREESTYLE LIBRE 3 SENSOR) MISC    Use as instructed change every 14 days   FUROSEMIDE (LASIX) 20 MG TABLET    Take 60 mg by mouth 2 (two) times daily.   FUROSEMIDE (LASIX) 20 MG TABLET    TAKE 3 TABLETS BY MOUTH TWICE A DAY   INSULIN ISOPHANE & REGULAR HUMAN KWIKPEN (HUMULIN 70/30 KWIKPEN) (70-30) 100 UNIT/ML KWIKPEN    Inject 30 units into the skin 3 times daily   INSULIN PEN NEEDLE (PEN NEEDLES  3/16") 31G X 5 MM MISC    Uses three times daily with insulin.   ISOSORBIDE MONONITRATE (IMDUR) 30 MG 24 HR TABLET    Take 1 tablet (30 mg total) by mouth daily.   IVABRADINE (CORLANOR) 5 MG TABS TABLET    Take 1 tablet (5 mg total) by mouth 2 (two) times daily with a meal.   METOPROLOL SUCCINATE (TOPROL-XL) 50 MG 24 HR TABLET    Take 1 tablet (50 mg total) by mouth daily. Take with or immediately following a meal.   NITROGLYCERIN (NITROSTAT) 0.4 MG SL TABLET    Place 1 tab (0.4mg  total) under the tongue every 5 minutes as needed for chest pain   PANTOPRAZOLE (PROTONIX) 20 MG TABLET    Take 1 tablet (20 mg total) by mouth daily.   ROSUVASTATIN (CRESTOR) 10 MG TABLET    Take 1 tablet (10 mg total) by mouth daily.  Modified Medications   Modified Medication Previous Medication  EMPAGLIFLOZIN (JARDIANCE) 25 MG TABS TABLET empagliflozin (JARDIANCE) 25 MG TABS tablet      Take 1 tablet (25 mg total) by mouth daily.    Take 25 mg by mouth daily.  Discontinued Medications   No medications on file    Allergies No Known Allergies  Past Medical History Past Medical History:  Diagnosis Date   Arrhythmia    CAD (coronary artery disease)    stent x 2   Carotid artery occlusion    Diabetes mellitus    Dyslipidemia    Essential hypertension     Past Surgical History Past Surgical History:  Procedure Laterality Date   CARDIAC CATHETERIZATION     01/2010 ; 04/2010   CORONARY ARTERY BYPASS GRAFT N/A 08/23/2020   Procedure: CORONARY ARTERY BYPASS GRAFTING (CABG) x 3 ON CARDIOPULMONARY BYPASS USING EVH AND PARTIALLY OPEN HARVESTED LEFT GREATER SAPHENOUS VEIN AND LEFT INTERNAL MAMMARY ARTERY.  LIMA TO LAD, SVG TO OM, SVG TO PDA;  Surgeon: Corliss Skains, MD;  Location: [facility] OR;  Service: Open Heart Surgery;  Laterality: N/A;   RIGHT HEART CATH N/A 12/19/2021   Procedure: RIGHT HEART CATH;  Surgeon: [name], MD;  Location: [facility] INVASIVE CV LAB;  Service: Cardiovascular;  Laterality:  N/A;   RIGHT/LEFT HEART CATH AND CORONARY ANGIOGRAPHY N/A 08/22/2020   Procedure: RIGHT/LEFT HEART CATH AND CORONARY ANGIOGRAPHY;  Surgeon: [name], MD;  Location: [facility] INVASIVE CV LAB;  Service: Cardiovascular;  Laterality: N/A;   TEE WITHOUT CARDIOVERSION N/A 08/23/2020   Procedure: TRANSESOPHAGEAL ECHOCARDIOGRAM (TEE);  Surgeon: Corliss Skains, MD;  Location: [facility] OR;  Service: Open Heart Surgery;  Laterality: N/A;   TEMPORARY PACEMAKER N/A 08/22/2020   Procedure: TEMPORARY PACEMAKER;  Surgeon: [name], MD;  Location: [facility] INVASIVE CV LAB;  Service: Cardiovascular;  Laterality: N/A;   VEIN HARVEST Left 08/23/2020   Procedure: ENDOVEIN HARVEST AND PARTIALLY OPEN GREATER SAPHENOUS VEIN HARVESTING;  Surgeon: Corliss Skains, MD;  Location: [facility] OR;  Service: Open Heart Surgery;  Laterality: Left;    Family History family history is not on file.  Social History Social History   Socioeconomic History   Marital status: Married    Spouse name: Not on file   Number of children: Not on file   Years of education: Not on file   Highest education level: Not on file  Occupational History   Not on file  Tobacco Use   Smoking status: Former    Packs/day: 0.25    Years: 50.00    Additional pack years: 0.00    Total pack years: 12.50    Types: Cigarettes    Quit date: 08/2020    Years since quitting: 1.8   Smokeless tobacco: Never  Vaping Use   Vaping Use: Never used  Substance and Sexual Activity   Alcohol use: No   Drug use: No   Sexual activity: Not on file  Other Topics Concern   Not on file  Social History Narrative   Not on file   Social Determinants of Health   Financial Resource Strain: Not on file  Food Insecurity: No Food Insecurity (12/24/2021)   Hunger Vital Sign    Worried About Running Out of Food in the Last Year: Never true    Ran Out of Food in the Last Year: Never true  Transportation Needs: No Transportation Needs (12/24/2021)    PRAPARE - Administrator, Civil Service (Medical): No    Lack of Transportation (  Non-Medical): No  Physical Activity: Not on file  Stress: Not on file  Social Connections: Not on file  Intimate Partner Violence: Not At Risk (12/24/2021)   Humiliation, Afraid, Rape, and Kick questionnaire    Fear of Current or Ex-Partner: No    Emotionally Abused: No    Physically Abused: No    Sexually Abused: No    Lab Results  Component Value Date   HGBA1C 13.3 (H) 06/15/2022   Lab Results  Component Value Date   CHOL 305 (H) 11/08/2021   Lab Results  Component Value Date   HDL 62.70 11/08/2021   Lab Results  Component Value Date   LDLCALC 225 (H) 11/08/2021   Lab Results  Component Value Date   TRIG 84.0 11/08/2021   Lab Results  Component Value Date   CHOLHDL 5 11/08/2021   Lab Results  Component Value Date   CREATININE 2.28 (H) 06/15/2022   Lab Results  Component Value Date   GFR 28.04 (L) 06/15/2022   Lab Results  Component Value Date   MICROALBUR 373.3 (H) 06/29/2021      Component Value Date/Time   NA 139 06/15/2022 0901   NA 140 02/27/2022 1605   K 3.5 06/15/2022 0901   CL 105 06/15/2022 0901   CO2 25 06/15/2022 0901   GLUCOSE 289 (H) 06/15/2022 0901   BUN 27 (H) 06/15/2022 0901   BUN 32 (H) 02/27/2022 1605   CREATININE 2.28 (H) 06/15/2022 0901   CALCIUM 8.3 (L) 06/15/2022 0901   PROT 5.7 (L) 03/02/2022 1637   PROT 6.1 01/11/2022 1625   ALBUMIN 2.1 (L) 03/02/2022 1637   ALBUMIN 2.8 (L) 01/11/2022 1625   AST 22 03/02/2022 1637   ALT 13 03/02/2022 1637   ALKPHOS 60 03/02/2022 1637   BILITOT 0.3 03/02/2022 1637   BILITOT <0.2 01/11/2022 1625   GFRNONAA 30 (L) 03/02/2022 1637   GFRAA 75 12/17/2019 1101      Latest Ref Rng & Units 06/15/2022    9:01 AM 03/02/2022    4:37 PM 02/27/2022    4:05 PM  BMP  Glucose 70 - 99 mg/dL 811  914  782   BUN 6 - 23 mg/dL 27  28  32   Creatinine 0.40 - 1.50 mg/dL 9.56  2.13  0.86   BUN/Creat Ratio 10 - 24    13   Sodium 135 - 145 mEq/L 139  141  140   Potassium 3.5 - 5.1 mEq/L 3.5  3.7  5.2   Chloride 96 - 112 mEq/L 105  104  100   CO2 19 - 32 mEq/L 25  30  27    Calcium 8.4 - 10.5 mg/dL 8.3  8.2  8.4        Component Value Date/Time   WBC 6.7 03/02/2022 1637   RBC 3.86 (L) 03/02/2022 1637   HGB 12.0 (L) 03/02/2022 1637   HGB 13.8 11/11/2020 1136   HCT 36.4 (L) 03/02/2022 1637   HCT 41.1 11/11/2020 1136   PLT 251 03/02/2022 1637   PLT 213 11/11/2020 1136   MCV 94.3 03/02/2022 1637   MCV 89 11/11/2020 1136   MCH 31.1 03/02/2022 1637   MCHC 33.0 03/02/2022 1637   RDW 13.3 03/02/2022 1637   RDW 14.0 11/11/2020 1136   LYMPHSABS 1.5 03/02/2022 1637   LYMPHSABS 1.4 12/17/2019 1101   MONOABS 0.4 03/02/2022 1637   EOSABS 0.3 03/02/2022 1637   EOSABS 0.4 12/17/2019 1101   BASOSABS 0.1 03/02/2022 1637  BASOSABS 0.1 12/17/2019 1101     Parts of this note may have been dictated using voice recognition software. There may be variances in spelling and vocabulary which are unintentional. Not all errors are proofread. Please notify the Thereasa Parkin if any discrepancies are noted or if the meaning of any statement is not clear.

## 2022-06-29 NOTE — Telephone Encounter (Signed)
Patient does not have any BP nor weight readings to share for the month of May. He has a history of noncompliance in the RPM program. We have attempted to reach the patient five times this month to discuss, but we had to leave a voicemail each time.   There is no clear data from previous months whether the patient's vitals are controlled. Please discuss possible discontinuation in the program.

## 2022-07-03 ENCOUNTER — Encounter: Payer: Self-pay | Admitting: "Endocrinology

## 2022-07-06 ENCOUNTER — Other Ambulatory Visit: Payer: Self-pay

## 2022-07-10 ENCOUNTER — Other Ambulatory Visit: Payer: Self-pay

## 2022-07-30 ENCOUNTER — Telehealth: Payer: Self-pay | Admitting: Pharmacy Technician

## 2022-07-30 ENCOUNTER — Telehealth: Payer: Self-pay | Admitting: Cardiology

## 2022-07-30 NOTE — Telephone Encounter (Signed)
Auth Submission: DENIED Site of care: Site of care: [facility] Payer: [employer] Medication & CPT/J Code(s) submitted: Leqvio (Inclisiran) [phone] Route of submission (phone, fax, portal):  Fax # Auth type: Buy/Bill Units/visits requested:  Reference number:  Approval from:  to     Authorization has been DENIED because patient has not failed/treid preferred medications 1.repatha 2. Praluent  Dr. Rosemary Holms office has been notified Left v/m with [facility]. Phone: [phone]  [name] , Please d/c treatment plan.

## 2022-08-01 ENCOUNTER — Telehealth: Payer: Self-pay

## 2022-08-01 ENCOUNTER — Encounter: Payer: Self-pay | Admitting: "Endocrinology

## 2022-08-01 ENCOUNTER — Ambulatory Visit (INDEPENDENT_AMBULATORY_CARE_PROVIDER_SITE_OTHER): Payer: 59 | Admitting: "Endocrinology

## 2022-08-01 ENCOUNTER — Other Ambulatory Visit: Payer: Self-pay

## 2022-08-01 VITALS — BP 140/90 | HR 79 | Ht 63.0 in | Wt 145.0 lb

## 2022-08-01 DIAGNOSIS — E1165 Type 2 diabetes mellitus with hyperglycemia: Secondary | ICD-10-CM | POA: Diagnosis not present

## 2022-08-01 DIAGNOSIS — E7800 Pure hypercholesterolemia, unspecified: Secondary | ICD-10-CM

## 2022-08-01 DIAGNOSIS — Z794 Long term (current) use of insulin: Secondary | ICD-10-CM | POA: Diagnosis not present

## 2022-08-01 MED ORDER — EMPAGLIFLOZIN 25 MG PO TABS
25.0000 mg | ORAL_TABLET | Freq: Every day | ORAL | 1 refills | Status: DC
  Filled 2022-08-01: qty 90, 90d supply, fill #0

## 2022-08-01 MED ORDER — FREESTYLE LIBRE 3 SENSOR MISC
3 refills | Status: AC
  Filled 2022-08-01: qty 6, 84d supply, fill #0

## 2022-08-01 MED ORDER — HUMULIN 70/30 KWIKPEN (70-30) 100 UNIT/ML ~~LOC~~ SUPN
40.0000 [IU] | PEN_INJECTOR | Freq: Three times a day (TID) | SUBCUTANEOUS | 3 refills | Status: DC
  Filled 2022-08-01: qty 30, 25d supply, fill #0
  Filled 2022-11-12: qty 30, 25d supply, fill #1

## 2022-08-01 NOTE — Telephone Encounter (Signed)
Colin [name], CMA  ?

## 2022-08-01 NOTE — Progress Notes (Signed)
Outpatient Endocrinology Note Colin Corning, MD  08/01/22   Kase Shughart [facility] [birth year] [id number]  Referring Provider: Grayce Sessions, NP Primary Care Provider: Grayce Sessions, NP Reason for consultation: Subjective   Assessment & Plan  Javoni was seen today for diabetes.  Diagnoses and all orders for this visit:  Uncontrolled type 2 diabetes mellitus with hyperglycemia, with long-term current use of insulin (HCC) -     insulin isophane & regular human KwikPen (HUMULIN 70/30 KWIKPEN) (70-30) 100 UNIT/ML KwikPen; Inject 40 Units into the skin 3 (three) times daily.  Pure hypercholesterolemia    Diabetes complicated by hyperglycemia, nephropathy, NSTEMI Hba1c goal less than 7.0, current Hba1c is 13.3 . Will recommend for the following change of medications to: Humulin 70/30, 40 units before break fast and 40 units before dinner: 15 MIN BEFORE MEALS  Resume jardiance 25 mg qd  Pt sent with live translator to downstairs pharmacy to pick up prescription of Colin [name], Jardiance and rosuvastatin if covered under insurance   Patient was repeatedly instructed to bring all of his medications to the office visits and check blood sugar 3 times a day and bring his log, pt is not interested in doing it   No known contraindications to any of above medications  Hyperlipidemia -Last LDL off goal: 214 -Not taking rosuvastatin 5 mg daily -Started rosuvastatin 10 mg daily -Follow low fat diet and exercise    -Blood pressure goal <140/90 -Protein creatinine ratio elevated at 272 in 06/2021 -Not on ACE/ARB -Defer to nephrology -diet changes including salt restriction -limit eating outside -counseled BP targets per standards of diabetes care -Uncontrolled blood pressure can lead to retinopathy, nephropathy and cardiovascular and atherosclerotic heart disease  Reviewed and counseled on: -A1C target -Blood sugar targets -Complications of uncontrolled diabetes  -Checking blood  sugar before meals and bedtime and bring log next visit -All medications with mechanism of action and side effects -Hypoglycemia management: rule of 15's, Glucagon Emergency Kit and medical alert ID -low-carb low-fat plate-method diet -At least 20 minutes of physical activity per day -Annual dilated retinal eye exam and foot exam -compliance and follow up needs -follow up as scheduled or earlier if problem gets worse  Call if blood sugar is less than 70 or consistently above 250    Take a 15 gm snack of carbohydrate at bedtime before you go to sleep if your blood sugar is less than 100.    If you are going to fast after midnight for a test or procedure, ask your physician for instructions on how to reduce/decrease your insulin dose.    Call if blood sugar is less than 70 or consistently above 250  -Treating a low sugar by rule of 15  (15 gms of sugar every 15 min until sugar is more than 70) If you feel your sugar is low, test your sugar to be sure If your sugar is low (less than 70), then take 15 grams of a fast acting Carbohydrate (3-4 glucose tablets or glucose gel or 4 ounces of juice or regular soda) Recheck your sugar 15 min after treating low to make sure it is more than 70 If sugar is still less than 70, treat again with 15 grams of carbohydrate          Don't drive the hour of hypoglycemia  If unconscious/unable to eat or drink by mouth, use glucagon injection or nasal spray baqsimi and call 911. Can repeat again in 15 min if still unconscious.  Return in about 8 weeks (around 09/26/2022).   I have reviewed current medications, nurse's notes, allergies, vital signs, past medical and surgical history, family medical history, and social history for this encounter. Counseled patient on symptoms, examination findings, lab findings, imaging results, treatment decisions and monitoring and prognosis. The patient understood the recommendations and agrees with the treatment plan. All  questions regarding treatment plan were fully answered.  Colin Afton, MD  08/01/22    History of Present Illness Colin [name] is a 72 y.o. year old male who presents for f/u of Type 2 diabetes mellitus.  Colin [name] was first diagnosed in 2006.    Patient is able to communicate in english  Did not make any dose changes in insulin Not checking BG  Home diabetes regimen: Insulin regimen:   Humulin 70/30, 30 units bid takes 15 min before meals  Not taking Jardiance 25 mg daily - ran out- no issues while on it  Previous history:  He was started on insulin in 2021 and by history has been taking NPH, Lantus and NovoLog at various times  COMPLICATIONS +  NSTEMI +  nephropathy (GFR 28)  BLOOD SUGAR DATA No CGM/meter BG  Physical Exam  BP (!) 140/90   Pulse 79   Ht 5\' 3"  (1.6 m)   Wt 145 lb (65.8 kg)   SpO2 98%   BMI 25.69 kg/m    Constitutional: well developed, well nourished Head: normocephalic, atraumatic Eyes: sclera anicteric, no redness Neck: supple Lungs: normal respiratory effort Neurology: alert and oriented Skin: dry, no appreciable rashes Musculoskeletal: no appreciable defects Psychiatric: normal mood and affect Diabetic Foot Exam - Simple   No data filed      Current Medications Patient's Medications  New Prescriptions   No medications on file  Previous Medications   AMLODIPINE (NORVASC) 5 MG TABLET    Take 1 tablet (5 mg total) by mouth daily.   CLOPIDOGREL (PLAVIX) 75 MG TABLET    Take 1 tablet (75 mg total) by mouth daily.   CONTINUOUS GLUCOSE RECEIVER (FREESTYLE LIBRE 3 READER) DEVI    Use as directed!   CONTINUOUS GLUCOSE SENSOR (FREESTYLE LIBRE 3 SENSOR) MISC    Use as instructed change every 14 days   EMPAGLIFLOZIN (JARDIANCE) 25 MG TABS TABLET    Take 1 tablet (25 mg total) by mouth daily.   FUROSEMIDE (LASIX) 20 MG TABLET    Take 60 mg by mouth 2 (two) times daily.   FUROSEMIDE (LASIX) 20 MG TABLET    TAKE 3 TABLETS BY MOUTH TWICE A  DAY   INSULIN PEN NEEDLE (PEN NEEDLES 3/16") 31G X 5 MM MISC    Uses three times daily with insulin.   ISOSORBIDE MONONITRATE (IMDUR) 30 MG 24 HR TABLET    Take 1 tablet (30 mg total) by mouth daily.   IVABRADINE (CORLANOR) 5 MG TABS TABLET    Take 1 tablet (5 mg total) by mouth 2 (two) times daily with a meal.   METOPROLOL SUCCINATE (TOPROL-XL) 50 MG 24 HR TABLET    Take 1 tablet (50 mg total) by mouth daily. Take with or immediately following a meal.   NITROGLYCERIN (NITROSTAT) 0.4 MG SL TABLET    Place 1 tab (0.4mg  total) under the tongue every 5 minutes as needed for chest pain   PANTOPRAZOLE (PROTONIX) 20 MG TABLET    Take 1 tablet (20 mg total) by mouth daily.   ROSUVASTATIN (CRESTOR) 10 MG TABLET    Take 1  tablet (10 mg total) by mouth daily.  Modified Medications   Modified Medication Previous Medication   INSULIN ISOPHANE & REGULAR HUMAN KWIKPEN (HUMULIN 70/30 KWIKPEN) (70-30) 100 UNIT/ML KWIKPEN insulin isophane & regular human KwikPen (HUMULIN 70/30 KWIKPEN) (70-30) 100 UNIT/ML KwikPen      Inject 40 Units into the skin 3 (three) times daily.    Inject 30 units into the skin 3 times daily  Discontinued Medications   No medications on file    Allergies No Known Allergies  Past Medical History Past Medical History:  Diagnosis Date   Arrhythmia    CAD (coronary artery disease)    stent x 2   Carotid artery occlusion    Diabetes mellitus    Dyslipidemia    Essential hypertension     Past Surgical History Past Surgical History:  Procedure Laterality Date   CARDIAC CATHETERIZATION     01/2010 ; 04/2010   CORONARY ARTERY BYPASS GRAFT N/A 08/23/2020   Procedure: CORONARY ARTERY BYPASS GRAFTING (CABG) x 3 ON CARDIOPULMONARY BYPASS USING EVH AND PARTIALLY OPEN HARVESTED LEFT GREATER SAPHENOUS VEIN AND LEFT INTERNAL MAMMARY ARTERY.  LIMA TO LAD, SVG TO OM, SVG TO PDA;  Surgeon: Corliss Skains, MD;  Location: [facility] OR;  Service: Open Heart Surgery;  Laterality: N/A;   RIGHT  HEART CATH N/A 12/19/2021   Procedure: RIGHT HEART CATH;  Surgeon: [name], MD;  Location: [facility] INVASIVE CV LAB;  Service: Cardiovascular;  Laterality: N/A;   RIGHT/LEFT HEART CATH AND CORONARY ANGIOGRAPHY N/A 08/22/2020   Procedure: RIGHT/LEFT HEART CATH AND CORONARY ANGIOGRAPHY;  Surgeon: [name], MD;  Location: [facility] INVASIVE CV LAB;  Service: Cardiovascular;  Laterality: N/A;   TEE WITHOUT CARDIOVERSION N/A 08/23/2020   Procedure: TRANSESOPHAGEAL ECHOCARDIOGRAM (TEE);  Surgeon: Corliss Skains, MD;  Location: [facility] OR;  Service: Open Heart Surgery;  Laterality: N/A;   TEMPORARY PACEMAKER N/A 08/22/2020   Procedure: TEMPORARY PACEMAKER;  Surgeon: [name], MD;  Location: [facility] INVASIVE CV LAB;  Service: Cardiovascular;  Laterality: N/A;   VEIN HARVEST Left 08/23/2020   Procedure: ENDOVEIN HARVEST AND PARTIALLY OPEN GREATER SAPHENOUS VEIN HARVESTING;  Surgeon: Corliss Skains, MD;  Location: [facility] OR;  Service: Open Heart Surgery;  Laterality: Left;    Family History family history is not on file.  Social History Social History   Socioeconomic History   Marital status: Married    Spouse name: Not on file   Number of children: Not on file   Years of education: Not on file   Highest education level: Not on file  Occupational History   Not on file  Tobacco Use   Smoking status: Former    Packs/day: 0.25    Years: 50.00    Additional pack years: 0.00    Total pack years: 12.50    Types: Cigarettes    Quit date: 08/2020    Years since quitting: 1.9   Smokeless tobacco: Never  Vaping Use   Vaping Use: Never used  Substance and Sexual Activity   Alcohol use: No   Drug use: No   Sexual activity: Not on file  Other Topics Concern   Not on file  Social History Narrative   Not on file   Social Determinants of Health   Financial Resource Strain: Not on file  Food Insecurity: No Food Insecurity (12/24/2021)   Hunger Vital Sign    Worried About  Running Out of Food in the Last Year: Never true  Ran Out of Food in the Last Year: Never true  Transportation Needs: No Transportation Needs (12/24/2021)   PRAPARE - Administrator, Civil Service (Medical): No    Lack of Transportation (Non-Medical): No  Physical Activity: Not on file  Stress: Not on file  Social Connections: Not on file  Intimate Partner Violence: Not At Risk (12/24/2021)   Humiliation, Afraid, Rape, and Kick questionnaire    Fear of Current or Ex-Partner: No    Emotionally Abused: No    Physically Abused: No    Sexually Abused: No    Lab Results  Component Value Date   HGBA1C 13.3 (H) 06/15/2022   Lab Results  Component Value Date   CHOL 305 (H) 11/08/2021   Lab Results  Component Value Date   HDL 62.70 11/08/2021   Lab Results  Component Value Date   LDLCALC 225 (H) 11/08/2021   Lab Results  Component Value Date   TRIG 84.0 11/08/2021   Lab Results  Component Value Date   CHOLHDL 5 11/08/2021   Lab Results  Component Value Date   CREATININE 2.28 (H) 06/15/2022   Lab Results  Component Value Date   GFR 28.04 (L) 06/15/2022   Lab Results  Component Value Date   MICROALBUR 373.3 (H) 06/29/2021      Component Value Date/Time   NA 139 06/15/2022 0901   NA 140 02/27/2022 1605   K 3.5 06/15/2022 0901   CL 105 06/15/2022 0901   CO2 25 06/15/2022 0901   GLUCOSE 289 (H) 06/15/2022 0901   BUN 27 (H) 06/15/2022 0901   BUN 32 (H) 02/27/2022 1605   CREATININE 2.28 (H) 06/15/2022 0901   CALCIUM 8.3 (L) 06/15/2022 0901   PROT 5.7 (L) 03/02/2022 1637   PROT 6.1 01/11/2022 1625   ALBUMIN 2.1 (L) 03/02/2022 1637   ALBUMIN 2.8 (L) 01/11/2022 1625   AST 22 03/02/2022 1637   ALT 13 03/02/2022 1637   ALKPHOS 60 03/02/2022 1637   BILITOT 0.3 03/02/2022 1637   BILITOT <0.2 01/11/2022 1625   GFRNONAA 30 (L) 03/02/2022 1637   GFRAA 75 12/17/2019 1101      Latest Ref Rng & Units 06/15/2022    9:01 AM 03/02/2022    4:37 PM 02/27/2022     4:05 PM  BMP  Glucose 70 - 99 mg/dL 865  784  696   BUN 6 - 23 mg/dL 27  28  32   Creatinine 0.40 - 1.50 mg/dL 2.95  2.84  1.32   BUN/Creat Ratio 10 - 24   13   Sodium 135 - 145 mEq/L 139  141  140   Potassium 3.5 - 5.1 mEq/L 3.5  3.7  5.2   Chloride 96 - 112 mEq/L 105  104  100   CO2 19 - 32 mEq/L 25  30  27    Calcium 8.4 - 10.5 mg/dL 8.3  8.2  8.4        Component Value Date/Time   WBC 6.7 03/02/2022 1637   RBC 3.86 (L) 03/02/2022 1637   HGB 12.0 (L) 03/02/2022 1637   HGB 13.8 11/11/2020 1136   HCT 36.4 (L) 03/02/2022 1637   HCT 41.1 11/11/2020 1136   PLT 251 03/02/2022 1637   PLT 213 11/11/2020 1136   MCV 94.3 03/02/2022 1637   MCV 89 11/11/2020 1136   MCH 31.1 03/02/2022 1637   MCHC 33.0 03/02/2022 1637   RDW 13.3 03/02/2022 1637   RDW 14.0 11/11/2020 1136  LYMPHSABS 1.5 03/02/2022 1637   LYMPHSABS 1.4 12/17/2019 1101   MONOABS 0.4 03/02/2022 1637   EOSABS 0.3 03/02/2022 1637   EOSABS 0.4 12/17/2019 1101   BASOSABS 0.1 03/02/2022 1637   BASOSABS 0.1 12/17/2019 1101     Parts of this note may have been dictated using voice recognition software. There may be variances in spelling and vocabulary which are unintentional. Not all errors are proofread. Please notify the Thereasa Parkin if any discrepancies are noted or if the meaning of any statement is not clear.

## 2022-08-01 NOTE — Patient Instructions (Addendum)
Humulin 70/30, 40 units before break fast and 40 units before dinner: 15 MIN BEFORE MEALS  Resume jardiance 25 mg every day  __________    Goals of DM therapy:  Morning Fasting blood sugar: 80-140  Blood sugar before meals: 80-140 Bed time blood sugar: 100-150  A1C <7%, limited only by hypoglycemia  1.Diabetes medications and their side effects discussed, including hypoglycemia    2. Check blood glucose:  a) Always check blood sugars before driving. Please see below (under hypoglycemia) on how to manage b) Check a minimum of 3 times/day or more as needed when having symptoms of hypoglycemia.   c) Try to check blood glucose before sleeping/in the middle of the night to ensure that it is remaining stable and not dropping less than 100 d) Check blood glucose more often if sick  3. Diet: a) 3 meals per day schedule b: Restrict carbs to 60-70 grams (4 servings) per meal c) Colorful vegetables - 3 servings a day, and low sugar fruit 2 servings/day Plate control method: 1/4 plate protein, 1/4 starch, 1/2 green, yellow, or red vegetables d) Avoid carbohydrate snacks unless hypoglycemic episode, or increased physical activity  4. Regular exercise as tolerated, preferably 3 or more hours a week  5. Hypoglycemia: a)  Do not drive or operate machinery without first testing blood glucose to assure it is over 90 mg%, or if dizzy, lightheaded, not feeling normal, etc, or  if foot or leg is numb or weak. b)  If blood glucose less than 70, take four 5gm Glucose tabs or 15-30 gm Glucose gel.  Repeat every 15 min as needed until blood sugar is >100 mg/dl. If hypoglycemia persists then call 911.   6. Sick day management: a) Check blood glucose more often b) Continue usual therapy if blood sugars are elevated.   7. Contact the doctor immediately if blood glucose is frequently <60 mg/dl, or an episode of severe hypoglycemia occurs (where someone had to give you glucose/  glucagon or if you passed  out from a low blood glucose), or if blood glucose is persistently >350 mg/dl, for further management  8. A change in level of physical activity or exercise and a change in diet may also affect your blood sugar. Check blood sugars more often and call if needed.  Instructions: 1. Bring glucose meter, blood glucose records on every visit for review 2. Continue to follow up with primary care physician and other providers for medical care 3. Yearly eye  and foot exam 4. Please get blood work done prior to the next appointment

## 2022-08-03 ENCOUNTER — Encounter: Payer: 59 | Admitting: Cardiology

## 2022-08-03 NOTE — Progress Notes (Deleted)
Follow up visit  Subjective:   Colin [name], male    DOB: October 09, 1950, 72 y.o.   MRN: [id number]   HPI  Chief Complaint  Patient presents with   Coronary Artery Disease   Follow-up    72 year old [name] speaking patient with uncontrolled type 2 diabetes mellitus, mixed hyperlipidemia, tobacco dependence, coronary artery disease with prior MI, LCX and RCA stents in 2012, s/p CABG X 3.  LIMA LAD, RSVG PDA, OM1  (08/23/2020), HFrEF Patient does not have any BP nor weight readings to share for the month of May. He has a history of noncompliance in the RPM program. We have attempted to reach the patient five times this month to discuss, but we had to leave a voicemail each time.    ***     Current Outpatient Medications:    amLODipine (NORVASC) 5 MG tablet, Take 1 tablet (5 mg total) by mouth daily., Disp: 90 tablet, Rfl: 3   clopidogrel (PLAVIX) 75 MG tablet, Take 1 tablet (75 mg total) by mouth daily., Disp: 90 tablet, Rfl: 3   Continuous Glucose Receiver (FREESTYLE LIBRE 3 READER) DEVI, Use as directed!, Disp: 1 each, Rfl: 0   Continuous Glucose Sensor (FREESTYLE LIBRE 3 SENSOR) MISC, Use as instructed change every 14 days, Disp: 6 each, Rfl: 3   empagliflozin (JARDIANCE) 25 MG TABS tablet, Take 1 tablet (25 mg total) by mouth daily., Disp: 90 tablet, Rfl: 1   furosemide (LASIX) 20 MG tablet, Take 60 mg by mouth 2 (two) times daily., Disp: , Rfl:    furosemide (LASIX) 20 MG tablet, TAKE 3 TABLETS BY MOUTH TWICE A DAY, Disp: 180 tablet, Rfl: 2   insulin isophane & regular human KwikPen (HUMULIN 70/30 KWIKPEN) (70-30) 100 UNIT/ML KwikPen, Inject 40 Units into the skin 3 (three) times daily., Disp: 30 mL, Rfl: 3   Insulin Pen Needle (PEN NEEDLES 3/16") 31G X 5 MM MISC, Uses three times daily with insulin., Disp: 100 each, Rfl: 4   isosorbide mononitrate (IMDUR) 30 MG 24 hr tablet, Take 1 tablet (30 mg total) by mouth daily., Disp: 90 tablet, Rfl: 3   ivabradine (CORLANOR) 5 MG TABS  tablet, Take 1 tablet (5 mg total) by mouth 2 (two) times daily with a meal. (Patient not taking: Reported on 08/01/2022), Disp: 60 tablet, Rfl: 0   metoprolol succinate (TOPROL-XL) 50 MG 24 hr tablet, Take 1 tablet (50 mg total) by mouth daily. Take with or immediately following a meal., Disp: 90 tablet, Rfl: 3   nitroGLYCERIN (NITROSTAT) 0.4 MG SL tablet, Place 1 tab (0.4mg  total) under the tongue every 5 minutes as needed for chest pain, Disp: 25 tablet, Rfl: 3   pantoprazole (PROTONIX) 20 MG tablet, Take 1 tablet (20 mg total) by mouth daily., Disp: 90 tablet, Rfl: 3   rosuvastatin (CRESTOR) 10 MG tablet, Take 1 tablet (10 mg total) by mouth daily., Disp: 90 tablet, Rfl: 0   Cardiovascular & other pertient studies:  Reviewed external labs and tests, independently interpreted  EKG 02/06/2022: Sinus at 70 bpm. Old anteroseptal infarct LVH  Recent labs: 06/15/2022: Glucose 289, BUN/Cr 27/2.28. EGFR 28. Na/K 139/3.5.  Direct LDL 214  02/27/2022: Glucose 399, BUN/Cr 32/2.49. EGFR 27. Na/K 140/5.2.  BNP 286  01/23/2022: Glucose 118, BUN/Cr 28/2.14. EGFR 32. Na/K 141/3.8.  H/H 10/31. MCV 94. Platelets 184 TSH 5.0 normal  11/2021: Chol 305, TG 84, HDL 62, LDL 225 Lipoprotein (a) 373 HbA1C 11.4%   Review of Systems  Cardiovascular:  Negative for chest pain, dyspnea on exertion, leg swelling, palpitations and syncope.  Musculoskeletal:  Negative for joint pain.         There were no vitals filed for this visit.   There is no height or weight on file to calculate BMI. There were no vitals filed for this visit.    Objective:   Physical Exam Vitals and nursing note reviewed.  Constitutional:      General: He is not in acute distress. Neck:     Vascular: No JVD.  Cardiovascular:     Rate and Rhythm: Normal rate and regular rhythm.     Heart sounds: Normal heart sounds. No murmur heard. Pulmonary:     Effort: Pulmonary effort is normal.     Breath sounds: Normal breath  sounds. No wheezing or rales.  Musculoskeletal:     Right lower leg: No edema.     Left lower leg: No edema.             Visit diagnoses: No diagnosis found.      Assessment & Recommendations:   72 y.o. [name] speaking patient with uncontrolled type 2 diabetes mellitus, mixed hyperlipidemia, tobacco dependence, coronary artery disease with prior MI, LCX and RCA stents in 2012, s/p CABG X 3.  LIMA LAD, RSVG PDA, OM1  (08/23/2020), HFrEF  *** Left abdominal pain: Likely musculoskeletal.  Okay to take 1-2 Tylenol pills for now. Recommend f/u w/PCP.   *** HFrEF: Clinically appears euvolemic. Patient does not have his medications or his medicatio  list with him today but says that he will be running out of medications in a week or so.  Will check labs and refill medications based on the results.   CAD: No recurrent angina symptoms at this time.   Continue aspirin, statin and current anti anginal therapy.   F/u in 4 weeks        [name], MD Pager: [phone] Office: [phone]

## 2022-08-08 ENCOUNTER — Other Ambulatory Visit: Payer: Self-pay

## 2022-08-15 NOTE — Telephone Encounter (Signed)
done

## 2022-09-06 ENCOUNTER — Other Ambulatory Visit: Payer: Self-pay

## 2022-09-11 ENCOUNTER — Encounter (INDEPENDENT_AMBULATORY_CARE_PROVIDER_SITE_OTHER): Payer: Self-pay

## 2022-10-11 NOTE — Progress Notes (Signed)
This encounter was created in error - please disregard.

## 2022-11-12 ENCOUNTER — Other Ambulatory Visit: Payer: Self-pay

## 2022-11-23 ENCOUNTER — Inpatient Hospital Stay (HOSPITAL_COMMUNITY)
Admission: EM | Admit: 2022-11-23 | Discharge: 2022-11-28 | DRG: 291 | Disposition: A | Discharge status: Home / Self Care | Payer: 59 | Attending: Internal Medicine | Admitting: Internal Medicine

## 2022-11-23 ENCOUNTER — Other Ambulatory Visit: Payer: Self-pay

## 2022-11-23 ENCOUNTER — Encounter (HOSPITAL_COMMUNITY): Payer: Self-pay | Admitting: Internal Medicine

## 2022-11-23 ENCOUNTER — Emergency Department (HOSPITAL_COMMUNITY): Payer: 59

## 2022-11-23 DIAGNOSIS — I252 Old myocardial infarction: Secondary | ICD-10-CM

## 2022-11-23 DIAGNOSIS — I169 Hypertensive crisis, unspecified: Secondary | ICD-10-CM | POA: Diagnosis present

## 2022-11-23 DIAGNOSIS — R1013 Epigastric pain: Secondary | ICD-10-CM

## 2022-11-23 DIAGNOSIS — I130 Hypertensive heart and chronic kidney disease with heart failure and stage 1 through stage 4 chronic kidney disease, or unspecified chronic kidney disease: Principal | ICD-10-CM | POA: Diagnosis present

## 2022-11-23 DIAGNOSIS — Z794 Long term (current) use of insulin: Secondary | ICD-10-CM

## 2022-11-23 DIAGNOSIS — I5043 Acute on chronic combined systolic (congestive) and diastolic (congestive) heart failure: Secondary | ICD-10-CM | POA: Diagnosis present

## 2022-11-23 DIAGNOSIS — E1122 Type 2 diabetes mellitus with diabetic chronic kidney disease: Secondary | ICD-10-CM | POA: Diagnosis present

## 2022-11-23 DIAGNOSIS — I5021 Acute systolic (congestive) heart failure: Secondary | ICD-10-CM | POA: Diagnosis not present

## 2022-11-23 DIAGNOSIS — I340 Nonrheumatic mitral (valve) insufficiency: Secondary | ICD-10-CM | POA: Diagnosis present

## 2022-11-23 DIAGNOSIS — N184 Chronic kidney disease, stage 4 (severe): Secondary | ICD-10-CM | POA: Diagnosis present

## 2022-11-23 DIAGNOSIS — Z951 Presence of aortocoronary bypass graft: Secondary | ICD-10-CM

## 2022-11-23 DIAGNOSIS — I2510 Atherosclerotic heart disease of native coronary artery without angina pectoris: Secondary | ICD-10-CM | POA: Diagnosis present

## 2022-11-23 DIAGNOSIS — K219 Gastro-esophageal reflux disease without esophagitis: Secondary | ICD-10-CM | POA: Diagnosis present

## 2022-11-23 DIAGNOSIS — Z603 Acculturation difficulty: Secondary | ICD-10-CM | POA: Diagnosis present

## 2022-11-23 DIAGNOSIS — Z79899 Other long term (current) drug therapy: Secondary | ICD-10-CM

## 2022-11-23 DIAGNOSIS — R079 Chest pain, unspecified: Secondary | ICD-10-CM | POA: Diagnosis present

## 2022-11-23 DIAGNOSIS — I255 Ischemic cardiomyopathy: Secondary | ICD-10-CM | POA: Diagnosis present

## 2022-11-23 DIAGNOSIS — E119 Type 2 diabetes mellitus without complications: Secondary | ICD-10-CM | POA: Diagnosis not present

## 2022-11-23 DIAGNOSIS — E785 Hyperlipidemia, unspecified: Secondary | ICD-10-CM | POA: Diagnosis present

## 2022-11-23 DIAGNOSIS — I10 Essential (primary) hypertension: Secondary | ICD-10-CM | POA: Diagnosis not present

## 2022-11-23 DIAGNOSIS — I509 Heart failure, unspecified: Principal | ICD-10-CM

## 2022-11-23 DIAGNOSIS — N179 Acute kidney failure, unspecified: Secondary | ICD-10-CM | POA: Diagnosis not present

## 2022-11-23 DIAGNOSIS — I2582 Chronic total occlusion of coronary artery: Secondary | ICD-10-CM | POA: Diagnosis present

## 2022-11-23 DIAGNOSIS — E876 Hypokalemia: Secondary | ICD-10-CM | POA: Diagnosis not present

## 2022-11-23 DIAGNOSIS — E782 Mixed hyperlipidemia: Secondary | ICD-10-CM

## 2022-11-23 DIAGNOSIS — E1169 Type 2 diabetes mellitus with other specified complication: Secondary | ICD-10-CM | POA: Diagnosis present

## 2022-11-23 DIAGNOSIS — R7989 Other specified abnormal findings of blood chemistry: Secondary | ICD-10-CM

## 2022-11-23 DIAGNOSIS — Z955 Presence of coronary angioplasty implant and graft: Secondary | ICD-10-CM | POA: Diagnosis not present

## 2022-11-23 DIAGNOSIS — Z7902 Long term (current) use of antithrombotics/antiplatelets: Secondary | ICD-10-CM

## 2022-11-23 DIAGNOSIS — Z91148 Patient's other noncompliance with medication regimen for other reason: Secondary | ICD-10-CM

## 2022-11-23 DIAGNOSIS — E11649 Type 2 diabetes mellitus with hypoglycemia without coma: Secondary | ICD-10-CM | POA: Diagnosis not present

## 2022-11-23 DIAGNOSIS — I5023 Acute on chronic systolic (congestive) heart failure: Secondary | ICD-10-CM | POA: Diagnosis present

## 2022-11-23 DIAGNOSIS — E1165 Type 2 diabetes mellitus with hyperglycemia: Secondary | ICD-10-CM | POA: Diagnosis present

## 2022-11-23 DIAGNOSIS — R911 Solitary pulmonary nodule: Secondary | ICD-10-CM | POA: Diagnosis present

## 2022-11-23 DIAGNOSIS — Z7984 Long term (current) use of oral hypoglycemic drugs: Secondary | ICD-10-CM

## 2022-11-23 DIAGNOSIS — R531 Weakness: Secondary | ICD-10-CM | POA: Diagnosis present

## 2022-11-23 DIAGNOSIS — Z87891 Personal history of nicotine dependence: Secondary | ICD-10-CM | POA: Diagnosis not present

## 2022-11-23 HISTORY — DX: Chronic combined systolic (congestive) and diastolic (congestive) heart failure: I50.42

## 2022-11-23 LAB — CBC
HCT: 40 % (ref 39.0–52.0)
Hemoglobin: 12.8 g/dL — ABNORMAL LOW (ref 13.0–17.0)
MCH: 30 pg (ref 26.0–34.0)
MCHC: 32 g/dL (ref 30.0–36.0)
MCV: 93.9 fL (ref 80.0–100.0)
Platelets: 239 10*3/uL (ref 150–400)
RBC: 4.26 MIL/uL (ref 4.22–5.81)
RDW: 13.4 % (ref 11.5–15.5)
WBC: 7.2 10*3/uL (ref 4.0–10.5)
nRBC: 0 % (ref 0.0–0.2)

## 2022-11-23 LAB — HEPATIC FUNCTION PANEL
ALT: 15 U/L (ref 0–44)
AST: 25 U/L (ref 15–41)
Albumin: 1.6 g/dL — ABNORMAL LOW (ref 3.5–5.0)
Alkaline Phosphatase: 55 U/L (ref 38–126)
Bilirubin, Direct: 0.2 mg/dL (ref 0.0–0.2)
Indirect Bilirubin: 0.3 mg/dL (ref 0.3–0.9)
Total Bilirubin: 0.5 mg/dL (ref 0.3–1.2)
Total Protein: 5.3 g/dL — ABNORMAL LOW (ref 6.5–8.1)

## 2022-11-23 LAB — BASIC METABOLIC PANEL
Anion gap: 8 (ref 5–15)
BUN: 24 mg/dL — ABNORMAL HIGH (ref 8–23)
CO2: 28 mmol/L (ref 22–32)
Calcium: 9 mg/dL (ref 8.9–10.3)
Chloride: 106 mmol/L (ref 98–111)
Creatinine, Ser: 2.4 mg/dL — ABNORMAL HIGH (ref 0.61–1.24)
GFR, Estimated: 28 mL/min — ABNORMAL LOW (ref >60)
Glucose, Bld: 243 mg/dL — ABNORMAL HIGH (ref 70–99)
Potassium: 3.8 mmol/L (ref 3.5–5.1)
Sodium: 142 mmol/L (ref 135–145)

## 2022-11-23 LAB — MAGNESIUM: Magnesium: 2 mg/dL (ref 1.7–2.4)

## 2022-11-23 LAB — BRAIN NATRIURETIC PEPTIDE: B Natriuretic Peptide: 1693.7 pg/mL — ABNORMAL HIGH (ref 0.0–100.0)

## 2022-11-23 LAB — TROPONIN I (HIGH SENSITIVITY)
Troponin I (High Sensitivity): 87 ng/L — ABNORMAL HIGH (ref <18)
Troponin I (High Sensitivity): 82 ng/L — ABNORMAL HIGH (ref <18)

## 2022-11-23 LAB — LIPASE, BLOOD: Lipase: 25 U/L (ref 11–51)

## 2022-11-23 MED ORDER — AMLODIPINE BESYLATE 5 MG PO TABS
5.0000 mg | ORAL_TABLET | Freq: Every day | ORAL | Status: DC
  Administered 2022-11-24: 5 mg via ORAL
  Filled 2022-11-23: qty 1

## 2022-11-23 MED ORDER — ACETAMINOPHEN 325 MG PO TABS
650.0000 mg | ORAL_TABLET | Freq: Four times a day (QID) | ORAL | Status: DC | PRN
  Administered 2022-11-24: 650 mg via ORAL
  Filled 2022-11-23: qty 2

## 2022-11-23 MED ORDER — FUROSEMIDE 10 MG/ML IJ SOLN
40.0000 mg | Freq: Two times a day (BID) | INTRAMUSCULAR | Status: DC
  Administered 2022-11-24: 40 mg via INTRAVENOUS
  Filled 2022-11-23: qty 4

## 2022-11-23 MED ORDER — PANTOPRAZOLE SODIUM 20 MG PO TBEC
20.0000 mg | DELAYED_RELEASE_TABLET | Freq: Every day | ORAL | Status: DC
  Administered 2022-11-24 – 2022-11-28 (×5): 20 mg via ORAL
  Filled 2022-11-23 (×5): qty 1

## 2022-11-23 MED ORDER — FUROSEMIDE 10 MG/ML IJ SOLN
60.0000 mg | Freq: Once | INTRAMUSCULAR | Status: AC
  Administered 2022-11-23: 60 mg via INTRAVENOUS
  Filled 2022-11-23: qty 6

## 2022-11-23 MED ORDER — MELATONIN 3 MG PO TABS
3.0000 mg | ORAL_TABLET | Freq: Every evening | ORAL | Status: DC | PRN
  Administered 2022-11-27: 3 mg via ORAL
  Filled 2022-11-23: qty 1

## 2022-11-23 MED ORDER — NALOXONE HCL 0.4 MG/ML IJ SOLN: 0.4000 mg | INTRAMUSCULAR | Status: DC | PRN

## 2022-11-23 MED ORDER — FENTANYL CITRATE PF 50 MCG/ML IJ SOSY: 25.0000 ug | PREFILLED_SYRINGE | INTRAMUSCULAR | Status: DC | PRN

## 2022-11-23 MED ORDER — ROSUVASTATIN CALCIUM 5 MG PO TABS
10.0000 mg | ORAL_TABLET | Freq: Every day | ORAL | Status: DC
  Administered 2022-11-24 – 2022-11-26 (×3): 10 mg via ORAL
  Filled 2022-11-23 (×3): qty 2

## 2022-11-23 MED ORDER — ONDANSETRON HCL 4 MG/2ML IJ SOLN: 4.0000 mg | Freq: Four times a day (QID) | INTRAMUSCULAR | Status: DC | PRN

## 2022-11-23 MED ORDER — INSULIN ASPART 100 UNIT/ML IJ SOLN
0.0000 [IU] | Freq: Three times a day (TID) | INTRAMUSCULAR | Status: DC
  Administered 2022-11-24: 5 [IU] via SUBCUTANEOUS
  Administered 2022-11-24: 8 [IU] via SUBCUTANEOUS
  Administered 2022-11-25: 5 [IU] via SUBCUTANEOUS
  Administered 2022-11-25: 2 [IU] via SUBCUTANEOUS
  Administered 2022-11-25: 5 [IU] via SUBCUTANEOUS
  Administered 2022-11-26: 8 [IU] via SUBCUTANEOUS
  Administered 2022-11-26: 5 [IU] via SUBCUTANEOUS
  Administered 2022-11-26 – 2022-11-27 (×2): 2 [IU] via SUBCUTANEOUS
  Administered 2022-11-27 (×2): 5 [IU] via SUBCUTANEOUS
  Administered 2022-11-28: 8 [IU] via SUBCUTANEOUS

## 2022-11-23 MED ORDER — ACETAMINOPHEN 650 MG RE SUPP: 650.0000 mg | Freq: Four times a day (QID) | RECTAL | Status: DC | PRN

## 2022-11-23 MED ORDER — NITROGLYCERIN IN D5W 200-5 MCG/ML-% IV SOLN
0.0000 ug/min | INTRAVENOUS | Status: DC
  Administered 2022-11-23: 10 ug/min via INTRAVENOUS
  Administered 2022-11-24: 110 ug/min via INTRAVENOUS
  Filled 2022-11-23 (×2): qty 250

## 2022-11-23 MED ORDER — INSULIN GLARGINE-YFGN 100 UNIT/ML ~~LOC~~ SOLN
15.0000 [IU] | Freq: Two times a day (BID) | SUBCUTANEOUS | Status: DC
  Administered 2022-11-24 (×2): 15 [IU] via SUBCUTANEOUS
  Filled 2022-11-23 (×3): qty 0.15

## 2022-11-23 NOTE — ED Notes (Signed)
[name] ([place]) [phone] CALL WITH ANY UPDATES OR TRANSPORTATION

## 2022-11-23 NOTE — ED Notes (Signed)
Bedside Urinal provided.

## 2022-11-23 NOTE — ED Triage Notes (Addendum)
Patient reports epigastric/low chest pain for 3 days with mild SOB , denies emesis or diaphoresis . History of CABG/CAD . Hypertensive at triage .

## 2022-11-23 NOTE — ED Provider Notes (Signed)
[facility] Provider Note   CSN: [id number] Arrival date & time: 11/23/22  1920     History  Chief Complaint  Patient presents with   Abdominal Pain   Chest Pain    Colin [name] is a 72 y.o. male.   Abdominal Pain Associated symptoms: chest pain   Chest Pain Associated symptoms: abdominal pain   Patient presents abdominal pain and chest pain.  Family member translates since he does speak a rare language.  Hypertensive.  History of noncompliance.  States he has been eating less due to pain in his abdomen with it.  States abdomen feels more swollen.  Has pitting edema bilateral lower extremities.    Past Medical History:  Diagnosis Date   Arrhythmia    CAD (coronary artery disease)    stent x 2   Carotid artery occlusion    Diabetes mellitus    Dyslipidemia    Essential hypertension     Home Medications Prior to Admission medications   Medication Sig Start Date End Date Taking? Authorizing Provider  amLODipine (NORVASC) 5 MG tablet Take 1 tablet (5 mg total) by mouth daily. 02/14/22 02/09/23  Patwardhan, Anabel Bene, MD  clopidogrel (PLAVIX) 75 MG tablet Take 1 tablet (75 mg total) by mouth daily. 02/07/22   Custovic, Rozell Searing, DO  Continuous Glucose Receiver (FREESTYLE LIBRE 3 READER) DEVI Use as directed! 02/06/22   Reather Littler, MD  Continuous Glucose Sensor (FREESTYLE LIBRE 3 SENSOR) MISC Use as instructed change every 14 days 08/01/22   Altamese Guernsey, MD  empagliflozin (JARDIANCE) 25 MG TABS tablet Take 1 tablet (25 mg total) by mouth daily. 08/01/22   Altamese [place], MD  furosemide (LASIX) 20 MG tablet Take 60 mg by mouth 2 (two) times daily.    [provider]  furosemide (LASIX) 20 MG tablet TAKE 3 TABLETS BY MOUTH TWICE A DAY 02/14/22   Patwardhan, Manish J, MD  insulin isophane & regular human KwikPen (HUMULIN 70/30 KWIKPEN) (70-30) 100 UNIT/ML KwikPen Inject 40 Units into the skin 3 (three) times daily. 08/01/22   Motwani,  Carin Hock, MD  Insulin Pen Needle (PEN NEEDLES 3/16") 31G X 5 MM MISC Uses three times daily with insulin. 05/24/22   Shamleffer, Konrad Dolores, MD  isosorbide mononitrate (IMDUR) 30 MG 24 hr tablet Take 1 tablet (30 mg total) by mouth daily. 02/14/22 02/09/23  Patwardhan, Anabel Bene, MD  ivabradine (CORLANOR) 5 MG TABS tablet Take 1 tablet (5 mg total) by mouth 2 (two) times daily with a meal. Patient not taking: Reported on 08/01/2022 01/12/22   Hoy Register, MD  metoprolol succinate (TOPROL-XL) 50 MG 24 hr tablet Take 1 tablet (50 mg total) by mouth daily. Take with or immediately following a meal. 02/14/22 02/09/23  Patwardhan, Anabel Bene, MD  nitroGLYCERIN (NITROSTAT) 0.4 MG SL tablet Place 1 tab (0.4mg  total) under the tongue every 5 minutes as needed for chest pain 02/14/22   Patwardhan, Manish J, MD  pantoprazole (PROTONIX) 20 MG tablet Take 1 tablet (20 mg total) by mouth daily. 02/07/22   Custovic, Rozell Searing, DO  rosuvastatin (CRESTOR) 10 MG tablet Take 1 tablet (10 mg total) by mouth daily. 06/20/22   Altamese Manchester, MD      Allergies    Patient has no known allergies.    Review of Systems   Review of Systems  Cardiovascular:  Positive for chest pain.  Gastrointestinal:  Positive for abdominal pain.    Physical Exam Updated Vital Signs  BP (!) 211/103   Pulse 92   Temp (!) 97.3 F (36.3 C)   Resp (!) 23   SpO2 95%  Physical Exam Vitals reviewed.  Cardiovascular:     Rate and Rhythm: Normal rate and regular rhythm.  Pulmonary:     Comments: Harsh breath sounds on the lower right lung field. Abdominal:     Comments: Some distention.  No hernia palpated.  Skin:    Capillary Refill: Capillary refill takes less than 2 seconds.  Neurological:     Mental Status: He is alert.   Moderate pitting edema bilateral lower extremities.  ED Results / Procedures / Treatments   Labs (all labs ordered are listed, but only abnormal results are displayed) Labs Reviewed  BASIC METABOLIC PANEL -  Abnormal; Notable for the following components:      Result Value   Glucose, Bld 243 (*)    BUN 24 (*)    Creatinine, Ser 2.40 (*)    GFR, Estimated 28 (*)    All other components within normal limits  CBC - Abnormal; Notable for the following components:   Hemoglobin 12.8 (*)    All other components within normal limits  TROPONIN I (HIGH SENSITIVITY) - Abnormal; Notable for the following components:   Troponin I (High Sensitivity) 87 (*)    All other components within normal limits  LIPASE, BLOOD  HEPATIC FUNCTION PANEL  BRAIN NATRIURETIC PEPTIDE  TROPONIN I (HIGH SENSITIVITY)    EKG EKG Interpretation Date/Time:  Friday November 23 2022 19:39:55 EDT Ventricular Rate:  96 PR Interval:  182 QRS Duration:  110 QT Interval:  352 QTC Calculation: 444 R Axis:   61  Text Interpretation: Sinus rhythm with occasional Premature ventricular complexes Low voltage QRS Possible Anterolateral infarct , age undetermined ST & T wave abnormality, consider inferior ischemia Abnormal ECG When compared with ECG of 02-Mar-2022 15:50, No significant change since last tracing Confirmed by Benjiman Core [phone]) on 11/23/2022 9:03:50 PM  Radiology CT CHEST ABDOMEN PELVIS WO CONTRAST  Result Date: 11/23/2022 CLINICAL DATA:  Abdominal pain Patient reports epigastric/low chest pain for 3 days with mild SOB , denies emesis or diaphoresis . History of CABG/CAD . Hypertensive at triage EXAM: CT CHEST, ABDOMEN AND PELVIS WITHOUT CONTRAST TECHNIQUE: Multidetector CT imaging of the chest, abdomen and pelvis was performed following the standard protocol without IV contrast. RADIATION DOSE REDUCTION: This exam was performed according to the departmental dose-optimization program which includes automated exposure control, adjustment of the mA and/or kV according to patient size and/or use of iterative reconstruction technique. COMPARISON:  CT chest abdomen pelvis 08/20/2020 FINDINGS: CT CHEST FINDINGS  Cardiovascular: Enlarged left ventricle. No significant pericardial effusion. The thoracic aorta is normal in caliber. Severe atherosclerotic plaque of the thoracic aorta. Four-vessel coronary artery calcifications status post coronary artery bypass graft. Incidentally noted aberrant right subclavian artery. Mediastinum/Nodes: No gross hilar adenopathy, noting limited sensitivity for the detection of hilar adenopathy on this noncontrast study. No enlarged mediastinal or axillary lymph nodes. Thyroid gland, trachea, and esophagus demonstrate no significant findings. Tiny hiatal hernia. Lungs/Pleura: Mild interlobular septal wall thickening within the bilateral apices. Bilateral lower lobe passive atelectasis. Left lower lobe consolidation with air bronchograms. No focal consolidation. No pulmonary nodule. No pulmonary mass. Bilateral small to moderate pleural effusions. No pneumothorax. Musculoskeletal: Diffuse mild subcutaneus soft tissue edema. No suspicious lytic or blastic osseous lesions. No acute displaced fracture. CT ABDOMEN PELVIS FINDINGS Hepatobiliary: Redemonstration of an indeterminate, slightly more conspicuous, 1.5 x 0.7 cm  Peri hepatic soft tissue density. No focal liver abnormality. No gallstones, gallbladder wall thickening, or pericholecystic fluid. No biliary dilatation. Pancreas: Diffusely atrophic. No focal lesion. Otherwise normal pancreatic contour. No surrounding inflammatory changes. No main pancreatic ductal dilatation. Spleen: Normal in size without focal abnormality. Adrenals/Urinary Tract: No adrenal nodule bilaterally. No nephrolithiasis and no hydronephrosis. Fluid density lesions likely represent simple renal cysts. Simple renal cysts, in the absence of clinically indicated signs/symptoms, require no independent follow-up. No ureterolithiasis or hydroureter. The urinary bladder is unremarkable. Stomach/Bowel: Stomach is within normal limits. No evidence of bowel wall thickening or  dilatation. Appendix appears normal. Vascular/Lymphatic: No abdominal aorta or iliac aneurysm. Severe atherosclerotic plaque of the aorta and its branches. No abdominal, pelvic, or inguinal lymphadenopathy. Reproductive: Prostate is unremarkable. Other: Trace perihepatic and pelvic simple free fluid. No intraperitoneal free gas. No organized fluid collection. Musculoskeletal: Bilateral trace inguinal hernias containing fat and free fluid. Diffuse subcutaneus soft tissue edema. No suspicious lytic or blastic osseous lesions. No acute displaced fracture. Bilateral L5 pars interarticularis defects. Grade 1 anterolisthesis of L5 on S1. IMPRESSION: 1. Findings suggestive of volume overload. 2. Left lower lobe airspace opacity may represent a combination of atelectasis versus infection/inflammation-limited evaluation on this noncontrast study. 3. Cardiomegaly with mild pulmonary edema. 4. Bilateral small to moderate pleural effusions. 5. Trace simple free fluid ascites. 6. Anasarca. 7. Bilateral trace inguinal hernias containing fat and free fluid. 8. Bilateral L5 pars interarticularis defects with associated grade 1 anterolisthesis of L5 on S1. 9. Indeterminate 1.5 x 0.7 cm chronic peri hepatic soft tissue density. 10.  Aortic Atherosclerosis (ICD10-I70.0). Electronically Signed   By: Tish Frederickson M.D.   On: 11/23/2022 22:11   DG Chest 2 View  Result Date: 11/23/2022 CLINICAL DATA:  Chest pain. EXAM: CHEST - 2 VIEW COMPARISON:  PA Lat 01/22/2022 FINDINGS: The heart is enlarged, similar to previous study but with the interval development of mild perihilar vascular congestion and mild interstitial edema with a basal gradient. There is a small left and minimal layering right pleural effusions with patchy atelectasis or consolidation overlying the left pleural effusion. No other focal infiltrate is seen. There is a new 4 mm rounded nodule in the right upper lobe, visible only on the PA view. The mediastinum is  stable. There CABG changes and calcific plaque of the transverse aorta. Mild degenerative change thoracic spine. IMPRESSION: 1. Cardiomegaly with mild perihilar vascular congestion and mild interstitial edema. 2. Small left and minimal right pleural effusions with patchy atelectasis or consolidation overlying the left pleural effusion. 3. New 4 mm rounded nodule in the right upper lobe, visible only on the PA view. Follow-up chest CT recommended after treatment. Electronically Signed   By: Almira Bar M.D.   On: 11/23/2022 20:33    Procedures Procedures    Medications Ordered in ED Medications  furosemide (LASIX) injection 60 mg (has no administration in time range)    ED Course/ Medical Decision Making/ A&P                                 Medical Decision Making Amount and/or Complexity of Data Reviewed Labs: ordered. Radiology: ordered.  Risk Prescription drug management.   Patient shortness of breath and abdominal pain.  Has had for the last few days.  Has cardiac history.  CHF history.  Noncompliance history.  I think likely has had noncompliance.  Abdominal swelling may be edema.  However  with somewhat diffuse tenderness will get CT scan.  Will get noncontrast scan due to renal issues.  Chest x-ray all shows old nodule and will get chest x-ray to evaluate this.  Will weigh patient to see if there is been a weight change.  I think likely will need some diuresis.  Chest x-ray does show volume overload.  CT scan does show volume overload.  Left lower lobe potentially has atelectasis versus pneumonia.  I think more likely atelectasis with the effusion.  Also some anasarca.  Will give IV Lasix at this time.  Discussed with patient that he has not been taking his medicine.  Reportedly only taking his insulin.  I think he would benefit from mission to the hospital.  Will discuss with hospitalist.  Patient also hypertensive.       Final Clinical Impression(s) / ED  Diagnoses Final diagnoses:  Congestive heart failure, unspecified HF chronicity, unspecified heart failure type (HCC)  Noncompliance w/medication treatment due to intermit use of medication    Rx / DC Orders ED Discharge Orders     None         Benjiman Core, MD 11/23/22 2305

## 2022-11-23 NOTE — H&P (Signed)
History and Physical      Colin [name] MWU:[id number] DOB: [birth year] DOA: 11/23/2022; DOS: 11/23/2022  PCP: Grayce Sessions, NP *** Patient coming from: home ***  I have personally briefly reviewed patient's old medical records in [facility]  Chief Complaint: ***  HPI: Colin [name] is a 72 y.o. male with medical history significant for *** who is admitted to [facility] on 11/23/2022 with *** after presenting from home*** to [facility] ED complaining of ***.    ***       ***   ED Course:  Vital signs in the ED were notable for the following: ***  Labs were notable for the following: ***  Per my interpretation, EKG in ED demonstrated the following:  ***  Imaging in the ED, per corresponding formal radiology read, was notable for the following:  ***  While in the ED, the following were administered: ***  Subsequently, the patient was admitted  ***  ***red    Review of Systems: As per HPI otherwise 10 point review of systems negative.   Past Medical History:  Diagnosis Date   Arrhythmia    CAD (coronary artery disease)    stent x 2   Carotid artery occlusion    Diabetes mellitus    Dyslipidemia    Essential hypertension     Past Surgical History:  Procedure Laterality Date   CARDIAC CATHETERIZATION     01/2010 ; 04/2010   CORONARY ARTERY BYPASS GRAFT N/A 08/23/2020   Procedure: CORONARY ARTERY BYPASS GRAFTING (CABG) x 3 ON CARDIOPULMONARY BYPASS USING EVH AND PARTIALLY OPEN HARVESTED LEFT GREATER SAPHENOUS VEIN AND LEFT INTERNAL MAMMARY ARTERY.  LIMA TO LAD, SVG TO OM, SVG TO PDA;  Surgeon: Corliss Skains, MD;  Location: [facility] OR;  Service: Open Heart Surgery;  Laterality: N/A;   RIGHT HEART CATH N/A 12/19/2021   Procedure: RIGHT HEART CATH;  Surgeon: [name], MD;  Location: [facility] INVASIVE CV LAB;  Service: Cardiovascular;  Laterality: N/A;   RIGHT/LEFT HEART CATH AND CORONARY ANGIOGRAPHY N/A 08/22/2020   Procedure: RIGHT/LEFT HEART  CATH AND CORONARY ANGIOGRAPHY;  Surgeon: [name], MD;  Location: [facility] INVASIVE CV LAB;  Service: Cardiovascular;  Laterality: N/A;   TEE WITHOUT CARDIOVERSION N/A 08/23/2020   Procedure: TRANSESOPHAGEAL ECHOCARDIOGRAM (TEE);  Surgeon: Corliss Skains, MD;  Location: [facility] OR;  Service: Open Heart Surgery;  Laterality: N/A;   TEMPORARY PACEMAKER N/A 08/22/2020   Procedure: TEMPORARY PACEMAKER;  Surgeon: [name], MD;  Location: [facility] INVASIVE CV LAB;  Service: Cardiovascular;  Laterality: N/A;   VEIN HARVEST Left 08/23/2020   Procedure: ENDOVEIN HARVEST AND PARTIALLY OPEN GREATER SAPHENOUS VEIN HARVESTING;  Surgeon: Corliss Skains, MD;  Location: [facility] OR;  Service: Open Heart Surgery;  Laterality: Left;    Social History:  reports that he quit smoking about 2 years ago. His smoking use included cigarettes. He started smoking about 52 years ago. He has a 12.5 pack-year smoking history. He has never used smokeless tobacco. He reports that he does not drink alcohol and does not use drugs.   No Known Allergies  Family History  Problem Relation Age of Onset   Diabetes Neg Hx     Family history reviewed and not pertinent ***   Prior to Admission medications   Medication Sig Start Date End Date Taking? Authorizing Provider  amLODipine (NORVASC) 5 MG tablet Take 1 tablet (5 mg total) by mouth daily. 02/14/22 02/09/23  Patwardhan, Anabel Bene,  MD  clopidogrel (PLAVIX) 75 MG tablet Take 1 tablet (75 mg total) by mouth daily. 02/07/22   Custovic, Rozell Searing, DO  Continuous Glucose Receiver (FREESTYLE LIBRE 3 READER) DEVI Use as directed! 02/06/22   Reather Littler, MD  Continuous Glucose Sensor (FREESTYLE LIBRE 3 SENSOR) MISC Use as instructed change every 14 days 08/01/22   Altamese [place], MD  empagliflozin (JARDIANCE) 25 MG TABS tablet Take 1 tablet (25 mg total) by mouth daily. 08/01/22   Altamese Cole, MD  furosemide (LASIX) 20 MG tablet Take 60 mg by mouth 2 (two) times daily.    [provider]  furosemide (LASIX) 20 MG tablet TAKE 3 TABLETS BY MOUTH TWICE A DAY 02/14/22   Patwardhan, Manish J, MD  insulin isophane & regular human KwikPen (HUMULIN 70/30 KWIKPEN) (70-30) 100 UNIT/ML KwikPen Inject 40 Units into the skin 3 (three) times daily. 08/01/22   Motwani, Carin Hock, MD  Insulin Pen Needle (PEN NEEDLES 3/16") 31G X 5 MM MISC Uses three times daily with insulin. 05/24/22   Shamleffer, Konrad Dolores, MD  isosorbide mononitrate (IMDUR) 30 MG 24 hr tablet Take 1 tablet (30 mg total) by mouth daily. 02/14/22 02/09/23  Patwardhan, Anabel Bene, MD  ivabradine (CORLANOR) 5 MG TABS tablet Take 1 tablet (5 mg total) by mouth 2 (two) times daily with a meal. Patient not taking: Reported on 08/01/2022 01/12/22   Hoy Register, MD  metoprolol succinate (TOPROL-XL) 50 MG 24 hr tablet Take 1 tablet (50 mg total) by mouth daily. Take with or immediately following a meal. 02/14/22 02/09/23  Patwardhan, Anabel Bene, MD  nitroGLYCERIN (NITROSTAT) 0.4 MG SL tablet Place 1 tab (0.4mg  total) under the tongue every 5 minutes as needed for chest pain 02/14/22   Patwardhan, Manish J, MD  pantoprazole (PROTONIX) 20 MG tablet Take 1 tablet (20 mg total) by mouth daily. 02/07/22   Custovic, Rozell Searing, DO  rosuvastatin (CRESTOR) 10 MG tablet Take 1 tablet (10 mg total) by mouth daily. 06/20/22   Altamese Republic, MD     Objective    Physical Exam: Vitals:   11/23/22 2052 11/23/22 2053 11/23/22 2205 11/23/22 2300  BP:  (!) 202/101 (!) 211/103 (!) 203/96  Pulse:  90 92 97  Resp: 13  (!) 23 (!) 25  Temp:      SpO2:  97% 95% 94%    General: appears to be stated age; alert, oriented Skin: warm, dry, no rash Head:  AT/[place] Mouth:  Oral mucosa membranes appear moist, normal dentition Neck: supple; trachea midline Heart:  RRR; did not appreciate any M/R/G Lungs: CTAB, did not appreciate any wheezes, rales, or rhonchi Abdomen: + BS; soft, ND, NT Vascular: 2+ pedal pulses b/l; 2+ radial pulses b/l Extremities: no  peripheral edema, no muscle wasting Neuro: strength and sensation intact in upper and lower extremities b/l ***   *** Neuro: 5/5 strength of the proximal and distal flexors and extensors of the upper and lower extremities bilaterally; sensation intact in upper and lower extremities b/l; cranial nerves II through XII grossly intact; no pronator drift; no evidence suggestive of slurred speech, dysarthria, or facial droop; Normal muscle tone. No tremors.  *** Neuro: In the setting of the patient's current mental status and associated inability to follow instructions, unable to perform full neurologic exam at this time.  As such, assessment of strength, sensation, and cranial nerves is limited at this time. Patient noted to spontaneously move all 4 extremities. No tremors.  ***    Labs on Admission:  I have personally reviewed following labs and imaging studies  CBC: Recent Labs  Lab 11/23/22 1944  WBC 7.2  HGB 12.8*  HCT 40.0  MCV 93.9  PLT 239   Basic Metabolic Panel: Recent Labs  Lab 11/23/22 1944  NA 142  K 3.8  CL 106  CO2 28  GLUCOSE 243*  BUN 24*  CREATININE 2.40*  CALCIUM 9.0   GFR: CrCl cannot be calculated (Unknown ideal weight.). Liver Function Tests: Recent Labs  Lab 11/23/22 2145  AST 25  ALT 15  ALKPHOS 55  BILITOT 0.5  PROT 5.3*  ALBUMIN 1.6*   Recent Labs  Lab 11/23/22 1944  LIPASE 25   No results for input(s): "AMMONIA" in the last 168 hours. Coagulation Profile: No results for input(s): "INR", "PROTIME" in the last 168 hours. Cardiac Enzymes: No results for input(s): "CKTOTAL", "CKMB", "CKMBINDEX", "TROPONINI" in the last 168 hours. BNP (last 3 results) No results for input(s): "PROBNP" in the last 8760 hours. HbA1C: No results for input(s): "HGBA1C" in the last 72 hours. CBG: No results for input(s): "GLUCAP" in the last 168 hours. Lipid Profile: No results for input(s): "CHOL", "HDL", "LDLCALC", "TRIG", "CHOLHDL", "LDLDIRECT" in the  last 72 hours. Thyroid Function Tests: No results for input(s): "TSH", "T4TOTAL", "FREET4", "T3FREE", "THYROIDAB" in the last 72 hours. Anemia Panel: No results for input(s): "VITAMINB12", "FOLATE", "FERRITIN", "TIBC", "IRON", "RETICCTPCT" in the last 72 hours. Urine analysis:    Component Value Date/Time   COLORURINE YELLOW 01/22/2022 2146   APPEARANCEUR CLEAR 01/22/2022 2146   LABSPEC 1.021 01/22/2022 2146   PHURINE 7.0 01/22/2022 2146   GLUCOSEU >=500 (A) 01/22/2022 2146   HGBUR NEGATIVE 01/22/2022 2146   BILIRUBINUR NEGATIVE 01/22/2022 2146   BILIRUBINUR negative 05/06/2020 1113   KETONESUR NEGATIVE 01/22/2022 2146   PROTEINUR >=300 (A) 01/22/2022 2146   UROBILINOGEN 0.2 05/06/2020 1113   UROBILINOGEN 0.2 01/21/2010 1330   NITRITE NEGATIVE 01/22/2022 2146   LEUKOCYTESUR NEGATIVE 01/22/2022 2146    Radiological Exams on Admission: CT CHEST ABDOMEN PELVIS WO CONTRAST  Result Date: 11/23/2022 CLINICAL DATA:  Abdominal pain Patient reports epigastric/low chest pain for 3 days with mild SOB , denies emesis or diaphoresis . History of CABG/CAD . Hypertensive at triage EXAM: CT CHEST, ABDOMEN AND PELVIS WITHOUT CONTRAST TECHNIQUE: Multidetector CT imaging of the chest, abdomen and pelvis was performed following the standard protocol without IV contrast. RADIATION DOSE REDUCTION: This exam was performed according to the departmental dose-optimization program which includes automated exposure control, adjustment of the mA and/or kV according to patient size and/or use of iterative reconstruction technique. COMPARISON:  CT chest abdomen pelvis 08/20/2020 FINDINGS: CT CHEST FINDINGS Cardiovascular: Enlarged left ventricle. No significant pericardial effusion. The thoracic aorta is normal in caliber. Severe atherosclerotic plaque of the thoracic aorta. Four-vessel coronary artery calcifications status post coronary artery bypass graft. Incidentally noted aberrant right subclavian artery.  Mediastinum/Nodes: No gross hilar adenopathy, noting limited sensitivity for the detection of hilar adenopathy on this noncontrast study. No enlarged mediastinal or axillary lymph nodes. Thyroid gland, trachea, and esophagus demonstrate no significant findings. Tiny hiatal hernia. Lungs/Pleura: Mild interlobular septal wall thickening within the bilateral apices. Bilateral lower lobe passive atelectasis. Left lower lobe consolidation with air bronchograms. No focal consolidation. No pulmonary nodule. No pulmonary mass. Bilateral small to moderate pleural effusions. No pneumothorax. Musculoskeletal: Diffuse mild subcutaneus soft tissue edema. No suspicious lytic or blastic osseous lesions. No acute displaced fracture. CT ABDOMEN PELVIS FINDINGS Hepatobiliary: Redemonstration of an indeterminate, slightly more conspicuous,  1.5 x 0.7 cm Peri hepatic soft tissue density. No focal liver abnormality. No gallstones, gallbladder wall thickening, or pericholecystic fluid. No biliary dilatation. Pancreas: Diffusely atrophic. No focal lesion. Otherwise normal pancreatic contour. No surrounding inflammatory changes. No main pancreatic ductal dilatation. Spleen: Normal in size without focal abnormality. Adrenals/Urinary Tract: No adrenal nodule bilaterally. No nephrolithiasis and no hydronephrosis. Fluid density lesions likely represent simple renal cysts. Simple renal cysts, in the absence of clinically indicated signs/symptoms, require no independent follow-up. No ureterolithiasis or hydroureter. The urinary bladder is unremarkable. Stomach/Bowel: Stomach is within normal limits. No evidence of bowel wall thickening or dilatation. Appendix appears normal. Vascular/Lymphatic: No abdominal aorta or iliac aneurysm. Severe atherosclerotic plaque of the aorta and its branches. No abdominal, pelvic, or inguinal lymphadenopathy. Reproductive: Prostate is unremarkable. Other: Trace perihepatic and pelvic simple free fluid. No  intraperitoneal free gas. No organized fluid collection. Musculoskeletal: Bilateral trace inguinal hernias containing fat and free fluid. Diffuse subcutaneus soft tissue edema. No suspicious lytic or blastic osseous lesions. No acute displaced fracture. Bilateral L5 pars interarticularis defects. Grade 1 anterolisthesis of L5 on S1. IMPRESSION: 1. Findings suggestive of volume overload. 2. Left lower lobe airspace opacity may represent a combination of atelectasis versus infection/inflammation-limited evaluation on this noncontrast study. 3. Cardiomegaly with mild pulmonary edema. 4. Bilateral small to moderate pleural effusions. 5. Trace simple free fluid ascites. 6. Anasarca. 7. Bilateral trace inguinal hernias containing fat and free fluid. 8. Bilateral L5 pars interarticularis defects with associated grade 1 anterolisthesis of L5 on S1. 9. Indeterminate 1.5 x 0.7 cm chronic peri hepatic soft tissue density. 10.  Aortic Atherosclerosis (ICD10-I70.0). Electronically Signed   By: Tish Frederickson M.D.   On: 11/23/2022 22:11   DG Chest 2 View  Result Date: 11/23/2022 CLINICAL DATA:  Chest pain. EXAM: CHEST - 2 VIEW COMPARISON:  PA Lat 01/22/2022 FINDINGS: The heart is enlarged, similar to previous study but with the interval development of mild perihilar vascular congestion and mild interstitial edema with a basal gradient. There is a small left and minimal layering right pleural effusions with patchy atelectasis or consolidation overlying the left pleural effusion. No other focal infiltrate is seen. There is a new 4 mm rounded nodule in the right upper lobe, visible only on the PA view. The mediastinum is stable. There CABG changes and calcific plaque of the transverse aorta. Mild degenerative change thoracic spine. IMPRESSION: 1. Cardiomegaly with mild perihilar vascular congestion and mild interstitial edema. 2. Small left and minimal right pleural effusions with patchy atelectasis or consolidation overlying  the left pleural effusion. 3. New 4 mm rounded nodule in the right upper lobe, visible only on the PA view. Follow-up chest CT recommended after treatment. Electronically Signed   By: Almira Bar M.D.   On: 11/23/2022 20:33      Assessment/Plan   Principal Problem:   Acute on chronic systolic heart failure (HCC)   ***            ***                ***                 ***               ***               ***               ***                ***               ***               ***               ***               ***              ***          ***  DVT prophylaxis: SCD's ***  Code Status: Full code*** Family Communication: none*** Disposition Plan: Per Rounding Team Consults called: none***;  Admission status: ***     I SPENT GREATER THAN 75 *** MINUTES IN CLINICAL CARE TIME/MEDICAL DECISION-MAKING IN COMPLETING THIS ADMISSION.      Chaney Born Colin Arney DO [facility]  From 7PM - 7AM   11/23/2022, 11:19 PM   ***

## 2022-11-24 ENCOUNTER — Inpatient Hospital Stay (HOSPITAL_COMMUNITY): Payer: 59

## 2022-11-24 DIAGNOSIS — K219 Gastro-esophageal reflux disease without esophagitis: Secondary | ICD-10-CM | POA: Diagnosis present

## 2022-11-24 DIAGNOSIS — I169 Hypertensive crisis, unspecified: Secondary | ICD-10-CM | POA: Diagnosis not present

## 2022-11-24 DIAGNOSIS — I160 Hypertensive urgency: Secondary | ICD-10-CM | POA: Insufficient documentation

## 2022-11-24 DIAGNOSIS — I5021 Acute systolic (congestive) heart failure: Secondary | ICD-10-CM

## 2022-11-24 DIAGNOSIS — E1169 Type 2 diabetes mellitus with other specified complication: Secondary | ICD-10-CM

## 2022-11-24 DIAGNOSIS — R1013 Epigastric pain: Secondary | ICD-10-CM | POA: Insufficient documentation

## 2022-11-24 DIAGNOSIS — I5023 Acute on chronic systolic (congestive) heart failure: Secondary | ICD-10-CM

## 2022-11-24 DIAGNOSIS — R7989 Other specified abnormal findings of blood chemistry: Secondary | ICD-10-CM | POA: Insufficient documentation

## 2022-11-24 DIAGNOSIS — N184 Chronic kidney disease, stage 4 (severe): Secondary | ICD-10-CM | POA: Diagnosis not present

## 2022-11-24 DIAGNOSIS — E785 Hyperlipidemia, unspecified: Secondary | ICD-10-CM

## 2022-11-24 LAB — CBC WITH DIFFERENTIAL/PLATELET
Abs Immature Granulocytes: 0.02 10*3/uL (ref 0.00–0.07)
Basophils Absolute: 0.1 10*3/uL (ref 0.0–0.1)
Basophils Relative: 1 %
Eosinophils Absolute: 0.3 10*3/uL (ref 0.0–0.5)
Eosinophils Relative: 5 %
HCT: 33.4 % — ABNORMAL LOW (ref 39.0–52.0)
Hemoglobin: 11 g/dL — ABNORMAL LOW (ref 13.0–17.0)
Immature Granulocytes: 0 %
Lymphocytes Relative: 22 %
Lymphs Abs: 1.2 10*3/uL (ref 0.7–4.0)
MCH: 29.8 pg (ref 26.0–34.0)
MCHC: 32.9 g/dL (ref 30.0–36.0)
MCV: 90.5 fL (ref 80.0–100.0)
Monocytes Absolute: 0.4 10*3/uL (ref 0.1–1.0)
Monocytes Relative: 8 %
Neutro Abs: 3.5 10*3/uL (ref 1.7–7.7)
Neutrophils Relative %: 64 %
Platelets: 212 10*3/uL (ref 150–400)
RBC: 3.69 MIL/uL — ABNORMAL LOW (ref 4.22–5.81)
RDW: 13.3 % (ref 11.5–15.5)
WBC: 5.5 10*3/uL (ref 4.0–10.5)
nRBC: 0 % (ref 0.0–0.2)

## 2022-11-24 LAB — COMPREHENSIVE METABOLIC PANEL
ALT: 12 U/L (ref 0–44)
AST: 18 U/L (ref 15–41)
Albumin: <1.5 g/dL — ABNORMAL LOW (ref 3.5–5.0)
Alkaline Phosphatase: 56 U/L (ref 38–126)
Anion gap: 14 (ref 5–15)
BUN: 23 mg/dL (ref 8–23)
CO2: 26 mmol/L (ref 22–32)
Calcium: 9.6 mg/dL (ref 8.9–10.3)
Chloride: 103 mmol/L (ref 98–111)
Creatinine, Ser: 2.4 mg/dL — ABNORMAL HIGH (ref 0.61–1.24)
GFR, Estimated: 28 mL/min — ABNORMAL LOW (ref >60)
Glucose, Bld: 223 mg/dL — ABNORMAL HIGH (ref 70–99)
Potassium: 3.5 mmol/L (ref 3.5–5.1)
Sodium: 143 mmol/L (ref 135–145)
Total Bilirubin: 0.7 mg/dL (ref 0.3–1.2)
Total Protein: 4.1 g/dL — ABNORMAL LOW (ref 6.5–8.1)

## 2022-11-24 LAB — PROCALCITONIN: Procalcitonin: <0.1 ng/mL

## 2022-11-24 LAB — ECHOCARDIOGRAM COMPLETE
Height: 63 in
MV M vel: 6.88 m/s
MV Peak grad: 189.3 mm[Hg]
Radius: 0.6 cm
S' Lateral: 4.3 cm
Single Plane A2C EF: 23.9 %
Weight: 2240 [oz_av]

## 2022-11-24 LAB — GLUCOSE, CAPILLARY
Glucose-Capillary: 247 mg/dL — ABNORMAL HIGH (ref 70–99)
Glucose-Capillary: 130 mg/dL — ABNORMAL HIGH (ref 70–99)
Glucose-Capillary: 111 mg/dL — ABNORMAL HIGH (ref 70–99)

## 2022-11-24 LAB — PHOSPHORUS: Phosphorus: 3.5 mg/dL (ref 2.5–4.6)

## 2022-11-24 LAB — CBG MONITORING, ED
Glucose-Capillary: 202 mg/dL — ABNORMAL HIGH (ref 70–99)
Glucose-Capillary: 292 mg/dL — ABNORMAL HIGH (ref 70–99)

## 2022-11-24 LAB — HEMOGLOBIN A1C
Hgb A1c MFr Bld: 10.8 % — ABNORMAL HIGH (ref 4.8–5.6)
Mean Plasma Glucose: 263.26 mg/dL

## 2022-11-24 LAB — MRSA NEXT GEN BY PCR, NASAL: MRSA by PCR Next Gen: NOT DETECTED

## 2022-11-24 LAB — MAGNESIUM: Magnesium: 1.8 mg/dL (ref 1.7–2.4)

## 2022-11-24 MED ORDER — MAGNESIUM SULFATE 2 GM/50ML IV SOLN
2.0000 g | Freq: Once | INTRAVENOUS | Status: AC
  Administered 2022-11-24: 2 g via INTRAVENOUS
  Filled 2022-11-24: qty 50

## 2022-11-24 MED ORDER — POTASSIUM CHLORIDE CRYS ER 20 MEQ PO TBCR
20.0000 meq | EXTENDED_RELEASE_TABLET | Freq: Once | ORAL | Status: AC
  Administered 2022-11-24: 20 meq via ORAL
  Filled 2022-11-24: qty 1

## 2022-11-24 MED ORDER — INSULIN GLARGINE-YFGN 100 UNIT/ML ~~LOC~~ SOLN
15.0000 [IU] | Freq: Every day | SUBCUTANEOUS | Status: DC
  Administered 2022-11-25 – 2022-11-27 (×3): 15 [IU] via SUBCUTANEOUS
  Filled 2022-11-24 (×4): qty 0.15

## 2022-11-24 MED ORDER — HYDRALAZINE HCL 20 MG/ML IJ SOLN
10.0000 mg | Freq: Four times a day (QID) | INTRAMUSCULAR | Status: DC | PRN
  Administered 2022-11-24 – 2022-11-25 (×2): 10 mg via INTRAVENOUS
  Filled 2022-11-24 (×2): qty 1

## 2022-11-24 MED ORDER — AMLODIPINE BESYLATE 5 MG PO TABS
5.0000 mg | ORAL_TABLET | Freq: Every day | ORAL | Status: DC
  Administered 2022-11-25: 5 mg via ORAL
  Filled 2022-11-24: qty 1

## 2022-11-24 MED ORDER — INFLUENZA VAC A&B SURF ANT ADJ 0.5 ML IM SUSY
0.5000 mL | PREFILLED_SYRINGE | INTRAMUSCULAR | Status: DC
  Filled 2022-11-24: qty 0.5

## 2022-11-24 MED ORDER — FUROSEMIDE 10 MG/ML IJ SOLN
60.0000 mg | Freq: Two times a day (BID) | INTRAMUSCULAR | Status: DC
  Administered 2022-11-24 – 2022-11-25 (×2): 60 mg via INTRAVENOUS
  Filled 2022-11-24 (×2): qty 6

## 2022-11-24 NOTE — Assessment & Plan Note (Signed)
Echocardiogram with reduced LV systolic function EF 25 to 30%, global hypokinesis, mild asymmetric LVH of the lateral segment. RV systolic function preserved. LA with moderate dilatation, mild to moderate MR, no pericardial effusion.   Urine output is 2,400 ml Since admission is fluid balance is negative -4.393 Systolic blood pressure 130 to 150 mmHg.   Patient had 80 mg IV furosemide this morning, will hold on PM dose for now.  Continue after load reduction with hydralazine and isosorbide.  Hold on RAAS until renal function more stable Holding ivabradine for now.  Continue with ted hose.    Volume status continue to improve, will consult heart failure team for further optimization of medical therapy.

## 2022-11-24 NOTE — ED Notes (Signed)
ED TO INPATIENT HANDOFF REPORT  ED Nurse Name and Phone #:  Colin [name] 76  S Name/Age/Gender Colin [name] 72 y.o. male Room/Bed: 003C/003C  Code Status   Code Status: Full Code  Home/SNF/Other Home Patient oriented to: self, place, time, and situation Is this baseline? Yes   Triage Complete: Triage complete  Chief Complaint Acute on chronic systolic heart failure (HCC) [I50.23]  Triage Note Patient reports epigastric/low chest pain for 3 days with mild SOB , denies emesis or diaphoresis . History of CABG/CAD . Hypertensive at triage .    Allergies No Known Allergies  Level of Care/Admitting Diagnosis ED Disposition     ED Disposition  Admit   Condition  --   Comment  Hospital [facility]: [facility] [[id number]]  Level of Care: Progressive [102]  Admit to Progressive based on following criteria: MULTISYSTEM THREATS such as stable sepsis, metabolic/electrolyte imbalance with or without encephalopathy that is responding to early treatment.  May admit patient to [facility] or [facility] if equivalent level of care is available:: No  Covid Evaluation: Asymptomatic - no recent exposure (last 10 days) testing not required  Diagnosis: Acute on chronic systolic heart failure (HCC) [428.23.ICD-9-CM]  Admitting Physician: Angie Fava [[id number]]  Attending Physician: Angie Fava [[id number]]  Certification:: I certify this patient will need inpatient services for at least 2 midnights  Expected Medical Readiness: 11/25/2022          B Medical/Surgery History Past Medical History:  Diagnosis Date   Arrhythmia    CAD (coronary artery disease)    stent x 2   Carotid artery occlusion    Chronic combined systolic and diastolic heart failure (HCC)    Diabetes mellitus    Dyslipidemia    Essential hypertension    Past Surgical History:  Procedure Laterality Date   CARDIAC CATHETERIZATION     01/2010 ; 04/2010   CORONARY ARTERY BYPASS GRAFT N/A  08/23/2020   Procedure: CORONARY ARTERY BYPASS GRAFTING (CABG) x 3 ON CARDIOPULMONARY BYPASS USING EVH AND PARTIALLY OPEN HARVESTED LEFT GREATER SAPHENOUS VEIN AND LEFT INTERNAL MAMMARY ARTERY.  LIMA TO LAD, SVG TO OM, SVG TO PDA;  Surgeon: Corliss Skains, MD;  Location: [facility] OR;  Service: Open Heart Surgery;  Laterality: N/A;   RIGHT HEART CATH N/A 12/19/2021   Procedure: RIGHT HEART CATH;  Surgeon: [name], MD;  Location: [facility] INVASIVE CV LAB;  Service: Cardiovascular;  Laterality: N/A;   RIGHT/LEFT HEART CATH AND CORONARY ANGIOGRAPHY N/A 08/22/2020   Procedure: RIGHT/LEFT HEART CATH AND CORONARY ANGIOGRAPHY;  Surgeon: [name], MD;  Location: [facility] INVASIVE CV LAB;  Service: Cardiovascular;  Laterality: N/A;   TEE WITHOUT CARDIOVERSION N/A 08/23/2020   Procedure: TRANSESOPHAGEAL ECHOCARDIOGRAM (TEE);  Surgeon: Corliss Skains, MD;  Location: [facility] OR;  Service: Open Heart Surgery;  Laterality: N/A;   TEMPORARY PACEMAKER N/A 08/22/2020   Procedure: TEMPORARY PACEMAKER;  Surgeon: [name], MD;  Location: [facility] INVASIVE CV LAB;  Service: Cardiovascular;  Laterality: N/A;   VEIN HARVEST Left 08/23/2020   Procedure: ENDOVEIN HARVEST AND PARTIALLY OPEN GREATER SAPHENOUS VEIN HARVESTING;  Surgeon: Corliss Skains, MD;  Location: [facility] OR;  Service: Open Heart Surgery;  Laterality: Left;     A IV Location/Drains/Wounds Patient Lines/Drains/Airways Status     Active Line/Drains/Airways     Name Placement date Placement time Site Days   Peripheral IV 11/23/22 20 G Right Antecubital 11/23/22  2104  Antecubital   1  Peripheral IV 11/24/22 20 G 1" Anterior;Left Forearm 11/24/22  1134  Forearm  less than 1            Intake/Output Last 24 hours  Intake/Output Summary (Last 24 hours) at 11/24/2022 1514 Last data filed at 11/24/2022 1610 Gross per 24 hour  Intake 0 ml  Output 1600 ml  Net -1600 ml    Labs/Imaging Results for orders placed or performed  during the hospital encounter of 11/23/22 (from the past 48 hour(s))  Basic metabolic panel     Status: Abnormal   Collection Time: 11/23/22  7:44 PM  Result Value Ref Range   Sodium 142 135 - 145 mmol/L   Potassium 3.8 3.5 - 5.1 mmol/L   Chloride 106 98 - 111 mmol/L   CO2 28 22 - 32 mmol/L   Glucose, Bld 243 (H) 70 - 99 mg/dL    Comment: Glucose reference range applies only to samples taken after fasting for at least 8 hours.   BUN 24 (H) 8 - 23 mg/dL   Creatinine, Ser 9.60 (H) 0.61 - 1.24 mg/dL   Calcium 9.0 8.9 - 45.4 mg/dL   GFR, Estimated 28 (L) >60 mL/min    Comment: (NOTE) Calculated using the CKD-EPI Creatinine Equation (2021)    Anion gap 8 5 - 15    Comment: Performed at [facility], [address], [place], [place] [address]  CBC     Status: Abnormal   Collection Time: 11/23/22  7:44 PM  Result Value Ref Range   WBC 7.2 4.0 - 10.5 K/uL   RBC 4.26 4.22 - 5.81 MIL/uL   Hemoglobin 12.8 (L) 13.0 - 17.0 g/dL   HCT 91.4 78.2 - 95.6 %   MCV 93.9 80.0 - 100.0 fL   MCH 30.0 26.0 - 34.0 pg   MCHC 32.0 30.0 - 36.0 g/dL   RDW 21.3 08.6 - 57.8 %   Platelets 239 150 - 400 K/uL   nRBC 0.0 0.0 - 0.2 %    Comment: Performed at [facility], [address], [place], [place] [address]  Troponin I (High Sensitivity)     Status: Abnormal   Collection Time: 11/23/22  7:44 PM  Result Value Ref Range   Troponin I (High Sensitivity) 87 (H) <18 ng/L    Comment: (NOTE) Elevated high sensitivity troponin I (hsTnI) values and significant  changes across serial measurements may suggest ACS but many other  chronic and acute conditions are known to elevate hsTnI results.  Refer to the "Links" section for chest pain algorithms and additional  guidance. Performed at [facility], [address] [place], [place] [address]   Lipase, blood     Status: None   Collection Time: 11/23/22  7:44 PM  Result Value Ref Range   Lipase 25 11 - 51 U/L    Comment: Performed at [facility], [address] [place], [place] [address]  Troponin I (High Sensitivity)     Status: Abnormal   Collection Time: 11/23/22  9:45 PM  Result Value Ref Range   Troponin I (High Sensitivity) 82 (H) <18 ng/L    Comment: (NOTE) Elevated high sensitivity troponin I (hsTnI) values and significant  changes across serial measurements may suggest ACS but many other  chronic and acute conditions are known to elevate hsTnI results.  Refer to the "Links" section for chest pain algorithms and additional  guidance. Performed at [facility], [address] [place], [place] [address]   Hepatic function panel     Status: Abnormal   Collection Time: 11/23/22  9:45 PM  Result Value Ref Range   Total Protein 5.3 (L) 6.5 - 8.1 g/dL   Albumin 1.6 (L) 3.5 - 5.0 g/dL   AST 25 15 - 41 U/L   ALT 15 0 - 44 U/L   Alkaline Phosphatase 55 38 - 126 U/L   Total Bilirubin 0.5 0.3 - 1.2 mg/dL   Bilirubin, Direct 0.2 0.0 - 0.2 mg/dL   Indirect Bilirubin 0.3 0.3 - 0.9 mg/dL    Comment: Performed at [facility], [address] [place], [place] [address]  Brain natriuretic peptide     Status: Abnormal   Collection Time: 11/23/22  9:45 PM  Result Value Ref Range   B Natriuretic Peptide 1,693.7 (H) 0.0 - 100.0 pg/mL    Comment: Performed at [facility], [address], [place], [place] [address]  Magnesium     Status: None   Collection Time: 11/23/22  9:45 PM  Result Value Ref Range   Magnesium 2.0 1.7 - 2.4 mg/dL    Comment: Performed at [facility], [address], [place], [place] [address]  CBC with Differential/Platelet     Status: Abnormal   Collection Time: 11/24/22  5:22 AM  Result Value Ref Range   WBC 5.5 4.0 - 10.5 K/uL   RBC 3.69 (L) 4.22 - 5.81 MIL/uL   Hemoglobin 11.0 (L) 13.0 - 17.0 g/dL   HCT 25.3 (L) 66.4 - 40.3 %   MCV 90.5 80.0 - 100.0 fL   MCH 29.8 26.0 - 34.0 pg   MCHC 32.9 30.0 - 36.0 g/dL   RDW 47.4 25.9 - 56.3 %   Platelets 212 150 - 400 K/uL    nRBC 0.0 0.0 - 0.2 %   Neutrophils Relative % 64 %   Neutro Abs 3.5 1.7 - 7.7 K/uL   Lymphocytes Relative 22 %   Lymphs Abs 1.2 0.7 - 4.0 K/uL   Monocytes Relative 8 %   Monocytes Absolute 0.4 0.1 - 1.0 K/uL   Eosinophils Relative 5 %   Eosinophils Absolute 0.3 0.0 - 0.5 K/uL   Basophils Relative 1 %   Basophils Absolute 0.1 0.0 - 0.1 K/uL   Immature Granulocytes 0 %   Abs Immature Granulocytes 0.02 0.00 - 0.07 K/uL    Comment: Performed at [facility], [address], [place], [place] [address]  Comprehensive metabolic panel     Status: Abnormal   Collection Time: 11/24/22  5:22 AM  Result Value Ref Range   Sodium 143 135 - 145 mmol/L   Potassium 3.5 3.5 - 5.1 mmol/L   Chloride 103 98 - 111 mmol/L   CO2 26 22 - 32 mmol/L   Glucose, Bld 223 (H) 70 - 99 mg/dL    Comment: Glucose reference range applies only to samples taken after fasting for at least 8 hours.   BUN 23 8 - 23 mg/dL   Creatinine, Ser 3.32 (H) 0.61 - 1.24 mg/dL   Calcium 9.6 8.9 - 95.1 mg/dL   Total Protein 4.1 (L) 6.5 - 8.1 g/dL   Albumin <8.8 (L) 3.5 - 5.0 g/dL   AST 18 15 - 41 U/L   ALT 12 0 - 44 U/L   Alkaline Phosphatase 56 38 - 126 U/L   Total Bilirubin 0.7 0.3 - 1.2 mg/dL   GFR, Estimated 28 (L) >60 mL/min  Comment: (NOTE) Calculated using the CKD-EPI Creatinine Equation (2021)    Anion gap 14 5 - 15    Comment: Performed at [facility] Lab, [address], [place], [place] [address]  Magnesium     Status: None   Collection Time: 11/24/22  5:22 AM  Result Value Ref Range   Magnesium 1.8 1.7 - 2.4 mg/dL    Comment: Performed at [facility], [address], [place], [place] [address]  Phosphorus     Status: None   Collection Time: 11/24/22  5:22 AM  Result Value Ref Range   Phosphorus 3.5 2.5 - 4.6 mg/dL    Comment: Performed at [facility], [address], [place], [place] [address]  Procalcitonin     Status: None   Collection Time: 11/24/22  5:22 AM  Result Value Ref  Range   Procalcitonin <0.10 ng/mL    Comment:        Interpretation: PCT (Procalcitonin) <= 0.5 ng/mL: Systemic infection (sepsis) is not likely. Local bacterial infection is possible. (NOTE)       Sepsis PCT Algorithm           Lower Respiratory Tract                                      Infection PCT Algorithm    ----------------------------     ----------------------------         PCT < 0.25 ng/mL                PCT < 0.10 ng/mL          Strongly encourage             Strongly discourage   discontinuation of antibiotics    initiation of antibiotics    ----------------------------     -----------------------------       PCT 0.25 - 0.50 ng/mL            PCT 0.10 - 0.25 ng/mL               OR       >80% decrease in PCT            Discourage initiation of                                            antibiotics      Encourage discontinuation           of antibiotics    ----------------------------     -----------------------------         PCT >= 0.50 ng/mL              PCT 0.26 - 0.50 ng/mL               AND        <80% decrease in PCT             Encourage initiation of                                             antibiotics       Encourage continuation  of antibiotics    ----------------------------     -----------------------------        PCT >= 0.50 ng/mL                  PCT > 0.50 ng/mL               AND         increase in PCT                  Strongly encourage                                      initiation of antibiotics    Strongly encourage escalation           of antibiotics                                     -----------------------------                                           PCT <= 0.25 ng/mL                                                 OR                                        > 80% decrease in PCT                                      Discontinue / Do not initiate                                             antibiotics  Performed at [facility], [address] [place], [place] [address]   Hemoglobin A1c     Status: Abnormal   Collection Time: 11/24/22  5:22 AM  Result Value Ref Range   Hgb A1c MFr Bld 10.8 (H) 4.8 - 5.6 %    Comment: (NOTE) Pre diabetes:          5.7%-6.4%  Diabetes:              >6.4%  Glycemic control for   <7.0% adults with diabetes    Mean Plasma Glucose 263.26 mg/dL    Comment: Performed at [facility], [address] [place], [place] [address]  CBG monitoring, ED     Status: Abnormal   Collection Time: 11/24/22  7:44 AM  Result Value Ref Range   Glucose-Capillary 202 (H) 70 - 99 mg/dL    Comment: Glucose reference range applies only to samples taken after fasting for at least 8 hours.  CBG monitoring, ED     Status: Abnormal   Collection Time: 11/24/22 12:17 PM  Result Value Ref Range  Glucose-Capillary 292 (H) 70 - 99 mg/dL    Comment: Glucose reference range applies only to samples taken after fasting for at least 8 hours.   ECHOCARDIOGRAM COMPLETE  Result Date: 11/24/2022    ECHOCARDIOGRAM REPORT   Patient Name:   Colin [name] [facility] Date of Exam: 11/24/2022 Medical Rec #:  [id number]    Height:       63.0 in Accession #:    [id number]   Weight:       140.0 lb Date of Birth:  05/30/1950    BSA:          1.662 m Patient Age:    72 years     BP:           157/77 mmHg Patient Gender: M            HR:           84 bpm. Exam Location:  Inpatient Procedure: 2D Echo, Color Doppler and Cardiac Doppler Indications:    acute systolic chf  History:        Patient has prior history of Echocardiogram examinations, most                 recent 12/17/2021. CAD, Prior CABG, chronic kidney disease and                 COPD; Risk Factors:Diabetes, Hypertension, Dyslipidemia and                 Former Smoker.  Sonographer:    [name] RDCS Referring Phys: [id number] JUSTIN B HOWERTER IMPRESSIONS  1. Left ventricular ejection fraction, by estimation, is 25 to 30%. The left ventricle has severely decreased  function. The left ventricle demonstrates global hypokinesis. There is mild asymmetric left ventricular hypertrophy of the lateral segment. Left ventricular diastolic parameters are consistent with Grade I diastolic dysfunction (impaired relaxation). Elevated left ventricular end-diastolic pressure.  2. Right ventricular systolic function is normal. The right ventricular size is normal. Tricuspid regurgitation signal is inadequate for assessing PA pressure.  3. Left atrial size was mild to moderately dilated.  4. Large pleural effusion in the left lateral region.  5. The mitral valve is normal in structure. Mild to moderate mitral valve regurgitation. No evidence of mitral stenosis.  6. The aortic valve is tricuspid. There is moderate calcification of the aortic valve. Aortic valve regurgitation is not visualized. No aortic stenosis is present.  7. The inferior vena cava is normal in size with greater than 50% respiratory variability, suggesting right atrial pressure of 3 mmHg. Comparison(s): No significant change from prior study. FINDINGS  Left Ventricle: Left ventricular ejection fraction, by estimation, is 25 to 30%. The left ventricle has severely decreased function. The left ventricle demonstrates global hypokinesis. The left ventricular internal cavity size was normal in size. There is mild asymmetric left ventricular hypertrophy of the lateral segment. Left ventricular diastolic parameters are consistent with Grade I diastolic dysfunction (impaired relaxation). Elevated left ventricular end-diastolic pressure. Right Ventricle: The right ventricular size is normal. No increase in right ventricular wall thickness. Right ventricular systolic function is normal. Tricuspid regurgitation signal is inadequate for assessing PA pressure. Left Atrium: Left atrial size was mild to moderately dilated. Right Atrium: Right atrial size was normal in size. Pericardium: There is no evidence of pericardial effusion. Mitral  Valve: The mitral valve is normal in structure. Mild to moderate mitral valve regurgitation. No evidence of mitral valve stenosis. Tricuspid Valve: The tricuspid valve is normal in  structure. Tricuspid valve regurgitation is not demonstrated. No evidence of tricuspid stenosis. Aortic Valve: The aortic valve is tricuspid. There is moderate calcification of the aortic valve. Aortic valve regurgitation is not visualized. No aortic stenosis is present. Pulmonic Valve: The pulmonic valve was normal in structure. Pulmonic valve regurgitation is not visualized. No evidence of pulmonic stenosis. Aorta: The aortic root is normal in size and structure. Venous: The inferior vena cava is normal in size with greater than 50% respiratory variability, suggesting right atrial pressure of 3 mmHg. IAS/Shunts: No atrial level shunt detected by color flow Doppler. Additional Comments: There is a large pleural effusion in the left lateral region.  LEFT VENTRICLE PLAX 2D LVIDd:         5.00 cm      Diastology LVIDs:         4.30 cm      LV e' medial:    3.15 cm/s LV PW:         1.20 cm      LV E/e' medial:  19.6 LV IVS:        1.00 cm      LV e' lateral:   4.03 cm/s LVOT diam:     2.00 cm      LV E/e' lateral: 15.3 LV SV:         49 LV SV Index:   29 LVOT Area:     3.14 cm  LV Volumes (MOD) LV vol d, MOD A2C: 128.0 ml LV vol d, MOD A4C: 112.0 ml LV vol s, MOD A2C: 97.4 ml LV SV MOD A2C:     30.6 ml LV SV MOD A4C:     112.0 ml RIGHT VENTRICLE            IVC RV Basal diam:  2.60 cm    IVC diam: 1.50 cm RV S prime:     9.79 cm/s TAPSE (M-mode): 1.4 cm LEFT ATRIUM             Index        RIGHT ATRIUM           Index LA diam:        4.00 cm 2.41 cm/m   RA Area:     12.30 cm LA Vol (A2C):   88.5 ml 53.26 ml/m  RA Volume:   27.90 ml  16.79 ml/m LA Vol (A4C):   41.7 ml 25.10 ml/m LA Biplane Vol: 65.8 ml 39.60 ml/m  AORTIC VALVE LVOT Vmax:   81.20 cm/s LVOT Vmean:  53.400 cm/s LVOT VTI:    0.155 m  AORTA Ao Root diam: 2.80 cm MR Peak  grad:    189.3 mmHg MR Mean grad:    109.0 mmHg   SHUNTS MR Vmax:         688.00 cm/s  Systemic VTI:  0.16 m MR Vmean:        476.0 cm/s   Systemic Diam: 2.00 cm MR PISA:         2.26 cm MR PISA Eff ROA: 13 mm MR PISA Radius:  0.60 cm MV E velocity: 61.60 cm/s MV A velocity: 122.00 cm/s MV E/A ratio:  0.50 Vishnu Priya Mallipeddi Electronically signed by Winfield Rast Mallipeddi Signature Date/Time: 11/24/2022/1:26:09 PM    Final    CT CHEST ABDOMEN PELVIS WO CONTRAST  Result Date: 11/23/2022 CLINICAL DATA:  Abdominal pain Patient reports epigastric/low chest pain for 3 days with mild SOB , denies emesis or diaphoresis . History of  CABG/CAD . Hypertensive at triage EXAM: CT CHEST, ABDOMEN AND PELVIS WITHOUT CONTRAST TECHNIQUE: Multidetector CT imaging of the chest, abdomen and pelvis was performed following the standard protocol without IV contrast. RADIATION DOSE REDUCTION: This exam was performed according to the departmental dose-optimization program which includes automated exposure control, adjustment of the mA and/or kV according to patient size and/or use of iterative reconstruction technique. COMPARISON:  CT chest abdomen pelvis 08/20/2020 FINDINGS: CT CHEST FINDINGS Cardiovascular: Enlarged left ventricle. No significant pericardial effusion. The thoracic aorta is normal in caliber. Severe atherosclerotic plaque of the thoracic aorta. Four-vessel coronary artery calcifications status post coronary artery bypass graft. Incidentally noted aberrant right subclavian artery. Mediastinum/Nodes: No gross hilar adenopathy, noting limited sensitivity for the detection of hilar adenopathy on this noncontrast study. No enlarged mediastinal or axillary lymph nodes. Thyroid gland, trachea, and esophagus demonstrate no significant findings. Tiny hiatal hernia. Lungs/Pleura: Mild interlobular septal wall thickening within the bilateral apices. Bilateral lower lobe passive atelectasis. Left lower lobe consolidation  with air bronchograms. No focal consolidation. No pulmonary nodule. No pulmonary mass. Bilateral small to moderate pleural effusions. No pneumothorax. Musculoskeletal: Diffuse mild subcutaneus soft tissue edema. No suspicious lytic or blastic osseous lesions. No acute displaced fracture. CT ABDOMEN PELVIS FINDINGS Hepatobiliary: Redemonstration of an indeterminate, slightly more conspicuous, 1.5 x 0.7 cm Peri hepatic soft tissue density. No focal liver abnormality. No gallstones, gallbladder wall thickening, or pericholecystic fluid. No biliary dilatation. Pancreas: Diffusely atrophic. No focal lesion. Otherwise normal pancreatic contour. No surrounding inflammatory changes. No main pancreatic ductal dilatation. Spleen: Normal in size without focal abnormality. Adrenals/Urinary Tract: No adrenal nodule bilaterally. No nephrolithiasis and no hydronephrosis. Fluid density lesions likely represent simple renal cysts. Simple renal cysts, in the absence of clinically indicated signs/symptoms, require no independent follow-up. No ureterolithiasis or hydroureter. The urinary bladder is unremarkable. Stomach/Bowel: Stomach is within normal limits. No evidence of bowel wall thickening or dilatation. Appendix appears normal. Vascular/Lymphatic: No abdominal aorta or iliac aneurysm. Severe atherosclerotic plaque of the aorta and its branches. No abdominal, pelvic, or inguinal lymphadenopathy. Reproductive: Prostate is unremarkable. Other: Trace perihepatic and pelvic simple free fluid. No intraperitoneal free gas. No organized fluid collection. Musculoskeletal: Bilateral trace inguinal hernias containing fat and free fluid. Diffuse subcutaneus soft tissue edema. No suspicious lytic or blastic osseous lesions. No acute displaced fracture. Bilateral L5 pars interarticularis defects. Grade 1 anterolisthesis of L5 on S1. IMPRESSION: 1. Findings suggestive of volume overload. 2. Left lower lobe airspace opacity may represent a  combination of atelectasis versus infection/inflammation-limited evaluation on this noncontrast study. 3. Cardiomegaly with mild pulmonary edema. 4. Bilateral small to moderate pleural effusions. 5. Trace simple free fluid ascites. 6. Anasarca. 7. Bilateral trace inguinal hernias containing fat and free fluid. 8. Bilateral L5 pars interarticularis defects with associated grade 1 anterolisthesis of L5 on S1. 9. Indeterminate 1.5 x 0.7 cm chronic peri hepatic soft tissue density. 10.  Aortic Atherosclerosis (ICD10-I70.0). Electronically Signed   By: Tish Frederickson M.D.   On: 11/23/2022 22:11   DG Chest 2 View  Result Date: 11/23/2022 CLINICAL DATA:  Chest pain. EXAM: CHEST - 2 VIEW COMPARISON:  PA Lat 01/22/2022 FINDINGS: The heart is enlarged, similar to previous study but with the interval development of mild perihilar vascular congestion and mild interstitial edema with a basal gradient. There is a small left and minimal layering right pleural effusions with patchy atelectasis or consolidation overlying the left pleural effusion. No other focal infiltrate is seen. There is a new 4 mm  rounded nodule in the right upper lobe, visible only on the PA view. The mediastinum is stable. There CABG changes and calcific plaque of the transverse aorta. Mild degenerative change thoracic spine. IMPRESSION: 1. Cardiomegaly with mild perihilar vascular congestion and mild interstitial edema. 2. Small left and minimal right pleural effusions with patchy atelectasis or consolidation overlying the left pleural effusion. 3. New 4 mm rounded nodule in the right upper lobe, visible only on the PA view. Follow-up chest CT recommended after treatment. Electronically Signed   By: Almira Bar M.D.   On: 11/23/2022 20:33    Pending Labs Unresulted Labs (From admission, onward)     Start     Ordered   11/25/22 0500  Basic metabolic panel  Tomorrow morning,   R        11/24/22 1435   11/25/22 0500  Magnesium  Tomorrow morning,    R        11/24/22 1435   11/23/22 2350  Parathyroid hormone, intact (no Ca)  Add-on,   AD        11/23/22 2349            Vitals/Pain Today's Vitals   11/24/22 1130 11/24/22 1300 11/24/22 1400 11/24/22 1507  BP: (!) 162/68 134/70  (!) 155/91  Pulse: 85 78  89  Resp: 20 17  (!) 30  Temp:   98.4 F (36.9 C) 98.2 F (36.8 C)  TempSrc:   Oral Oral  SpO2: 98% 93%  92%  Weight:      Height:      PainSc:  0-No pain 0-No pain     Isolation Precautions No active isolations  Medications Medications  acetaminophen (TYLENOL) tablet 650 mg (650 mg Oral Given 11/24/22 1010)    Or  acetaminophen (TYLENOL) suppository 650 mg ( Rectal See Alternative 11/24/22 1010)  melatonin tablet 3 mg (has no administration in time range)  ondansetron (ZOFRAN) injection 4 mg (has no administration in time range)  naloxone [facility]) injection 0.4 mg (has no administration in time range)  insulin aspart (novoLOG) injection 0-15 Units (8 Units Subcutaneous Given 11/24/22 1400)  pantoprazole (PROTONIX) EC tablet 20 mg (20 mg Oral Given 11/24/22 1010)  rosuvastatin (CRESTOR) tablet 10 mg (10 mg Oral Given 11/24/22 1010)  influenza vaccine adjuvanted (FLUAD) injection 0.5 mL (has no administration in time range)  furosemide (LASIX) injection 60 mg (60 mg Intravenous Given 11/24/22 1455)  insulin glargine-yfgn (SEMGLEE) injection 15 Units (has no administration in time range)  amLODipine (NORVASC) tablet 5 mg (has no administration in time range)  magnesium sulfate IVPB 2 g 50 mL (2 g Intravenous New Bag/Given 11/24/22 1502)  furosemide (LASIX) injection 60 mg (60 mg Intravenous Given 11/23/22 2300)  potassium chloride SA (KLOR-CON M) CR tablet 20 mEq (20 mEq Oral Given 11/24/22 1456)    Mobility walks with person assist     Focused Assessments Cardiac Assessment Handoff:  Cardiac Rhythm: Normal sinus rhythm Lab Results  Component Value Date   CKTOTAL 85 01/22/2022   CKMB (HH) 01/22/2010     17.1 CRITICAL VALUE NOTED.  VALUE IS CONSISTENT WITH PREVIOUSLY REPORTED AND CALLED VALUE.   TROPONINI (HH) 01/22/2010    12.43        POSSIBLE MYOCARDIAL ISCHEMIA. SERIAL TESTING RECOMMENDED. CRITICAL VALUE NOTED.  VALUE IS CONSISTENT WITH PREVIOUSLY REPORTED AND CALLED VALUE.   No results found for: "DDIMER" Does the Patient currently have chest pain? No    R Recommendations: See Admitting Provider Note  Report given to:   Additional Notes:  Able to speak some [place].

## 2022-11-24 NOTE — Plan of Care (Signed)

## 2022-11-24 NOTE — Assessment & Plan Note (Signed)
Continue with pantoprazole/.  

## 2022-11-24 NOTE — Progress Notes (Addendum)
Progress Note   Patient: Colin [name] ZOX:[id number] DOB: Jun 09, 1950 DOA: 11/23/2022     1 DOS: the patient was seen and examined on 11/24/2022   Brief hospital course: Colin [name] was admitted to the hospital with the working diagnosis of heart failure exacerbation.   72 yo male with the past medical history of heart failure, T2DM, hypertension, hyperlipidemia, and CKD who presented with dyspnea. Reported 3 to 4 days of progressive dyspnea, with orthopnea and lower extremity edema. On his initial physical examination his blood pressure was 213/119, RR 18 to 26, HR 80 to 90, with 02 saturation 98%, lungs with with wheezing or rhonchi, heart with S1 and S2 present and regular, abdomen with no distention and positive lower extremity edema.   Na 142, K 3,8 Cl 106 bicarbonate 28, glucose 243 bun 24 cr 2,40 BNP 1,693 High sensitive troponin 87 and 82  Wbc 7,2 hgb 12,8 plt 239   Chest radiograph with cardiomegaly, bilateral hilar vascular congestion with small bilateral pleural effusions. Sternotomy wires in place.   EKG 96 bpm, normal axis, normal intervals, qtc 444, sinus rhythm with q wave V1, no significant ST segment changes, negative T wave lead II, III, and Avf. Positive LVH.   Assessment and Plan: * Acute on chronic systolic CHF (congestive heart failure) (HCC) Echocardiogram with reduced LV systolic function EF 25 to 30%, global hypokinesis, mild asymmetric LVH of the lateral segment. RV systolic function preserved. LA with moderate dilatation, mild to moderate MR, no pericardial effusion.   Patient continue volume overloaded.  Blood pressure systolic 162 to 154 mmHg.  Plan to increase furosemide to 60 mg IV bid to target further negative fluid balance.  Hold on RAAS until renal function more stable Hold on B blocker due to low EF.   Hypertensive crisis Blood pressure better controlled.  Will continue aggressive diuresis with IV furosemide. Continue amlodipine for blood pressure  control. Ok to hold on nitroglycerin drip.   CKD (chronic kidney disease) stage 4, GFR 15-29 ml/min (HCC) Renal function with serum cr at 2,4 with K at 3.5 and serum bicarbonate at 28, Na 143 and Mg 1.8  Will add 20 kcl po and 2 g IV mag to prevent hypokalemia and hypomagnesemia.  Follow up renal function in am.   Type 2 diabetes mellitus with hyperlipidemia (HCC) Continue insulin sliding scale for glucose cover and monitoring  Decrease basal insulin to 15 units daily to prevent hypoglycemia.   Continue with statin therapy .  GERD (gastroesophageal reflux disease) Continue with pantoprazole.     Subjective: Patient is feeling better but not yet back to his baseline, continue to have dyspnea and edema.   Physical Exam: Vitals:   11/24/22 0845 11/24/22 1042 11/24/22 1045 11/24/22 1130  BP: (!) 191/178  (!) 183/81 (!) 162/68  Pulse: 88 86 85 85  Resp: (!) 23 17  20   Temp:  97.8 F (36.6 C)    TempSrc:      SpO2: 95% 95% 98% 98%  Weight:      Height:       Neurology awake and alert ENT with mild pallor Cardiovascular with S1 and S2 present and regular with no gallops, rubs or murmurs Positive JVD Positive lower extremity edema +++ (warm lower extremities) Respiratory with rales at bases with no wheezing  Abdomen with no distention  Data Reviewed:    Family Communication: no family at the bedside. I spoke with patient's son over the phone, we talked in detail about  patient's condition, plan of care and prognosis and all questions were addressed.    Disposition: Status is: Inpatient Remains inpatient appropriate because: IV diuresis   Planned Discharge Destination: Home   Author: Coralie Keens, MD 11/24/2022 2:33 PM  For on call review [email or web address].

## 2022-11-24 NOTE — Assessment & Plan Note (Addendum)
Renal function with serum cr at 2,4 with K at 3.5 and serum bicarbonate at 28, Na 143 and Mg 1.8  Will add 20 kcl po and 2 g IV mag to prevent hypokalemia and hypomagnesemia.  Follow up renal function in am.

## 2022-11-24 NOTE — Progress Notes (Signed)
  Echocardiogram 2D Echocardiogram has been performed.  [name] 11/24/2022, 9:25 AM

## 2022-11-24 NOTE — Assessment & Plan Note (Addendum)
Uncontrolled T2DM with hyper and hypoglycemia.   Continue insulin sliding scale for glucose cover and monitoring  Decrease basal insulin to 15 units daily to prevent hypoglycemia.   Continue with statin therapy .

## 2022-11-24 NOTE — Assessment & Plan Note (Deleted)
Blood pressure better controlled.  Will continue aggressive diuresis with IV furosemide. Continue amlodipine for blood pressure control. Ok to hold on nitroglycerin drip.

## 2022-11-24 NOTE — Hospital Course (Signed)
Mr. Rosendale was admitted to the hospital with the working diagnosis of heart failure exacerbation.   72 yo male with the past medical history of heart failure, T2DM, hypertension, hyperlipidemia, and CKD who presented with dyspnea. Reported 3 to 4 days of progressive dyspnea, with orthopnea and lower extremity edema. On his initial physical examination his blood pressure was 213/119, RR 18 to 26, HR 80 to 90, with 02 saturation 98%, lungs with with wheezing or rhonchi, heart with S1 and S2 present and regular, abdomen with no distention and positive lower extremity edema.   Na 142, K 3,8 Cl 106 bicarbonate 28, glucose 243 bun 24 cr 2,40 BNP 1,693 High sensitive troponin 87 and 82  Wbc 7,2 hgb 12,8 plt 239   Chest radiograph with cardiomegaly, bilateral hilar vascular congestion with small bilateral pleural effusions. Sternotomy wires in place.   EKG 96 bpm, normal axis, normal intervals, qtc 444, sinus rhythm with q wave V1, no significant ST segment changes, negative T wave lead II, III, and Avf. Positive LVH.   Patient was placed on IV furosemide and IV nitroglycerin drip.   10/20 patient continue volume overloaded. Blood pressure improved, now off nitroglycerin drip.  10/21 continue to improve volume status.

## 2022-11-25 DIAGNOSIS — I5023 Acute on chronic systolic (congestive) heart failure: Secondary | ICD-10-CM | POA: Diagnosis not present

## 2022-11-25 DIAGNOSIS — N184 Chronic kidney disease, stage 4 (severe): Secondary | ICD-10-CM | POA: Diagnosis not present

## 2022-11-25 DIAGNOSIS — I10 Essential (primary) hypertension: Secondary | ICD-10-CM | POA: Diagnosis not present

## 2022-11-25 DIAGNOSIS — E1169 Type 2 diabetes mellitus with other specified complication: Secondary | ICD-10-CM | POA: Diagnosis not present

## 2022-11-25 LAB — GLUCOSE, CAPILLARY
Glucose-Capillary: 50 mg/dL — ABNORMAL LOW (ref 70–99)
Glucose-Capillary: 55 mg/dL — ABNORMAL LOW (ref 70–99)
Glucose-Capillary: 225 mg/dL — ABNORMAL HIGH (ref 70–99)
Glucose-Capillary: 130 mg/dL — ABNORMAL HIGH (ref 70–99)
Glucose-Capillary: 241 mg/dL — ABNORMAL HIGH (ref 70–99)
Glucose-Capillary: 296 mg/dL — ABNORMAL HIGH (ref 70–99)

## 2022-11-25 LAB — BASIC METABOLIC PANEL
Anion gap: 8 (ref 5–15)
BUN: 27 mg/dL — ABNORMAL HIGH (ref 8–23)
CO2: 30 mmol/L (ref 22–32)
Calcium: 8.6 mg/dL — ABNORMAL LOW (ref 8.9–10.3)
Chloride: 106 mmol/L (ref 98–111)
Creatinine, Ser: 2.6 mg/dL — ABNORMAL HIGH (ref 0.61–1.24)
GFR, Estimated: 25 mL/min — ABNORMAL LOW (ref >60)
Glucose, Bld: 55 mg/dL — ABNORMAL LOW (ref 70–99)
Potassium: 3.2 mmol/L — ABNORMAL LOW (ref 3.5–5.1)
Sodium: 144 mmol/L (ref 135–145)

## 2022-11-25 LAB — MAGNESIUM: Magnesium: 2.1 mg/dL (ref 1.7–2.4)

## 2022-11-25 LAB — PARATHYROID HORMONE, INTACT (NO CA): PTH: 24 pg/mL (ref 15–65)

## 2022-11-25 MED ORDER — POTASSIUM CHLORIDE CRYS ER 20 MEQ PO TBCR
40.0000 meq | EXTENDED_RELEASE_TABLET | Freq: Once | ORAL | Status: AC
  Administered 2022-11-25: 40 meq via ORAL
  Filled 2022-11-25: qty 2

## 2022-11-25 MED ORDER — ISOSORBIDE MONONITRATE ER 30 MG PO TB24
30.0000 mg | ORAL_TABLET | Freq: Every day | ORAL | Status: DC
  Administered 2022-11-25 – 2022-11-26 (×2): 30 mg via ORAL
  Filled 2022-11-25 (×2): qty 1

## 2022-11-25 MED ORDER — FUROSEMIDE 10 MG/ML IJ SOLN
80.0000 mg | Freq: Two times a day (BID) | INTRAMUSCULAR | Status: DC
  Administered 2022-11-25 – 2022-11-26 (×2): 80 mg via INTRAVENOUS
  Filled 2022-11-25 (×2): qty 8

## 2022-11-25 MED ORDER — DEXTROSE 50 % IV SOLN
INTRAVENOUS | Status: AC
  Administered 2022-11-25: 50 mL
  Filled 2022-11-25: qty 50

## 2022-11-25 MED ORDER — HYDRALAZINE HCL 25 MG PO TABS
25.0000 mg | ORAL_TABLET | Freq: Three times a day (TID) | ORAL | Status: DC
  Administered 2022-11-25 – 2022-11-26 (×5): 25 mg via ORAL
  Filled 2022-11-25 (×5): qty 1

## 2022-11-25 NOTE — Progress Notes (Signed)
Progress Note   Patient: Colin [name]:[id number] DOB: 12/06/1950 DOA: 11/23/2022     2 DOS: the patient was seen and examined on 11/25/2022   Brief hospital course: Colin [name] was admitted to the hospital with the working diagnosis of heart failure exacerbation.   72 yo male with the past medical history of heart failure, T2DM, hypertension, hyperlipidemia, and CKD who presented with dyspnea. Reported 3 to 4 days of progressive dyspnea, with orthopnea and lower extremity edema. On his initial physical examination his blood pressure was 213/119, RR 18 to 26, HR 80 to 90, with 02 saturation 98%, lungs with with wheezing or rhonchi, heart with S1 and S2 present and regular, abdomen with no distention and positive lower extremity edema.   Na 142, K 3,8 Cl 106 bicarbonate 28, glucose 243 bun 24 cr 2,40 BNP 1,693 High sensitive troponin 87 and 82  Wbc 7,2 hgb 12,8 plt 239   Chest radiograph with cardiomegaly, bilateral hilar vascular congestion with small bilateral pleural effusions. Sternotomy wires in place.   EKG 96 bpm, normal axis, normal intervals, qtc 444, sinus rhythm with q wave V1, no significant ST segment changes, negative T wave lead II, III, and Avf. Positive LVH.   Patient was placed on IV furosemide and IV nitroglycerin drip.   10/20 patient continue volume overloaded. Blood pressure improved, now off nitroglycerin drip.   Assessment and Plan: * Acute on chronic systolic CHF (congestive heart failure) (HCC) Echocardiogram with reduced LV systolic function EF 25 to 30%, global hypokinesis, mild asymmetric LVH of the lateral segment. RV systolic function preserved. LA with moderate dilatation, mild to moderate MR, no pericardial effusion.   Urine output is 1,750 ml Systolic blood pressure 140 to 150 mmHg.   Increase furosemide to 80 mg IV bid Add after load reduction with hydralazine and isosorbide.  Hold on RAAS until renal function more stable Holding ivabradine for now.   Add ted hose.    Essential hypertension Blood pressure has improved. Plan to continue aggressive diuresis, and after load reduction with hydralazine and isosorbide.  Hold on amlodipine.   CKD (chronic kidney disease) stage 4, GFR 15-29 ml/min (HCC) Hypokalemia.   Renal function with serum cr at 2,6 with K at 3,2 and serum bicarbonate at 30, Na 144 Mg 2,1   Plan to add 40 kcl po and follow up renal function in am.  Continue diuresis with furosemide, increase dose to 80 mg IV bid.   Type 2 diabetes mellitus with hyperlipidemia (HCC) Continue insulin sliding scale for glucose cover and monitoring  Decrease basal insulin to 15 units daily to prevent hypoglycemia.   Continue with statin therapy .  GERD (gastroesophageal reflux disease) Continue with pantoprazole.        Subjective: Patient is feeling better but not back to baseline, continue to have fatigue and edema   Physical Exam: Vitals:   11/25/22 0700 11/25/22 0715 11/25/22 0720 11/25/22 0740  BP: (!) 176/84 (!) 180/79 (!) 180/79 (!) 158/70  Pulse: 85     Resp: 18     Temp:   97.6 F (36.4 C)   TempSrc:   Axillary   SpO2: 96%     Weight:      Height:       Neurology awake and alert ENT with mild pallor Cardiovascular with S1 and S2 present and regular with no gallops, rubs or murmurs Positive moderate JVD Positive lower extremity edema ++ pitting, warm lower extremities Respiratory with scattered rales with  no wheezing or rhonchi Abdomen with no distention  Data Reviewed:    Family Communication: no family at the bedside   Disposition: Status is: Inpatient Remains inpatient appropriate because: IV diuresis   Planned Discharge Destination: Home     Author: Coralie Keens, MD 11/25/2022 10:13 AM  For on call review [email or web address].

## 2022-11-25 NOTE — Progress Notes (Deleted)
Pt refusing ted hose at this time.  RN educated importance of ted hose. Will try again.

## 2022-11-25 NOTE — Progress Notes (Signed)
Patients bp 180/79 with map of 105. PRN Hydralazine administered. Repeat bp decreased to 158/70 with map of 89.

## 2022-11-25 NOTE — Assessment & Plan Note (Addendum)
Continue afterload reduction with hydralazine and isosorbide.

## 2022-11-26 ENCOUNTER — Other Ambulatory Visit: Payer: Self-pay

## 2022-11-26 DIAGNOSIS — I10 Essential (primary) hypertension: Secondary | ICD-10-CM | POA: Diagnosis not present

## 2022-11-26 DIAGNOSIS — K219 Gastro-esophageal reflux disease without esophagitis: Secondary | ICD-10-CM | POA: Diagnosis not present

## 2022-11-26 DIAGNOSIS — N184 Chronic kidney disease, stage 4 (severe): Secondary | ICD-10-CM | POA: Diagnosis not present

## 2022-11-26 DIAGNOSIS — I5023 Acute on chronic systolic (congestive) heart failure: Secondary | ICD-10-CM | POA: Diagnosis not present

## 2022-11-26 LAB — BASIC METABOLIC PANEL
Anion gap: 9 (ref 5–15)
BUN: 32 mg/dL — ABNORMAL HIGH (ref 8–23)
CO2: 31 mmol/L (ref 22–32)
Calcium: 8.4 mg/dL — ABNORMAL LOW (ref 8.9–10.3)
Chloride: 101 mmol/L (ref 98–111)
Creatinine, Ser: 3.34 mg/dL — ABNORMAL HIGH (ref 0.61–1.24)
GFR, Estimated: 19 mL/min — ABNORMAL LOW (ref >60)
Glucose, Bld: 216 mg/dL — ABNORMAL HIGH (ref 70–99)
Potassium: 3.8 mmol/L (ref 3.5–5.1)
Sodium: 141 mmol/L (ref 135–145)

## 2022-11-26 LAB — LACTIC ACID, PLASMA: Lactic Acid, Venous: 1.8 mmol/L (ref 0.5–1.9)

## 2022-11-26 LAB — GLUCOSE, CAPILLARY
Glucose-Capillary: 132 mg/dL — ABNORMAL HIGH (ref 70–99)
Glucose-Capillary: 295 mg/dL — ABNORMAL HIGH (ref 70–99)
Glucose-Capillary: 208 mg/dL — ABNORMAL HIGH (ref 70–99)
Glucose-Capillary: 219 mg/dL — ABNORMAL HIGH (ref 70–99)

## 2022-11-26 LAB — MAGNESIUM: Magnesium: 2 mg/dL (ref 1.7–2.4)

## 2022-11-26 MED ORDER — POTASSIUM CHLORIDE CRYS ER 20 MEQ PO TBCR
20.0000 meq | EXTENDED_RELEASE_TABLET | Freq: Once | ORAL | Status: AC
  Administered 2022-11-26: 20 meq via ORAL
  Filled 2022-11-26: qty 1

## 2022-11-26 MED ORDER — HYDRALAZINE HCL 50 MG PO TABS
50.0000 mg | ORAL_TABLET | Freq: Three times a day (TID) | ORAL | Status: DC
  Administered 2022-11-26 – 2022-11-27 (×2): 50 mg via ORAL
  Filled 2022-11-26 (×2): qty 1

## 2022-11-26 NOTE — Progress Notes (Addendum)
Progress Note   Patient: Colin [name] QVZ:[id number] DOB: Apr 10, 1950 DOA: 11/23/2022     3 DOS: the patient was seen and examined on 11/26/2022   Brief hospital course: Colin [name] was admitted to the hospital with the working diagnosis of heart failure exacerbation.   72 yo male with the past medical history of heart failure, T2DM, hypertension, hyperlipidemia, and CKD who presented with dyspnea. Reported 3 to 4 days of progressive dyspnea, with orthopnea and lower extremity edema. On his initial physical examination his blood pressure was 213/119, RR 18 to 26, HR 80 to 90, with 02 saturation 98%, lungs with with wheezing or rhonchi, heart with S1 and S2 present and regular, abdomen with no distention and positive lower extremity edema.   Na 142, K 3,8 Cl 106 bicarbonate 28, glucose 243 bun 24 cr 2,40 BNP 1,693 High sensitive troponin 87 and 82  Wbc 7,2 hgb 12,8 plt 239   Chest radiograph with cardiomegaly, bilateral hilar vascular congestion with small bilateral pleural effusions. Sternotomy wires in place.   EKG 96 bpm, normal axis, normal intervals, qtc 444, sinus rhythm with q wave V1, no significant ST segment changes, negative T wave lead II, III, and Avf. Positive LVH.   Patient was placed on IV furosemide and IV nitroglycerin drip.   10/20 patient continue volume overloaded. Blood pressure improved, now off nitroglycerin drip.  10/21 continue to improve volume status.   Assessment and Plan: * Acute on chronic systolic CHF (congestive heart failure) (HCC) Echocardiogram with reduced LV systolic function EF 25 to 30%, global hypokinesis, mild asymmetric LVH of the lateral segment. RV systolic function preserved. LA with moderate dilatation, mild to moderate MR, no pericardial effusion.   Urine output is 2,400 ml Since admission is fluid balance is negative -4.393 Systolic blood pressure 130 to 150 mmHg.   Patient had 80 mg IV furosemide this morning, will hold on PM dose for now.   Continue after load reduction with hydralazine and isosorbide.  Hold on RAAS until renal function more stable Holding ivabradine for now.  Continue with ted hose.    Volume status continue to improve, will consult heart failure team for further optimization of medical therapy.   Essential hypertension Continue afterload reduction with hydralazine and isosorbide.  CKD (chronic kidney disease) stage 4, GFR 15-29 ml/min (HCC) AKI, Hypokalemia.   Renal function with serum cr up to 3,34, K is 3,8 and serum bicarbonate at 31. Na 141. Mg 2.0   Plan to hold PM dose of loop diuretic. Add 20 meq Kcl today Follow up renal function in am.   Type 2 diabetes mellitus with hyperlipidemia (HCC) Uncontrolled T2DM with hyper and hypoglycemia.   Continue insulin sliding scale for glucose cover and monitoring  Decrease basal insulin to 15 units daily to prevent hypoglycemia.   Continue with statin therapy .  GERD (gastroesophageal reflux disease) Continue with pantoprazole.    Subjective: Patient is feeling better, dyspnea has improved, no orthopnea, edema improved as well, continue feeling week and deconditioned   Physical Exam: Vitals:   11/26/22 0300 11/26/22 0400 11/26/22 0500 11/26/22 0700  BP: (!) 157/72 (!) 157/74  (!) 151/82  Pulse: 90 88  85  Resp: 15 14  20   Temp: 98.5 F (36.9 C)   98.5 F (36.9 C)  TempSrc: Oral   Oral  SpO2: 94% 94%  94%  Weight:   63.1 kg   Height:       Neurology awake and alert ENT with  mild pallor Cardiovascular with S1 and S2 present and regular, with no gallops, rubs positive systolic murmur at the apex. Mild JVD Trace lower extremity edema Respiratory with no rales or wheezing Abdomen with no distention  Data Reviewed:    Family Communication: no family at the bedside   Disposition: Status is: Inpatient Remains inpatient appropriate because: heart failure   Planned Discharge Destination: Home     Author: Coralie Keens,  MD 11/26/2022 9:19 AM  For on call review [email or web address].

## 2022-11-26 NOTE — TOC Initial Note (Signed)
Transition of Care [facility]) - Initial/Assessment Note    Patient Details  Name: Colin [name] MRN: [id number] Date of Birth: 15-Oct-1950  Transition of [facility]) CM/SW Contact:    Leone Haven, RN Phone Number: 11/26/2022, 11:26 AM  Clinical Narrative:                 From home with son, has PCP and insurance on file, states has no HH services in place at this time, he has home oxygen 2 liters but not sure of company, walker and a cane.   States son will transport him  home at [employer] and he is support system, states gets medications from [facility]. Marland Kitchen  Pta self ambulatory with walker.  Expected Discharge Plan: Home/Self Care Barriers to Discharge: Continued Medical Work up   Patient Goals and CMS Choice Patient states their goals for this hospitalization and ongoing recovery are:: return home   Choice offered to / list presented to : NA      Expected Discharge Plan and Services In-house Referral: NA Discharge Planning Services: CM Consult Post Acute Care Choice: NA Living arrangements for the past 2 months: Single Family Home                   DME Agency: NA       HH Arranged: NA          Prior Living Arrangements/Services Living arrangements for the past 2 months: Single Family Home Lives with:: Adult Children (son) Patient language and need for interpreter reviewed:: Yes Do you feel safe going back to the place where you live?: Yes      Need for Family Participation in Patient Care: Yes (Comment) Care giver support system in place?: Yes (comment) Current home services: DME (home oxygen 2 liters, walker , cane) Criminal Activity/Legal Involvement Pertinent to Current Situation/Hospitalization: No - Comment as needed  Activities of Daily Living   ADL Screening (condition at time of admission) Independently performs ADLs?: Yes (appropriate for developmental age) Is the patient deaf or have difficulty hearing?: No Does the patient have difficulty seeing, even  when wearing glasses/contacts?: No Does the patient have difficulty concentrating, remembering, or making decisions?: No  Permission Sought/Granted Permission sought to share information with : Case Manager Permission granted to share information with : Yes, Verbal Permission Granted              Emotional Assessment Appearance:: Appears stated age Attitude/Demeanor/Rapport: Engaged Affect (typically observed): Appropriate Orientation: : Oriented to Self, Oriented to Place, Oriented to  Time, Oriented to Situation Alcohol / Substance Use: Not Applicable Psych Involvement: No (comment)  Admission diagnosis:  Acute on chronic systolic heart failure (HCC) [I50.23] Noncompliance w/medication treatment due to intermit use of medication [Z91.148] Congestive heart failure, unspecified HF chronicity, unspecified heart failure type (HCC) [I50.9] Patient Active Problem List   Diagnosis Date Noted   Hypertensive crisis 11/24/2022   Elevated troponin 11/24/2022   Hypercalcemia 11/24/2022   Epigastric pain 11/24/2022   GERD (gastroesophageal reflux disease) 11/24/2022   CKD (chronic kidney disease) stage 4, GFR 15-29 ml/min (HCC) 11/24/2022   Acute on chronic combined systolic and diastolic CHF (congestive heart failure) (HCC) 11/23/2022   Left upper quadrant abdominal pain 05/30/2022   Injury of right shoulder 03/02/2022   Sinus bradycardia 01/22/2022   Near syncope 01/22/2022   Bradycardia 01/22/2022   COPD with acute exacerbation (HCC) 12/21/2021   Acute on chronic systolic CHF (congestive heart failure) (HCC) 12/16/2021  Acute hypoxic respiratory failure (HCC) 12/16/2021   AKI (acute kidney injury) (HCC) 12/16/2021   Protein-calorie malnutrition, severe 08/29/2020   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   Coronary artery disease 08/23/2020   HFrEF (heart failure with reduced ejection fraction) (HCC)    Sinus pause    Acute pulmonary edema (HCC)    Hyperglycemia    NSTEMI (non-ST elevated  myocardial infarction) (HCC)    Shortness of breath    Smoking    Thoracic aorta atherosclerosis (HCC)    Essential hypertension 10/07/2019   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 [place] [address] Phone: [phone] Fax: [phone]  [facility] [place], [place] - [address] [place] [address] Phone: [phone] Fax: [phone]  [facility] [address] [place] [address] Phone: [phone] Fax: [phone]     Social Determinants of Health (SDOH) Social History: SDOH Screenings   Food Insecurity: No Food Insecurity (11/24/2022)  Housing: Low Risk  (11/24/2022)  Transportation Needs: No Transportation Needs (11/24/2022)  Utilities: Not At Risk (11/24/2022)  Depression (PHQ2-9): High Risk (01/11/2022)  Tobacco Use: Medium Risk (11/23/2022)   SDOH Interventions:     Readmission Risk Interventions     No data to display

## 2022-11-26 NOTE — Progress Notes (Signed)
Order for PICC insertion noted. Arrived to gain consent, but interpreter services had no staff available to interpret into his language at this time. VAST to follow up 10-22.

## 2022-11-26 NOTE — Progress Notes (Signed)
Mobility Specialist Progress Note:   11/26/22 1439  Mobility  Activity Ambulated with assistance in hallway  Level of Assistance Standby assist, set-up cues, supervision of patient - no hands on  Assistive Device Front wheel walker  Distance Ambulated (ft) 150 ft  Activity Response Tolerated well  Mobility Referral Yes  $Mobility charge 1 Mobility  Mobility Specialist Start Time (ACUTE ONLY) 1419  Mobility Specialist Stop Time (ACUTE ONLY) 1434  Mobility Specialist Time Calculation (min) (ACUTE ONLY) 15 min    Pre Mobility: 87 HR,  92% SpO2 During Mobility: 98 HR,  97% SpO2 Post Mobility:  93 HR, 96% SpO2  Pt received in bed, agreeable to mobility. Asymptomatic w/ no complaints throughout. Pt left on EOB with call bell and all needs met.    D'Vante Earlene Plater Mobility Specialist Please contact via Special educational needs teacher or Rehab office at [phone]

## 2022-11-26 NOTE — Evaluation (Signed)
Physical Therapy Evaluation Patient Details Name: NATAVION DUNNETT MRN: [id number] DOB: September 04, 1950 Today's Date: 11/26/2022  History of Present Illness  [place] ELYAN MARCHAN is a 72 y.o. male  admitted on 11/23/2022 with c/o of shortness of breath.  PMH: chronic combined systolic/diastolic heart failure, DM II, essential hypertension, hyperlipidemia, GERD, stage IV CKD with baseline creatinine 2.0-2.5   Clinical Impression  Pt admitted with above. Pt presenting with generalized weakness and deconditioning with mild SOB with ambulation. PTA pt was indep with mobility without AD however pt currently benefits from RW to improve stability with ambulation and energy conservation. Anticipate pt will progress well enough to return home without PT follow up post discharge once medically stable. Acute PT to cont to follow while in hospital. Mobility team to be consulted as well.        If plan is discharge home, recommend the following:     Can travel by private vehicle        Equipment Recommendations None recommended by PT (has RW at home)  Recommendations for Other Services       Functional Status Assessment Patient has had a recent decline in their functional status and demonstrates the ability to make significant improvements in function in a reasonable and predictable amount of time.     Precautions / Restrictions Precautions Precautions: Other (comment) Precaution Comments: watch BP Restrictions Weight Bearing Restrictions: No      Mobility  Bed Mobility Overal bed mobility: Modified Independent             General bed mobility comments: HOB slightly elevated, no physical assist, increased time to transfer to EOB, no difficulty getting self back in the bed    Transfers Overall transfer level: Needs assistance Equipment used: None Transfers: Sit to/from Stand Sit to Stand: Contact guard assist           General transfer comment: pt with base of support, CGA for safety,  increased time, pt with poor eccentric control when returning to sit EOB    Ambulation/Gait Ambulation/Gait assistance: Contact guard assist Gait Distance (Feet): 100 Feet Assistive device: Rolling walker (2 wheels) Gait Pattern/deviations: Step-through pattern, Decreased stride length, Wide base of support Gait velocity: dec Gait velocity interpretation: 1.31 - 2.62 ft/sec, indicative of limited community ambulator   General Gait Details: pt initially ambulated without AD however pt with short step length near step too pattern and wide base of support, pt reached for RW and demo'd improved fluidity of gait pattern and cadence. mild SOB, VSS  Stairs            Wheelchair Mobility     Tilt Bed    Modified Rankin (Stroke Patients Only)       Balance Overall balance assessment: Mild deficits observed, not formally tested                                           Pertinent Vitals/Pain Pain Assessment Pain Assessment: No/denies pain    Home Living Family/patient expects to be discharged to:: Private residence Living Arrangements: Spouse/significant other;Other relatives (son lives upstairs) Available Help at Discharge: Family;Available PRN/intermittently Type of Home: House Home Access: Stairs to enter Entrance Stairs-Rails: None Entrance Stairs-Number of Steps: 2 Alternate Level Stairs-Number of Steps: flight Home Layout: Multi-level;Able to live on main level with bedroom/bathroom Home Equipment: Rolling Walker (2 wheels);Shower seat;Grab bars - tub/shower  Prior Function Prior Level of Function : Independent/Modified Independent;Driving             Mobility Comments: pt denies falls, reports use of RW or cane or no DME depending on day ADLs Comments: pt reports independence     Extremity/Trunk Assessment   Upper Extremity Assessment Upper Extremity Assessment: Overall WFL for tasks assessed    Lower Extremity Assessment Lower  Extremity Assessment: Overall WFL for tasks assessed (noted edema t/o bilat LEs, pt with compression socks on)    Cervical / Trunk Assessment Cervical / Trunk Assessment: Normal  Communication      Cognition Arousal: Alert Behavior During Therapy: WFL for tasks assessed/performed Overall Cognitive Status: Within Functional Limits for tasks assessed                                 General Comments: pt speaks montagnard dega but was able to speak enough english to get through PT eval        General Comments General comments (skin integrity, edema, etc.): bilat LE edema, compression socks on    Exercises     Assessment/Plan    PT Assessment Patient needs continued PT services  PT Problem List Decreased strength;Decreased activity tolerance;Decreased balance;Decreased mobility       PT Treatment Interventions DME instruction;Gait training;Stair training;Functional mobility training;Therapeutic activities;Therapeutic exercise;Balance training    PT Goals (Current goals can be found in the Care Plan section)  Acute Rehab PT Goals Patient Stated Goal: home PT Goal Formulation: With patient Time For Goal Achievement: 12/10/22 Potential to Achieve Goals: Good    Frequency Min 1X/week     Co-evaluation               AM-PAC PT "6 Clicks" Mobility  Outcome Measure Help needed turning from your back to your side while in a flat bed without using bedrails?: None Help needed moving from lying on your back to sitting on the side of a flat bed without using bedrails?: None Help needed moving to and from a bed to a chair (including a wheelchair)?: None Help needed standing up from a chair using your arms (e.g., wheelchair or bedside chair)?: A Little Help needed to walk in hospital room?: A Little Help needed climbing 3-5 steps with a railing? : A Little 6 Click Score: 21    End of Session Equipment Utilized During Treatment: Gait belt Activity Tolerance:  Patient tolerated treatment well Patient left: in bed;with call bell/phone within reach (lab tech) Nurse Communication: Mobility status PT Visit Diagnosis: Unsteadiness on feet (R26.81)    Time: 8119-1478 PT Time Calculation (min) (ACUTE ONLY): 20 min   Charges:   PT Evaluation $PT Eval Low Complexity: 1 Low   PT General Charges $$ ACUTE PT VISIT: 1 Visit         Lewis Shock, PT, DPT Acute Rehabilitation Services Secure chat preferred Office #: [phone]   Iona Hansen 11/26/2022, 2:17 PM

## 2022-11-26 NOTE — Consult Note (Signed)
Advanced Heart Failure Team Consult Note   Primary Physician: Grayce Sessions, NP PCP-Cardiologist:  None  Reason for Consultation: Acute on chronic systolic heart failure  HPI:   Colin [name] is seen today for evaluation of acute on chronic systolic heart failure at the request of Dr. Ella Jubilee, hospital medicine.  Colin [name] is a 72 y.o. [name] speaking male with iCM, CAD with prior MI, LCX and RCA stents in 2012, s/p CABG X 3. LIMA LAD, RSVG PDA, OM1 (08/23/2020), chronic systolic heart failure, DMII, HTN, HLD, CKD stage IV (baseline SCr 2-2.5), tobacco use and GERD.  [name] presented to the ED 10/18 with SOB, orthopnea and BLE edema. Denies CP. Reports minimal PO intake, maybe eats once a day, he just doesn't have much of an appetite. Reports taking no oral medications, he only takes his insulin. Does not follow with PCP, he only sees his endocrinologist and last saw cardiology in April. He is very weak at baseline, at home he is only able to walk about 12 feet before he feels tired and his legs give out. He stopped smoking in 22 after his CABG. Denies drinking and illicit drug use.   Labs on admission: SCr 2.4, K 3.8, AST 25, ALT 15, BNP 1693.7, HsTrop 87>82, CT chest with volume overload, mild pulmonary edema. CXR with pleural effusions. BP 213/119. BNP 1700. Echo 10/24: EF 25-30%, GIDD, normal RV, LA mild-mod enlarged, large pleural effusion seen in L lateral region, mild-mod MR, no TR. Since being admitted he has diuresed about 10 lbs. Symptoms have improved. Denies CP. Issue now is his renal function bumped to 3.34 and he's still at least mildly volume overloaded. AHF consulted for GDMT and further diuresis.   Resting comfortably in bed. Interpretor not available (attempted interpretor on wheels and interpretor phone line), patient was able to provide most history. Denies CP/SOB. Laying flat on assessment.   Cardiac studies reviewed:  Echo 10/24: EF 25-30%, GIDD, normal RV,  LA mild-mod enlarged, large pleural effusion seen in L lateral region, mild-mod MR, no TR.  RHC 11/23: RA 5, PA 51/11 (27), PCW 12, CO 3.8, CI 2.2.  TEE 7/22: EF 20-30%, mild concentric LVH, RV normal L/RHC 7/22: LAD: Prox-mid long 80% stenosis, Lcx: Lcx/OM1 95% ISR, RCA: Prox 80% ISR, followed by In-stent CTO mid-distal vessel. Mildly decompensated ischemic cardiomyopathy. Prox RCA to Dist RCA lesion is 100% stenosed. The lesion was previously treated using a drug eluting stent over 2 years ago. Previously placed stent displays restenosis. RA 1, PA 34/12, PCW 14, CO 5.9, CI 3.7 Echo 7/22: EF 30-35%, GIIDD  Home Medications Prior to Admission medications   Medication Sig Start Date End Date Taking? Authorizing Provider  amLODipine (NORVASC) 5 MG tablet Take 1 tablet (5 mg total) by mouth daily. 02/14/22 02/09/23  Patwardhan, Anabel Bene, MD  clopidogrel (PLAVIX) 75 MG tablet Take 1 tablet (75 mg total) by mouth daily. 02/07/22   Custovic, Rozell Searing, DO  Continuous Glucose Receiver (FREESTYLE LIBRE 3 READER) DEVI Use as directed! 02/06/22   Reather Littler, MD  Continuous Glucose Sensor (FREESTYLE LIBRE 3 SENSOR) MISC Use as instructed change every 14 days 08/01/22   Altamese [place], MD  empagliflozin (JARDIANCE) 25 MG TABS tablet Take 1 tablet (25 mg total) by mouth daily. 08/01/22   Altamese [place], MD  furosemide (LASIX) 20 MG tablet Take 60 mg by mouth 2 (two) times daily.    [provider]  furosemide (LASIX) 20 MG tablet TAKE  3 TABLETS BY MOUTH TWICE A DAY 02/14/22   Patwardhan, Manish J, MD  insulin isophane & regular human KwikPen (HUMULIN 70/30 KWIKPEN) (70-30) 100 UNIT/ML KwikPen Inject 40 Units into the skin 3 (three) times daily. 08/01/22   Motwani, Carin Hock, MD  Insulin Pen Needle (PEN NEEDLES 3/16") 31G X 5 MM MISC Uses three times daily with insulin. 05/24/22   Shamleffer, Konrad Dolores, MD  isosorbide mononitrate (IMDUR) 30 MG 24 hr tablet Take 1 tablet (30 mg total) by mouth daily. 02/14/22 02/09/23   Patwardhan, Anabel Bene, MD  ivabradine (CORLANOR) 5 MG TABS tablet Take 1 tablet (5 mg total) by mouth 2 (two) times daily with a meal. Patient not taking: Reported on 08/01/2022 01/12/22   Hoy Register, MD  metoprolol succinate (TOPROL-XL) 50 MG 24 hr tablet Take 1 tablet (50 mg total) by mouth daily. Take with or immediately following a meal. 02/14/22 02/09/23  Patwardhan, Anabel Bene, MD  nitroGLYCERIN (NITROSTAT) 0.4 MG SL tablet Place 1 tab (0.4mg  total) under the tongue every 5 minutes as needed for chest pain 02/14/22   Patwardhan, Manish J, MD  pantoprazole (PROTONIX) 20 MG tablet Take 1 tablet (20 mg total) by mouth daily. 02/07/22   Custovic, Rozell Searing, DO  rosuvastatin (CRESTOR) 10 MG tablet Take 1 tablet (10 mg total) by mouth daily. 06/20/22   Altamese [place], MD    Past Medical History: Past Medical History:  Diagnosis Date   Arrhythmia    CAD (coronary artery disease)    stent x 2   Carotid artery occlusion    Chronic combined systolic and diastolic heart failure (HCC)    Diabetes mellitus    Dyslipidemia    Essential hypertension     Past Surgical History: Past Surgical History:  Procedure Laterality Date   CARDIAC CATHETERIZATION     01/2010 ; 04/2010   CORONARY ARTERY BYPASS GRAFT N/A 08/23/2020   Procedure: CORONARY ARTERY BYPASS GRAFTING (CABG) x 3 ON CARDIOPULMONARY BYPASS USING EVH AND PARTIALLY OPEN HARVESTED LEFT GREATER SAPHENOUS VEIN AND LEFT INTERNAL MAMMARY ARTERY.  LIMA TO LAD, SVG TO OM, SVG TO PDA;  Surgeon: Corliss Skains, MD;  Location: [facility] OR;  Service: Open Heart Surgery;  Laterality: N/A;   RIGHT HEART CATH N/A 12/19/2021   Procedure: RIGHT HEART CATH;  Surgeon: [name], MD;  Location: [facility] INVASIVE CV LAB;  Service: Cardiovascular;  Laterality: N/A;   RIGHT/LEFT HEART CATH AND CORONARY ANGIOGRAPHY N/A 08/22/2020   Procedure: RIGHT/LEFT HEART CATH AND CORONARY ANGIOGRAPHY;  Surgeon: [name], MD;  Location: [facility] INVASIVE CV LAB;  Service:  Cardiovascular;  Laterality: N/A;   TEE WITHOUT CARDIOVERSION N/A 08/23/2020   Procedure: TRANSESOPHAGEAL ECHOCARDIOGRAM (TEE);  Surgeon: Corliss Skains, MD;  Location: [facility] OR;  Service: Open Heart Surgery;  Laterality: N/A;   TEMPORARY PACEMAKER N/A 08/22/2020   Procedure: TEMPORARY PACEMAKER;  Surgeon: [name], MD;  Location: [facility] INVASIVE CV LAB;  Service: Cardiovascular;  Laterality: N/A;   VEIN HARVEST Left 08/23/2020   Procedure: ENDOVEIN HARVEST AND PARTIALLY OPEN GREATER SAPHENOUS VEIN HARVESTING;  Surgeon: Corliss Skains, MD;  Location: [facility] OR;  Service: Open Heart Surgery;  Laterality: Left;    Family History: Family History  Problem Relation Age of Onset   Diabetes Neg Hx     Social History: Social History   Socioeconomic History   Marital status: Married    Spouse name: Not on file   Number of children: Not on file   Years of  education: Not on file   Highest education level: Not on file  Occupational History   Not on file  Tobacco Use   Smoking status: Former    Current packs/day: 0.00    Average packs/day: 0.3 packs/day for 50.0 years (12.5 ttl pk-yrs)    Types: Cigarettes    Start date: 08/1970    Quit date: 08/2020    Years since quitting: 2.3   Smokeless tobacco: Never  Vaping Use   Vaping status: Never Used  Substance and Sexual Activity   Alcohol use: No   Drug use: No   Sexual activity: Not on file  Other Topics Concern   Not on file  Social History Narrative   Not on file   Social Determinants of Health   Financial Resource Strain: Not on file  Food Insecurity: No Food Insecurity (11/24/2022)   Hunger Vital Sign    Worried About Running Out of Food in the Last Year: Never true    Ran Out of Food in the Last Year: Never true  Transportation Needs: No Transportation Needs (11/24/2022)   PRAPARE - Administrator, Civil Service (Medical): No    Lack of Transportation (Non-Medical): No  Physical Activity: Not on file   Stress: Not on file  Social Connections: Not on file    Allergies:  No Known Allergies  Objective:    Vital Signs:   Temp:  [97.8 F (36.6 C)-98.7 F (37.1 C)] 98.7 F (37.1 C) (10/21 1140) Pulse Rate:  [82-90] 85 (10/21 1140) Resp:  [12-20] 19 (10/21 1140) BP: (126-177)/(65-82) 126/68 (10/21 1140) SpO2:  [93 %-96 %] 95 % (10/21 1140) Weight:  [63.1 kg] 63.1 kg (10/21 0500) Last BM Date : 11/24/22  Weight change: Filed Weights   11/24/22 0817 11/25/22 0345 11/26/22 0500  Weight: 63.5 kg 67.4 kg 63.1 kg    Intake/Output:   Intake/Output Summary (Last 24 hours) at 11/26/2022 1229 Last data filed at 11/26/2022 0945 Gross per 24 hour  Intake 240 ml  Output 2150 ml  Net -1910 ml    Physical Exam    General:  well appearing.  No respiratory difficulty HEENT: normal Neck: supple. JVD ~ 10 cm. Carotids 2+ bilat; no bruits. No lymphadenopathy or thyromegaly appreciated. Cor: PMI nondisplaced. Regular rate & rhythm. No rubs, gallops or murmurs. Lungs: clear Abdomen: soft, nontender, nondistended. No hepatosplenomegaly. No bruits or masses. Good bowel sounds. Extremities: no cyanosis, clubbing, rash, +1 BLE edema. + TED hose Neuro: alert & oriented x 3, cranial nerves grossly intact. moves all 4 extremities w/o difficulty. Affect pleasant.   Telemetry   NSR 80s (Personally reviewed)    EKG    NSR PVCs  Labs   Basic Metabolic Panel: Recent Labs  Lab 11/23/22 1944 11/23/22 2145 11/24/22 0522 11/25/22 0202 11/26/22 0214  NA 142  --  143 144 141  K 3.8  --  3.5 3.2* 3.8  CL 106  --  103 106 101  CO2 28  --  26 30 31   GLUCOSE 243*  --  223* 55* 216*  BUN 24*  --  23 27* 32*  CREATININE 2.40*  --  2.40* 2.60* 3.34*  CALCIUM 9.0  --  9.6 8.6* 8.4*  MG  --  2.0 1.8 2.1 2.0  PHOS  --   --  3.5  --   --     Liver Function Tests: Recent Labs  Lab 11/23/22 2145 11/24/22 0522  AST 25 18  ALT 15  12  ALKPHOS 55 56  BILITOT 0.5 0.7  PROT 5.3* 4.1*   ALBUMIN 1.6* <1.5*   Recent Labs  Lab 11/23/22 1944  LIPASE 25   No results for input(s): "AMMONIA" in the last 168 hours.  CBC: Recent Labs  Lab 11/23/22 1944 11/24/22 0522  WBC 7.2 5.5  NEUTROABS  --  3.5  HGB 12.8* 11.0*  HCT 40.0 33.4*  MCV 93.9 90.5  PLT 239 212    Cardiac Enzymes: No results for input(s): "CKTOTAL", "CKMB", "CKMBINDEX", "TROPONINI" in the last 168 hours.  BNP: BNP (last 3 results) Recent Labs    01/22/22 1121 02/27/22 1605 11/23/22 2145  BNP 371.5* 286.3* 1,693.7*    ProBNP (last 3 results) No results for input(s): "PROBNP" in the last 8760 hours.   CBG: Recent Labs  Lab 11/25/22 1105 11/25/22 1658 11/25/22 2130 11/26/22 0609 11/26/22 1142  GLUCAP 130* 241* 296* 132* 295*    Coagulation Studies: No results for input(s): "LABPROT", "INR" in the last 72 hours.   Imaging   No results found.   Medications:     Current Medications:  hydrALAZINE  25 mg Oral Q8H   influenza vaccine adjuvanted  0.5 mL Intramuscular Tomorrow-1000   insulin aspart  0-15 Units Subcutaneous TID WC   insulin glargine-yfgn  15 Units Subcutaneous QHS   isosorbide mononitrate  30 mg Oral Daily   pantoprazole  20 mg Oral Daily   rosuvastatin  10 mg Oral Daily    Infusions:     Patient Profile   Colin [name] is a 72 y.o. male with CAD, prior MI with DES to RCA and L circ, chronic systolic heart failure, DMII, HTN, HLD, CKD stage IV (baseline SCr 2-2.5), tobacco use and GERD. AHF team to see for A/C HFrEF.   Assessment/Plan   Acute on chronic systolic heart failure, iCM - Echo 10/24: EF 25-30%, GIDD, normal RV, LA mild-mod enlarged, large pleural effusion seen in L lateral region, mild-mod MR, no TR.  - RHC 11/23: RA 5, PA 51/11 (27), PCW 12, CO 3.8, CI 2.2.  - NYHA IV on admission, volume remains slightly elevated - Got 80 IV lasix earlier today, will give another 80 this afternoon.  - Suspect he may be low-output. Would place PICC and  trend co-ox/CVP. Will discuss with MD - may need RHC - check lactic acid for now, may need inotropic support - GDMT limited by CKD - Strict I&O, daily weights, continue ted hose  CAD s/p CABG - LHC 7/22: LAD: Prox-mid long 80% stenosis, Lcx: Lcx/OM1 95% ISR, RCA: Prox 80% ISR, followed by In-stent CTO mid-distal vessel. Mildly decompensated ischemic cardiomyopathy. Prox RCA to Dist RCA lesion is 100% stenosed. The lesion was previously treated using a drug eluting stent over 2 years ago. Previously placed stent displays restenosis. - Denies CP - Continue statin, will need referral to lipid clinic at discharge  Hypertension - SBP in 200s on admission, denies taking PO meds at home - Now on hydralazine 25 mg Q8 and Imdur 30 mg daily  AKI on CKD stage 4 - SCr baseline 2-2.5 - SCr today 3.34 - avoid hypotension - suspect low output/cardiorenal   DMII - A1c 10.8 10/24 - SSI per primary  HLD - LDL 214 5/24 - update lipid panel - goal <55 - Continue statin, consider increasing when renal function improves - Refer to lipid clinic at discharge  Tobacco use - quit smoking in 2020   Length of Stay: 3  Dakoda Laventure  Merlene Morse, NP  11/26/2022, 12:29 PM  Advanced Heart Failure Team Pager [phone] (M-F; 7a - 5p)  Please contact [facility] for night-coverage after hours (4p -7a ) and weekends on [email or web address]

## 2022-11-26 NOTE — Care Management Important Message (Signed)
Important Message  Patient Details  Name: Colin [name] MRN: [id number] Date of Birth: Apr 27, 1950   Important Message Given:  Yes - Medicare IM     Dorena Bodo 11/26/2022, 1:44 PM

## 2022-11-26 NOTE — Progress Notes (Signed)
Heart Failure Navigator Progress Note  Assessed for Heart & Vascular TOC clinic readiness.  Patient does not meet criteria due to Advanced Heart Failure Team Consult per Dr. Ella Jubilee.   Navigator will sign off at this time.   Roxy Horseman, RN, BSN [facility] Heart Failure Navigator Secure Chat Only

## 2022-11-26 NOTE — Plan of Care (Signed)

## 2022-11-26 NOTE — Inpatient Diabetes Management (Addendum)
Inpatient Diabetes Program Recommendations  AACE/ADA: New Consensus Statement on Inpatient Glycemic Control (2015)  Target Ranges:  Prepandial:   less than 140 mg/dL      Peak postprandial:   less than 180 mg/dL (1-2 hours)      Critically ill patients:  140 - 180 mg/dL   Lab Results  Component Value Date   GLUCAP 132 (H) 11/26/2022   HGBA1C 10.8 (H) 11/24/2022    Review of Glycemic Control  Latest Reference Range & Units 11/25/22 11:05 11/25/22 16:58 11/25/22 21:30 11/26/22 06:09  Glucose-Capillary 70 - 99 mg/dL 147 (H) 829 (H) 562 (H) 132 (H)   Diabetes history: DM  Outpatient Diabetes medications:  Humulin 70/30 40 units bid Jardiance 25 mg daily FSL CGM Current orders for Inpatient glycemic control:  Novolog 0-15 units tid with meals  Semglee 15 units q HS   Inpatient Diabetes Program Recommendations:    Please consider reducing Novolog correction to sensitive (0-9 units).   It appears he may need meal coverage when eating >50%.  Consider adding Novolog 2-3 units tid with meals.   Patient was supposed to be taking fairly large dose of 70/30 prior to admit.  ? If he was taking this consistently at home.  A1C is better however need to make sure patient was not having lows. Will follow.  Thanks,  Beryl Meager, RN, BC-ADM Inpatient Diabetes Coordinator Pager [phone]  (8a-5p)

## 2022-11-27 DIAGNOSIS — I10 Essential (primary) hypertension: Secondary | ICD-10-CM | POA: Diagnosis not present

## 2022-11-27 DIAGNOSIS — N184 Chronic kidney disease, stage 4 (severe): Secondary | ICD-10-CM | POA: Diagnosis not present

## 2022-11-27 DIAGNOSIS — E1169 Type 2 diabetes mellitus with other specified complication: Secondary | ICD-10-CM | POA: Diagnosis not present

## 2022-11-27 DIAGNOSIS — I5023 Acute on chronic systolic (congestive) heart failure: Secondary | ICD-10-CM | POA: Diagnosis not present

## 2022-11-27 LAB — LIPID PANEL
Cholesterol: 384 mg/dL — ABNORMAL HIGH (ref 0–200)
HDL: 44 mg/dL (ref >40)
LDL Cholesterol: 323 mg/dL — ABNORMAL HIGH (ref 0–99)
Total CHOL/HDL Ratio: 8.7 {ratio}
Triglycerides: 86 mg/dL (ref <150)
VLDL: 17 mg/dL (ref 0–40)

## 2022-11-27 LAB — GLUCOSE, CAPILLARY
Glucose-Capillary: 125 mg/dL — ABNORMAL HIGH (ref 70–99)
Glucose-Capillary: 250 mg/dL — ABNORMAL HIGH (ref 70–99)
Glucose-Capillary: 203 mg/dL — ABNORMAL HIGH (ref 70–99)
Glucose-Capillary: 222 mg/dL — ABNORMAL HIGH (ref 70–99)

## 2022-11-27 LAB — BASIC METABOLIC PANEL
Anion gap: 9 (ref 5–15)
BUN: 34 mg/dL — ABNORMAL HIGH (ref 8–23)
CO2: 31 mmol/L (ref 22–32)
Calcium: 7.8 mg/dL — ABNORMAL LOW (ref 8.9–10.3)
Chloride: 102 mmol/L (ref 98–111)
Creatinine, Ser: 3.09 mg/dL — ABNORMAL HIGH (ref 0.61–1.24)
GFR, Estimated: 21 mL/min — ABNORMAL LOW (ref >60)
Glucose, Bld: 161 mg/dL — ABNORMAL HIGH (ref 70–99)
Potassium: 3.6 mmol/L (ref 3.5–5.1)
Sodium: 142 mmol/L (ref 135–145)

## 2022-11-27 LAB — COOXEMETRY PANEL
Carboxyhemoglobin: 2.1 % — ABNORMAL HIGH (ref 0.5–1.5)
Methemoglobin: <0.7 % (ref 0.0–1.5)
O2 Saturation: 72.7 %
Total hemoglobin: 12.1 g/dL (ref 12.0–16.0)

## 2022-11-27 LAB — MAGNESIUM: Magnesium: 1.9 mg/dL (ref 1.7–2.4)

## 2022-11-27 MED ORDER — MAGNESIUM SULFATE 2 GM/50ML IV SOLN
2.0000 g | Freq: Once | INTRAVENOUS | Status: AC
Start: 2022-11-27 — End: 2022-11-27
  Administered 2022-11-27: 2 g via INTRAVENOUS
  Filled 2022-11-27: qty 50

## 2022-11-27 MED ORDER — FUROSEMIDE 10 MG/ML IJ SOLN
80.0000 mg | Freq: Once | INTRAMUSCULAR | Status: AC
  Administered 2022-11-27: 80 mg via INTRAVENOUS
  Filled 2022-11-27: qty 8

## 2022-11-27 MED ORDER — SODIUM CHLORIDE 0.9% FLUSH
10.0000 mL | Freq: Two times a day (BID) | INTRAVENOUS | Status: DC
  Administered 2022-11-27 – 2022-11-28 (×3): 10 mL

## 2022-11-27 MED ORDER — ISOSORBIDE MONONITRATE ER 60 MG PO TB24
60.0000 mg | ORAL_TABLET | Freq: Every day | ORAL | Status: DC
  Administered 2022-11-27 – 2022-11-28 (×2): 60 mg via ORAL
  Filled 2022-11-27 (×2): qty 1

## 2022-11-27 MED ORDER — POTASSIUM CHLORIDE CRYS ER 20 MEQ PO TBCR
40.0000 meq | EXTENDED_RELEASE_TABLET | Freq: Once | ORAL | Status: AC
  Administered 2022-11-27: 40 meq via ORAL
  Filled 2022-11-27: qty 2

## 2022-11-27 MED ORDER — SODIUM CHLORIDE 0.9% FLUSH: 10.0000 mL | INTRAVENOUS | Status: DC | PRN

## 2022-11-27 MED ORDER — CHLORHEXIDINE GLUCONATE CLOTH 2 % EX PADS
6.0000 | MEDICATED_PAD | Freq: Every day | CUTANEOUS | Status: DC
  Administered 2022-11-27 – 2022-11-28 (×2): 6 via TOPICAL

## 2022-11-27 MED ORDER — HYDRALAZINE HCL 50 MG PO TABS
75.0000 mg | ORAL_TABLET | Freq: Three times a day (TID) | ORAL | Status: DC
  Administered 2022-11-27 – 2022-11-28 (×3): 75 mg via ORAL
  Filled 2022-11-27 (×3): qty 1

## 2022-11-27 MED ORDER — ATORVASTATIN CALCIUM 80 MG PO TABS
80.0000 mg | ORAL_TABLET | Freq: Every day | ORAL | Status: DC
  Administered 2022-11-27 – 2022-11-28 (×2): 80 mg via ORAL
  Filled 2022-11-27 (×2): qty 1

## 2022-11-27 MED ORDER — FUROSEMIDE 10 MG/ML IJ SOLN: 80.0000 mg | Freq: Once | INTRAMUSCULAR | Status: DC

## 2022-11-27 NOTE — Evaluation (Signed)
Occupational Therapy Evaluation Patient Details Name: Colin [name] MRN: [id number] DOB: 10-20-1950 Today's Date: 11/27/2022   History of Present Illness Colin [name] is a 72 y.o. male  admitted on 11/23/2022 with c/o of shortness of breath.  PMH: chronic combined systolic/diastolic heart failure, DM II, essential hypertension, hyperlipidemia, GERD, stage IV CKD with baseline creatinine 2.0-2.5   Clinical Impression   PTA, pt lived with his wife and son and was mod I for BADL. Upon eval, pt pleasant and eager to mobilize and converse with OT. Performing LB ADL with up to CGA secondary to mildly decreased balance. Pt benefiting from min cues for safety with transfers with RW but with good safety awareness with mobility into hall. Will continue to follow but do not suspect need for follow up therapy at discharge.       If plan is discharge home, recommend the following: A little help with walking and/or transfers;A little help with bathing/dressing/bathroom;Assist for transportation;Help with stairs or ramp for entrance    Functional Status Assessment  Patient has had a recent decline in their functional status and demonstrates the ability to make significant improvements in function in a reasonable and predictable amount of time.  Equipment Recommendations  None recommended by OT    Recommendations for Other Services       Precautions / Restrictions Precautions Precautions: Other (comment) Precaution Comments: watch BP Restrictions Weight Bearing Restrictions: No      Mobility Bed Mobility Overal bed mobility: Modified Independent                  Transfers Overall transfer level: Needs assistance Equipment used: Rolling walker (2 wheels) Transfers: Sit to/from Stand Sit to Stand: Contact guard assist           General transfer comment: cues for hand placement      Balance Overall balance assessment: Mild deficits observed, not formally tested                                          ADL either performed or assessed with clinical judgement   ADL Overall ADL's : Needs assistance/impaired Eating/Feeding: Independent;Sitting   Grooming: Contact guard assist;Standing   Upper Body Bathing: Set up;Sitting   Lower Body Bathing: Sit to/from stand;Contact guard assist   Upper Body Dressing : Set up;Sitting   Lower Body Dressing: Contact guard assist;Sit to/from stand   Toilet Transfer: Contact guard assist;Rolling walker (2 wheels);Ambulation Toilet Transfer Details (indicate cue type and reason): simulated in room         Functional mobility during ADLs: Contact guard assist;Rolling walker (2 wheels)       Vision Baseline Vision/History: 1 Wears glasses Ability to See in Adequate Light: 0 Adequate Patient Visual Report: No change from baseline Vision Assessment?: No apparent visual deficits Additional Comments: using phone functionally. Phone noted to be set-up in english? `     Perception Perception: Not tested       Praxis Praxis: Not tested       Pertinent Vitals/Pain Pain Assessment Pain Assessment: No/denies pain     Extremity/Trunk Assessment Upper Extremity Assessment Upper Extremity Assessment: Overall WFL for tasks assessed   Lower Extremity Assessment Lower Extremity Assessment: Defer to PT evaluation   Cervical / Trunk Assessment Cervical / Trunk Assessment: Normal   Communication Communication Communication: Other (comment) (prefers Elissa Lovett (phone number for interpreter on  board))   Cognition Arousal: Alert Behavior During Therapy: WFL for tasks assessed/performed Overall Cognitive Status: Within Functional Limits for tasks assessed                                 General Comments: Pt following all one step commands, pleasant and conversational throughout.     General Comments       Exercises     Shoulder Instructions      Home Living Family/patient  expects to be discharged to:: Private residence Living Arrangements: Spouse/significant other;Other relatives (son lives upstairs) Available Help at Discharge: Family;Available PRN/intermittently Type of Home: House Home Access: Stairs to enter [employer] of Steps: 2 Entrance Stairs-Rails: None Home Layout: Multi-level;Able to live on main level with bedroom/bathroom Alternate Level Stairs-Number of Steps: flight   Bathroom Shower/Tub: Producer, television/film/video: Standard     Home Equipment: Agricultural consultant (2 wheels);Shower seat;Grab bars - tub/shower          Prior Functioning/Environment Prior Level of Function : Independent/Modified Independent;Driving             Mobility Comments: pt denies falls, reports use of RW or cane or no DME depending on day ADLs Comments: pt reports independence        OT Problem List: Decreased strength;Decreased activity tolerance;Impaired balance (sitting and/or standing);Cardiopulmonary status limiting activity;Decreased knowledge of use of DME or AE      OT Treatment/Interventions: Self-care/ADL training;Therapeutic exercise;DME and/or AE instruction;Balance training;Patient/family education;Therapeutic activities    OT Goals(Current goals can be found in the care plan section) Acute Rehab OT Goals Patient Stated Goal: get home OT Goal Formulation: With patient Time For Goal Achievement: 12/11/22 Potential to Achieve Goals: Good  OT Frequency: Min 1X/week    Co-evaluation              AM-PAC OT "6 Clicks" Daily Activity     Outcome Measure Help from another person eating meals?: None Help from another person taking care of personal grooming?: A Little Help from another person toileting, which includes using toliet, bedpan, or urinal?: A Little Help from another person bathing (including washing, rinsing, drying)?: A Little Help from another person to put on and taking off regular upper body clothing?:  None Help from another person to put on and taking off regular lower body clothing?: A Little 6 Click Score: 20   End of Session Equipment Utilized During Treatment: Rolling walker (2 wheels) Nurse Communication: Mobility status  Activity Tolerance: Patient tolerated treatment well Patient left: in bed;with call bell/phone within reach (diabetes coordinator in room)  OT Visit Diagnosis: Unsteadiness on feet (R26.81);Muscle weakness (generalized) (M62.81)                Time: 3664-4034 OT Time Calculation (min): 29 min Charges:  OT General Charges $OT Visit: 1 Visit OT Evaluation $OT Eval Moderate Complexity: 1 Mod OT Treatments $Self Care/Home Management : 8-22 mins  Tyler Deis, OTR/L [facility] Acute Rehabilitation Office: [phone]   Myrla Halsted 11/27/2022, 3:37 PM

## 2022-11-27 NOTE — Progress Notes (Addendum)
Advanced Heart Failure Rounding Note  PCP-Cardiologist: None   Subjective:    Diuresed with IV lasix. Weight down 1lb. Only 1.1L UOP.   Plan for PICC placement today. Able to consent this morning with PICC team and interpretor.   Objective:   Weight Range: 63 kg Body mass index is 24.6 kg/m.   Vital Signs:   Temp:  [97.7 F (36.5 C)-98.8 F (37.1 C)] 97.7 F (36.5 C) (10/22 0810) Pulse Rate:  [75-87] 85 (10/22 0810) Resp:  [14-20] 14 (10/22 0810) BP: (116-170)/(51-90) 116/51 (10/22 0810) SpO2:  [93 %-98 %] 94 % (10/22 0810) Weight:  [63 kg] 63 kg (10/22 0403) Last BM Date : 11/26/22  Weight change: Filed Weights   11/25/22 0345 11/26/22 0500 11/27/22 0403  Weight: 67.4 kg 63.1 kg 63 kg    Intake/Output:   Intake/Output Summary (Last 24 hours) at 11/27/2022 0834 Last data filed at 11/27/2022 0820 Gross per 24 hour  Intake 240 ml  Output 1500 ml  Net -1260 ml      Physical Exam  General:  well appearing.  No respiratory difficulty HEENT: normal Neck: supple. JVD ~8 cm. Carotids 2+ bilat; no bruits. No lymphadenopathy or thyromegaly appreciated. Cor: PMI nondisplaced. Regular rate & rhythm. No rubs, gallops or murmurs. Lungs: clear Abdomen: soft, nontender, nondistended. No hepatosplenomegaly. No bruits or masses. Good bowel sounds. Extremities: no cyanosis, clubbing, rash, +1 LLE edema  Neuro: alert & oriented x 3, cranial nerves grossly intact. moves all 4 extremities w/o difficulty. Affect pleasant.   Telemetry   NSR 80s (Personally reviewed)    EKG    No new EKG to review  Labs    CBC No results for input(s): "WBC", "NEUTROABS", "HGB", "HCT", "MCV", "PLT" in the last 72 hours. Basic Metabolic Panel Recent Labs    65/78/46 0214 11/27/22 0212  NA 141 142  K 3.8 3.6  CL 101 102  CO2 31 31  GLUCOSE 216* 161*  BUN 32* 34*  CREATININE 3.34* 3.09*  CALCIUM 8.4* 7.8*  MG 2.0 1.9   Liver Function Tests No results for input(s): "AST",  "ALT", "ALKPHOS", "BILITOT", "PROT", "ALBUMIN" in the last 72 hours. No results for input(s): "LIPASE", "AMYLASE" in the last 72 hours. Cardiac Enzymes No results for input(s): "CKTOTAL", "CKMB", "CKMBINDEX", "TROPONINI" in the last 72 hours.  BNP: BNP (last 3 results) Recent Labs    01/22/22 1121 02/27/22 1605 11/23/22 2145  BNP 371.5* 286.3* 1,693.7*    ProBNP (last 3 results) No results for input(s): "PROBNP" in the last 8760 hours.   D-Dimer No results for input(s): "DDIMER" in the last 72 hours. Hemoglobin A1C No results for input(s): "HGBA1C" in the last 72 hours. Fasting Lipid Panel Recent Labs    11/27/22 0212  CHOL 384*  HDL 44  LDLCALC 323*  TRIG 86  CHOLHDL 8.7   Thyroid Function Tests No results for input(s): "TSH", "T4TOTAL", "T3FREE", "THYROIDAB" in the last 72 hours.  Invalid input(s): "FREET3"  Other results:   Imaging    [place] EKG SITE RITE  Result Date: 11/26/2022 If Site Rite image not attached, placement could not be confirmed due to current cardiac rhythm.    Medications:     Scheduled Medications:  hydrALAZINE  50 mg Oral Q8H   influenza vaccine adjuvanted  0.5 mL Intramuscular Tomorrow-1000   insulin aspart  0-15 Units Subcutaneous TID WC   insulin glargine-yfgn  15 Units Subcutaneous QHS   isosorbide mononitrate  30 mg Oral Daily  pantoprazole  20 mg Oral Daily   rosuvastatin  10 mg Oral Daily    Infusions:   PRN Medications: acetaminophen **OR** acetaminophen, hydrALAZINE, melatonin, naLOXone (NARCAN)  injection, ondansetron (ZOFRAN) IV   Patient Profile   Colin [name] is a 72 y.o. [name] speaking male with iCM, CAD with prior MI, LCX and RCA stents in 2012, s/p CABG X 3. LIMA LAD, RSVG PDA, OM1 (08/23/2020), chronic systolic heart failure, DMII, HTN, HLD, CKD stage IV (baseline SCr 2-2.5), tobacco use and GERD. AHF team to see for A/C HFrEF.   Assessment/Plan   Acute on chronic systolic heart failure, iCM - Echo  10/24: EF 25-30%, GIDD, normal RV, LA mild-mod enlarged, large pleural effusion seen in L lateral region, mild-mod MR, no TR.  - RHC 11/23: RA 5, PA 51/11 (27), PCW 12, CO 3.8, CI 2.2.  - NYHA IV on admission, volume remains slightly elevated - PICC being placed this morning, will give another 80 IV lasix today and reevaluate for more later today after CVP set up.  - Suspect he may be low-output. Plan for PICC placement today to trend CVP/co-ox - may need RHC later this week - lactic acid 1.8 - GDMT limited by CKD - Strict I&O, daily weights, continue ted hose   CAD s/p CABG - LHC 7/22: LAD: Prox-mid long 80% stenosis, Lcx: Lcx/OM1 95% ISR, RCA: Prox 80% ISR, followed by In-stent CTO mid-distal vessel. Mildly decompensated ischemic cardiomyopathy. Prox RCA to Dist RCA lesion is 100% stenosed. The lesion was previously treated using a drug eluting stent over 2 years ago. Previously placed stent displays restenosis. - Denies CP - Continue statin, will need referral to lipid clinic at discharge   Hypertension - SBP in 200s on admission, denies taking PO meds at home - Continue hydralazine 50mg  Q8 and Imdur 30 mg daily   AKI on CKD stage 4 - SCr baseline 2-2.5 - SCr 3.34>3.09 today - avoid hypotension - suspect low output/cardiorenal    DMII - A1c 10.8 10/24 - SSI per primary   HLD - LDL 214 5/24 - LDL 323 today 11/27/22 - goal <55 - Switch Crestor> Atorvastatin 80 mg daily - Will need referral to lipid clinic at discharge   Tobacco use - quit smoking in 2020  Length of Stay: 4  Alen Bleacher, NP  11/27/2022, 8:34 AM  Advanced Heart Failure Team Pager [phone] (M-F; 7a - 5p)  Please contact [facility] for night-coverage after hours (5p -7a ) and weekends on [email or web address]

## 2022-11-27 NOTE — Progress Notes (Signed)
CVP <5. Will hold further diuresis for now. Continue ted hose.    Brynda Peon, AGACNP-BC  Advanced Heart Failure Team

## 2022-11-27 NOTE — Progress Notes (Signed)
Peripherally Inserted Central Catheter Placement  The IV Nurse has discussed with the patient and/or persons authorized to consent for the patient, the purpose of this procedure and the potential benefits and risks involved with this procedure.  The benefits include less needle sticks, lab draws from the catheter, and the patient may be discharged home with the catheter. Risks include, but not limited to, infection, bleeding, blood clot (thrombus formation), and puncture of an artery; nerve damage and irregular heartbeat and possibility to perform a PICC exchange if needed/ordered by physician.  Alternatives to this procedure were also discussed.  Bard Power PICC patient education guide, fact sheet on infection prevention and patient information card has been provided to patient /or left at bedside.    PICC Placement Documentation  PICC Double Lumen 11/27/22 Right Brachial 37 cm 1 cm (Active)  Indication for Insertion or Continuance of Line Prolonged intravenous therapies 11/27/22 1017  Exposed Catheter (cm) 1 cm 11/27/22 1017  Site Assessment Clean, Dry, Intact 11/27/22 1017  Lumen #1 Status Flushed;Blood return noted;Saline locked 11/27/22 1017  Lumen #2 Status Flushed;Blood return noted;Saline locked 11/27/22 1017  Dressing Type Transparent 11/27/22 1017  Dressing Status Antimicrobial disc in place 11/27/22 1017  Line Care Connections checked and tightened 11/27/22 1017  Line Adjustment (NICU/IV Team Only) No 11/27/22 1017  Dressing Intervention New dressing 11/27/22 1017  Dressing Change Due 12/04/22 11/27/22 1017       Colin [name] 11/27/2022, 10:20 AM

## 2022-11-27 NOTE — Plan of Care (Signed)
  Problem: Education: Goal: Ability to describe self-care measures that may prevent or decrease complications (Diabetes Survival Skills Education) will improve Outcome: Progressing   Problem: Coping: Goal: Ability to adjust to condition or change in health will improve Outcome: Progressing   Problem: Health Behavior/Discharge Planning: Goal: Ability to identify and utilize available resources and services will improve Outcome: Progressing Goal: Ability to manage health-related needs will improve Outcome: Progressing   Problem: Metabolic: Goal: Ability to maintain appropriate glucose levels will improve Outcome: Progressing   Problem: Nutritional: Goal: Maintenance of adequate nutrition will improve Outcome: Progressing

## 2022-11-27 NOTE — Progress Notes (Signed)
Progress Note   Patient: Colin [name] ZOX:[id number] DOB: [birth year] DOA: 11/23/2022     4 DOS: the patient was seen and examined on 11/27/2022   Brief hospital course: Colin [name] was admitted to the hospital with the working diagnosis of heart failure exacerbation.   72 yo male with the past medical history of heart failure, T2DM, hypertension, hyperlipidemia, and CKD who presented with dyspnea. Reported 3 to 4 days of progressive dyspnea, with orthopnea and lower extremity edema. On his initial physical examination his blood pressure was 213/119, RR 18 to 26, HR 80 to 90, with 02 saturation 98%, lungs with with wheezing or rhonchi, heart with S1 and S2 present and regular, abdomen with no distention and positive lower extremity edema.   Na 142, K 3,8 Cl 106 bicarbonate 28, glucose 243 bun 24 cr 2,40 BNP 1,693 High sensitive troponin 87 and 82  Wbc 7,2 hgb 12,8 plt 239   Chest radiograph with cardiomegaly, bilateral hilar vascular congestion with small bilateral pleural effusions. Sternotomy wires in place.   EKG 96 bpm, normal axis, normal intervals, qtc 444, sinus rhythm with q wave V1, no significant ST segment changes, negative T wave lead II, III, and Avf. Positive LVH.   Patient was placed on IV furosemide and IV nitroglycerin drip.   10/20 patient continue volume overloaded. Blood pressure improved, now off nitroglycerin drip.  10/21 continue to improve volume status but not back to baseline.  10/22 plan for PICC line placement, check SV02.  Patient may need right heart catheterization.   Assessment and Plan: * Acute on chronic systolic CHF (congestive heart failure) (HCC) Echocardiogram with reduced LV systolic function EF 25 to 30%, global hypokinesis, mild asymmetric LVH of the lateral segment. RV systolic function preserved. LA with moderate dilatation, mild to moderate MR, no pericardial effusion.   Urine output is 1,150  ml Since admission is fluid balance is negative  -5,413 Systolic blood pressure 151 to 116 mmHg.   Plan to continue furosemide 80 mg IV q12 hrs.   Continue after load reduction with hydralazine and isosorbide.  Hold on RAAS until renal function more stable Holding ivabradine for now.  Continue with ted hose.    Clinically patient continue to have volume overload, he may need right heart catheterization to assess filling pressures.  Plan for PICC line today and check Sv02.   Essential hypertension Continue afterload reduction with hydralazine and isosorbide.  CKD (chronic kidney disease) stage 4, GFR 15-29 ml/min (HCC) AKI, Hypokalemia.   Renal function with serum cr at 3,0 with K at 3,6 and serum bicarbonate at 31. Na 142. Mg. 1.9   Will add 40 meq Kcl and 2 g Mag sulfate today to avoid hypokalemia and hypomagnesemia.   Type 2 diabetes mellitus with hyperlipidemia (HCC) Uncontrolled T2DM with hyper and hypoglycemia.   Continue insulin sliding scale for glucose cover and monitoring  Basal insulin to 15 units daily to prevent hypoglycemia.   Continue with statin therapy .  GERD (gastroesophageal reflux disease) Continue with pantoprazole.         Subjective: Patient with improvement in his symptoms but not back to baseline, no chest pain. No orthopnea, improved lower extremity edema   Physical Exam: Vitals:   11/26/22 2306 11/27/22 0120 11/27/22 0403 11/27/22 0810  BP: (!) 170/81 132/73 (!) 151/67 (!) 116/51  Pulse: 85 80 87 85  Resp: 16 20 16 14   Temp: 97.8 F (36.6 C)  98.2 F (36.8 C) 97.7 F (36.5 C)  TempSrc: Oral  Oral Oral  SpO2: 97% 93% 94% 94%  Weight:   63 kg   Height:       Neurology awake and alert ENT with mild pallor Cardiovascular with S1 and S2 present and regular with no gallops, rubs or  murmurs Mild to moderate JVD Trace lower extremity edema, ted hose in place Respiratory with mild rales at bases with no wheezing or rhonchi Abdomen with no distention  Data Reviewed:    Family  Communication: no family at the bedside   Disposition: Status is: Inpatient Remains inpatient appropriate because: IV diuresis  Planned Discharge Destination: Home     Author: Coralie Keens, MD 11/27/2022 11:44 AM  For on call review [email or web address].

## 2022-11-27 NOTE — Inpatient Diabetes Management (Signed)
Inpatient Diabetes Program Recommendations  AACE/ADA: New Consensus Statement on Inpatient Glycemic Control (2015)  Target Ranges:  Prepandial:   less than 140 mg/dL      Peak postprandial:   less than 180 mg/dL (1-2 hours)      Critically ill patients:  140 - 180 mg/dL   Lab Results  Component Value Date   GLUCAP 250 (H) 11/27/2022   HGBA1C 10.8 (H) 11/24/2022    Review of Glycemic Control  Latest Reference Range & Units 11/26/22 11:42 11/26/22 16:15 11/26/22 21:19 11/27/22 05:05 11/27/22 12:30  Glucose-Capillary 70 - 99 mg/dL 784 (H) 696 (H) 295 (H) 125 (H) 250 (H)   Diabetes history: DM  Outpatient Diabetes medications:  Humulin 70/30 40 units bid Jardiance 25 mg daily FSL CGM Current orders for Inpatient glycemic control:  Novolog 0-15 units tid with meals  Semglee 15 units q HS  Inpatient Diabetes Program Recommendations:    May consider reducing Novolog correction to sensitive and add Novolog 2-3 units tid with meals (meal coverage- hold if patient eats less than 50% or NPO).    Spoke with patient at bedside.  He does confirm that he was taking 70/30 at home and has been on insulin for a long time.  He states that blood sugars are up and down.  A1C is better than in May of 2024 however still not at goal.  Based on trends in hospital, may need reduction in 70/30 at discharge.  Discussed hypoglycemia symptoms and treatment.  Patient states that when he goes low, he feels dizzy and usually brings blood sugar up with soda.  Will need continued f/u with PCP after d/c.    Thanks,  Beryl Meager, RN, BC-ADM Inpatient Diabetes Coordinator Pager [phone]  (8a-5p)

## 2022-11-27 NOTE — Progress Notes (Signed)
Mobility Specialist Progress Note:   11/27/22 1000  Mobility  Activity Ambulated with assistance in hallway  Level of Assistance Contact guard assist, steadying assist  Assistive Device Front wheel walker  Distance Ambulated (ft) 270 ft  Activity Response Tolerated well  Mobility Referral Yes  $Mobility charge 1 Mobility  Mobility Specialist Start Time (ACUTE ONLY) [phone]  Mobility Specialist Stop Time (ACUTE ONLY) 0932  Mobility Specialist Time Calculation (min) (ACUTE ONLY) 15 min    Pre Mobility: 71 HR,  96% SpO2 During Mobility: 99 HR,  90% SpO2 Post Mobility:  94 HR,  97% SpO2  Pt received in bed, agreeable to mobility. Asymptomatic throughout w/ no complaints. Pt left on EOB with call bell and all needs met.  D'Vante Earlene Plater Mobility Specialist Please contact via Special educational needs teacher or Rehab office at [phone]

## 2022-11-28 ENCOUNTER — Other Ambulatory Visit (HOSPITAL_COMMUNITY): Payer: Self-pay

## 2022-11-28 DIAGNOSIS — I5023 Acute on chronic systolic (congestive) heart failure: Secondary | ICD-10-CM | POA: Diagnosis not present

## 2022-11-28 LAB — COOXEMETRY PANEL
Carboxyhemoglobin: 1.8 % — ABNORMAL HIGH (ref 0.5–1.5)
Methemoglobin: 1 % (ref 0.0–1.5)
O2 Saturation: 84 %
Total hemoglobin: 11.5 g/dL — ABNORMAL LOW (ref 12.0–16.0)

## 2022-11-28 LAB — BASIC METABOLIC PANEL
Anion gap: 6 (ref 5–15)
BUN: 41 mg/dL — ABNORMAL HIGH (ref 8–23)
CO2: 29 mmol/L (ref 22–32)
Calcium: 7.6 mg/dL — ABNORMAL LOW (ref 8.9–10.3)
Chloride: 104 mmol/L (ref 98–111)
Creatinine, Ser: 3.04 mg/dL — ABNORMAL HIGH (ref 0.61–1.24)
GFR, Estimated: 21 mL/min — ABNORMAL LOW (ref >60)
Glucose, Bld: 156 mg/dL — ABNORMAL HIGH (ref 70–99)
Potassium: 3.9 mmol/L (ref 3.5–5.1)
Sodium: 139 mmol/L (ref 135–145)

## 2022-11-28 LAB — MAGNESIUM: Magnesium: 2.3 mg/dL (ref 1.7–2.4)

## 2022-11-28 LAB — GLUCOSE, CAPILLARY
Glucose-Capillary: 107 mg/dL — ABNORMAL HIGH (ref 70–99)
Glucose-Capillary: 253 mg/dL — ABNORMAL HIGH (ref 70–99)

## 2022-11-28 MED ORDER — ISOSORBIDE MONONITRATE ER 60 MG PO TB24
60.0000 mg | ORAL_TABLET | Freq: Every day | ORAL | 0 refills | Status: DC
  Filled 2022-11-28: qty 30, 30d supply, fill #0

## 2022-11-28 MED ORDER — CLOPIDOGREL BISULFATE 75 MG PO TABS
75.0000 mg | ORAL_TABLET | Freq: Every day | ORAL | Status: DC
  Administered 2022-11-28: 75 mg via ORAL
  Filled 2022-11-28: qty 1

## 2022-11-28 MED ORDER — HYDRALAZINE HCL 25 MG PO TABS
75.0000 mg | ORAL_TABLET | Freq: Three times a day (TID) | ORAL | 0 refills | Status: DC
  Filled 2022-11-28: qty 270, 30d supply, fill #0

## 2022-11-28 MED ORDER — CARVEDILOL 3.125 MG PO TABS
3.1250 mg | ORAL_TABLET | Freq: Two times a day (BID) | ORAL | Status: DC
  Administered 2022-11-28: 3.125 mg via ORAL
  Filled 2022-11-28: qty 1

## 2022-11-28 MED ORDER — CARVEDILOL 3.125 MG PO TABS
3.1250 mg | ORAL_TABLET | Freq: Two times a day (BID) | ORAL | 0 refills | Status: DC
  Filled 2022-11-28: qty 60, 30d supply, fill #0

## 2022-11-28 MED ORDER — FUROSEMIDE 40 MG PO TABS
40.0000 mg | ORAL_TABLET | Freq: Every day | ORAL | 0 refills | Status: DC
  Filled 2022-11-28: qty 30, 30d supply, fill #0

## 2022-11-28 MED ORDER — HUMULIN 70/30 KWIKPEN (70-30) 100 UNIT/ML ~~LOC~~ SUPN: 25.0000 [IU] | PEN_INJECTOR | Freq: Three times a day (TID) | SUBCUTANEOUS | Status: DC

## 2022-11-28 MED ORDER — ATORVASTATIN CALCIUM 80 MG PO TABS
80.0000 mg | ORAL_TABLET | Freq: Every day | ORAL | 0 refills | Status: DC
  Filled 2022-11-28: qty 30, 30d supply, fill #0

## 2022-11-28 MED ORDER — EMPAGLIFLOZIN 10 MG PO TABS: 10.0000 mg | ORAL_TABLET | Freq: Every day | ORAL | Status: DC

## 2022-11-28 MED ORDER — FUROSEMIDE 40 MG PO TABS: 40.0000 mg | ORAL_TABLET | Freq: Every day | ORAL | Status: DC

## 2022-11-28 NOTE — Progress Notes (Addendum)
Advanced Heart Failure Rounding Note  PCP-Cardiologist: None   Subjective:   Yesterday CVP down to 5. Diuretics stopped.   CO-OX 84%  Denies SOB. Denies chest pain.   Objective:   Weight Range: 61.9 kg Body mass index is 24.17 kg/m.   Vital Signs:   Temp:  [97.7 F (36.5 C)-98.5 F (36.9 C)] 98.5 F (36.9 C) (10/23 0408) Pulse Rate:  [76-86] 82 (10/23 0408) Resp:  [13-20] 13 (10/23 0408) BP: (116-159)/(51-73) 120/54 (10/23 0408) SpO2:  [91 %-97 %] 93 % (10/23 0408) Weight:  [61.9 kg] 61.9 kg (10/23 0530) Last BM Date : 11/26/22  Weight change: Filed Weights   11/26/22 0500 11/27/22 0403 11/28/22 0530  Weight: 63.1 kg 63 kg 61.9 kg    Intake/Output:   Intake/Output Summary (Last 24 hours) at 11/28/2022 0729 Last data filed at 11/28/2022 0530 Gross per 24 hour  Intake 300 ml  Output 1625 ml  Net -1325 ml     CVP 5-6  Physical Exam  General:  No resp difficulty HEENT: normal Neck: supple. no JVD. Carotids 2+ bilat; no bruits. No lymphadenopathy or thryomegaly appreciated. Cor: PMI nondisplaced. Regular rate & rhythm. No rubs, gallops or murmurs. Lungs: clear Abdomen: soft, nontender, nondistended. No hepatosplenomegaly. No bruits or masses. Good bowel sounds. Extremities: no cyanosis, clubbing, rash, edema. R and LLE ted hose. RUE PICC Neuro: alert & orientedx3, cranial nerves grossly intact. moves all 4 extremities w/o difficulty. Affect pleasant  Telemetry  SR 70-80s personally reviewed.   EKG    No new EKG to review  Labs    CBC No results for input(s): "WBC", "NEUTROABS", "HGB", "HCT", "MCV", "PLT" in the last 72 hours. Basic Metabolic Panel Recent Labs    13/08/65 0212 11/28/22 0445  NA 142 139  K 3.6 3.9  CL 102 104  CO2 31 29  GLUCOSE 161* 156*  BUN 34* 41*  CREATININE 3.09* 3.04*  CALCIUM 7.8* 7.6*  MG 1.9 2.3   Liver Function Tests No results for input(s): "AST", "ALT", "ALKPHOS", "BILITOT", "PROT", "ALBUMIN" in the last  72 hours. No results for input(s): "LIPASE", "AMYLASE" in the last 72 hours. Cardiac Enzymes No results for input(s): "CKTOTAL", "CKMB", "CKMBINDEX", "TROPONINI" in the last 72 hours.  BNP: BNP (last 3 results) Recent Labs    01/22/22 1121 02/27/22 1605 11/23/22 2145  BNP 371.5* 286.3* 1,693.7*    ProBNP (last 3 results) No results for input(s): "PROBNP" in the last 8760 hours.   D-Dimer No results for input(s): "DDIMER" in the last 72 hours. Hemoglobin A1C No results for input(s): "HGBA1C" in the last 72 hours. Fasting Lipid Panel Recent Labs    11/27/22 0212  CHOL 384*  HDL 44  LDLCALC 323*  TRIG 86  CHOLHDL 8.7   Thyroid Function Tests No results for input(s): "TSH", "T4TOTAL", "T3FREE", "THYROIDAB" in the last 72 hours.  Invalid input(s): "FREET3"  Other results:   Imaging    No results found.   Medications:     Scheduled Medications:  atorvastatin  80 mg Oral Daily   Chlorhexidine Gluconate Cloth  6 each Topical Daily   hydrALAZINE  75 mg Oral Q8H   influenza vaccine adjuvanted  0.5 mL Intramuscular Tomorrow-1000   insulin aspart  0-15 Units Subcutaneous TID WC   insulin glargine-yfgn  15 Units Subcutaneous QHS   isosorbide mononitrate  60 mg Oral Daily   pantoprazole  20 mg Oral Daily   sodium chloride flush  10-40 mL Intracatheter Q12H  Infusions:   PRN Medications: acetaminophen **OR** acetaminophen, melatonin, naLOXone (NARCAN)  injection, ondansetron (ZOFRAN) IV, sodium chloride flush   Patient Profile   Colin [name] is a 72 y.o. [name] speaking male with iCM, CAD with prior MI, LCX and RCA stents in 2012, s/p CABG X 3. LIMA LAD, RSVG PDA, OM1 (08/23/2020), chronic systolic heart failure, DMII, HTN, HLD, CKD stage IV (baseline SCr 2-2.5), tobacco use and GERD. AHF team to see for A/C HFrEF.   Assessment/Plan   Acute on chronic systolic heart failure, iCM - Echo 10/24: EF 25-30%, GIDD, normal RV, LA mild-mod enlarged, large  pleural effusion seen in L lateral region, mild-mod MR, no TR.  - RHC 11/23: RA 5, PA 51/11 (27), PCW 12, CO 3.8, CI 2.2.  - NYHA IV on admission, volume remains slightly elevated - Lactic acid 1.8. PICC placed with stable mixed venous sat was stable.  - GDMT limited by CKD - DIuresed with IV lasix. Euvolemic Volume status stable. CVP 5.  CO-OX stable.  - Add coreg 3.125 mg twice a day. - Continue hydralazine 75 mg tid/imdur 60 mg daily  - Start jardiance 10 mg tomorrow.  - Start lasix 40 mg daily tomorrow.  - No MRA with CKD Stage IV.    CAD s/p CABG - LHC 7/22: LAD: Prox-mid long 80% stenosis, Lcx: Lcx/OM1 95% ISR, RCA: Prox 80% ISR, followed by In-stent CTO mid-distal vessel. Mildly decompensated ischemic cardiomyopathy. Prox RCA to Dist RCA lesion is 100% stenosed. The lesion was previously treated using a drug eluting stent over 2 years ago. Previously placed stent displays restenosis. - No chest pain.  - Continue  high intensity statin, will need referral to lipid clinic at discharge - Restart plavix 75 mg daily.    Hypertension - Stable with titration of hydralazine/imdur   AKI on CKD stage 4 - SCr baseline 2-2.5 - SCr 3.34>3.0 today - avoid hypotension   DMII - A1c 10.8 10/24 - SSI per primary   HLD - LDL 214 5/24 - LDL 323 today 11/27/22 - goal <55 - Continue high intensity statin-->Atorvastatin 80 mg daily - I referred him to the Lipid Clinic.    Tobacco use - quit smoking in 2020  Will set up HF follow up next week with Dr Elwyn Lade.  HF Meds for d/c . [facility] need [facility] med[facility]   Coreg 3.125 mg twice a day.  Hydralazine 75 mg tid Imdur 60 mg daily  Atorvastatin 80 mg daily  Plavix 75 mg daily  Jardiance 10 mg start 10/24 Lasix 40 mg daily start 10/24  Length of Stay: 5  Nicoli Nardozzi, NP  11/28/2022, 7:29 AM  Advanced Heart Failure Team Pager [phone] (M-F; 7a - 5p)  Please contact [facility] for night-coverage after hours (5p -7a ) and weekends  on [email or web address]

## 2022-11-28 NOTE — Discharge Summary (Signed)
Physician Discharge Summary  Colin [name]:[id number] DOB: May 24, 1950 DOA: 11/23/2022  PCP: Grayce Sessions, NP  Admit date: 11/23/2022 Discharge date: 11/28/2022  Time spent:45 minutes  Recommendations for Outpatient Follow-up:  Advanced heart failure clinic Dr. Elwyn Lade in 1 week, please check BMP at follow-up Referral sent to lipid clinic Right upper lobe nodule noted on x-ray, needs follow-up   Discharge Diagnoses:  Principal Problem:   Acute on chronic systolic CHF (congestive heart failure) (HCC)   AKI on CKD 4   CAD/CABG   Essential hypertension   CKD (chronic kidney disease) stage 4, GFR 15-29 ml/min (HCC)   Type 2 diabetes mellitus with hyperlipidemia (HCC)   GERD (gastroesophageal reflux disease) Pulmonary nodule   Discharge Condition: Improved  Diet recommendation: Low-salt, heart healthy, diet like  Filed Weights   11/26/22 0500 11/27/22 0403 11/28/22 0530  Weight: 63.1 kg 63 kg 61.9 kg    History of present illness:  72/M w systolic CHF, T2DM, hypertension, hyperlipidemia, and CKD who presented with dyspnea, orthopnea and lower extremity edema.  -bun 24 cr 2,40, BNP 1,693, troponin 87 and 82 , hgb 12,8 -CXRw cardiomegaly, bilateral hilar vascular congestion with pleural effusions -placed on IV furosemide and IV nitroglycerin drip.   Hospital Course:   Acute on chronic systolic CHF -Admitted with dyspnea, orthopnea and edema  -2D echo this admission noted EF 25 to 30%, global hypokinesis, RV systolic function preserved. mild to moderate MR, no pericardial effusion.  -Diuresed with IV Lasix, he is 6.3 L negative creatinine has improved and stabilized around 3 at the time of discharge -GDMT limited by CKD -Followed by heart failure team, recommended to continue hydralazine and Imdur and start Lasix tomorrow -Initially there were plans to start Jardiance as well however this was deferred at this time on account of HbA1c of 10.8 -Follow-up in heart  failure clinic next week with repeat labs   Essential hypertension Continue  hydralazine and isosorbide.   AKI on CKD 4  -Baseline creatinine around 2-2.5, peaked at 3.3 this admission, now improving down to 3.0 at discharge,  -Recommend nephrology follow-up and avoid hypotension  CAD/CABG -Stable, continue statin, restarted Plavix -Referral sent to lipid clinic   Type 2 diabetes mellitus with hyperlipidemia (HCC) Uncontrolled T2DM with hyper and hypoglycemia.  -HbA1c is 10.8 -Continue insulin 70/30, SGLT2i discontinued, recommended diet and lifestyle modification   GERD (gastroesophageal reflux disease) Continue with pantoprazole.   Right lung pulmonary nodule -Noted on x-ray, recommend follow-up   Liver density   Indeterminate 1.5 x 0.7 cm chronic peri hepatic soft tissue density noted incidentally on CT -Needs repeat imaging in few months    Discharge Exam: Vitals:   11/28/22 0408 11/28/22 0745  BP: (!) 120/54 129/71  Pulse: 82 86  Resp: 13 20  Temp: 98.5 F (36.9 C) 98.7 F (37.1 C)  SpO2: 93% 94%   Gen: Awake, Alert, Oriented X 3,  HEENT: no JVD Lungs: Good air movement bilaterally, CTAB CVS: S1S2/RRR Abd: soft, Non tender, non distended, BS present Extremities: No edema Skin: no new rashes on exposed skin   Discharge Instructions   Discharge Instructions     AMB Referral to [facility]   Complete by: As directed    Can see Malena Peer   Reason For Referral: Lipids   Diet - low sodium heart healthy   Complete by: As directed    Increase activity slowly   Complete by: As directed       Allergies as  Physician Discharge Summary  Colin [name]:[id number] DOB: May 24, 1950 DOA: 11/23/2022  PCP: Grayce Sessions, NP  Admit date: 11/23/2022 Discharge date: 11/28/2022  Time spent:45 minutes  Recommendations for Outpatient Follow-up:  Advanced heart failure clinic Dr. Elwyn Lade in 1 week, please check BMP at follow-up Referral sent to lipid clinic Right upper lobe nodule noted on x-ray, needs follow-up   Discharge Diagnoses:  Principal Problem:   Acute on chronic systolic CHF (congestive heart failure) (HCC)   AKI on CKD 4   CAD/CABG   Essential hypertension   CKD (chronic kidney disease) stage 4, GFR 15-29 ml/min (HCC)   Type 2 diabetes mellitus with hyperlipidemia (HCC)   GERD (gastroesophageal reflux disease) Pulmonary nodule   Discharge Condition: Improved  Diet recommendation: Low-salt, heart healthy, diet like  Filed Weights   11/26/22 0500 11/27/22 0403 11/28/22 0530  Weight: 63.1 kg 63 kg 61.9 kg    History of present illness:  72/M w systolic CHF, T2DM, hypertension, hyperlipidemia, and CKD who presented with dyspnea, orthopnea and lower extremity edema.  -bun 24 cr 2,40, BNP 1,693, troponin 87 and 82 , hgb 12,8 -CXRw cardiomegaly, bilateral hilar vascular congestion with pleural effusions -placed on IV furosemide and IV nitroglycerin drip.   Hospital Course:   Acute on chronic systolic CHF -Admitted with dyspnea, orthopnea and edema  -2D echo this admission noted EF 25 to 30%, global hypokinesis, RV systolic function preserved. mild to moderate MR, no pericardial effusion.  -Diuresed with IV Lasix, he is 6.3 L negative creatinine has improved and stabilized around 3 at the time of discharge -GDMT limited by CKD -Followed by heart failure team, recommended to continue hydralazine and Imdur and start Lasix tomorrow -Initially there were plans to start Jardiance as well however this was deferred at this time on account of HbA1c of 10.8 -Follow-up in heart  failure clinic next week with repeat labs   Essential hypertension Continue  hydralazine and isosorbide.   AKI on CKD 4  -Baseline creatinine around 2-2.5, peaked at 3.3 this admission, now improving down to 3.0 at discharge,  -Recommend nephrology follow-up and avoid hypotension  CAD/CABG -Stable, continue statin, restarted Plavix -Referral sent to lipid clinic   Type 2 diabetes mellitus with hyperlipidemia (HCC) Uncontrolled T2DM with hyper and hypoglycemia.  -HbA1c is 10.8 -Continue insulin 70/30, SGLT2i discontinued, recommended diet and lifestyle modification   GERD (gastroesophageal reflux disease) Continue with pantoprazole.   Right lung pulmonary nodule -Noted on x-ray, recommend follow-up   Liver density   Indeterminate 1.5 x 0.7 cm chronic peri hepatic soft tissue density noted incidentally on CT -Needs repeat imaging in few months    Discharge Exam: Vitals:   11/28/22 0408 11/28/22 0745  BP: (!) 120/54 129/71  Pulse: 82 86  Resp: 13 20  Temp: 98.5 F (36.9 C) 98.7 F (37.1 C)  SpO2: 93% 94%   Gen: Awake, Alert, Oriented X 3,  HEENT: no JVD Lungs: Good air movement bilaterally, CTAB CVS: S1S2/RRR Abd: soft, Non tender, non distended, BS present Extremities: No edema Skin: no new rashes on exposed skin   Discharge Instructions   Discharge Instructions     AMB Referral to [facility] Pharm-D   Complete by: As directed    Can see Malena Peer   Reason For Referral: Lipids   Diet - low sodium heart healthy   Complete by: As directed    Increase activity slowly   Complete by: As directed       Allergies as  Physician Discharge Summary  Colin [name]:[id number] DOB: May 24, 1950 DOA: 11/23/2022  PCP: Grayce Sessions, NP  Admit date: 11/23/2022 Discharge date: 11/28/2022  Time spent:45 minutes  Recommendations for Outpatient Follow-up:  Advanced heart failure clinic Dr. Elwyn Lade in 1 week, please check BMP at follow-up Referral sent to lipid clinic Right upper lobe nodule noted on x-ray, needs follow-up   Discharge Diagnoses:  Principal Problem:   Acute on chronic systolic CHF (congestive heart failure) (HCC)   AKI on CKD 4   CAD/CABG   Essential hypertension   CKD (chronic kidney disease) stage 4, GFR 15-29 ml/min (HCC)   Type 2 diabetes mellitus with hyperlipidemia (HCC)   GERD (gastroesophageal reflux disease) Pulmonary nodule   Discharge Condition: Improved  Diet recommendation: Low-salt, heart healthy, diet like  Filed Weights   11/26/22 0500 11/27/22 0403 11/28/22 0530  Weight: 63.1 kg 63 kg 61.9 kg    History of present illness:  72/M w systolic CHF, T2DM, hypertension, hyperlipidemia, and CKD who presented with dyspnea, orthopnea and lower extremity edema.  -bun 24 cr 2,40, BNP 1,693, troponin 87 and 82 , hgb 12,8 -CXRw cardiomegaly, bilateral hilar vascular congestion with pleural effusions -placed on IV furosemide and IV nitroglycerin drip.   Hospital Course:   Acute on chronic systolic CHF -Admitted with dyspnea, orthopnea and edema  -2D echo this admission noted EF 25 to 30%, global hypokinesis, RV systolic function preserved. mild to moderate MR, no pericardial effusion.  -Diuresed with IV Lasix, he is 6.3 L negative creatinine has improved and stabilized around 3 at the time of discharge -GDMT limited by CKD -Followed by heart failure team, recommended to continue hydralazine and Imdur and start Lasix tomorrow -Initially there were plans to start Jardiance as well however this was deferred at this time on account of HbA1c of 10.8 -Follow-up in heart  failure clinic next week with repeat labs   Essential hypertension Continue  hydralazine and isosorbide.   AKI on CKD 4  -Baseline creatinine around 2-2.5, peaked at 3.3 this admission, now improving down to 3.0 at discharge,  -Recommend nephrology follow-up and avoid hypotension  CAD/CABG -Stable, continue statin, restarted Plavix -Referral sent to lipid clinic   Type 2 diabetes mellitus with hyperlipidemia (HCC) Uncontrolled T2DM with hyper and hypoglycemia.  -HbA1c is 10.8 -Continue insulin 70/30, SGLT2i discontinued, recommended diet and lifestyle modification   GERD (gastroesophageal reflux disease) Continue with pantoprazole.   Right lung pulmonary nodule -Noted on x-ray, recommend follow-up   Liver density   Indeterminate 1.5 x 0.7 cm chronic peri hepatic soft tissue density noted incidentally on CT -Needs repeat imaging in few months    Discharge Exam: Vitals:   11/28/22 0408 11/28/22 0745  BP: (!) 120/54 129/71  Pulse: 82 86  Resp: 13 20  Temp: 98.5 F (36.9 C) 98.7 F (37.1 C)  SpO2: 93% 94%   Gen: Awake, Alert, Oriented X 3,  HEENT: no JVD Lungs: Good air movement bilaterally, CTAB CVS: S1S2/RRR Abd: soft, Non tender, non distended, BS present Extremities: No edema Skin: no new rashes on exposed skin   Discharge Instructions   Discharge Instructions     AMB Referral to [facility] Pharm-D   Complete by: As directed    Can see Malena Peer   Reason For Referral: Lipids   Diet - low sodium heart healthy   Complete by: As directed    Increase activity slowly   Complete by: As directed       Allergies as  of 11/28/2022   No Known Allergies      Medication List     STOP taking these medications    amLODipine 5 MG tablet Commonly known as: NORVASC   empagliflozin 25 MG Tabs tablet Commonly known as: Jardiance   metoprolol succinate 50 MG 24 hr tablet Commonly known as: TOPROL-XL   rosuvastatin 10 MG tablet Commonly known as:  CRESTOR       TAKE these medications    atorvastatin 80 MG tablet Commonly known as: LIPITOR Take 1 tablet (80 mg total) by mouth daily.   carvedilol 3.125 MG tablet Commonly known as: COREG Take 1 tablet (3.125 mg total) by mouth 2 (two) times daily with a meal.   clopidogrel 75 MG tablet Commonly known as: Plavix Take 1 tablet (75 mg total) by mouth daily.   FreeStyle [place] 3 Reader [place] Use as directed!   FreeStyle Libre 3 Sensor Misc Use as instructed change every 14 days   furosemide 40 MG tablet Commonly known as: LASIX Take 1 tablet (40 mg total) by mouth daily. Start taking on: November 29, 2022 What changed:  medication strength how much to take how to take this when to take this Another medication with the same name was removed. Continue taking this medication, and follow the directions you see here.   HumuLIN 70/30 KwikPen (70-30) 100 UNIT/ML KwikPen Generic drug: insulin isophane & regular human KwikPen Inject 25 Units into the skin 3 (three) times daily. What changed: how much to take   hydrALAZINE 25 MG tablet Commonly known as: APRESOLINE Take 3 tablets (75 mg total) by mouth 3 (three) times daily.   isosorbide mononitrate 60 MG 24 hr tablet Commonly known as: IMDUR Take 1 tablet (60 mg total) by mouth daily. What changed:  medication strength how much to take   nitroGLYCERIN 0.4 MG SL tablet Commonly known as: NITROSTAT Place 1 tab (0.4mg  total) under the tongue every 5 minutes as needed for chest pain   pantoprazole 20 MG tablet Commonly known as: PROTONIX Take 1 tablet (20 mg total) by mouth daily.   TechLite Pen Needles 31G X 5 MM Misc Generic drug: Insulin Pen Needle Uses three times daily with insulin.       No Known Allergies  Follow-up Information     [facility] Follow up on 12/06/2022.   Specialty: Cardiology Why: [address] [facility] Contact information: [address] [place] [address] [phone]                 The results of significant diagnostics from this hospitalization (including imaging, microbiology, ancillary and laboratory) are listed below for reference.    Significant Diagnostic Studies: [place] EKG SITE RITE  Result Date: 11/26/2022 If Site Rite image not attached, placement could not be confirmed due to current cardiac rhythm.  ECHOCARDIOGRAM COMPLETE  Result Date: 11/24/2022    ECHOCARDIOGRAM REPORT   Patient Name:   Colin [name] [facility] Date of Exam: 11/24/2022 Medical Rec #:  [id number]    Height:       63.0 in Accession #:    [id number]   Weight:       140.0 lb Date of Birth:  1950-05-12    BSA:          1.662 m Patient Age:    72 years     BP:  of 11/28/2022   No Known Allergies      Medication List     STOP taking these medications    amLODipine 5 MG tablet Commonly known as: NORVASC   empagliflozin 25 MG Tabs tablet Commonly known as: Jardiance   metoprolol succinate 50 MG 24 hr tablet Commonly known as: TOPROL-XL   rosuvastatin 10 MG tablet Commonly known as:  CRESTOR       TAKE these medications    atorvastatin 80 MG tablet Commonly known as: LIPITOR Take 1 tablet (80 mg total) by mouth daily.   carvedilol 3.125 MG tablet Commonly known as: COREG Take 1 tablet (3.125 mg total) by mouth 2 (two) times daily with a meal.   clopidogrel 75 MG tablet Commonly known as: Plavix Take 1 tablet (75 mg total) by mouth daily.   FreeStyle [place] 3 Reader [place] Use as directed!   FreeStyle Libre 3 Sensor Misc Use as instructed change every 14 days   furosemide 40 MG tablet Commonly known as: LASIX Take 1 tablet (40 mg total) by mouth daily. Start taking on: November 29, 2022 What changed:  medication strength how much to take how to take this when to take this Another medication with the same name was removed. Continue taking this medication, and follow the directions you see here.   HumuLIN 70/30 KwikPen (70-30) 100 UNIT/ML KwikPen Generic drug: insulin isophane & regular human KwikPen Inject 25 Units into the skin 3 (three) times daily. What changed: how much to take   hydrALAZINE 25 MG tablet Commonly known as: APRESOLINE Take 3 tablets (75 mg total) by mouth 3 (three) times daily.   isosorbide mononitrate 60 MG 24 hr tablet Commonly known as: IMDUR Take 1 tablet (60 mg total) by mouth daily. What changed:  medication strength how much to take   nitroGLYCERIN 0.4 MG SL tablet Commonly known as: NITROSTAT Place 1 tab (0.4mg  total) under the tongue every 5 minutes as needed for chest pain   pantoprazole 20 MG tablet Commonly known as: PROTONIX Take 1 tablet (20 mg total) by mouth daily.   TechLite Pen Needles 31G X 5 MM Misc Generic drug: Insulin Pen Needle Uses three times daily with insulin.       No Known Allergies  Follow-up Information     [facility] Follow up on 12/06/2022.   Specialty: Cardiology Why: [address] [facility] Contact information: [address] [place] [address] [phone]                 The results of significant diagnostics from this hospitalization (including imaging, microbiology, ancillary and laboratory) are listed below for reference.    Significant Diagnostic Studies: [place] EKG SITE RITE  Result Date: 11/26/2022 If Site Rite image not attached, placement could not be confirmed due to current cardiac rhythm.  ECHOCARDIOGRAM COMPLETE  Result Date: 11/24/2022    ECHOCARDIOGRAM REPORT   Patient Name:   Colin [name] [facility] Date of Exam: 11/24/2022 Medical Rec #:  [id number]    Height:       63.0 in Accession #:    [id number]   Weight:       140.0 lb Date of Birth:  1950-05-12    BSA:          1.662 m Patient Age:    72 years     BP:  of 11/28/2022   No Known Allergies      Medication List     STOP taking these medications    amLODipine 5 MG tablet Commonly known as: NORVASC   empagliflozin 25 MG Tabs tablet Commonly known as: Jardiance   metoprolol succinate 50 MG 24 hr tablet Commonly known as: TOPROL-XL   rosuvastatin 10 MG tablet Commonly known as:  CRESTOR       TAKE these medications    atorvastatin 80 MG tablet Commonly known as: LIPITOR Take 1 tablet (80 mg total) by mouth daily.   carvedilol 3.125 MG tablet Commonly known as: COREG Take 1 tablet (3.125 mg total) by mouth 2 (two) times daily with a meal.   clopidogrel 75 MG tablet Commonly known as: Plavix Take 1 tablet (75 mg total) by mouth daily.   FreeStyle [place] 3 Reader [place] Use as directed!   FreeStyle Libre 3 Sensor Misc Use as instructed change every 14 days   furosemide 40 MG tablet Commonly known as: LASIX Take 1 tablet (40 mg total) by mouth daily. Start taking on: November 29, 2022 What changed:  medication strength how much to take how to take this when to take this Another medication with the same name was removed. Continue taking this medication, and follow the directions you see here.   HumuLIN 70/30 KwikPen (70-30) 100 UNIT/ML KwikPen Generic drug: insulin isophane & regular human KwikPen Inject 25 Units into the skin 3 (three) times daily. What changed: how much to take   hydrALAZINE 25 MG tablet Commonly known as: APRESOLINE Take 3 tablets (75 mg total) by mouth 3 (three) times daily.   isosorbide mononitrate 60 MG 24 hr tablet Commonly known as: IMDUR Take 1 tablet (60 mg total) by mouth daily. What changed:  medication strength how much to take   nitroGLYCERIN 0.4 MG SL tablet Commonly known as: NITROSTAT Place 1 tab (0.4mg  total) under the tongue every 5 minutes as needed for chest pain   pantoprazole 20 MG tablet Commonly known as: PROTONIX Take 1 tablet (20 mg total) by mouth daily.   TechLite Pen Needles 31G X 5 MM Misc Generic drug: Insulin Pen Needle Uses three times daily with insulin.       No Known Allergies  Follow-up Information     [facility] Follow up on 12/06/2022.   Specialty: Cardiology Why: [address] [facility] Contact information: [address] [place] [address] [phone]                 The results of significant diagnostics from this hospitalization (including imaging, microbiology, ancillary and laboratory) are listed below for reference.    Significant Diagnostic Studies: [place] EKG SITE RITE  Result Date: 11/26/2022 If Site Rite image not attached, placement could not be confirmed due to current cardiac rhythm.  ECHOCARDIOGRAM COMPLETE  Result Date: 11/24/2022    ECHOCARDIOGRAM REPORT   Patient Name:   Colin [name] [facility] Date of Exam: 11/24/2022 Medical Rec #:  [id number]    Height:       63.0 in Accession #:    [id number]   Weight:       140.0 lb Date of Birth:  1950-05-12    BSA:          1.662 m Patient Age:    72 years     BP:

## 2022-11-28 NOTE — Progress Notes (Signed)
Mobility Specialist Progress Note:   11/28/22 1000  Mobility  Activity Ambulated with assistance in hallway  Level of Assistance Contact guard assist, steadying assist  Assistive Device Front wheel walker  Distance Ambulated (ft) 340 ft  Activity Response Tolerated well  Mobility Referral Yes  $Mobility charge 1 Mobility  Mobility Specialist Start Time (ACUTE ONLY) 0909  Mobility Specialist Stop Time (ACUTE ONLY) 0921  Mobility Specialist Time Calculation (min) (ACUTE ONLY) 12 min    Pre Mobility: 83 HR,  145/64 (86) BP Post Mobility:  89 HR  Received pt in bed having no complaints and agreeable to mobility. Pt was asymptomatic throughout ambulation and returned to room w/o fault. Left on EOB w/ call bell in reach and all needs met.   Colin [name] Mobility Specialist Please contact via Special educational needs teacher or Rehab office at [phone]

## 2022-11-28 NOTE — Plan of Care (Signed)
  Problem: Education: Goal: Ability to describe self-care measures that may prevent or decrease complications (Diabetes Survival Skills Education) will improve 11/28/2022 0141 by Charmian Muff, RN Outcome: Progressing 11/28/2022 0140 by Charmian Muff, RN Outcome: Progressing Goal: Individualized Educational Video(s) 11/28/2022 0141 by Charmian Muff, RN Outcome: Progressing 11/28/2022 0140 by Charmian Muff, RN Outcome: Progressing   Problem: Coping: Goal: Ability to adjust to condition or change in health will improve 11/28/2022 0141 by Charmian Muff, RN Outcome: Progressing 11/28/2022 0140 by Charmian Muff, RN Outcome: Progressing   Problem: Fluid Volume: Goal: Ability to maintain a balanced intake and output will improve Outcome: Progressing

## 2022-11-28 NOTE — Progress Notes (Signed)
RA DL PICC removed per protocol per MD order. Manual pressure applied for 3 mins. Vaseline gauze, gauze, and Tegaderm applied over insertion site. No bleeding or swelling noted. Instructed patient to remain in bed for thirty mins. Educated patient about S/S of infection and when to call MD; no heavy lifting or pressure on right side for 24 hours; keep dressing dry and intact for 24 hours. Pt verbalized comprehension.

## 2022-11-28 NOTE — Plan of Care (Signed)

## 2022-11-28 NOTE — Plan of Care (Signed)

## 2022-11-28 NOTE — TOC Initial Note (Signed)
Transition of Care [facility]) - Initial/Assessment Note    Patient Details  Name: Colin [name] MRN: [id number] Date of Birth: Jun 30, 1950  Transition of Care [facility] CM/SW Contact:    Elliot Cousin, RN Phone Number: [phone] 11/28/2022, 11:36 AM  Clinical Narrative:    CM spoke to pt and states his son will provide transportation home. Meds to come up from [facility] pharmacy. Pt reports having a RW and scale at home. Educated importance of daily weights.                Expected Discharge Plan: Home/Self Care Barriers to Discharge: No Barriers Identified   Patient Goals and CMS Choice Patient states their goals for this hospitalization and ongoing recovery are:: return home   Choice offered to / list presented to : NA      Expected Discharge Plan and Services In-house Referral: NA Discharge Planning Services: CM Consult Post Acute Care Choice: NA Living arrangements for the past 2 months: Single Family Home Expected Discharge Date: 11/28/22                 DME Agency: NA       HH Arranged: NA          Prior Living Arrangements/Services Living arrangements for the past 2 months: Single Family Home Lives with:: Adult Children (son) Patient language and need for interpreter reviewed:: Yes Do you feel safe going back to the place where you live?: Yes      Need for Family Participation in Patient Care: Yes (Comment) Care giver support system in place?: Yes (comment) Current home services: DME (home oxygen 2 liters, walker , cane) Criminal Activity/Legal Involvement Pertinent to Current Situation/Hospitalization: No - Comment as needed  Activities of Daily Living   ADL Screening (condition at time of admission) Independently performs ADLs?: Yes (appropriate for developmental age) Is the patient deaf or have difficulty hearing?: No Does the patient have difficulty seeing, even when wearing glasses/contacts?: No Does the patient have difficulty concentrating,  remembering, or making decisions?: No  Permission Sought/Granted Permission sought to share information with : Family Supports, PCP Permission granted to share information with : Yes, Verbal Permission Granted  Share Information with NAME: Rol Rcom     Permission granted to share info w Relationship: son  Permission granted to share info w Contact Information: [phone]  Emotional Assessment Appearance:: Appears stated age Attitude/Demeanor/Rapport: Engaged Affect (typically observed): Appropriate Orientation: : Oriented to Self, Oriented to Place, Oriented to  Time, Oriented to Situation Alcohol / Substance Use: Not Applicable Psych Involvement: No (comment)  Admission diagnosis:  Acute on chronic systolic heart failure (HCC) [I50.23] Noncompliance w/medication treatment due to intermit use of medication [Z91.148] Congestive heart failure, unspecified HF chronicity, unspecified heart failure type (HCC) [I50.9] Patient Active Problem List   Diagnosis Date Noted   Hypertensive crisis 11/24/2022   Elevated troponin 11/24/2022   Hypercalcemia 11/24/2022   Epigastric pain 11/24/2022   GERD (gastroesophageal reflux disease) 11/24/2022   CKD (chronic kidney disease) stage 4, GFR 15-29 ml/min (HCC) 11/24/2022   Acute on chronic combined systolic and diastolic CHF (congestive heart failure) (HCC) 11/23/2022   Left upper quadrant abdominal pain 05/30/2022   Injury of right shoulder 03/02/2022   Sinus bradycardia 01/22/2022   Near syncope 01/22/2022   Bradycardia 01/22/2022   COPD with acute exacerbation (HCC) 12/21/2021   Acute on chronic systolic CHF (congestive heart failure) (HCC) 12/16/2021   Acute hypoxic respiratory failure (HCC) 12/16/2021  AKI (acute kidney injury) (HCC) 12/16/2021   Protein-calorie malnutrition, severe 08/29/2020   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   Coronary artery disease 08/23/2020   HFrEF (heart failure with reduced ejection fraction) (HCC)    Sinus pause     Acute pulmonary edema (HCC)    Hyperglycemia    NSTEMI (non-ST elevated myocardial infarction) (HCC)    Shortness of breath    Smoking    Thoracic aorta atherosclerosis (HCC)    Essential hypertension 10/07/2019   Atherosclerosis of native coronary artery of native heart with unstable angina pectoris (HCC) 04/26/2010   Type 2 diabetes mellitus with hyperlipidemia (HCC) 04/26/2010   HLD (hyperlipidemia) 04/26/2010   PCP:  Grayce Sessions, NP Pharmacy:   [facility] [address], [address] [place] [address] Phone: [phone] Fax: [phone]  [facility] [place], [place] - [address] [place] [address] Phone: [phone] Fax: [phone]  [facility] [address] [place] [address] Phone: [phone] Fax: [phone]     Social Determinants of Health (SDOH) Social History: SDOH Screenings   Food Insecurity: No Food Insecurity (11/24/2022)  Housing: Low Risk  (11/24/2022)  Transportation Needs: No Transportation Needs (11/24/2022)  Utilities: Not At Risk (11/24/2022)  Depression (PHQ2-9): High Risk (01/11/2022)  Tobacco Use: Medium Risk (11/23/2022)   SDOH Interventions:     Readmission Risk Interventions     No data to display

## 2022-12-06 ENCOUNTER — Encounter (HOSPITAL_COMMUNITY): Payer: Self-pay | Admitting: Cardiology

## 2022-12-06 ENCOUNTER — Ambulatory Visit (HOSPITAL_BASED_OUTPATIENT_CLINIC_OR_DEPARTMENT_OTHER)
Admit: 2022-12-06 | Discharge: 2022-12-06 | Disposition: A | Discharge status: Home / Self Care | Payer: 59 | Source: Ambulatory Visit | Attending: Cardiology | Admitting: Cardiology

## 2022-12-06 ENCOUNTER — Encounter (HOSPITAL_COMMUNITY): Payer: Self-pay

## 2022-12-06 ENCOUNTER — Observation Stay (HOSPITAL_COMMUNITY)
Admission: EM | Admit: 2022-12-06 | Discharge: 2022-12-08 | Disposition: A | Discharge status: Home / Self Care | Payer: 59 | Attending: Internal Medicine | Admitting: Internal Medicine

## 2022-12-06 ENCOUNTER — Emergency Department (HOSPITAL_COMMUNITY): Payer: 59

## 2022-12-06 ENCOUNTER — Other Ambulatory Visit: Payer: Self-pay

## 2022-12-06 VITALS — BP 90/50 | HR 64

## 2022-12-06 DIAGNOSIS — Z7902 Long term (current) use of antithrombotics/antiplatelets: Secondary | ICD-10-CM | POA: Insufficient documentation

## 2022-12-06 DIAGNOSIS — Z951 Presence of aortocoronary bypass graft: Secondary | ICD-10-CM | POA: Insufficient documentation

## 2022-12-06 DIAGNOSIS — Z794 Long term (current) use of insulin: Secondary | ICD-10-CM | POA: Insufficient documentation

## 2022-12-06 DIAGNOSIS — I429 Cardiomyopathy, unspecified: Secondary | ICD-10-CM | POA: Insufficient documentation

## 2022-12-06 DIAGNOSIS — K219 Gastro-esophageal reflux disease without esophagitis: Secondary | ICD-10-CM | POA: Insufficient documentation

## 2022-12-06 DIAGNOSIS — I10 Essential (primary) hypertension: Secondary | ICD-10-CM | POA: Diagnosis present

## 2022-12-06 DIAGNOSIS — Z950 Presence of cardiac pacemaker: Secondary | ICD-10-CM | POA: Insufficient documentation

## 2022-12-06 DIAGNOSIS — E039 Hypothyroidism, unspecified: Secondary | ICD-10-CM | POA: Insufficient documentation

## 2022-12-06 DIAGNOSIS — Z87891 Personal history of nicotine dependence: Secondary | ICD-10-CM | POA: Insufficient documentation

## 2022-12-06 DIAGNOSIS — I5043 Acute on chronic combined systolic (congestive) and diastolic (congestive) heart failure: Secondary | ICD-10-CM | POA: Insufficient documentation

## 2022-12-06 DIAGNOSIS — I130 Hypertensive heart and chronic kidney disease with heart failure and stage 1 through stage 4 chronic kidney disease, or unspecified chronic kidney disease: Secondary | ICD-10-CM | POA: Insufficient documentation

## 2022-12-06 DIAGNOSIS — R627 Adult failure to thrive: Principal | ICD-10-CM

## 2022-12-06 DIAGNOSIS — I2510 Atherosclerotic heart disease of native coronary artery without angina pectoris: Secondary | ICD-10-CM | POA: Insufficient documentation

## 2022-12-06 DIAGNOSIS — N184 Chronic kidney disease, stage 4 (severe): Secondary | ICD-10-CM | POA: Insufficient documentation

## 2022-12-06 DIAGNOSIS — I5022 Chronic systolic (congestive) heart failure: Secondary | ICD-10-CM | POA: Insufficient documentation

## 2022-12-06 DIAGNOSIS — I252 Old myocardial infarction: Secondary | ICD-10-CM | POA: Diagnosis not present

## 2022-12-06 DIAGNOSIS — E162 Hypoglycemia, unspecified: Secondary | ICD-10-CM

## 2022-12-06 DIAGNOSIS — E11649 Type 2 diabetes mellitus with hypoglycemia without coma: Secondary | ICD-10-CM | POA: Insufficient documentation

## 2022-12-06 DIAGNOSIS — E785 Hyperlipidemia, unspecified: Secondary | ICD-10-CM | POA: Insufficient documentation

## 2022-12-06 DIAGNOSIS — E43 Unspecified severe protein-calorie malnutrition: Secondary | ICD-10-CM | POA: Diagnosis present

## 2022-12-06 DIAGNOSIS — I5023 Acute on chronic systolic (congestive) heart failure: Secondary | ICD-10-CM | POA: Diagnosis present

## 2022-12-06 DIAGNOSIS — E1169 Type 2 diabetes mellitus with other specified complication: Secondary | ICD-10-CM | POA: Diagnosis not present

## 2022-12-06 DIAGNOSIS — E1165 Type 2 diabetes mellitus with hyperglycemia: Secondary | ICD-10-CM | POA: Diagnosis present

## 2022-12-06 DIAGNOSIS — Z79899 Other long term (current) drug therapy: Secondary | ICD-10-CM | POA: Diagnosis not present

## 2022-12-06 DIAGNOSIS — E1122 Type 2 diabetes mellitus with diabetic chronic kidney disease: Secondary | ICD-10-CM | POA: Insufficient documentation

## 2022-12-06 DIAGNOSIS — Z955 Presence of coronary angioplasty implant and graft: Secondary | ICD-10-CM | POA: Insufficient documentation

## 2022-12-06 DIAGNOSIS — E1022 Type 1 diabetes mellitus with diabetic chronic kidney disease: Secondary | ICD-10-CM | POA: Insufficient documentation

## 2022-12-06 DIAGNOSIS — E10649 Type 1 diabetes mellitus with hypoglycemia without coma: Secondary | ICD-10-CM | POA: Insufficient documentation

## 2022-12-06 DIAGNOSIS — I5020 Unspecified systolic (congestive) heart failure: Secondary | ICD-10-CM | POA: Diagnosis present

## 2022-12-06 LAB — CBC
HCT: 35.6 % — ABNORMAL LOW (ref 39.0–52.0)
Hemoglobin: 11.3 g/dL — ABNORMAL LOW (ref 13.0–17.0)
MCH: 29.6 pg (ref 26.0–34.0)
MCHC: 31.7 g/dL (ref 30.0–36.0)
MCV: 93.2 fL (ref 80.0–100.0)
Platelets: 290 10*3/uL (ref 150–400)
RBC: 3.82 MIL/uL — ABNORMAL LOW (ref 4.22–5.81)
RDW: 13.1 % (ref 11.5–15.5)
WBC: 8 10*3/uL (ref 4.0–10.5)
nRBC: 0 % (ref 0.0–0.2)

## 2022-12-06 LAB — BASIC METABOLIC PANEL
Anion gap: 6 (ref 5–15)
BUN: 38 mg/dL — ABNORMAL HIGH (ref 8–23)
CO2: 24 mmol/L (ref 22–32)
Calcium: 8.8 mg/dL — ABNORMAL LOW (ref 8.9–10.3)
Chloride: 111 mmol/L (ref 98–111)
Creatinine, Ser: 3.03 mg/dL — ABNORMAL HIGH (ref 0.61–1.24)
GFR, Estimated: 21 mL/min — ABNORMAL LOW (ref >60)
Glucose, Bld: 235 mg/dL — ABNORMAL HIGH (ref 70–99)
Potassium: 4.3 mmol/L (ref 3.5–5.1)
Sodium: 141 mmol/L (ref 135–145)

## 2022-12-06 LAB — TSH: TSH: 12.875 u[IU]/mL — ABNORMAL HIGH (ref 0.350–4.500)

## 2022-12-06 LAB — GLUCOSE, CAPILLARY: Glucose-Capillary: 99 mg/dL (ref 70–99)

## 2022-12-06 LAB — TROPONIN I (HIGH SENSITIVITY)
Troponin I (High Sensitivity): 106 ng/L (ref <18)
Troponin I (High Sensitivity): 123 ng/L (ref <18)

## 2022-12-06 LAB — BRAIN NATRIURETIC PEPTIDE: B Natriuretic Peptide: 1726.4 pg/mL — ABNORMAL HIGH (ref 0.0–100.0)

## 2022-12-06 LAB — CBG MONITORING, ED: Glucose-Capillary: 211 mg/dL — ABNORMAL HIGH (ref 70–99)

## 2022-12-06 MED ORDER — ISOSORBIDE MONONITRATE ER 60 MG PO TB24
60.0000 mg | ORAL_TABLET | Freq: Every day | ORAL | Status: DC
  Administered 2022-12-07 – 2022-12-08 (×2): 60 mg via ORAL
  Filled 2022-12-06 (×2): qty 1

## 2022-12-06 MED ORDER — INSULIN GLARGINE-YFGN 100 UNIT/ML ~~LOC~~ SOLN
20.0000 [IU] | Freq: Every day | SUBCUTANEOUS | Status: DC
Start: 2022-12-06 — End: 2022-12-07
  Filled 2022-12-06 (×3): qty 0.2

## 2022-12-06 MED ORDER — HEPARIN SODIUM (PORCINE) 5000 UNIT/ML IJ SOLN
5000.0000 [IU] | Freq: Three times a day (TID) | INTRAMUSCULAR | Status: DC
  Administered 2022-12-06 – 2022-12-08 (×5): 5000 [IU] via SUBCUTANEOUS
  Filled 2022-12-06 (×5): qty 1

## 2022-12-06 MED ORDER — CLOPIDOGREL BISULFATE 75 MG PO TABS
75.0000 mg | ORAL_TABLET | Freq: Every day | ORAL | Status: DC
  Administered 2022-12-07 – 2022-12-08 (×2): 75 mg via ORAL
  Filled 2022-12-06 (×2): qty 1

## 2022-12-06 MED ORDER — ACETAMINOPHEN 650 MG RE SUPP: 650.0000 mg | Freq: Four times a day (QID) | RECTAL | Status: DC | PRN

## 2022-12-06 MED ORDER — ACETAMINOPHEN 325 MG PO TABS: 650.0000 mg | ORAL_TABLET | Freq: Four times a day (QID) | ORAL | Status: DC | PRN

## 2022-12-06 MED ORDER — ONDANSETRON HCL 4 MG/2ML IJ SOLN: 4.0000 mg | Freq: Four times a day (QID) | INTRAMUSCULAR | Status: DC | PRN

## 2022-12-06 MED ORDER — INSULIN ASPART 100 UNIT/ML IJ SOLN: 0.0000 [IU] | Freq: Every day | INTRAMUSCULAR | Status: DC

## 2022-12-06 MED ORDER — FUROSEMIDE 40 MG PO TABS
40.0000 mg | ORAL_TABLET | Freq: Every day | ORAL | Status: DC
  Administered 2022-12-07 – 2022-12-08 (×2): 40 mg via ORAL
  Filled 2022-12-06 (×2): qty 1

## 2022-12-06 MED ORDER — CARVEDILOL 3.125 MG PO TABS
3.1250 mg | ORAL_TABLET | Freq: Two times a day (BID) | ORAL | Status: DC
  Administered 2022-12-06 – 2022-12-08 (×4): 3.125 mg via ORAL
  Filled 2022-12-06 (×4): qty 1

## 2022-12-06 MED ORDER — INSULIN ASPART 100 UNIT/ML IJ SOLN
0.0000 [IU] | Freq: Three times a day (TID) | INTRAMUSCULAR | Status: DC
  Administered 2022-12-07 (×2): 5 [IU] via SUBCUTANEOUS
  Administered 2022-12-07: 8 [IU] via SUBCUTANEOUS
  Administered 2022-12-08: 2 [IU] via SUBCUTANEOUS

## 2022-12-06 MED ORDER — ATORVASTATIN CALCIUM 80 MG PO TABS
80.0000 mg | ORAL_TABLET | Freq: Every day | ORAL | Status: DC
  Administered 2022-12-07 – 2022-12-08 (×2): 80 mg via ORAL
  Filled 2022-12-06 (×2): qty 1

## 2022-12-06 MED ORDER — HYDRALAZINE HCL 50 MG PO TABS
75.0000 mg | ORAL_TABLET | Freq: Three times a day (TID) | ORAL | Status: DC
  Administered 2022-12-06 – 2022-12-08 (×5): 75 mg via ORAL
  Filled 2022-12-06 (×5): qty 1

## 2022-12-06 MED ORDER — ONDANSETRON HCL 4 MG PO TABS: 4.0000 mg | ORAL_TABLET | Freq: Four times a day (QID) | ORAL | Status: DC | PRN

## 2022-12-06 MED ORDER — PANTOPRAZOLE SODIUM 20 MG PO TBEC
20.0000 mg | DELAYED_RELEASE_TABLET | Freq: Every day | ORAL | Status: DC
  Administered 2022-12-07 – 2022-12-08 (×2): 20 mg via ORAL
  Filled 2022-12-06 (×2): qty 1

## 2022-12-06 NOTE — H&P (Signed)
History and Physical    Colin [name]:[id number] DOB: August 13, 1950 DOA: 12/06/2022  PCP: Grayce Sessions, NP  Patient coming from: Cardiology's office  I have personally briefly reviewed patient's old medical records in [facility]  Chief Complaint: Low blood sugar/poor appetite  HPI: [name] is a 72 y.o. male with medical history significant of systolic CHF, T2DM, hypertension, hyperlipidemia, and CKD stage IV sent to the ED from a cardiology's office today due to hypoglycemia.  Patient actually was admitted for 5 days recently for acute on chronic CHF and discharged on 11/28/2022 patient went to see cardiology for routine visit.  Since discharge, patient has been having poor appetite with low energy.  Went to see cardiology's office today, reportedly, he was hypoglycemic for which she was given Coke to drink.  It is not known as to what exactly his glucose numbers were.  Patient does not know that either.  Nothing documented in the chart either.  Due to lethargy, hypoglycemia and concern for inability to take care of himself, he was advised to come to the emergency department.  Patient himself does not have any complaints such as chest pain, shortness of breath, palpitation, cough, fever, chills, any problem with urination or bowel movement.  Patient further confirms that he eats only 2 meals in a day and eats barely 25 to 30% of the meal.  Please note that patient speaks [place] language and I used in person interpreter.  ED Course: Upon arrival to ED, he was fairly hemodynamically stable.  His creatinine was also around his baseline of 3 and his sugars were 211.  Slightly elevated troponin.  Hospitalist service were consulted for admission.  Review of Systems: As per HPI otherwise negative.    Past Medical History:  Diagnosis Date   Arrhythmia    CAD (coronary artery disease)    stent x 2   Carotid artery occlusion    Chronic combined systolic and diastolic heart  failure (HCC)    Diabetes mellitus    Dyslipidemia    Essential hypertension     Past Surgical History:  Procedure Laterality Date   CARDIAC CATHETERIZATION     01/2010 ; 04/2010   CORONARY ARTERY BYPASS GRAFT N/A 08/23/2020   Procedure: CORONARY ARTERY BYPASS GRAFTING (CABG) x 3 ON CARDIOPULMONARY BYPASS USING EVH AND PARTIALLY OPEN HARVESTED LEFT GREATER SAPHENOUS VEIN AND LEFT INTERNAL MAMMARY ARTERY.  LIMA TO LAD, SVG TO OM, SVG TO PDA;  Surgeon: Corliss Skains, MD;  Location[facility] OR;  Service: Open Heart Surgery;  Laterality: N/A;   RIGHT HEART CATH N/A 12/19/2021   Procedure: RIGHT HEART CATH;  Surgeon: [name], MD;  Location: [facility] INVASIVE CV LAB;  Service: Cardiovascular;  Laterality: N/A;   RIGHT/LEFT HEART CATH AND CORONARY ANGIOGRAPHY N/A 08/22/2020   Procedure: RIGHT/LEFT HEART CATH AND CORONARY ANGIOGRAPHY;  Surgeon: [name], MD;  Location: [facility] INVASIVE CV LAB;  Service: Cardiovascular;  Laterality: N/A;   TEE WITHOUT CARDIOVERSION N/A 08/23/2020   Procedure: TRANSESOPHAGEAL ECHOCARDIOGRAM (TEE);  Surgeon: Corliss Skains, MD;  Location: [facility] OR;  Service: Open Heart Surgery;  Laterality: N/A;   TEMPORARY PACEMAKER N/A 08/22/2020   Procedure: TEMPORARY PACEMAKER;  Surgeon: [name], MD;  Location: [facility] INVASIVE CV LAB;  Service: Cardiovascular;  Laterality: N/A;   VEIN HARVEST Left 08/23/2020   Procedure: ENDOVEIN HARVEST AND PARTIALLY OPEN GREATER SAPHENOUS VEIN HARVESTING;  Surgeon: Corliss Skains, MD;  Location: [facility] OR;  Service: Open  Heart Surgery;  Laterality: Left;     reports that he quit smoking about 2 years ago. His smoking use included cigarettes. He started smoking about 52 years ago. He has a 12.5 pack-year smoking history. He has never used smokeless tobacco. He reports that he does not drink alcohol and does not use drugs.  No Known Allergies  Family History  Problem Relation Age of Onset   Diabetes Neg Hx     Prior  to Admission medications   Medication Sig Start Date End Date Taking? Authorizing Provider  atorvastatin (LIPITOR) 80 MG tablet Take 1 tablet (80 mg total) by mouth daily. 11/28/22  Yes Zannie Cove, MD  carvedilol (COREG) 3.125 MG tablet Take 1 tablet (3.125 mg total) by mouth 2 (two) times daily with a meal. 11/28/22  Yes Zannie Cove, MD  clopidogrel (PLAVIX) 75 MG tablet Take 1 tablet (75 mg total) by mouth daily. 02/07/22  Yes Custovic, Rozell Searing, DO  furosemide (LASIX) 40 MG tablet Take 1 tablet (40 mg total) by mouth daily. 11/29/22  Yes Zannie Cove, MD  hydrALAZINE (APRESOLINE) 25 MG tablet Take 3 tablets (75 mg total) by mouth 3 (three) times daily. 11/28/22  Yes Zannie Cove, MD  insulin isophane & regular human KwikPen (HUMULIN 70/30 KWIKPEN) (70-30) 100 UNIT/ML KwikPen Inject 25 Units into the skin 3 (three) times daily. Patient taking differently: Inject 35 Units into the skin in the morning and at bedtime. 11/28/22  Yes Zannie Cove, MD  isosorbide mononitrate (IMDUR) 60 MG 24 hr tablet Take 1 tablet (60 mg total) by mouth daily. 11/28/22 11/23/23 Yes Zannie Cove, MD  nitroGLYCERIN (NITROSTAT) 0.4 MG SL tablet Place 1 tab (0.4mg  total) under the tongue every 5 minutes as needed for chest pain 02/14/22  Yes Patwardhan, Manish J, MD  pantoprazole (PROTONIX) 20 MG tablet Take 1 tablet (20 mg total) by mouth daily. 02/07/22  Yes Custovic, Rozell Searing, DO  Continuous Glucose Receiver (FREESTYLE LIBRE 3 READER) DEVI Use as directed! 02/06/22   Reather Littler, MD  Continuous Glucose Sensor (FREESTYLE LIBRE 3 SENSOR) MISC Use as instructed change every 14 days 08/01/22   Altamese Halliday, MD  Insulin Pen Needle (PEN NEEDLES 3/16") 31G X 5 MM MISC Uses three times daily with insulin. 05/24/22   Shamleffer, Konrad Dolores, MD    Physical Exam: Vitals:   12/06/22 1236 12/06/22 1517 12/06/22 1600 12/06/22 1711  BP:  (!) 148/63 (!) 163/76   Pulse:  66 71   Resp:  18 18   Temp:  (!) 97.5 F  (36.4 C)  97.6 F (36.4 C)  TempSrc:  Oral  Oral  SpO2:  99% 100%   Weight: 61.9 kg     Height: 5\' 3"  (1.6 m)       Constitutional: NAD, calm, comfortable Vitals:   12/06/22 1236 12/06/22 1517 12/06/22 1600 12/06/22 1711  BP:  (!) 148/63 (!) 163/76   Pulse:  66 71   Resp:  18 18   Temp:  (!) 97.5 F (36.4 C)  97.6 F (36.4 C)  TempSrc:  Oral  Oral  SpO2:  99% 100%   Weight: 61.9 kg     Height: 5\' 3"  (1.6 m)      Eyes: PERRL, lids and conjunctivae normal ENMT: Mucous membranes are moist. Posterior pharynx clear of any exudate or lesions.Normal dentition.  Neck: normal, supple, no masses, no thyromegaly Respiratory: Crackles and possibly rhonchi bilaterally, more on the left side.  Normal respiratory effort. No accessory muscle use.  Cardiovascular: Regular rate and rhythm, no murmurs / rubs / gallops.  +2 pitting edema left lower extremity and +1 pitting edema right lower extremity. 2+ pedal pulses. No carotid bruits.  Abdomen: no tenderness, no masses palpated. No hepatosplenomegaly. Bowel sounds positive.  Musculoskeletal: no clubbing / cyanosis. No joint deformity upper and lower extremities. Good ROM, no contractures. Normal muscle tone.  Skin: no rashes, lesions, ulcers. No induration Neurologic: CN 2-12 grossly intact. Sensation intact, DTR normal. Strength 5/5 in all 4.   Labs on Admission: I have personally reviewed following labs and imaging studies  CBC: Recent Labs  Lab 12/06/22 1301  WBC 8.0  HGB 11.3*  HCT 35.6*  MCV 93.2  PLT 290   Basic Metabolic Panel: Recent Labs  Lab 12/06/22 1301  NA 141  K 4.3  CL 111  CO2 24  GLUCOSE 235*  BUN 38*  CREATININE 3.03*  CALCIUM 8.8*   GFR: Estimated Creatinine Clearance: 17.7 mL/min (A) (by C-G formula based on SCr of 3.03 mg/dL (H)). Liver Function Tests: No results for input(s): "AST", "ALT", "ALKPHOS", "BILITOT", "PROT", "ALBUMIN" in the last 168 hours. No results for input(s): "LIPASE", "AMYLASE" in  the last 168 hours. No results for input(s): "AMMONIA" in the last 168 hours. Coagulation Profile: No results for input(s): "INR", "PROTIME" in the last 168 hours. Cardiac Enzymes: No results for input(s): "CKTOTAL", "CKMB", "CKMBINDEX", "TROPONINI" in the last 168 hours. BNP (last 3 results) No results for input(s): "PROBNP" in the last 8760 hours. HbA1C: No results for input(s): "HGBA1C" in the last 72 hours. CBG: Recent Labs  Lab 12/06/22 1211  GLUCAP 211*   Lipid Profile: No results for input(s): "CHOL", "HDL", "LDLCALC", "TRIG", "CHOLHDL", "LDLDIRECT" in the last 72 hours. Thyroid Function Tests: No results for input(s): "TSH", "T4TOTAL", "FREET4", "T3FREE", "THYROIDAB" in the last 72 hours. Anemia Panel: No results for input(s): "VITAMINB12", "FOLATE", "FERRITIN", "TIBC", "IRON", "RETICCTPCT" in the last 72 hours. Urine analysis:    Component Value Date/Time   COLORURINE YELLOW 01/22/2022 2146   APPEARANCEUR CLEAR 01/22/2022 2146   LABSPEC 1.021 01/22/2022 2146   PHURINE 7.0 01/22/2022 2146   GLUCOSEU >=500 (A) 01/22/2022 2146   HGBUR NEGATIVE 01/22/2022 2146   BILIRUBINUR NEGATIVE 01/22/2022 2146   BILIRUBINUR negative 05/06/2020 1113   KETONESUR NEGATIVE 01/22/2022 2146   PROTEINUR >=300 (A) 01/22/2022 2146   UROBILINOGEN 0.2 05/06/2020 1113   UROBILINOGEN 0.2 01/21/2010 1330   NITRITE NEGATIVE 01/22/2022 2146   LEUKOCYTESUR NEGATIVE 01/22/2022 2146    Radiological Exams on Admission: DG Chest Port 1 View  Result Date: 12/06/2022 CLINICAL DATA:  shortness of breath EXAM: PORTABLE CHEST 1 VIEW COMPARISON:  Chest radiograph dated November 23, 2022 FINDINGS: The heart size and mediastinal contours are unchanged. Prior CABG. Aortic atherosclerosis. Bilateral perihilar vascular congestion and basilar predominant interstitial opacities. Small layering left pleural effusion with improved aeration at the left base. Improved small right pleural effusion. No pneumothorax. No  acute osseous abnormality. IMPRESSION: 1. Cardiomegaly with pulmonary vascular congestion and mild edema. 2. Small left pleural effusion with improved aeration at the left lung base. Electronically Signed   By: Hart Robinsons M.D.   On: 12/06/2022 17:08    EKG: Independently reviewed.  Normal sinus rhythm with no acute ST-T wave changes.  Assessment/Plan Principal Problem:   Failure to thrive in adult Active Problems:   Essential hypertension   CKD (chronic kidney disease) stage 4, GFR 15-29 ml/min (HCC)   Type 2 diabetes mellitus with hyperlipidemia (HCC)  Coronary artery disease   HLD (hyperlipidemia)   HFrEF (heart failure with reduced ejection fraction) (HCC)   S/P CABG x 3   Protein-calorie malnutrition, severe   Uncontrolled type 1 diabetes mellitus with hypoglycemia, with long-term current use of insulin (HCC)   Hypoglycemia in a patient with type 2 diabetes mellitus: Recent hemoglobin A1c 10.8.  PTA, patient on 70/3025 units 3 times daily.  I believe his hypoglycemia is likely in the setting of poor appetite with continuing to take higher dose of insulin.  Although his blood sugar is rising now but to avoid further hypoglycemia, I will give him very little dose of Semglee 20 units at night and continue SSI.  Severe protein calorie malnutrition/poor appetite/failure to thrive: Source unknown.  I will order Ensure.  May need to consider getting dietitian on board in the morning.  Chronic systolic congestive heart failure: Although chest x-ray does show pulmonary edema but patient denies any shortness of breath and he says that his edema is actually much better in the legs.  He is not hypoxic either.  At the time of dictation, BNP is pending.  Patient was discharged on Lasix 40 mg p.o. daily and I will resume that.  I will also resume other medications such as Coreg, hydralazine and Imdur.  Reportedly GDMT was limited due to his CKD.  CKD stage IV: Discharged on hemoglobin of 3, he is  at his baseline currently.  Hyperlipidemia: LDL was over 300 during recent hospitalization, will resume high intensity atorvastatin.  History of CAD/elevated troponin/demand ischemia: Denies any chest pain.  Troponin 106 > 3.  Troponin slightly elevated.  Resume Plavix.  Do not suspect ACS.  Echo was done just 2 weeks ago.  DVT prophylaxis: heparin injection 5,000 Units Start: 12/06/22 2200 Code Status: Full code-confirmed with patient. Family Communication: None present at bedside.  Plan of care discussed with patient in length and he verbalized understanding and agreed with it. Disposition Plan: May discharge in 1 to 2 days. Consults called: None  Hughie Closs MD [facility]  *Please note that this is a verbal dictation therefore any spelling or grammatical errors are due to the "[employer]agon [employer] One" system interpretation.  Please page via Amion and do not message via secure chat for urgent patient care matters. Secure chat can be used for non urgent patient care matters. 12/06/2022, 5:22 PM  To contact the attending provider between 7A-7P or the covering provider during after hours 7P-7A, please log into the web site [email or web address]

## 2022-12-06 NOTE — ED Notes (Signed)
ED TO INPATIENT HANDOFF REPORT  ED Nurse Name and Phone #: Rodney Booze [phone]  S Name/Age/Gender Colin [name] 72 y.o. male Room/Bed: 003C/003C  Code Status   Code Status: Prior  Home/SNF/Other Home Patient oriented to: self, place, time, and situation Is this baseline? Yes   Triage Complete: Triage complete  Chief Complaint Failure to thrive in adult [R62.7]  Triage Note Pt sent by cardiologist office for hypoglycemic and having fatigue.   Allergies No Known Allergies  Level of Care/Admitting Diagnosis ED Disposition     ED Disposition  Admit   Condition  --   Comment  Hospital Area: [facility] [[id number]]  Level of Care: Med-Surg [16]  May admit patient to [facility] or [facility] if equivalent level of care is available:: No  Covid Evaluation: Asymptomatic - no recent exposure (last 10 days) testing not required  Diagnosis: Failure to thrive in adult [358490]  Admitting Physician: Hughie Closs [[id number]]  Attending Physician: Hughie Closs [[id number]]  Certification:: I certify this patient will need inpatient services for at least 2 midnights  Expected Medical Readiness: 12/08/2022          B Medical/Surgery History Past Medical History:  Diagnosis Date   Arrhythmia    CAD (coronary artery disease)    stent x 2   Carotid artery occlusion    Chronic combined systolic and diastolic heart failure (HCC)    Diabetes mellitus    Dyslipidemia    Essential hypertension    Past Surgical History:  Procedure Laterality Date   CARDIAC CATHETERIZATION     01/2010 ; 04/2010   CORONARY ARTERY BYPASS GRAFT N/A 08/23/2020   Procedure: CORONARY ARTERY BYPASS GRAFTING (CABG) x 3 ON CARDIOPULMONARY BYPASS USING EVH AND PARTIALLY OPEN HARVESTED LEFT GREATER SAPHENOUS VEIN AND LEFT INTERNAL MAMMARY ARTERY.  LIMA TO LAD, SVG TO OM, SVG TO PDA;  Surgeon: Corliss Skains, MD;  Location: [facility] OR;  Service: Open Heart Surgery;  Laterality: N/A;   RIGHT HEART  CATH N/A 12/19/2021   Procedure: RIGHT HEART CATH;  Surgeon: [name], MD;  Location: [facility] LAB;  Service: Cardiovascular;  Laterality: N/A;   RIGHT/LEFT HEART CATH AND CORONARY ANGIOGRAPHY N/A 08/22/2020   Procedure: RIGHT/LEFT HEART CATH AND CORONARY ANGIOGRAPHY;  Surgeon: [name], MD;  Location: [facility];  Service: Cardiovascular;  Laterality: N/A;   TEE WITHOUT CARDIOVERSION N/A 08/23/2020   Procedure: TRANSESOPHAGEAL ECHOCARDIOGRAM (TEE);  Surgeon: Corliss Skains, MD;  Location: [facility] OR;  Service: Open Heart Surgery;  Laterality: N/A;   TEMPORARY PACEMAKER N/A 08/22/2020   Procedure: TEMPORARY PACEMAKER;  Surgeon: [name], MD;  Location: [facility];  Service: Cardiovascular;  Laterality: N/A;   VEIN HARVEST Left 08/23/2020   Procedure: ENDOVEIN HARVEST AND PARTIALLY OPEN GREATER SAPHENOUS VEIN HARVESTING;  Surgeon: Corliss Skains, MD;  Location: [facility] OR;  Service: Open Heart Surgery;  Laterality: Left;     A IV Location/Drains/Wounds Patient Lines/Drains/Airways Status     Active Line/Drains/Airways     None            Intake/Output Last 24 hours No intake or output data in the 24 hours ending 12/06/22 1612  Labs/Imaging Results for orders placed or performed during the hospital encounter of 12/06/22 (from the past 48 hour(s))  CBG monitoring, ED     Status: Abnormal   Collection Time: 12/06/22 12:11 PM  Result Value Ref Range   Glucose-Capillary 211 (H) 70 -  99 mg/dL    Comment: Glucose reference range applies only to samples taken after fasting for at least 8 hours.  Basic metabolic panel     Status: Abnormal   Collection Time: 12/06/22  1:01 PM  Result Value Ref Range   Sodium 141 135 - 145 mmol/L   Potassium 4.3 3.5 - 5.1 mmol/L   Chloride 111 98 - 111 mmol/L   CO2 24 22 - 32 mmol/L   Glucose, Bld 235 (H) 70 - 99 mg/dL    Comment: Glucose reference range applies only to samples taken after  fasting for at least 8 hours.   BUN 38 (H) 8 - 23 mg/dL   Creatinine, Ser 0.62 (H) 0.61 - 1.24 mg/dL   Calcium 8.8 (L) 8.9 - 10.3 mg/dL   GFR, Estimated 21 (L) >60 mL/min    Comment: (NOTE) Calculated using the CKD-EPI Creatinine Equation (2021)    Anion gap 6 5 - 15    Comment: Performed at [facility], [address] [place], [place] [address]  CBC     Status: Abnormal   Collection Time: 12/06/22  1:01 PM  Result Value Ref Range   WBC 8.0 4.0 - 10.5 K/uL   RBC 3.82 (L) 4.22 - 5.81 MIL/uL   Hemoglobin 11.3 (L) 13.0 - 17.0 g/dL   HCT 46.2 (L) 70.3 - 50.0 %   MCV 93.2 80.0 - 100.0 fL   MCH 29.6 26.0 - 34.0 pg   MCHC 31.7 30.0 - 36.0 g/dL   RDW 93.8 18.2 - 99.3 %   Platelets 290 150 - 400 K/uL   nRBC 0.0 0.0 - 0.2 %    Comment: Performed at [facility], [address] [place], [place] [address]  Troponin I (High Sensitivity)     Status: Abnormal   Collection Time: 12/06/22  1:01 PM  Result Value Ref Range   Troponin I (High Sensitivity) 106 (HH) <18 ng/L    Comment: CRITICAL RESULT CALLED TO, READ BACK BY AND VERIFIED WITH Ollen Gross, RN [phone] 12/06/22 L. KLAR (NOTE) Elevated high sensitivity troponin I (hsTnI) values and significant  changes across serial measurements may suggest ACS but many other  chronic and acute conditions are known to elevate hsTnI results.  Refer to the "Links" section for chest pain algorithms and additional  guidance. Performed at [facility], [address] [place], [place] [address]    No results found.  Pending Labs Unresulted Labs (From admission, onward)     Start     Ordered   12/06/22 1457  Brain natriuretic peptide  Once,   URGENT        12/06/22 1456            Vitals/Pain Today's Vitals   12/06/22 1210 12/06/22 1236 12/06/22 1517  BP: (!) 115/55  (!) 148/63  Pulse: 62  66  Resp: (!) 22  18  Temp: (!) 97.5 F (36.4 C)  (!) 97.5 F (36.4 C)  TempSrc:   Oral  SpO2: 99%  99%  Weight:  61.9 kg    Height:  5\' 3"  (1.6 m)   PainSc:  0-No pain     Isolation Precautions No active isolations  Medications Medications - No data to display  Mobility walks     Focused Assessments Cardiac Assessment Handoff:    Lab Results  Component Value Date   CKTOTAL 85 01/22/2022   CKMB (HH) 01/22/2010    17.1 CRITICAL VALUE NOTED.  VALUE IS CONSISTENT WITH PREVIOUSLY REPORTED AND CALLED VALUE.   TROPONINI (HH) 01/22/2010    12.43        POSSIBLE MYOCARDIAL ISCHEMIA. SERIAL TESTING RECOMMENDED. CRITICAL VALUE NOTED.  VALUE IS CONSISTENT WITH PREVIOUSLY REPORTED AND CALLED VALUE.   No results found for: "DDIMER" Does the Patient currently have chest pain? No    R Recommendations: See Admitting Provider Note  Report given to:   Additional Notes:

## 2022-12-06 NOTE — ED Provider Notes (Signed)
[place] EMERGENCY DEPARTMENT AT [facility] Provider Note  CSN: [id number] Arrival date & time: 12/06/22 1202  Chief Complaint(s) No chief complaint on file.  HPI Rollo VIDIT BEAUBRUN is a 72 y.o. male history of coronary artery disease, CHF, diabetes, hyperlipidemia, hypertension presenting to the emergency department with weakness.  Patient reports significant weakness and fatigue.  He reports that he is not hungry and not been eating much.  He went to his cardiologist today where they found that he was very hypoglycemic.  It improved with soda and crackers.  The patient reports that he does not really eat much breakfast at all but does continue to give himself his normal insulin dose which is 35 units.  Apparently the paramedics have had to come out of his house once for this already.  He denies any chest pain, shortness of breath, fevers or chills.  No headaches.  Per his cardiologist he is also not really been taking any of his medications.  cardiology sent him to the ER for further evaluation.  Montagnard  interpreter used   Past Medical History Past Medical History:  Diagnosis Date   Arrhythmia    CAD (coronary artery disease)    stent x 2   Carotid artery occlusion    Chronic combined systolic and diastolic heart failure (HCC)    Diabetes mellitus    Dyslipidemia    Essential hypertension    Patient Active Problem List   Diagnosis Date Noted   Failure to thrive in adult 12/06/2022   Hypertensive crisis 11/24/2022   Elevated troponin 11/24/2022   Hypercalcemia 11/24/2022   Epigastric pain 11/24/2022   GERD (gastroesophageal reflux disease) 11/24/2022   CKD (chronic kidney disease) stage 4, GFR 15-29 ml/min (HCC) 11/24/2022   Acute on chronic combined systolic and diastolic CHF (congestive heart failure) (HCC) 11/23/2022   Left upper quadrant abdominal pain 05/30/2022   Injury of right shoulder 03/02/2022   Sinus bradycardia 01/22/2022   Near syncope 01/22/2022    Bradycardia 01/22/2022   COPD with acute exacerbation (HCC) 12/21/2021   Acute on chronic systolic CHF (congestive heart failure) (HCC) 12/16/2021   Acute hypoxic respiratory failure (HCC) 12/16/2021   AKI (acute kidney injury) (HCC) 12/16/2021   Protein-calorie malnutrition, severe 08/29/2020   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   Coronary artery disease 08/23/2020   HFrEF (heart failure with reduced ejection fraction) (HCC)    Sinus pause    Acute pulmonary edema (HCC)    Hyperglycemia    NSTEMI (non-ST elevated myocardial infarction) (HCC)    Shortness of breath    Smoking    Thoracic aorta atherosclerosis (HCC)    Essential hypertension 10/07/2019   Atherosclerosis of native coronary artery of native heart with unstable angina pectoris (HCC) 04/26/2010   Type 2 diabetes mellitus with hyperlipidemia (HCC) 04/26/2010   HLD (hyperlipidemia) 04/26/2010   Home Medication(s) Prior to Admission medications   Medication Sig Start Date End Date Taking? Authorizing Provider  atorvastatin (LIPITOR) 80 MG tablet Take 1 tablet (80 mg total) by mouth daily. 11/28/22   Zannie Cove, MD  carvedilol (COREG) 3.125 MG tablet Take 1 tablet (3.125 mg total) by mouth 2 (two) times daily with a meal. 11/28/22   Zannie Cove, MD  clopidogrel (PLAVIX) 75 MG tablet Take 1 tablet (75 mg total) by mouth daily. 02/07/22   Custovic, Rozell Searing, DO  Continuous Glucose Receiver (FREESTYLE LIBRE 3 READER) DEVI Use as directed! Patient not taking: Reported on 12/06/2022 02/06/22   Reather Littler,  MD  Continuous Glucose Sensor (FREESTYLE LIBRE 3 SENSOR) MISC Use as instructed change every 14 days Patient not taking: Reported on 12/06/2022 08/01/22   Altamese Edgemont, MD  furosemide (LASIX) 40 MG tablet Take 1 tablet (40 mg total) by mouth daily. 11/29/22   Zannie Cove, MD  hydrALAZINE (APRESOLINE) 25 MG tablet Take 3 tablets (75 mg total) by mouth 3 (three) times daily. 11/28/22   Zannie Cove, MD  insulin isophane & regular  human KwikPen (HUMULIN 70/30 KWIKPEN) (70-30) 100 UNIT/ML KwikPen Inject 25 Units into the skin 3 (three) times daily. 11/28/22   Zannie Cove, MD  Insulin Pen Needle (PEN NEEDLES 3/16") 31G X 5 MM MISC Uses three times daily with insulin. 05/24/22   Shamleffer, Konrad Dolores, MD  isosorbide mononitrate (IMDUR) 60 MG 24 hr tablet Take 1 tablet (60 mg total) by mouth daily. 11/28/22 11/23/23  Zannie Cove, MD  nitroGLYCERIN (NITROSTAT) 0.4 MG SL tablet Place 1 tab (0.4mg  total) under the tongue every 5 minutes as needed for chest pain 02/14/22   Patwardhan, Manish J, MD  pantoprazole (PROTONIX) 20 MG tablet Take 1 tablet (20 mg total) by mouth daily. 02/07/22   Clotilde Dieter, DO                                                                                                                                    Past Surgical History Past Surgical History:  Procedure Laterality Date   CARDIAC CATHETERIZATION     01/2010 ; 04/2010   CORONARY ARTERY BYPASS GRAFT N/A 08/23/2020   Procedure: CORONARY ARTERY BYPASS GRAFTING (CABG) x 3 ON CARDIOPULMONARY BYPASS USING EVH AND PARTIALLY OPEN HARVESTED LEFT GREATER SAPHENOUS VEIN AND LEFT INTERNAL MAMMARY ARTERY.  LIMA TO LAD, SVG TO OM, SVG TO PDA;  Surgeon: Corliss Skains, MD;  Location: [facility] OR;  Service: Open Heart Surgery;  Laterality: N/A;   RIGHT HEART CATH N/A 12/19/2021   Procedure: RIGHT HEART CATH;  Surgeon: [name], MD;  [facility]: [facility];  Service: Cardiovascular;  Laterality: N/A;   RIGHT/LEFT HEART CATH AND CORONARY ANGIOGRAPHY N/A 08/22/2020   Procedure: RIGHT/LEFT HEART CATH AND CORONARY ANGIOGRAPHY;  Surgeon: [name], MD;  Location: [facility];  Service: Cardiovascular;  Laterality: N/A;   TEE WITHOUT CARDIOVERSION N/A 08/23/2020   Procedure: TRANSESOPHAGEAL ECHOCARDIOGRAM (TEE);  Surgeon: Corliss Skains, MD;  Location: [facility] OR;  Service: Open Heart Surgery;  Laterality: N/A;   TEMPORARY  PACEMAKER N/A 08/22/2020   Procedure: TEMPORARY PACEMAKER;  Surgeon: [name], MD;  Location: [facility];  Service: Cardiovascular;  Laterality: N/A;   VEIN HARVEST Left 08/23/2020   Procedure: ENDOVEIN HARVEST AND PARTIALLY OPEN GREATER SAPHENOUS VEIN HARVESTING;  Surgeon: Corliss Skains, MD;  Location: [facility] OR;  Service: Open Heart Surgery;  Laterality: Left;   Family History Family History  Problem Relation Age of Onset  Diabetes Neg Hx     Social History Social History   Tobacco Use   Smoking status: Former    Current packs/day: 0.00    Average packs/day: 0.3 packs/day for 50.0 years (12.5 ttl pk-yrs)    Types: Cigarettes    Start date: 08/1970    Quit date: 08/2020    Years since quitting: 2.3   Smokeless tobacco: Never  Vaping Use   Vaping status: Never Used  Substance Use Topics   Alcohol use: No   Drug use: No   Allergies Patient has no known allergies.  Review of Systems Review of Systems  All other systems reviewed and are negative.   Physical Exam Vital Signs  I have reviewed the triage vital signs BP (!) 148/63 (BP Location: Right Arm)   Pulse 66   Temp (!) 97.5 F (36.4 C) (Oral)   Resp 18   Ht 5\' 3"  (1.6 m)   Wt 61.9 kg   SpO2 99%   BMI 24.17 kg/m  Physical Exam Vitals and nursing note reviewed.  Constitutional:      General: He is not in acute distress.    Appearance: Normal appearance.  HENT:     Mouth/Throat:     Mouth: Mucous membranes are moist.  Eyes:     Conjunctiva/sclera: Conjunctivae normal.  Cardiovascular:     Rate and Rhythm: Normal rate and regular rhythm.  Pulmonary:     Effort: Pulmonary effort is normal. No respiratory distress.     Breath sounds: Normal breath sounds.  Abdominal:     General: Abdomen is flat.     Palpations: Abdomen is soft.     Tenderness: There is no abdominal tenderness.  Musculoskeletal:        General: No deformity.     Right lower leg: Edema present.     Left lower  leg: Edema present.  Skin:    General: Skin is warm and dry.     Capillary Refill: Capillary refill takes less than 2 seconds.  Neurological:     Mental Status: He is alert and oriented to person, place, and time. Mental status is at baseline.  Psychiatric:        Mood and Affect: Mood normal.        Behavior: Behavior normal.     ED Results and Treatments Labs (all labs ordered are listed, but only abnormal results are displayed) Labs Reviewed  BASIC METABOLIC PANEL - Abnormal; Notable for the following components:      Result Value   Glucose, Bld 235 (*)    BUN 38 (*)    Creatinine, Ser 3.03 (*)    Calcium 8.8 (*)    GFR, Estimated 21 (*)    All other components within normal limits  CBC - Abnormal; Notable for the following components:   RBC 3.82 (*)    Hemoglobin 11.3 (*)    HCT 35.6 (*)    All other components within normal limits  CBG MONITORING, ED - Abnormal; Notable for the following components:   Glucose-Capillary 211 (*)    All other components within normal limits  TROPONIN I (HIGH SENSITIVITY) - Abnormal; Notable for the following components:   Troponin I (High Sensitivity) 106 (*)    All other components within normal limits  BRAIN NATRIURETIC PEPTIDE  CBG MONITORING, ED  TROPONIN I (HIGH SENSITIVITY)  Radiology No results found.  Pertinent labs & imaging results that were available during my care of the patient were reviewed by me and considered in my medical decision making (see MDM for details).  Medications Ordered in ED Medications - No data to display                                                                                                                                   Procedures Procedures  (including critical care time)  Medical Decision Making / ED Course   MDM:  72 year old presenting to the emergency  department with weakness, hypoglycemia.  Glucose here reassuring.  Suspect patient's hypoglycemic episodes are in the failure of decreased p.o. intake will continue to give himself the same amount of insulin.  He has also had a decline in his renal function recently.  His creatinine went from 2.4-  >3 over the last few checks.  He was hypoglycemic at the cardiologist office today and they wanted him sent over for further evaluation.  He does have some lower extremity swelling, but denies any shortness of breath, lungs are clear on exam.  Patient not hypoxic.  He denies any infectious symptoms to suggest any underlying infectious process.  His troponin is elevated but he denies any chest pain.  His troponin has been elevated previously at a similar level.  His EKG shows chronic left bundle branch block pattern.  Will trend but doubt ACS.  Given leg swelling will obtain BNP although was seen at the cardiologist earlier today and they felt that his symptoms and swelling were not consistent with acute CHF exacerbation.  His recurrent hypoglycemia, in the setting of decreased p.o. intake and recent worsening of kidney function, will admit patient for further evaluation and management.      Additional history obtained: -Additional history obtained from ems -External records from outside source obtained and reviewed including: Chart review including previous notes, labs, imaging, consultation notes including cardiology notes    Lab Tests: -I ordered, reviewed, and interpreted labs.   The pertinent results include:   Labs Reviewed  BASIC METABOLIC PANEL - Abnormal; Notable for the following components:      Result Value   Glucose, Bld 235 (*)    BUN 38 (*)    Creatinine, Ser 3.03 (*)    Calcium 8.8 (*)    GFR, Estimated 21 (*)    All other components within normal limits  CBC - Abnormal; Notable for the following components:   RBC 3.82 (*)    Hemoglobin 11.3 (*)    HCT 35.6 (*)    All other  components within normal limits  CBG MONITORING, ED - Abnormal; Notable for the following components:   Glucose-Capillary 211 (*)    All other components within normal limits  TROPONIN I (HIGH SENSITIVITY) - Abnormal; Notable for the following components:   Troponin I (High Sensitivity) 106 (*)    All  other components within normal limits  BRAIN NATRIURETIC PEPTIDE  CBG MONITORING, ED  TROPONIN I (HIGH SENSITIVITY)    Notable for elevated troponin   EKG   EKG Interpretation Date/Time:  Thursday December 06 2022 12:36:39 EDT Ventricular Rate:  68 PR Interval:  194 QRS Duration:  112 QT Interval:  414 QTC Calculation: 440 R Axis:   9  Text Interpretation: Normal sinus rhythm Incomplete left bundle branch block Left ventricular hypertrophy with repolarization abnormality ( Cornell product ) Abnormal ECG When compared with ECG of 23-Nov-2022 19:39, No significant change since last tracing Confirmed by Alvino Blood ([id number]) on 12/06/2022 3:43:28 PM         Imaging Studies ordered: I ordered imaging studies including CXR On my interpretation imaging demonstrates no significant change from previous  I independently visualized and interpreted imaging. I agree with the radiologist interpretation   Medicines ordered and prescription drug management: No orders of the defined types were placed in this encounter.   -I have reviewed the patients home medicines and have made adjustments as needed   Consultations Obtained: I requested consultation with the hospitalist,  and discussed lab and imaging findings as well as pertinent plan - they recommend: admission   Cardiac Monitoring: The patient was maintained on a cardiac monitor.  I personally viewed and interpreted the cardiac monitored which showed an underlying rhythm of: NSR  Reevaluation: After the interventions noted above, I reevaluated the patient and found that their symptoms have improved  Co morbidities that  complicate the patient evaluation  Past Medical History:  Diagnosis Date   Arrhythmia    CAD (coronary artery disease)    stent x 2   Carotid artery occlusion    Chronic combined systolic and diastolic heart failure (HCC)    Diabetes mellitus    Dyslipidemia    Essential hypertension       Dispostion: Disposition decision including need for hospitalization was considered, and patient admitted to the hospital.    Final Clinical Impression(s) / ED Diagnoses Final diagnoses:  Hypoglycemia     This chart was dictated using voice recognition software.  Despite best efforts to proofread,  errors can occur which can change the documentation meaning.    Lonell Grandchild, MD 12/06/22 [phone]

## 2022-12-06 NOTE — ED Triage Notes (Signed)
Pt sent by cardiologist office for hypoglycemic and having fatigue.

## 2022-12-06 NOTE — Progress Notes (Signed)
ADVANCED HEART FAILURE NEW PATIENT CLINIC NOTE  Referring Physician: Grayce Sessions, NP  Primary Care: Grayce Sessions, NP Primary Cardiologist:  HPI: Colin [name] is a 72 y.o. male with a PMH of  CAD with prior MI, LCX and RCA stents in 2012, s/p CABG X 3. 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     Colin [name] presented to the ED 10/18 with SOB, orthopnea and BLE edema. Denies CP. Reports minimal PO intake, maybe eats once a day, he just doesn't have much of an appetite. Reports taking no oral medications, he only takes his insulin. Does not follow with PCP, he only sees his endocrinologist and last saw cardiology in April. He is very weak at baseline, at home he is only able to walk about 12 feet before he feels tired and his legs give out. He stopped smoking in 2022 after his CABG.  He was admitted in 10/24 for volume overload, worsening renal function, and overall failure to thrive. He was diuresed with some improvement in his renal function, and appetite, but remained fatigued. CVP and Coox were not suggestive or volume overload or low output heart failure.     SUBJECTIVE: Patient feels very poorly in clinic today. His appetitie has worsened, he has had trouble keeping food down, and his blood sugar has been low multiple times, requiring EMS to come out once prior. He reports compliance with his medications, BP is lower than baseline in clinic today. Denies worsening shortness of breath, chest pain, abdominal swelling/distension, orthopnea, fever, chills.  PMH, current medications, allergies, social history, and family history reviewed in epic.  PHYSICAL EXAM: Vitals:   12/06/22 1131  BP: (!) 90/50  Pulse: 64  SpO2: 94%   General: ill appearing HEENT: Normal.   Lungs:  Clear to auscultation bilaterally with normal respiratory effort. CV: Heart regular S1/S2, trace LE edema, normal JVP Abdomen: Soft, no distention.  Skin: Intact without lesions or rashes.  Neurologic: awake/alert, no gross FND.  Psych: Normal affect. Extremities: No clubbing or cyanosis.    ASSESSMENT & PLAN:  Failure to thrive: Patient and family report worsening activity level, oral intake, and blood sugars at home. He denies any overt heart failure symptoms. Grandson also noted to have a viral illness with emesis in clinic with him today. I am concerned that with his likely worsening renal function, poor functional status, and frequent hypoglycemic episodes he is not safe to return home. BS improved to 219 with soda in clinic today, but will send to the ED for further evaluation and management. - ED transfer - Will likely need reduction in Bidil and insulin - Family member with gastroenteritis - Labs per ED, but worry that his renal function is worsening - Poor dialysis candidate, not an advanced therapies candidate, palliative care consultation is appropriate if clear reversible cause to decompensation is not found  Chronic systolic heart failure: Does not appear low output or volume overloaded today. Was hypertensive in the hospital, hypotensive today. - Decrease hydralazine/isordil - Holding diuretics currently given poor PO intake  CAD:  - Continue plavix, atorvastatin 80mg  daily  HLD: Severely elevated LDL, if significant improvement in function would likely require a PCSK9, but worry that between his renal function and severe heart failure is most likely heading towards a more palliative route.  Colin Hasten, MD Advanced Heart Failure Mechanical Circulatory Support 12/06/22

## 2022-12-07 DIAGNOSIS — E162 Hypoglycemia, unspecified: Principal | ICD-10-CM | POA: Insufficient documentation

## 2022-12-07 DIAGNOSIS — I2510 Atherosclerotic heart disease of native coronary artery without angina pectoris: Secondary | ICD-10-CM

## 2022-12-07 DIAGNOSIS — N184 Chronic kidney disease, stage 4 (severe): Secondary | ICD-10-CM

## 2022-12-07 DIAGNOSIS — E43 Unspecified severe protein-calorie malnutrition: Secondary | ICD-10-CM

## 2022-12-07 DIAGNOSIS — E10649 Type 1 diabetes mellitus with hypoglycemia without coma: Secondary | ICD-10-CM

## 2022-12-07 DIAGNOSIS — I5023 Acute on chronic systolic (congestive) heart failure: Secondary | ICD-10-CM

## 2022-12-07 DIAGNOSIS — I10 Essential (primary) hypertension: Secondary | ICD-10-CM

## 2022-12-07 LAB — BASIC METABOLIC PANEL
Anion gap: 7 (ref 5–15)
BUN: 36 mg/dL — ABNORMAL HIGH (ref 8–23)
CO2: 22 mmol/L (ref 22–32)
Calcium: 8.1 mg/dL — ABNORMAL LOW (ref 8.9–10.3)
Chloride: 110 mmol/L (ref 98–111)
Creatinine, Ser: 2.78 mg/dL — ABNORMAL HIGH (ref 0.61–1.24)
GFR, Estimated: 23 mL/min — ABNORMAL LOW (ref >60)
Glucose, Bld: 272 mg/dL — ABNORMAL HIGH (ref 70–99)
Potassium: 3.8 mmol/L (ref 3.5–5.1)
Sodium: 139 mmol/L (ref 135–145)

## 2022-12-07 LAB — CBC
HCT: 31.7 % — ABNORMAL LOW (ref 39.0–52.0)
Hemoglobin: 10.5 g/dL — ABNORMAL LOW (ref 13.0–17.0)
MCH: 30.3 pg (ref 26.0–34.0)
MCHC: 33.1 g/dL (ref 30.0–36.0)
MCV: 91.4 fL (ref 80.0–100.0)
Platelets: 264 10*3/uL (ref 150–400)
RBC: 3.47 MIL/uL — ABNORMAL LOW (ref 4.22–5.81)
RDW: 13.1 % (ref 11.5–15.5)
WBC: 6.8 10*3/uL (ref 4.0–10.5)
nRBC: 0 % (ref 0.0–0.2)

## 2022-12-07 LAB — GLUCOSE, CAPILLARY
Glucose-Capillary: 235 mg/dL — ABNORMAL HIGH (ref 70–99)
Glucose-Capillary: 254 mg/dL — ABNORMAL HIGH (ref 70–99)
Glucose-Capillary: 214 mg/dL — ABNORMAL HIGH (ref 70–99)
Glucose-Capillary: 176 mg/dL — ABNORMAL HIGH (ref 70–99)

## 2022-12-07 MED ORDER — INSULIN GLARGINE-YFGN 100 UNIT/ML ~~LOC~~ SOLN
15.0000 [IU] | Freq: Every day | SUBCUTANEOUS | Status: DC
Start: 2022-12-07 — End: 2022-12-08
  Administered 2022-12-07: 15 [IU] via SUBCUTANEOUS
  Filled 2022-12-07 (×2): qty 0.15

## 2022-12-07 MED ORDER — INSULIN ASPART 100 UNIT/ML IJ SOLN
3.0000 [IU] | Freq: Three times a day (TID) | INTRAMUSCULAR | Status: DC
  Administered 2022-12-08: 3 [IU] via SUBCUTANEOUS

## 2022-12-07 NOTE — Assessment & Plan Note (Signed)
Continue blood pressure control with hydralazine and isosorbide.  Carvedilol.  Diuresis with furosemide.

## 2022-12-07 NOTE — Hospital Course (Addendum)
Colin [name] was admitted to the hospital with the working diagnosis of hypoglycemia.   72 yo male with the past medical history of heart failure, T2DM, CKD stage IV, and hyperlipidemia who presented with hypoglycemia and poor appetite. Recent hospitalization for heart failure 10/18 to 11/28/22. Patient at home had low appetite and worsening fatigue. Follow up at the cardiology clinic on 12/06/22 found hypoglycemic and was referred to the ED for further evaluation.  Hid blood pressure was 148/63, HR 66, RR 18 and 02 saturation 99%, lungs with rales and rhonchi bilaterally, heart with S1 and S2 present and regular, abdomen with no distention and positive lower extremity edema.   Na 141, K 4,3 Cl 111, bicarbonate 24, glucose 235, bun 38 cr 3,0  BNP 1,726  High sensitive troponin 106 and 123.  Wbc 8,0 hgb 11,3 plt 290 TSH 12.8   Chest radiograph with cardiomegaly, with bilateral hilar vascular congestion with no infiltrates.   EKG 68 bpm, normal axis, normal intervals, sinus rhythm with J point elevation V1 to V3, T wave inversion lead I, AvL, III, II, Avf, positive LVH, (chronic changes).   Patient was placed on insulin therapy long acting and sliding scale short acting with good toleration. Plan to continue lower dose of insulin at home.  Follow up with Endocrinology as outpatient.

## 2022-12-07 NOTE — Assessment & Plan Note (Signed)
No signs of decompensation,   Plan to continue with isosorbide, hydralazine and carvedilol.  Diuresis with furosemide 40 mg po daily.

## 2022-12-07 NOTE — Assessment & Plan Note (Signed)
Continue with nutritional supplements.  

## 2022-12-07 NOTE — Assessment & Plan Note (Signed)
Renal function with serum cr at 2,7 with K at 3,8 and serum bicarbonate at 22.  Na 139  Plan to follow up renal function in am.

## 2022-12-07 NOTE — Progress Notes (Addendum)
Transition of Care [facility]) - Inpatient Brief Assessment   Patient Details  Name: ITAI BARBIAN MRN: [id number] Date of Birth: May 18, 1950  [facility] of [facility] CM/SW Contact:    Janae Bridgeman, RN Phone Number: 12/07/2022, 4:35 PM   Clinical Narrative: Cm met with the patient at the bedside.  No family in the room at this time.  Medicare Observation letter provided at the bedside.  The patient's claim was changed to observation and Code 44 was completed.  I spoke with Dixie Dials, UR RN and she is aware.  Claim information was updated.  No other TOC needs at this time.   Transition of Care Asessment: Insurance and Status: (P) Insurance coverage has been reviewed Patient has primary care physician: (P) Yes Home environment has been reviewed: (P) From home with family Prior level of function:: (P) Independent Prior/Current Home Services: (P) No current home services Social Determinants of Health Reivew: (P) SDOH reviewed interventions complete Readmission risk has been reviewed: (P) Yes Transition of care needs: (P) no transition of care needs at this time

## 2022-12-07 NOTE — Assessment & Plan Note (Signed)
Sp coronary artery bypass grafting No chest pain.  Continue antiplatelet therapy and statin.

## 2022-12-07 NOTE — Inpatient Diabetes Management (Signed)
Inpatient Diabetes Program Recommendations  AACE/ADA: New Consensus Statement on Inpatient Glycemic Control (2015)  Target Ranges:  Prepandial:   less than 140 mg/dL      Peak postprandial:   less than 180 mg/dL (1-2 hours)      Critically ill patients:  140 - 180 mg/dL   Lab Results  Component Value Date   GLUCAP 235 (H) 12/07/2022   HGBA1C 10.8 (H) 11/24/2022    Review of Glycemic Control  Diabetes history: DM 2 Outpatient Diabetes medications: discharged on 70/30 25 units tid on 10/23 Current orders for Inpatient glycemic control: Semglee 20 units Novolog 0-15 units tid + hs  A1c 10.8% on 10/19, DM Coordinator spoke with pt on 10/22  Inpatient Diabetes Program Recommendations:    -   Reduce Semglee to 15 units  Based on poor po intake, 70/30 may not be the best option for glucose control at home and may need a different insulin that does not include meal coverage insulin component. Watch trends for now.  Thanks,  Christena Deem RN, MSN, BC-ADM Inpatient Diabetes Coordinator Team Pager [phone] (8a-5p)

## 2022-12-07 NOTE — Progress Notes (Addendum)
  Progress Note   Patient: Colin [name] FUX:[id number] DOB: January 18, 1951 DOA: 12/06/2022     1 DOS: the patient was seen and examined on 12/07/2022   Brief hospital course: [name] was admitted to the hospital with the working diagnosis of hypoglycemia.   72 yo male with the past medical history of heart failure, T2DM, CKD stage IV, and hyperlipidemia who presented with hypoglycemia and poor appetite. Recent hospitalization for heart failure 10/18 to 11/28/22. Patient at home had low appetite and worsening fatigue. Follow up at the cardiology clinic on 12/06/22 found hypoglycemic and was referred to the ED for further evaluation.  Hid blood pressure was 148/63, HR 66, RR 18 and 02 saturation 99%, lungs with rales and rhonchi bilaterally, heart with S1 and S2 present and regular, abdomen with no distention and positive lower extremity edema.   Na 141, K 4,3 Cl 111, bicarbonate 24, glucose 235, bun 38 cr 3,0    Assessment and Plan: * Type 2 diabetes mellitus with hyperlipidemia (HCC) Fasting glucose this am 272,  Capillary 235, 254 and 215.   Plan to continue insulin coverage with basal insulin 15 units and sliding scale.  Add pre meal insulin 3 units aspart Follow up glucose in am.  Patient is tolerating po well.  (Reported 70/30, 25 units tid).   Dyslipidemia. Continue with statin therapy.   Acute on chronic systolic CHF (congestive heart failure) (HCC) No signs of decompensation,   Plan to continue with isosorbide, hydralazine and carvedilol.  Diuresis with furosemide 40 mg po daily.   Essential hypertension Continue blood pressure control with hydralazine and isosorbide.  Carvedilol.  Diuresis with furosemide.   CKD (chronic kidney disease) stage 4, GFR 15-29 ml/min (HCC) Renal function with serum cr at 2,7 with K at 3,8 and serum bicarbonate at 22.  Na 139  Plan to follow up renal function in am.   Coronary artery disease Sp coronary artery bypass grafting No chest pain.   Continue antiplatelet therapy and statin.   Protein-calorie malnutrition, severe Continue with nutritional supplements.         Subjective: patient is feeling better, his glucose has improved and tolerating po well.    Physical Exam: Vitals:   12/07/22 0444 12/07/22 0500 12/07/22 0718 12/07/22 1502  BP: (!) 175/77  (!) 164/72 (!) 157/67  Pulse: 81  73 72  Resp: 18  17 17   Temp: 98.2 F (36.8 C)  98.1 F (36.7 C) (!) 97.3 F (36.3 C)  TempSrc: Oral  Oral Oral  SpO2: 98%  97% 97%  Weight:  65.7 kg    Height:       Neurology awake and alert ENT with mild pallor Cardiovascular with S1 and S2 present and regular with no gallops, rubs or murmurs No JVD Respiratory with no rales or wheezing  Abdomen with no distention  Trace lower extremity edema more on the left   Data Reviewed:    Family Communication: no family at the bedside   Disposition: Status is: Observation The patient remains OBS appropriate and will d/c before 2 midnights.  Planned Discharge Destination: Home    Author: Coralie Keens, MD 12/07/2022 5:20 PM  For on call review [email or web address].

## 2022-12-07 NOTE — Assessment & Plan Note (Signed)
Patient feeling better.  His fasting glucose today is 150 mg/dl and is capillary glucose has been 214, 176 and 143.   Plan to continue insulin coverage with basal insulin 15 units and sliding scale.  Pre meal insulin 3 units aspart  (At home patient on 70/30, 25 units tid).   Dyslipidemia. Continue with statin therapy.

## 2022-12-07 NOTE — Progress Notes (Signed)
Heart Failure Navigator Progress Note  Assessed for Heart & Vascular TOC clinic readiness.  Patient does not meet criteria due to Advanced Heart Failure Team patient of Dr. Clearnce Hasten.   Navigator will sign off at this time.  Roxy Horseman, RN, BSN [facility] Heart Failure Navigator Secure Chat Only

## 2022-12-07 NOTE — Care Management Obs Status (Cosign Needed)
MEDICARE OBSERVATION STATUS NOTIFICATION   Patient Details  Name: Colin [name] MRN: [id number] Date of Birth: [birth year]   Medicare Observation Status Notification Given:  Yes    Janae Bridgeman, RN 12/07/2022, 4:30 PM

## 2022-12-07 NOTE — Plan of Care (Signed)

## 2022-12-07 NOTE — Assessment & Plan Note (Deleted)
Fasting glucose this am 272,  Capillary 235, 254 and 215.   Plan to continue insulin coverage with basal insulin 15 units and sliding scale.  Add pre meal insulin 3 units aspart Follow up glucose in am.  Patient is tolerating po well.  (Reported 70/30, 25 units tid).   Dyslipidemia. Continue with statin therapy.

## 2022-12-08 ENCOUNTER — Other Ambulatory Visit (HOSPITAL_COMMUNITY): Payer: Self-pay

## 2022-12-08 DIAGNOSIS — N184 Chronic kidney disease, stage 4 (severe): Secondary | ICD-10-CM | POA: Diagnosis not present

## 2022-12-08 DIAGNOSIS — E785 Hyperlipidemia, unspecified: Secondary | ICD-10-CM

## 2022-12-08 DIAGNOSIS — E1169 Type 2 diabetes mellitus with other specified complication: Principal | ICD-10-CM

## 2022-12-08 DIAGNOSIS — I10 Essential (primary) hypertension: Secondary | ICD-10-CM | POA: Diagnosis not present

## 2022-12-08 DIAGNOSIS — I5023 Acute on chronic systolic (congestive) heart failure: Secondary | ICD-10-CM | POA: Diagnosis not present

## 2022-12-08 LAB — T4, FREE: Free T4: 0.89 ng/dL (ref 0.61–1.12)

## 2022-12-08 LAB — BASIC METABOLIC PANEL
Anion gap: 8 (ref 5–15)
BUN: 35 mg/dL — ABNORMAL HIGH (ref 8–23)
CO2: 23 mmol/L (ref 22–32)
Calcium: 8.1 mg/dL — ABNORMAL LOW (ref 8.9–10.3)
Chloride: 109 mmol/L (ref 98–111)
Creatinine, Ser: 2.72 mg/dL — ABNORMAL HIGH (ref 0.61–1.24)
GFR, Estimated: 24 mL/min — ABNORMAL LOW (ref >60)
Glucose, Bld: 150 mg/dL — ABNORMAL HIGH (ref 70–99)
Potassium: 3.6 mmol/L (ref 3.5–5.1)
Sodium: 140 mmol/L (ref 135–145)

## 2022-12-08 LAB — GLUCOSE, CAPILLARY
Glucose-Capillary: 143 mg/dL — ABNORMAL HIGH (ref 70–99)
Glucose-Capillary: 175 mg/dL — ABNORMAL HIGH (ref 70–99)

## 2022-12-08 MED ORDER — INSULIN LISPRO (1 UNIT DIAL) 100 UNIT/ML (KWIKPEN)
3.0000 [IU] | PEN_INJECTOR | Freq: Three times a day (TID) | SUBCUTANEOUS | 0 refills | Status: DC
  Filled 2022-12-08: qty 3, 34d supply, fill #0

## 2022-12-08 MED ORDER — INSULIN GLARGINE 100 UNIT/ML SOLOSTAR PEN
15.0000 [IU] | PEN_INJECTOR | Freq: Every day | SUBCUTANEOUS | 11 refills | Status: DC
  Filled 2022-12-08: qty 15, 100d supply, fill #0

## 2022-12-08 MED ORDER — INSULIN ASPART (W/NIACINAMIDE) 100 UNIT/ML ~~LOC~~ SOPN
3.0000 [IU] | PEN_INJECTOR | Freq: Three times a day (TID) | SUBCUTANEOUS | 0 refills | Status: DC
Start: 2022-12-08 — End: 2022-12-08
  Filled 2022-12-08: qty 3, 30d supply, fill #0

## 2022-12-08 MED ORDER — LEVOTHYROXINE SODIUM 25 MCG PO TABS
25.0000 ug | ORAL_TABLET | Freq: Every day | ORAL | 0 refills | Status: DC
  Filled 2022-12-08: qty 30, 30d supply, fill #0

## 2022-12-08 MED ORDER — LEVOTHYROXINE SODIUM 25 MCG PO TABS
25.0000 ug | ORAL_TABLET | Freq: Every day | ORAL | Status: DC
  Administered 2022-12-08: 25 ug via ORAL
  Filled 2022-12-08: qty 1

## 2022-12-08 MED ORDER — INSULIN PEN NEEDLE 32G X 4 MM MISC
0 refills | Status: AC
  Filled 2022-12-08: qty 100, 30d supply, fill #0

## 2022-12-08 NOTE — Discharge Summary (Addendum)
Physician Discharge Summary   Patient: Colin [name] MRN: [id number] DOB: Jul 17, 1950  Admit date:     12/06/2022  Discharge date: 12/08/22  Discharge Physician: York Ram Chevie Birkhead   PCP: Grayce Sessions, NP   Recommendations at discharge:    Insulin regimen has been changes to basal insulin 15 units and pre meal 3 units.  Added levothyroxine for hypothyroidism, follow up on free T4 and total t3. Follow up with Fernande Boyden NP in 7 to 10 days. Follow up with Endocrinology and Cardiology as scheduled.   I have called Mr. Mooty son, but unable to reach him over the phone, left a message.  Mr. Theisen understands medication changes and further instructions for follow up.   Discharge Diagnoses: Principal Problem:   Type 2 diabetes mellitus with hyperlipidemia (HCC) Active Problems:   Acute on chronic systolic CHF (congestive heart failure) (HCC)   Essential hypertension   CKD (chronic kidney disease) stage 4, GFR 15-29 ml/min (HCC)   Coronary artery disease   Protein-calorie malnutrition, severe  Resolved Problems:   * No resolved hospital problems. [facility] Course: Mr. Raczka was admitted to the hospital with the working diagnosis of hypoglycemia.   72 yo male with the past medical history of heart failure, T2DM, CKD stage IV, and hyperlipidemia who presented with hypoglycemia and poor appetite. Recent hospitalization for heart failure 10/18 to 11/28/22. Patient at home had low appetite and worsening fatigue. Follow up at the cardiology clinic on 12/06/22 found hypoglycemic and was referred to the ED for further evaluation.  Hid blood pressure was 148/63, HR 66, RR 18 and 02 saturation 99%, lungs with rales and rhonchi bilaterally, heart with S1 and S2 present and regular, abdomen with no distention and positive lower extremity edema.   Na 141, K 4,3 Cl 111, bicarbonate 24, glucose 235, bun 38 cr 3,0  BNP 1,726  High sensitive troponin 106 and 123.  Wbc 8,0 hgb 11,3 plt  290 TSH 12.8   Chest radiograph with cardiomegaly, with bilateral hilar vascular congestion with no infiltrates.   EKG 68 bpm, normal axis, normal intervals, sinus rhythm with J point elevation V1 to V3, T wave inversion lead I, AvL, III, II, Avf, positive LVH, (chronic changes).   Patient was placed on insulin therapy long acting and sliding scale short acting with good toleration. Plan to continue lower dose of insulin at home.  Follow up with Endocrinology as outpatient.   Assessment and Plan: * Type 2 diabetes mellitus with hyperlipidemia (HCC) Patient feeling better.  His fasting glucose today is 150 mg/dl and is capillary glucose has been 214, 176 and 143.   Plan to continue insulin coverage with basal insulin 15 units and sliding scale.  Pre meal insulin 3 units aspart  (At home patient on 70/30, 25 units tid).   Dyslipidemia. Continue with statin therapy.   Acute on chronic systolic CHF (congestive heart failure) (HCC) Echocardiogram with reduced LV systolic function with EF 25 to 30%, global hypokinesis, mild LVH of the lateral segment, RV systolic function is preserved, LA with mid to moderate dilatation, mitral valve with mild to moderate regurgitation, no pericardial effusion.   Patient remained euvolemic.  Plan to continue with isosorbide, hydralazine and carvedilol.  Diuresis with furosemide 40 mg po daily.   Elevated high sensitive troponin, but ruled out acute coronary syndrome.   Essential hypertension Continue blood pressure control with hydralazine and isosorbide.  Carvedilol.  Diuresis with furosemide.   CKD (chronic kidney disease)  stage 4, GFR 15-29 ml/min (HCC) At the time of his discharge his renal function has remained stable with serum cr at 2,7 with K at 3,6 and serum bicarbonate at 23. Na 140.  Continue furosemide and follow up renal function as outpatient.   Coronary artery disease Sp coronary artery bypass grafting No chest pain.  Continue  antiplatelet therapy and statin.   Protein-calorie malnutrition, severe Continue with nutritional supplements.          Consultants: none  Procedures performed: none   Disposition: Home Diet recommendation:  Cardiac and Carb modified diet DISCHARGE MEDICATION: Allergies as of 12/08/2022   No Known Allergies      Medication List     STOP taking these medications    HumuLIN 70/30 KwikPen (70-30) 100 UNIT/ML KwikPen Generic drug: insulin isophane & regular human KwikPen       TAKE these medications    atorvastatin 80 MG tablet Commonly known as: LIPITOR Take 1 tablet (80 mg total) by mouth daily.   carvedilol 3.125 MG tablet Commonly known as: COREG Take 1 tablet (3.125 mg total) by mouth 2 (two) times daily with a meal.   clopidogrel 75 MG tablet Commonly known as: Plavix Take 1 tablet (75 mg total) by mouth daily.   FreeStyle Tangent 3 Reader [place] Use as directed!   FreeStyle Libre 3 Sensor Misc Use as instructed change every 14 days   furosemide 40 MG tablet Commonly known as: LASIX Take 1 tablet (40 mg total) by mouth daily.   hydrALAZINE 25 MG tablet Commonly known as: APRESOLINE Take 3 tablets (75 mg total) by mouth 3 (three) times daily.   insulin glargine 100 UNIT/ML Solostar Pen Commonly known as: LANTUS Inject 15 Units into the skin at bedtime.   insulin lispro 100 UNIT/ML KwikPen Commonly known as: Admelog SoloStar Inject 3 Units into the skin 3 (three) times daily with meals.   isosorbide mononitrate 60 MG 24 hr tablet Commonly known as: IMDUR Take 1 tablet (60 mg total) by mouth daily.   levothyroxine 25 MCG tablet Commonly known as: SYNTHROID Take 1 tablet (25 mcg total) by mouth daily at 6 (six) AM.   nitroGLYCERIN 0.4 MG SL tablet Commonly known as: NITROSTAT Place 1 tab (0.4mg  total) under the tongue every 5 minutes as needed for chest pain   pantoprazole 20 MG tablet Commonly known as: PROTONIX Take 1 tablet (20 mg total)  by mouth daily.   TechLite Pen Needles 31G X 5 MM Misc Generic drug: Insulin Pen Needle Uses three times daily with insulin. What changed: Another medication with the same name was added. Make sure you understand how and when to take each.   Insulin Pen Needle 32G X 4 MM Misc Use as directed with Humalog and Lantus pens. What changed: You were already taking a medication with the same name, and this prescription was added. Make sure you understand how and when to take each.        Discharge Exam: Filed Weights   12/06/22 1236 12/07/22 0500 12/08/22 0433  Weight: 61.9 kg 65.7 kg 66.1 kg   BP 129/65 (BP Location: Left Arm)   Pulse 72   Temp 98.2 F (36.8 C) (Oral)   Resp 18   Ht 5\' 3"  (1.6 m)   Wt 66.1 kg   SpO2 96%   BMI 25.81 kg/m   Patient with feeling better, no chest pain, no dyspnea, no PND or orthopnea.   Neurology awake and alert ENT with mild  pallor Cardiovascular with S1 and S2 present and regular, no gallops, rubs or murmurs No JVD Trace lower extremity edema Respiratory with no rales or wheezing, no rhonchi Abdomen with no distention   Condition at discharge: stable  The results of significant diagnostics from this hospitalization (including imaging, microbiology, ancillary and laboratory) are listed below for reference.   Imaging Studies: DG Chest Port 1 View  Result Date: 12/06/2022 CLINICAL DATA:  shortness of breath EXAM: PORTABLE CHEST 1 VIEW COMPARISON:  Chest radiograph dated November 23, 2022 FINDINGS: The heart size and mediastinal contours are unchanged. Prior CABG. Aortic atherosclerosis. Bilateral perihilar vascular congestion and basilar predominant interstitial opacities. Small layering left pleural effusion with improved aeration at the left base. Improved small right pleural effusion. No pneumothorax. No acute osseous abnormality. IMPRESSION: 1. Cardiomegaly with pulmonary vascular congestion and mild edema. 2. Small left pleural effusion with  improved aeration at the left lung base. Electronically Signed   By: Hart Robinsons M.D.   On: 12/06/2022 17:08   [place] EKG SITE RITE  Result Date: 11/26/2022 If Site Rite image not attached, placement could not be confirmed due to current cardiac rhythm.  ECHOCARDIOGRAM COMPLETE  Result Date: 11/24/2022    ECHOCARDIOGRAM REPORT   Patient Name:   ARON INGE [facility] Date of Exam: 11/24/2022 Medical Rec #:  [id number]    Height:       63.0 in Accession #:    [id number]   Weight:       140.0 lb Date of Birth:  April 22, 1950    BSA:          1.662 m Patient Age:    72 years     BP:           157/77 mmHg Patient Gender: M            HR:           84 bpm. Exam Location:  Inpatient Procedure: 2D Echo, Color Doppler and Cardiac Doppler Indications:    acute systolic chf  History:        Patient has prior history of Echocardiogram examinations, most                 recent 12/17/2021. CAD, Prior CABG, chronic kidney disease and                 COPD; Risk Factors:Diabetes, Hypertension, Dyslipidemia and                 Former Smoker.  Sonographer:    [name] RDCS Referring Phys: [id number] JUSTIN B HOWERTER IMPRESSIONS  1. Left ventricular ejection fraction, by estimation, is 25 to 30%. The left ventricle has severely decreased function. The left ventricle demonstrates global hypokinesis. There is mild asymmetric left ventricular hypertrophy of the lateral segment. Left ventricular diastolic parameters are consistent with Grade I diastolic dysfunction (impaired relaxation). Elevated left ventricular end-diastolic pressure.  2. Right ventricular systolic function is normal. The right ventricular size is normal. Tricuspid regurgitation signal is inadequate for assessing PA pressure.  3. Left atrial size was mild to moderately dilated.  4. Large pleural effusion in the left lateral region.  5. The mitral valve is normal in structure. Mild to moderate mitral valve regurgitation. No evidence of mitral stenosis.  6. The aortic  valve is tricuspid. There is moderate calcification of the aortic valve. Aortic valve regurgitation is not visualized. No aortic stenosis is present.  7. The inferior vena cava is normal in size with  greater than 50% respiratory variability, suggesting right atrial pressure of 3 mmHg. Comparison(s): No significant change from prior study. FINDINGS  Left Ventricle: Left ventricular ejection fraction, by estimation, is 25 to 30%. The left ventricle has severely decreased function. The left ventricle demonstrates global hypokinesis. The left ventricular internal cavity size was normal in size. There is mild asymmetric left ventricular hypertrophy of the lateral segment. Left ventricular diastolic parameters are consistent with Grade I diastolic dysfunction (impaired relaxation). Elevated left ventricular end-diastolic pressure. Right Ventricle: The right ventricular size is normal. No increase in right ventricular wall thickness. Right ventricular systolic function is normal. Tricuspid regurgitation signal is inadequate for assessing PA pressure. Left Atrium: Left atrial size was mild to moderately dilated. Right Atrium: Right atrial size was normal in size. Pericardium: There is no evidence of pericardial effusion. Mitral Valve: The mitral valve is normal in structure. Mild to moderate mitral valve regurgitation. No evidence of mitral valve stenosis. Tricuspid Valve: The tricuspid valve is normal in structure. Tricuspid valve regurgitation is not demonstrated. No evidence of tricuspid stenosis. Aortic Valve: The aortic valve is tricuspid. There is moderate calcification of the aortic valve. Aortic valve regurgitation is not visualized. No aortic stenosis is present. Pulmonic Valve: The pulmonic valve was normal in structure. Pulmonic valve regurgitation is not visualized. No evidence of pulmonic stenosis. Aorta: The aortic root is normal in size and structure. Venous: The inferior vena cava is normal in size with  greater than 50% respiratory variability, suggesting right atrial pressure of 3 mmHg. IAS/Shunts: No atrial level shunt detected by color flow Doppler. Additional Comments: There is a large pleural effusion in the left lateral region.  LEFT VENTRICLE PLAX 2D LVIDd:         5.00 cm      Diastology LVIDs:         4.30 cm      LV e' medial:    3.15 cm/s LV PW:         1.20 cm      LV E/e' medial:  19.6 LV IVS:        1.00 cm      LV e' lateral:   4.03 cm/s LVOT diam:     2.00 cm      LV E/e' lateral: 15.3 LV SV:         49 LV SV Index:   29 LVOT Area:     3.14 cm  LV Volumes (MOD) LV vol d, MOD A2C: 128.0 ml LV vol d, MOD A4C: 112.0 ml LV vol s, MOD A2C: 97.4 ml LV SV MOD A2C:     30.6 ml LV SV MOD A4C:     112.0 ml RIGHT VENTRICLE            IVC RV Basal diam:  2.60 cm    IVC diam: 1.50 cm RV S prime:     9.79 cm/s TAPSE (M-mode): 1.4 cm LEFT ATRIUM             Index        RIGHT ATRIUM           Index LA diam:        4.00 cm 2.41 cm/m   RA Area:     12.30 cm LA Vol (A2C):   88.5 ml 53.26 ml/m  RA Volume:   27.90 ml  16.79 ml/m LA Vol (A4C):   41.7 ml 25.10 ml/m LA Biplane Vol: 65.8 ml 39.60 ml/m  AORTIC VALVE LVOT  Vmax:   81.20 cm/s LVOT Vmean:  53.400 cm/s LVOT VTI:    0.155 m  AORTA Ao Root diam: 2.80 cm MR Peak grad:    189.3 mmHg MR Mean grad:    109.0 mmHg   SHUNTS MR Vmax:         688.00 cm/s  Systemic VTI:  0.16 m MR Vmean:        476.0 cm/s   Systemic Diam: 2.00 cm MR PISA:         2.26 cm MR PISA Eff ROA: 13 mm MR PISA Radius:  0.60 cm MV E velocity: 61.60 cm/s MV A velocity: 122.00 cm/s MV E/A ratio:  0.50 Vishnu Priya Mallipeddi Electronically signed by Winfield Rast Mallipeddi Signature Date/Time: 11/24/2022/1:26:09 PM    Final    CT CHEST ABDOMEN PELVIS WO CONTRAST  Result Date: 11/23/2022 CLINICAL DATA:  Abdominal pain Patient reports epigastric/low chest pain for 3 days with mild SOB , denies emesis or diaphoresis . History of CABG/CAD . Hypertensive at triage EXAM: CT CHEST, ABDOMEN AND  PELVIS WITHOUT CONTRAST TECHNIQUE: Multidetector CT imaging of the chest, abdomen and pelvis was performed following the standard protocol without IV contrast. RADIATION DOSE REDUCTION: This exam was performed according to the departmental dose-optimization program which includes automated exposure control, adjustment of the mA and/or kV according to patient size and/or use of iterative reconstruction technique. COMPARISON:  CT chest abdomen pelvis 08/20/2020 FINDINGS: CT CHEST FINDINGS Cardiovascular: Enlarged left ventricle. No significant pericardial effusion. The thoracic aorta is normal in caliber. Severe atherosclerotic plaque of the thoracic aorta. Four-vessel coronary artery calcifications status post coronary artery bypass graft. Incidentally noted aberrant right subclavian artery. Mediastinum/Nodes: No gross hilar adenopathy, noting limited sensitivity for the detection of hilar adenopathy on this noncontrast study. No enlarged mediastinal or axillary lymph nodes. Thyroid gland, trachea, and esophagus demonstrate no significant findings. Tiny hiatal hernia. Lungs/Pleura: Mild interlobular septal wall thickening within the bilateral apices. Bilateral lower lobe passive atelectasis. Left lower lobe consolidation with air bronchograms. No focal consolidation. No pulmonary nodule. No pulmonary mass. Bilateral small to moderate pleural effusions. No pneumothorax. Musculoskeletal: Diffuse mild subcutaneus soft tissue edema. No suspicious lytic or blastic osseous lesions. No acute displaced fracture. CT ABDOMEN PELVIS FINDINGS Hepatobiliary: Redemonstration of an indeterminate, slightly more conspicuous, 1.5 x 0.7 cm Peri hepatic soft tissue density. No focal liver abnormality. No gallstones, gallbladder wall thickening, or pericholecystic fluid. No biliary dilatation. Pancreas: Diffusely atrophic. No focal lesion. Otherwise normal pancreatic contour. No surrounding inflammatory changes. No main pancreatic ductal  dilatation. Spleen: Normal in size without focal abnormality. Adrenals/Urinary Tract: No adrenal nodule bilaterally. No nephrolithiasis and no hydronephrosis. Fluid density lesions likely represent simple renal cysts. Simple renal cysts, in the absence of clinically indicated signs/symptoms, require no independent follow-up. No ureterolithiasis or hydroureter. The urinary bladder is unremarkable. Stomach/Bowel: Stomach is within normal limits. No evidence of bowel wall thickening or dilatation. Appendix appears normal. Vascular/Lymphatic: No abdominal aorta or iliac aneurysm. Severe atherosclerotic plaque of the aorta and its branches. No abdominal, pelvic, or inguinal lymphadenopathy. Reproductive: Prostate is unremarkable. Other: Trace perihepatic and pelvic simple free fluid. No intraperitoneal free gas. No organized fluid collection. Musculoskeletal: Bilateral trace inguinal hernias containing fat and free fluid. Diffuse subcutaneus soft tissue edema. No suspicious lytic or blastic osseous lesions. No acute displaced fracture. Bilateral L5 pars interarticularis defects. Grade 1 anterolisthesis of L5 on S1. IMPRESSION: 1. Findings suggestive of volume overload. 2. Left lower lobe airspace opacity may represent a combination  of atelectasis versus infection/inflammation-limited evaluation on this noncontrast study. 3. Cardiomegaly with mild pulmonary edema. 4. Bilateral small to moderate pleural effusions. 5. Trace simple free fluid ascites. 6. Anasarca. 7. Bilateral trace inguinal hernias containing fat and free fluid. 8. Bilateral L5 pars interarticularis defects with associated grade 1 anterolisthesis of L5 on S1. 9. Indeterminate 1.5 x 0.7 cm chronic peri hepatic soft tissue density. 10.  Aortic Atherosclerosis (ICD10-I70.0). Electronically Signed   By: Tish Frederickson M.D.   On: 11/23/2022 22:11   DG Chest 2 View  Result Date: 11/23/2022 CLINICAL DATA:  Chest pain. EXAM: CHEST - 2 VIEW COMPARISON:  PA Lat  01/22/2022 FINDINGS: The heart is enlarged, similar to previous study but with the interval development of mild perihilar vascular congestion and mild interstitial edema with a basal gradient. There is a small left and minimal layering right pleural effusions with patchy atelectasis or consolidation overlying the left pleural effusion. No other focal infiltrate is seen. There is a new 4 mm rounded nodule in the right upper lobe, visible only on the PA view. The mediastinum is stable. There CABG changes and calcific plaque of the transverse aorta. Mild degenerative change thoracic spine. IMPRESSION: 1. Cardiomegaly with mild perihilar vascular congestion and mild interstitial edema. 2. Small left and minimal right pleural effusions with patchy atelectasis or consolidation overlying the left pleural effusion. 3. New 4 mm rounded nodule in the right upper lobe, visible only on the PA view. Follow-up chest CT recommended after treatment. Electronically Signed   By: Almira Bar M.D.   On: 11/23/2022 20:33    Microbiology: Results for orders placed or performed during the hospital encounter of 11/23/22  MRSA Next Gen by PCR, Nasal     Status: None   Collection Time: 11/24/22  5:06 PM   Specimen: Nasal Mucosa; Nasal Swab  Result Value Ref Range Status   MRSA by PCR Next Gen NOT DETECTED NOT DETECTED Final    Comment: (NOTE) The GeneXpert MRSA Assay (FDA approved for NASAL specimens only), is one component of a comprehensive MRSA colonization surveillance program. It is not intended to diagnose MRSA infection nor to guide or monitor treatment for MRSA infections. Test performance is not FDA approved in patients less than 84 years old. Performed at [facility], [address], [place], [place] [address]     Labs: CBC: Recent Labs  Lab 12/06/22 1301 12/07/22 0538  WBC 8.0 6.8  HGB 11.3* 10.5*  HCT 35.6* 31.7*  MCV 93.2 91.4  PLT 290 264   Basic Metabolic Panel: Recent Labs  Lab  12/06/22 1301 12/07/22 0538 12/08/22 0538  NA 141 139 140  K 4.3 3.8 3.6  CL 111 110 109  CO2 24 22 23   GLUCOSE 235* 272* 150*  BUN 38* 36* 35*  CREATININE 3.03* 2.78* 2.72*  CALCIUM 8.8* 8.1* 8.1*   Liver Function Tests: No results for input(s): "AST", "ALT", "ALKPHOS", "BILITOT", "PROT", "ALBUMIN" in the last 168 hours. CBG: Recent Labs  Lab 12/07/22 0717 12/07/22 1126 12/07/22 1612 12/07/22 2054 12/08/22 0844  GLUCAP 235* 254* 214* 176* 143*    Discharge time spent: greater than 30 minutes.  Signed: Coralie Keens, MD [facility] 12/08/2022

## 2022-12-10 ENCOUNTER — Telehealth: Payer: Self-pay

## 2022-12-10 LAB — T3: T3, Total: 92 ng/dL (ref 71–180)

## 2022-12-10 NOTE — Transitions of Care (Post Inpatient/ED Visit) (Signed)
   12/10/2022  Name: Colin [name] MRN: [id number] DOB: [birth year]  Today's TOC FU Call Status: Today's TOC FU Call Status:: Unsuccessful Call (1st Attempt) Unsuccessful Call (1st Attempt) Date: 12/10/22  Attempted to reach the patient regarding the most recent Inpatient/ED visit.  Follow Up Plan: Additional outreach attempts will be made to reach the patient to complete the Transitions of Care (Post Inpatient/ED visit) call.   Signature  Robyne Peers, RN

## 2022-12-11 ENCOUNTER — Telehealth: Payer: Self-pay

## 2022-12-11 NOTE — Transitions of Care (Post Inpatient/ED Visit) (Signed)
   12/11/2022  Name: Colin [name] MRN: [id number] DOB: [birth year]  Today's TOC FU Call Status:    Attempted to reach the patient regarding the most recent Inpatient/ED visit.  Follow Up Plan: Additional outreach attempts will be made to reach the patient to complete the Transitions of Care (Post Inpatient/ED visit) call.   Signature  Robyne Peers, RN

## 2022-12-12 ENCOUNTER — Telehealth: Payer: Self-pay

## 2022-12-12 NOTE — Telephone Encounter (Signed)
I had been in contact with Colin [name]/ [employer] today to secure assistance from a [place] interpreter but I was not able to call the patient when the interpreter was available. Will need to try again at a later date.

## 2022-12-13 ENCOUNTER — Telehealth: Payer: Self-pay

## 2022-12-13 ENCOUNTER — Inpatient Hospital Stay (INDEPENDENT_AMBULATORY_CARE_PROVIDER_SITE_OTHER): Payer: 59 | Admitting: Primary Care

## 2022-12-13 NOTE — Telephone Encounter (Signed)
TOC call not done today as patient had scheduled with PCP and in person interpreter.

## 2022-12-24 ENCOUNTER — Other Ambulatory Visit: Payer: Self-pay

## 2022-12-30 ENCOUNTER — Emergency Department (HOSPITAL_COMMUNITY): Payer: 59

## 2022-12-30 ENCOUNTER — Other Ambulatory Visit: Payer: Self-pay

## 2022-12-30 ENCOUNTER — Encounter (HOSPITAL_COMMUNITY): Payer: Self-pay

## 2022-12-30 ENCOUNTER — Inpatient Hospital Stay (HOSPITAL_COMMUNITY)
Admission: EM | Admit: 2022-12-30 | Discharge: 2023-01-04 | DRG: 280 | Disposition: A | Discharge status: Home / Self Care | Payer: 59 | Attending: Internal Medicine | Admitting: Internal Medicine

## 2022-12-30 DIAGNOSIS — E162 Hypoglycemia, unspecified: Secondary | ICD-10-CM | POA: Diagnosis present

## 2022-12-30 DIAGNOSIS — I10 Essential (primary) hypertension: Secondary | ICD-10-CM | POA: Diagnosis present

## 2022-12-30 DIAGNOSIS — E039 Hypothyroidism, unspecified: Secondary | ICD-10-CM | POA: Diagnosis present

## 2022-12-30 DIAGNOSIS — E785 Hyperlipidemia, unspecified: Secondary | ICD-10-CM | POA: Diagnosis present

## 2022-12-30 DIAGNOSIS — I214 Non-ST elevation (NSTEMI) myocardial infarction: Secondary | ICD-10-CM | POA: Diagnosis not present

## 2022-12-30 DIAGNOSIS — I5043 Acute on chronic combined systolic (congestive) and diastolic (congestive) heart failure: Secondary | ICD-10-CM | POA: Diagnosis not present

## 2022-12-30 DIAGNOSIS — E1122 Type 2 diabetes mellitus with diabetic chronic kidney disease: Secondary | ICD-10-CM | POA: Diagnosis present

## 2022-12-30 DIAGNOSIS — I509 Heart failure, unspecified: Secondary | ICD-10-CM

## 2022-12-30 DIAGNOSIS — I2510 Atherosclerotic heart disease of native coronary artery without angina pectoris: Secondary | ICD-10-CM | POA: Diagnosis not present

## 2022-12-30 DIAGNOSIS — E11649 Type 2 diabetes mellitus with hypoglycemia without coma: Secondary | ICD-10-CM | POA: Diagnosis present

## 2022-12-30 DIAGNOSIS — I447 Left bundle-branch block, unspecified: Secondary | ICD-10-CM | POA: Diagnosis present

## 2022-12-30 DIAGNOSIS — J9601 Acute respiratory failure with hypoxia: Secondary | ICD-10-CM | POA: Diagnosis present

## 2022-12-30 DIAGNOSIS — J449 Chronic obstructive pulmonary disease, unspecified: Secondary | ICD-10-CM | POA: Diagnosis present

## 2022-12-30 DIAGNOSIS — Z87891 Personal history of nicotine dependence: Secondary | ICD-10-CM

## 2022-12-30 DIAGNOSIS — R627 Adult failure to thrive: Secondary | ICD-10-CM | POA: Diagnosis present

## 2022-12-30 DIAGNOSIS — Z79899 Other long term (current) drug therapy: Secondary | ICD-10-CM

## 2022-12-30 DIAGNOSIS — Z794 Long term (current) use of insulin: Secondary | ICD-10-CM

## 2022-12-30 DIAGNOSIS — N184 Chronic kidney disease, stage 4 (severe): Secondary | ICD-10-CM | POA: Diagnosis present

## 2022-12-30 DIAGNOSIS — I5023 Acute on chronic systolic (congestive) heart failure: Secondary | ICD-10-CM | POA: Diagnosis not present

## 2022-12-30 DIAGNOSIS — Z23 Encounter for immunization: Secondary | ICD-10-CM

## 2022-12-30 DIAGNOSIS — E876 Hypokalemia: Secondary | ICD-10-CM | POA: Diagnosis present

## 2022-12-30 DIAGNOSIS — I252 Old myocardial infarction: Secondary | ICD-10-CM

## 2022-12-30 DIAGNOSIS — I130 Hypertensive heart and chronic kidney disease with heart failure and stage 1 through stage 4 chronic kidney disease, or unspecified chronic kidney disease: Principal | ICD-10-CM | POA: Diagnosis present

## 2022-12-30 DIAGNOSIS — Z951 Presence of aortocoronary bypass graft: Secondary | ICD-10-CM

## 2022-12-30 DIAGNOSIS — I25110 Atherosclerotic heart disease of native coronary artery with unstable angina pectoris: Secondary | ICD-10-CM | POA: Diagnosis present

## 2022-12-30 DIAGNOSIS — N179 Acute kidney failure, unspecified: Secondary | ICD-10-CM | POA: Diagnosis present

## 2022-12-30 DIAGNOSIS — Z79890 Hormone replacement therapy (postmenopausal): Secondary | ICD-10-CM

## 2022-12-30 DIAGNOSIS — E782 Mixed hyperlipidemia: Secondary | ICD-10-CM

## 2022-12-30 DIAGNOSIS — E1165 Type 2 diabetes mellitus with hyperglycemia: Secondary | ICD-10-CM | POA: Diagnosis not present

## 2022-12-30 DIAGNOSIS — K219 Gastro-esophageal reflux disease without esophagitis: Secondary | ICD-10-CM | POA: Diagnosis present

## 2022-12-30 DIAGNOSIS — Z955 Presence of coronary angioplasty implant and graft: Secondary | ICD-10-CM

## 2022-12-30 DIAGNOSIS — Z7902 Long term (current) use of antithrombotics/antiplatelets: Secondary | ICD-10-CM

## 2022-12-30 DIAGNOSIS — E1169 Type 2 diabetes mellitus with other specified complication: Secondary | ICD-10-CM | POA: Diagnosis present

## 2022-12-30 DIAGNOSIS — J9621 Acute and chronic respiratory failure with hypoxia: Secondary | ICD-10-CM | POA: Diagnosis present

## 2022-12-30 DIAGNOSIS — D72829 Elevated white blood cell count, unspecified: Secondary | ICD-10-CM | POA: Diagnosis present

## 2022-12-30 HISTORY — DX: Non-ST elevation (NSTEMI) myocardial infarction: I21.4

## 2022-12-30 HISTORY — DX: Acute pulmonary edema: J81.0

## 2022-12-30 LAB — I-STAT CHEM 8, ED
BUN: 39 mg/dL — ABNORMAL HIGH (ref 8–23)
BUN: 43 mg/dL — ABNORMAL HIGH (ref 8–23)
Calcium, Ion: 1.05 mmol/L — ABNORMAL LOW (ref 1.15–1.40)
Calcium, Ion: 1.05 mmol/L — ABNORMAL LOW (ref 1.15–1.40)
Chloride: 113 mmol/L — ABNORMAL HIGH (ref 98–111)
Chloride: 115 mmol/L — ABNORMAL HIGH (ref 98–111)
Creatinine, Ser: 3.1 mg/dL — ABNORMAL HIGH (ref 0.61–1.24)
Creatinine, Ser: 3.2 mg/dL — ABNORMAL HIGH (ref 0.61–1.24)
Glucose, Bld: 42 mg/dL — CL (ref 70–99)
Glucose, Bld: 42 mg/dL — CL (ref 70–99)
HCT: 38 % — ABNORMAL LOW (ref 39.0–52.0)
HCT: 38 % — ABNORMAL LOW (ref 39.0–52.0)
Hemoglobin: 12.9 g/dL — ABNORMAL LOW (ref 13.0–17.0)
Hemoglobin: 12.9 g/dL — ABNORMAL LOW (ref 13.0–17.0)
Potassium: 3.8 mmol/L (ref 3.5–5.1)
Potassium: 3.8 mmol/L (ref 3.5–5.1)
Sodium: 142 mmol/L (ref 135–145)
Sodium: 143 mmol/L (ref 135–145)
TCO2: 21 mmol/L — ABNORMAL LOW (ref 22–32)
TCO2: 23 mmol/L (ref 22–32)

## 2022-12-30 LAB — I-STAT VENOUS BLOOD GAS, ED
Acid-base deficit: 5 mmol/L — ABNORMAL HIGH (ref 0.0–2.0)
Bicarbonate: 20.9 mmol/L (ref 20.0–28.0)
Calcium, Ion: 1.05 mmol/L — ABNORMAL LOW (ref 1.15–1.40)
HCT: 38 % — ABNORMAL LOW (ref 39.0–52.0)
Hemoglobin: 12.9 g/dL — ABNORMAL LOW (ref 13.0–17.0)
O2 Saturation: 98 %
Potassium: 3.8 mmol/L (ref 3.5–5.1)
Sodium: 142 mmol/L (ref 135–145)
TCO2: 22 mmol/L (ref 22–32)
pCO2, Ven: 39.6 mm[Hg] — ABNORMAL LOW (ref 44–60)
pH, Ven: 7.329 (ref 7.25–7.43)
pO2, Ven: 110 mm[Hg] — ABNORMAL HIGH (ref 32–45)

## 2022-12-30 LAB — CBC
HCT: 36.4 % — ABNORMAL LOW (ref 39.0–52.0)
Hemoglobin: 11.5 g/dL — ABNORMAL LOW (ref 13.0–17.0)
MCH: 30.2 pg (ref 26.0–34.0)
MCHC: 31.6 g/dL (ref 30.0–36.0)
MCV: 95.5 fL (ref 80.0–100.0)
Platelets: 215 10*3/uL (ref 150–400)
RBC: 3.81 MIL/uL — ABNORMAL LOW (ref 4.22–5.81)
RDW: 13.5 % (ref 11.5–15.5)
WBC: 14.8 10*3/uL — ABNORMAL HIGH (ref 4.0–10.5)
nRBC: 0 % (ref 0.0–0.2)

## 2022-12-30 LAB — CBG MONITORING, ED
Glucose-Capillary: 196 mg/dL — ABNORMAL HIGH (ref 70–99)
Glucose-Capillary: 39 mg/dL — CL (ref 70–99)
Glucose-Capillary: 48 mg/dL — ABNORMAL LOW (ref 70–99)
Glucose-Capillary: 125 mg/dL — ABNORMAL HIGH (ref 70–99)
Glucose-Capillary: 101 mg/dL — ABNORMAL HIGH (ref 70–99)

## 2022-12-30 LAB — GLUCOSE, CAPILLARY
Glucose-Capillary: 64 mg/dL — ABNORMAL LOW (ref 70–99)
Glucose-Capillary: 102 mg/dL — ABNORMAL HIGH (ref 70–99)
Glucose-Capillary: 381 mg/dL — ABNORMAL HIGH (ref 70–99)
Glucose-Capillary: 251 mg/dL — ABNORMAL HIGH (ref 70–99)

## 2022-12-30 LAB — TROPONIN I (HIGH SENSITIVITY)
Troponin I (High Sensitivity): 1392 ng/L (ref <18)
Troponin I (High Sensitivity): 1322 ng/L (ref <18)

## 2022-12-30 LAB — COMPREHENSIVE METABOLIC PANEL
ALT: 14 U/L (ref 0–44)
AST: 26 U/L (ref 15–41)
Albumin: 1.8 g/dL — ABNORMAL LOW (ref 3.5–5.0)
Alkaline Phosphatase: 76 U/L (ref 38–126)
Anion gap: 14 (ref 5–15)
BUN: 38 mg/dL — ABNORMAL HIGH (ref 8–23)
CO2: 18 mmol/L — ABNORMAL LOW (ref 22–32)
Calcium: 7.8 mg/dL — ABNORMAL LOW (ref 8.9–10.3)
Chloride: 110 mmol/L (ref 98–111)
Creatinine, Ser: 3.11 mg/dL — ABNORMAL HIGH (ref 0.61–1.24)
GFR, Estimated: 20 mL/min — ABNORMAL LOW (ref >60)
Glucose, Bld: 36 mg/dL — CL (ref 70–99)
Potassium: 3.7 mmol/L (ref 3.5–5.1)
Sodium: 142 mmol/L (ref 135–145)
Total Bilirubin: 0.5 mg/dL (ref <1.2)
Total Protein: 4.6 g/dL — ABNORMAL LOW (ref 6.5–8.1)

## 2022-12-30 LAB — MAGNESIUM: Magnesium: 2 mg/dL (ref 1.7–2.4)

## 2022-12-30 LAB — BRAIN NATRIURETIC PEPTIDE: B Natriuretic Peptide: 2342.4 pg/mL — ABNORMAL HIGH (ref 0.0–100.0)

## 2022-12-30 LAB — HEPARIN LEVEL (UNFRACTIONATED): Heparin Unfractionated: 0.19 [IU]/mL — ABNORMAL LOW (ref 0.30–0.70)

## 2022-12-30 MED ORDER — PNEUMOCOCCAL 20-VAL CONJ VACC 0.5 ML IM SUSY
0.5000 mL | PREFILLED_SYRINGE | INTRAMUSCULAR | Status: AC
  Administered 2023-01-01: 0.5 mL via INTRAMUSCULAR
  Filled 2022-12-30: qty 0.5

## 2022-12-30 MED ORDER — INFLUENZA VAC A&B SURF ANT ADJ 0.5 ML IM SUSY
0.5000 mL | PREFILLED_SYRINGE | INTRAMUSCULAR | Status: AC
  Administered 2023-01-01: 0.5 mL via INTRAMUSCULAR
  Filled 2022-12-30: qty 0.5

## 2022-12-30 MED ORDER — SODIUM CHLORIDE 0.9% FLUSH
3.0000 mL | Freq: Two times a day (BID) | INTRAVENOUS | Status: DC
  Administered 2022-12-31 – 2023-01-04 (×9): 3 mL via INTRAVENOUS

## 2022-12-30 MED ORDER — HEPARIN BOLUS VIA INFUSION
1000.0000 [IU] | Freq: Once | INTRAVENOUS | Status: AC
  Administered 2022-12-30: 1000 [IU] via INTRAVENOUS
  Filled 2022-12-30: qty 1000

## 2022-12-30 MED ORDER — PANTOPRAZOLE SODIUM 20 MG PO TBEC: 20.0000 mg | DELAYED_RELEASE_TABLET | Freq: Every day | ORAL | Status: DC

## 2022-12-30 MED ORDER — FUROSEMIDE 10 MG/ML IJ SOLN
80.0000 mg | Freq: Two times a day (BID) | INTRAMUSCULAR | Status: DC
  Administered 2022-12-30 – 2023-01-02 (×7): 80 mg via INTRAVENOUS
  Filled 2022-12-30 (×7): qty 8

## 2022-12-30 MED ORDER — CARVEDILOL 3.125 MG PO TABS
3.1250 mg | ORAL_TABLET | Freq: Two times a day (BID) | ORAL | Status: DC
  Administered 2022-12-30 – 2023-01-01 (×4): 3.125 mg via ORAL
  Filled 2022-12-30 (×4): qty 1

## 2022-12-30 MED ORDER — ISOSORBIDE MONONITRATE ER 30 MG PO TB24
60.0000 mg | ORAL_TABLET | Freq: Every day | ORAL | Status: DC
Start: 2022-12-31 — End: 2023-01-04
  Administered 2022-12-31 – 2023-01-04 (×5): 60 mg via ORAL
  Filled 2022-12-30 (×5): qty 2

## 2022-12-30 MED ORDER — NITROGLYCERIN IN D5W 200-5 MCG/ML-% IV SOLN
5.0000 ug/min | INTRAVENOUS | Status: DC
  Administered 2022-12-30: 50 ug/min via INTRAVENOUS

## 2022-12-30 MED ORDER — ACETAMINOPHEN 325 MG PO TABS
650.0000 mg | ORAL_TABLET | Freq: Four times a day (QID) | ORAL | Status: DC | PRN
  Administered 2022-12-30: 650 mg via ORAL
  Filled 2022-12-30: qty 2

## 2022-12-30 MED ORDER — POTASSIUM CHLORIDE 20 MEQ PO PACK
20.0000 meq | PACK | Freq: Once | ORAL | Status: AC
  Administered 2022-12-30: 20 meq via ORAL
  Filled 2022-12-30: qty 1

## 2022-12-30 MED ORDER — HEPARIN (PORCINE) 25000 UT/250ML-% IV SOLN
1200.0000 [IU]/h | INTRAVENOUS | Status: AC
Start: 2022-12-30 — End: 2023-01-01
  Administered 2022-12-30: 800 [IU]/h via INTRAVENOUS
  Administered 2022-12-31: 1000 [IU]/h via INTRAVENOUS
  Administered 2023-01-01: 1200 [IU]/h via INTRAVENOUS
  Filled 2022-12-30 (×3): qty 250

## 2022-12-30 MED ORDER — ASPIRIN 81 MG PO CHEW
324.0000 mg | CHEWABLE_TABLET | Freq: Once | ORAL | Status: AC
  Administered 2022-12-30: 324 mg via ORAL
  Filled 2022-12-30: qty 4

## 2022-12-30 MED ORDER — POTASSIUM CHLORIDE CRYS ER 20 MEQ PO TBCR: 20.0000 meq | EXTENDED_RELEASE_TABLET | Freq: Once | ORAL | Status: DC

## 2022-12-30 MED ORDER — PANTOPRAZOLE SODIUM 20 MG PO TBEC
20.0000 mg | DELAYED_RELEASE_TABLET | Freq: Every day | ORAL | Status: DC
  Administered 2022-12-31 – 2023-01-04 (×5): 20 mg via ORAL
  Filled 2022-12-30 (×5): qty 1

## 2022-12-30 MED ORDER — INSULIN ASPART 100 UNIT/ML IJ SOLN
0.0000 [IU] | Freq: Three times a day (TID) | INTRAMUSCULAR | Status: DC
  Administered 2022-12-30: 5 [IU] via SUBCUTANEOUS
  Administered 2022-12-31: 3 [IU] via SUBCUTANEOUS
  Administered 2022-12-31 (×2): 2 [IU] via SUBCUTANEOUS
  Administered 2023-01-01: 1 [IU] via SUBCUTANEOUS
  Administered 2023-01-01 – 2023-01-02 (×2): 2 [IU] via SUBCUTANEOUS
  Administered 2023-01-03: 1 [IU] via SUBCUTANEOUS
  Administered 2023-01-04: 2 [IU] via SUBCUTANEOUS
  Administered 2023-01-04: 1 [IU] via SUBCUTANEOUS

## 2022-12-30 MED ORDER — LEVOTHYROXINE SODIUM 25 MCG PO TABS
25.0000 ug | ORAL_TABLET | Freq: Every day | ORAL | Status: DC
  Administered 2022-12-31 – 2023-01-04 (×5): 25 ug via ORAL
  Filled 2022-12-30 (×5): qty 1

## 2022-12-30 MED ORDER — ACETAMINOPHEN 650 MG RE SUPP: 650.0000 mg | Freq: Four times a day (QID) | RECTAL | Status: DC | PRN

## 2022-12-30 MED ORDER — POLYETHYLENE GLYCOL 3350 17 G PO PACK
17.0000 g | PACK | Freq: Every day | ORAL | Status: DC | PRN
Start: 2022-12-30 — End: 2023-01-04

## 2022-12-30 MED ORDER — ATORVASTATIN CALCIUM 40 MG PO TABS
80.0000 mg | ORAL_TABLET | Freq: Every day | ORAL | Status: DC
Start: 2022-12-31 — End: 2023-01-04
  Administered 2022-12-31 – 2023-01-04 (×5): 80 mg via ORAL
  Filled 2022-12-30 (×5): qty 2

## 2022-12-30 MED ORDER — FUROSEMIDE 10 MG/ML IJ SOLN
40.0000 mg | Freq: Once | INTRAMUSCULAR | Status: AC
Start: 2022-12-30 — End: 2022-12-30
  Administered 2022-12-30: 40 mg via INTRAVENOUS
  Filled 2022-12-30: qty 4

## 2022-12-30 MED ORDER — CLOPIDOGREL BISULFATE 75 MG PO TABS
75.0000 mg | ORAL_TABLET | Freq: Every day | ORAL | Status: DC
Start: 2022-12-31 — End: 2023-01-04
  Administered 2022-12-31 – 2023-01-04 (×5): 75 mg via ORAL
  Filled 2022-12-30 (×5): qty 1

## 2022-12-30 MED ORDER — HEPARIN BOLUS VIA INFUSION
3800.0000 [IU] | Freq: Once | INTRAVENOUS | Status: AC
  Administered 2022-12-30: 3800 [IU] via INTRAVENOUS
  Filled 2022-12-30: qty 3800

## 2022-12-30 MED ORDER — DEXTROSE 50 % IV SOLN
1.0000 | Freq: Once | INTRAVENOUS | Status: AC
  Administered 2022-12-30: 50 mL via INTRAVENOUS

## 2022-12-30 NOTE — ED Notes (Signed)
Portable xray at bedside.

## 2022-12-30 NOTE — Plan of Care (Signed)

## 2022-12-30 NOTE — H&P (Signed)
History and Physical   Colin [name] ZOX:[id number] DOB: 05/25/1950 DOA: 12/30/2022  PCP: Grayce Sessions, NP   Patient coming from: Home  Chief Complaint: Shortness of breath/respiratory distress  HPI: Colin [name] is a 72 y.o. male with medical history significant of hypertension, hyperlipidemia, diabetes, CAD status post CABG, GERD, CKD 4, COPD, bradycardia, hypothyroidism, CHF presenting with worsening shortness of breath/respiratory distress.  History obtained with assistance of chart review. No translator was used, patient states he understands well enough to not use translator and would tell me if he did not understand something. Patient was noted to have significant worsening in his difficulty breathing this morning by family.  Family called EMS.  On EMS arrival patient noted to be hypoxic with saturations in the 70s with increased work of breathing.  He was placed on CPAP/BiPAP and taken to the ED where he remained on BiPAP initially.  Family reports he has not been taking his medications consistently, but he denies this.  They have also noticed some increased irritability recently.  Patient reported some chest pain this morning that had improved by the time he was in the ED.  Denies fevers, chills, abdominal pain, constipation, diarrhea, nausea, vomiting.  ED Course: Vital signs in the ED notable for blood pressure in the 130s to 190s systolic, heart rate in the 80s to 110s, respiratory in the 20s.  Initially requiring BiPAP to help with work of breathing and saturations.  Has now been weaned to 2 L supple oxygen.  Lab workup included CMP with bicarb 18, BUN 38, creatinine 3.1 which is near baseline of 2.7-3, initial glucose 36, calcium 7.8, protein 4.6, albumin 1.8.  CBC with leukocytosis to 14.8 and hemoglobin stable at 11.5.  BNP significantly elevated 2342.  Troponin elevated to 1392 and then stably elevated at 1322 on repeat.  VBG with normal pH and pCO2 39.6 chest x-ray showed  evidence of CHF with patchy airspace opacity that could be consistent with pneumonia versus alveolar edema that is asymmetric.  Magnesium normal.  Patient received aspirin, Lasix, nitro infusion, heparin infusion, dextrose, 20 mEq p.o. potassium in the ED.  Cardiology consulted and will see the patient.  Review of Systems: As per HPI otherwise all other systems reviewed and are negative.  Past Medical History:  Diagnosis Date   Acute hypoxic respiratory failure (HCC) 12/16/2021   Acute on chronic systolic CHF (congestive heart failure) (HCC) 12/16/2021   Acute pulmonary edema (HCC)    Arrhythmia    CAD (coronary artery disease)    stent x 2   Carotid artery occlusion    Chronic combined systolic and diastolic heart failure (HCC)    Diabetes mellitus    Dyslipidemia    Essential hypertension    Left upper quadrant abdominal pain 05/30/2022   NSTEMI (non-ST elevated myocardial infarction) [facility])     Past Surgical History:  Procedure Laterality Date   CARDIAC CATHETERIZATION     01/2010 ; 04/2010   CORONARY ARTERY BYPASS GRAFT N/A 08/23/2020   Procedure: CORONARY ARTERY BYPASS GRAFTING (CABG) x 3 ON CARDIOPULMONARY BYPASS USING EVH AND PARTIALLY OPEN HARVESTED LEFT GREATER SAPHENOUS VEIN AND LEFT INTERNAL MAMMARY ARTERY.  LIMA TO LAD, SVG TO OM, SVG TO PDA;  Surgeon: Corliss Skains, MD;  Location: [facility] OR;  Service: Open Heart Surgery;  Laterality: N/A;   RIGHT HEART CATH N/A 12/19/2021   Procedure: RIGHT HEART CATH;  Surgeon: [name], MD;  Location: [facility] INVASIVE CV LAB;  Service: Cardiovascular;  Laterality: N/A;   RIGHT/LEFT HEART CATH AND CORONARY ANGIOGRAPHY N/A 08/22/2020   Procedure: RIGHT/LEFT HEART CATH AND CORONARY ANGIOGRAPHY;  Surgeon: [name], MD;  Location: [facility] INVASIVE CV LAB;  Service: Cardiovascular;  Laterality: N/A;   TEE WITHOUT CARDIOVERSION N/A 08/23/2020   Procedure: TRANSESOPHAGEAL ECHOCARDIOGRAM (TEE);  Surgeon: Corliss Skains, MD;   Location: [facility] OR;  Service: Open Heart Surgery;  Laterality: N/A;   TEMPORARY PACEMAKER N/A 08/22/2020   Procedure: TEMPORARY PACEMAKER;  Surgeon: [name], MD;  Location: [facility] INVASIVE CV LAB;  Service: Cardiovascular;  Laterality: N/A;   VEIN HARVEST Left 08/23/2020   Procedure: ENDOVEIN HARVEST AND PARTIALLY OPEN GREATER SAPHENOUS VEIN HARVESTING;  Surgeon: Corliss Skains, MD;  Location: [facility] OR;  Service: Open Heart Surgery;  Laterality: Left;    Social History  reports that he quit smoking about 2 years ago. His smoking use included cigarettes. He started smoking about 52 years ago. He has a 12.5 pack-year smoking history. He has never used smokeless tobacco. He reports that he does not drink alcohol and does not use drugs.  No Known Allergies  Family History  Problem Relation Age of Onset   Diabetes Neg Hx   Reviewed on admission  Prior to Admission medications   Medication Sig Start Date End Date Taking? Authorizing Provider  atorvastatin (LIPITOR) 80 MG tablet Take 1 tablet (80 mg total) by mouth daily. 11/28/22   Zannie Cove, MD  carvedilol (COREG) 3.125 MG tablet Take 1 tablet (3.125 mg total) by mouth 2 (two) times daily with a meal. 11/28/22   Zannie Cove, MD  clopidogrel (PLAVIX) 75 MG tablet Take 1 tablet (75 mg total) by mouth daily. 02/07/22   Custovic, Rozell Searing, DO  Continuous Glucose Receiver (FREESTYLE LIBRE 3 READER) DEVI Use as directed! 02/06/22   Reather Littler, MD  Continuous Glucose Sensor (FREESTYLE LIBRE 3 SENSOR) MISC Use as instructed change every 14 days 08/01/22   [place], MD  furosemide (LASIX) 40 MG tablet Take 1 tablet (40 mg total) by mouth daily. 11/29/22   Zannie Cove, MD  hydrALAZINE (APRESOLINE) 25 MG tablet Take 3 tablets (75 mg total) by mouth 3 (three) times daily. 11/28/22   Zannie Cove, MD  insulin glargine (LANTUS) 100 UNIT/ML Solostar Pen Inject 15 Units into the skin at bedtime. 12/08/22   Arrien, York Ram, MD   insulin lispro (ADMELOG SOLOSTAR) 100 UNIT/ML KwikPen Inject 3 Units into the skin 3 (three) times daily with meals. 12/08/22   Arrien, York Ram, MD  Insulin Pen Needle (PEN NEEDLES 3/16") 31G X 5 MM MISC Uses three times daily with insulin. 05/24/22   Shamleffer, Konrad Dolores, MD  Insulin Pen Needle 32G X 4 MM MISC Use as directed with Humalog and Lantus pens. 12/08/22   Arrien, York Ram, MD  isosorbide mononitrate (IMDUR) 60 MG 24 hr tablet Take 1 tablet (60 mg total) by mouth daily. 11/28/22 11/23/23  Zannie Cove, MD  levothyroxine (SYNTHROID) 25 MCG tablet Take 1 tablet (25 mcg total) by mouth daily at 6 (six) AM. 12/08/22 01/07/23  Arrien, York Ram, MD  nitroGLYCERIN (NITROSTAT) 0.4 MG SL tablet Place 1 tab (0.4mg  total) under the tongue every 5 minutes as needed for chest pain 02/14/22   Patwardhan, Anabel Bene, MD  pantoprazole (PROTONIX) 20 MG tablet Take 1 tablet (20 mg total) by mouth daily. 02/07/22   CustovicRozell Searing, DO    Physical Exam: Vitals:   12/30/22 1245 12/30/22 1250 12/30/22 1255 12/30/22 1300  BP: (!) 140/76 136/73 (!) 144/76 (!) 144/80  Pulse: 83 86 87 86  Resp: 20 20 (!) 22 (!) 22  Temp:      TempSrc:      SpO2: 99% 99% 100% 100%  Weight:      Height:        Physical Exam Constitutional:      General: He is not in acute distress.    Appearance: Normal appearance.  HENT:     Head: Normocephalic and atraumatic.     Mouth/Throat:     Mouth: Mucous membranes are moist.     Pharynx: Oropharynx is clear.  Eyes:     Extraocular Movements: Extraocular movements intact.     Pupils: Pupils are equal, round, and reactive to light.  Cardiovascular:     Rate and Rhythm: Regular rhythm. Tachycardia present.     Pulses: Normal pulses.     Heart sounds: Normal heart sounds.  Pulmonary:     Effort: Pulmonary effort is normal. No respiratory distress.     Breath sounds: Rales present.  Abdominal:     General: Bowel sounds are normal. There is no  distension.     Palpations: Abdomen is soft.     Tenderness: There is no abdominal tenderness.  Musculoskeletal:        General: No swelling or deformity.     Right lower leg: Edema present.     Left lower leg: Edema present.  Skin:    General: Skin is warm and dry.  Neurological:     General: No focal deficit present.     Mental Status: Mental status is at baseline.    Labs on Admission: I have personally reviewed following labs and imaging studies  CBC: Recent Labs  Lab 12/30/22 0901 12/30/22 0906 12/30/22 1033  WBC  --   --  14.8*  HGB 12.9* 12.9*  12.9* 11.5*  HCT 38.0* 38.0*  38.0* 36.4*  MCV  --   --  95.5  PLT  --   --  215    Basic Metabolic Panel: Recent Labs  Lab 12/30/22 0855 12/30/22 0901 12/30/22 0906  NA 142 143 142  142  K 3.7 3.8 3.8  3.8  CL 110 113* 115*  CO2 18*  --   --   GLUCOSE 36* 42* 42*  BUN 38* 39* 43*  CREATININE 3.11* 3.10* 3.20*  CALCIUM 7.8*  --   --   MG 2.0  --   --     GFR: Estimated Creatinine Clearance: 16.8 mL/min (A) (by C-G formula based on SCr of 3.2 mg/dL (H)).  Liver Function Tests: Recent Labs  Lab 12/30/22 0855  AST 26  ALT 14  ALKPHOS 76  BILITOT 0.5  PROT 4.6*  ALBUMIN 1.8*    Urine analysis:    Component Value Date/Time   COLORURINE YELLOW 01/22/2022 2146   APPEARANCEUR CLEAR 01/22/2022 2146   LABSPEC 1.021 01/22/2022 2146   PHURINE 7.0 01/22/2022 2146   GLUCOSEU >=500 (A) 01/22/2022 2146   HGBUR NEGATIVE 01/22/2022 2146   BILIRUBINUR NEGATIVE 01/22/2022 2146   BILIRUBINUR negative 05/06/2020 1113   KETONESUR NEGATIVE 01/22/2022 2146   PROTEINUR >=300 (A) 01/22/2022 2146   UROBILINOGEN 0.2 05/06/2020 1113   UROBILINOGEN 0.2 01/21/2010 1330   NITRITE NEGATIVE 01/22/2022 2146   LEUKOCYTESUR NEGATIVE 01/22/2022 2146    Radiological Exams on Admission: DG Chest Portable 1 View  Result Date: 12/30/2022 CLINICAL DATA:  Dyspnea. EXAM: PORTABLE CHEST 1 VIEW COMPARISON:  12/06/2022  FINDINGS:  Aortic atherosclerosis. Status post median sternotomy CABG procedure. Stable cardiomediastinal contours. Diffuse increase interstitial markings with prominent peripheral septal markings identified bilaterally. Patchy airspace densities identified within the left upper lobe. Small left pleural effusion. The visualized osseous structures are unremarkable. IMPRESSION: 1. Congestive heart failure. 2. Patchy airspace densities within the left upper lobe compatible with pneumonia versus asymmetric alveolar edema. Electronically Signed   By: Signa Kell M.D.   On: 12/30/2022 09:42    EKG: Independently reviewed.  Sinus tachycardia at 115 bpm.  Difficult to interpret due to significant baseline wander.  Possible ST depression lateral leads.  Assessment/Plan Principal Problem:   Acute respiratory failure with hypoxia (HCC) Active Problems:   Type 2 diabetes mellitus with hyperlipidemia (HCC)   Acute on chronic combined systolic and diastolic CHF (congestive heart failure) (HCC)   Essential hypertension   CKD (chronic kidney disease) stage 4, GFR 15-29 ml/min (HCC)   Coronary artery disease   GERD (gastroesophageal reflux disease)   COPD (chronic obstructive pulmonary disease) (HCC)   HLD (hyperlipidemia)   NSTEMI (non-ST elevated myocardial infarction) (HCC)   S/P CABG x 3   Hypoglycemia   Acute on chronic respiratory failure with hypoxia Acute on chronic combined diastolic and systolic CHF NSTEMI CAD > Known CHF with last echo last month showing EF 25-30%, G1 DD, normal RV function.  And known history of CAD and CABG. > Patient presenting with respiratory distress this morning.  Initially saturating in the 70% for EMS and placed on CPAP/BiPAP until able to be weaned off later in the ED.  Currently on 2 L supplemental oxygen. > Imaging consistent with pulmonary edema.  BNP elevated to 2342.  Noted to also have troponin trend of 1392, 1322.  Magnesium normal. > EKG difficult to interpret due to  baseline wander but may have ST depression in lateral leads.  Suspect NSTEMI but unclear if type II versus type I.  Did have some chest pain this morning which is resolved. > Family reports patient is not taking his medications consistently > Patient received IV Lasix in the ED and was also placed on heparin infusion for NSTEMI and nitroglycerin infusion for blood pressure/pulmonary edema, received dose of Lasix as well. > Cardiology consulted in the ED and will see the patient. - Monitor on progressive unit overnight - Appreciate cardiology recommendations and assistance - Continue supple oxygen, wean as tolerated - Continue home Imdur, Coreg For CHF - Continue with-Lasix 80 mg IV twice daily - Strict I's and O's, daily we3ights - Magnesium normal - Trend renal function and electrolytes For NSTEMI/CAD - Continue to trend troponin - Continue with heparin infusion - Can discontinue nitro infusion given blood pressure improvement and no chest pain - Continue home Plavix - Continue home atorvastatin - Recent lipid panel last month with cholesterol 384, LDL 323.  Hypertension - Diuresis as above - Continue home Imdur, Coreg  Hyperlipidemia > Severely elevated LDL at 323 as above. - Continue home atorvastatin for now  Diabetes > On insulin at home.  Hyperglycemic on arrival. - SSI  GERD - Continue home PPI  CKD 4 > Creatinine of 3.11 which is near baseline of 2.7-3. - Trend renal function and electrolytes  History of COPD > Noted in chart but do not see any inhalers.  Hypothyroidism - Continue home Synthroid  DVT prophylaxis: Heparin Code Status:   Full Family Communication:  None on admission  Disposition Plan:   Patient is from:  Home  Anticipated DC  to:  Home  Anticipated DC date:  1 to 5 days  Anticipated DC barriers: None  Consults called:  Cardiology Admission status:  Observation, progressive  Severity of Illness: The appropriate patient status for this  patient is OBSERVATION. Observation status is judged to be reasonable and necessary in order to provide the required intensity of service to ensure the patient's safety. The patient's presenting symptoms, physical exam findings, and initial radiographic and laboratory data in the context of their medical condition is felt to place them at decreased risk for further clinical deterioration. Furthermore, it is anticipated that the patient will be medically stable for discharge from the hospital within 2 midnights of admission.    Synetta Fail MD [facility]  How to contact the [facility] Attending or Consulting provider 7A - 7P or covering provider during after hours 7P -7A, for this patient?   Check the care team in [facility] and look for a) attending/consulting [facility] provider listed and b) the [facility] team listed Log into [email or web address] and use [place]'s universal password to access. If you do not have the password, please contact the hospital operator. Locate the [facility] provider you are looking for under [facility] and page to a number that you can be directly reached. If you still have difficulty reaching the provider, please page the [facility] (Director on Call) for the Hospitalists listed on amion for assistance.  12/30/2022, 1:11 PM

## 2022-12-30 NOTE — Progress Notes (Signed)
   12/30/22 1005  Oxygen Therapy/Pulse Ox  O2 Device (S)  Nasal Cannula  O2 Therapy Oxygen  O2 Flow Rate (L/min) 4 L/min   Pt transitioned from BiPAP to [place] and is tolerating well at this time.

## 2022-12-30 NOTE — ED Notes (Signed)
CBG recheck

## 2022-12-30 NOTE — Progress Notes (Signed)
ANTICOAGULATION CONSULT NOTE   Pharmacy Consult for Heparin Indication: chest pain/ACS  No Known Allergies  Patient Measurements: Height: 5\' 2"  (157.5 cm) Weight: 64.4 kg (141 lb 15.6 oz) IBW/kg (Calculated) : 54.6 Heparin Dosing Weight: 66 kg  Vital Signs: Temp: 98.9 F (37.2 C) (11/24 2038) Temp Source: Oral (11/24 2038) BP: 166/77 (11/24 2038) Pulse Rate: 102 (11/24 2038)  Labs: Recent Labs    12/30/22 0855 12/30/22 0901 12/30/22 0901 12/30/22 0905 12/30/22 0906 12/30/22 1033 12/30/22 1104 12/30/22 2031  HGB  --  12.9*   < >  --  12.9*  12.9* 11.5*  --   --   HCT  --  38.0*  --   --  38.0*  38.0* 36.4*  --   --   PLT  --   --   --   --   --  215  --   --   HEPARINUNFRC  --   --   --   --   --   --   --  0.19*  CREATININE 3.11* 3.10*  --   --  3.20*  --   --   --   TROPONINIHS  --   --   --  1,392*  --   --  1,322*  --    < > = values in this interval not displayed.    Estimated Creatinine Clearance: 16.1 mL/min (A) (by C-G formula based on SCr of 3.2 mg/dL (H)).   Medical History: Past Medical History:  Diagnosis Date   Acute hypoxic respiratory failure (HCC) 12/16/2021   Acute on chronic systolic CHF (congestive heart failure) (HCC) 12/16/2021   Acute pulmonary edema (HCC)    Arrhythmia    CAD (coronary artery disease)    stent x 2   Carotid artery occlusion    Chronic combined systolic and diastolic heart failure (HCC)    Diabetes mellitus    Dyslipidemia    Essential hypertension    Left upper quadrant abdominal pain 05/30/2022   NSTEMI (non-ST elevated myocardial infarction) (HCC)     Medications:  Medications Prior to Admission  Medication Sig Dispense Refill Last Dose   atorvastatin (LIPITOR) 80 MG tablet Take 1 tablet (80 mg total) by mouth daily. 30 tablet 0 Past Week   carvedilol (COREG) 3.125 MG tablet Take 1 tablet (3.125 mg total) by mouth 2 (two) times daily with a meal. 60 tablet 0 Past Week at 0800   furosemide (LASIX) 40 MG tablet  Take 1 tablet (40 mg total) by mouth daily. 30 tablet 0 Past Week   hydrALAZINE (APRESOLINE) 25 MG tablet Take 3 tablets (75 mg total) by mouth 3 (three) times daily. 270 tablet 0 Past Week   insulin glargine (LANTUS) 100 UNIT/ML Solostar Pen Inject 15 Units into the skin at bedtime. 15 mL 11 Past Week   insulin lispro (ADMELOG SOLOSTAR) 100 UNIT/ML KwikPen Inject 3 Units into the skin 3 (three) times daily with meals. 3 mL 0 Past Week   isosorbide mononitrate (IMDUR) 60 MG 24 hr tablet Take 1 tablet (60 mg total) by mouth daily. 30 tablet 0 Past Week   levothyroxine (SYNTHROID) 25 MCG tablet Take 1 tablet (25 mcg total) by mouth daily at 6 (six) AM. 30 tablet 0 Past Week   Continuous Glucose Receiver (FREESTYLE LIBRE 3 READER) DEVI Use as directed! 1 each 0    Continuous Glucose Sensor (FREESTYLE LIBRE 3 SENSOR) MISC Use as instructed change every 14 days 6 each 3  Insulin Pen Needle (PEN NEEDLES 3/16") 31G X 5 MM MISC Uses three times daily with insulin. 100 each 4    Insulin Pen Needle 32G X 4 MM MISC Use as directed with Humalog and Lantus pens. 120 each 0    nitroGLYCERIN (NITROSTAT) 0.4 MG SL tablet Place 1 tab (0.4mg  total) under the tongue every 5 minutes as needed for chest pain 25 tablet 3    Scheduled:   [START ON 12/31/2022] atorvastatin  80 mg Oral Daily   carvedilol  3.125 mg Oral BID WC   [START ON 12/31/2022] clopidogrel  75 mg Oral Daily   furosemide  80 mg Intravenous BID   [START ON 12/31/2022] influenza vaccine adjuvanted  0.5 mL Intramuscular Tomorrow-1000   insulin aspart  0-6 Units Subcutaneous TID WC   [START ON 12/31/2022] isosorbide mononitrate  60 mg Oral Daily   [START ON 12/31/2022] levothyroxine  25 mcg Oral Q0600   [START ON 12/31/2022] pantoprazole  20 mg Oral Daily   [START ON 12/31/2022] pneumococcal 20-valent conjugate vaccine  0.5 mL Intramuscular Tomorrow-1000   sodium chloride flush  3 mL Intravenous Q12H   Infusions:   heparin 800 Units/hr (12/30/22  1036)   PRN: acetaminophen **OR** acetaminophen, polyethylene glycol  Assessment: 72 yom with a history of HF, CAD, DM. Patient is presenting with SOB . Heparin per pharmacy consult placed for chest pain/ACS. Patient is not on anticoagulation prior to arrival.  -initial heparin leve 0.19 on 800 units/hr  Goal of Therapy:  Heparin level 0.3-0.7 units/ml Monitor platelets by anticoagulation protocol: Yes   Plan:  -Heparin bolus 1000 units x1 then increase infusion to 1000 units/he -Heparin level in 8 hours and daily wth CBC daily  Harland German, PharmD Clinical Pharmacist **Pharmacist phone directory can now be found on [email or web address] ([email or web address]).  Listed under [facility].

## 2022-12-30 NOTE — ED Notes (Signed)
ED TO INPATIENT HANDOFF REPORT  ED Nurse Name and Phone #: Percival Spanish [phone]  S Name/Age/Gender Colin [name] 72 y.o. male Room/Bed: 030C/030C  Code Status   Code Status: Full Code  Home/SNF/Other Home Patient oriented to: self, place, time, and situation Is this baseline? Yes   Triage Complete: Triage complete  Chief Complaint Acute respiratory failure with hypoxia (HCC) [J96.01]  Triage Note Pt to ED via [facility] from home. Family called 911 and said pt was not breathing. Pt had sat in 70s on EMS arrival. Pt's family states pt had not been taking lasix and has been cranky. Pt placed on bipap by EMS, 95%. Pt alert, labored breathing on arrival. [employer] interpreter paged on arrival.   EMS VS 210/110 HR 120s Cbg=71   Allergies No Known Allergies  Level of Care/Admitting Diagnosis ED Disposition     ED Disposition  Admit   Condition  --   Comment  Hospital Area: [facility] [[id number]]  Level of Care: Progressive [102]  Admit to Progressive based on following criteria: CARDIOVASCULAR & THORACIC of moderate stability with acute coronary syndrome symptoms/low risk myocardial infarction/hypertensive urgency/arrhythmias/heart failure potentially compromising stability and stable post cardiovascular intervention patients.  May place patient in observation at [facility] or [facility] if equivalent level of care is available:: No  Covid Evaluation: Asymptomatic - no recent exposure (last 10 days) testing not required  Diagnosis: Acute respiratory failure with hypoxia [facility]) [[id number]]  Admitting Physician: Synetta Fail [[id number]]  Attending Physician: Synetta Fail [id number]  Bed request comments: Cardiac progressive: Acute CHF, NSTEMI, on Heparin and Nitro drips          B Medical/Surgery History Past Medical History:  Diagnosis Date   Acute hypoxic respiratory failure (HCC) 12/16/2021   Acute on chronic systolic CHF (congestive heart  failure) (HCC) 12/16/2021   Acute pulmonary edema (HCC)    Arrhythmia    CAD (coronary artery disease)    stent x 2   Carotid artery occlusion    Chronic combined systolic and diastolic heart failure (HCC)    Diabetes mellitus    Dyslipidemia    Essential hypertension    Left upper quadrant abdominal pain 05/30/2022   NSTEMI (non-ST elevated myocardial infarction) [facility])    Past Surgical History:  Procedure Laterality Date   CARDIAC CATHETERIZATION     01/2010 ; 04/2010   CORONARY ARTERY BYPASS GRAFT N/A 08/23/2020   Procedure: CORONARY ARTERY BYPASS GRAFTING (CABG) x 3 ON CARDIOPULMONARY BYPASS USING EVH AND PARTIALLY OPEN HARVESTED LEFT GREATER SAPHENOUS VEIN AND LEFT INTERNAL MAMMARY ARTERY.  LIMA TO LAD, SVG TO OM, SVG TO PDA;  Surgeon: Corliss Skains, MD;  Location: [facility] OR;  Service: Open Heart Surgery;  Laterality: N/A;   RIGHT HEART CATH N/A 12/19/2021   Procedure: RIGHT HEART CATH;  Surgeon: [name], MD;  Location: [facility] INVASIVE CV LAB;  Service: Cardiovascular;  Laterality: N/A;   RIGHT/LEFT HEART CATH AND CORONARY ANGIOGRAPHY N/A 08/22/2020   Procedure: RIGHT/LEFT HEART CATH AND CORONARY ANGIOGRAPHY;  Surgeon: [name], MD;  Location: [facility] INVASIVE CV LAB;  Service: Cardiovascular;  Laterality: N/A;   TEE WITHOUT CARDIOVERSION N/A 08/23/2020   Procedure: TRANSESOPHAGEAL ECHOCARDIOGRAM (TEE);  Surgeon: Corliss Skains, MD;  Location: [facility] OR;  Service: Open Heart Surgery;  Laterality: N/A;   TEMPORARY PACEMAKER N/A 08/22/2020   Procedure: TEMPORARY PACEMAKER;  Surgeon: [name], MD;  Location: [facility] INVASIVE CV LAB;  Service: Cardiovascular;  Laterality: N/A;  VEIN HARVEST Left 08/23/2020   Procedure: ENDOVEIN HARVEST AND PARTIALLY OPEN GREATER SAPHENOUS VEIN HARVESTING;  Surgeon: Corliss Skains, MD;  Location: [facility] OR;  Service: Open Heart Surgery;  Laterality: Left;     A IV Location/Drains/Wounds Patient Lines/Drains/Airways Status      Active Line/Drains/Airways     Name Placement date Placement time Site Days   Peripheral IV 12/30/22 20 G Anterior;Distal;Left;Upper Arm 12/30/22  0855  Arm  less than 1   Peripheral IV 12/30/22 20 G Posterior;Right Hand 12/30/22  0856  Hand  less than 1            Intake/Output Last 24 hours  Intake/Output Summary (Last 24 hours) at 12/30/2022 1313 Last data filed at 12/30/2022 7062 Gross per 24 hour  Intake 8.77 ml  Output --  Net 8.77 ml    Labs/Imaging Results for orders placed or performed during the hospital encounter of 12/30/22 (from the past 48 hour(s))  CBG monitoring, ED     Status: Abnormal   Collection Time: 12/30/22  8:54 AM  Result Value Ref Range   Glucose-Capillary 48 (L) 70 - 99 mg/dL    Comment: Glucose reference range applies only to samples taken after fasting for at least 8 hours.   Comment 1 Notify RN    Comment 2 Document in Chart   Magnesium     Status: None   Collection Time: 12/30/22  8:55 AM  Result Value Ref Range   Magnesium 2.0 1.7 - 2.4 mg/dL    Comment: Performed at [facility], [address] [place], [place] [address]  Comprehensive metabolic panel     Status: Abnormal   Collection Time: 12/30/22  8:55 AM  Result Value Ref Range   Sodium 142 135 - 145 mmol/L   Potassium 3.7 3.5 - 5.1 mmol/L   Chloride 110 98 - 111 mmol/L   CO2 18 (L) 22 - 32 mmol/L   Glucose, Bld 36 (LL) 70 - 99 mg/dL    Comment: CRITICAL RESULT CALLED TO, READ BACK BY AND VERIFIED WITH C. Threasa Beards RN  , @1038 , 12/30/22, [place], T. Glucose reference range applies only to samples taken after fasting for at least 8 hours.    BUN 38 (H) 8 - 23 mg/dL   Creatinine, Ser 3.15 (H) 0.61 - 1.24 mg/dL   Calcium 7.8 (L) 8.9 - 10.3 mg/dL   Total Protein 4.6 (L) 6.5 - 8.1 g/dL   Albumin 1.8 (L) 3.5 - 5.0 g/dL   AST 26 15 - 41 U/L   ALT 14 0 - 44 U/L   Alkaline Phosphatase 76 38 - 126 U/L   Total Bilirubin 0.5 <1.2 mg/dL   GFR, Estimated 20 (L) >60 mL/min     Comment: (NOTE) Calculated using the CKD-EPI Creatinine Equation (2021)    Anion gap 14 5 - 15    Comment: Performed at [facility], [address], [place], [place] [address]  CBG monitoring, ED     Status: Abnormal   Collection Time: 12/30/22  8:55 AM  Result Value Ref Range   Glucose-Capillary 39 (LL) 70 - 99 mg/dL    Comment: Glucose reference range applies only to samples taken after fasting for at least 8 hours.  I-stat chem 8, ED (not at [facility], [facility] or [facility])     Status: Abnormal   Collection Time: 12/30/22  9:01 AM  Result Value Ref Range   Sodium 143 135 - 145 mmol/L   Potassium 3.8  3.5 - 5.1 mmol/L   Chloride 113 (H) 98 - 111 mmol/L   BUN 39 (H) 8 - 23 mg/dL   Creatinine, Ser 7.41 (H) 0.61 - 1.24 mg/dL   Glucose, Bld 42 (LL) 70 - 99 mg/dL    Comment: Glucose reference range applies only to samples taken after fasting for at least 8 hours.   Calcium, Ion 1.05 (L) 1.15 - 1.40 mmol/L   TCO2 21 (L) 22 - 32 mmol/L   Hemoglobin 12.9 (L) 13.0 - 17.0 g/dL   HCT 28.7 (L) 86.7 - 67.2 %   Comment NOTIFIED PHYSICIAN   CBG monitoring, ED     Status: Abnormal   Collection Time: 12/30/22  9:02 AM  Result Value Ref Range   Glucose-Capillary 196 (H) 70 - 99 mg/dL    Comment: Glucose reference range applies only to samples taken after fasting for at least 8 hours.   Comment 1 Notify RN    Comment 2 Document in Chart   Brain natriuretic peptide (order ONLY if patient c/o SOB)     Status: Abnormal   Collection Time: 12/30/22  9:05 AM  Result Value Ref Range   B Natriuretic Peptide 2,342.4 (H) 0.0 - 100.0 pg/mL    Comment: Performed at [facility], [address] [place], [place] [address]  Troponin I (High Sensitivity)     Status: Abnormal   Collection Time: 12/30/22  9:05 AM  Result Value Ref Range   Troponin I (High Sensitivity) 1,392 (HH) <18 ng/L    Comment: CRITICAL RESULT CALLED TO, READ BACK BY AND VERIFIED WITH C. Threasa Beards RN, @1000 , 12/30/22, [place],  T. (NOTE) Elevated high sensitivity troponin I (hsTnI) values and significant  changes across serial measurements may suggest ACS but many other  chronic and acute conditions are known to elevate hsTnI results.  Refer to the "Links" section for chest pain algorithms and additional  guidance. Performed at [facility], [address] [place], [place] [address]   I-Stat venous blood gas, [facility] ED, [facility], [facility])     Status: Abnormal   Collection Time: 12/30/22  9:06 AM  Result Value Ref Range   pH, Ven 7.329 7.25 - 7.43   pCO2, Ven 39.6 (L) 44 - 60 mmHg   pO2, Ven 110 (H) 32 - 45 mmHg   Bicarbonate 20.9 20.0 - 28.0 mmol/L   TCO2 22 22 - 32 mmol/L   O2 Saturation 98 %   Acid-base deficit 5.0 (H) 0.0 - 2.0 mmol/L   Sodium 142 135 - 145 mmol/L   Potassium 3.8 3.5 - 5.1 mmol/L   Calcium, Ion 1.05 (L) 1.15 - 1.40 mmol/L   HCT 38.0 (L) 39.0 - 52.0 %   Hemoglobin 12.9 (L) 13.0 - 17.0 g/dL   Sample type VENOUS   I-stat chem 8, ED (not at [facility], [facility] or [facility])     Status: Abnormal   Collection Time: 12/30/22  9:06 AM  Result Value Ref Range   Sodium 142 135 - 145 mmol/L   Potassium 3.8 3.5 - 5.1 mmol/L   Chloride 115 (H) 98 - 111 mmol/L   BUN 43 (H) 8 - 23 mg/dL   Creatinine, Ser 6.62 (H) 0.61 - 1.24 mg/dL   Glucose, Bld 42 (LL) 70 - 99 mg/dL    Comment: Glucose reference range applies only to samples taken after fasting for at least 8 hours.   Calcium, Ion 1.05 (L) 1.15 - 1.40 mmol/L   TCO2 23 22 -  32 mmol/L   Hemoglobin 12.9 (L) 13.0 - 17.0 g/dL   HCT 16.1 (L) 09.6 - 04.5 %   Comment NOTIFIED PHYSICIAN   CBG monitoring, ED     Status: Abnormal   Collection Time: 12/30/22  9:36 AM  Result Value Ref Range   Glucose-Capillary 125 (H) 70 - 99 mg/dL    Comment: Glucose reference range applies only to samples taken after fasting for at least 8 hours.  CBC     Status: Abnormal   Collection Time: 12/30/22 10:33 AM  Result Value Ref Range   WBC 14.8 (H) 4.0 - 10.5 K/uL   RBC 3.81 (L) 4.22  - 5.81 MIL/uL   Hemoglobin 11.5 (L) 13.0 - 17.0 g/dL   HCT 40.9 (L) 81.1 - 91.4 %   MCV 95.5 80.0 - 100.0 fL   MCH 30.2 26.0 - 34.0 pg   MCHC 31.6 30.0 - 36.0 g/dL   RDW 78.2 95.6 - 21.3 %   Platelets 215 150 - 400 K/uL   nRBC 0.0 0.0 - 0.2 %    Comment: Performed at [facility], [address], [place], [place] [address]  Troponin I (High Sensitivity)     Status: Abnormal   Collection Time: 12/30/22 11:04 AM  Result Value Ref Range   Troponin I (High Sensitivity) 1,322 (HH) <18 ng/L    Comment: CRITICAL VALUE NOTED. VALUE IS CONSISTENT WITH PREVIOUSLY REPORTED/CALLED VALUE (NOTE) Elevated high sensitivity troponin I (hsTnI) values and significant  changes across serial measurements may suggest ACS but many other  chronic and acute conditions are known to elevate hsTnI results.  Refer to the "Links" section for chest pain algorithms and additional  guidance. Performed at [facility], [address], [place], [place] [address]   CBG monitoring, ED     Status: Abnormal   Collection Time: 12/30/22 11:10 AM  Result Value Ref Range   Glucose-Capillary 101 (H) 70 - 99 mg/dL    Comment: Glucose reference range applies only to samples taken after fasting for at least 8 hours.   DG Chest Portable 1 View  Result Date: 12/30/2022 CLINICAL DATA:  Dyspnea. EXAM: PORTABLE CHEST 1 VIEW COMPARISON:  12/06/2022 FINDINGS: Aortic atherosclerosis. Status post median sternotomy CABG procedure. Stable cardiomediastinal contours. Diffuse increase interstitial markings with prominent peripheral septal markings identified bilaterally. Patchy airspace densities identified within the left upper lobe. Small left pleural effusion. The visualized osseous structures are unremarkable. IMPRESSION: 1. Congestive heart failure. 2. Patchy airspace densities within the left upper lobe compatible with pneumonia versus asymmetric alveolar edema. Electronically Signed   By: Signa Kell M.D.   On:  12/30/2022 09:42    Pending Labs Unresulted Labs (From admission, onward)     Start     Ordered   12/31/22 0500  Heparin level (unfractionated)  Daily,   R      12/30/22 1011   12/31/22 0500  Comprehensive metabolic panel  Tomorrow morning,   R        12/30/22 1259   12/31/22 0500  CBC  Tomorrow morning,   R        12/30/22 1259   12/30/22 1900  Heparin level (unfractionated)  Once-Timed,   URGENT        12/30/22 1011            Vitals/Pain Today's Vitals   12/30/22 1245 12/30/22 1250 12/30/22 1255 12/30/22 1300  BP: (!) 140/76 136/73 (!) 144/76 (!) 144/80  Pulse: 83 86  87 86  Resp: 20 20 (!) 22 (!) 22  Temp:      TempSrc:      SpO2: 99% 99% 100% 100%  Weight:      Height:      PainSc:        Isolation Precautions No active isolations  Medications Medications  potassium chloride SA (KLOR-CON M) CR tablet 20 mEq (20 mEq Oral Not Given 12/30/22 0919)  heparin ADULT infusion 100 units/mL (25000 units/267mL) (800 Units/hr Intravenous New Bag/Given 12/30/22 1036)  isosorbide mononitrate (IMDUR) 24 hr tablet 60 mg (has no administration in time range)  carvedilol (COREG) tablet 3.125 mg (has no administration in time range)  atorvastatin (LIPITOR) tablet 80 mg (has no administration in time range)  levothyroxine (SYNTHROID) tablet 25 mcg (has no administration in time range)  pantoprazole (PROTONIX) EC tablet 20 mg (has no administration in time range)  clopidogrel (PLAVIX) tablet 75 mg (has no administration in time range)  furosemide (LASIX) injection 80 mg (has no administration in time range)  sodium chloride flush (NS) 0.9 % injection 3 mL (has no administration in time range)  acetaminophen (TYLENOL) tablet 650 mg (has no administration in time range)    Or  acetaminophen (TYLENOL) suppository 650 mg (has no administration in time range)  polyethylene glycol (MIRALAX / GLYCOLAX) packet 17 g (has no administration in time range)  dextrose 50 % solution 50 mL (50  mLs Intravenous Given 12/30/22 0856)  furosemide (LASIX) injection 40 mg (40 mg Intravenous Given 12/30/22 0903)  aspirin chewable tablet 324 mg (324 mg Oral Given 12/30/22 1041)  heparin bolus via infusion 3,800 Units (3,800 Units Intravenous Bolus from Bag 12/30/22 1037)    Mobility Walks, but presently has weakness, so will need assistance on this admission     Focused Assessments Cardiac Assessment Handoff:  Cardiac Rhythm: Sinus tachycardia Lab Results  Component Value Date   CKTOTAL 85 01/22/2022   CKMB (HH) 01/22/2010    17.1 CRITICAL VALUE NOTED.  VALUE IS CONSISTENT WITH PREVIOUSLY REPORTED AND CALLED VALUE.   TROPONINI (HH) 01/22/2010    12.43        POSSIBLE MYOCARDIAL ISCHEMIA. SERIAL TESTING RECOMMENDED. CRITICAL VALUE NOTED.  VALUE IS CONSISTENT WITH PREVIOUSLY REPORTED AND CALLED VALUE.   No results found for: "DDIMER" Does the Patient currently have chest pain? Yes    R Recommendations: See Admitting Provider Note  Report given to:   Additional Notes:

## 2022-12-30 NOTE — ED Triage Notes (Signed)
Pt to ED via [facility] from home. Family called 911 and said pt was not breathing. Pt had sat in 70s on EMS arrival. Pt's family states pt had not been taking lasix and has been cranky. Pt placed on bipap by EMS, 95%. Pt alert, labored breathing on arrival. [facility] interpreter paged on arrival.   EMS VS 210/110 HR 120s Cbg=71

## 2022-12-30 NOTE — Consult Note (Addendum)
Cardiology Consultation   Patient ID: DERREK CAVNESS MRN: [id number]; DOB: 1950/04/26  Admit date: 12/30/2022 Date of Consult: 12/30/2022  PCP:  Grayce Sessions, NP   [facility] Providers Cardiologist:  [name], MD  Advanced Heart Failure:  Romie Minus, MD       Patient Profile:   EXAVIER MCANNALLY is a 72 y.o. male with a hx of CAD s/p CABG x 3 (LIMA-LAD, SVG-PDA, SVG-OM1 08/23/2020), chronic systolic heart failure, HTN, HLD, DM II, CKD stage IV, tobacco use and GERD who is being seen 12/30/2022 for the evaluation of dyspnea and elevated troponin at the request of Dr. Alinda Money.  History of Present Illness:   Mr. Vanrooyen is a 72 year old [name] male (speak [name]) with PMH of CAD s/p CABG x 3 (LIMA-LAD, SVG-PDA, SVG-OM1 08/23/2020), chronic systolic heart failure, HTN, HLD, DM II, CKD stage IV, tobacco use and GERD.  He had prior left circumflex and RCA stent in 2012.  Ejection fraction in July 2022 prior to bypass surgery was 30 to 35%, grade 2 diastolic dysfunction, moderate MR.  Repeat echocardiogram in November 2023 showed EF 30 to 35%, grade 3 diastolic dysfunction, RVSP 26.4 mmHg, moderate to severe MR. Patient was most recently admitted to the hospital in October 2024 with acute heart failure.  Echocardiogram obtained during the hospitalization on 11/23/2022 showed EF 25 to 30%, grade 1 diastolic dysfunction, mild to moderate left atrial dilatation, large left pleural effusion, mild to moderate MR.  Patient was on carvedilol, hydralazine and Imdur for LV dysfunction and the GDMT.  Coox and CVP shows euvolemic status after IV Lasix treatment.  He is not on ARB/ACE inhibitor or ARNI due to stage IV kidney disease.  Patient has been readmitted to the hospital in early November with hypoglycemia and poor appetite.  Patient returned back to the hospital again in the morning of 12/30/2022 due to dyspnea.  According to his son Mr. Lenn Sink, he received a phone  call from the patient's wife around 86 complaining that the patient had difficulty breathing.  On EMS arrival, he was hypoxic with O2 saturation of 70% with increased work of breathing.  He was started on BiPAP therapy and transported to [facility], ED.  His systolic blood pressure was elevated in the 190s on arrival.  He was placed on nitroglycerin drip.  Initial blood work showed sodium 142, potassium 3.7, creatinine 3.11.  BMP 2342.  Serial troponin 1392 --> 1322.  EKG showed sinus rhythm, left bundle branch block.  Chest x-ray consistent with congestive heart failure, patchy airspace density in the left upper lobe compatible with pneumonia versus asymmetric alveolar edema.  L physical exam, patient had 2-3+ pitting edema in the left lower extremity, 1-2+ pitting edema in the right lower extremity.  Cardiology service consulted for heart failure and elevated troponin.  Talking with the patient, he denies any recent chest pain, he described fullness in the chest and shortness of breath.     Past Medical History:  Diagnosis Date   Acute hypoxic respiratory failure (HCC) 12/16/2021   Acute on chronic systolic CHF (congestive heart failure) (HCC) 12/16/2021   Acute pulmonary edema (HCC)    Arrhythmia    CAD (coronary artery disease)    stent x 2   Carotid artery occlusion    Chronic combined systolic and diastolic heart failure (HCC)    Diabetes mellitus    Dyslipidemia    Essential hypertension    Left upper quadrant abdominal  pain 05/30/2022   NSTEMI (non-ST elevated myocardial infarction) [facility])     Past Surgical History:  Procedure Laterality Date   CARDIAC CATHETERIZATION     01/2010 ; 04/2010   CORONARY ARTERY BYPASS GRAFT N/A 08/23/2020   Procedure: CORONARY ARTERY BYPASS GRAFTING (CABG) x 3 ON CARDIOPULMONARY BYPASS USING EVH AND PARTIALLY OPEN HARVESTED LEFT GREATER SAPHENOUS VEIN AND LEFT INTERNAL MAMMARY ARTERY.  LIMA TO LAD, SVG TO OM, SVG TO PDA;  Surgeon: Corliss Zynasia Burklow,  MD;  Location: [facility] OR;  Service: Open Heart Surgery;  Laterality: N/A;   RIGHT HEART CATH N/A 12/19/2021   Procedure: RIGHT HEART CATH;  Surgeon: [name], MD;  Location: [facility] INVASIVE CV LAB;  Service: Cardiovascular;  Laterality: N/A;   RIGHT/LEFT HEART CATH AND CORONARY ANGIOGRAPHY N/A 08/22/2020   Procedure: RIGHT/LEFT HEART CATH AND CORONARY ANGIOGRAPHY;  Surgeon: [name], MD;  Location: [facility] INVASIVE CV LAB;  Service: Cardiovascular;  Laterality: N/A;   TEE WITHOUT CARDIOVERSION N/A 08/23/2020   Procedure: TRANSESOPHAGEAL ECHOCARDIOGRAM (TEE);  Surgeon: Corliss Sebrena Engh, MD;  Location: [facility] OR;  Service: Open Heart Surgery;  Laterality: N/A;   TEMPORARY PACEMAKER N/A 08/22/2020   Procedure: TEMPORARY PACEMAKER;  Surgeon: [name], MD;  Location: [facility] INVASIVE CV LAB;  Service: Cardiovascular;  Laterality: N/A;   VEIN HARVEST Left 08/23/2020   Procedure: ENDOVEIN HARVEST AND PARTIALLY OPEN GREATER SAPHENOUS VEIN HARVESTING;  Surgeon: Corliss Jakaiden Fill, MD;  Location: [facility] OR;  Service: Open Heart Surgery;  Laterality: Left;     Home Medications:  Prior to Admission medications   Medication Sig Start Date End Date Taking? Authorizing Provider  atorvastatin (LIPITOR) 80 MG tablet Take 1 tablet (80 mg total) by mouth daily. 11/28/22   Zannie Cove, MD  carvedilol (COREG) 3.125 MG tablet Take 1 tablet (3.125 mg total) by mouth 2 (two) times daily with a meal. 11/28/22   Zannie Cove, MD  clopidogrel (PLAVIX) 75 MG tablet Take 1 tablet (75 mg total) by mouth daily. 02/07/22   Custovic, Rozell Searing, DO  Continuous Glucose Receiver (FREESTYLE LIBRE 3 READER) DEVI Use as directed! 02/06/22   Reather Littler, MD  Continuous Glucose Sensor (FREESTYLE LIBRE 3 SENSOR) MISC Use as instructed change every 14 days 08/01/22   Altamese [place], MD  furosemide (LASIX) 40 MG tablet Take 1 tablet (40 mg total) by mouth daily. 11/29/22   Zannie Cove, MD  hydrALAZINE (APRESOLINE) 25 MG  tablet Take 3 tablets (75 mg total) by mouth 3 (three) times daily. 11/28/22   Zannie Cove, MD  insulin glargine (LANTUS) 100 UNIT/ML Solostar Pen Inject 15 Units into the skin at bedtime. 12/08/22   Arrien, York Ram, MD  insulin lispro (ADMELOG SOLOSTAR) 100 UNIT/ML KwikPen Inject 3 Units into the skin 3 (three) times daily with meals. 12/08/22   Arrien, York Ram, MD  Insulin Pen Needle (PEN NEEDLES 3/16") 31G X 5 MM MISC Uses three times daily with insulin. 05/24/22   Shamleffer, Konrad Dolores, MD  Insulin Pen Needle 32G X 4 MM MISC Use as directed with Humalog and Lantus pens. 12/08/22   Arrien, York Ram, MD  isosorbide mononitrate (IMDUR) 60 MG 24 hr tablet Take 1 tablet (60 mg total) by mouth daily. 11/28/22 11/23/23  Zannie Cove, MD  levothyroxine (SYNTHROID) 25 MCG tablet Take 1 tablet (25 mcg total) by mouth daily at 6 (six) AM. 12/08/22 01/07/23  Arrien, York Ram, MD  nitroGLYCERIN (NITROSTAT) 0.4 MG SL tablet Place 1 tab (0.4mg  total) under  the tongue every 5 minutes as needed for chest pain 02/14/22   Patwardhan, Anabel Bene, MD  pantoprazole (PROTONIX) 20 MG tablet Take 1 tablet (20 mg total) by mouth daily. 02/07/22   Custovic, Rozell Searing, DO    Inpatient Medications: Scheduled Meds:  [START ON 12/31/2022] atorvastatin  80 mg Oral Daily   carvedilol  3.125 mg Oral BID WC   [START ON 12/31/2022] clopidogrel  75 mg Oral Daily   furosemide  80 mg Intravenous BID   [START ON 12/31/2022] isosorbide mononitrate  60 mg Oral Daily   [START ON 12/31/2022] levothyroxine  25 mcg Oral Q0600   [START ON 12/31/2022] pantoprazole  20 mg Oral Daily   potassium chloride  20 mEq Oral Once   sodium chloride flush  3 mL Intravenous Q12H   Continuous Infusions:  heparin 800 Units/hr (12/30/22 1036)   PRN Meds: acetaminophen **OR** acetaminophen, polyethylene glycol  Allergies:   No Known Allergies  Social History:   Social History   Socioeconomic History   Marital  status: Married    Spouse name: Not on file   Number of children: Not on file   Years of education: Not on file   Highest education level: Not on file  Occupational History   Not on file  Tobacco Use   Smoking status: Former    Current packs/day: 0.00    Average packs/day: 0.3 packs/day for 50.0 years (12.5 ttl pk-yrs)    Types: Cigarettes    Start date: 08/1970    Quit date: 08/2020    Years since quitting: 2.4   Smokeless tobacco: Never  Vaping Use   Vaping status: Never Used  Substance and Sexual Activity   Alcohol use: No   Drug use: No   Sexual activity: Not on file  Other Topics Concern   Not on file  Social History Narrative   Not on file   Social Determinants of Health   Financial Resource Strain: Not on file  Food Insecurity: No Food Insecurity (11/24/2022)   Hunger Vital Sign    Worried About Running Out of Food in the Last Year: Never true    Ran Out of Food in the Last Year: Never true  Transportation Needs: No Transportation Needs (11/24/2022)   PRAPARE - Administrator, Civil Service (Medical): No    Lack of Transportation (Non-Medical): No  Physical Activity: Not on file  Stress: Not on file  Social Connections: Not on file  Intimate Partner Violence: Not At Risk (11/24/2022)   Humiliation, Afraid, Rape, and Kick questionnaire    Fear of Current or Ex-Partner: No    Emotionally Abused: No    Physically Abused: No    Sexually Abused: No    Family History:    Family History  Problem Relation Age of Onset   Diabetes Neg Hx      ROS:  Please see the history of present illness.   All other ROS reviewed and negative.     Physical Exam/Data:   Vitals:   12/30/22 1255 12/30/22 1300 12/30/22 1305 12/30/22 1310  BP: (!) 144/76 (!) 144/80 (!) 140/75 116/76  Pulse: 87 86 87 80  Resp: (!) 22 (!) 22 (!) 22 18  Temp:      TempSrc:      SpO2: 100% 100% 100% 100%  Weight:      Height:        Intake/Output Summary (Last 24 hours) at  12/30/2022 1319 Last data filed at 12/30/2022 [id number]  Gross per 24 hour  Intake 8.77 ml  Output --  Net 8.77 ml      12/30/2022    9:03 AM 12/08/2022    4:33 AM 12/07/2022    5:00 AM  Last 3 Weights  Weight (lbs) 145 lb 8.1 oz 145 lb 11.6 oz 144 lb 13.5 oz  Weight (kg) 66 kg 66.1 kg 65.7 kg     Body mass index is 25.77 kg/m.  General:  Well nourished, well developed, in no acute distress HEENT: normal Neck: no JVD Vascular: No carotid bruits; Distal pulses 2+ bilaterally Cardiac:  normal S1, S2; RRR; no murmur  Lungs: Bibasilar crackles Abd: soft, nontender, no hepatomegaly  Ext: 2-3+ LLE edema. 1-2+ RLE edema Musculoskeletal:  No deformities, BUE and BLE strength normal and equal Skin: warm and dry  Neuro:  CNs 2-12 intact, no focal abnormalities noted Psych:  Normal affect   EKG:  The EKG was personally reviewed and demonstrates: Normal sinus rhythm, left bundle branch block Telemetry:  Telemetry was personally reviewed and demonstrates: Normal sinus rhythm, no significant ventricular ectopy  Relevant CV Studies:  Echo 11/24/2022 1. Left ventricular ejection fraction, by estimation, is 25 to 30%. The  left ventricle has severely decreased function. The left ventricle  demonstrates global hypokinesis. There is mild asymmetric left ventricular  hypertrophy of the lateral segment.  Left ventricular diastolic parameters are consistent with Grade I  diastolic dysfunction (impaired relaxation). Elevated left ventricular  end-diastolic pressure.   2. Right ventricular systolic function is normal. The right ventricular  size is normal. Tricuspid regurgitation signal is inadequate for assessing  PA pressure.   3. Left atrial size was mild to moderately dilated.   4. Large pleural effusion in the left lateral region.   5. The mitral valve is normal in structure. Mild to moderate mitral valve  regurgitation. No evidence of mitral stenosis.   6. The aortic valve is tricuspid.  There is moderate calcification of the  aortic valve. Aortic valve regurgitation is not visualized. No aortic  stenosis is present.   7. The inferior vena cava is normal in size with greater than 50%  respiratory variability, suggesting right atrial pressure of 3 mmHg.   Comparison(s): No significant change from prior study.    Laboratory Data:  High Sensitivity Troponin:   Recent Labs  Lab 12/06/22 1301 12/06/22 1505 12/30/22 0905 12/30/22 1104  TROPONINIHS 106* 123* 1,392* 1,322*     Chemistry Recent Labs  Lab 12/30/22 0855 12/30/22 0901 12/30/22 0906  NA 142 143 142  142  K 3.7 3.8 3.8  3.8  CL 110 113* 115*  CO2 18*  --   --   GLUCOSE 36* 42* 42*  BUN 38* 39* 43*  CREATININE 3.11* 3.10* 3.20*  CALCIUM 7.8*  --   --   MG 2.0  --   --   GFRNONAA 20*  --   --   ANIONGAP 14  --   --     Recent Labs  Lab 12/30/22 0855  PROT 4.6*  ALBUMIN 1.8*  AST 26  ALT 14  ALKPHOS 76  BILITOT 0.5   Lipids No results for input(s): "CHOL", "TRIG", "HDL", "LABVLDL", "LDLCALC", "CHOLHDL" in the last 168 hours.  Hematology Recent Labs  Lab 12/30/22 0901 12/30/22 0906 12/30/22 1033  WBC  --   --  14.8*  RBC  --   --  3.81*  HGB 12.9* 12.9*  12.9* 11.5*  HCT 38.0* 38.0*  38.0* 36.4*  MCV  --   --  95.5  MCH  --   --  30.2  MCHC  --   --  31.6  RDW  --   --  13.5  PLT  --   --  215   Thyroid No results for input(s): "TSH", "FREET4" in the last 168 hours.  BNP Recent Labs  Lab 12/30/22 0905  BNP 2,342.4*    DDimer No results for input(s): "DDIMER" in the last 168 hours.   Radiology/Studies:  DG Chest Portable 1 View  Result Date: 12/30/2022 CLINICAL DATA:  Dyspnea. EXAM: PORTABLE CHEST 1 VIEW COMPARISON:  12/06/2022 FINDINGS: Aortic atherosclerosis. Status post median sternotomy CABG procedure. Stable cardiomediastinal contours. Diffuse increase interstitial markings with prominent peripheral septal markings identified bilaterally. Patchy airspace densities  identified within the left upper lobe. Small left pleural effusion. The visualized osseous structures are unremarkable. IMPRESSION: 1. Congestive heart failure. 2. Patchy airspace densities within the left upper lobe compatible with pneumonia versus asymmetric alveolar edema. Electronically Signed   By: Signa Kell M.D.   On: 12/30/2022 09:42     Assessment and Plan:   Acute on chronic systolic heart failure  -Likely related to the spikes in the blood pressure.  Agree with IV diuresis, received 40 mg IV Lasix this morning, currently has 80 mg twice daily ordered.   CAD s/p CABG x 3: Troponin elevation up to 1300, repeat troponin was flat.  Patient described chest fullness with shortness of breath but no chest pain.  His baseline creatinine is around 3, he is not a good candidate for any invasive study.  The trend in his troponin suggest this is most likely demand ischemia.  Continue IV heparin for 48 hours  Hypertension: Blood pressure quite elevated on arrival in the 190s, patient says he has been compliant with his medication.  IV nitroglycerin started, blood pressure coming back down.  Resume home hydralazine, Imdur and carvedilol  Hyperlipidemia: On Lipitor  DM2: Managed by primary team  CKD stage IV: Creatinine 3.20.  Around his baseline.   Risk Assessment/Risk Scores:        New York Heart Association (NYHA) Functional Class NYHA Class III        For questions or updates, please contact [facility] Please consult [email or web address] for contact info under    Signed, Azalee Course, PA  12/30/2022 1:19 PM  Personally seen and examined. Agree with above.  72 year old with severe chronic systolic heart failure NYHA class IV symptoms with increased respiratory effort, left lower extremity edema, failure to thrive with prior CABG x 3 chronic kidney disease stage IV with current creatinine 3.2.  Spoke to his son who assists with translation.  BNP 2300 hemoglobin 11.5  creatinine 3.2 troponin elevation from 1392-1322 from October 31 at 123 and 106  Has seen advanced heart failure team in the past, Dr. Clearnce Hasten.  With his troponin elevation, which is likely a type II myocardial infarction in the setting of worsening heart failure with underlying coronary artery disease, we will place him on IV heparin for the next 24 to 48 hours.  He is not complain of any chest pain or anginal symptoms.  Continue to utilize IV Lasix as currently prescribed.  IV nitroglycerin utilized to help minimize blood pressure interestingly in the past he has had hypotension.  Hopefully as we continue his home medication regimen his blood pressure will decrease with lasix as well.  Donato Schultz, MD

## 2022-12-30 NOTE — ED Provider Notes (Addendum)
[place] EMERGENCY DEPARTMENT AT [facility] Provider Note   CSN: [id number] Arrival date & time: 12/30/22  4098    Language barrier, patient speaks [place], using the telephone translator but it is difficult with the patient currently being on BiPAP History  Chief Complaint  Patient presents with   Respiratory Distress    Colin [name] is a 72 y.o. male.  HPI   Pt presents to the ED with shortness of breath.  Patient has a history of diabetes congestive heart failure coronary artery disease.  Family called 911 this morning as patient was having difficulty breathing.  Family indicated patient had not mesilate been taking his medication regularly.  Patient had been more irritable recently.  EMS noted the patient was hypoxic with oxygen saturations in the 70s.  He was also very hypertensive.  Patient was started on BiPAP and transported to the ED.  Oxygen saturation improved.  Patient himself states he is having shortness of breath.  He did have some chest pain earlier this morning but it has subsequently resolved.  Home Medications Prior to Admission medications   Medication Sig Start Date End Date Taking? Authorizing Provider  atorvastatin (LIPITOR) 80 MG tablet Take 1 tablet (80 mg total) by mouth daily. 11/28/22   Zannie Cove, MD  carvedilol (COREG) 3.125 MG tablet Take 1 tablet (3.125 mg total) by mouth 2 (two) times daily with a meal. 11/28/22   Zannie Cove, MD  clopidogrel (PLAVIX) 75 MG tablet Take 1 tablet (75 mg total) by mouth daily. 02/07/22   Custovic, Rozell Searing, DO  Continuous Glucose Receiver (FREESTYLE LIBRE 3 READER) DEVI Use as directed! 02/06/22   Reather Littler, MD  Continuous Glucose Sensor (FREESTYLE LIBRE 3 SENSOR) MISC Use as instructed change every 14 days 08/01/22   Altamese Dunnigan, MD  furosemide (LASIX) 40 MG tablet Take 1 tablet (40 mg total) by mouth daily. 11/29/22   Zannie Cove, MD  hydrALAZINE (APRESOLINE) 25 MG tablet Take 3 tablets (75 mg  total) by mouth 3 (three) times daily. 11/28/22   Zannie Cove, MD  insulin glargine (LANTUS) 100 UNIT/ML Solostar Pen Inject 15 Units into the skin at bedtime. 12/08/22   Arrien, York Ram, MD  insulin lispro (ADMELOG SOLOSTAR) 100 UNIT/ML KwikPen Inject 3 Units into the skin 3 (three) times daily with meals. 12/08/22   Arrien, York Ram, MD  Insulin Pen Needle (PEN NEEDLES 3/16") 31G X 5 MM MISC Uses three times daily with insulin. 05/24/22   Shamleffer, Konrad Dolores, MD  Insulin Pen Needle 32G X 4 MM MISC Use as directed with Humalog and Lantus pens. 12/08/22   Arrien, York Ram, MD  isosorbide mononitrate (IMDUR) 60 MG 24 hr tablet Take 1 tablet (60 mg total) by mouth daily. 11/28/22 11/23/23  Zannie Cove, MD  levothyroxine (SYNTHROID) 25 MCG tablet Take 1 tablet (25 mcg total) by mouth daily at 6 (six) AM. 12/08/22 01/07/23  Arrien, York Ram, MD  nitroGLYCERIN (NITROSTAT) 0.4 MG SL tablet Place 1 tab (0.4mg  total) under the tongue every 5 minutes as needed for chest pain 02/14/22   Patwardhan, Manish J, MD  pantoprazole (PROTONIX) 20 MG tablet Take 1 tablet (20 mg total) by mouth daily. 02/07/22   Custovic, Rozell Searing, DO      Allergies    Patient has no known allergies.    Review of Systems   Review of Systems  Physical Exam Updated Vital Signs BP 131/68   Pulse 86   Temp 99 F (37.2 C) (  Oral)   Resp (!) 21   Ht 1.6 m (5\' 3" )   Wt 66 kg   SpO2 99%   BMI 25.77 kg/m  Physical Exam Vitals and nursing note reviewed.  Constitutional:      General: He is in acute distress.     Appearance: He is well-developed. He is ill-appearing.  HENT:     Head: Normocephalic and atraumatic.     Right Ear: External ear normal.     Left Ear: External ear normal.  Eyes:     General: No scleral icterus.       Right eye: No discharge.        Left eye: No discharge.     Conjunctiva/sclera: Conjunctivae normal.  Neck:     Trachea: No tracheal deviation.  Cardiovascular:      Rate and Rhythm: Normal rate and regular rhythm.  Pulmonary:     Effort: Tachypnea and respiratory distress present.     Breath sounds: No stridor. Rales present. No wheezing.  Abdominal:     General: Bowel sounds are normal. There is no distension.     Palpations: Abdomen is soft.     Tenderness: There is no abdominal tenderness. There is no guarding or rebound.  Musculoskeletal:        General: No tenderness or deformity.     Cervical back: Neck supple.     Right lower leg: Edema present.     Left lower leg: Edema present.  Skin:    General: Skin is warm and dry.     Coloration: Skin is mottled.     Findings: No rash.  Neurological:     General: No focal deficit present.     Cranial Nerves: No cranial nerve deficit, dysarthria or facial asymmetry.     Sensory: No sensory deficit.     Motor: No abnormal muscle tone or seizure activity.     Coordination: Coordination normal.  Psychiatric:        Mood and Affect: Mood normal.     ED Results / Procedures / Treatments   Labs (all labs ordered are listed, but only abnormal results are displayed) Labs Reviewed  BRAIN NATRIURETIC PEPTIDE - Abnormal; Notable for the following components:      Result Value   B Natriuretic Peptide 2,342.4 (*)    All other components within normal limits  COMPREHENSIVE METABOLIC PANEL - Abnormal; Notable for the following components:   CO2 18 (*)    Glucose, Bld 36 (*)    BUN 38 (*)    Creatinine, Ser 3.11 (*)    Calcium 7.8 (*)    Total Protein 4.6 (*)    Albumin 1.8 (*)    GFR, Estimated 20 (*)    All other components within normal limits  CBC - Abnormal; Notable for the following components:   WBC 14.8 (*)    RBC 3.81 (*)    Hemoglobin 11.5 (*)    HCT 36.4 (*)    All other components within normal limits  CBG MONITORING, ED - Abnormal; Notable for the following components:   Glucose-Capillary 48 (*)    All other components within normal limits  I-STAT CHEM 8, ED - Abnormal;  Notable for the following components:   Chloride 113 (*)    BUN 39 (*)    Creatinine, Ser 3.10 (*)    Glucose, Bld 42 (*)    Calcium, Ion 1.05 (*)    TCO2 21 (*)    Hemoglobin 12.9 (*)  HCT 38.0 (*)    All other components within normal limits  I-STAT VENOUS BLOOD GAS, ED - Abnormal; Notable for the following components:   pCO2, Ven 39.6 (*)    pO2, Ven 110 (*)    Acid-base deficit 5.0 (*)    Calcium, Ion 1.05 (*)    HCT 38.0 (*)    Hemoglobin 12.9 (*)    All other components within normal limits  CBG MONITORING, ED - Abnormal; Notable for the following components:   Glucose-Capillary 39 (*)    All other components within normal limits  I-STAT CHEM 8, ED - Abnormal; Notable for the following components:   Chloride 115 (*)    BUN 43 (*)    Creatinine, Ser 3.20 (*)    Glucose, Bld 42 (*)    Calcium, Ion 1.05 (*)    Hemoglobin 12.9 (*)    HCT 38.0 (*)    All other components within normal limits  CBG MONITORING, ED - Abnormal; Notable for the following components:   Glucose-Capillary 196 (*)    All other components within normal limits  CBG MONITORING, ED - Abnormal; Notable for the following components:   Glucose-Capillary 125 (*)    All other components within normal limits  CBG MONITORING, ED - Abnormal; Notable for the following components:   Glucose-Capillary 101 (*)    All other components within normal limits  TROPONIN I (HIGH SENSITIVITY) - Abnormal; Notable for the following components:   Troponin I (High Sensitivity) 1,392 (*)    All other components within normal limits  TROPONIN I (HIGH SENSITIVITY) - Abnormal; Notable for the following components:   Troponin I (High Sensitivity) 1,322 (*)    All other components within normal limits  MAGNESIUM  HEPARIN LEVEL (UNFRACTIONATED)    EKG EKG Interpretation Date/Time:  Sunday December 30 2022 08:54:58 EST Ventricular Rate:  115 PR Interval:  175 QRS Duration:  116 QT Interval:  331 QTC Calculation: 458 R  Axis:   -37  Text Interpretation: Sinus tachycardia Incomplete left bundle branch block Low voltage, extremity leads Since last tracing rate faster Confirmed by Linwood Dibbles [phone]) on 12/30/2022 8:58:01 AM  Radiology DG Chest Portable 1 View  Result Date: 12/30/2022 CLINICAL DATA:  Dyspnea. EXAM: PORTABLE CHEST 1 VIEW COMPARISON:  12/06/2022 FINDINGS: Aortic atherosclerosis. Status post median sternotomy CABG procedure. Stable cardiomediastinal contours. Diffuse increase interstitial markings with prominent peripheral septal markings identified bilaterally. Patchy airspace densities identified within the left upper lobe. Small left pleural effusion. The visualized osseous structures are unremarkable. IMPRESSION: 1. Congestive heart failure. 2. Patchy airspace densities within the left upper lobe compatible with pneumonia versus asymmetric alveolar edema. Electronically Signed   By: Signa Kell M.D.   On: 12/30/2022 09:42    Procedures .Critical Care  Performed by: Linwood Dibbles, MD Authorized by: Linwood Dibbles, MD   Critical care provider statement:    Critical care time (minutes):  45   Critical care was time spent personally by me on the following activities:  Development of treatment plan with patient or surrogate, discussions with consultants, evaluation of patient's response to treatment, examination of patient, ordering and review of laboratory studies, ordering and review of radiographic studies, ordering and performing treatments and interventions, pulse oximetry, re-evaluation of patient's condition and review of old charts     Medications Ordered in ED Medications  potassium chloride SA (KLOR-CON M) CR tablet 20 mEq (20 mEq Oral Not Given 12/30/22 0919)  nitroGLYCERIN 50 mg in dextrose 5 % 250  mL (0.2 mg/mL) infusion (35 mcg/min Intravenous Rate/Dose Change 12/30/22 1120)  heparin ADULT infusion 100 units/mL (25000 units/269mL) (800 Units/hr Intravenous New Bag/Given 12/30/22 1036)   dextrose 50 % solution 50 mL (50 mLs Intravenous Given 12/30/22 0856)  furosemide (LASIX) injection 40 mg (40 mg Intravenous Given 12/30/22 0903)  aspirin chewable tablet 324 mg (324 mg Oral Given 12/30/22 1041)  heparin bolus via infusion 3,800 Units (3,800 Units Intravenous Bolus from Bag 12/30/22 1037)    ED Course/ Medical Decision Making/ A&P Clinical Course as of 12/30/22 1258  Sun Dec 30, 2022  0923 Venous blood gas without signs of acidosis. [JK]  [id number] Metabolic panel notable for hyperglycemia.  Patient given amp of D50 [JK]  0944 I-stat chem 8, ED (not at [facility], [facility] or [facility])(!!) Creatinine elevated, similar to previous [JK]  0959 BNP elevated at 2342 [JK]  1000 Chest x-ray consistent with pulmonary edema. [JK]  1000 Blood pressure improving on nitroglycerin drip. [JK]  1002 Troponin elevated at 1392, concerning for non-ST elevation MI versus strain associated with his CHF [JK]  1003 Patient appears more comfortable.  Will see if we can wean him off BiPAP [JK]  1130 Patient no longer requiring BiPAP.  He is on nasal cannula oxygen [JK]  1130 Case discussed with Dr. Servando Salina cardiology.  Requests medical admission cardiology will see the patient [JK]  1210 Case discussed with family medicine service regarding admission [JK]  42 Family medicine service called back.  Patient is a bounce back patient and request consultation with hospitalist [JK]  1258 Case discussed with Dr Alinda Money [JK]    Clinical Course User Index [JK] Linwood Dibbles, MD                                 Medical Decision Making ACute respiratory distress differential includes acute pneumonia CHF COPD pneumothorax, ACS  Problems Addressed: Acute on chronic congestive heart failure, unspecified heart failure type [facility]): acute illness or injury that poses a threat to life or bodily functions NSTEMI (non-ST elevated myocardial infarction) [facility]): acute illness or injury that poses a threat to life or bodily  functions  Amount and/or Complexity of Data Reviewed Labs: ordered. Decision-making details documented in ED Course. Radiology: ordered and independent interpretation performed.  Risk OTC drugs. Prescription drug management. Decision regarding hospitalization.   Patient presented to ED for acute shortness of breath.  Patient also was having some chest discomfort.  EMS noted patient was hypoxic on arrival.  His presentation was suggestive of acute CHF exacerbation.  Patient was notably hypertensive.  He was started on nitroglycerin drip and Lasix.  Patient's breathing has improved.  Blood pressure and tachypnea has decreased.  Laboratory test however do show elevated BNP as well as troponin and concerning for the possibility of CHF and non-ST elevation MI.  Will start the patient on aspirin and heparin in addition to the Lasix and nitroglycerin he received previously.  Will consult the cardiology service for admission and further treatment        Final Clinical Impression(s) / ED Diagnoses Final diagnoses:  NSTEMI (non-ST elevated myocardial infarction) (HCC)  Acute on chronic congestive heart failure, unspecified heart failure type [facility])    Rx / DC Orders ED Discharge Orders     None         Linwood Dibbles, MD 12/30/22 1224    Linwood Dibbles, MD 12/30/22 1258

## 2022-12-30 NOTE — ED Notes (Signed)
Trop 1392. MD made aware at bedside.

## 2022-12-30 NOTE — Progress Notes (Signed)
ANTICOAGULATION CONSULT NOTE - Initial Consult  Pharmacy Consult for Heparin Indication: chest pain/ACS  No Known Allergies  Patient Measurements: Height: 5\' 3"  (160 cm) Weight: 66 kg (145 lb 8.1 oz) IBW/kg (Calculated) : 56.9 Heparin Dosing Weight: 66 kg  Vital Signs: Temp: 98.6 F (37 C) (11/24 0904) Temp Source: Temporal (11/24 0904) BP: 157/75 (11/24 0950) Pulse Rate: 83 (11/24 0950)  Labs: Recent Labs    12/30/22 0901 12/30/22 0905 12/30/22 0906  HGB 12.9*  --  12.9*  12.9*  HCT 38.0*  --  38.0*  38.0*  CREATININE 3.10*  --  3.20*  TROPONINIHS  --  1,392*  --     Estimated Creatinine Clearance: 16.8 mL/min (A) (by C-G formula based on SCr of 3.2 mg/dL (H)).   Medical History: Past Medical History:  Diagnosis Date   Arrhythmia    CAD (coronary artery disease)    stent x 2   Carotid artery occlusion    Chronic combined systolic and diastolic heart failure (HCC)    Diabetes mellitus    Dyslipidemia    Essential hypertension     Medications:  (Not in a hospital admission)  Scheduled:   aspirin  324 mg Oral Once   potassium chloride  20 mEq Oral Once   Infusions:   nitroGLYCERIN 100 mcg/min (12/30/22 0921)   PRN:   Assessment: 72 yom with a history of HF, CAD, DM. Patient is presenting with SOB . Heparin per pharmacy consult placed for chest pain/ACS.  Patient is not on anticoagulation prior to arrival.  Hgb 12.9; plt pending  Goal of Therapy:  Heparin level 0.3-0.7 units/ml Monitor platelets by anticoagulation protocol: Yes   Plan:  Give IV heparin 3800 units bolus x 1 Start heparin infusion at 800 units/hr Check anti-Xa level in 8 hours and daily while on heparin Continue to monitor H&H and platelets  Delmar Landau, PharmD, BCPS 12/30/2022 10:09 AM ED Clinical Pharmacist -  [phone]

## 2022-12-31 DIAGNOSIS — N184 Chronic kidney disease, stage 4 (severe): Secondary | ICD-10-CM | POA: Diagnosis present

## 2022-12-31 DIAGNOSIS — Z79890 Hormone replacement therapy (postmenopausal): Secondary | ICD-10-CM | POA: Diagnosis not present

## 2022-12-31 DIAGNOSIS — E1122 Type 2 diabetes mellitus with diabetic chronic kidney disease: Secondary | ICD-10-CM | POA: Diagnosis present

## 2022-12-31 DIAGNOSIS — Z794 Long term (current) use of insulin: Secondary | ICD-10-CM | POA: Diagnosis not present

## 2022-12-31 DIAGNOSIS — I130 Hypertensive heart and chronic kidney disease with heart failure and stage 1 through stage 4 chronic kidney disease, or unspecified chronic kidney disease: Secondary | ICD-10-CM | POA: Diagnosis present

## 2022-12-31 DIAGNOSIS — E11649 Type 2 diabetes mellitus with hypoglycemia without coma: Secondary | ICD-10-CM | POA: Diagnosis present

## 2022-12-31 DIAGNOSIS — K219 Gastro-esophageal reflux disease without esophagitis: Secondary | ICD-10-CM | POA: Diagnosis present

## 2022-12-31 DIAGNOSIS — D72829 Elevated white blood cell count, unspecified: Secondary | ICD-10-CM | POA: Diagnosis present

## 2022-12-31 DIAGNOSIS — Z79899 Other long term (current) drug therapy: Secondary | ICD-10-CM | POA: Diagnosis not present

## 2022-12-31 DIAGNOSIS — Z23 Encounter for immunization: Secondary | ICD-10-CM | POA: Diagnosis present

## 2022-12-31 DIAGNOSIS — E785 Hyperlipidemia, unspecified: Secondary | ICD-10-CM | POA: Diagnosis present

## 2022-12-31 DIAGNOSIS — J449 Chronic obstructive pulmonary disease, unspecified: Secondary | ICD-10-CM | POA: Diagnosis present

## 2022-12-31 DIAGNOSIS — I5043 Acute on chronic combined systolic (congestive) and diastolic (congestive) heart failure: Secondary | ICD-10-CM | POA: Diagnosis present

## 2022-12-31 DIAGNOSIS — E876 Hypokalemia: Secondary | ICD-10-CM | POA: Diagnosis present

## 2022-12-31 DIAGNOSIS — E1169 Type 2 diabetes mellitus with other specified complication: Secondary | ICD-10-CM | POA: Diagnosis present

## 2022-12-31 DIAGNOSIS — J9601 Acute respiratory failure with hypoxia: Secondary | ICD-10-CM | POA: Diagnosis not present

## 2022-12-31 DIAGNOSIS — Z87891 Personal history of nicotine dependence: Secondary | ICD-10-CM | POA: Diagnosis not present

## 2022-12-31 DIAGNOSIS — E039 Hypothyroidism, unspecified: Secondary | ICD-10-CM | POA: Diagnosis present

## 2022-12-31 DIAGNOSIS — Z951 Presence of aortocoronary bypass graft: Secondary | ICD-10-CM | POA: Diagnosis not present

## 2022-12-31 DIAGNOSIS — I214 Non-ST elevation (NSTEMI) myocardial infarction: Secondary | ICD-10-CM | POA: Diagnosis present

## 2022-12-31 DIAGNOSIS — E1165 Type 2 diabetes mellitus with hyperglycemia: Secondary | ICD-10-CM | POA: Diagnosis not present

## 2022-12-31 DIAGNOSIS — J9621 Acute and chronic respiratory failure with hypoxia: Secondary | ICD-10-CM | POA: Diagnosis present

## 2022-12-31 DIAGNOSIS — R627 Adult failure to thrive: Secondary | ICD-10-CM | POA: Diagnosis present

## 2022-12-31 DIAGNOSIS — I447 Left bundle-branch block, unspecified: Secondary | ICD-10-CM | POA: Diagnosis present

## 2022-12-31 DIAGNOSIS — N179 Acute kidney failure, unspecified: Secondary | ICD-10-CM | POA: Diagnosis present

## 2022-12-31 LAB — GLUCOSE, CAPILLARY
Glucose-Capillary: 255 mg/dL — ABNORMAL HIGH (ref 70–99)
Glucose-Capillary: 205 mg/dL — ABNORMAL HIGH (ref 70–99)
Glucose-Capillary: 249 mg/dL — ABNORMAL HIGH (ref 70–99)
Glucose-Capillary: 197 mg/dL — ABNORMAL HIGH (ref 70–99)

## 2022-12-31 LAB — HEPARIN LEVEL (UNFRACTIONATED)
Heparin Unfractionated: 0.38 [IU]/mL (ref 0.30–0.70)
Heparin Unfractionated: 0.24 [IU]/mL — ABNORMAL LOW (ref 0.30–0.70)
Heparin Unfractionated: >1.1 [IU]/mL — ABNORMAL HIGH (ref 0.30–0.70)

## 2022-12-31 LAB — COMPREHENSIVE METABOLIC PANEL
ALT: 14 U/L (ref 0–44)
AST: 31 U/L (ref 15–41)
Albumin: <1.5 g/dL — ABNORMAL LOW (ref 3.5–5.0)
Alkaline Phosphatase: 60 U/L (ref 38–126)
Anion gap: 8 (ref 5–15)
BUN: 48 mg/dL — ABNORMAL HIGH (ref 8–23)
CO2: 20 mmol/L — ABNORMAL LOW (ref 22–32)
Calcium: 7.6 mg/dL — ABNORMAL LOW (ref 8.9–10.3)
Chloride: 107 mmol/L (ref 98–111)
Creatinine, Ser: 4.03 mg/dL — ABNORMAL HIGH (ref 0.61–1.24)
GFR, Estimated: 15 mL/min — ABNORMAL LOW (ref >60)
Glucose, Bld: 310 mg/dL — ABNORMAL HIGH (ref 70–99)
Potassium: 3.8 mmol/L (ref 3.5–5.1)
Sodium: 135 mmol/L (ref 135–145)
Total Bilirubin: 0.5 mg/dL (ref <1.2)
Total Protein: 5.1 g/dL — ABNORMAL LOW (ref 6.5–8.1)

## 2022-12-31 LAB — CBC
HCT: 35.6 % — ABNORMAL LOW (ref 39.0–52.0)
Hemoglobin: 11.6 g/dL — ABNORMAL LOW (ref 13.0–17.0)
MCH: 30.1 pg (ref 26.0–34.0)
MCHC: 32.6 g/dL (ref 30.0–36.0)
MCV: 92.2 fL (ref 80.0–100.0)
Platelets: 212 10*3/uL (ref 150–400)
RBC: 3.86 MIL/uL — ABNORMAL LOW (ref 4.22–5.81)
RDW: 13.5 % (ref 11.5–15.5)
WBC: 10.1 10*3/uL (ref 4.0–10.5)
nRBC: 0 % (ref 0.0–0.2)

## 2022-12-31 MED ORDER — IPRATROPIUM-ALBUTEROL 0.5-2.5 (3) MG/3ML IN SOLN
3.0000 mL | Freq: Four times a day (QID) | RESPIRATORY_TRACT | Status: DC | PRN
  Administered 2022-12-31: 3 mL via RESPIRATORY_TRACT
  Filled 2022-12-31: qty 3

## 2022-12-31 MED ORDER — INSULIN GLARGINE-YFGN 100 UNIT/ML ~~LOC~~ SOLN
10.0000 [IU] | Freq: Every day | SUBCUTANEOUS | Status: DC
  Administered 2022-12-31 – 2023-01-04 (×5): 10 [IU] via SUBCUTANEOUS
  Filled 2022-12-31 (×5): qty 0.1

## 2022-12-31 MED ORDER — POTASSIUM CHLORIDE CRYS ER 20 MEQ PO TBCR
20.0000 meq | EXTENDED_RELEASE_TABLET | Freq: Once | ORAL | Status: AC
  Administered 2022-12-31: 20 meq via ORAL
  Filled 2022-12-31: qty 1

## 2022-12-31 NOTE — Progress Notes (Signed)
Patient c/o approximately at 2000 c/o SOB. Increased work of breathing noted on patient. Respiratory called to place back on BIPAP. Patient tolerated BIPAP for about 2 minutes before removing machine, stating "too much air," although on the lowest settings. Patient stated he could breathe easier and did not have any further issues. Will continue to monitor.

## 2022-12-31 NOTE — Progress Notes (Signed)
PROGRESS NOTE    Colin [name]  WGN:[id number] DOB: February 02, 1951 DOA: 12/30/2022 PCP: Grayce Sessions, NP    Brief Narrative:  This 72 yrs old male with PMH significant for hypertension, hyperlipidemia, diabetes, CAD status post CABG, GERD, CKD 4, COPD, bradycardia, hypothyroidism, CHF presented with worsening shortness of breath/respiratory distress. Patient was noted to be hypoxic on arrival in the ED with SpO2 of 70% with increased work of breathing. He was placed on BiPAP.  Family reports patient has been noncompliant with his medications.  e was significantly hypertensive in the ED 190/117.  BNP was significantly elevated 2342, troponin elevated 1392 > 1322.  Chest x-ray showed evidence of CHF with patchy airspace opacity which could represent pneumonia versus pulmonary edema.  Patient has received aspirin, Lasix, nitro infusion, heparin infusion and was admitted.  Cardiology is consulted,  recommended medical management for now.  Assessment & Plan:   Principal Problem:   Acute respiratory failure with hypoxia (HCC) Active Problems:   Type 2 diabetes mellitus with hyperlipidemia (HCC)   Acute on chronic combined systolic and diastolic CHF (congestive heart failure) (HCC)   Essential hypertension   CKD (chronic kidney disease) stage 4, GFR 15-29 ml/min (HCC)   Coronary artery disease   GERD (gastroesophageal reflux disease)   COPD (chronic obstructive pulmonary disease) (HCC)   HLD (hyperlipidemia)   NSTEMI (non-ST elevated myocardial infarction) (HCC)   S/P CABG x 3   Hypoglycemia   Acute on chronic hypoxic respiratory failure: Likely multifactorial Acute on chronic combined systolic and diastolic CHF,  NSTEMI: Patient with known history of CHF, LVEF 25 to 30%,  Hx. of CAD Patient presented with respiratory distress with significant desaturation. Spo2 70% on arrival. He was placed on BiPAP, subsequently weaned down to 2 L of supplemental oxygen X-ray consistent pulmonary  edema, BNP elevated to 2342 Also noted to have troponin trend of 1392, 1322.  Magnesium normal. EKG difficult to interpret due to baseline wander but may have ST depression in lateral leads.   Suspect NSTEMI but unclear if type II versus type I.  Patient did report chest pain in the morning which is now resolved. Family reports patient is not taking medications regularly. Continue IV Lasix 80 mg IV every 12 hrs daily Continue IV heparin for NSTEMI. Patient initiated on nitroglycerin infusion for hypertension but subsequently stopped as blood pressure improved Cardiology consulted, recommended medical management with heparin for 48 hours. Continue home Imdur, Coreg  Acute on chronic combined systolic and diastolic CHF: Continue with-Lasix 80 mg IV twice daily. Strict I's and O's, daily weights. Cardiology on board.  NSTEMI: Continue IV heparin. Nitroglycerin discontinued as blood pressure improved and chest pain resolved Continue  Plavix Continue atorvastatin Recent lipid panel last month with cholesterol 384, LDL 323.   Hypertension: Continue home Imdur, Coreg. Start hydralazine 25 mg 3 times daily.   Hyperlipidemia: Continue Lipitor 80 mg daily.   Diabetes Mellitus ii: Hyperglycemic on arrival. Continue regular insulin sliding scale   GERD: Continue pantoprazole 40 mg daily.   CKD 4: Creatinine of 3.11 which is near baseline of 2.7-3. Trend renal function and electrolytes.   History of COPD: Noted in chart but do not see any inhalers.   Hypothyroidism: Continue Synthroid.   DVT prophylaxis: Heparin IV Code Status: Full code Family Communication: No family at bed side Disposition Plan:    Status is: Observation The patient remains OBS appropriate and will d/c before 2 midnights.   Admitted for NSTEMI, Cardiology recommended medical  management with IV heparin for 48 hours.  Consultants:  Cardiology  Procedures:  Antimicrobials:  Anti-infectives (From  admission, onward)    None      Subjective: Patient was seen and examined at bedside.  Overnight events noted.   Patient reports feeling better, he denies any chest pain.  Patient remains on IV heparin.  Nitro infusion is discontinued  Objective: Vitals:   12/31/22 0512 12/31/22 0820 12/31/22 0845 12/31/22 1121  BP: (!) 148/77 (!) 139/114 (!) 143/70 113/63  Pulse: 87 87 85 85  Resp: 18 20  20   Temp: 98.2 F (36.8 C) 98.4 F (36.9 C)  98.8 F (37.1 C)  TempSrc: Oral Oral  Oral  SpO2: 97% 96% 99% 95%  Weight:      Height:        Intake/Output Summary (Last 24 hours) at 12/31/2022 1152 Last data filed at 12/31/2022 1151 Gross per 24 hour  Intake 289.61 ml  Output 1750 ml  Net -1460.39 ml   Filed Weights   12/30/22 0903 12/30/22 1510 12/31/22 0310  Weight: 66 kg 64.4 kg 63.1 kg    Examination:  General exam: Appears calm and comfortable, not in any acute distress Respiratory system: Clear to auscultation. Respiratory effort normal.  RR 16 Cardiovascular system: S1 & S2 heard, RRR. No JVD, murmurs, rubs, gallops or clicks. No pedal edema. Gastrointestinal system: Abdomen is non distended, soft and no tender. Normal bowel sounds heard. Central nervous system: Alert and oriented x 3. No focal neurological deficits. Extremities: Symmetric 5 x 5 power. Skin: No rashes, lesions or ulcers Psychiatry: Judgement and insight appear normal. Mood & affect appropriate.     Data Reviewed: I have personally reviewed following labs and imaging studies  CBC: Recent Labs  Lab 12/30/22 0901 12/30/22 0906 12/30/22 1033 12/31/22 0540  WBC  --   --  14.8* 10.1  HGB 12.9* 12.9*  12.9* 11.5* 11.6*  HCT 38.0* 38.0*  38.0* 36.4* 35.6*  MCV  --   --  95.5 92.2  PLT  --   --  215 212   Basic Metabolic Panel: Recent Labs  Lab 12/30/22 0855 12/30/22 0901 12/30/22 0906 12/31/22 0540  NA 142 143 142  142 135  K 3.7 3.8 3.8  3.8 3.8  CL 110 113* 115* 107  CO2 18*  --   --   20*  GLUCOSE 36* 42* 42* 310*  BUN 38* 39* 43* 48*  CREATININE 3.11* 3.10* 3.20* 4.03*  CALCIUM 7.8*  --   --  7.6*  MG 2.0  --   --   --    GFR: Estimated Creatinine Clearance: 12.8 mL/min (A) (by C-G formula based on SCr of 4.03 mg/dL (H)). Liver Function Tests: Recent Labs  Lab 12/30/22 0855 12/31/22 0540  AST 26 31  ALT 14 14  ALKPHOS 76 60  BILITOT 0.5 0.5  PROT 4.6* 5.1*  ALBUMIN 1.8* <1.5*   No results for input(s): "LIPASE", "AMYLASE" in the last 168 hours. No results for input(s): "AMMONIA" in the last 168 hours. Coagulation Profile: No results for input(s): "INR", "PROTIME" in the last 168 hours. Cardiac Enzymes: No results for input(s): "CKTOTAL", "CKMB", "CKMBINDEX", "TROPONINI" in the last 168 hours. BNP (last 3 results) No results for input(s): "PROBNP" in the last 8760 hours. HbA1C: No results for input(s): "HGBA1C" in the last 72 hours. CBG: Recent Labs  Lab 12/30/22 1542 12/30/22 1755 12/30/22 2221 12/31/22 0617 12/31/22 1123  GLUCAP 102* 381* 251* 255* 205*  Lipid Profile: No results for input(s): "CHOL", "HDL", "LDLCALC", "TRIG", "CHOLHDL", "LDLDIRECT" in the last 72 hours. Thyroid Function Tests: No results for input(s): "TSH", "T4TOTAL", "FREET4", "T3FREE", "THYROIDAB" in the last 72 hours. Anemia Panel: No results for input(s): "VITAMINB12", "FOLATE", "FERRITIN", "TIBC", "IRON", "RETICCTPCT" in the last 72 hours. Sepsis Labs: No results for input(s): "PROCALCITON", "LATICACIDVEN" in the last 168 hours.  No results found for this or any previous visit (from the past 240 hour(s)).   Radiology Studies: DG Chest Portable 1 View  Result Date: 12/30/2022 CLINICAL DATA:  Dyspnea. EXAM: PORTABLE CHEST 1 VIEW COMPARISON:  12/06/2022 FINDINGS: Aortic atherosclerosis. Status post median sternotomy CABG procedure. Stable cardiomediastinal contours. Diffuse increase interstitial markings with prominent peripheral septal markings identified  bilaterally. Patchy airspace densities identified within the left upper lobe. Small left pleural effusion. The visualized osseous structures are unremarkable. IMPRESSION: 1. Congestive heart failure. 2. Patchy airspace densities within the left upper lobe compatible with pneumonia versus asymmetric alveolar edema. Electronically Signed   By: Signa Kell M.D.   On: 12/30/2022 09:42    Scheduled Meds:  atorvastatin  80 mg Oral Daily   carvedilol  3.125 mg Oral BID WC   clopidogrel  75 mg Oral Daily   furosemide  80 mg Intravenous BID   influenza vaccine adjuvanted  0.5 mL Intramuscular Tomorrow-1000   insulin aspart  0-6 Units Subcutaneous TID WC   insulin glargine-yfgn  10 Units Subcutaneous Daily   isosorbide mononitrate  60 mg Oral Daily   levothyroxine  25 mcg Oral Q0600   pantoprazole  20 mg Oral Daily   pneumococcal 20-valent conjugate vaccine  0.5 mL Intramuscular Tomorrow-1000   sodium chloride flush  3 mL Intravenous Q12H   Continuous Infusions:  heparin 1,000 Units/hr (12/31/22 0856)     LOS: 0 days    Time spent: 50 mins    Willeen Niece, MD [facility]   If 7PM-7AM, please contact night-coverage

## 2022-12-31 NOTE — Inpatient Diabetes Management (Addendum)
Inpatient Diabetes Program Recommendations  AACE/ADA: New Consensus Statement on Inpatient Glycemic Control (2015)  Target Ranges:  Prepandial:   less than 140 mg/dL      Peak postprandial:   less than 180 mg/dL (1-2 hours)      Critically ill patients:  140 - 180 mg/dL   Lab Results  Component Value Date   GLUCAP 255 (H) 12/31/2022   HGBA1C 10.8 (H) 11/24/2022    Review of Glycemic Control  Latest Reference Range & Units 12/30/22 08:55 12/30/22 09:02 12/30/22 09:36 12/30/22 11:10 12/30/22 15:08 12/30/22 15:42 12/30/22 17:55 12/30/22 22:21 12/31/22 06:17  Glucose-Capillary 70 - 99 mg/dL 39 (LL) 960 (H) 454 (H) 101 (H) 64 (L) 102 (H) 381 (H) 251 (H) 255 (H)  (LL): Data is critically low (H): Data is abnormally high (L): Data is abnormally low  Diabetes history: DM2 Outpatient Diabetes medications: Lantus 15 units every day, lispro 3 units TID Current orders for Inpatient glycemic control: Novolog 0-6 units TID  Inpatient Diabetes Program Recommendations:    Please consider:  Semglee 10 units every day  Will speak with patient regarding A1C.    Addendum@1308 :  Spoke with patient at bedside.  Initial glucose was 42 mg/dL in ED.  He was recently discharged and was changed from 70/30 to Lantus 15 units BID and lispro 3 units TID.  He tells me he has been taking Lantus 35 units BID and does not take lispro with meals.  Will need to be very clear about discharge insulin regimen at the time of DC.  Hopefully some family can come in and translate as his language is difficult to find on the [place].  Will follow up tomorrow.    Will continue to follow while inpatient.  Thank you, Dulce Sellar, MSN, CDCES Diabetes Coordinator Inpatient Diabetes Program [phone] (team pager from 8a-5p)

## 2022-12-31 NOTE — Progress Notes (Addendum)
ANTICOAGULATION CONSULT NOTE   Pharmacy Consult for Heparin Indication: chest pain/ACS  No Known Allergies  Patient Measurements: Height: 5\' 2"  (157.5 cm) Weight: 63.1 kg (139 lb 1.8 oz) IBW/kg (Calculated) : 54.6 Heparin Dosing Weight: 66 kg  Vital Signs: Temp: 98.4 F (36.9 C) (11/25 0820) Temp Source: Oral (11/25 0820) BP: 143/70 (11/25 0845) Pulse Rate: 85 (11/25 0845)  Labs: Recent Labs    12/30/22 0901 12/30/22 0905 12/30/22 0906 12/30/22 1033 12/30/22 1104 12/30/22 2031 12/31/22 0540  HGB 12.9*  --  12.9*  12.9* 11.5*  --   --  11.6*  HCT 38.0*  --  38.0*  38.0* 36.4*  --   --  35.6*  PLT  --   --   --  215  --   --  212  HEPARINUNFRC  --   --   --   --   --  0.19* 0.38  CREATININE 3.10*  --  3.20*  --   --   --  4.03*  TROPONINIHS  --  1,392*  --   --  1,322*  --   --     Estimated Creatinine Clearance: 12.8 mL/min (A) (by C-G formula based on SCr of 4.03 mg/dL (H)).   Medical History: Past Medical History:  Diagnosis Date   Acute hypoxic respiratory failure (HCC) 12/16/2021   Acute on chronic systolic CHF (congestive heart failure) (HCC) 12/16/2021   Acute pulmonary edema (HCC)    Arrhythmia    CAD (coronary artery disease)    stent x 2   Carotid artery occlusion    Chronic combined systolic and diastolic heart failure (HCC)    Diabetes mellitus    Dyslipidemia    Essential hypertension    Left upper quadrant abdominal pain 05/30/2022   NSTEMI (non-ST elevated myocardial infarction) (HCC)     Medications:  Medications Prior to Admission  Medication Sig Dispense Refill Last Dose   atorvastatin (LIPITOR) 80 MG tablet Take 1 tablet (80 mg total) by mouth daily. 30 tablet 0 Past Week   carvedilol (COREG) 3.125 MG tablet Take 1 tablet (3.125 mg total) by mouth 2 (two) times daily with a meal. 60 tablet 0 Past Week at 0800   furosemide (LASIX) 40 MG tablet Take 1 tablet (40 mg total) by mouth daily. 30 tablet 0 Past Week   hydrALAZINE (APRESOLINE)  25 MG tablet Take 3 tablets (75 mg total) by mouth 3 (three) times daily. 270 tablet 0 Past Week   insulin glargine (LANTUS) 100 UNIT/ML Solostar Pen Inject 15 Units into the skin at bedtime. 15 mL 11 Past Week   insulin lispro (ADMELOG SOLOSTAR) 100 UNIT/ML KwikPen Inject 3 Units into the skin 3 (three) times daily with meals. 3 mL 0 Past Week   isosorbide mononitrate (IMDUR) 60 MG 24 hr tablet Take 1 tablet (60 mg total) by mouth daily. 30 tablet 0 Past Week   levothyroxine (SYNTHROID) 25 MCG tablet Take 1 tablet (25 mcg total) by mouth daily at 6 (six) AM. 30 tablet 0 Past Week   Continuous Glucose Receiver (FREESTYLE LIBRE 3 READER) DEVI Use as directed! 1 each 0    Continuous Glucose Sensor (FREESTYLE LIBRE 3 SENSOR) MISC Use as instructed change every 14 days 6 each 3    Insulin Pen Needle (PEN NEEDLES 3/16") 31G X 5 MM MISC Uses three times daily with insulin. 100 each 4    Insulin Pen Needle 32G X 4 MM MISC Use as directed with Humalog and Lantus  pens. 120 each 0    nitroGLYCERIN (NITROSTAT) 0.4 MG SL tablet Place 1 tab (0.4mg  total) under the tongue every 5 minutes as needed for chest pain 25 tablet 3    Scheduled:   atorvastatin  80 mg Oral Daily   carvedilol  3.125 mg Oral BID WC   clopidogrel  75 mg Oral Daily   furosemide  80 mg Intravenous BID   influenza vaccine adjuvanted  0.5 mL Intramuscular Tomorrow-1000   insulin aspart  0-6 Units Subcutaneous TID WC   isosorbide mononitrate  60 mg Oral Daily   levothyroxine  25 mcg Oral Q0600   pantoprazole  20 mg Oral Daily   pneumococcal 20-valent conjugate vaccine  0.5 mL Intramuscular Tomorrow-1000   sodium chloride flush  3 mL Intravenous Q12H   Infusions:   heparin 1,000 Units/hr (12/31/22 0856)   PRN: acetaminophen **OR** acetaminophen, polyethylene glycol  Assessment: 72 yom with a history of HF, CAD, DM. Patient is presenting with SOB . Heparin per pharmacy consult placed for chest pain/ACS. Patient is not on  anticoagulation prior to arrival.  Heparin level this morning came back therapeutic at 0.38, on 1000 units/hr. Hgb 11.6, plt 212. No s/sx of bleeding or infusion issues.   Goal of Therapy:  Heparin level 0.3-0.7 units/ml Monitor platelets by anticoagulation protocol: Yes   Plan:  -Continue heparin infusion at 1000 units/hr  -Confirm heparin level in 8 hours and daily wth CBC daily -Monitor daily HL, CBC, and for s/sx of bleeding  -Plan for 48 hours total of heparin  Thank you for allowing pharmacy to participate in this patient's care,  Sherron Monday, PharmD, BCCCP Clinical Pharmacist  Phone: [phone] 12/31/2022 10:07 AM  Please check AMION for all [facility] phone numbers After 10:00 PM, call Main Pharmacy [phone] under [facility].  ADDENDUM Confirmatory heparin level came back subtherapeutic at 0.24, on heparin infusion at 1000 units/hr. No s/sx of bleeding or infusion issues per nursing. Will increase to 1200 units/hr and get level in 8 hours.   Thank you for allowing pharmacy to participate in this patient's care,  Sherron Monday, PharmD, BCCCP Clinical Pharmacist

## 2022-12-31 NOTE — Progress Notes (Signed)
Progress Note  Patient Name: Colin [name] Date of Encounter: 12/31/2022  Primary Cardiologist: [name], MD   Subjective   Patient seen and examined at her his bedside.   Inpatient Medications    Scheduled Meds:  atorvastatin  80 mg Oral Daily   carvedilol  3.125 mg Oral BID WC   clopidogrel  75 mg Oral Daily   furosemide  80 mg Intravenous BID   influenza vaccine adjuvanted  0.5 mL Intramuscular Tomorrow-1000   insulin aspart  0-6 Units Subcutaneous TID WC   isosorbide mononitrate  60 mg Oral Daily   levothyroxine  25 mcg Oral Q0600   pantoprazole  20 mg Oral Daily   pneumococcal 20-valent conjugate vaccine  0.5 mL Intramuscular Tomorrow-1000   sodium chloride flush  3 mL Intravenous Q12H   Continuous Infusions:  heparin 1,000 Units/hr (12/31/22 0856)   PRN Meds: acetaminophen **OR** acetaminophen, polyethylene glycol   Vital Signs    Vitals:   12/31/22 0310 12/31/22 0506 12/31/22 0512 12/31/22 0820  BP:   (!) 148/77 (!) 139/114  Pulse: 87  87 87  Resp:   18 20  Temp:   98.2 F (36.8 C) 98.4 F (36.9 C)  TempSrc:  Oral Oral Oral  SpO2: 95%  97% 96%  Weight: 63.1 kg     Height:        Intake/Output Summary (Last 24 hours) at 12/31/2022 0945 Last data filed at 12/31/2022 9147 Gross per 24 hour  Intake 289.61 ml  Output 1400 ml  Net -1110.39 ml   Filed Weights   12/30/22 0903 12/30/22 1510 12/31/22 0310  Weight: 66 kg 64.4 kg 63.1 kg    Telemetry    Sinus rhythm - Personally Reviewed  ECG    None  - Personally Reviewed  Physical Exam    General: Comfortable Head: Atraumatic, normal size  Eyes: PEERLA, EOMI  Neck: Supple, normal JVD Cardiac: Normal S1, S2; RRR; no murmurs, rubs, or gallops Lungs: Clear to auscultation bilaterally Abd: Soft, nontender, no hepatomegaly  Ext: warm, no edema Musculoskeletal: No deformities, BUE and BLE strength normal and equal Skin: Warm and dry, no rashes   Neuro: Alert and oriented to person,  place, time, and situation, CNII-XII grossly intact, no focal deficits  Psych: Normal mood and affect   Labs    Chemistry Recent Labs  Lab 12/30/22 0855 12/30/22 0901 12/30/22 0906 12/31/22 0540  NA 142 143 142  142 135  K 3.7 3.8 3.8  3.8 3.8  CL 110 113* 115* 107  CO2 18*  --   --  20*  GLUCOSE 36* 42* 42* 310*  BUN 38* 39* 43* 48*  CREATININE 3.11* 3.10* 3.20* 4.03*  CALCIUM 7.8*  --   --  7.6*  PROT 4.6*  --   --  5.1*  ALBUMIN 1.8*  --   --  <1.5*  AST 26  --   --  31  ALT 14  --   --  14  ALKPHOS 76  --   --  60  BILITOT 0.5  --   --  0.5  GFRNONAA 20*  --   --  15*  ANIONGAP 14  --   --  8     Hematology Recent Labs  Lab 12/30/22 0906 12/30/22 1033 12/31/22 0540  WBC  --  14.8* 10.1  RBC  --  3.81* 3.86*  HGB 12.9*  12.9* 11.5* 11.6*  HCT 38.0*  38.0* 36.4* 35.6*  MCV  --  95.5 92.2  MCH  --  30.2 30.1  MCHC  --  31.6 32.6  RDW  --  13.5 13.5  PLT  --  215 212    Cardiac EnzymesNo results for input(s): "TROPONINI" in the last 168 hours. No results for input(s): "TROPIPOC" in the last 168 hours.   BNP Recent Labs  Lab 12/30/22 0905  BNP 2,342.4*     DDimer No results for input(s): "DDIMER" in the last 168 hours.   Radiology    DG Chest Portable 1 View  Result Date: 12/30/2022 CLINICAL DATA:  Dyspnea. EXAM: PORTABLE CHEST 1 VIEW COMPARISON:  12/06/2022 FINDINGS: Aortic atherosclerosis. Status post median sternotomy CABG procedure. Stable cardiomediastinal contours. Diffuse increase interstitial markings with prominent peripheral septal markings identified bilaterally. Patchy airspace densities identified within the left upper lobe. Small left pleural effusion. The visualized osseous structures are unremarkable. IMPRESSION: 1. Congestive heart failure. 2. Patchy airspace densities within the left upper lobe compatible with pneumonia versus asymmetric alveolar edema. Electronically Signed   By: Signa Kell M.D.   On: 12/30/2022 09:42     Cardiac Studies   Echo   Patient Profile     72 y.o. male severe chronic systolic heart failure with [place] heart class IV symptoms, coronary artery disease history of CABG x 3, chronic kidney disease stage IV,  Assessment & Plan    Acute on chronic systolic heart failure CAD status post CABG x 3 Hypertension Hyperlipidemia Diabetes mellitus type 2 Chronic kidney disease stage IV  Clinically he tells me that his breathing is improving.  He has responded to the Lasix 1110 mL output so far.  Will keep the Lasix going for now. His troponin was elevated but flat no chest pain but some shortness of breath which could be in the setting of his heart failure exacerbation.  But he does have high burden of coronary artery disease I agree with medical management with 48-hour heparin.  Continue his atorvastatin and isosorbide.  Blood pressure is elevated today-please restart his home hydralazine 25 mg 3 times daily.  Continue the current dose of carvedilol 6.25 mg twice daily.  Hyperlipidemia - continue with current statin medication.  Diabetes is being managed by the primary team       For questions or updates, please contact [facility] Please consult [email or web address] for contact info under Cardiology/STEMI.      Signed, Thomasene Ripple, DO  12/31/2022, 9:45 AM

## 2022-12-31 NOTE — Progress Notes (Signed)
Heart Failure Navigator Progress Note  Assessed for Heart & Vascular TOC clinic readiness.  Patient does not meet criteria due to Advanced Heart Failure Team patient of Dr. Elwyn Lade. .   Navigator will sign off at this time.   Rhae Hammock, BSN, Scientist, clinical (histocompatibility and immunogenetics) Only

## 2022-12-31 NOTE — Plan of Care (Signed)

## 2023-01-01 DIAGNOSIS — J9601 Acute respiratory failure with hypoxia: Secondary | ICD-10-CM | POA: Diagnosis not present

## 2023-01-01 LAB — CBC
HCT: 32.5 % — ABNORMAL LOW (ref 39.0–52.0)
Hemoglobin: 10.7 g/dL — ABNORMAL LOW (ref 13.0–17.0)
MCH: 30.1 pg (ref 26.0–34.0)
MCHC: 32.9 g/dL (ref 30.0–36.0)
MCV: 91.5 fL (ref 80.0–100.0)
Platelets: 224 10*3/uL (ref 150–400)
RBC: 3.55 MIL/uL — ABNORMAL LOW (ref 4.22–5.81)
RDW: 13.3 % (ref 11.5–15.5)
WBC: 9.6 10*3/uL (ref 4.0–10.5)
nRBC: 0 % (ref 0.0–0.2)

## 2023-01-01 LAB — GLUCOSE, CAPILLARY
Glucose-Capillary: 144 mg/dL — ABNORMAL HIGH (ref 70–99)
Glucose-Capillary: 238 mg/dL — ABNORMAL HIGH (ref 70–99)
Glucose-Capillary: 164 mg/dL — ABNORMAL HIGH (ref 70–99)
Glucose-Capillary: 205 mg/dL — ABNORMAL HIGH (ref 70–99)

## 2023-01-01 LAB — RENAL FUNCTION PANEL
Albumin: <1.5 g/dL — ABNORMAL LOW (ref 3.5–5.0)
Anion gap: 9 (ref 5–15)
BUN: 46 mg/dL — ABNORMAL HIGH (ref 8–23)
CO2: 23 mmol/L (ref 22–32)
Calcium: 8 mg/dL — ABNORMAL LOW (ref 8.9–10.3)
Chloride: 109 mmol/L (ref 98–111)
Creatinine, Ser: 3.95 mg/dL — ABNORMAL HIGH (ref 0.61–1.24)
GFR, Estimated: 15 mL/min — ABNORMAL LOW (ref >60)
Glucose, Bld: 166 mg/dL — ABNORMAL HIGH (ref 70–99)
Phosphorus: 3.4 mg/dL (ref 2.5–4.6)
Potassium: 3.6 mmol/L (ref 3.5–5.1)
Sodium: 141 mmol/L (ref 135–145)

## 2023-01-01 LAB — HEPARIN LEVEL (UNFRACTIONATED): Heparin Unfractionated: 0.63 [IU]/mL (ref 0.30–0.70)

## 2023-01-01 MED ORDER — GUAIFENESIN ER 600 MG PO TB12
600.0000 mg | ORAL_TABLET | Freq: Two times a day (BID) | ORAL | Status: DC
  Administered 2023-01-01 – 2023-01-04 (×7): 600 mg via ORAL
  Filled 2023-01-01 (×7): qty 1

## 2023-01-01 MED ORDER — HEPARIN SODIUM (PORCINE) 5000 UNIT/ML IJ SOLN
5000.0000 [IU] | Freq: Three times a day (TID) | INTRAMUSCULAR | Status: DC
  Administered 2023-01-01 – 2023-01-04 (×8): 5000 [IU] via SUBCUTANEOUS
  Filled 2023-01-01 (×8): qty 1

## 2023-01-01 MED ORDER — CARVEDILOL 6.25 MG PO TABS
6.2500 mg | ORAL_TABLET | Freq: Two times a day (BID) | ORAL | Status: DC
  Administered 2023-01-01 – 2023-01-04 (×6): 6.25 mg via ORAL
  Filled 2023-01-01 (×6): qty 1

## 2023-01-01 MED ORDER — HYDRALAZINE HCL 25 MG PO TABS
25.0000 mg | ORAL_TABLET | Freq: Three times a day (TID) | ORAL | Status: DC
  Administered 2023-01-01 – 2023-01-03 (×8): 25 mg via ORAL
  Filled 2023-01-01 (×8): qty 1

## 2023-01-01 NOTE — Progress Notes (Signed)
Mobility Specialist Progress Note:  Nurse requested Mobility Specialist to perform oxygen saturation test with pt which includes removing pt from oxygen both at rest and while ambulating.  Below are the results from that testing.     Patient Saturations on Room Air at Rest = spO2 99%  Patient Saturations on Room Air while Ambulating = sp02 96% .  At end of testing pt left in room on 0  Liters of oxygen.  Reported results to nurse.    Thompson Grayer Mobility Specialist  Please contact vis Secure Chat or  Rehab Office [phone]

## 2023-01-01 NOTE — Progress Notes (Addendum)
ANTICOAGULATION CONSULT NOTE   Pharmacy Consult for Heparin Indication: chest pain/ACS  No Known Allergies  Patient Measurements: Height: 5\' 2"  (157.5 cm) Weight: 63.1 kg (139 lb 1.8 oz) IBW/kg (Calculated) : 54.6 Heparin Dosing Weight: 66 kg  Vital Signs: Temp: 99.8 F (37.7 C) (11/26 0044) Temp Source: Oral (11/26 0044) BP: 123/67 (11/26 0044) Pulse Rate: 85 (11/26 0044)  Labs: Recent Labs    12/30/22 0901 12/30/22 0905 12/30/22 0906 12/30/22 1033 12/30/22 1104 12/30/22 2031 12/31/22 0540 12/31/22 1334 12/31/22 2240 01/01/23 0042  HGB 12.9*  --  12.9*  12.9* 11.5*  --   --  11.6*  --   --   --   HCT 38.0*  --  38.0*  38.0* 36.4*  --   --  35.6*  --   --   --   PLT  --   --   --  215  --   --  212  --   --   --   HEPARINUNFRC  --   --   --   --   --    < > 0.38 0.24* >1.10* 0.63  CREATININE 3.10*  --  3.20*  --   --   --  4.03*  --   --   --   TROPONINIHS  --  1,392*  --   --  1,322*  --   --   --   --   --    < > = values in this interval not displayed.    Estimated Creatinine Clearance: 12.8 mL/min (A) (by C-G formula based on SCr of 4.03 mg/dL (H)).  Assessment: 10 yom with a history of HF, CAD, DM. Patient is presenting with SOB . Heparin per pharmacy consult placed for chest pain/ACS. Patient is not on anticoagulation prior to arrival.  Heparin level came back therapeutic at 0.63 after dose increase to 1200 units/hr. Level was re-drawn after discovered original follow up level was drawn incorrectly. Hgb 11.6, plt 212. No s/sx of bleeding or infusion issues.   Goal of Therapy:  Heparin level 0.3-0.7 units/ml Monitor platelets by anticoagulation protocol: Yes   Plan:  -Continue heparin infusion at 1200 units/hr  -Monitor daily HL, CBC, and for s/sx of bleeding  -Plan for 48 hours total of heparin (stop time placed)  Thank you for allowing pharmacy to participate in this patient's care,  Arabella Merles, PharmD. Clinical Pharmacist 01/01/2023 1:47 AM

## 2023-01-01 NOTE — Plan of Care (Signed)
Problem: Education: Goal: Ability to describe self-care measures that may prevent or decrease complications (Diabetes Survival Skills Education) will improve Outcome: Progressing   Problem: Education: Goal: Individualized Educational Video(s) Outcome: Progressing   Problem: Coping: Goal: Ability to adjust to condition or change in health will improve Outcome: Progressing   Problem: Fluid Volume: Goal: Ability to maintain a balanced intake and output will improve Outcome: Progressing   Problem: Health Behavior/Discharge Planning: Goal: Ability to identify and utilize available resources and services will improve Outcome: Progressing   Problem: Health Behavior/Discharge Planning: Goal: Ability to manage health-related needs will improve Outcome: Progressing

## 2023-01-01 NOTE — Plan of Care (Signed)
Problem: Fluid Volume: Goal: Ability to maintain a balanced intake and output will improve Outcome: Progressing   Problem: Activity: Goal: Risk for activity intolerance will decrease Outcome: Progressing

## 2023-01-01 NOTE — Progress Notes (Signed)
Mobility Specialist Progress Note:    01/01/23 1343  Mobility  Activity Ambulated with assistance in hallway  Level of Assistance Contact guard assist, steadying assist  Assistive Device Front wheel walker  Distance Ambulated (ft) 200 ft  Activity Response Tolerated well  Mobility Referral Yes  $Mobility charge 1 Mobility  Mobility Specialist Start Time (ACUTE ONLY) 1110  Mobility Specialist Stop Time (ACUTE ONLY) 1125  Mobility Specialist Time Calculation (min) (ACUTE ONLY) 15 min   Received pt in bed having no complaints and agreeable to mobility. Pt was asymptomatic throughout ambulation, no SOB throughout. Returned to room w/o fault. Left in bed w/ call bell in reach and all needs met.   Thompson Grayer Mobility Specialist  Please contact vis Secure Chat or  Rehab Office [phone]

## 2023-01-01 NOTE — TOC Initial Note (Addendum)
Transition of Care [facility]) - Initial/Assessment Note    Patient Details  Name: Colin [name] MRN: [id number] Date of Birth: [birth year]  Transition of Care [facility]) CM/SW Contact:    Leone Haven, RN Phone Number: 01/01/2023, 1:26 PM  Clinical Narrative:                 From home with family, has PCP , Gwinda Passe and insurance on file, states has no HH services in place at this time , has a walker.  He states he used to have oxygen , but does not have it anymore.  He states he thinks he needs it again.  NCM asked Staff RN and mobility tech to check oxygen saturations.   States family member will transport him home at [employer] and family is support system, states gets medications from [facility] [employer].  Pta self ambulatory.  Per oxygen saturation check he does not  qualify for oxygen.   Expected Discharge Plan: Home/Self Care Barriers to Discharge: No Barriers Identified   Patient Goals and CMS Choice Patient states their goals for this hospitalization and ongoing recovery are:: return home with family   Choice offered to / list presented to : NA      Expected Discharge Plan and Services   Discharge Planning Services: CM Consult Post Acute Care Choice: NA Living arrangements for the past 2 months: Single Family Home                 DME Arranged: N/A DME Agency: NA       HH Arranged: NA          Prior Living Arrangements/Services Living arrangements for the past 2 months: Single Family Home Lives with:: Spouse Patient language and need for interpreter reviewed:: Yes Do you feel safe going back to the place where you live?: Yes      Need for Family Participation in Patient Care: Yes (Comment) Care giver support system in place?: Yes (comment) Current home services: DME (walker, cane) Criminal Activity/Legal Involvement Pertinent to Current Situation/Hospitalization: No - Comment as needed  Activities of Daily Living   ADL Screening (condition  at time of admission) Independently performs ADLs?: Yes (appropriate for developmental age) Is the patient deaf or have difficulty hearing?: Yes Does the patient have difficulty seeing, even when wearing glasses/contacts?: No Does the patient have difficulty concentrating, remembering, or making decisions?: No  Permission Sought/Granted Permission sought to share information with : Case Manager Permission granted to share information with : Yes, Verbal Permission Granted              Emotional Assessment Appearance:: Appears stated age Attitude/Demeanor/Rapport: Engaged Affect (typically observed): Appropriate Orientation: : Oriented to Self, Oriented to Place, Oriented to  Time, Oriented to Situation Alcohol / Substance Use: Not Applicable Psych Involvement: No (comment)  Admission diagnosis:  NSTEMI (non-ST elevated myocardial infarction) (HCC) [I21.4] Acute respiratory failure with hypoxia (HCC) [J96.01] Acute on chronic congestive heart failure, unspecified heart failure type (HCC) [I50.9] Patient Active Problem List   Diagnosis Date Noted   Acute respiratory failure with hypoxia (HCC) 12/30/2022   Hypoglycemia 12/07/2022   Failure to thrive in adult 12/06/2022   Epigastric pain 11/24/2022   GERD (gastroesophageal reflux disease) 11/24/2022   CKD (chronic kidney disease) stage 4, GFR 15-29 ml/min (HCC) 11/24/2022   Acute on chronic combined systolic and diastolic CHF (congestive heart failure) (HCC) 11/23/2022   Injury of right shoulder 03/02/2022   Sinus bradycardia 01/22/2022  Near syncope 01/22/2022   COPD (chronic obstructive pulmonary disease) (HCC) 12/21/2021   Protein-calorie malnutrition, severe 08/29/2020   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   Coronary artery disease 08/23/2020   HFrEF (heart failure with reduced ejection fraction) (HCC)    Sinus pause    NSTEMI (non-ST elevated myocardial infarction) (HCC)    Smoking    Thoracic aorta atherosclerosis (HCC)     Essential hypertension 10/07/2019   Type 2 diabetes mellitus with hyperlipidemia (HCC) 04/26/2010   HLD (hyperlipidemia) 04/26/2010   PCP:  Grayce Sessions, NP Pharmacy:   [facility] [address] [place] [address] Phone: [phone] Fax: [phone]  [facility] [place], [place] - [address] [place] [address] Phone: [phone] Fax: [phone]  [facility] [address] [place] [address] Phone: [phone] Fax: [phone]     Social Determinants of Health (SDOH) Social History: SDOH Screenings   Food Insecurity: No Food Insecurity (12/30/2022)  Housing: Low Risk  (12/30/2022)  Transportation Needs: No Transportation Needs (12/30/2022)  Utilities: Not At Risk (12/30/2022)  Depression (PHQ2-9): High Risk (01/11/2022)  Tobacco Use: Medium Risk (12/30/2022)   SDOH Interventions:     Readmission Risk Interventions    01/01/2023   12:57 PM 12/07/2022    4:35 PM  Readmission Risk Prevention Plan  Transportation Screening Complete Complete  PCP or Specialist Appt within 5-7 Days  Complete  PCP or Specialist Appt within 3-5 Days Complete   Home Care Screening  Complete  Medication Review (RN CM)  Complete  HRI or Home Care Consult Complete   Palliative Care Screening Not Applicable   Medication Review (RN Care Manager) Complete

## 2023-01-01 NOTE — Progress Notes (Signed)
PROGRESS NOTE    Colin [name]  GMW:[id number] DOB: 10-23-1950 DOA: 12/30/2022 PCP: Grayce Sessions, NP    Brief Narrative:  This 72 yrs old male with PMH significant for hypertension, hyperlipidemia, diabetes, CAD status post CABG, GERD, CKD 4, COPD, bradycardia, hypothyroidism, CHF presented with worsening shortness of breath/respiratory distress. Patient was noted to be hypoxic on arrival in the ED with SpO2 of 70% with increased work of breathing. He was placed on BiPAP.  Family reports patient has been noncompliant with his medications.  e was significantly hypertensive in the ED 190/117.  BNP was significantly elevated 2342, troponin elevated 1392 > 1322.  Chest x-ray showed evidence of CHF with patchy airspace opacity which could represent pneumonia versus pulmonary edema.  Patient has received aspirin, Lasix, nitro infusion, heparin infusion and was admitted.  Cardiology is consulted,  recommended medical management for now.  Assessment & Plan:   Principal Problem:   Acute respiratory failure with hypoxia (HCC) Active Problems:   Type 2 diabetes mellitus with hyperlipidemia (HCC)   Acute on chronic combined systolic and diastolic CHF (congestive heart failure) (HCC)   Essential hypertension   CKD (chronic kidney disease) stage 4, GFR 15-29 ml/min (HCC)   Coronary artery disease   GERD (gastroesophageal reflux disease)   COPD (chronic obstructive pulmonary disease) (HCC)   HLD (hyperlipidemia)   NSTEMI (non-ST elevated myocardial infarction) (HCC)   S/P CABG x 3   Hypoglycemia   Acute on chronic hypoxic respiratory failure: Likely multifactorial Acute on chronic combined systolic and diastolic CHF,  Patient with known history of systolic CHF, LVEF 25 to 30%,  Hx. of CAD Patient presented with respiratory distress with significant desaturation. Spo2 70% on arrival. He was placed on BiPAP, subsequently weaned down to 2 L of supplemental oxygen X-ray consistent pulmonary  edema, BNP elevated to 2342 Also noted to have troponin trend of 1392, 1322.  Magnesium normal. EKG difficult to interpret due to baseline wander but may have ST depression in lateral leads.   Suspect NSTEMI but unclear if type II versus type I.  Patient did report chest pain in the morning which is now resolved. Family reports patient is not taking medications regularly. Continue IV Lasix 80 mg IV every 12 hrs daily Patient initiated on nitroglycerin infusion for hypertension but subsequently stopped as blood pressure improved Cardiology consulted, recommended medical management with heparin for 48 hours. Completed IV heparin for 48 hours as per cardiology. Continue home Imdur, Coreg.   Acute on chronic combined systolic and diastolic CHF: Continue with-Lasix 80 mg IV twice daily Strict I's and O's, daily weights. Cardiology on board.  Continue IV Lasix for 1 more day then changed to oral Lasix.  Intake/Output Summary (Last 24 hours) at 01/01/2023 1407 Last data filed at 01/01/2023 1200 Gross per 24 hour  Intake 839.2 ml  Output 2425 ml  Net -1585.8 ml     NSTEMI: Completed IV heparin for 48 hours. Nitroglycerin discontinued as blood pressure improved and chest pain resolved Continue  Plavix, Coreg Continue atorvastatin Recent lipid panel last month with cholesterol 384, LDL 323.   Hypertension: Continue home Imdur, Coreg. Continue hydralazine 25 mg 3 times daily.   Hyperlipidemia: Continue Lipitor 80 mg daily.   Diabetes Mellitus ii: Hyperglycemic on arrival. Continue regular insulin sliding scale   GERD: Continue pantoprazole 40 mg daily.   CKD 4: Creatinine of 3.11 which is near baseline of 2.7-3. Trend renal function and electrolytes.   History of COPD: Noted in  chart but do not see any inhalers.   Hypothyroidism: Continue Synthroid.   DVT prophylaxis: Heparin IV Code Status: Full code Family Communication: No family at bed side Disposition Plan:   Status is: Inpatient Remains inpatient appropriate because:    Admitted for NSTEMI, Cardiology recommended medical management with IV heparin for 48 hours.  Consultants:  Cardiology  Procedures:  Antimicrobials:  Anti-infectives (From admission, onward)    None      Subjective: Patient was seen and examined at bedside.  Overnight events noted.    Patient reports feeling improved.  Denies any chest pain.  Objective: Vitals:   01/01/23 0734 01/01/23 1147 01/01/23 1343 01/01/23 1404  BP: (!) 157/66 137/76    Pulse: 86 86    Resp: 18 18    Temp: 99.1 F (37.3 C) 98.6 F (37 C)    TempSrc: Oral Oral    SpO2: 99% 98% 95% 93%  Weight:      Height:        Intake/Output Summary (Last 24 hours) at 01/01/2023 1407 Last data filed at 01/01/2023 1200 Gross per 24 hour  Intake 839.2 ml  Output 2425 ml  Net -1585.8 ml   Filed Weights   12/30/22 1510 12/31/22 0310 01/01/23 0505  Weight: 64.4 kg 63.1 kg 64.4 kg    Examination:  General exam: Appears calm and comfortable, not in any acute distress Respiratory system: Clear to auscultation. Respiratory effort normal.  RR 16 Cardiovascular system: S1 & S2 heard, RRR. No JVD, murmurs, rubs, gallops or clicks. No pedal edema. Gastrointestinal system: Abdomen is non distended, soft and no tender. Normal bowel sounds heard. Central nervous system: Alert and oriented x 3. No focal neurological deficits. Extremities: Symmetric 5 x 5 power. Skin: No rashes, lesions or ulcers Psychiatry: Judgement and insight appear normal. Mood & affect appropriate.     Data Reviewed: I have personally reviewed following labs and imaging studies  CBC: Recent Labs  Lab 12/30/22 0901 12/30/22 0906 12/30/22 1033 12/31/22 0540 01/01/23 0645  WBC  --   --  14.8* 10.1 9.6  HGB 12.9* 12.9*  12.9* 11.5* 11.6* 10.7*  HCT 38.0* 38.0*  38.0* 36.4* 35.6* 32.5*  MCV  --   --  95.5 92.2 91.5  PLT  --   --  215 212 224   Basic Metabolic  Panel: Recent Labs  Lab 12/30/22 0855 12/30/22 0901 12/30/22 0906 12/31/22 0540 01/01/23 0645  NA 142 143 142  142 135 141  K 3.7 3.8 3.8  3.8 3.8 3.6  CL 110 113* 115* 107 109  CO2 18*  --   --  20* 23  GLUCOSE 36* 42* 42* 310* 166*  BUN 38* 39* 43* 48* 46*  CREATININE 3.11* 3.10* 3.20* 4.03* 3.95*  CALCIUM 7.8*  --   --  7.6* 8.0*  MG 2.0  --   --   --   --   PHOS  --   --   --   --  3.4   GFR: Estimated Creatinine Clearance: 13.1 mL/min (A) (by C-G formula based on SCr of 3.95 mg/dL (H)). Liver Function Tests: Recent Labs  Lab 12/30/22 0855 12/31/22 0540 01/01/23 0645  AST 26 31  --   ALT 14 14  --   ALKPHOS 76 60  --   BILITOT 0.5 0.5  --   PROT 4.6* 5.1*  --   ALBUMIN 1.8* <1.5* <1.5*   No results for input(s): "LIPASE", "AMYLASE" in the last 168 hours.  No results for input(s): "AMMONIA" in the last 168 hours. Coagulation Profile: No results for input(s): "INR", "PROTIME" in the last 168 hours. Cardiac Enzymes: No results for input(s): "CKTOTAL", "CKMB", "CKMBINDEX", "TROPONINI" in the last 168 hours. BNP (last 3 results) No results for input(s): "PROBNP" in the last 8760 hours. HbA1C: No results for input(s): "HGBA1C" in the last 72 hours. CBG: Recent Labs  Lab 12/31/22 1123 12/31/22 1619 12/31/22 2040 01/01/23 0636 01/01/23 1144  GLUCAP 205* 249* 197* 144* 238*   Lipid Profile: No results for input(s): "CHOL", "HDL", "LDLCALC", "TRIG", "CHOLHDL", "LDLDIRECT" in the last 72 hours. Thyroid Function Tests: No results for input(s): "TSH", "T4TOTAL", "FREET4", "T3FREE", "THYROIDAB" in the last 72 hours. Anemia Panel: No results for input(s): "VITAMINB12", "FOLATE", "FERRITIN", "TIBC", "IRON", "RETICCTPCT" in the last 72 hours. Sepsis Labs: No results for input(s): "PROCALCITON", "LATICACIDVEN" in the last 168 hours.  No results found for this or any previous visit (from the past 240 hour(s)).   Radiology Studies: No results found.  Scheduled  Meds:  atorvastatin  80 mg Oral Daily   carvedilol  6.25 mg Oral BID WC   clopidogrel  75 mg Oral Daily   furosemide  80 mg Intravenous BID   guaiFENesin  600 mg Oral BID   heparin injection (subcutaneous)  5,000 Units Subcutaneous Q8H   hydrALAZINE  25 mg Oral Q8H   insulin aspart  0-6 Units Subcutaneous TID WC   insulin glargine-yfgn  10 Units Subcutaneous Daily   isosorbide mononitrate  60 mg Oral Daily   levothyroxine  25 mcg Oral Q0600   pantoprazole  20 mg Oral Daily   sodium chloride flush  3 mL Intravenous Q12H   Continuous Infusions:     LOS: 1 day    Time spent: 35 mins    Willeen Niece, MD [facility]   If 7PM-7AM, please contact night-coverage

## 2023-01-01 NOTE — Progress Notes (Signed)
Progress Note  Patient Name: Colin [name] Date of Encounter: 01/01/2023  Primary Cardiologist: [name], MD   Subjective   Patient seen and examined at her his bedside.   Inpatient Medications    Scheduled Meds:  atorvastatin  80 mg Oral Daily   carvedilol  3.125 mg Oral BID WC   clopidogrel  75 mg Oral Daily   furosemide  80 mg Intravenous BID   influenza vaccine adjuvanted  0.5 mL Intramuscular Tomorrow-1000   insulin aspart  0-6 Units Subcutaneous TID WC   insulin glargine-yfgn  10 Units Subcutaneous Daily   isosorbide mononitrate  60 mg Oral Daily   levothyroxine  25 mcg Oral Q0600   pantoprazole  20 mg Oral Daily   pneumococcal 20-valent conjugate vaccine  0.5 mL Intramuscular Tomorrow-1000   sodium chloride flush  3 mL Intravenous Q12H   Continuous Infusions:  heparin 1,200 Units/hr (01/01/23 0711)   PRN Meds: acetaminophen **OR** acetaminophen, ipratropium-albuterol, polyethylene glycol   Vital Signs    Vitals:   12/31/22 1916 01/01/23 0044 01/01/23 0505 01/01/23 0734  BP: 130/65 123/67 116/79 (!) 157/66  Pulse: 81 85 84 86  Resp: 20 20 17 18   Temp: 98 F (36.7 C) 99.8 F (37.7 C) 98.9 F (37.2 C) 99.1 F (37.3 C)  TempSrc: Oral Oral Oral Oral  SpO2: 98% 100% 100% 99%  Weight:   64.4 kg   Height:        Intake/Output Summary (Last 24 hours) at 01/01/2023 0919 Last data filed at 01/01/2023 0916 Gross per 24 hour  Intake 1079.2 ml  Output 2375 ml  Net -1295.8 ml   Filed Weights   12/30/22 1510 12/31/22 0310 01/01/23 0505  Weight: 64.4 kg 63.1 kg 64.4 kg    Telemetry    Sinus rhythm - Personally Reviewed  ECG    None  - Personally Reviewed  Physical Exam    General: Comfortable Head: Atraumatic, normal size  Eyes: PEERLA, EOMI  Neck: Supple, normal JVD Cardiac: Normal S1, S2; RRR; no murmurs, rubs, or gallops Lungs: Clear to auscultation bilaterally Abd: Soft, nontender, no hepatomegaly  Ext: warm, no  edema Musculoskeletal: No deformities, BUE and BLE strength normal and equal Skin: Warm and dry, no rashes   Neuro: Alert and oriented to person, place, time, and situation, CNII-XII grossly intact, no focal deficits  Psych: Normal mood and affect   Labs    Chemistry Recent Labs  Lab 12/30/22 0855 12/30/22 0901 12/30/22 0906 12/31/22 0540 01/01/23 0645  NA 142   < > 142  142 135 141  K 3.7   < > 3.8  3.8 3.8 3.6  CL 110   < > 115* 107 109  CO2 18*  --   --  20* 23  GLUCOSE 36*   < > 42* 310* 166*  BUN 38*   < > 43* 48* 46*  CREATININE 3.11*   < > 3.20* 4.03* 3.95*  CALCIUM 7.8*  --   --  7.6* 8.0*  PROT 4.6*  --   --  5.1*  --   ALBUMIN 1.8*  --   --  <1.5* <1.5*  AST 26  --   --  31  --   ALT 14  --   --  14  --   ALKPHOS 76  --   --  60  --   BILITOT 0.5  --   --  0.5  --   GFRNONAA 20*  --   --  15* 15*  ANIONGAP 14  --   --  8 9   < > = values in this interval not displayed.     Hematology Recent Labs  Lab 12/30/22 1033 12/31/22 0540 01/01/23 0645  WBC 14.8* 10.1 9.6  RBC 3.81* 3.86* 3.55*  HGB 11.5* 11.6* 10.7*  HCT 36.4* 35.6* 32.5*  MCV 95.5 92.2 91.5  MCH 30.2 30.1 30.1  MCHC 31.6 32.6 32.9  RDW 13.5 13.5 13.3  PLT 215 212 224    Cardiac EnzymesNo results for input(s): "TROPONINI" in the last 168 hours. No results for input(s): "TROPIPOC" in the last 168 hours.   BNP Recent Labs  Lab 12/30/22 0905  BNP 2,342.4*     DDimer No results for input(s): "DDIMER" in the last 168 hours.   Radiology    No results found.  Cardiac Studies   Echo   Patient Profile     72 y.o. male severe chronic systolic heart failure with [place] heart class IV symptoms, coronary artery disease history of CABG x 3, chronic kidney disease stage IV,  Assessment & Plan    Acute on chronic systolic heart failure CAD status post CABG x 3 Hypertension Hyperlipidemia Diabetes mellitus type 2 Chronic kidney disease stage IV  Clinically he tells me that his  breathing is improving.  He has responded to the Lasix 1298 mL output so far. I think he can still benefit from the use of IV  Lasix at least one more day - then transition to oral Lasix.   As noted yesterday his troponin was elevated but flat no chest pain but some shortness of breath which could be in the setting of his heart failure exacerbation.  But he does have high burden of coronary artery disease, have completed 48hr of heparin use. Please stop heparin.  Continue his atorvastatin and isosorbide.  Blood pressure is elevated today-continue hydralazine 25 mg 3 times daily.  Continue the current dose of carvedilol 6.25 mg twice daily.  Hyperlipidemia - continue with current statin medication.  Diabetes is being managed by the primary team       For questions or updates, please contact [facility] Please consult [email or web address] for contact info under Cardiology/STEMI.      Signed, Thomasene Ripple, DO  01/01/2023, 9:19 AM

## 2023-01-02 DIAGNOSIS — J9601 Acute respiratory failure with hypoxia: Secondary | ICD-10-CM | POA: Diagnosis not present

## 2023-01-02 LAB — RENAL FUNCTION PANEL
Albumin: <1.5 g/dL — ABNORMAL LOW (ref 3.5–5.0)
Anion gap: 9 (ref 5–15)
BUN: 47 mg/dL — ABNORMAL HIGH (ref 8–23)
CO2: 24 mmol/L (ref 22–32)
Calcium: 7.9 mg/dL — ABNORMAL LOW (ref 8.9–10.3)
Chloride: 107 mmol/L (ref 98–111)
Creatinine, Ser: 4.18 mg/dL — ABNORMAL HIGH (ref 0.61–1.24)
GFR, Estimated: 14 mL/min — ABNORMAL LOW (ref >60)
Glucose, Bld: 178 mg/dL — ABNORMAL HIGH (ref 70–99)
Phosphorus: 3.6 mg/dL (ref 2.5–4.6)
Potassium: 3.4 mmol/L — ABNORMAL LOW (ref 3.5–5.1)
Sodium: 140 mmol/L (ref 135–145)

## 2023-01-02 LAB — GLUCOSE, CAPILLARY
Glucose-Capillary: 149 mg/dL — ABNORMAL HIGH (ref 70–99)
Glucose-Capillary: 229 mg/dL — ABNORMAL HIGH (ref 70–99)
Glucose-Capillary: 118 mg/dL — ABNORMAL HIGH (ref 70–99)
Glucose-Capillary: 152 mg/dL — ABNORMAL HIGH (ref 70–99)

## 2023-01-02 MED ORDER — POTASSIUM CHLORIDE CRYS ER 20 MEQ PO TBCR
40.0000 meq | EXTENDED_RELEASE_TABLET | Freq: Once | ORAL | Status: AC
  Administered 2023-01-02: 40 meq via ORAL
  Filled 2023-01-02: qty 2

## 2023-01-02 NOTE — Progress Notes (Signed)
Progress Note  Patient Name: Colin [name] Date of Encounter: 01/02/2023  Primary Cardiologist: [name], MD   Subjective   Patient seen and examined at his bedside.  Inpatient Medications    Scheduled Meds:  atorvastatin  80 mg Oral Daily   carvedilol  6.25 mg Oral BID WC   clopidogrel  75 mg Oral Daily   furosemide  80 mg Intravenous BID   guaiFENesin  600 mg Oral BID   heparin injection (subcutaneous)  5,000 Units Subcutaneous Q8H   hydrALAZINE  25 mg Oral Q8H   insulin aspart  0-6 Units Subcutaneous TID WC   insulin glargine-yfgn  10 Units Subcutaneous Daily   isosorbide mononitrate  60 mg Oral Daily   levothyroxine  25 mcg Oral Q0600   pantoprazole  20 mg Oral Daily   potassium chloride  40 mEq Oral Once   sodium chloride flush  3 mL Intravenous Q12H   Continuous Infusions:   PRN Meds: acetaminophen **OR** acetaminophen, ipratropium-albuterol, polyethylene glycol   Vital Signs    Vitals:   01/02/23 0520 01/02/23 0625 01/02/23 0635 01/02/23 0725  BP: (!) 120/59 126/61 126/61 (!) 116/57  Pulse: 78 78    Resp: 19   17  Temp: 98.9 F (37.2 C)   98.8 F (37.1 C)  TempSrc: Oral   Oral  SpO2: 95% 100%    Weight: 62.7 kg     Height:        Intake/Output Summary (Last 24 hours) at 01/02/2023 0902 Last data filed at 01/02/2023 0518 Gross per 24 hour  Intake 323 ml  Output 1400 ml  Net -1077 ml   Filed Weights   12/31/22 0310 01/01/23 0505 01/02/23 0520  Weight: 63.1 kg 64.4 kg 62.7 kg    Telemetry    Sinus rhythm - Personally Reviewed  ECG    None  - Personally Reviewed  Physical Exam    General: Comfortable Head: Atraumatic, normal size  Eyes: PEERLA, EOMI  Neck: Supple, normal JVD Cardiac: Normal S1, S2; RRR; no murmurs, rubs, or gallops Lungs: Clear to auscultation bilaterally Abd: Soft, nontender, no hepatomegaly  Ext: warm, no edema Musculoskeletal: No deformities, BUE and BLE strength normal and equal Skin: Warm and dry,  no rashes   Neuro: Alert and oriented to person, place, time, and situation, CNII-XII grossly intact, no focal deficits  Psych: Normal mood and affect   Labs    Chemistry Recent Labs  Lab 12/30/22 0855 12/30/22 0901 12/31/22 0540 01/01/23 0645 01/02/23 0241  NA 142   < > 135 141 140  K 3.7   < > 3.8 3.6 3.4*  CL 110   < > 107 109 107  CO2 18*  --  20* 23 24  GLUCOSE 36*   < > 310* 166* 178*  BUN 38*   < > 48* 46* 47*  CREATININE 3.11*   < > 4.03* 3.95* 4.18*  CALCIUM 7.8*  --  7.6* 8.0* 7.9*  PROT 4.6*  --  5.1*  --   --   ALBUMIN 1.8*  --  <1.5* <1.5* <1.5*  AST 26  --  31  --   --   ALT 14  --  14  --   --   ALKPHOS 76  --  60  --   --   BILITOT 0.5  --  0.5  --   --   GFRNONAA 20*  --  15* 15* 14*  ANIONGAP 14  --  8 9 9    < > = values in this interval not displayed.     Hematology Recent Labs  Lab 12/30/22 1033 12/31/22 0540 01/01/23 0645  WBC 14.8* 10.1 9.6  RBC 3.81* 3.86* 3.55*  HGB 11.5* 11.6* 10.7*  HCT 36.4* 35.6* 32.5*  MCV 95.5 92.2 91.5  MCH 30.2 30.1 30.1  MCHC 31.6 32.6 32.9  RDW 13.5 13.5 13.3  PLT 215 212 224    Cardiac EnzymesNo results for input(s): "TROPONINI" in the last 168 hours. No results for input(s): "TROPIPOC" in the last 168 hours.   BNP Recent Labs  Lab 12/30/22 0905  BNP 2,342.4*     DDimer No results for input(s): "DDIMER" in the last 168 hours.   Radiology    No results found.  Cardiac Studies   Echo   Patient Profile     72 y.o. male severe chronic systolic heart failure with [place] heart class IV symptoms, coronary artery disease history of CABG x 3, chronic kidney disease stage IV,  Assessment & Plan    Acute on chronic systolic heart failure CAD status post CABG x 3 Hypertension Hyperlipidemia Diabetes mellitus type 2 Chronic kidney disease stage IV  Clinically he tells me that his breathing is improving.  Net negative 1077 ml,  it is hard to assess volume status on this patient. Plan to  transition to oral Lasix tomorrow am.   He needs to walk, he has been in bed this hospital stay so far.  As noted previously, his troponin was elevated but flat no chest pain but some shortness of breath which could be in the setting of his heart failure exacerbation.  Continue his atorvastatin and isosorbide. Completed 48hrs heparin therapy. No ischemic eval at this time is indicated.  Blood pressure is at target, continue current regimen.   Hyperlipidemia - continue with current statin medication.  Diabetes is being managed by the primary team    For questions or updates, please contact [facility] Please consult [email or web address] for contact info under Cardiology/STEMI.   Signed, Yina Riviere, DO  01/02/2023, 9:02 AM

## 2023-01-02 NOTE — Progress Notes (Signed)
PROGRESS NOTE    Colin [name]  BMW:[id number] DOB: 11-18-1950 DOA: 12/30/2022 PCP: Grayce Sessions, NP     Brief Narrative:  Colin [name] is a 72 yrs old male with PMH significant for hypertension, hyperlipidemia, diabetes, CAD status post CABG, GERD, CKD 4, COPD, bradycardia, hypothyroidism, CHF presented with worsening shortness of breath/respiratory distress. Patient was noted to be hypoxic on arrival in the ED with SpO2 of 70% with increased work of breathing. He was placed on BiPAP.  Family reports patient has been noncompliant with his medications.  He was significantly hypertensive in the ED 190/117.  BNP was significantly elevated 2342, troponin elevated 1392 > 1322.  Chest x-ray showed evidence of CHF with patchy airspace opacity which could represent pneumonia versus pulmonary edema.  Patient has received aspirin, Lasix, nitro infusion, heparin infusion and was admitted.  Cardiology was consulted,  recommended medical management for now.  New events last 24 hours / Subjective: Patient without any new complaints, denies any shortness of breath today.  States that he feels well on oxygen.  Assessment & Plan:   Principal Problem:   Acute respiratory failure with hypoxia (HCC) Active Problems:   Type 2 diabetes mellitus with hyperlipidemia (HCC)   Acute on chronic combined systolic and diastolic CHF (congestive heart failure) (HCC)   Essential hypertension   CKD (chronic kidney disease) stage 4, GFR 15-29 ml/min (HCC)   Coronary artery disease   GERD (gastroesophageal reflux disease)   COPD (chronic obstructive pulmonary disease) (HCC)   HLD (hyperlipidemia)   NSTEMI (non-ST elevated myocardial infarction) (HCC)   S/P CABG x 3   Hypoglycemia   Acute hypoxic respiratory failure -Initially required BiPAP, currently weaned down to nasal cannula O2  Acute on chronic combined systolic and diastolic CHF -EF 25 to 30% -BNP 2342 -Appreciate cardiology following, remains on IV  Lasix today.  Discussed with Dr. Servando Salina -Coreg -Strict I's and O's, daily weight  AKI on CKD stage IV -Baseline creatinine 2.7-3 -Continue to monitor closely in setting of CHF and IV Lasix use  NSTEMI -Completed IV heparin for 48 hours -Could be in setting of CHF exacerbation.  No ischemic eval at this time -Plavix, Coreg, Imdur, Lipitor  Hypertension -Imdur, Coreg, hydralazine  Hyperlipidemia -Lipitor  Diabetes mellitus -Semglee, sliding scale insulin  Hypothyroidism -Synthroid  GERD -PPI  Hypokalemia -Replace   DVT prophylaxis:  heparin injection 5,000 Units Start: 01/01/23 2200  Code Status: Full code Family Communication: None Disposition Plan: Home Status is: Inpatient Remains inpatient appropriate because: IV Lasix today.  PT    Antimicrobials:  Anti-infectives (From admission, onward)    None        Objective: Vitals:   01/02/23 0625 01/02/23 0635 01/02/23 0725 01/02/23 1044  BP: 126/61 126/61 (!) 116/57 131/73  Pulse: 78   75  Resp:   17 18  Temp:   98.8 F (37.1 C) 98.4 F (36.9 C)  TempSrc:   Oral Oral  SpO2: 100%   96%  Weight:      Height:        Intake/Output Summary (Last 24 hours) at 01/02/2023 1121 Last data filed at 01/02/2023 1042 Gross per 24 hour  Intake 560 ml  Output 1400 ml  Net -840 ml   Filed Weights   12/31/22 0310 01/01/23 0505 01/02/23 0520  Weight: 63.1 kg 64.4 kg 62.7 kg    Examination:  General exam: Appears calm and comfortable  Respiratory system: Clear to auscultation. Respiratory effort normal. No respiratory  distress. No conversational dyspnea.  On nasal cannula O2 Cardiovascular system: S1 & S2 heard, RRR. No murmurs. No pedal edema. Gastrointestinal system: Abdomen is nondistended, soft and nontender. Normal bowel sounds heard. Central nervous system: Alert  Extremities: Symmetric in appearance  Skin: No rashes, lesions or ulcers on exposed skin  Psychiatry: Judgement and insight appear normal.  Mood & affect appropriate.   Data Reviewed: I have personally reviewed following labs and imaging studies  CBC: Recent Labs  Lab 12/30/22 0901 12/30/22 0906 12/30/22 1033 12/31/22 0540 01/01/23 0645  WBC  --   --  14.8* 10.1 9.6  HGB 12.9* 12.9*  12.9* 11.5* 11.6* 10.7*  HCT 38.0* 38.0*  38.0* 36.4* 35.6* 32.5*  MCV  --   --  95.5 92.2 91.5  PLT  --   --  215 212 224   Basic Metabolic Panel: Recent Labs  Lab 12/30/22 0855 12/30/22 0901 12/30/22 0906 12/31/22 0540 01/01/23 0645 01/02/23 0241  NA 142 143 142  142 135 141 140  K 3.7 3.8 3.8  3.8 3.8 3.6 3.4*  CL 110 113* 115* 107 109 107  CO2 18*  --   --  20* 23 24  GLUCOSE 36* 42* 42* 310* 166* 178*  BUN 38* 39* 43* 48* 46* 47*  CREATININE 3.11* 3.10* 3.20* 4.03* 3.95* 4.18*  CALCIUM 7.8*  --   --  7.6* 8.0* 7.9*  MG 2.0  --   --   --   --   --   PHOS  --   --   --   --  3.4 3.6   GFR: Estimated Creatinine Clearance: 12.3 mL/min (A) (by C-G formula based on SCr of 4.18 mg/dL (H)). Liver Function Tests: Recent Labs  Lab 12/30/22 0855 12/31/22 0540 01/01/23 0645 01/02/23 0241  AST 26 31  --   --   ALT 14 14  --   --   ALKPHOS 76 60  --   --   BILITOT 0.5 0.5  --   --   PROT 4.6* 5.1*  --   --   ALBUMIN 1.8* <1.5* <1.5* <1.5*   No results for input(s): "LIPASE", "AMYLASE" in the last 168 hours. No results for input(s): "AMMONIA" in the last 168 hours. Coagulation Profile: No results for input(s): "INR", "PROTIME" in the last 168 hours. Cardiac Enzymes: No results for input(s): "CKTOTAL", "CKMB", "CKMBINDEX", "TROPONINI" in the last 168 hours. BNP (last 3 results) No results for input(s): "PROBNP" in the last 8760 hours. HbA1C: No results for input(s): "HGBA1C" in the last 72 hours. CBG: Recent Labs  Lab 01/01/23 1144 01/01/23 1547 01/01/23 2136 01/02/23 0624 01/02/23 1046  GLUCAP 238* 164* 205* 149* 229*   Lipid Profile: No results for input(s): "CHOL", "HDL", "LDLCALC", "TRIG", "CHOLHDL",  "LDLDIRECT" in the last 72 hours. Thyroid Function Tests: No results for input(s): "TSH", "T4TOTAL", "FREET4", "T3FREE", "THYROIDAB" in the last 72 hours. Anemia Panel: No results for input(s): "VITAMINB12", "FOLATE", "FERRITIN", "TIBC", "IRON", "RETICCTPCT" in the last 72 hours. Sepsis Labs: No results for input(s): "PROCALCITON", "LATICACIDVEN" in the last 168 hours.  No results found for this or any previous visit (from the past 240 hour(s)).    Radiology Studies: No results found.    Scheduled Meds:  atorvastatin  80 mg Oral Daily   carvedilol  6.25 mg Oral BID WC   clopidogrel  75 mg Oral Daily   furosemide  80 mg Intravenous BID   guaiFENesin  600 mg Oral BID  heparin injection (subcutaneous)  5,000 Units Subcutaneous Q8H   hydrALAZINE  25 mg Oral Q8H   insulin aspart  0-6 Units Subcutaneous TID WC   insulin glargine-yfgn  10 Units Subcutaneous Daily   isosorbide mononitrate  60 mg Oral Daily   levothyroxine  25 mcg Oral Q0600   pantoprazole  20 mg Oral Daily   sodium chloride flush  3 mL Intravenous Q12H   Continuous Infusions:   LOS: 2 days   Time spent: 35 minutes   Noralee Stain, DO [facility] 01/02/2023, 11:21 AM   Available via Epic secure chat 7am-7pm After these hours, please refer to coverage provider listed on [email or web address]

## 2023-01-02 NOTE — Evaluation (Signed)
Physical Therapy Evaluation Patient Details Name: Colin [name] MRN: [id number] DOB: 07-01-1950 Today's Date: 01/02/2023  History of Present Illness  72 y.o. male presents to [facility] hospital on 12/30/2022 with SOB and respiratory distress. Pt admitted for management of acute hypoxic respiratory failure 2/2 CHF exacerbation. Pt also with NSTEMI. PMH includes: CAD s/p stent x2, DM II, and HTN.  Clinical Impression  Pt presents to PT with deficits in activity tolerance. Pt is able to ambulate for household distances with increased time and reports of DOE. Pt's oxygen saturation remains stable, 95% and above, while ambulating on room air. Pt is encouraged to mobilize frequently in an effort to improve activity tolerance. PT recommends discharge home when medically appropriate.        If plan is discharge home, recommend the following: Help with stairs or ramp for entrance   Can travel by private vehicle        Equipment Recommendations None recommended by PT  Recommendations for Other Services       Functional Status Assessment Patient has had a recent decline in their functional status and demonstrates the ability to make significant improvements in function in a reasonable and predictable amount of time.     Precautions / Restrictions Precautions Precautions: Fall Restrictions Weight Bearing Restrictions: No      Mobility  Bed Mobility Overal bed mobility: Independent                  Transfers Overall transfer level: Modified independent Equipment used: Rolling walker (2 wheels)                    Ambulation/Gait Ambulation/Gait assistance: Supervision Gait Distance (Feet): 150 Feet Assistive device: Rolling walker (2 wheels) Gait Pattern/deviations: Step-through pattern Gait velocity: reduced Gait velocity interpretation: <1.8 ft/sec, indicate of risk for recurrent falls   General Gait Details: slowed step-through gait  Stairs             Wheelchair Mobility     Tilt Bed    Modified Rankin (Stroke Patients Only)       Balance Overall balance assessment: Needs assistance Sitting-balance support: No upper extremity supported, Feet supported Sitting balance-Leahy Scale: Good     Standing balance support: Single extremity supported, Reliant on assistive device for balance Standing balance-Leahy Scale: Poor                               Pertinent Vitals/Pain Pain Assessment Pain Assessment: No/denies pain    Home Living Family/patient expects to be discharged to:: Private residence Living Arrangements: Spouse/significant other;Other relatives Available Help at Discharge: Family;Available PRN/intermittently Type of Home: House Home Access: Stairs to enter Entrance Stairs-Rails: None Entrance Stairs-Number of Steps: 2 Alternate Level Stairs-Number of Steps: flight Home Layout: Multi-level;Able to live on main level with bedroom/bathroom Home Equipment: Rolling Walker (2 wheels);Shower seat      Prior Function Prior Level of Function : Independent/Modified Independent;Driving             Mobility Comments: pt reports utilizing a walker if outside the home, although he denies ambulating out of the home much. Typically ambulates laps within the home for exercise       Extremity/Trunk Assessment   Upper Extremity Assessment Upper Extremity Assessment: Overall WFL for tasks assessed    Lower Extremity Assessment Lower Extremity Assessment: Overall WFL for tasks assessed    Cervical / Trunk Assessment Cervical / Trunk Assessment:  Normal  Communication   Communication Communication: No apparent difficulties Cueing Techniques: Verbal cues  Cognition Arousal: Alert Behavior During Therapy: WFL for tasks assessed/performed Overall Cognitive Status: Within Functional Limits for tasks assessed                                          General Comments General  comments (skin integrity, edema, etc.): VSS on RA, sats from 95-99%    Exercises     Assessment/Plan    PT Assessment Patient needs continued PT services  PT Problem List Decreased activity tolerance;Decreased mobility;Cardiopulmonary status limiting activity       PT Treatment Interventions DME instruction;Gait training;Stair training;Functional mobility training;Balance training;Patient/family education;Therapeutic exercise;Therapeutic activities    PT Goals (Current goals can be found in the Care Plan section)  Acute Rehab PT Goals Patient Stated Goal: to improve breathing when mobilizing PT Goal Formulation: With patient Time For Goal Achievement: 01/16/23 Potential to Achieve Goals: Good Additional Goals Additional Goal #1: Pt will report 0/4 DOE when ambulating for >200' to demonstrate improved activity tolerance    Frequency Min 1X/week     Co-evaluation               AM-PAC PT "6 Clicks" Mobility  Outcome Measure Help needed turning from your back to your side while in a flat bed without using bedrails?: None Help needed moving from lying on your back to sitting on the side of a flat bed without using bedrails?: None Help needed moving to and from a bed to a chair (including a wheelchair)?: None Help needed standing up from a chair using your arms (e.g., wheelchair or bedside chair)?: None Help needed to walk in hospital room?: A Little Help needed climbing 3-5 steps with a railing? : A Little 6 Click Score: 22    End of Session   Activity Tolerance: Patient tolerated treatment well Patient left: in bed;with call bell/phone within reach;with bed alarm set Nurse Communication: Mobility status PT Visit Diagnosis: Other abnormalities of gait and mobility (R26.89)    Time: 1300-1320 PT Time Calculation (min) (ACUTE ONLY): 20 min   Charges:   PT Evaluation $PT Eval Low Complexity: 1 Low   PT General Charges $$ ACUTE PT VISIT: 1 Visit         Arlyss Gandy, PT, DPT Acute Rehabilitation Office [phone]   Arlyss Gandy 01/02/2023, 2:00 PM

## 2023-01-02 NOTE — Progress Notes (Signed)
Mobility Specialist Progress Note:   01/02/23 1030  Mobility  Activity Ambulated with assistance in hallway  Level of Assistance Contact guard assist, steadying assist  Assistive Device Front wheel walker  Distance Ambulated (ft) 50 ft  Activity Response Tolerated fair  Mobility Referral Yes  $Mobility charge 1 Mobility  Mobility Specialist Start Time (ACUTE ONLY) 1030  Mobility Specialist Stop Time (ACUTE ONLY) 1045  Mobility Specialist Time Calculation (min) (ACUTE ONLY) 15 min   Pt agreeable to mobility session with min encouragement d/t fatigue post- large BM. Required only minG assist during ambulation with RW. Pt back in bed with all needs met, alarm on.   Addison Lank Mobility Specialist Please contact via SecureChat or  Rehab office at [phone]

## 2023-01-03 DIAGNOSIS — J9601 Acute respiratory failure with hypoxia: Secondary | ICD-10-CM | POA: Diagnosis not present

## 2023-01-03 LAB — RENAL FUNCTION PANEL
Albumin: <1.5 g/dL — ABNORMAL LOW (ref 3.5–5.0)
Anion gap: 10 (ref 5–15)
BUN: 56 mg/dL — ABNORMAL HIGH (ref 8–23)
CO2: 21 mmol/L — ABNORMAL LOW (ref 22–32)
Calcium: 7.8 mg/dL — ABNORMAL LOW (ref 8.9–10.3)
Chloride: 106 mmol/L (ref 98–111)
Creatinine, Ser: 4.51 mg/dL — ABNORMAL HIGH (ref 0.61–1.24)
GFR, Estimated: 13 mL/min — ABNORMAL LOW (ref >60)
Glucose, Bld: 105 mg/dL — ABNORMAL HIGH (ref 70–99)
Phosphorus: 3.1 mg/dL (ref 2.5–4.6)
Potassium: 3.6 mmol/L (ref 3.5–5.1)
Sodium: 137 mmol/L (ref 135–145)

## 2023-01-03 LAB — GLUCOSE, CAPILLARY
Glucose-Capillary: 125 mg/dL — ABNORMAL HIGH (ref 70–99)
Glucose-Capillary: 88 mg/dL (ref 70–99)
Glucose-Capillary: 162 mg/dL — ABNORMAL HIGH (ref 70–99)
Glucose-Capillary: 145 mg/dL — ABNORMAL HIGH (ref 70–99)

## 2023-01-03 MED ORDER — TORSEMIDE 20 MG PO TABS: 20.0000 mg | ORAL_TABLET | Freq: Every day | ORAL | Status: DC

## 2023-01-03 NOTE — Plan of Care (Signed)
  Problem: Education: Goal: Knowledge of General Education information will improve Description Including pain rating scale, medication(s)/side effects and non-pharmacologic comfort measures Outcome: Progressing   

## 2023-01-03 NOTE — Progress Notes (Signed)
PROGRESS NOTE    Colin [name]  GEX:[id number] DOB: 01/29/1951 DOA: 12/30/2022 PCP: Grayce Sessions, NP     Brief Narrative:  Colin [name] is a 73 yrs old male with PMH significant for hypertension, hyperlipidemia, diabetes, CAD status post CABG, GERD, CKD 4, COPD, bradycardia, hypothyroidism, CHF presented with worsening shortness of breath/respiratory distress. Patient was noted to be hypoxic on arrival in the ED with SpO2 of 70% with increased work of breathing. He was placed on BiPAP.  Family reports patient has been noncompliant with his medications.  He was significantly hypertensive in the ED 190/117.  BNP was significantly elevated 2342, troponin elevated 1392 > 1322.  Chest x-ray showed evidence of CHF with patchy airspace opacity which could represent pneumonia versus pulmonary edema.  Patient has received aspirin, Lasix, nitro infusion, heparin infusion and was admitted.  Cardiology was consulted,  recommended medical management for now.  New events last 24 hours / Subjective: Still having some shortness of breath.  On room air this morning.  Urine output recorded 1100 mL yesterday  Assessment & Plan:   Principal Problem:   Acute respiratory failure with hypoxia (HCC) Active Problems:   Type 2 diabetes mellitus with hyperlipidemia (HCC)   Acute on chronic combined systolic and diastolic CHF (congestive heart failure) (HCC)   Essential hypertension   CKD (chronic kidney disease) stage 4, GFR 15-29 ml/min (HCC)   Coronary artery disease   GERD (gastroesophageal reflux disease)   COPD (chronic obstructive pulmonary disease) (HCC)   HLD (hyperlipidemia)   NSTEMI (non-ST elevated myocardial infarction) (HCC)   S/P CABG x 3   Hypoglycemia   Acute hypoxic respiratory failure -Initially required BiPAP, currently weaned down to room air  Acute on chronic combined systolic and diastolic CHF -EF 25 to 30% -BNP 2342 -Appreciate cardiology following, Lasix on hold due to  elevation in creatinine -Coreg -Strict I's and O's, daily weight  AKI on CKD stage IV -Baseline creatinine 2.7-3 -Worsened in setting of IV Lasix use, continue to monitor off Lasix  NSTEMI -Completed IV heparin for 48 hours -Could be in setting of CHF exacerbation.  No ischemic eval at this time -Plavix, Coreg, Imdur, Lipitor  Hypertension -Imdur, Coreg, hydralazine  Hyperlipidemia -Lipitor  Diabetes mellitus -Semglee, sliding scale insulin  Hypothyroidism -Synthroid  GERD -PPI   DVT prophylaxis:  heparin injection 5,000 Units Start: 01/01/23 2200  Code Status: Full code Family Communication: At bedside Disposition Plan: Home Status is: Inpatient Remains inpatient appropriate because: Worsened creatinine overnight, monitor   Antimicrobials:  Anti-infectives (From admission, onward)    None        Objective: Vitals:   01/03/23 0333 01/03/23 0557 01/03/23 0651 01/03/23 0835  BP: (!) 107/57 (!) 108/53 (!) 108/53 (!) 111/52  Pulse: 72   74  Resp: 18   18  Temp: 99 F (37.2 C)   99.3 F (37.4 C)  TempSrc: Oral   Oral  SpO2: 93%   94%  Weight:      Height:        Intake/Output Summary (Last 24 hours) at 01/03/2023 1125 Last data filed at 01/03/2023 0852 Gross per 24 hour  Intake 687 ml  Output 1100 ml  Net -413 ml   Filed Weights   01/01/23 0505 01/02/23 0520 01/03/23 0311  Weight: 64.4 kg 62.7 kg 60.2 kg    Examination:  General exam: Appears calm and comfortable  Respiratory system: Clear to auscultation. Respiratory effort normal. No respiratory distress. No conversational dyspnea.  On room air Cardiovascular system: S1 & S2 heard, RRR. No murmurs.  Minimal bilateral pedal edema. Gastrointestinal system: Abdomen is nondistended, soft and nontender. Normal bowel sounds heard. Central nervous system: Alert  Extremities: Symmetric in appearance  Skin: No rashes, lesions or ulcers on exposed skin  Psychiatry: Judgement and insight appear  normal. Mood & affect appropriate.   Data Reviewed: I have personally reviewed following labs and imaging studies  CBC: Recent Labs  Lab 12/30/22 0901 12/30/22 0906 12/30/22 1033 12/31/22 0540 01/01/23 0645  WBC  --   --  14.8* 10.1 9.6  HGB 12.9* 12.9*  12.9* 11.5* 11.6* 10.7*  HCT 38.0* 38.0*  38.0* 36.4* 35.6* 32.5*  MCV  --   --  95.5 92.2 91.5  PLT  --   --  215 212 224   Basic Metabolic Panel: Recent Labs  Lab 12/30/22 0855 12/30/22 0901 12/30/22 0906 12/31/22 0540 01/01/23 0645 01/02/23 0241 01/03/23 0232  NA 142   < > 142  142 135 141 140 137  K 3.7   < > 3.8  3.8 3.8 3.6 3.4* 3.6  CL 110   < > 115* 107 109 107 106  CO2 18*  --   --  20* 23 24 21*  GLUCOSE 36*   < > 42* 310* 166* 178* 105*  BUN 38*   < > 43* 48* 46* 47* 56*  CREATININE 3.11*   < > 3.20* 4.03* 3.95* 4.18* 4.51*  CALCIUM 7.8*  --   --  7.6* 8.0* 7.9* 7.8*  MG 2.0  --   --   --   --   --   --   PHOS  --   --   --   --  3.4 3.6 3.1   < > = values in this interval not displayed.   GFR: Estimated Creatinine Clearance: 11.4 mL/min (A) (by C-G formula based on SCr of 4.51 mg/dL (H)). Liver Function Tests: Recent Labs  Lab 12/30/22 0855 12/31/22 0540 01/01/23 0645 01/02/23 0241 01/03/23 0232  AST 26 31  --   --   --   ALT 14 14  --   --   --   ALKPHOS 76 60  --   --   --   BILITOT 0.5 0.5  --   --   --   PROT 4.6* 5.1*  --   --   --   ALBUMIN 1.8* <1.5* <1.5* <1.5* <1.5*   No results for input(s): "LIPASE", "AMYLASE" in the last 168 hours. No results for input(s): "AMMONIA" in the last 168 hours. Coagulation Profile: No results for input(s): "INR", "PROTIME" in the last 168 hours. Cardiac Enzymes: No results for input(s): "CKTOTAL", "CKMB", "CKMBINDEX", "TROPONINI" in the last 168 hours. BNP (last 3 results) No results for input(s): "PROBNP" in the last 8760 hours. HbA1C: No results for input(s): "HGBA1C" in the last 72 hours. CBG: Recent Labs  Lab 01/02/23 0624 01/02/23 1046  01/02/23 1616 01/02/23 2108 01/03/23 0556  GLUCAP 149* 229* 118* 152* 125*   Lipid Profile: No results for input(s): "CHOL", "HDL", "LDLCALC", "TRIG", "CHOLHDL", "LDLDIRECT" in the last 72 hours. Thyroid Function Tests: No results for input(s): "TSH", "T4TOTAL", "FREET4", "T3FREE", "THYROIDAB" in the last 72 hours. Anemia Panel: No results for input(s): "VITAMINB12", "FOLATE", "FERRITIN", "TIBC", "IRON", "RETICCTPCT" in the last 72 hours. Sepsis Labs: No results for input(s): "PROCALCITON", "LATICACIDVEN" in the last 168 hours.  No results found for this or any previous visit (from the past  240 hour(s)).    Radiology Studies: No results found.    Scheduled Meds:  atorvastatin  80 mg Oral Daily   carvedilol  6.25 mg Oral BID WC   clopidogrel  75 mg Oral Daily   guaiFENesin  600 mg Oral BID   heparin injection (subcutaneous)  5,000 Units Subcutaneous Q8H   hydrALAZINE  25 mg Oral Q8H   insulin aspart  0-6 Units Subcutaneous TID WC   insulin glargine-yfgn  10 Units Subcutaneous Daily   isosorbide mononitrate  60 mg Oral Daily   levothyroxine  25 mcg Oral Q0600   pantoprazole  20 mg Oral Daily   sodium chloride flush  3 mL Intravenous Q12H   Continuous Infusions:   LOS: 3 days   Time spent: 25 minutes   Noralee Stain, DO [facility] 01/03/2023, 11:25 AM   Available via Epic secure chat 7am-7pm After these hours, please refer to coverage provider listed on [email or web address]

## 2023-01-03 NOTE — Progress Notes (Addendum)
Progress Note  Patient Name: Colin [name] Date of Encounter: 01/03/2023  Primary Cardiologist: [name], MD   Subjective   Still c/o dyspnea.   Inpatient Medications    Scheduled Meds:  atorvastatin  80 mg Oral Daily   carvedilol  6.25 mg Oral BID WC   clopidogrel  75 mg Oral Daily   guaiFENesin  600 mg Oral BID   heparin injection (subcutaneous)  5,000 Units Subcutaneous Q8H   hydrALAZINE  25 mg Oral Q8H   insulin aspart  0-6 Units Subcutaneous TID WC   insulin glargine-yfgn  10 Units Subcutaneous Daily   isosorbide mononitrate  60 mg Oral Daily   levothyroxine  25 mcg Oral Q0600   pantoprazole  20 mg Oral Daily   sodium chloride flush  3 mL Intravenous Q12H   Continuous Infusions:  PRN Meds: acetaminophen **OR** acetaminophen, ipratropium-albuterol, polyethylene glycol   Vital Signs    Vitals:   01/03/23 0333 01/03/23 0557 01/03/23 0651 01/03/23 0835  BP: (!) 107/57 (!) 108/53 (!) 108/53 (!) 111/52  Pulse: 72   74  Resp: 18   18  Temp: 99 F (37.2 C)   99.3 F (37.4 C)  TempSrc: Oral   Oral  SpO2: 93%   94%  Weight:      Height:        Intake/Output Summary (Last 24 hours) at 01/03/2023 0920 Last data filed at 01/03/2023 4098 Gross per 24 hour  Intake 927 ml  Output 1100 ml  Net -173 ml   Filed Weights   01/01/23 0505 01/02/23 0520 01/03/23 0311  Weight: 64.4 kg 62.7 kg 60.2 kg    Telemetry    Nsr at 74/min - Personally Reviewed  ECG    none - Personally Reviewed  Physical Exam   GEN: No acute distress.   Neck: No JVD Cardiac: RRR, soft systolic murmur, no rubs, soft S3 gallops.  Respiratory: Clear to auscultation bilaterally except for basilar rales GI: Soft, nontender, non-distended  MS: No edema; No deformity. Neuro:  Nonfocal  Psych: Normal affect   Labs    Chemistry Recent Labs  Lab 12/30/22 0855 12/30/22 0901 12/31/22 0540 01/01/23 0645 01/02/23 0241 01/03/23 0232  NA 142   < > 135 141 140 137  K 3.7   <  > 3.8 3.6 3.4* 3.6  CL 110   < > 107 109 107 106  CO2 18*  --  20* 23 24 21*  GLUCOSE 36*   < > 310* 166* 178* 105*  BUN 38*   < > 48* 46* 47* 56*  CREATININE 3.11*   < > 4.03* 3.95* 4.18* 4.51*  CALCIUM 7.8*  --  7.6* 8.0* 7.9* 7.8*  PROT 4.6*  --  5.1*  --   --   --   ALBUMIN 1.8*  --  <1.5* <1.5* <1.5* <1.5*  AST 26  --  31  --   --   --   ALT 14  --  14  --   --   --   ALKPHOS 76  --  60  --   --   --   BILITOT 0.5  --  0.5  --   --   --   GFRNONAA 20*  --  15* 15* 14* 13*  ANIONGAP 14  --  8 9 9 10    < > = values in this interval not displayed.     Hematology Recent Labs  Lab 12/30/22 1033 12/31/22 0540  01/01/23 0645  WBC 14.8* 10.1 9.6  RBC 3.81* 3.86* 3.55*  HGB 11.5* 11.6* 10.7*  HCT 36.4* 35.6* 32.5*  MCV 95.5 92.2 91.5  MCH 30.2 30.1 30.1  MCHC 31.6 32.6 32.9  RDW 13.5 13.5 13.3  PLT 215 212 224    Cardiac EnzymesNo results for input(s): "TROPONINI" in the last 168 hours. No results for input(s): "TROPIPOC" in the last 168 hours.   BNP Recent Labs  Lab 12/30/22 0905  BNP 2,342.4*     DDimer No results for input(s): "DDIMER" in the last 168 hours.   Radiology    No results found.  Cardiac Studies   none  Patient Profile     72 y.o. male admitted with acute on chronic systolic heart failure.   Assessment & Plan    Acute on chronic systolic heart failure - his weight is 132 down from 138. Probably close to baseline.  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 we will need to back off on IV and po diuresis and switch to po demadex tomorrow if creatinine improves.   For questions or updates, please contact [facility] Please consult [email or web address] for contact info under Cardiology/STEMI.      Signed, Lewayne Bunting, MD  01/03/2023, 9:20 AM

## 2023-01-04 ENCOUNTER — Other Ambulatory Visit (HOSPITAL_COMMUNITY): Payer: Self-pay

## 2023-01-04 DIAGNOSIS — J9601 Acute respiratory failure with hypoxia: Secondary | ICD-10-CM | POA: Diagnosis not present

## 2023-01-04 LAB — RENAL FUNCTION PANEL
Albumin: <1.5 g/dL — ABNORMAL LOW (ref 3.5–5.0)
Anion gap: 10 (ref 5–15)
BUN: 64 mg/dL — ABNORMAL HIGH (ref 8–23)
CO2: 22 mmol/L (ref 22–32)
Calcium: 7.7 mg/dL — ABNORMAL LOW (ref 8.9–10.3)
Chloride: 107 mmol/L (ref 98–111)
Creatinine, Ser: 4.93 mg/dL — ABNORMAL HIGH (ref 0.61–1.24)
GFR, Estimated: 12 mL/min — ABNORMAL LOW (ref >60)
Glucose, Bld: 207 mg/dL — ABNORMAL HIGH (ref 70–99)
Phosphorus: 3.7 mg/dL (ref 2.5–4.6)
Potassium: 4 mmol/L (ref 3.5–5.1)
Sodium: 139 mmol/L (ref 135–145)

## 2023-01-04 LAB — GLUCOSE, CAPILLARY
Glucose-Capillary: 174 mg/dL — ABNORMAL HIGH (ref 70–99)
Glucose-Capillary: 212 mg/dL — ABNORMAL HIGH (ref 70–99)

## 2023-01-04 MED ORDER — CLOPIDOGREL BISULFATE 75 MG PO TABS
75.0000 mg | ORAL_TABLET | Freq: Every day | ORAL | 0 refills | Status: DC
  Filled 2023-01-04: qty 30, 30d supply, fill #0

## 2023-01-04 MED ORDER — HYDRALAZINE HCL 25 MG PO TABS
25.0000 mg | ORAL_TABLET | Freq: Three times a day (TID) | ORAL | 0 refills | Status: DC
  Filled 2023-01-04: qty 90, 30d supply, fill #0

## 2023-01-04 MED ORDER — CARVEDILOL 6.25 MG PO TABS
6.2500 mg | ORAL_TABLET | Freq: Two times a day (BID) | ORAL | 0 refills | Status: DC
  Filled 2023-01-04: qty 60, 30d supply, fill #0

## 2023-01-04 NOTE — Progress Notes (Signed)
Progress Note  Patient Name: Colin [name] Date of Encounter: 01/04/2023  Primary Cardiologist: [name], MD   Subjective   Patient seen and examined at his bedside No complaints today.   Inpatient Medications    Scheduled Meds:  atorvastatin  80 mg Oral Daily   carvedilol  6.25 mg Oral BID WC   clopidogrel  75 mg Oral Daily   guaiFENesin  600 mg Oral BID   heparin injection (subcutaneous)  5,000 Units Subcutaneous Q8H   hydrALAZINE  25 mg Oral Q8H   insulin aspart  0-6 Units Subcutaneous TID WC   insulin glargine-yfgn  10 Units Subcutaneous Daily   isosorbide mononitrate  60 mg Oral Daily   levothyroxine  25 mcg Oral Q0600   pantoprazole  20 mg Oral Daily   sodium chloride flush  3 mL Intravenous Q12H   Continuous Infusions:   PRN Meds: acetaminophen **OR** acetaminophen, ipratropium-albuterol, polyethylene glycol   Vital Signs    Vitals:   01/04/23 0130 01/04/23 0500 01/04/23 0610 01/04/23 0813  BP: (!) 102/55  107/69 (!) 96/53  Pulse: 72  75 75  Resp: 16  16 18   Temp: 98.7 F (37.1 C)  98.3 F (36.8 C) 98.3 F (36.8 C)  TempSrc: Oral  Oral Oral  SpO2: 100%  94% 96%  Weight:  59.9 kg    Height:        Intake/Output Summary (Last 24 hours) at 01/04/2023 1033 Last data filed at 01/04/2023 0610 Gross per 24 hour  Intake 483 ml  Output 1050 ml  Net -567 ml   Filed Weights   01/02/23 0520 01/03/23 0311 01/04/23 0500  Weight: 62.7 kg 60.2 kg 59.9 kg    Telemetry    Sinus rhythm - Personally Reviewed  ECG    None  - Personally Reviewed  Physical Exam    General: Comfortable Head: Atraumatic, normal size  Eyes: PEERLA, EOMI  Neck: Supple, normal JVD Cardiac: Normal S1, S2; RRR; no murmurs, rubs, or gallops Lungs: Clear to auscultation bilaterally Abd: Soft, nontender, no hepatomegaly  Ext: warm, no edema Musculoskeletal: No deformities, BUE and BLE strength normal and equal Skin: Warm and dry, no rashes   Neuro: Alert and  oriented to person, place, time, and situation, CNII-XII grossly intact, no focal deficits  Psych: Normal mood and affect   Labs    Chemistry Recent Labs  Lab 12/30/22 0855 12/30/22 0901 12/31/22 0540 01/01/23 0645 01/02/23 0241 01/03/23 0232 01/04/23 0255  NA 142   < > 135   < > 140 137 139  K 3.7   < > 3.8   < > 3.4* 3.6 4.0  CL 110   < > 107   < > 107 106 107  CO2 18*  --  20*   < > 24 21* 22  GLUCOSE 36*   < > 310*   < > 178* 105* 207*  BUN 38*   < > 48*   < > 47* 56* 64*  CREATININE 3.11*   < > 4.03*   < > 4.18* 4.51* 4.93*  CALCIUM 7.8*  --  7.6*   < > 7.9* 7.8* 7.7*  PROT 4.6*  --  5.1*  --   --   --   --   ALBUMIN 1.8*  --  <1.5*   < > <1.5* <1.5* <1.5*  AST 26  --  31  --   --   --   --   ALT 14  --  14  --   --   --   --   ALKPHOS 76  --  60  --   --   --   --   BILITOT 0.5  --  0.5  --   --   --   --   GFRNONAA 20*  --  15*   < > 14* 13* 12*  ANIONGAP 14  --  8   < > 9 10 10    < > = values in this interval not displayed.     Hematology Recent Labs  Lab 12/30/22 1033 12/31/22 0540 01/01/23 0645  WBC 14.8* 10.1 9.6  RBC 3.81* 3.86* 3.55*  HGB 11.5* 11.6* 10.7*  HCT 36.4* 35.6* 32.5*  MCV 95.5 92.2 91.5  MCH 30.2 30.1 30.1  MCHC 31.6 32.6 32.9  RDW 13.5 13.5 13.3  PLT 215 212 224    Cardiac EnzymesNo results for input(s): "TROPONINI" in the last 168 hours. No results for input(s): "TROPIPOC" in the last 168 hours.   BNP Recent Labs  Lab 12/30/22 0905  BNP 2,342.4*     DDimer No results for input(s): "DDIMER" in the last 168 hours.   Radiology    No results found.  Cardiac Studies   Echo   Patient Profile     72 y.o. male severe chronic systolic heart failure with [place] heart class IV symptoms, coronary artery disease history of CABG x 3, chronic kidney disease stage IV,  Assessment & Plan    Acute on chronic systolic heart failure CAD status post CABG x 3 Hypertension Hyperlipidemia Diabetes mellitus type 2 Chronic kidney  disease stage IV  Clinically he tells me that his breathing, blood pressure for me at the bedside - 127/62 mmHG.   As noted previously, his troponin was elevated but flat no chest pain but some shortness of breath which could be in the setting of his heart failure exacerbation.  Continue his atorvastatin and isosorbide. Completed 48hrs heparin therapy. No ischemic eval at this time is indicated.  Blood pressure done by me by the bedside 127/62 mmHg,  Hyperlipidemia - continue with current statin medication.  Diabetes is being managed by the primary team  From a CV standpoint he can be discharged to home on his home diuretics.     For questions or updates, please contact [facility] Please consult [email or web address] for contact info under Cardiology/STEMI.   Signed, Thomasene Ripple, DO  01/04/2023, 10:33 AM

## 2023-01-04 NOTE — TOC Transition Note (Signed)
Transition of Care (TOC) - CM/SW Discharge Note Donn Pierini RN, BSN Transitions of Care Unit 4E- RN Case Manager See Treatment Team for direct phone # 3E holiday coverage  Patient Details  Name: Colin [name] MRN: [id number] Date of Birth: 03-Mar-1950  Transition of Care [facility]) CM/SW Contact:  Darrold Span, RN Phone Number: 01/04/2023, 12:01 PM   Clinical Narrative:    Pt stable for transition home today, No TOC needs noted, family to transport home.    Final next level of care: Home/Self Care Barriers to Discharge: No Barriers Identified   Patient Goals and CMS Choice   Choice offered to / list presented to : NA  Discharge Placement                 Home        Discharge Plan and Services Additional resources added to the After Visit Summary for     Discharge Planning Services: CM Consult Post Acute Care Choice: NA          DME Arranged: N/A DME Agency: NA       HH Arranged: NA HH Agency: NA        Social Determinants of Health (SDOH) Interventions SDOH Screenings   Food Insecurity: No Food Insecurity (12/30/2022)  Housing: Low Risk  (12/30/2022)  Transportation Needs: No Transportation Needs (12/30/2022)  Utilities: Not At Risk (12/30/2022)  Depression (PHQ2-9): High Risk (01/11/2022)  Tobacco Use: Medium Risk (12/30/2022)     Readmission Risk Interventions    01/01/2023   12:57 PM 12/07/2022    4:35 PM  Readmission Risk Prevention Plan  Transportation Screening Complete Complete  PCP or Specialist Appt within 5-7 Days  Complete  PCP or Specialist Appt within 3-5 Days Complete   Home Care Screening  Complete  Medication Review (RN CM)  Complete  HRI or Home Care Consult Complete   Palliative Care Screening Not Applicable   Medication Review (RN Care Manager) Complete

## 2023-01-04 NOTE — Progress Notes (Signed)
Per d/w Dr. Servando Salina, msg sent to our scheduling teams to arrange HF TOC visit in 2 weeks and gen cards f/u 4-6 weeks. Offices are closed today but they will call pt with appt info.

## 2023-01-04 NOTE — Discharge Summary (Signed)
Physician Discharge Summary  Colin [name]:[id number] DOB: 04-Jun-1950 DOA: 12/30/2022  PCP: Grayce Sessions, NP  Admit date: 12/30/2022 Discharge date: 01/04/2023  Admitted From: Home Disposition: Home  Recommendations for Outpatient Follow-up:  Follow up with PCP in 1 week Follow up with cardiology Repeat BMP in 1 week to check and follow creatinine  Discharge Condition: Stable CODE STATUS: Full code Diet recommendation: Heart healthy diet  Brief/Interim Summary: Colin [name] is a 72 yrs old male with PMH significant for hypertension, hyperlipidemia, diabetes, CAD status post CABG, GERD, CKD 4, COPD, bradycardia, hypothyroidism, CHF presented with worsening shortness of breath/respiratory distress. Patient was noted to be hypoxic on arrival in the ED with SpO2 of 70% with increased work of breathing. He was placed on BiPAP.  Family reports patient has been noncompliant with his medications.  He was significantly hypertensive in the ED 190/117.  BNP was significantly elevated 2342, troponin elevated 1392 > 1322.  Chest x-ray showed evidence of CHF with patchy airspace opacity which could represent pneumonia versus pulmonary edema.  Patient has received aspirin, Lasix, nitro infusion, heparin infusion and was admitted.  Cardiology was consulted,  recommended medical management for now.  Patient is respiratory failure resolved and was stable on room air.  Patient was diuresed with IV Lasix.  Discharge Diagnoses:  Principal Problem:   Acute respiratory failure with hypoxia (HCC) Active Problems:   Type 2 diabetes mellitus with hyperlipidemia (HCC)   Acute on chronic combined systolic and diastolic CHF (congestive heart failure) (HCC)   Essential hypertension   CKD (chronic kidney disease) stage 4, GFR 15-29 ml/min (HCC)   Coronary artery disease   GERD (gastroesophageal reflux disease)   COPD (chronic obstructive pulmonary disease) (HCC)   HLD (hyperlipidemia)   NSTEMI (non-ST  elevated myocardial infarction) (HCC)   S/P CABG x 3   Hypoglycemia   Acute hypoxic respiratory failure -Initially required BiPAP, currently weaned down to room air   Acute on chronic combined systolic and diastolic CHF -EF 25 to 30% -BNP 2342 -Appreciate cardiology  -Coreg -Strict I's and O's, daily weight   AKI on CKD stage IV -Baseline creatinine 2.7-3 -Worsened in setting of IV Lasix use -Resume home diuretic, repeat BMP as outpatient   NSTEMI -Completed IV heparin for 48 hours -Could be in setting of CHF exacerbation.  No ischemic eval at this time -Plavix, Coreg, Imdur, Lipitor   Hypertension -Imdur, Coreg, hydralazine   Hyperlipidemia -Lipitor   Diabetes mellitus -Semglee, sliding scale insulin   Hypothyroidism -Synthroid   GERD -PPI  Discharge Instructions  Discharge Instructions     (HEART FAILURE PATIENTS) Call MD:  Anytime you have any of the following symptoms: 1) 3 pound weight gain in 24 hours or 5 pounds in 1 week 2) shortness of breath, with or without a dry hacking cough 3) swelling in the hands, feet or stomach 4) if you have to sleep on extra pillows at night in order to breathe.   Complete by: As directed    Call MD for:  difficulty breathing, headache or visual disturbances   Complete by: As directed    Call MD for:  extreme fatigue   Complete by: As directed    Call MD for:  persistant dizziness or light-headedness   Complete by: As directed    Call MD for:  persistant nausea and vomiting   Complete by: As directed    Call MD for:  severe uncontrolled pain   Complete by: As directed  Call MD for:  temperature >100.4   Complete by: As directed    Diet - low sodium heart healthy   Complete by: As directed    Discharge instructions   Complete by: As directed    You were cared for by a hospitalist during your hospital stay. If you have any questions about your discharge medications or the care you received while you were in the  hospital after you are discharged, you can call the unit and ask to speak with the hospitalist on call if the hospitalist that took care of you is not available. Once you are discharged, your primary care physician will handle any further medical issues. Please note that NO REFILLS for any discharge medications will be authorized once you are discharged, as it is imperative that you return to your primary care physician (or establish a relationship with a primary care physician if you do not have one) for your aftercare needs so that they can reassess your need for medications and monitor your lab values.   Increase activity slowly   Complete by: As directed       Allergies as of 01/04/2023   No Known Allergies      Medication List     TAKE these medications    atorvastatin 80 MG tablet Commonly known as: LIPITOR Take 1 tablet (80 mg total) by mouth daily.   carvedilol 6.25 MG tablet Commonly known as: COREG Take 1 tablet (6.25 mg total) by mouth 2 (two) times daily with a meal. What changed:  medication strength how much to take   clopidogrel 75 MG tablet Commonly known as: Plavix Take 1 tablet (75 mg total) by mouth daily. Start taking on: January 05, 2023   FreeStyle Libre 3 Reader Hardie Pulley Use as directed!   FreeStyle Libre 3 Sensor Misc Use as instructed change every 14 days   furosemide 40 MG tablet Commonly known as: LASIX Take 1 tablet (40 mg total) by mouth daily.   HumaLOG KwikPen 100 UNIT/ML KwikPen Generic drug: insulin lispro Inject 3 Units into the skin 3 (three) times daily with meals.   hydrALAZINE 25 MG tablet Commonly known as: APRESOLINE Take 1 tablet (25 mg total) by mouth every 8 (eight) hours. What changed:  how much to take when to take this   isosorbide mononitrate 60 MG 24 hr tablet Commonly known as: IMDUR Take 1 tablet (60 mg total) by mouth daily.   Lantus SoloStar 100 UNIT/ML Solostar Pen Generic drug: insulin glargine Inject 15  Units into the skin at bedtime.   levothyroxine 25 MCG tablet Commonly known as: SYNTHROID Take 1 tablet (25 mcg total) by mouth daily at 6 (six) AM.   nitroGLYCERIN 0.4 MG SL tablet Commonly known as: NITROSTAT Place 1 tab (0.4mg  total) under the tongue every 5 minutes as needed for chest pain   TechLite Pen Needles 31G X 5 MM Misc Generic drug: Insulin Pen Needle Uses three times daily with insulin.   Insupen Pen Needles 32G X 4 MM Misc Generic drug: Insulin Pen Needle Use as directed with Humalog and Lantus pens.        Follow-up Information     Grayce Sessions, NP Follow up.   Specialty: Internal Medicine Why: GO: 01/15/2023 AT 3:30 PM Contact information: [address] [place] [address] [phone]         Romie Minus, MD Follow up.   Specialty: Cardiology Why: We have sent a message to our cardiology clinics to  arrange a close follow-up appointment with your heart failure team as well as general cardiology team. They will call you with this information. Contact information: [address] [place] [address] [phone]                No Known Allergies  Consultations: Cardiology    Procedures/Studies: DG Chest Portable 1 View  Result Date: 12/30/2022 CLINICAL DATA:  Dyspnea. EXAM: PORTABLE CHEST 1 VIEW COMPARISON:  12/06/2022 FINDINGS: Aortic atherosclerosis. Status post median sternotomy CABG procedure. Stable cardiomediastinal contours. Diffuse increase interstitial markings with prominent peripheral septal markings identified bilaterally. Patchy airspace densities identified within the left upper lobe. Small left pleural effusion. The visualized osseous structures are unremarkable. IMPRESSION: 1. Congestive heart failure. 2. Patchy airspace densities within the left upper lobe compatible with pneumonia versus asymmetric alveolar edema. Electronically Signed   By: Signa Kell M.D.   On: 12/30/2022 09:42   DG Chest Port  1 View  Result Date: 12/06/2022 CLINICAL DATA:  shortness of breath EXAM: PORTABLE CHEST 1 VIEW COMPARISON:  Chest radiograph dated November 23, 2022 FINDINGS: The heart size and mediastinal contours are unchanged. Prior CABG. Aortic atherosclerosis. Bilateral perihilar vascular congestion and basilar predominant interstitial opacities. Small layering left pleural effusion with improved aeration at the left base. Improved small right pleural effusion. No pneumothorax. No acute osseous abnormality. IMPRESSION: 1. Cardiomegaly with pulmonary vascular congestion and mild edema. 2. Small left pleural effusion with improved aeration at the left lung base. Electronically Signed   By: Hart Robinsons M.D.   On: 12/06/2022 17:08      Discharge Exam: Vitals:   01/04/23 0610 01/04/23 0813  BP: 107/69 (!) 96/53  Pulse: 75 75  Resp: 16 18  Temp: 98.3 F (36.8 C) 98.3 F (36.8 C)  SpO2: 94% 96%    General: Pt is alert, awake, not in acute distress Cardiovascular: RRR, S1/S2 +, no edema Respiratory: CTA bilaterally, no wheezing, no rhonchi, no respiratory distress, no conversational dyspnea, on room air  Abdominal: Soft, NT, ND, bowel sounds + Extremities: no edema, no cyanosis Psych: Normal mood and affect, stable judgement and insight     The results of significant diagnostics from this hospitalization (including imaging, microbiology, ancillary and laboratory) are listed below for reference.     Microbiology: No results found for this or any previous visit (from the past 240 hour(s)).   Labs: BNP (last 3 results) Recent Labs    11/23/22 2145 12/06/22 1530 12/30/22 0905  BNP 1,693.7* 1,726.4* 2,342.4*   Basic Metabolic Panel: Recent Labs  Lab 12/30/22 0855 12/30/22 0901 12/31/22 0540 01/01/23 0645 01/02/23 0241 01/03/23 0232 01/04/23 0255  NA 142   < > 135 141 140 137 139  K 3.7   < > 3.8 3.6 3.4* 3.6 4.0  CL 110   < > 107 109 107 106 107  CO2 18*  --  20* 23 24 21* 22   GLUCOSE 36*   < > 310* 166* 178* 105* 207*  BUN 38*   < > 48* 46* 47* 56* 64*  CREATININE 3.11*   < > 4.03* 3.95* 4.18* 4.51* 4.93*  CALCIUM 7.8*  --  7.6* 8.0* 7.9* 7.8* 7.7*  MG 2.0  --   --   --   --   --   --   PHOS  --   --   --  3.4 3.6 3.1 3.7   < > = values in this interval not displayed.   Liver Function  Tests: Recent Labs  Lab 12/30/22 0855 12/31/22 0540 01/01/23 0645 01/02/23 0241 01/03/23 0232 01/04/23 0255  AST 26 31  --   --   --   --   ALT 14 14  --   --   --   --   ALKPHOS 76 60  --   --   --   --   BILITOT 0.5 0.5  --   --   --   --   PROT 4.6* 5.1*  --   --   --   --   ALBUMIN 1.8* <1.5* <1.5* <1.5* <1.5* <1.5*   No results for input(s): "LIPASE", "AMYLASE" in the last 168 hours. No results for input(s): "AMMONIA" in the last 168 hours. CBC: Recent Labs  Lab 12/30/22 0901 12/30/22 0906 12/30/22 1033 12/31/22 0540 01/01/23 0645  WBC  --   --  14.8* 10.1 9.6  HGB 12.9* 12.9*  12.9* 11.5* 11.6* 10.7*  HCT 38.0* 38.0*  38.0* 36.4* 35.6* 32.5*  MCV  --   --  95.5 92.2 91.5  PLT  --   --  215 212 224   Cardiac Enzymes: No results for input(s): "CKTOTAL", "CKMB", "CKMBINDEX", "TROPONINI" in the last 168 hours. BNP: Invalid input(s): "POCBNP" CBG: Recent Labs  Lab 01/03/23 0556 01/03/23 1158 01/03/23 1602 01/03/23 2101 01/04/23 0611  GLUCAP 125* 88 162* 145* 174*   D-Dimer No results for input(s): "DDIMER" in the last 72 hours. Hgb A1c No results for input(s): "HGBA1C" in the last 72 hours. Lipid Profile No results for input(s): "CHOL", "HDL", "LDLCALC", "TRIG", "CHOLHDL", "LDLDIRECT" in the last 72 hours. Thyroid function studies No results for input(s): "TSH", "T4TOTAL", "T3FREE", "THYROIDAB" in the last 72 hours.  Invalid input(s): "FREET3" Anemia work up No results for input(s): "VITAMINB12", "FOLATE", "FERRITIN", "TIBC", "IRON", "RETICCTPCT" in the last 72 hours. Urinalysis    Component Value Date/Time   COLORURINE YELLOW  01/22/2022 2146   APPEARANCEUR CLEAR 01/22/2022 2146   LABSPEC 1.021 01/22/2022 2146   PHURINE 7.0 01/22/2022 2146   GLUCOSEU >=500 (A) 01/22/2022 2146   HGBUR NEGATIVE 01/22/2022 2146   BILIRUBINUR NEGATIVE 01/22/2022 2146   BILIRUBINUR negative 05/06/2020 1113   KETONESUR NEGATIVE 01/22/2022 2146   PROTEINUR >=300 (A) 01/22/2022 2146   UROBILINOGEN 0.2 05/06/2020 1113   UROBILINOGEN 0.2 01/21/2010 1330   NITRITE NEGATIVE 01/22/2022 2146   LEUKOCYTESUR NEGATIVE 01/22/2022 2146   Sepsis Labs Recent Labs  Lab 12/30/22 1033 12/31/22 0540 01/01/23 0645  WBC 14.8* 10.1 9.6   Microbiology No results found for this or any previous visit (from the past 240 hour(s)).   Patient was seen and examined on the day of discharge and was found to be in stable condition. Time coordinating discharge: 25 minutes including assessment and coordination of care, as well as examination of the patient.   SIGNED:  Noralee Stain, DO [facility] 01/04/2023, 11:58 AM

## 2023-01-04 NOTE — Plan of Care (Signed)
  Problem: Education: Goal: Ability to describe self-care measures that may prevent or decrease complications (Diabetes Survival Skills Education) will improve Outcome: Progressing   Problem: Coping: Goal: Ability to adjust to condition or change in health will improve Outcome: Progressing   Problem: Fluid Volume: Goal: Ability to maintain a balanced intake and output will improve Outcome: Progressing   Problem: Health Behavior/Discharge Planning: Goal: Ability to identify and utilize available resources and services will improve Outcome: Progressing   Problem: Nutritional: Goal: Maintenance of adequate nutrition will improve Outcome: Progressing   Problem: Skin Integrity: Goal: Risk for impaired skin integrity will decrease Outcome: Progressing   

## 2023-01-07 ENCOUNTER — Telehealth: Payer: Self-pay

## 2023-01-07 NOTE — Transitions of Care (Post Inpatient/ED Visit) (Signed)
   01/07/2023  Name: Colin [name] MRN: [id number] DOB: 1950/05/16  Today's TOC FU Call Status: Today's TOC FU Call Status:: Unsuccessful Call (1st Attempt) Unsuccessful Call (1st Attempt) Date: 01/07/23  Attempted to reach the patient regarding the most recent Inpatient/ED visit.  Follow Up Plan: Additional outreach attempts will be made to reach the patient to complete the Transitions of Care (Post Inpatient/ED visit) call.   Signature  Robyne Peers, RN

## 2023-01-08 ENCOUNTER — Telehealth: Payer: Self-pay

## 2023-01-08 NOTE — Transitions of Care (Post Inpatient/ED Visit) (Signed)
   01/08/2023  Name: NOLYN GETTMAN MRN: [id number] DOB: [birth year]  Today's TOC FU Call Status: Today's TOC FU Call Status:: Unsuccessful Call (2nd Attempt) Unsuccessful Call (1st Attempt) Date: 01/07/23 Unsuccessful Call (2nd Attempt) Date: 01/08/23  Attempted to reach the patient regarding the most recent Inpatient/ED visit.  Follow Up Plan: Additional outreach attempts will be made to reach the patient to complete the Transitions of Care (Post Inpatient/ED visit) call.   Signature  Robyne Peers, RN

## 2023-01-09 ENCOUNTER — Other Ambulatory Visit: Payer: Self-pay

## 2023-01-09 ENCOUNTER — Telehealth: Payer: Self-pay

## 2023-01-09 NOTE — Transitions of Care (Post Inpatient/ED Visit) (Signed)
   01/09/2023  Name: Colin [name] MRN: [id number] DOB: 1950/05/17  Today's TOC FU Call Status: Today's TOC FU Call Status:: Unsuccessful Call (3rd Attempt) Unsuccessful Call (1st Attempt) Date: 01/07/23 Unsuccessful Call (2nd Attempt) Date: 01/08/23 Unsuccessful Call (3rd Attempt) Date: 01/09/23  Attempted to reach the patient regarding the most recent Inpatient/ED visit.  Follow Up Plan: No further outreach attempts will be made at this time. We have been unable to contact the patient.  The interpreter left a message reminding the patient of his appointment at [facility] on 01/15/2023 @ 1530  Signature  Robyne Peers, RN

## 2023-01-15 ENCOUNTER — Inpatient Hospital Stay (INDEPENDENT_AMBULATORY_CARE_PROVIDER_SITE_OTHER): Payer: 59 | Admitting: Primary Care

## 2023-01-17 ENCOUNTER — Encounter (HOSPITAL_COMMUNITY): Payer: 59

## 2023-01-31 ENCOUNTER — Ambulatory Visit: Payer: 59 | Admitting: "Endocrinology

## 2023-02-04 ENCOUNTER — Encounter: Payer: Self-pay | Admitting: Cardiology

## 2023-02-04 ENCOUNTER — Telehealth: Payer: Self-pay | Admitting: Cardiology

## 2023-02-04 NOTE — Telephone Encounter (Signed)
Patient contacted 3x with no success in scheduling appointment. Will send letter.

## 2023-02-04 NOTE — Telephone Encounter (Signed)
-----   Message from Nurse Corky Crafts sent at 01/07/2023  8:36 AM EST ----- Regarding: FW: Needs f/u Scheduling team, please make 4-6 week appt with APP per Ronie Spies PA-C, and let triage know what day you schedule?  Thanks, Lajoyce Corners ----- Message ----- From: Laurann Montana, PA-C Sent: 01/04/2023  11:14 AM EST To: [facility] Triage Subject: Needs f/u                                      Hi CHF team, Dr. Servando Salina wants this pt to f/u with general cards team (Dr. Rosemary Holms or APP) in 4-6 weeks. (I sent a separate msg to the CHF clinic to get him in there in 2 weeks just in case you see them reaching out to the patient as well.) Please call him with appt info. Thank you!  Kriste Basque, PA with [facility]

## 2023-02-12 ENCOUNTER — Other Ambulatory Visit: Payer: Self-pay

## 2023-02-12 ENCOUNTER — Encounter: Payer: Self-pay | Admitting: Cardiology

## 2023-02-12 NOTE — Telephone Encounter (Signed)
 error

## 2023-02-25 ENCOUNTER — Encounter (HOSPITAL_COMMUNITY): Payer: Self-pay

## 2023-02-25 ENCOUNTER — Inpatient Hospital Stay (HOSPITAL_COMMUNITY)
Admission: EM | Admit: 2023-02-25 | Discharge: 2023-03-02 | DRG: 291 | Disposition: A | Discharge status: Home Health | Payer: 59 | Attending: Internal Medicine | Admitting: Internal Medicine

## 2023-02-25 ENCOUNTER — Other Ambulatory Visit: Payer: Self-pay

## 2023-02-25 ENCOUNTER — Inpatient Hospital Stay (HOSPITAL_COMMUNITY): Payer: 59

## 2023-02-25 ENCOUNTER — Emergency Department (HOSPITAL_COMMUNITY): Payer: 59

## 2023-02-25 DIAGNOSIS — E876 Hypokalemia: Secondary | ICD-10-CM | POA: Diagnosis present

## 2023-02-25 DIAGNOSIS — Z91148 Patient's other noncompliance with medication regimen for other reason: Secondary | ICD-10-CM

## 2023-02-25 DIAGNOSIS — N179 Acute kidney failure, unspecified: Secondary | ICD-10-CM | POA: Diagnosis not present

## 2023-02-25 DIAGNOSIS — G4700 Insomnia, unspecified: Secondary | ICD-10-CM | POA: Diagnosis not present

## 2023-02-25 DIAGNOSIS — J90 Pleural effusion, not elsewhere classified: Secondary | ICD-10-CM

## 2023-02-25 DIAGNOSIS — I130 Hypertensive heart and chronic kidney disease with heart failure and stage 1 through stage 4 chronic kidney disease, or unspecified chronic kidney disease: Principal | ICD-10-CM | POA: Diagnosis present

## 2023-02-25 DIAGNOSIS — E1122 Type 2 diabetes mellitus with diabetic chronic kidney disease: Secondary | ICD-10-CM | POA: Diagnosis not present

## 2023-02-25 DIAGNOSIS — Z951 Presence of aortocoronary bypass graft: Secondary | ICD-10-CM

## 2023-02-25 DIAGNOSIS — N281 Cyst of kidney, acquired: Secondary | ICD-10-CM | POA: Diagnosis not present

## 2023-02-25 DIAGNOSIS — E1165 Type 2 diabetes mellitus with hyperglycemia: Secondary | ICD-10-CM | POA: Diagnosis present

## 2023-02-25 DIAGNOSIS — Z79890 Hormone replacement therapy (postmenopausal): Secondary | ICD-10-CM | POA: Diagnosis not present

## 2023-02-25 DIAGNOSIS — D649 Anemia, unspecified: Secondary | ICD-10-CM | POA: Diagnosis not present

## 2023-02-25 DIAGNOSIS — E039 Hypothyroidism, unspecified: Secondary | ICD-10-CM

## 2023-02-25 DIAGNOSIS — R918 Other nonspecific abnormal finding of lung field: Secondary | ICD-10-CM | POA: Diagnosis not present

## 2023-02-25 DIAGNOSIS — E11649 Type 2 diabetes mellitus with hypoglycemia without coma: Secondary | ICD-10-CM | POA: Diagnosis not present

## 2023-02-25 DIAGNOSIS — J449 Chronic obstructive pulmonary disease, unspecified: Secondary | ICD-10-CM | POA: Diagnosis not present

## 2023-02-25 DIAGNOSIS — J9811 Atelectasis: Secondary | ICD-10-CM | POA: Diagnosis present

## 2023-02-25 DIAGNOSIS — I509 Heart failure, unspecified: Principal | ICD-10-CM

## 2023-02-25 DIAGNOSIS — E8809 Other disorders of plasma-protein metabolism, not elsewhere classified: Secondary | ICD-10-CM | POA: Diagnosis not present

## 2023-02-25 DIAGNOSIS — I252 Old myocardial infarction: Secondary | ICD-10-CM | POA: Diagnosis not present

## 2023-02-25 DIAGNOSIS — Z79899 Other long term (current) drug therapy: Secondary | ICD-10-CM

## 2023-02-25 DIAGNOSIS — Z794 Long term (current) use of insulin: Secondary | ICD-10-CM | POA: Diagnosis not present

## 2023-02-25 DIAGNOSIS — Z91128 Patient's intentional underdosing of medication regimen for other reason: Secondary | ICD-10-CM

## 2023-02-25 DIAGNOSIS — K820 Obstruction of gallbladder: Secondary | ICD-10-CM | POA: Diagnosis not present

## 2023-02-25 DIAGNOSIS — I110 Hypertensive heart disease with heart failure: Secondary | ICD-10-CM | POA: Diagnosis not present

## 2023-02-25 DIAGNOSIS — I5023 Acute on chronic systolic (congestive) heart failure: Secondary | ICD-10-CM | POA: Diagnosis not present

## 2023-02-25 DIAGNOSIS — I255 Ischemic cardiomyopathy: Secondary | ICD-10-CM | POA: Diagnosis not present

## 2023-02-25 DIAGNOSIS — Z87891 Personal history of nicotine dependence: Secondary | ICD-10-CM

## 2023-02-25 DIAGNOSIS — I2510 Atherosclerotic heart disease of native coronary artery without angina pectoris: Secondary | ICD-10-CM | POA: Diagnosis present

## 2023-02-25 DIAGNOSIS — R809 Proteinuria, unspecified: Secondary | ICD-10-CM | POA: Diagnosis not present

## 2023-02-25 DIAGNOSIS — N189 Chronic kidney disease, unspecified: Secondary | ICD-10-CM | POA: Diagnosis not present

## 2023-02-25 DIAGNOSIS — N184 Chronic kidney disease, stage 4 (severe): Secondary | ICD-10-CM | POA: Diagnosis not present

## 2023-02-25 DIAGNOSIS — Z48813 Encounter for surgical aftercare following surgery on the respiratory system: Secondary | ICD-10-CM | POA: Diagnosis not present

## 2023-02-25 DIAGNOSIS — E1169 Type 2 diabetes mellitus with other specified complication: Secondary | ICD-10-CM | POA: Diagnosis not present

## 2023-02-25 DIAGNOSIS — R0602 Shortness of breath: Secondary | ICD-10-CM | POA: Diagnosis not present

## 2023-02-25 DIAGNOSIS — I5043 Acute on chronic combined systolic (congestive) and diastolic (congestive) heart failure: Secondary | ICD-10-CM | POA: Diagnosis not present

## 2023-02-25 DIAGNOSIS — E785 Hyperlipidemia, unspecified: Secondary | ICD-10-CM | POA: Diagnosis present

## 2023-02-25 DIAGNOSIS — I10 Essential (primary) hypertension: Secondary | ICD-10-CM | POA: Diagnosis present

## 2023-02-25 DIAGNOSIS — J918 Pleural effusion in other conditions classified elsewhere: Secondary | ICD-10-CM | POA: Diagnosis not present

## 2023-02-25 DIAGNOSIS — E038 Other specified hypothyroidism: Secondary | ICD-10-CM | POA: Diagnosis present

## 2023-02-25 DIAGNOSIS — K828 Other specified diseases of gallbladder: Secondary | ICD-10-CM | POA: Diagnosis not present

## 2023-02-25 DIAGNOSIS — J811 Chronic pulmonary edema: Secondary | ICD-10-CM | POA: Diagnosis not present

## 2023-02-25 DIAGNOSIS — I517 Cardiomegaly: Secondary | ICD-10-CM | POA: Diagnosis not present

## 2023-02-25 DIAGNOSIS — I080 Rheumatic disorders of both mitral and aortic valves: Secondary | ICD-10-CM | POA: Diagnosis not present

## 2023-02-25 DIAGNOSIS — I5040 Unspecified combined systolic (congestive) and diastolic (congestive) heart failure: Secondary | ICD-10-CM | POA: Diagnosis not present

## 2023-02-25 DIAGNOSIS — K769 Liver disease, unspecified: Secondary | ICD-10-CM | POA: Diagnosis not present

## 2023-02-25 DIAGNOSIS — Z7902 Long term (current) use of antithrombotics/antiplatelets: Secondary | ICD-10-CM

## 2023-02-25 HISTORY — DX: Chronic systolic (congestive) heart failure: I50.22

## 2023-02-25 HISTORY — DX: Ischemic cardiomyopathy: I25.5

## 2023-02-25 LAB — BASIC METABOLIC PANEL
Anion gap: 8 (ref 5–15)
BUN: 18 mg/dL (ref 8–23)
CO2: 25 mmol/L (ref 22–32)
Calcium: 8.3 mg/dL — ABNORMAL LOW (ref 8.9–10.3)
Chloride: 105 mmol/L (ref 98–111)
Creatinine, Ser: 2.76 mg/dL — ABNORMAL HIGH (ref 0.61–1.24)
GFR, Estimated: 24 mL/min — ABNORMAL LOW (ref >60)
Glucose, Bld: 311 mg/dL — ABNORMAL HIGH (ref 70–99)
Potassium: 3.2 mmol/L — ABNORMAL LOW (ref 3.5–5.1)
Sodium: 138 mmol/L (ref 135–145)

## 2023-02-25 LAB — HEPATIC FUNCTION PANEL
ALT: 11 U/L (ref 0–44)
AST: 15 U/L (ref 15–41)
Albumin: <1.5 g/dL — ABNORMAL LOW (ref 3.5–5.0)
Alkaline Phosphatase: 71 U/L (ref 38–126)
Bilirubin, Direct: <0.1 mg/dL (ref 0.0–0.2)
Total Bilirubin: 0.4 mg/dL (ref 0.0–1.2)
Total Protein: 5.2 g/dL — ABNORMAL LOW (ref 6.5–8.1)

## 2023-02-25 LAB — CBC
HCT: 40.2 % (ref 39.0–52.0)
Hemoglobin: 13 g/dL (ref 13.0–17.0)
MCH: 29.9 pg (ref 26.0–34.0)
MCHC: 32.3 g/dL (ref 30.0–36.0)
MCV: 92.4 fL (ref 80.0–100.0)
Platelets: 282 10*3/uL (ref 150–400)
RBC: 4.35 MIL/uL (ref 4.22–5.81)
RDW: 14.9 % (ref 11.5–15.5)
WBC: 6.8 10*3/uL (ref 4.0–10.5)
nRBC: 0 % (ref 0.0–0.2)

## 2023-02-25 LAB — HEMOGLOBIN A1C
Hgb A1c MFr Bld: 11.6 % — ABNORMAL HIGH (ref 4.8–5.6)
Mean Plasma Glucose: 286.22 mg/dL

## 2023-02-25 LAB — BRAIN NATRIURETIC PEPTIDE: B Natriuretic Peptide: 3916.7 pg/mL — ABNORMAL HIGH (ref 0.0–100.0)

## 2023-02-25 LAB — TROPONIN I (HIGH SENSITIVITY)
Troponin I (High Sensitivity): 14 ng/L (ref <18)
Troponin I (High Sensitivity): 15 ng/L (ref <18)

## 2023-02-25 LAB — CBG MONITORING, ED
Glucose-Capillary: 293 mg/dL — ABNORMAL HIGH (ref 70–99)
Glucose-Capillary: 363 mg/dL — ABNORMAL HIGH (ref 70–99)

## 2023-02-25 MED ORDER — LEVOTHYROXINE SODIUM 25 MCG PO TABS: 25.0000 ug | ORAL_TABLET | Freq: Every day | ORAL | Status: DC

## 2023-02-25 MED ORDER — LEVOTHYROXINE SODIUM 25 MCG PO TABS
25.0000 ug | ORAL_TABLET | Freq: Every day | ORAL | Status: DC
Start: 2023-02-26 — End: 2023-03-02
  Administered 2023-02-26 – 2023-03-02 (×5): 25 ug via ORAL
  Filled 2023-02-25 (×5): qty 1

## 2023-02-25 MED ORDER — CARVEDILOL 3.125 MG PO TABS
6.2500 mg | ORAL_TABLET | Freq: Once | ORAL | Status: AC
  Administered 2023-02-25: 6.25 mg via ORAL
  Filled 2023-02-25: qty 2

## 2023-02-25 MED ORDER — INSULIN GLARGINE-YFGN 100 UNIT/ML ~~LOC~~ SOLN
15.0000 [IU] | Freq: Every day | SUBCUTANEOUS | Status: DC
Start: 2023-02-25 — End: 2023-02-27
  Administered 2023-02-25 – 2023-02-26 (×2): 15 [IU] via SUBCUTANEOUS
  Filled 2023-02-25 (×3): qty 0.15

## 2023-02-25 MED ORDER — ISOSORB DINITRATE-HYDRALAZINE 20-37.5 MG PO TABS
1.0000 | ORAL_TABLET | Freq: Three times a day (TID) | ORAL | Status: DC
  Administered 2023-02-25 – 2023-03-02 (×14): 1 via ORAL
  Filled 2023-02-25 (×14): qty 1

## 2023-02-25 MED ORDER — CLOPIDOGREL BISULFATE 75 MG PO TABS
75.0000 mg | ORAL_TABLET | Freq: Every day | ORAL | Status: DC
  Administered 2023-02-25 – 2023-03-02 (×6): 75 mg via ORAL
  Filled 2023-02-25 (×6): qty 1

## 2023-02-25 MED ORDER — ENOXAPARIN SODIUM 30 MG/0.3ML IJ SOSY
30.0000 mg | PREFILLED_SYRINGE | INTRAMUSCULAR | Status: DC
  Administered 2023-02-25 – 2023-03-01 (×5): 30 mg via SUBCUTANEOUS
  Filled 2023-02-25 (×5): qty 0.3

## 2023-02-25 MED ORDER — POTASSIUM CHLORIDE CRYS ER 20 MEQ PO TBCR
40.0000 meq | EXTENDED_RELEASE_TABLET | Freq: Once | ORAL | Status: AC
  Administered 2023-02-25: 40 meq via ORAL
  Filled 2023-02-25: qty 2

## 2023-02-25 MED ORDER — HYDRALAZINE HCL 25 MG PO TABS: 25.0000 mg | ORAL_TABLET | Freq: Three times a day (TID) | ORAL | Status: DC

## 2023-02-25 MED ORDER — INSULIN ASPART 100 UNIT/ML IJ SOLN
0.0000 [IU] | Freq: Three times a day (TID) | INTRAMUSCULAR | Status: DC
  Administered 2023-02-25: 9 [IU] via SUBCUTANEOUS
  Administered 2023-02-26: 3 [IU] via SUBCUTANEOUS

## 2023-02-25 MED ORDER — FUROSEMIDE 10 MG/ML IJ SOLN
40.0000 mg | Freq: Once | INTRAMUSCULAR | Status: AC
  Administered 2023-02-25: 40 mg via INTRAVENOUS
  Filled 2023-02-25: qty 4

## 2023-02-25 MED ORDER — FUROSEMIDE 10 MG/ML IJ SOLN: 60.0000 mg | Freq: Two times a day (BID) | INTRAMUSCULAR | Status: DC

## 2023-02-25 MED ORDER — FUROSEMIDE 10 MG/ML IJ SOLN
80.0000 mg | Freq: Two times a day (BID) | INTRAMUSCULAR | Status: DC
  Administered 2023-02-25 – 2023-02-28 (×6): 80 mg via INTRAVENOUS
  Filled 2023-02-25 (×6): qty 8

## 2023-02-25 MED ORDER — FUROSEMIDE 10 MG/ML IJ SOLN: 40.0000 mg | Freq: Two times a day (BID) | INTRAMUSCULAR | Status: DC

## 2023-02-25 MED ORDER — ISOSORBIDE MONONITRATE ER 30 MG PO TB24: 60.0000 mg | ORAL_TABLET | Freq: Every day | ORAL | Status: DC

## 2023-02-25 MED ORDER — ACETAMINOPHEN 325 MG PO TABS
650.0000 mg | ORAL_TABLET | ORAL | Status: DC | PRN
  Administered 2023-02-26: 650 mg via ORAL
  Filled 2023-02-25: qty 2

## 2023-02-25 MED ORDER — SODIUM CHLORIDE 0.9 % IV SOLN
250.0000 mL | INTRAVENOUS | Status: AC | PRN
Start: 2023-02-25 — End: 2023-02-26

## 2023-02-25 MED ORDER — SODIUM CHLORIDE 0.9% FLUSH
3.0000 mL | Freq: Two times a day (BID) | INTRAVENOUS | Status: DC
  Administered 2023-02-25 – 2023-03-01 (×9): 3 mL via INTRAVENOUS

## 2023-02-25 MED ORDER — SODIUM CHLORIDE 0.9% FLUSH
3.0000 mL | INTRAVENOUS | Status: DC | PRN
  Administered 2023-02-26: 3 mL via INTRAVENOUS

## 2023-02-25 MED ORDER — ATORVASTATIN CALCIUM 80 MG PO TABS
80.0000 mg | ORAL_TABLET | Freq: Every day | ORAL | Status: DC
Start: 2023-02-26 — End: 2023-03-02
  Administered 2023-02-26 – 2023-03-02 (×5): 80 mg via ORAL
  Filled 2023-02-25 (×2): qty 1
  Filled 2023-02-25: qty 2
  Filled 2023-02-25 (×2): qty 1

## 2023-02-25 NOTE — Consult Note (Signed)
Cardiology Consultation   Patient ID: Colin [name] MRN: [id number]; DOB: Jun 30, 1950  Admit date: 02/25/2023 Date of Consult: 02/25/2023  PCP:  Grayce Sessions, NP   [facility] Providers Cardiologist:  [name], MD  Advanced Heart Failure:  Romie Minus, MD  {  Patient Profile:   Colin [name] is a 73 y.o. male with a hx of HFrEF, CAD s/p CABG x 3, HTN, HLD, T2DM, CKD stage 4, COPD who is being seen 02/25/2023 for the evaluation of acute on chronic systolic heart failure at the request of Dr. Austin Miles.  History of Present Illness:   Colin [name] is a 73yo male with a past medical history of HFrEF, CAD s/p CABG x 3, HTN, HLD, T2DM, CKD stage 4, COPD. He presented to [facility] ED on 02/25/2023 with chief complaint of shortness of breath. Patient stated that he had increasing shortness of breath over the last couple days, has been too weak to stand and walk and feels more short of breath when standing. Patient admits to running out of his medications ~ 2 weeks.   His current home medications included: Coreg 6.25 mg BID, Lasix 40 mg daily, Lipitor 80 mg daily, Plavix 75 mg daily, hydralazine 25 mg Q8, Imur 60 mg daily with PRN SL nitroglycerin.  Patient was previously admitted to the hospital 11/24-11/29/2024 for an almost identical situation where he was noncompliant with his medications and then became short of breath.  His most recent echocardiogram from 11/14/2022 showed LVEF 25-30%, global hypokinesis, grade I diastolic dysfunction, normal RV size and function, moderately dilated left atrium, mild to moderate MR, normal IVC.  Patient had RHC/LHC done in 08/2020 that showed 80% stenosis of pro-mid LAD, 95% ISR Lcx/OM1, 80% ISR of prox RCA and patient was referred to have CABG (LIMA-LAD, SVG-PDA, SVG-OM1 08/23/2020).   Spoke with patient and he agrees with history as stated above. Denies any chest pain. States he has only had swelling and shortness of breath. He  admits that he has been out of all of his medications for the last 2 weeks.   Past Medical History:  Diagnosis Date   Acute hypoxic respiratory failure (HCC) 12/16/2021   Acute pulmonary edema (HCC)    Arrhythmia    CAD (coronary artery disease)    a. s/p stent x 2; b. 08/2020 CABG x 3 (LIMA->LAD, VG->PDA, VG->OM1).   Carotid artery occlusion    Chronic combined systolic and diastolic heart failure (HCC)    Chronic HFrEF (heart failure with reduced ejection fraction) (HCC)    a. 11/2022 Echo: EF 25-30%, glob HK, GrI DD, nl RV fxn, mod dil LA, mild-mod MR.   Diabetes mellitus    Dyslipidemia    Essential hypertension    Ischemic cardiomyopathy    a. 11/2022 Echo: EF 25-30%.   Left upper quadrant abdominal pain 05/30/2022   NSTEMI (non-ST elevated myocardial infarction) [facility])    Past Surgical History:  Procedure Laterality Date   CARDIAC CATHETERIZATION     01/2010 ; 04/2010   CORONARY ARTERY BYPASS GRAFT N/A 08/23/2020   Procedure: CORONARY ARTERY BYPASS GRAFTING (CABG) x 3 ON CARDIOPULMONARY BYPASS USING EVH AND PARTIALLY OPEN HARVESTED LEFT GREATER SAPHENOUS VEIN AND LEFT INTERNAL MAMMARY ARTERY.  LIMA TO LAD, SVG TO OM, SVG TO PDA;  Surgeon: Corliss Skains, MD;  Location: [facility] OR;  Service: Open Heart Surgery;  Laterality: N/A;   RIGHT HEART CATH N/A 12/19/2021   Procedure: RIGHT HEART CATH;  Surgeon: [name], MD;  Location: [facility] INVASIVE CV LAB;  Service: Cardiovascular;  Laterality: N/A;   RIGHT/LEFT HEART CATH AND CORONARY ANGIOGRAPHY N/A 08/22/2020   Procedure: RIGHT/LEFT HEART CATH AND CORONARY ANGIOGRAPHY;  Surgeon: [name], MD;  Location: [facility] INVASIVE CV LAB;  Service: Cardiovascular;  Laterality: N/A;   TEE WITHOUT CARDIOVERSION N/A 08/23/2020   Procedure: TRANSESOPHAGEAL ECHOCARDIOGRAM (TEE);  Surgeon: Corliss Skains, MD;  Location: [facility] OR;  Service: Open Heart Surgery;  Laterality: N/A;   TEMPORARY PACEMAKER N/A 08/22/2020   Procedure: TEMPORARY  PACEMAKER;  Surgeon: [name], MD;  Location: [facility] INVASIVE CV LAB;  Service: Cardiovascular;  Laterality: N/A;   VEIN HARVEST Left 08/23/2020   Procedure: ENDOVEIN HARVEST AND PARTIALLY OPEN GREATER SAPHENOUS VEIN HARVESTING;  Surgeon: Corliss Skains, MD;  Location: [facility] OR;  Service: Open Heart Surgery;  Laterality: Left;    Home Medications:  Prior to Admission medications   Medication Sig Start Date End Date Taking? Authorizing Provider  atorvastatin (LIPITOR) 80 MG tablet Take 1 tablet (80 mg total) by mouth daily. 11/28/22   Zannie Cove, MD  carvedilol (COREG) 6.25 MG tablet Take 1 tablet (6.25 mg total) by mouth 2 (two) times daily with a meal. 01/04/23 02/25/23  Noralee Stain, DO  clopidogrel (PLAVIX) 75 MG tablet Take 1 tablet (75 mg total) by mouth daily. 01/05/23   Noralee Stain, DO  Continuous Glucose Receiver (FREESTYLE LIBRE 3 READER) DEVI Use as directed! 02/06/22   Reather Littler, MD  Continuous Glucose Sensor (FREESTYLE LIBRE 3 SENSOR) MISC Use as instructed change every 14 days 08/01/22   Altamese Garrard, MD  furosemide (LASIX) 40 MG tablet Take 1 tablet (40 mg total) by mouth daily. 11/29/22   Zannie Cove, MD  hydrALAZINE (APRESOLINE) 25 MG tablet Take 1 tablet (25 mg total) by mouth every 8 (eight) hours. 01/04/23 02/25/23  Noralee Stain, DO  insulin glargine (LANTUS) 100 UNIT/ML Solostar Pen Inject 15 Units into the skin at bedtime. 12/08/22   Arrien, York Ram, MD  insulin lispro (ADMELOG SOLOSTAR) 100 UNIT/ML KwikPen Inject 3 Units into the skin 3 (three) times daily with meals. 12/08/22   Arrien, York Ram, MD  Insulin Pen Needle (PEN NEEDLES 3/16") 31G X 5 MM MISC Uses three times daily with insulin. 05/24/22   Shamleffer, Konrad Dolores, MD  Insulin Pen Needle 32G X 4 MM MISC Use as directed with Humalog and Lantus pens. 12/08/22   Arrien, York Ram, MD  isosorbide mononitrate (IMDUR) 60 MG 24 hr tablet Take 1 tablet (60 mg total) by mouth  daily. 11/28/22 11/23/23  Zannie Cove, MD  levothyroxine (SYNTHROID) 25 MCG tablet Take 1 tablet (25 mcg total) by mouth daily at 6 (six) AM. 12/08/22 02/25/23  Arrien, York Ram, MD  nitroGLYCERIN (NITROSTAT) 0.4 MG SL tablet Place 1 tab (0.4mg  total) under the tongue every 5 minutes as needed for chest pain 02/14/22   [name], MD   Inpatient Medications: Scheduled Meds:  atorvastatin  80 mg Oral Daily   clopidogrel  75 mg Oral Daily   enoxaparin (LOVENOX) injection  30 mg Subcutaneous Q24H   furosemide  60 mg Intravenous BID   hydrALAZINE  25 mg Oral Q8H   insulin aspart  0-9 Units Subcutaneous TID WC   insulin glargine-yfgn  15 Units Subcutaneous QHS   isosorbide mononitrate  60 mg Oral Daily   [START ON 02/26/2023] levothyroxine  25 mcg Oral Q0600   potassium chloride  40 mEq Oral  Once   sodium chloride flush  3 mL Intravenous Q12H   Continuous Infusions:  sodium chloride     PRN Meds: sodium chloride, acetaminophen, sodium chloride flush  Allergies:   No Known Allergies  Social History:   Social History   Socioeconomic History   Marital status: Married    Spouse name: Not on file   Number of children: Not on file   Years of education: Not on file   Highest education level: Not on file  Occupational History   Not on file  Tobacco Use   Smoking status: Former    Current packs/day: 0.00    Average packs/day: 0.3 packs/day for 50.0 years (12.5 ttl pk-yrs)    Types: Cigarettes    Start date: 08/1970    Quit date: 08/2020    Years since quitting: 2.5   Smokeless tobacco: Never  Vaping Use   Vaping status: Never Used  Substance and Sexual Activity   Alcohol use: No   Drug use: No   Sexual activity: Not on file  Other Topics Concern   Not on file  Social History Narrative   Not on file   Social Drivers of Health   Financial Resource Strain: Not on file  Food Insecurity: No Food Insecurity (12/30/2022)   Hunger Vital Sign    Worried About  Running Out of Food in the Last Year: Never true    Ran Out of Food in the Last Year: Never true  Transportation Needs: No Transportation Needs (12/30/2022)   PRAPARE - Administrator, Civil Service (Medical): No    Lack of Transportation (Non-Medical): No  Physical Activity: Not on file  Stress: Not on file  Social Connections: Not on file  Intimate Partner Violence: Not At Risk (12/30/2022)   Humiliation, Afraid, Rape, and Kick questionnaire    Fear of Current or Ex-Partner: No    Emotionally Abused: No    Physically Abused: No    Sexually Abused: No    Family History:   Family History  Problem Relation Age of Onset   Diabetes Neg Hx     ROS:  Please see the history of present illness.  All other ROS reviewed and negative.     Physical Exam/Data:   Vitals:   02/25/23 1327 02/25/23 1426 02/25/23 1600 02/25/23 1700  BP: (!) 192/88 (!) 172/78 (!) 175/87 (!) 151/67  Pulse: 79 84 78 65  Resp: (!) 24  (!) 22 (!) 25  Temp: (!) 97.4 F (36.3 C)     TempSrc: Oral     SpO2: 96%  98% 96%  Weight:      Height:        Intake/Output Summary (Last 24 hours) at 02/25/2023 1735 Last data filed at 02/25/2023 1720 Gross per 24 hour  Intake --  Output 850 ml  Net -850 ml      02/25/2023    1:22 PM 01/04/2023    5:00 AM 01/03/2023    3:11 AM  Last 3 Weights  Weight (lbs) 132 lb 4.4 oz 132 lb 132 lb 11.5 oz  Weight (kg) 60 kg 59.875 kg 60.2 kg     Body mass index is 24.19 kg/m.  General:  In no acute distress, on RA HEENT: normal Neck: + JVD Vascular: No carotid bruits; Distal pulses 2+ bilaterally Cardiac:  normal S1, S2; RRR; no murmur  Lungs:  bibasilar decreased breath sounds  Abd: soft, nontender Ext: no edema Musculoskeletal:  No deformitie Skin: warm  and dry  Neuro:  No focal abnormalities noted Psych:  Normal affect   EKG:  The EKG was personally reviewed and demonstrates:  sinus rhythm with signs of anteroseptal infarct Telemetry:  Telemetry was  personally reviewed and demonstrates:  sinus rhythm HR 60-70s   Relevant CV Studies: Updated echocardiogram ordered, pending results   Laboratory Data:  High Sensitivity Troponin:   Recent Labs  Lab 02/25/23 1325 02/25/23 1555  TROPONINIHS 14 15     Chemistry Recent Labs  Lab 02/25/23 1325  NA 138  K 3.2*  CL 105  CO2 25  GLUCOSE 311*  BUN 18  CREATININE 2.76*  CALCIUM 8.3*  GFRNONAA 24*  ANIONGAP 8    Recent Labs  Lab 02/25/23 1555  PROT 5.2*  ALBUMIN <1.5*  AST 15  ALT 11  ALKPHOS 71  BILITOT 0.4   Lipids No results for input(s): "CHOL", "TRIG", "HDL", "LABVLDL", "LDLCALC", "CHOLHDL" in the last 168 hours.  Hematology Recent Labs  Lab 02/25/23 1325  WBC 6.8  RBC 4.35  HGB 13.0  HCT 40.2  MCV 92.4  MCH 29.9  MCHC 32.3  RDW 14.9  PLT 282   Thyroid No results for input(s): "TSH", "FREET4" in the last 168 hours.  BNP Recent Labs  Lab 02/25/23 1325  BNP 3,916.7*    DDimer No results for input(s): "DDIMER" in the last 168 hours.  Radiology/Studies:  DG Chest 2 View Result Date: 02/25/2023 CLINICAL DATA:  shortness of breath for 1 day. History of diabetes, hypertension. Ex-smoker. EXAM: CHEST - 2 VIEW COMPARISON:  12/30/2022 FINDINGS: Median sternotomy for CABG. Atherosclerosis in the transverse aorta. Numerous leads and wires project over the chest. Midline trachea. Moderate cardiomegaly. A moderate left pleural effusion with possible mild loculation laterally. Tiny right pleural effusion. No pneumothorax. Interstitial edema is moderate, increased. Left-greater-than-right base airspace disease. IMPRESSION: Moderate congestive heart failure. Moderate to large left pleural effusion with possible mild loculation. Small right pleural effusion Left-greater-than-right base airspace disease, most likely atelectasis. At the left lung base, infection cannot be excluded. Aortic Atherosclerosis (ICD10-I70.0). Electronically Signed   By: Jeronimo Greaves M.D.   On:  02/25/2023 15:32   Assessment and Plan:   Acute on chronic HFrEF Bilateral pleural effusions  BNP 3,916 Echo from 11/2022 showed LVEF 25-30%  Negative troponin x 2  CXR today showed moderate CHF, bilateral pleural effusions, L > R base airspace disease  Patient reported he ran out of medications 2 weeks ago  Shortness of breath and swelling has been getting worse past two days Creatinine 2.76 today  -- Continue IV Lasix 60 mg BID -- Ordered left lateral decubitus CXR to see if patient is candidate for thoracentesis   CAD s/p CABG x 3 Hypertension Hyperlipidemia -- Continue Lipitor 80 mg daily  -- Continue hydralazine 25 mg TID -- Continue Imdur 60 mg daily  -- Continue Plavix 75 mg daily   Per primary CKD stage 4 COPD GERD T2DM  Risk Assessment/Risk Scores:        New York Heart Association (NYHA) Functional Class NYHA Class III   For questions or updates, please contact [facility] Please consult [email or web address] for contact info under    Signed, Olena Leatherwood, PA-C  02/25/2023 5:35 PM

## 2023-02-25 NOTE — Hospital Course (Addendum)
Colin [name] was admitted to the hospital with the working diagnosis of heart failure exacerbation.   72/M w CAD/CABG, CHF, EF 25-30%, type 2 diabetes, CKD 4 and hypertension presented to the ED with shortness of breath and edema. Patient's son reports that he ran out of meds few weeks ago and has been unable to obtain refills. Off medications he developed worsening edema and progressive dyspnea, to the point he was brought to the ED for further evaluation.  On his initial physical examination his blood pressure was 172/78. HR 65, RR 25 and 02 saturation 96%.  Lungs with rales bilaterally with no wheezing or rhonchi, heart with S1 and S2 present and regular with no gallops, or rubs, positive systolic murmur at the apex, abdomen with no distention and positive lower extremity edema.   Na 138, K 3,2 CL 105 bicarbonate 25 glucose 311, BUN 18 cr 2,76 BNP 3,916  High sensitive troponin 14 and 15  Wbc 6,8 hgb 13,0 plt 282  Urine analysis SG 1,005, protein 100, small hgb and no leukocytes.  Protein/ cr 7,42   Chest radiograph with cardiomegaly, bilateral hilar vascular congestion, with left pleural effusion, moderate to large.   EKG HR 77 bpm, right axis deviation, normal intervals, sinus rhythm, q waves V1 to V3, with no significant ST segment or T wave changes.   Patient was placed on IV furosemide for diuresis.   01/22 left thoracentesis 700 ml fluid darined.  01/23 responding to diuresis Add ted hose  01/24 clinically improving edema, renal function not yet stable.  01/25 renal function has stabilized at 3,15,. Plan for discharge home and follow up as outpatient.  New refills for his medications.

## 2023-02-25 NOTE — H&P (Addendum)
History and Physical    Patient: Colin [name] GUY:[id number] DOB: 1950/11/21 DOA: 02/25/2023 DOS: the patient was seen and examined on 02/25/2023 PCP: Grayce Sessions, NP  Patient coming from: Home  Chief Complaint:  Chief Complaint  Patient presents with   Shortness of Breath   HPI: Colin [name] is a 73 y.o. male with medical history significant of CAD status post CABG x 3, chronic combined systolic and diastolic heart failure, type 2 diabetes, hyperlipidemia, hypertension, history of NSTEMI, GERD, CKD stage IV presenting with shortness of breath and volume overload.  Patient's son present at bedside.  Patient and son both helped to provide history.  Patient has a history of significant heart disease.  He has been admitted several times over the past few months for heart failure exacerbation.  He was doing fine at home but he ran out all of his home medications for the past 2 weeks and has been unable to obtain refills.  Per son, patient does have a cardiologist, Dr. Rosemary Holms, and a primary care doctor, Dr. Randa Evens, but they have been unable to schedule earlier appointments with them and thus patient has ran out of his medications.  Since running out of his medications, patient has developed progressively worsening shortness of breath and volume overload.  He has had increasing swelling in his legs, arms, and stomach.  Given that his swelling and shortness of breath were becoming worse, patient was brought to the ED for further evaluation.  ED course: Vital signs with mild tachypnea and hypertension, saturating 96% on room air.  CBC unremarkable.  BMP with hypokalemia to 3.2, hyperglycemia to 311, creatinine 2.76 with GFR 24 (baseline creatinine appears to be around 2.7-3).  Hs-Troponins normal x 2.  BNP 3916.  Chest x-ray with significant vascular congestion.   Review of Systems: As mentioned in the history of present illness. All other systems reviewed and are negative. Past Medical  History:  Diagnosis Date   Acute hypoxic respiratory failure (HCC) 12/16/2021   Acute pulmonary edema (HCC)    Arrhythmia    CAD (coronary artery disease)    a. s/p stent x 2; b. 08/2020 CABG x 3 (LIMA->LAD, VG->PDA, VG->OM1).   Carotid artery occlusion    Chronic combined systolic and diastolic heart failure (HCC)    Chronic HFrEF (heart failure with reduced ejection fraction) (HCC)    a. 11/2022 Echo: EF 25-30%, glob HK, GrI DD, nl RV fxn, mod dil LA, mild-mod MR.   Diabetes mellitus    Dyslipidemia    Essential hypertension    Ischemic cardiomyopathy    a. 11/2022 Echo: EF 25-30%.   Left upper quadrant abdominal pain 05/30/2022   NSTEMI (non-ST elevated myocardial infarction) [facility])    Past Surgical History:  Procedure Laterality Date   CARDIAC CATHETERIZATION     01/2010 ; 04/2010   CORONARY ARTERY BYPASS GRAFT N/A 08/23/2020   Procedure: CORONARY ARTERY BYPASS GRAFTING (CABG) x 3 ON CARDIOPULMONARY BYPASS USING EVH AND PARTIALLY OPEN HARVESTED LEFT GREATER SAPHENOUS VEIN AND LEFT INTERNAL MAMMARY ARTERY.  LIMA TO LAD, SVG TO OM, SVG TO PDA;  Surgeon: Corliss Skains, MD;  Location: [facility] OR;  Service: Open Heart Surgery;  Laterality: N/A;   RIGHT HEART CATH N/A 12/19/2021   Procedure: RIGHT HEART CATH;  Surgeon: [name], MD;  Location: [facility] INVASIVE CV LAB;  Service: Cardiovascular;  Laterality: N/A;   RIGHT/LEFT HEART CATH AND CORONARY ANGIOGRAPHY N/A 08/22/2020   Procedure: RIGHT/LEFT HEART CATH AND CORONARY  ANGIOGRAPHY;  Surgeon: [name], MD;  Location: [facility] INVASIVE CV LAB;  Service: Cardiovascular;  Laterality: N/A;   TEE WITHOUT CARDIOVERSION N/A 08/23/2020   Procedure: TRANSESOPHAGEAL ECHOCARDIOGRAM (TEE);  Surgeon: Corliss Skains, MD;  Location: [facility] OR;  Service: Open Heart Surgery;  Laterality: N/A;   TEMPORARY PACEMAKER N/A 08/22/2020   Procedure: TEMPORARY PACEMAKER;  Surgeon: [name], MD;  Location: [facility] INVASIVE CV LAB;  Service:  Cardiovascular;  Laterality: N/A;   VEIN HARVEST Left 08/23/2020   Procedure: ENDOVEIN HARVEST AND PARTIALLY OPEN GREATER SAPHENOUS VEIN HARVESTING;  Surgeon: Corliss Skains, MD;  Location: [facility] OR;  Service: Open Heart Surgery;  Laterality: Left;   Social History:  reports that he quit smoking about 2 years ago. His smoking use included cigarettes. He started smoking about 52 years ago. He has a 12.5 pack-year smoking history. He has never used smokeless tobacco. He reports that he does not drink alcohol and does not use drugs.  No Known Allergies  Family History  Problem Relation Age of Onset   Diabetes Neg Hx     Prior to Admission medications   Medication Sig Start Date End Date Taking? Authorizing Provider  atorvastatin (LIPITOR) 80 MG tablet Take 1 tablet (80 mg total) by mouth daily. 11/28/22   Zannie Cove, MD  carvedilol (COREG) 6.25 MG tablet Take 1 tablet (6.25 mg total) by mouth 2 (two) times daily with a meal. 01/04/23 02/03/23  Noralee Stain, DO  clopidogrel (PLAVIX) 75 MG tablet Take 1 tablet (75 mg total) by mouth daily. 01/05/23   Noralee Stain, DO  Continuous Glucose Receiver (FREESTYLE LIBRE 3 READER) DEVI Use as directed! 02/06/22   Reather Littler, MD  Continuous Glucose Sensor (FREESTYLE LIBRE 3 SENSOR) MISC Use as instructed change every 14 days 08/01/22   Altamese Kenly, MD  furosemide (LASIX) 40 MG tablet Take 1 tablet (40 mg total) by mouth daily. 11/29/22   Zannie Cove, MD  hydrALAZINE (APRESOLINE) 25 MG tablet Take 1 tablet (25 mg total) by mouth every 8 (eight) hours. 01/04/23 02/03/23  Noralee Stain, DO  insulin glargine (LANTUS) 100 UNIT/ML Solostar Pen Inject 15 Units into the skin at bedtime. 12/08/22   Arrien, York Ram, MD  insulin lispro (ADMELOG SOLOSTAR) 100 UNIT/ML KwikPen Inject 3 Units into the skin 3 (three) times daily with meals. 12/08/22   Arrien, York Ram, MD  Insulin Pen Needle (PEN NEEDLES 3/16") 31G X 5 MM MISC Uses three times  daily with insulin. 05/24/22   Shamleffer, Konrad Dolores, MD  Insulin Pen Needle 32G X 4 MM MISC Use as directed with Humalog and Lantus pens. 12/08/22   Arrien, York Ram, MD  isosorbide mononitrate (IMDUR) 60 MG 24 hr tablet Take 1 tablet (60 mg total) by mouth daily. 11/28/22 11/23/23  Zannie Cove, MD  levothyroxine (SYNTHROID) 25 MCG tablet Take 1 tablet (25 mcg total) by mouth daily at 6 (six) AM. 12/08/22 01/07/23  Arrien, York Ram, MD  nitroGLYCERIN (NITROSTAT) 0.4 MG SL tablet Place 1 tab (0.4mg  total) under the tongue every 5 minutes as needed for chest pain 02/14/22   [name], MD    Physical Exam: Vitals:   02/25/23 1426 02/25/23 1600 02/25/23 1700 02/25/23 1748  BP: (!) 172/78 (!) 175/87 (!) 151/67   Pulse: 84 78 65   Resp:  (!) 22 (!) 25   Temp:    97.6 F (36.4 C)  TempSrc:      SpO2:  98% 96%  Weight:      Height:       Physical Exam Constitutional:      Appearance: He is well-developed. He is not ill-appearing.  HENT:     Head: Normocephalic and atraumatic.     Mouth/Throat:     Mouth: Mucous membranes are moist.     Pharynx: Oropharynx is clear.  Eyes:     Extraocular Movements: Extraocular movements intact.     Pupils: Pupils are equal, round, and reactive to light.  Cardiovascular:     Rate and Rhythm: Normal rate and regular rhythm.     Heart sounds: Normal heart sounds. No murmur heard.    No friction rub. No gallop.     Comments: + JVD Pulmonary:     Effort: Pulmonary effort is normal. No accessory muscle usage or respiratory distress.     Breath sounds: No wheezing or rhonchi.     Comments: Bibasilar rales noted on exam, L > R. Saturating >95% on RA. Abdominal:     General: Bowel sounds are normal.     Palpations: Abdomen is soft.     Tenderness: There is no abdominal tenderness. There is no guarding or rebound.     Comments: 1+ dependent sacral edema  Musculoskeletal:        General: Normal range of motion.     Cervical  back: Normal range of motion.     Comments: 2+ pitting edema up to mid-thighs bilaterally. 1+ edema in bilateral arms up to mid-forearms.  Skin:    General: Skin is warm and dry.  Neurological:     General: No focal deficit present.     Mental Status: He is alert and oriented to person, place, and time.  Psychiatric:        Mood and Affect: Mood normal.        Behavior: Behavior normal.     Data Reviewed:  There are no new results to review at this time.  Assessment and Plan: No notes have been filed under this hospital service. Service: Hospitalist   Acute on chronic combined systolic and diastolic heart failure Bilateral pleural effusions, L > R Patient presenting with progressively worsening shortness of breath and anasarca in the setting of medication noncompliance for the past 2 weeks.  BNP elevated to 3916.  Chest x-ray with bilateral pleural effusions.  He is significantly volume overloaded on exam.  Echocardiogram in 11/2022 with LVEF 25 to 30%, G1 DD.  Cardiology consulted, appreciate assistance.  Received Coreg in the ED but will hold this until he is more euvolemic. -Cardiology following, appreciate assistance -IV Lasix 80 mg twice daily -BiDil 20-37.5mg  TID -holding home coreg until more euvolemic  -cardiology recommending IR thoracentesis to help with pleural effusions  -unfortunately, I am unable to reach [facility] translation services at this time for consent  -will need to reach out tomorrow morning to obtain consent prior to thoracentesis -Incentive spirometry  Hypokalemia -K 3.2 on arrival -PO potassium x2 doses -check K and Mg tomorrow morning, goal K >4.0 and Mg >2.0 while diuresing  CKD Stage 4 Patient appears to have a baseline Cr ~2.7-3, consistent with CKD Stage 4. On admission, Cr 2.76. He does not have nephrology follow up in the outpatient setting. I have reached out to nephrology, Dr. Juel Burrow, who will come to evaluate patient and help patient  get connected with outpatient nephrology. Will need to monitor renal function closely while providing diuresis. -greatly appreciate nephrology assistance -trend renal function while diuresing -  avoid nephrotoxic agents as able -monitor UOP, strict I/O's  CAD s/p CABG x3 HTN HLD BP initially 192/88, improved to 151/67. Troponins negative x2. Will start BiDiL in place of home hydralazine and imdur per cardiology. Holding home coreg until patient is more euvolemic. -continue lipitor 80mg  daily -continue plavix 75mg  daily -BiDiL 20-37.5mg  TID  Uncontrolled T2DM with hyperglycemia Patient with history of uncontrolled type 2 diabetes.  A1c 11.6% today.  Patient is on Lantus 15 units nightly and lispro 3 units 3 times daily with meals at home.  However, patient has been out of his medications for the past 2 weeks.  Random blood glucose 311 on arrival. -Semglee 15 units nightly -Sensitive sliding scale -Trend CBGs, goal 140-180  Subclinical hypothyroidism Noted during prior admission. TSH 12.8 but free T4 and total T3 were normal. Has been off of synthroid for the past 2 weeks due to running out of medications. Will check TSH and free T4 again today.  -f/u TSH and free T4 -resume home synthroid daily    Advance Care Planning:   Code Status: Full Code   Consults: cardiology, nephrology  Family Communication: updated son at bedside  Severity of Illness: The appropriate patient status for this patient is INPATIENT. Inpatient status is judged to be reasonable and necessary in order to provide the required intensity of service to ensure the patient's safety. The patient's presenting symptoms, physical exam findings, and initial radiographic and laboratory data in the context of their chronic comorbidities is felt to place them at high risk for further clinical deterioration. Furthermore, it is not anticipated that the patient will be medically stable for discharge from the hospital within 2  midnights of admission.   * I certify that at the point of admission it is my clinical judgment that the patient will require inpatient hospital care spanning beyond 2 midnights from the point of admission due to high intensity of service, high risk for further deterioration and high frequency of surveillance required.*  Portions of this note were generated with Dragon dictation software. Dictation errors may occur despite best attempts at proofreading.   Author: Briscoe Burns, MD 02/25/2023 6:29 PM  For on call review [email or web address].

## 2023-02-25 NOTE — ED Triage Notes (Signed)
Pt c/o shortness of breath and swelling in legs and hands. Pt states this started this morning. Pt recently hospitalized in [place].

## 2023-02-25 NOTE — ED Provider Notes (Signed)
[place] EMERGENCY DEPARTMENT AT [facility] Provider Note   CSN: [id number] Arrival date & time: 02/25/23  1306     History  Chief Complaint  Patient presents with   Shortness of Breath    Colin [name] is a 73 y.o. male.  Patient is a 73 year old male with a past medical history of CHF, diabetes, hypertension, CAD status post CABG, COPD presenting to the emergency department with shortness of breath.  Patient is here with his family member who helps provide history and states that he has had increasing shortness of breath over the last few days.  He states that he is been too weak to get up and walk and feels more short of breath upon standing.  He states that he ran out of his medications about 2 weeks ago.  He states that he was admitted for similar symptoms last month.  He denies any associated chest pain, fever or cough.  He states he has had significant swelling in his hands and feet.  The history is provided by the patient and a relative. No language interpreter was used (Patient preferred family to translate).  Shortness of Breath      Home Medications Prior to Admission medications   Medication Sig Start Date End Date Taking? Authorizing Provider  atorvastatin (LIPITOR) 80 MG tablet Take 1 tablet (80 mg total) by mouth daily. 11/28/22   Zannie Cove, MD  carvedilol (COREG) 6.25 MG tablet Take 1 tablet (6.25 mg total) by mouth 2 (two) times daily with a meal. 01/04/23 02/03/23  Noralee Stain, DO  clopidogrel (PLAVIX) 75 MG tablet Take 1 tablet (75 mg total) by mouth daily. 01/05/23   Noralee Stain, DO  Continuous Glucose Receiver (FREESTYLE LIBRE 3 READER) DEVI Use as directed! 02/06/22   Reather Littler, MD  Continuous Glucose Sensor (FREESTYLE LIBRE 3 SENSOR) MISC Use as instructed change every 14 days 08/01/22   Altamese Skykomish, MD  furosemide (LASIX) 40 MG tablet Take 1 tablet (40 mg total) by mouth daily. 11/29/22   Zannie Cove, MD  hydrALAZINE (APRESOLINE)  25 MG tablet Take 1 tablet (25 mg total) by mouth every 8 (eight) hours. 01/04/23 02/03/23  Noralee Stain, DO  insulin glargine (LANTUS) 100 UNIT/ML Solostar Pen Inject 15 Units into the skin at bedtime. 12/08/22   Arrien, York Ram, MD  insulin lispro (ADMELOG SOLOSTAR) 100 UNIT/ML KwikPen Inject 3 Units into the skin 3 (three) times daily with meals. 12/08/22   Arrien, York Ram, MD  Insulin Pen Needle (PEN NEEDLES 3/16") 31G X 5 MM MISC Uses three times daily with insulin. 05/24/22   Shamleffer, Konrad Dolores, MD  Insulin Pen Needle 32G X 4 MM MISC Use as directed with Humalog and Lantus pens. 12/08/22   Arrien, York Ram, MD  isosorbide mononitrate (IMDUR) 60 MG 24 hr tablet Take 1 tablet (60 mg total) by mouth daily. 11/28/22 11/23/23  Zannie Cove, MD  levothyroxine (SYNTHROID) 25 MCG tablet Take 1 tablet (25 mcg total) by mouth daily at 6 (six) AM. 12/08/22 01/07/23  Arrien, York Ram, MD  nitroGLYCERIN (NITROSTAT) 0.4 MG SL tablet Place 1 tab (0.4mg  total) under the tongue every 5 minutes as needed for chest pain 02/14/22   Patwardhan, Anabel Bene, MD      Allergies    Patient has no known allergies.    Review of Systems   Review of Systems  Respiratory:  Positive for shortness of breath.     Physical Exam Updated Vital Signs BP Marland Kitchen)  175/87   Pulse 78   Temp (!) 97.4 F (36.3 C) (Oral)   Resp (!) 22   Ht 5\' 2"  (1.575 m)   Wt 60 kg   SpO2 98%   BMI 24.19 kg/m  Physical Exam Vitals and nursing note reviewed.  Constitutional:      General: He is not in acute distress.    Appearance: He is well-developed.  HENT:     Head: Normocephalic and atraumatic.     Mouth/Throat:     Mouth: Mucous membranes are moist.  Eyes:     Extraocular Movements: Extraocular movements intact.  Neck:     Vascular: JVD (~3 cm above clavicle) present.  Cardiovascular:     Rate and Rhythm: Normal rate and regular rhythm.     Heart sounds: Normal heart sounds.  Pulmonary:      Effort: Pulmonary effort is normal.     Breath sounds: Examination of the right-lower field reveals decreased breath sounds. Examination of the left-lower field reveals decreased breath sounds. Decreased breath sounds present.  Chest:     Chest wall: No tenderness.  Abdominal:     Palpations: Abdomen is soft.  Musculoskeletal:     Cervical back: Normal range of motion and neck supple.     Right lower leg: Edema (2+ to thigh) present.     Left lower leg: Edema (2+ to thigh) present.     Comments: 2+ edema to bilateral hands  Skin:    General: Skin is warm and dry.  Neurological:     General: No focal deficit present.     Mental Status: He is alert and oriented to person, place, and time.     Motor: No weakness.  Psychiatric:        Mood and Affect: Mood normal.        Behavior: Behavior normal.     ED Results / Procedures / Treatments   Labs (all labs ordered are listed, but only abnormal results are displayed) Labs Reviewed  BASIC METABOLIC PANEL - Abnormal; Notable for the following components:      Result Value   Potassium 3.2 (*)    Glucose, Bld 311 (*)    Creatinine, Ser 2.76 (*)    Calcium 8.3 (*)    GFR, Estimated 24 (*)    All other components within normal limits  BRAIN NATRIURETIC PEPTIDE - Abnormal; Notable for the following components:   B Natriuretic Peptide 3,916.7 (*)    All other components within normal limits  CBC  HEPATIC FUNCTION PANEL  TROPONIN I (HIGH SENSITIVITY)  TROPONIN I (HIGH SENSITIVITY)    EKG EKG Interpretation Date/Time:  Monday February 25 2023 13:26:04 EST Ventricular Rate:  77 PR Interval:    QRS Duration:  106 QT Interval:  406 QTC Calculation: 459 R Axis:   80  Text Interpretation: Normal sinus rhythm Anteroseptal infarct , age undetermined Abnormal ECG Baseline wander T-wave inversions V4-V6 resolved compared to prior EKG Confirmed by Elayne Snare ([id number]) on 02/25/2023 1:57:07 PM  Radiology DG Chest 2 View Result Date:  02/25/2023 CLINICAL DATA:  shortness of breath for 1 day. History of diabetes, hypertension. Ex-smoker. EXAM: CHEST - 2 VIEW COMPARISON:  12/30/2022 FINDINGS: Median sternotomy for CABG. Atherosclerosis in the transverse aorta. Numerous leads and wires project over the chest. Midline trachea. Moderate cardiomegaly. A moderate left pleural effusion with possible mild loculation laterally. Tiny right pleural effusion. No pneumothorax. Interstitial edema is moderate, increased. Left-greater-than-right base airspace disease. IMPRESSION: Moderate congestive heart  failure. Moderate to large left pleural effusion with possible mild loculation. Small right pleural effusion Left-greater-than-right base airspace disease, most likely atelectasis. At the left lung base, infection cannot be excluded. Aortic Atherosclerosis (ICD10-I70.0). Electronically Signed   By: Jeronimo Greaves M.D.   On: 02/25/2023 15:32    Procedures Procedures    Medications Ordered in ED Medications  furosemide (LASIX) injection 40 mg (40 mg Intravenous Given 02/25/23 1429)  carvedilol (COREG) tablet 6.25 mg (6.25 mg Oral Given 02/25/23 1426)  potassium chloride SA (KLOR-CON M) CR tablet 40 mEq (40 mEq Oral Given 02/25/23 1553)    ED Course/ Medical Decision Making/ A&P Clinical Course as of 02/25/23 1620  Mon Feb 25, 2023  1447 Cr improved from last hospitalization, mild hyperglycemia, no elevated AGAP making DKA unlikely. Mild hypokalemia, will be repleted. Initial troponin normal.  [VK]  1512 On my evaluate of CXR, appears to have new L-sided pleural effusion with pulmonary edema. CXR read and BNP pending. [VK]  1526 BNP significantly elevated. With significant volume overload, patient recommended admission for diuresis. [VK]    Clinical Course User Index [VK] Rexford Maus, DO                                 Medical Decision Making This patient presents to the ED with chief complaint(s) of SOB with pertinent past medical  history of CAD, CHF, HTN, COPD which further complicates the presenting complaint. The complaint involves an extensive differential diagnosis and also carries with it a high risk of complications and morbidity.    The differential diagnosis includes ACS, arrhythmia, anemia, pneumonia, pneumothorax, pulmonary edema, pleural effusion, CHF exacerbation, no focal neurologic deficits making CVA or TIA unlikely, infection, hypertensive urgency or emergency  Additional history obtained: Additional history obtained from family Records reviewed previous admission documents  ED Course and Reassessment: On patient's arrival he is hypertensive and mildly tachypneic otherwise satting well on room air and in no acute respiratory distress.  He does appear acutely volume overloaded with significant edema to his hands and lower extremity with mildly elevated JVD.  EKG on arrival had resolution of previous T wave inversions and otherwise no acute ischemic findings.  Patient will have labs including troponin and BNP, chest x-ray and will be started on Lasix as well as given his home Coreg and will be closely reassessed.  Independent labs interpretation:  The following labs were independently interpreted: significant elevated BNP  Independent visualization of imaging: - I independently visualized the following imaging with scope of interpretation limited to determining acute life threatening conditions related to emergency care: CXR, which revealed large L-sided pleural effusion with pulmonary edema  Consultation: - Consulted or discussed management/test interpretation w/ external professional: hospitalist  Consideration for admission or further workup: patient requires admission for CHF exacerbation Social Determinants of health: N/A    Amount and/or Complexity of Data Reviewed Labs: ordered. Radiology: ordered.  Risk Prescription drug management. Decision regarding  hospitalization.          Final Clinical Impression(s) / ED Diagnoses Final diagnoses:  Acute on chronic congestive heart failure, unspecified heart failure type (HCC)  Pleural effusion on left    Rx / DC Orders ED Discharge Orders     None         Rexford Maus, DO 02/25/23 1620

## 2023-02-25 NOTE — ED Notes (Signed)
Got patient intyo a gown on the monitor patient is resting with family at bedside and call bell in reach

## 2023-02-26 ENCOUNTER — Inpatient Hospital Stay (HOSPITAL_COMMUNITY): Payer: 59

## 2023-02-26 DIAGNOSIS — I5023 Acute on chronic systolic (congestive) heart failure: Secondary | ICD-10-CM

## 2023-02-26 DIAGNOSIS — I5043 Acute on chronic combined systolic (congestive) and diastolic (congestive) heart failure: Secondary | ICD-10-CM | POA: Diagnosis not present

## 2023-02-26 LAB — GLUCOSE, CAPILLARY
Glucose-Capillary: 245 mg/dL — ABNORMAL HIGH (ref 70–99)
Glucose-Capillary: 122 mg/dL — ABNORMAL HIGH (ref 70–99)

## 2023-02-26 LAB — T4, FREE: Free T4: 0.89 ng/dL (ref 0.61–1.12)

## 2023-02-26 LAB — ECHOCARDIOGRAM COMPLETE
Area-P 1/2: 5.66 cm2
Calc EF: 35.2 %
Height: 62 in
MV M vel: 7.06 m/s
MV Peak grad: 199.4 mm[Hg]
Radius: 0.6 cm
S' Lateral: 4.72 cm
Single Plane A2C EF: 36.3 %
Single Plane A4C EF: 39 %
Weight: 2116.42 [oz_av]

## 2023-02-26 LAB — BASIC METABOLIC PANEL
Anion gap: 10 (ref 5–15)
BUN: 26 mg/dL — ABNORMAL HIGH (ref 8–23)
CO2: 29 mmol/L (ref 22–32)
Calcium: 8.2 mg/dL — ABNORMAL LOW (ref 8.9–10.3)
Chloride: 104 mmol/L (ref 98–111)
Creatinine, Ser: 2.78 mg/dL — ABNORMAL HIGH (ref 0.61–1.24)
GFR, Estimated: 23 mL/min — ABNORMAL LOW (ref >60)
Glucose, Bld: 242 mg/dL — ABNORMAL HIGH (ref 70–99)
Potassium: 4 mmol/L (ref 3.5–5.1)
Sodium: 143 mmol/L (ref 135–145)

## 2023-02-26 LAB — CBG MONITORING, ED
Glucose-Capillary: 41 mg/dL — CL (ref 70–99)
Glucose-Capillary: 87 mg/dL (ref 70–99)

## 2023-02-26 LAB — PROTEIN / CREATININE RATIO, URINE
Creatinine, Urine: 12 mg/dL
Protein Creatinine Ratio: 7.42 mg/mg{creat} — ABNORMAL HIGH (ref 0.00–0.15)
Total Protein, Urine: 89 mg/dL

## 2023-02-26 LAB — URINALYSIS, ROUTINE W REFLEX MICROSCOPIC
Bacteria, UA: NONE SEEN
Bilirubin Urine: NEGATIVE
Glucose, UA: 150 mg/dL — AB
Ketones, ur: NEGATIVE mg/dL
Leukocytes,Ua: NEGATIVE
Nitrite: NEGATIVE
Protein, ur: 100 mg/dL — AB
Specific Gravity, Urine: 1.005 (ref 1.005–1.030)
pH: 7 (ref 5.0–8.0)

## 2023-02-26 LAB — TSH: TSH: 8.509 u[IU]/mL — ABNORMAL HIGH (ref 0.350–4.500)

## 2023-02-26 LAB — MAGNESIUM: Magnesium: 1.9 mg/dL (ref 1.7–2.4)

## 2023-02-26 LAB — LACTATE DEHYDROGENASE: LDH: 208 U/L — ABNORMAL HIGH (ref 98–192)

## 2023-02-26 MED ORDER — INSULIN ASPART 100 UNIT/ML IJ SOLN
3.0000 [IU] | Freq: Three times a day (TID) | INTRAMUSCULAR | Status: DC
  Administered 2023-02-26: 3 [IU] via SUBCUTANEOUS

## 2023-02-26 MED ORDER — ALBUMIN HUMAN 25 % IV SOLN
25.0000 g | Freq: Once | INTRAVENOUS | Status: AC
  Administered 2023-02-27: 25 g via INTRAVENOUS
  Filled 2023-02-26: qty 100

## 2023-02-26 MED ORDER — ALBUMIN HUMAN 25 % IV SOLN: 25.0000 g | Freq: Once | INTRAVENOUS | Status: DC

## 2023-02-26 MED ORDER — PERFLUTREN LIPID MICROSPHERE
1.0000 mL | INTRAVENOUS | Status: AC | PRN
  Administered 2023-02-26: 1 mL via INTRAVENOUS

## 2023-02-26 NOTE — Progress Notes (Signed)
Heart Failure Navigator Progress Note  Assessed for Heart & Vascular TOC clinic readiness.  Patient does not meet criteria due to Advanced Heart Failure Team patient of Dr. Elwyn Lade.   Navigator will sign off at this time.   Rhae Hammock, BSN, Scientist, clinical (histocompatibility and immunogenetics) Only

## 2023-02-26 NOTE — Plan of Care (Signed)
  Problem: Skin Integrity: Goal: Risk for impaired skin integrity will decrease Outcome: Progressing   Problem: Clinical Measurements: Goal: Respiratory complications will improve Outcome: Progressing   Problem: Coping: Goal: Level of anxiety will decrease Outcome: Progressing

## 2023-02-26 NOTE — ED Notes (Signed)
MD at bedside speaking with pt and translator Science writer)

## 2023-02-26 NOTE — Consult Note (Addendum)
Reason for Consult: Renal failure Referring Physician:  Dr. Austin Miles  Chief Complaint: Shortness of breath  Assessment/Plan: CKD4 presumably secondary to hypertension and diabetes with baseline creatinine in the 2.7-3 range presenting with heart failure exacerbation. 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 Children speak English but are often not available. -Will check a urinalysis and UPC to quantify -Continue diuresis for now and again fortunately she is responding very nicely with over 1700 mL out since presenting to the hospital already.  He is on Lasix 40 mg daily at home. -Renal ultrasound for baseline kidney size, not expecting obstruction but utilizing the translator he does complain of some fullness after voiding.  Will also check a PVR, discussed with nurse bedside. -Will also schedule outpatient appointment for follow-up with [facility] as he has pretty advanced CKD.  -His kidney function is basically at his baseline; signing off at this time; please reconsult as needed.  I have ensured that he will have follow-up in the next 2 to 4 weeks.  The office will contact him and I also notified him through a translator that we will be getting him follow-up given that he eventually will be on dialysis in the coming years.  -Monitor Daily I/Os, Daily weight  -Maintain MAP>65 for optimal renal perfusion.  - Avoid nephrotoxic agents such as IV contrast, NSAIDs, and phosphate containing bowel preps (FLEETS)  Combined systolic and diastolic heart failure -patient ran out of his medications for a few weeks.  I counseled him that he needs to contact his physician's office to get refills when the bottle is already half empty if there are no refills available.  Fortunately he has responded very well to diuretics here in the hospital with great urine output and slightly improvement in his shortness of breath. Colin [name] as post CABG x 3 time of consultation she denies any  chest pain or pressure.-  Type 2 diabetes -had for over 20 years and laser eye therapy for retinopathy as well as peripheral neuropathy present as well Hyperlipidemia Hypertension -restart home regiment   HPI: Colin [name] is an 73 y.o. male Colin [name] with history of coronary atherosclerotic heart disease status post CABG x 3,combined systolic and diastolic heart failure, type 2 diabetes, hyperlipidemia, hypertension, history of non-ST elevation MI, CKD stage IV.  Patient has been admitted a few times over the past few months for heart failure exacerbation.  Unfortunately he ran out of all his home medications for the past 2 weeks and has been unable to refill taking refills.  Patient was unable to schedule follow-up appointments with his primary cardiologist and primary care doctor.  Currently patient had progressively worsening shortness of breath as well as swelling especially in the legs.  Emergency department patient was tachypneic and hypertensive saturating 96% on room air.  BNP was 3916 with chest x-ray consistent with volume overload showing vascular congestion.  Patient's baseline creatinine appears to be in the 2.7-3 range.  ROS Pertinent items are noted in HPI.  Chemistry and CBC: Creatinine, Ser  Date/Time Value Ref Range Status  02/25/2023 01:25 PM 2.76 (H) 0.61 - 1.24 mg/dL Final  19/14/7829 56:21 AM 4.93 (H) 0.61 - 1.24 mg/dL Final  30/86/5784 69:62 AM 4.51 (H) 0.61 - 1.24 mg/dL Final  95/28/4132 44:01 AM 4.18 (H) 0.61 - 1.24 mg/dL Final  02/72/5366 44:03 AM 3.95 (H) 0.61 - 1.24 mg/dL Final  47/42/5956 38:75 AM 4.03 (H) 0.61 - 1.24 mg/dL Final  64/33/2951 88:41 AM 3.20 (H) 0.61 -  1.24 mg/dL Final  04/54/0981 19:14 AM 3.10 (H) 0.61 - 1.24 mg/dL Final  78/29/5621 30:86 AM 3.11 (H) 0.61 - 1.24 mg/dL Final  57/84/6962 95:28 AM 2.72 (H) 0.61 - 1.24 mg/dL Final  41/32/4401 02:72 AM 2.78 (H) 0.61 - 1.24 mg/dL Final  53/66/4403 47:42 PM 3.03 (H) 0.61 - 1.24 mg/dL  Final  59/56/3875 64:33 AM 3.04 (H) 0.61 - 1.24 mg/dL Final  29/51/8841 66:06 AM 3.09 (H) 0.61 - 1.24 mg/dL Final  30/16/0109 32:35 AM 3.34 (H) 0.61 - 1.24 mg/dL Final  57/32/2025 42:70 AM 2.60 (H) 0.61 - 1.24 mg/dL Final  62/37/6283 15:17 AM 2.40 (H) 0.61 - 1.24 mg/dL Final  61/60/7371 06:26 PM 2.40 (H) 0.61 - 1.24 mg/dL Final  94/85/4627 03:50 AM 2.28 (H) 0.40 - 1.50 mg/dL Final  09/38/1829 93:71 PM 2.28 (H) 0.61 - 1.24 mg/dL Final  69/67/8938 10:17 PM 2.49 (H) 0.76 - 1.27 mg/dL Final  51/03/5850 77:82 AM 2.14 (H) 0.61 - 1.24 mg/dL Final  42/35/3614 43:15 AM 2.31 (H) 0.61 - 1.24 mg/dL Final  40/09/6759 95:09 PM 1.92 (H) 0.76 - 1.27 mg/dL Final  32/67/1245 80:99 AM 3.44 (H) 0.61 - 1.24 mg/dL Final  83/38/2505 39:76 AM 3.41 (H) 0.61 - 1.24 mg/dL Final  73/41/9379 02:40 AM 3.82 (H) 0.61 - 1.24 mg/dL Final  97/35/3299 24:26 AM 3.10 (H) 0.61 - 1.24 mg/dL Final  83/41/9622 29:79 AM 2.82 (H) 0.61 - 1.24 mg/dL Final  89/21/1941 74:08 AM 2.63 (H) 0.61 - 1.24 mg/dL Final  14/48/1856 31:49 AM 2.21 (H) 0.61 - 1.24 mg/dL Final  70/26/3785 88:50 PM 2.06 (H) 0.61 - 1.24 mg/dL Final  27/74/1287 86:76 AM 1.51 (H) 0.40 - 1.50 mg/dL Final  72/10/4707 62:83 AM 1.51 (H) 0.40 - 1.50 mg/dL Final  66/29/4765 46:50 PM 1.42 0.40 - 1.50 mg/dL Final  35/46/5681 27:51 AM 1.03 0.76 - 1.27 mg/dL Final  70/02/7492 49:67 AM 1.34 (H) 0.61 - 1.24 mg/dL Final  59/16/3846 65:99 AM 1.36 (H) 0.61 - 1.24 mg/dL Final  35/70/1779 39:03 AM 1.51 (H) 0.61 - 1.24 mg/dL Final  00/92/3300 76:22 AM 1.38 (H) 0.61 - 1.24 mg/dL Final    Comment:    DELTA CHECK NOTED  08/25/2020 04:31 AM 1.51 (H) 0.61 - 1.24 mg/dL Final  63/33/5456 25:63 PM 1.60 (H) 0.61 - 1.24 mg/dL Final  89/37/3428 76:81 AM 1.36 (H) 0.61 - 1.24 mg/dL Final    Comment:    DELTA CHECK NOTED  08/23/2020 04:22 PM 1.10 0.61 - 1.24 mg/dL Final  15/72/6203 55:97 PM 1.00 0.61 - 1.24 mg/dL Final  41/63/8453 64:68 PM 1.20 0.61 - 1.24 mg/dL Final  04/26/2246 25:00 PM  1.00 0.61 - 1.24 mg/dL Final  37/05/8887 16:94 AM 1.27 (H) 0.61 - 1.24 mg/dL Final  50/38/8828 00:34 AM 1.29 (H) 0.61 - 1.24 mg/dL Final  91/79/1505 69:79 AM 1.49 (H) 0.61 - 1.24 mg/dL Final  48/02/6551 74:82 AM 1.54 (H) 0.61 - 1.24 mg/dL Final  70/78/6754 49:20 AM 1.19 0.61 - 1.24 mg/dL Final   Recent Labs  Lab 02/25/23 1325  NA 138  K 3.2*  CL 105  CO2 25  GLUCOSE 311*  BUN 18  CREATININE 2.76*  CALCIUM 8.3*   Recent Labs  Lab 02/25/23 1325  WBC 6.8  HGB 13.0  HCT 40.2  MCV 92.4  PLT 282   Liver Function Tests: Recent Labs  Lab 02/25/23 1555  AST 15  ALT 11  ALKPHOS 71  BILITOT 0.4  PROT 5.2*  ALBUMIN <1.5*   No  results for input(s): "LIPASE", "AMYLASE" in the last 168 hours. No results for input(s): "AMMONIA" in the last 168 hours. Cardiac Enzymes: No results for input(s): "CKTOTAL", "CKMB", "CKMBINDEX", "TROPONINI" in the last 168 hours. Iron Studies: No results for input(s): "IRON", "TIBC", "TRANSFERRIN", "FERRITIN" in the last 72 hours. PT/INR: @LABRCNTIP (inr:5)  Xrays/Other Studies: ) Results for orders placed or performed during the hospital encounter of 02/25/23 (from the past 48 hours)  Basic metabolic panel     Status: Abnormal   Collection Time: 02/25/23  1:25 PM  Result Value Ref Range   Sodium 138 135 - 145 mmol/L   Potassium 3.2 (L) 3.5 - 5.1 mmol/L   Chloride 105 98 - 111 mmol/L   CO2 25 22 - 32 mmol/L   Glucose, Bld 311 (H) 70 - 99 mg/dL    Comment: Glucose reference range applies only to samples taken after fasting for at least 8 hours.   BUN 18 8 - 23 mg/dL   Creatinine, Ser 1.61 (H) 0.61 - 1.24 mg/dL   Calcium 8.3 (L) 8.9 - 10.3 mg/dL   GFR, Estimated 24 (L) >60 mL/min    Comment: (NOTE) Calculated using the CKD-EPI Creatinine Equation (2021)    Anion gap 8 5 - 15    Comment: Performed at [facility], [address], [place], [place] [address]  CBC     Status: None   Collection Time: 02/25/23  1:25 PM  Result Value Ref  Range   WBC 6.8 4.0 - 10.5 K/uL   RBC 4.35 4.22 - 5.81 MIL/uL   Hemoglobin 13.0 13.0 - 17.0 g/dL   HCT 54.0 98.1 - 19.1 %   MCV 92.4 80.0 - 100.0 fL   MCH 29.9 26.0 - 34.0 pg   MCHC 32.3 30.0 - 36.0 g/dL   RDW 47.8 29.5 - 62.1 %   Platelets 282 150 - 400 K/uL   nRBC 0.0 0.0 - 0.2 %    Comment: Performed at [facility], [address], [place], [place] [address]  Troponin I (High Sensitivity)     Status: None   Collection Time: 02/25/23  1:25 PM  Result Value Ref Range   Troponin I (High Sensitivity) 14 <18 ng/L    Comment: (NOTE) Elevated high sensitivity troponin I (hsTnI) values and significant  changes across serial measurements may suggest ACS but many other  chronic and acute conditions are known to elevate hsTnI results.  Refer to the "Links" section for chest pain algorithms and additional  guidance. Performed at [facility], [address] [place], [place] [address]   Brain natriuretic peptide     Status: Abnormal   Collection Time: 02/25/23  1:25 PM  Result Value Ref Range   B Natriuretic Peptide 3,916.7 (H) 0.0 - 100.0 pg/mL    Comment: Performed at [facility], [address], [place], [place] [address]  Hepatic function panel     Status: Abnormal   Collection Time: 02/25/23  3:55 PM  Result Value Ref Range   Total Protein 5.2 (L) 6.5 - 8.1 g/dL   Albumin <8.4 (L) 3.5 - 5.0 g/dL   AST 15 15 - 41 U/L   ALT 11 0 - 44 U/L   Alkaline Phosphatase 71 38 - 126 U/L   Total Bilirubin 0.4 0.0 - 1.2 mg/dL   Bilirubin, Direct <1.3 0.0 - 0.2 mg/dL   Indirect Bilirubin NOT CALCULATED 0.3 - 0.9 mg/dL    Comment: Performed at [facility], [address], [place], [place] [address]  Troponin I (High Sensitivity)     Status: None   Collection Time: 02/25/23  3:55 PM  Result Value Ref Range   Troponin I (High Sensitivity) 15 <18 ng/L    Comment: (NOTE) Elevated high sensitivity troponin I (hsTnI) values and significant  changes across serial  measurements may suggest ACS but many other  chronic and acute conditions are known to elevate hsTnI results.  Refer to the "Links" section for chest pain algorithms and additional  guidance. Performed at [facility], [address], [place], [place] [address]   Hemoglobin A1c     Status: Abnormal   Collection Time: 02/25/23  5:27 PM  Result Value Ref Range   Hgb A1c MFr Bld 11.6 (H) 4.8 - 5.6 %    Comment: (NOTE) Pre diabetes:          5.7%-6.4%  Diabetes:              >6.4%  Glycemic control for   <7.0% adults with diabetes    Mean Plasma Glucose 286.22 mg/dL    Comment: Performed at [facility], [address], [place], [place] [address]  CBG monitoring, ED     Status: Abnormal   Collection Time: 02/25/23  5:41 PM  Result Value Ref Range   Glucose-Capillary 293 (H) 70 - 99 mg/dL    Comment: Glucose reference range applies only to samples taken after fasting for at least 8 hours.  CBG monitoring, ED     Status: Abnormal   Collection Time: 02/25/23  9:02 PM  Result Value Ref Range   Glucose-Capillary 363 (H) 70 - 99 mg/dL    Comment: Glucose reference range applies only to samples taken after fasting for at least 8 hours.   DG Chest Left Decubitus Result Date: 02/25/2023 CLINICAL DATA:  Pleural effusion, short of breath EXAM: CHEST - LEFT DECUBITUS COMPARISON:  02/25/2023 FINDINGS: Left lateral decubitus radiograph of the chest demonstrates moderate left pleural effusion, which is partially free-flowing. Small right pleural effusion. No pneumothorax. IMPRESSION: 1. Moderate left pleural effusion, which is at least partially free-flowing. 2. Small free-flowing right pleural effusion. Electronically Signed   By: Sharlet Salina M.D.   On: 02/25/2023 18:22   DG Chest 2 View Result Date: 02/25/2023 CLINICAL DATA:  shortness of breath for 1 day. History of diabetes, hypertension. Ex-smoker. EXAM: CHEST - 2 VIEW COMPARISON:  12/30/2022 FINDINGS: Median sternotomy for CABG.  Atherosclerosis in the transverse aorta. Numerous leads and wires project over the chest. Midline trachea. Moderate cardiomegaly. A moderate left pleural effusion with possible mild loculation laterally. Tiny right pleural effusion. No pneumothorax. Interstitial edema is moderate, increased. Left-greater-than-right base airspace disease. IMPRESSION: Moderate congestive heart failure. Moderate to large left pleural effusion with possible mild loculation. Small right pleural effusion Left-greater-than-right base airspace disease, most likely atelectasis. At the left lung base, infection cannot be excluded. Aortic Atherosclerosis (ICD10-I70.0). Electronically Signed   By: Jeronimo Greaves M.D.   On: 02/25/2023 15:32    PMH:   Past Medical History:  Diagnosis Date   Acute hypoxic respiratory failure (HCC) 12/16/2021   Acute pulmonary edema (HCC)    Arrhythmia    CAD (coronary artery disease)    a. s/p stent x 2; b. 08/2020 CABG x 3 (LIMA->LAD, VG->PDA, VG->OM1).   Carotid artery occlusion    Chronic combined systolic and diastolic heart failure (HCC)    Chronic HFrEF (heart failure with  reduced ejection fraction) (HCC)    a. 11/2022 Echo: EF 25-30%, glob HK, GrI DD, nl RV fxn, mod dil LA, mild-mod MR.   Diabetes mellitus    Dyslipidemia    Essential hypertension    Ischemic cardiomyopathy    a. 11/2022 Echo: EF 25-30%.   Left upper quadrant abdominal pain 05/30/2022   NSTEMI (non-ST elevated myocardial infarction) [facility])     PSH:   Past Surgical History:  Procedure Laterality Date   CARDIAC CATHETERIZATION     01/2010 ; 04/2010   CORONARY ARTERY BYPASS GRAFT N/A 08/23/2020   Procedure: CORONARY ARTERY BYPASS GRAFTING (CABG) x 3 ON CARDIOPULMONARY BYPASS USING EVH AND PARTIALLY OPEN HARVESTED LEFT GREATER SAPHENOUS VEIN AND LEFT INTERNAL MAMMARY ARTERY.  LIMA TO LAD, SVG TO OM, SVG TO PDA;  Surgeon: Corliss Skains, MD;  Location: [facility] OR;  Service: Open Heart Surgery;  Laterality: N/A;   RIGHT  HEART CATH N/A 12/19/2021   Procedure: RIGHT HEART CATH;  Surgeon: [name], MD;  Location: [facility] INVASIVE CV LAB;  Service: Cardiovascular;  Laterality: N/A;   RIGHT/LEFT HEART CATH AND CORONARY ANGIOGRAPHY N/A 08/22/2020   Procedure: RIGHT/LEFT HEART CATH AND CORONARY ANGIOGRAPHY;  Surgeon: [name], MD;  Location: [facility] INVASIVE CV LAB;  Service: Cardiovascular;  Laterality: N/A;   TEE WITHOUT CARDIOVERSION N/A 08/23/2020   Procedure: TRANSESOPHAGEAL ECHOCARDIOGRAM (TEE);  Surgeon: Corliss Skains, MD;  Location: [facility] OR;  Service: Open Heart Surgery;  Laterality: N/A;   TEMPORARY PACEMAKER N/A 08/22/2020   Procedure: TEMPORARY PACEMAKER;  Surgeon: [name], MD;  Location: [facility] INVASIVE CV LAB;  Service: Cardiovascular;  Laterality: N/A;   VEIN HARVEST Left 08/23/2020   Procedure: ENDOVEIN HARVEST AND PARTIALLY OPEN GREATER SAPHENOUS VEIN HARVESTING;  Surgeon: Corliss Skains, MD;  Location: [facility] OR;  Service: Open Heart Surgery;  Laterality: Left;    Allergies: No Known Allergies  Medications:   Prior to Admission medications   Medication Sig Start Date End Date Taking? Authorizing Provider  atorvastatin (LIPITOR) 80 MG tablet Take 1 tablet (80 mg total) by mouth daily. 11/28/22   Zannie Cove, MD  carvedilol (COREG) 6.25 MG tablet Take 1 tablet (6.25 mg total) by mouth 2 (two) times daily with a meal. 01/04/23 02/25/23  Noralee Stain, DO  clopidogrel (PLAVIX) 75 MG tablet Take 1 tablet (75 mg total) by mouth daily. 01/05/23   Noralee Stain, DO  Continuous Glucose Receiver (FREESTYLE LIBRE 3 READER) DEVI Use as directed! 02/06/22   Reather Littler, MD  Continuous Glucose Sensor (FREESTYLE LIBRE 3 SENSOR) MISC Use as instructed change every 14 days 08/01/22   Altamese Tustin, MD  furosemide (LASIX) 40 MG tablet Take 1 tablet (40 mg total) by mouth daily. 11/29/22   Zannie Cove, MD  hydrALAZINE (APRESOLINE) 25 MG tablet Take 1 tablet (25 mg total) by mouth every  8 (eight) hours. 01/04/23 02/25/23  Noralee Stain, DO  insulin glargine (LANTUS) 100 UNIT/ML Solostar Pen Inject 15 Units into the skin at bedtime. 12/08/22   Arrien, York Ram, MD  insulin lispro (ADMELOG SOLOSTAR) 100 UNIT/ML KwikPen Inject 3 Units into the skin 3 (three) times daily with meals. 12/08/22   Arrien, York Ram, MD  Insulin Pen Needle (PEN NEEDLES 3/16") 31G X 5 MM MISC Uses three times daily with insulin. 05/24/22   Shamleffer, Konrad Dolores, MD  Insulin Pen Needle 32G X 4 MM MISC Use as directed with Humalog and Lantus pens. 12/08/22   Arrien, York Ram, MD  isosorbide mononitrate (IMDUR) 60 MG 24 hr tablet Take 1 tablet (60 mg total) by mouth daily. 11/28/22 11/23/23  Zannie Cove, MD  levothyroxine (SYNTHROID) 25 MCG tablet Take 1 tablet (25 mcg total) by mouth daily at 6 (six) AM. 12/08/22 02/25/23  Arrien, York Ram, MD  nitroGLYCERIN (NITROSTAT) 0.4 MG SL tablet Place 1 tab (0.4mg  total) under the tongue every 5 minutes as needed for chest pain 02/14/22   Patwardhan, Anabel Bene, MD    Discontinued Meds:   Medications Discontinued During This Encounter  Medication Reason   furosemide (LASIX) injection 40 mg    isosorbide mononitrate (IMDUR) 24 hr tablet 60 mg    hydrALAZINE (APRESOLINE) tablet 25 mg    furosemide (LASIX) injection 60 mg    levothyroxine (SYNTHROID) tablet 25 mcg     Social History:  reports that he quit smoking about 2 years ago. His smoking use included cigarettes. He started smoking about 52 years ago. He has a 12.5 pack-year smoking history. He has never used smokeless tobacco. He reports that he does not drink alcohol and does not use drugs.  Family History:   Family History  Problem Relation Age of Onset   Diabetes Neg Hx     Blood pressure 135/72, pulse 66, temperature 98.5 F (36.9 C), temperature source Oral, resp. rate (!) 21, height 5\' 2"  (1.575 m), weight 60 kg, SpO2 100%. General appearance: alert, cooperative, and  appears stated age Head: Normocephalic, without obvious abnormality, atraumatic Eyes: negative Neck: no adenopathy, no carotid bruit, supple, symmetrical, trachea midline, and thyroid not enlarged, symmetric, no tenderness/mass/nodules Back: symmetric, no curvature. ROM normal. No CVA tenderness. Resp: clear to auscultation bilaterally Cardio: regular rate and rhythm GI: soft, non-tender; bowel sounds normal; no masses,  no organomegaly Extremities: edema 1+ Pulses: 2+ and symmetric Skin: Skin color, texture, turgor normal. No rashes or lesions       Penn Grissett, Len Blalock, MD 02/26/2023, 7:15 AM

## 2023-02-26 NOTE — ED Notes (Signed)
MD at bedside. 

## 2023-02-26 NOTE — Inpatient Diabetes Management (Signed)
Inpatient Diabetes Program Recommendations  AACE/ADA: New Consensus Statement on Inpatient Glycemic Control (2015)  Target Ranges:  Prepandial:   less than 140 mg/dL      Peak postprandial:   less than 180 mg/dL (1-2 hours)      Critically ill patients:  140 - 180 mg/dL   Lab Results  Component Value Date   GLUCAP 87 02/26/2023   HGBA1C 11.6 (H) 02/25/2023    Review of Glycemic Control  Latest Reference Range & Units 02/26/23 10:39 02/26/23 11:48  Glucose-Capillary 70 - 99 mg/dL 41 (LL) 87  (LL): Data is critically low Diabetes history: Type 2 DM Outpatient Diabetes medications: Lantus 15 units at bedtime, Ademelog 3 units TID Current orders for Inpatient glycemic control: Novolog 0-9 units TID, Novolog 3 units TID, Semglee 15 units QHS  Inpatient Diabetes Program Recommendations:    Noted hypoglycemia this AM of 41 mg/dL following Semglee and Novolog. Novolog was given two hours after the previous CBG.   Given renal status, consider: -reducing Semglee to 12 units at bedtime -reducing correction to Novolog 0-6 units TID   Thanks, Lujean Rave, MSN, RNC-OB Diabetes Coordinator [phone] (8a-5p)

## 2023-02-26 NOTE — Progress Notes (Addendum)
PROGRESS NOTE    Colin [name]  ZOX:[id number] DOB: 1950-09-21 DOA: 02/25/2023 PCP: Grayce Sessions, NP  72/M w CAD/CABG, chronic combined CHF, EF 25-30%, type 2 diabetes, CKD 4, hypertension presented to the ED with shortness of breath and swelling.  Patient's son reports that he ran out of meds few weeks ago and has been unable to obtain refills. -In the ER hypertensive, labs with potassium 3.2, blood glucose 311, creatinine 2.7, BNP 3916, troponin normal, chest x-ray with significant vascular congestion  Subjective: Feels a little better  Assessment and Plan:  Acute on chronic combined systolic and diastolic CHF Pleural effusions left> right -Last echo 10/24 with EF 25-30%, grade 1 DD, normal RV -Out of meds X 2 weeks -Continue IV Lasix 80 Mg twice daily, BiDil, add Unna boots -GDMT limited by CKD 4, labs still pending in the ER -Cards consulting -Discussed with son, will request thoracentesis today  Severe hypoalbuminemia -Albumin is less than 1.5, no history of liver disease, need to rule out nephrotic syndrome & cirrhosis -Check urinalysis, urine protein/creatinine, liver ultrasound -Transition to torsemide at discharge  CKD 4 -Baseline creatinine 2.7-3, relatively stable, monitor with diuresis  CAD/CABG -Stable, continue Plavix, Lipitor, bidil  Type 2 diabetes mellitus, uncontrolled with hyperglycemia A1c is 11.6 -He has been out of insulin recently as well -Restarted on Semglee, add meal coverage NovoLog  Hypothyroidism -Likely due to being out of Synthroid, resumed Synthroid 25 mcg  DVT prophylaxis: Lovenox Code Status: Full code Family Communication: Son son at bedside Disposition Plan: Home likely 2 to 3 days  Consultants:    Procedures:   Antimicrobials:    Objective: Vitals:   02/26/23 0500 02/26/23 0600 02/26/23 0628 02/26/23 1050  BP: (!) 142/73 135/72  (!) 183/80  Pulse: 65 66  63  Resp: 18 (!) 21  20  Temp:   98.5 F (36.9 C)    TempSrc:   Oral   SpO2: 100% 100%  100%  Weight:      Height:        Intake/Output Summary (Last 24 hours) at 02/26/2023 1130 Last data filed at 02/26/2023 1049 Gross per 24 hour  Intake 320 ml  Output 2275 ml  Net -1955 ml   Filed Weights   02/25/23 1322  Weight: 60 kg    Examination:  General exam: Appears calm and comfortable  HEENT: Positive JVD CVS: S1-S2, regular rhythm Lungs: Decreased breath sounds on the left Abdomen: Soft, nontender, bowel sounds present He is: 2-3+ edema Skin: No rashes Psychiatry:  Mood & affect appropriate.     Data Reviewed:   CBC: Recent Labs  Lab 02/25/23 1325  WBC 6.8  HGB 13.0  HCT 40.2  MCV 92.4  PLT 282   Basic Metabolic Panel: Recent Labs  Lab 02/25/23 1325  NA 138  K 3.2*  CL 105  CO2 25  GLUCOSE 311*  BUN 18  CREATININE 2.76*  CALCIUM 8.3*   GFR: Estimated Creatinine Clearance: 18.7 mL/min (A) (by C-G formula based on SCr of 2.76 mg/dL (H)). Liver Function Tests: Recent Labs  Lab 02/25/23 1555  AST 15  ALT 11  ALKPHOS 71  BILITOT 0.4  PROT 5.2*  ALBUMIN <1.5*   No results for input(s): "LIPASE", "AMYLASE" in the last 168 hours. No results for input(s): "AMMONIA" in the last 168 hours. Coagulation Profile: No results for input(s): "INR", "PROTIME" in the last 168 hours. Cardiac Enzymes: No results for input(s): "CKTOTAL", "CKMB", "CKMBINDEX", "TROPONINI" in the last 168  hours. BNP (last 3 results) No results for input(s): "PROBNP" in the last 8760 hours. HbA1C: Recent Labs    02/25/23 1727  HGBA1C 11.6*   CBG: Recent Labs  Lab 02/25/23 1741 02/25/23 2102 02/26/23 1039  GLUCAP 293* 363* 41*   Lipid Profile: No results for input(s): "CHOL", "HDL", "LDLCALC", "TRIG", "CHOLHDL", "LDLDIRECT" in the last 72 hours. Thyroid Function Tests: No results for input(s): "TSH", "T4TOTAL", "FREET4", "T3FREE", "THYROIDAB" in the last 72 hours. Anemia Panel: No results for input(s): "VITAMINB12",  "FOLATE", "FERRITIN", "TIBC", "IRON", "RETICCTPCT" in the last 72 hours. Urine analysis:    Component Value Date/Time   COLORURINE YELLOW 01/22/2022 2146   APPEARANCEUR CLEAR 01/22/2022 2146   LABSPEC 1.021 01/22/2022 2146   PHURINE 7.0 01/22/2022 2146   GLUCOSEU >=500 (A) 01/22/2022 2146   HGBUR NEGATIVE 01/22/2022 2146   BILIRUBINUR NEGATIVE 01/22/2022 2146   BILIRUBINUR negative 05/06/2020 1113   KETONESUR NEGATIVE 01/22/2022 2146   PROTEINUR >=300 (A) 01/22/2022 2146   UROBILINOGEN 0.2 05/06/2020 1113   UROBILINOGEN 0.2 01/21/2010 1330   NITRITE NEGATIVE 01/22/2022 2146   LEUKOCYTESUR NEGATIVE 01/22/2022 2146   Sepsis Labs: @LABRCNTIP (procalcitonin:4,lacticidven:4)  )No results found for this or any previous visit (from the past 240 hours).   Radiology Studies: US RENAL Result Date: 02/26/2023 CLINICAL DATA:  Chronic kidney disease. EXAM: RENAL / URINARY TRACT ULTRASOUND COMPLETE COMPARISON:  None Available. FINDINGS: Right Kidney: Renal measurements: 10.1 x 4.5 x 4.2 cm = volume: 102 mL. Small cysts measure up to 1.9 cm. Normal echogenicity. No hydronephrosis or shadowing stone. Left Kidney: Renal measurements: 10.0 x 4.5 x 4.9 cm = volume: 115 mL. Several cysts measure up to 4.5 cm. Normal echogenicity. No hydronephrosis or shadowing stone. Bladder: Appears normal for degree of bladder distention. Other: Moderate bilateral pleural effusions are partially visualized. IMPRESSION: 1. Small bilateral renal cysts. No hydronephrosis or shadowing stone. 2. Bilateral pleural effusions. Electronically Signed   By: Elgie Collard M.D.   On: 02/26/2023 09:45   DG Chest Left Decubitus Result Date: 02/25/2023 CLINICAL DATA:  Pleural effusion, short of breath EXAM: CHEST - LEFT DECUBITUS COMPARISON:  02/25/2023 FINDINGS: Left lateral decubitus radiograph of the chest demonstrates moderate left pleural effusion, which is partially free-flowing. Small right pleural effusion. No pneumothorax.  IMPRESSION: 1. Moderate left pleural effusion, which is at least partially free-flowing. 2. Small free-flowing right pleural effusion. Electronically Signed   By: Sharlet Salina M.D.   On: 02/25/2023 18:22   DG Chest 2 View Result Date: 02/25/2023 CLINICAL DATA:  shortness of breath for 1 day. History of diabetes, hypertension. Ex-smoker. EXAM: CHEST - 2 VIEW COMPARISON:  12/30/2022 FINDINGS: Median sternotomy for CABG. Atherosclerosis in the transverse aorta. Numerous leads and wires project over the chest. Midline trachea. Moderate cardiomegaly. A moderate left pleural effusion with possible mild loculation laterally. Tiny right pleural effusion. No pneumothorax. Interstitial edema is moderate, increased. Left-greater-than-right base airspace disease. IMPRESSION: Moderate congestive heart failure. Moderate to large left pleural effusion with possible mild loculation. Small right pleural effusion Left-greater-than-right base airspace disease, most likely atelectasis. At the left lung base, infection cannot be excluded. Aortic Atherosclerosis (ICD10-I70.0). Electronically Signed   By: Jeronimo Greaves M.D.   On: 02/25/2023 15:32     Scheduled Meds:  atorvastatin  80 mg Oral Daily   clopidogrel  75 mg Oral Daily   enoxaparin (LOVENOX) injection  30 mg Subcutaneous Q24H   furosemide  80 mg Intravenous BID   insulin aspart  0-9 Units Subcutaneous TID WC  insulin glargine-yfgn  15 Units Subcutaneous QHS   isosorbide-hydrALAZINE  1 tablet Oral TID   levothyroxine  25 mcg Oral Q0600   sodium chloride flush  3 mL Intravenous Q12H   Continuous Infusions:  sodium chloride       LOS: 1 day    Time spent:    Zannie Cove, MD [facility]   02/26/2023, 11:30 AM

## 2023-02-26 NOTE — ED Notes (Signed)
Checked patient cbg it was 43 notified rn OF BLOOD SUGAR

## 2023-02-26 NOTE — ED Notes (Signed)
Pt alert, eating lunch 

## 2023-02-26 NOTE — ED Notes (Signed)
Pt cbg 41.  MD notified.  Pt given juice.  Remains ao x 4.

## 2023-02-26 NOTE — Progress Notes (Signed)
Cardiologist:  Colin [name] CHF:  Colin [name]  Subjective:   Breathing less labored  Good diuresis   Objective:  Vitals:   02/26/23 0400 02/26/23 0500 02/26/23 0600 02/26/23 0628  BP: 115/73 (!) 142/73 135/72   Pulse: 65 65 66   Resp: 17 18 (!) 21   Temp:    98.5 F (36.9 C)  TempSrc:    Oral  SpO2: 100% 100% 100%   Weight:      Height:        Intake/Output from previous day:  Intake/Output Summary (Last 24 hours) at 02/26/2023 0813 Last data filed at 02/26/2023 0415 Gross per 24 hour  Intake 320 ml  Output 1475 ml  Net -1155 ml    Physical Exam: Less tachypnea Decreased BS left base bilateral rales JVP elevated No murmur Plus 2 LE edema  Lab Results: Basic Metabolic Panel: Recent Labs    02/25/23 1325  NA 138  K 3.2*  CL 105  CO2 25  GLUCOSE 311*  BUN 18  CREATININE 2.76*  CALCIUM 8.3*   Liver Function Tests: Recent Labs    02/25/23 1555  AST 15  ALT 11  ALKPHOS 71  BILITOT 0.4  PROT 5.2*  ALBUMIN <1.5*    CBC: Recent Labs    02/25/23 1325  WBC 6.8  HGB 13.0  HCT 40.2  MCV 92.4  PLT 282    Hemoglobin A1C: Recent Labs    02/25/23 1727  HGBA1C 11.6*     Imaging: DG Chest Left Decubitus Result Date: 02/25/2023 CLINICAL DATA:  Pleural effusion, short of breath EXAM: CHEST - LEFT DECUBITUS COMPARISON:  02/25/2023 FINDINGS: Left lateral decubitus radiograph of the chest demonstrates moderate left pleural effusion, which is partially free-flowing. Small right pleural effusion. No pneumothorax. IMPRESSION: 1. Moderate left pleural effusion, which is at least partially free-flowing. 2. Small free-flowing right pleural effusion. Electronically Signed   By: Colin [name] M.D.   On: 02/25/2023 18:22   DG Chest 2 View Result Date: 02/25/2023 CLINICAL DATA:  shortness of breath for 1 day. History of diabetes, hypertension. Ex-smoker. EXAM: CHEST - 2 VIEW COMPARISON:  12/30/2022 FINDINGS: Median sternotomy for CABG. Atherosclerosis in the  transverse aorta. Numerous leads and wires project over the chest. Midline trachea. Moderate cardiomegaly. A moderate left pleural effusion with possible mild loculation laterally. Tiny right pleural effusion. No pneumothorax. Interstitial edema is moderate, increased. Left-greater-than-right base airspace disease. IMPRESSION: Moderate congestive heart failure. Moderate to large left pleural effusion with possible mild loculation. Small right pleural effusion Left-greater-than-right base airspace disease, most likely atelectasis. At the left lung base, infection cannot be excluded. Aortic Atherosclerosis (ICD10-I70.0). Electronically Signed   By: Colin [name] M.D.   On: 02/25/2023 15:32    Cardiac Studies:  ECG: SR LAD poor R wave progression    Telemetry:  NSR   Echo: EF 25-30% 11/2022  Medications:    atorvastatin  80 mg Oral Daily   clopidogrel  75 mg Oral Daily   enoxaparin (LOVENOX) injection  30 mg Subcutaneous Q24H   furosemide  80 mg Intravenous BID   insulin aspart  0-9 Units Subcutaneous TID WC   insulin glargine-yfgn  15 Units Subcutaneous QHS   isosorbide-hydrALAZINE  1 tablet Oral TID   levothyroxine  25 mcg Oral Q0600   sodium chloride flush  3 mL Intravenous Q12H      sodium chloride      Assessment/Plan:  Colin [name] is a 73 y.o. male with a hx of HFrEF,  CAD s/p CABG x 3, HTN, HLD, T2DM, CKD stage 4, COPD who is being seen 02/25/2023 for the evaluation of acute on chronic systolic heart failure at the request of Colin [name]. Patient non compliant with meds last 2 weeks  CHF:  known ischemic DCM. Non compliant with meds. Admitted with gross volume overload and bilateral effusions L> R Good diuresis with iv lasix 80 mg bid. Needs IR left thoracentesis to facilitate re expansion of left lung Lateral decubitus shows free flowing fluid. Changed to bidil for ease of compliance and better BP control  CRF:  per nephrology CR 2.76 at baseline follow with diuresis CAD/CABG:  no  agnina troponin negative no acute ECG changes  DM:  BS > 300 on admission per primary service Thyroid: TSH pending Free T4 normal 12/08/22  Colin [name] 02/26/2023, 8:13 AM

## 2023-02-26 NOTE — ED Notes (Signed)
Pt to US.

## 2023-02-26 NOTE — ED Notes (Addendum)
Pt resting with eyes closed; respirations spontaneous, even, unlabored 

## 2023-02-27 ENCOUNTER — Inpatient Hospital Stay (HOSPITAL_COMMUNITY): Payer: 59

## 2023-02-27 DIAGNOSIS — E039 Hypothyroidism, unspecified: Secondary | ICD-10-CM

## 2023-02-27 DIAGNOSIS — E1169 Type 2 diabetes mellitus with other specified complication: Secondary | ICD-10-CM

## 2023-02-27 DIAGNOSIS — I5023 Acute on chronic systolic (congestive) heart failure: Secondary | ICD-10-CM | POA: Diagnosis not present

## 2023-02-27 DIAGNOSIS — N184 Chronic kidney disease, stage 4 (severe): Secondary | ICD-10-CM | POA: Diagnosis not present

## 2023-02-27 DIAGNOSIS — E785 Hyperlipidemia, unspecified: Secondary | ICD-10-CM

## 2023-02-27 DIAGNOSIS — I10 Essential (primary) hypertension: Secondary | ICD-10-CM

## 2023-02-27 DIAGNOSIS — I5043 Acute on chronic combined systolic (congestive) and diastolic (congestive) heart failure: Secondary | ICD-10-CM | POA: Diagnosis not present

## 2023-02-27 DIAGNOSIS — E8809 Other disorders of plasma-protein metabolism, not elsewhere classified: Secondary | ICD-10-CM | POA: Insufficient documentation

## 2023-02-27 DIAGNOSIS — I2510 Atherosclerotic heart disease of native coronary artery without angina pectoris: Secondary | ICD-10-CM | POA: Diagnosis not present

## 2023-02-27 HISTORY — PX: IR THORACENTESIS ASP PLEURAL SPACE W/IMG GUIDE: IMG5380

## 2023-02-27 LAB — PROTEIN, PLEURAL OR PERITONEAL FLUID: Total protein, fluid: <3 g/dL

## 2023-02-27 LAB — BASIC METABOLIC PANEL
Anion gap: 6 (ref 5–15)
BUN: 25 mg/dL — ABNORMAL HIGH (ref 8–23)
CO2: 31 mmol/L (ref 22–32)
Calcium: 8.1 mg/dL — ABNORMAL LOW (ref 8.9–10.3)
Chloride: 104 mmol/L (ref 98–111)
Creatinine, Ser: 2.8 mg/dL — ABNORMAL HIGH (ref 0.61–1.24)
GFR, Estimated: 23 mL/min — ABNORMAL LOW (ref >60)
Glucose, Bld: 155 mg/dL — ABNORMAL HIGH (ref 70–99)
Potassium: 3.2 mmol/L — ABNORMAL LOW (ref 3.5–5.1)
Sodium: 141 mmol/L (ref 135–145)

## 2023-02-27 LAB — BODY FLUID CELL COUNT WITH DIFFERENTIAL
Eos, Fluid: 1 %
Lymphs, Fluid: 68 %
Monocyte-Macrophage-Serous Fluid: 21 % — ABNORMAL LOW (ref 50–90)
Neutrophil Count, Fluid: 9 % (ref 0–25)
Other Cells, Fluid: 1 %
Total Nucleated Cell Count, Fluid: 124 uL (ref 0–1000)

## 2023-02-27 LAB — PROTIME-INR
INR: 1 (ref 0.8–1.2)
Prothrombin Time: 13.8 s (ref 11.4–15.2)

## 2023-02-27 LAB — GRAM STAIN: Gram Stain: NONE SEEN

## 2023-02-27 LAB — GLUCOSE, CAPILLARY
Glucose-Capillary: 68 mg/dL — ABNORMAL LOW (ref 70–99)
Glucose-Capillary: 89 mg/dL (ref 70–99)
Glucose-Capillary: 190 mg/dL — ABNORMAL HIGH (ref 70–99)
Glucose-Capillary: 142 mg/dL — ABNORMAL HIGH (ref 70–99)
Glucose-Capillary: 186 mg/dL — ABNORMAL HIGH (ref 70–99)

## 2023-02-27 LAB — LACTATE DEHYDROGENASE, PLEURAL OR PERITONEAL FLUID: LD, Fluid: 34 U/L — ABNORMAL HIGH (ref 3–23)

## 2023-02-27 MED ORDER — POTASSIUM CHLORIDE CRYS ER 20 MEQ PO TBCR
40.0000 meq | EXTENDED_RELEASE_TABLET | ORAL | Status: AC
  Administered 2023-02-27 (×2): 40 meq via ORAL
  Filled 2023-02-27 (×2): qty 2

## 2023-02-27 MED ORDER — INSULIN ASPART 100 UNIT/ML IJ SOLN
0.0000 [IU] | Freq: Three times a day (TID) | INTRAMUSCULAR | Status: DC
  Administered 2023-02-27: 1 [IU] via SUBCUTANEOUS
  Administered 2023-02-28: 4 [IU] via SUBCUTANEOUS
  Administered 2023-02-28: 3 [IU] via SUBCUTANEOUS
  Administered 2023-03-01 (×2): 2 [IU] via SUBCUTANEOUS
  Administered 2023-03-02: 3 [IU] via SUBCUTANEOUS

## 2023-02-27 MED ORDER — INSULIN GLARGINE-YFGN 100 UNIT/ML ~~LOC~~ SOLN
12.0000 [IU] | Freq: Every day | SUBCUTANEOUS | Status: DC
  Administered 2023-02-27: 12 [IU] via SUBCUTANEOUS
  Filled 2023-02-27 (×2): qty 0.12

## 2023-02-27 MED ORDER — LIDOCAINE HCL 1 % IJ SOLN
INTRAMUSCULAR | Status: AC
  Filled 2023-02-27: qty 20

## 2023-02-27 MED ORDER — LIDOCAINE HCL 1 % IJ SOLN
10.0000 mL | Freq: Once | INTRAMUSCULAR | Status: AC
  Administered 2023-02-27: 10 mL via INTRADERMAL

## 2023-02-27 NOTE — Progress Notes (Signed)
  Progress Note   Patient: Colin [name] WUX:[id number] DOB: 12-08-1950 DOA: 02/25/2023     2 DOS: the patient was seen and examined on 02/27/2023   Brief hospital course: 72/M w CAD/CABG, chronic combined CHF, EF 25-30%, type 2 diabetes, CKD 4, hypertension presented to the ED with shortness of breath and swelling.  Patient's son reports that he ran out of meds few weeks ago and has been unable to obtain refills. -In the ER hypertensive, labs with potassium 3.2, blood glucose 311, creatinine 2.7, BNP 3916, troponin normal, chest x-ray with significant vascular congestion  Assessment and Plan: * Acute on chronic systolic CHF (congestive heart failure) (HCC) Echocardiogram with reduced LV systolic function with EF 30 to 35%, global hypokinesis, LV internal cavity size is dilatated. RV systolic function with moderate reduction, LA with moderate dilatation, moderate to severe mitral regurgitation,   Urine output is 3,300 ml Systolic blood pressure 150 mmHg range.  Today had one infusion of IV albumin 25 g  Plan to continue diuresis with furosemide 80 mg IV bid.  After load reduction with hydralazine and isosorbide.   Cardiogenic pulmonary edema with bilateral pleural effusions,   CKD (chronic kidney disease) stage 4, GFR 15-29 ml/min (HCC) Hypokalemia, Proteinuria and hypoalbuminemia   Renal function with serum cr at 2,8 with K at 3,2 and serum bicarbonate at 31.  Na 141   Plan to continue K correction with Kcl and check renal function and electrolytes in am.  Continue aggressive diuresis with furosemide IV .   Type 2 diabetes mellitus with hyperlipidemia (HCC) Hypoglycemia.   Plan to decrease basal insulin, discontinue pre meal insulin scheduled doses and decrease dose of insulin sliding scale.  Continue close monitoring.   Coronary artery disease SP CABG  No acute coronary syndrome.   Essential hypertension Continue blood pressure control with Bidil.   Hypothyroidism Continue  levothyroxine.         Subjective: Patient with no chest pain, dyspnea and edema are improving but not back to baseline,   Physical Exam: Vitals:   02/27/23 0829 02/27/23 0908 02/27/23 1129 02/27/23 1608  BP:  (!) 151/77 (!) 145/68 (!) 157/70  Pulse:  68 69 72  Resp:  19 17 20   Temp:  97.9 F (36.6 C) 97.7 F (36.5 C) 97.6 F (36.4 C)  TempSrc:  Oral Oral Oral  SpO2: 95% 91% 97% 96%  Weight:      Height:       Neurology awake and alert ENT with mild pallor Cardiovascular with S1 and S2 present and regular, no gallops, positive systolic murmur at the apex moderate JVD Positive lower edema ++ pitting  Respiratory with rales bilaterally with no wheezing or rhonchi Abdomen with no distention   Data Reviewed:    Family Communication: no family at the bedside   Disposition: Status is: Inpatient Remains inpatient appropriate because: IV diuresis   Planned Discharge Destination: Home      Author: Coralie Keens, MD 02/27/2023 5:03 PM  For on call review [email or web address].

## 2023-02-27 NOTE — Plan of Care (Signed)
  Problem: Coping: Goal: Ability to adjust to condition or change in health will improve Outcome: Progressing   Problem: Fluid Volume: Goal: Ability to maintain a balanced intake and output will improve Outcome: Progressing   Problem: Health Behavior/Discharge Planning: Goal: Ability to identify and utilize available resources and services will improve Outcome: Progressing   

## 2023-02-27 NOTE — Progress Notes (Signed)
Cardiologist:  Rosemary Holms CHF:  Colin [name]  Subjective:   Continues to diurese  For IR thoracentesis this am   Objective:  Vitals:   02/27/23 0421 02/27/23 0422 02/27/23 0539 02/27/23 0742  BP:  (!) 152/74  (!) 157/81  Pulse:  66    Resp:  18  18  Temp: 97.6 F (36.4 C) 97.6 F (36.4 C)  97.7 F (36.5 C)  TempSrc:  Oral  Oral  SpO2:  94%  97%  Weight:   63.7 kg   Height:        Intake/Output from previous day:  Intake/Output Summary (Last 24 hours) at 02/27/2023 0803 Last data filed at 02/27/2023 0740 Gross per 24 hour  Intake 240 ml  Output 3500 ml  Net -3260 ml    Physical Exam: Less tachypnea Decreased BS left base bilateral rales JVP elevated No murmur Plus 2 LE edema  Lab Results: Basic Metabolic Panel: Recent Labs    02/26/23 1725 02/27/23 0233  NA 143 141  K 4.0 3.2*  CL 104 104  CO2 29 31  GLUCOSE 242* 155*  BUN 26* 25*  CREATININE 2.78* 2.80*  CALCIUM 8.2* 8.1*  MG 1.9  --    Liver Function Tests: Recent Labs    02/25/23 1555  AST 15  ALT 11  ALKPHOS 71  BILITOT 0.4  PROT 5.2*  ALBUMIN <1.5*    CBC: Recent Labs    02/25/23 1325  WBC 6.8  HGB 13.0  HCT 40.2  MCV 92.4  PLT 282    Hemoglobin A1C: Recent Labs    02/25/23 1727  HGBA1C 11.6*     Imaging: US Abdomen Limited RUQ (LIVER/GB) Result Date: 02/26/2023 CLINICAL DATA:  Liver disease. EXAM: ULTRASOUND ABDOMEN LIMITED RIGHT UPPER QUADRANT COMPARISON:  CT 11/23/2022. FINDINGS: Gallbladder: Gallbladder is relatively contracted. There is some wall thickening up to 4 mm but this could relate to level of underdistention. No obvious shadowing stones. If there is further concern of subtle stone formation, a follow up study with prolonged fasting could be obtained for further sensitivity Common bile duct: Diameter: 4 mm Liver: No focal lesion identified. Within normal limits in parenchymal echogenicity. Portal vein is patent on color Doppler imaging with normal direction of  blood flow towards the liver. Other: Right-sided pleural effusion. IMPRESSION: No gallstones or ductal dilatation. However the gallbladder is poorly distended. If needed follow up study with prolonged fasting could be considered as clinically appropriate. Pleural effusion Electronically Signed   By: Karen Kays M.D.   On: 02/26/2023 12:49   ECHOCARDIOGRAM COMPLETE Result Date: 02/26/2023    ECHOCARDIOGRAM REPORT   Patient Name:   Colin [name] Date of Exam: 02/26/2023 Medical Rec #:  [id number]    Height:       62.0 in Accession #:    [id number]   Weight:       132.3 lb Date of Birth:  1950/11/04    BSA:          1.604 m Patient Age:    73 years     BP:           111/58 mmHg Patient Gender: M            HR:           63 bpm. Exam Location:  Inpatient Procedure: 2D Echo, Cardiac Doppler, Color Doppler and Intracardiac            Opacification Agent Indications:    CHF  History:  Patient has prior history of Echocardiogram examinations, most                 recent 11/24/2022. CHF, NSTEMI and CAD, Prior CABG,                 Arrythmias:Bradycardia, Signs/Symptoms:Shortness of Breath and                 Syncope; Risk Factors:Hypertension.  Sonographer:    Webb Laws Referring Phys: [id number] SAGAR H JINWALA IMPRESSIONS  1. Left ventricular ejection fraction, by estimation, is 30 to 35%. The left ventricle has moderately decreased function. The left ventricle demonstrates global hypokinesis. The left ventricular internal cavity size was mildly dilated. Left ventricular diastolic parameters are consistent with Grade III diastolic dysfunction (restrictive). Elevated left atrial pressure.  2. Right ventricular systolic function is moderately reduced. The right ventricular size is normal. Tricuspid regurgitation signal is inadequate for assessing PA pressure.  3. Left atrial size was mild to moderately dilated.  4. Large pleural effusion in the left lateral region.  5. The mitral valve is normal in structure.  Moderate to severe mitral valve regurgitation. No evidence of mitral stenosis.  6. The aortic valve is tricuspid. There is mild calcification of the aortic valve. There is mild thickening of the aortic valve. Aortic valve regurgitation is not visualized. Aortic valve sclerosis/calcification is present, without any evidence of aortic stenosis. Comparison(s): Prior images reviewed side by side. Mitral insufficiency is worse and left heart filling pressures are higher. FINDINGS  Left Ventricle: Left ventricular ejection fraction, by estimation, is 30 to 35%. The left ventricle has moderately decreased function. The left ventricle demonstrates global hypokinesis. Definity contrast agent was given IV to delineate the left ventricular endocardial borders. The left ventricular internal cavity size was mildly dilated. There is no left ventricular hypertrophy. Left ventricular diastolic parameters are consistent with Grade III diastolic dysfunction (restrictive). Elevated left atrial pressure. Right Ventricle: The right ventricular size is normal. No increase in right ventricular wall thickness. Right ventricular systolic function is moderately reduced. Tricuspid regurgitation signal is inadequate for assessing PA pressure. Left Atrium: Left atrial size was mild to moderately dilated. Right Atrium: Right atrial size was normal in size. Pericardium: There is no evidence of pericardial effusion. Mitral Valve: The mitral valve is normal in structure. Moderate to severe mitral valve regurgitation, with posteriorly-directed jet. No evidence of mitral valve stenosis. Tricuspid Valve: The tricuspid valve is normal in structure. Tricuspid valve regurgitation is not demonstrated. Aortic Valve: The aortic valve is tricuspid. There is mild calcification of the aortic valve. There is mild thickening of the aortic valve. Aortic valve regurgitation is not visualized. Aortic valve sclerosis/calcification is present, without any evidence of  aortic stenosis. Pulmonic Valve: The pulmonic valve was normal in structure. Pulmonic valve regurgitation is trivial. No evidence of pulmonic stenosis. Aorta: The aortic root and ascending aorta are structurally normal, with no evidence of dilitation. IAS/Shunts: No atrial level shunt detected by color flow Doppler. Additional Comments: There is a large pleural effusion in the left lateral region.  LEFT VENTRICLE PLAX 2D LVIDd:         5.72 cm      Diastology LVIDs:         4.72 cm      LV e' medial:    4.03 cm/s LV PW:         1.00 cm      LV E/e' medial:  30.5 LV IVS:  0.70 cm      LV e' lateral:   5.52 cm/s LVOT diam:     1.90 cm      LV E/e' lateral: 22.3 LV SV:         41 LV SV Index:   25 LVOT Area:     2.84 cm  LV Volumes (MOD) LV vol d, MOD A2C: 179.0 ml LV vol d, MOD A4C: 136.0 ml LV vol s, MOD A2C: 114.0 ml LV vol s, MOD A4C: 83.0 ml LV SV MOD A2C:     65.0 ml LV SV MOD A4C:     136.0 ml LV SV MOD BP:      56.3 ml RIGHT VENTRICLE            IVC RV Basal diam:  3.60 cm    IVC diam: 1.70 cm RV S prime:     5.00 cm/s TAPSE (M-mode): 1.3 cm LEFT ATRIUM             Index        RIGHT ATRIUM           Index LA diam:        4.60 cm 2.87 cm/m   RA Area:     10.60 cm LA Vol (A2C):   45.8 ml 28.56 ml/m  RA Volume:   21.00 ml  13.10 ml/m LA Vol (A4C):   49.7 ml 30.99 ml/m LA Biplane Vol: 49.4 ml 30.80 ml/m  AORTIC VALVE LVOT Vmax:   87.00 cm/s LVOT Vmean:  55.500 cm/s LVOT VTI:    0.144 m  AORTA Ao Root diam: 2.90 cm Ao Asc diam:  3.40 cm MITRAL VALVE MV Area (PHT): 5.66 cm       SHUNTS MV Decel Time: 134 msec       Systemic VTI:  0.14 m MR Peak grad:    199.4 mmHg   Systemic Diam: 1.90 cm MR Mean grad:    119.0 mmHg MR Vmax:         706.00 cm/s MR Vmean:        505.0 cm/s MR PISA:         2.26 cm MR PISA Eff ROA: 11 mm MR PISA Radius:  0.60 cm MV E velocity: 123.00 cm/s MV A velocity: 40.60 cm/s MV E/A ratio:  3.03 Mihai Croitoru MD Electronically signed by Thurmon Fair MD Signature Date/Time:  02/26/2023/12:47:50 PM    Final    US RENAL Result Date: 02/26/2023 CLINICAL DATA:  Chronic kidney disease. EXAM: RENAL / URINARY TRACT ULTRASOUND COMPLETE COMPARISON:  None Available. FINDINGS: Right Kidney: Renal measurements: 10.1 x 4.5 x 4.2 cm = volume: 102 mL. Small cysts measure up to 1.9 cm. Normal echogenicity. No hydronephrosis or shadowing stone. Left Kidney: Renal measurements: 10.0 x 4.5 x 4.9 cm = volume: 115 mL. Several cysts measure up to 4.5 cm. Normal echogenicity. No hydronephrosis or shadowing stone. Bladder: Appears normal for degree of bladder distention. Other: Moderate bilateral pleural effusions are partially visualized. IMPRESSION: 1. Small bilateral renal cysts. No hydronephrosis or shadowing stone. 2. Bilateral pleural effusions. Electronically Signed   By: Elgie Collard M.D.   On: 02/26/2023 09:45   DG Chest Left Decubitus Result Date: 02/25/2023 CLINICAL DATA:  Pleural effusion, short of breath EXAM: CHEST - LEFT DECUBITUS COMPARISON:  02/25/2023 FINDINGS: Left lateral decubitus radiograph of the chest demonstrates moderate left pleural effusion, which is partially free-flowing. Small right pleural effusion. No pneumothorax. IMPRESSION: 1. Moderate left pleural effusion, which  is at least partially free-flowing. 2. Small free-flowing right pleural effusion. Electronically Signed   By: Sharlet Salina M.D.   On: 02/25/2023 18:22   DG Chest 2 View Result Date: 02/25/2023 CLINICAL DATA:  shortness of breath for 1 day. History of diabetes, hypertension. Ex-smoker. EXAM: CHEST - 2 VIEW COMPARISON:  12/30/2022 FINDINGS: Median sternotomy for CABG. Atherosclerosis in the transverse aorta. Numerous leads and wires project over the chest. Midline trachea. Moderate cardiomegaly. A moderate left pleural effusion with possible mild loculation laterally. Tiny right pleural effusion. No pneumothorax. Interstitial edema is moderate, increased. Left-greater-than-right base airspace disease.  IMPRESSION: Moderate congestive heart failure. Moderate to large left pleural effusion with possible mild loculation. Small right pleural effusion Left-greater-than-right base airspace disease, most likely atelectasis. At the left lung base, infection cannot be excluded. Aortic Atherosclerosis (ICD10-I70.0). Electronically Signed   By: Jeronimo Greaves M.D.   On: 02/25/2023 15:32    Cardiac Studies:  ECG: SR LAD poor R wave progression    Telemetry:  NSR   Echo: EF 25-30% 11/2022  Medications:    atorvastatin  80 mg Oral Daily   clopidogrel  75 mg Oral Daily   enoxaparin (LOVENOX) injection  30 mg Subcutaneous Q24H   furosemide  80 mg Intravenous BID   insulin aspart  0-6 Units Subcutaneous TID WC   insulin glargine-yfgn  12 Units Subcutaneous QHS   isosorbide-hydrALAZINE  1 tablet Oral TID   levothyroxine  25 mcg Oral Q0600   sodium chloride flush  3 mL Intravenous Q12H      albumin human      Assessment/Plan:  Colin [name] is a 73 y.o. male with a hx of HFrEF, CAD s/p CABG x 3, HTN, HLD, T2DM, CKD stage 4, COPD who is being seen 02/25/2023 for the evaluation of acute on chronic systolic heart failure at the request of Dr. Austin Miles. Patient non compliant with meds last 2 weeks  CHF:  known ischemic DCM. Non compliant with meds. Admitted with gross volume overload and bilateral effusions L> R Good diuresis with iv lasix 80 mg bid. IR left thoracentesis this am Changed to bidil for ease of compliance and better BP control Use IS post tap to help expand lungs CRF:  per nephrology CR 2.76 at baseline follow with diuresis CAD/CABG:  no agnina troponin negative no acute ECG changes  DM:  BS > 300 on admission per primary service Thyroid: TSH pending Free T4 normal 12/08/22  [name] 02/27/2023, 8:03 AM

## 2023-02-27 NOTE — Assessment & Plan Note (Addendum)
Echocardiogram with reduced LV systolic function with EF 30 to 35%, global hypokinesis, LV internal cavity size is dilatated. RV systolic function with moderate reduction, LA with moderate dilatation, moderate to severe mitral regurgitation,   Urine output is 2,250  ml Systolic blood pressure 140 mmHg range.  01/22  IV albumin 25 g   After load reduction with hydralazine and isosorbide.  Oral furosemide 60 mg po daily  Add ted hose   Cardiogenic pulmonary edema with bilateral pleural effusions,  Oxygenation improved today 02 saturation is 97% on room air.

## 2023-02-27 NOTE — Inpatient Diabetes Management (Signed)
Inpatient Diabetes Program Recommendations  AACE/ADA: New Consensus Statement on Inpatient Glycemic Control (2015)  Target Ranges:  Prepandial:   less than 140 mg/dL      Peak postprandial:   less than 180 mg/dL (1-2 hours)      Critically ill patients:  140 - 180 mg/dL   Lab Results  Component Value Date   GLUCAP 190 (H) 02/27/2023   HGBA1C 11.6 (H) 02/25/2023    Review of Glycemic Control  Latest Reference Range & Units 02/26/23 16:18 02/26/23 20:55 02/27/23 06:13 02/27/23 06:31 02/27/23 11:29  Glucose-Capillary 70 - 99 mg/dL 161 (H) 096 (H) 68 (L) 89 190 (H)  (H): Data is abnormally high (L): Data is abnormally low Diabetes history: Type 2 DM Outpatient Diabetes medications: Lantus 15 units at bedtime, Ademelog 3 units TID Current orders for Inpatient glycemic control: Novolog 0-9 units TID, Novolog 3 units TID, Semglee 15 units QHS   Inpatient Diabetes Program Recommendations:     Noted hypoglycemia this AM of 41 mg/dL following Semglee and Novolog. Novolog was given two hours after the previous CBG.    Given renal status, consider: -reducing Semglee to 12 units at bedtime -reducing correction to Novolog 0-6 units TID   Spoke with patient regarding outpatient diabetes management. Patient reports stopping his insulin two weeks ago due to his right leg swelling. He has additional supply at the pharmacy. Denies issues with affordability or transportation. Recently, switched from 70/30 to Lantus due to hypoglycemia. Seems to be some confusion regarding dosing, however, denies hypoglycemia at home.  Reviewed patient's current A1c of 11.6%. Explained what a A1c is and what it measures. Also reviewed goal A1c with patient, importance of good glucose control @ home, and blood sugar goals. Reviewed patho of DM, need for insulin, role of insulin, role of pancreas, impact from cardiac perspective, vascular changes and commorbidities.  Patient has a meter and has been ordered a FSL, however,  prefers to use the meter.  Discussed need to take insulin as prescribed. Patient plans to follow up with PCP and take as prescribed. No additional questions at this time.   Thanks, Lujean Rave, MSN, RNC-OB Diabetes Coordinator [phone] (8a-5p)

## 2023-02-27 NOTE — Assessment & Plan Note (Addendum)
AKI, Hypokalemia, Proteinuria and hypoalbuminemia   Renal function with serum cr at 3,15 with K at 4,2 and serum bicarbonate at 31  Na 140 Mg 1,9   Transitioned to oral furosemide Follow up renal function and electrolytes in am.

## 2023-02-27 NOTE — Procedures (Signed)
PROCEDURE SUMMARY:  Successful US guided Left thoracentesis. Yielded of hazy yellow fluid. Patient tolerated procedure well. No immediate complications. EBL = trace  Specimen sent for labs.  Post procedure chest X-ray reveals no pneumothorax  Welford Christmas Charmian Muff PA-C 02/27/2023 8:45 AM

## 2023-02-27 NOTE — Assessment & Plan Note (Signed)
Continue levothyroxine 

## 2023-02-27 NOTE — Progress Notes (Signed)
 Patient returned from IR via bed.

## 2023-02-27 NOTE — Assessment & Plan Note (Signed)
Continue blood pressure control with Bidil.

## 2023-02-27 NOTE — Assessment & Plan Note (Addendum)
Hypoglycemia.   Patient was placed on low dose insulin for glucose control. At home will continue basal insulin at a reduced dose.

## 2023-02-27 NOTE — Assessment & Plan Note (Addendum)
SP CABG  No acute coronary syndrome.

## 2023-02-28 DIAGNOSIS — N184 Chronic kidney disease, stage 4 (severe): Secondary | ICD-10-CM | POA: Diagnosis not present

## 2023-02-28 DIAGNOSIS — E1169 Type 2 diabetes mellitus with other specified complication: Secondary | ICD-10-CM | POA: Diagnosis not present

## 2023-02-28 DIAGNOSIS — I5023 Acute on chronic systolic (congestive) heart failure: Secondary | ICD-10-CM | POA: Diagnosis not present

## 2023-02-28 DIAGNOSIS — I2510 Atherosclerotic heart disease of native coronary artery without angina pectoris: Secondary | ICD-10-CM | POA: Diagnosis not present

## 2023-02-28 LAB — GLUCOSE, CAPILLARY
Glucose-Capillary: 67 mg/dL — ABNORMAL LOW (ref 70–99)
Glucose-Capillary: 88 mg/dL (ref 70–99)
Glucose-Capillary: 268 mg/dL — ABNORMAL HIGH (ref 70–99)
Glucose-Capillary: 302 mg/dL — ABNORMAL HIGH (ref 70–99)
Glucose-Capillary: 277 mg/dL — ABNORMAL HIGH (ref 70–99)

## 2023-02-28 LAB — BASIC METABOLIC PANEL
Anion gap: 5 (ref 5–15)
BUN: 22 mg/dL (ref 8–23)
CO2: 33 mmol/L — ABNORMAL HIGH (ref 22–32)
Calcium: 8.1 mg/dL — ABNORMAL LOW (ref 8.9–10.3)
Chloride: 102 mmol/L (ref 98–111)
Creatinine, Ser: 2.86 mg/dL — ABNORMAL HIGH (ref 0.61–1.24)
GFR, Estimated: 23 mL/min — ABNORMAL LOW (ref >60)
Glucose, Bld: 139 mg/dL — ABNORMAL HIGH (ref 70–99)
Potassium: 4.1 mmol/L (ref 3.5–5.1)
Sodium: 140 mmol/L (ref 135–145)

## 2023-02-28 LAB — MAGNESIUM: Magnesium: 2 mg/dL (ref 1.7–2.4)

## 2023-02-28 LAB — CYTOLOGY - NON PAP

## 2023-02-28 MED ORDER — POTASSIUM CHLORIDE CRYS ER 20 MEQ PO TBCR
40.0000 meq | EXTENDED_RELEASE_TABLET | Freq: Once | ORAL | Status: AC
  Administered 2023-02-28: 40 meq via ORAL
  Filled 2023-02-28: qty 2

## 2023-02-28 MED ORDER — MELATONIN 3 MG PO TABS
3.0000 mg | ORAL_TABLET | Freq: Every evening | ORAL | Status: DC | PRN
  Administered 2023-02-28: 3 mg via ORAL
  Filled 2023-02-28: qty 1

## 2023-02-28 MED ORDER — INSULIN GLARGINE-YFGN 100 UNIT/ML ~~LOC~~ SOLN
8.0000 [IU] | Freq: Every day | SUBCUTANEOUS | Status: DC
  Administered 2023-02-28 – 2023-03-01 (×2): 8 [IU] via SUBCUTANEOUS
  Filled 2023-02-28 (×3): qty 0.08

## 2023-02-28 MED ORDER — CARVEDILOL 6.25 MG PO TABS
6.2500 mg | ORAL_TABLET | Freq: Two times a day (BID) | ORAL | Status: DC
  Administered 2023-02-28 – 2023-03-02 (×4): 6.25 mg via ORAL
  Filled 2023-02-28 (×4): qty 1

## 2023-02-28 MED ORDER — FUROSEMIDE 20 MG PO TABS
60.0000 mg | ORAL_TABLET | Freq: Every day | ORAL | Status: DC
  Administered 2023-03-01 – 2023-03-02 (×2): 60 mg via ORAL
  Filled 2023-02-28 (×2): qty 1

## 2023-02-28 NOTE — TOC Initial Note (Signed)
Transition of Care [facility]) - Initial/Assessment Note    Patient Details  Name: Colin [name] MRN: [id number] Date of Birth: 19-Apr-1950  [facility]) CM/SW Contact:    Leone Haven, RN Phone Number: 02/28/2023, 2:54 PM  Clinical Narrative:                 From home with son,  speaks Levi Aland and some English has PCP and insurance on file, states has no HH services in place at this time, has cane, walker and w/chair at home.  States his son will transport him  home at [employer] and son  is support system, states gets medications from [facility] clinic.  Pta self ambulatory with walker. NCM offered choice for [facility] for CHF disease management,  he does not have a preference.  NCM made referral to [facility] with [facility].  She is able to take referral.  Soc will begin 24 to 48 hrs post dc.   Expected Discharge Plan: Home w Home Health Services Barriers to Discharge: Continued Medical Work up   Patient Goals and CMS Choice Patient states their goals for this hospitalization and ongoing recovery are:: return home with son   Choice offered to / list presented to : NA      Expected Discharge Plan and Services In-house Referral: NA Discharge Planning Services: CM Consult Post Acute Care Choice: NA Living arrangements for the past 2 months: Single Family Home                 DME Arranged: N/A DME Agency: NA       HH Arranged: RN, Disease Management HH Agency: [facility] Date HH Agency Contacted: 02/28/23 Time HH Agency Contacted: 1454 Representative spoke with at [facility] Agency: Tresa Endo  Prior Living Arrangements/Services Living arrangements for the past 2 months: Single Family Home Lives with:: Adult Children (son) Patient language and need for interpreter reviewed:: Yes        Need for Family Participation in Patient Care: Yes (Comment) Care giver support system in place?: Yes (comment)   Criminal Activity/Legal Involvement Pertinent to Current Situation/Hospitalization:  No - Comment as needed  Activities of Daily Living   ADL Screening (condition at time of admission) Independently performs ADLs?: Yes (appropriate for developmental age) Is the patient deaf or have difficulty hearing?: No Does the patient have difficulty seeing, even when wearing glasses/contacts?: No Does the patient have difficulty concentrating, remembering, or making decisions?: No  Permission Sought/Granted Permission sought to share information with : Case Manager Permission granted to share information with : Yes, Verbal Permission Granted     Permission granted to share info w AGENCY: HH/ Son        Emotional Assessment Appearance:: Appears stated age Attitude/Demeanor/Rapport: Engaged Affect (typically observed): Appropriate Orientation: : Oriented to Self, Oriented to Place, Oriented to  Time, Oriented to Situation Alcohol / Substance Use: Not Applicable Psych Involvement: No (comment)  Admission diagnosis:  Acute on chronic combined systolic and diastolic heart failure (HCC) [I50.43] Pleural effusion on left [J90] Acute on chronic congestive heart failure, unspecified heart failure type (HCC) [I50.9] Patient Active Problem List   Diagnosis Date Noted   Hypothyroidism 02/27/2023   Hypoalbuminemia 02/27/2023   Acute on chronic systolic CHF (congestive heart failure) (HCC) 02/25/2023   Acute respiratory failure with hypoxia (HCC) 12/30/2022   Hypoglycemia 12/07/2022   Failure to thrive in adult 12/06/2022   Epigastric pain 11/24/2022   GERD (gastroesophageal reflux disease) 11/24/2022   CKD (chronic kidney  disease) stage 4, GFR 15-29 ml/min (HCC) 11/24/2022   Acute on chronic combined systolic and diastolic CHF (congestive heart failure) (HCC) 11/23/2022   Injury of right shoulder 03/02/2022   Sinus bradycardia 01/22/2022   Near syncope 01/22/2022   COPD (chronic obstructive pulmonary disease) (HCC) 12/21/2021   Protein-calorie malnutrition, severe 08/29/2020    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   Coronary artery disease 08/23/2020   HFrEF (heart failure with reduced ejection fraction) (HCC)    Sinus pause    NSTEMI (non-ST elevated myocardial infarction) (HCC)    Smoking    Thoracic aorta atherosclerosis (HCC)    Essential hypertension 10/07/2019   Type 2 diabetes mellitus with hyperlipidemia (HCC) 04/26/2010   HLD (hyperlipidemia) 04/26/2010   PCP:  Grayce Sessions, NP Pharmacy:   [facility] [address] [place] [address] Phone: [phone] Fax: [phone]  [facility] [address] [place] [address] Phone: [phone] Fax: [phone]     Social Drivers of Health (SDOH) Social History: SDOH Screenings   Food Insecurity: No Food Insecurity (02/26/2023)  Housing: Low Risk  (12/30/2022)  Transportation Needs: No Transportation Needs (02/26/2023)  Utilities: Not At Risk (02/26/2023)  Depression (PHQ2-9): High Risk (01/11/2022)  Tobacco Use: Medium Risk (02/25/2023)   SDOH Interventions:     Readmission Risk Interventions    01/01/2023   12:57 PM 12/07/2022    4:35 PM  Readmission Risk Prevention Plan  Transportation Screening Complete Complete  PCP or Specialist Appt within 5-7 Days  Complete  PCP or Specialist Appt within 3-5 Days Complete   Home Care Screening  Complete  Medication Review (RN CM)  Complete  HRI or Home Care Consult Complete   Palliative Care Screening Not Applicable   Medication Review (RN Care Manager) Complete

## 2023-02-28 NOTE — Progress Notes (Signed)
TRH night cross cover note:   I was notified by RN of the patient's request for a sleep aid. I subsequently placed order for prn melatonin for insomnia.     Keelan Tripodi, DO Hospitalist  

## 2023-02-28 NOTE — Progress Notes (Addendum)
  Progress Note   Patient: Colin [name] AOZ:[id number] DOB: Dec 09, 1950 DOA: 02/25/2023     3 DOS: the patient was seen and examined on 02/28/2023   Brief hospital course: 72/M w CAD/CABG, chronic combined CHF, EF 25-30%, type 2 diabetes, CKD 4, hypertension presented to the ED with shortness of breath and swelling.  Patient's son reports that he ran out of meds few weeks ago and has been unable to obtain refills. -In the ER hypertensive, labs with potassium 3.2, blood glucose 311, creatinine 2.7, BNP 3916, troponin normal, chest x-ray with significant vascular congestion  01/23 responding to diuresis Add ted hose   Assessment and Plan: * Acute on chronic systolic CHF (congestive heart failure) (HCC) Echocardiogram with reduced LV systolic function with EF 30 to 35%, global hypokinesis, LV internal cavity size is dilatated. RV systolic function with moderate reduction, LA with moderate dilatation, moderate to severe mitral regurgitation,   Urine output is 4,375  ml Systolic blood pressure 150 mmHg range.  01/22  IV albumin 25 g   Plan to continue diuresis with furosemide transition to po furosemide in am.  After load reduction with hydralazine and isosorbide.  Add ted hose   Cardiogenic pulmonary edema with bilateral pleural effusions,  Oxygenation improved today 02 saturation is 95% on room air.   CKD (chronic kidney disease) stage 4, GFR 15-29 ml/min (HCC) Hypokalemia, Proteinuria and hypoalbuminemia   \stable renal function with serum cr at 2,86 with K at 4,1 and serum bicarbonate at 33  Na 140 mg 2,0   Plan to continue K correction with Kcl and check renal function and electrolytes in am.  Continue aggressive diuresis with furosemide IV .   Type 2 diabetes mellitus with hyperlipidemia (HCC) Hypoglycemia.   Fasting glucose today 139, capillary 67  Continue with basal insulin,and low dosw insulin sliding scale.  Continue close monitoring.   Coronary artery disease SP CABG   No acute coronary syndrome.   Essential hypertension Continue blood pressure control with Bidil.   Hypothyroidism Continue levothyroxine.         Subjective: patient with improvement in dyspnea and edema, but not yet back to baseline, has no PND or orthopnea   Physical Exam: Vitals:   02/27/23 2024 02/28/23 0106 02/28/23 0506 02/28/23 0751  BP: (!) 142/74 (!) 150/70 (!) 150/62 (!) 151/72  Pulse: 79 78 75 80  Resp: 20 20 20 20   Temp: 98.1 F (36.7 C) 98.2 F (36.8 C) 98 F (36.7 C) 97.8 F (36.6 C)  TempSrc: Oral Oral Oral Oral  SpO2: 96% 99% 96% 95%  Weight:   66.2 kg   Height:       Neurology awake and alert ENT with mild pallor Cardiovascular with S1 and S2 present and regular with no gallops, rubs or murmurs Mild JVD Lower extremity edema pitting ++ Respiratory with scattered raled with no wheezing or rhonchi Abdomen with no distention  Data Reviewed:    Family Communication: no family at the bedside   Disposition: Status is: Inpatient Remains inpatient appropriate because: heart failure on IV diuresis   Planned Discharge Destination: Home      Author: Coralie Keens, MD 02/28/2023 8:39 AM  For on call review [email or web address].

## 2023-02-28 NOTE — Inpatient Diabetes Management (Signed)
Inpatient Diabetes Program Recommendations  AACE/ADA: New Consensus Statement on Inpatient Glycemic Control (2015)  Target Ranges:  Prepandial:   less than 140 mg/dL      Peak postprandial:   less than 180 mg/dL (1-2 hours)      Critically ill patients:  140 - 180 mg/dL   Lab Results  Component Value Date   GLUCAP 88 02/28/2023   HGBA1C 11.6 (H) 02/25/2023    Review of Glycemic Control  Latest Reference Range & Units 02/27/23 11:29 02/27/23 16:48 02/27/23 21:12 02/28/23 06:15 02/28/23 06:36  Glucose-Capillary 70 - 99 mg/dL 161 (H) 096 (H) 045 (H) 67 (L) 88  (H): Data is abnormally high (L): Data is abnormally low Diabetes history: Type 2 DM Outpatient Diabetes medications: Lantus 15 units at bedtime, Ademelog 3 units TID Current orders for Inpatient glycemic control: Novolog 0-6 units TID, Semglee 12 units QHS   Inpatient Diabetes Program Recommendations:     Noted hypoglycemia this AM of 67 mg/dL following Semglee.  Consider further reducing Semglee to 8 units at bedtime   Thanks, Lujean Rave, MSN, RNC-OB Diabetes Coordinator [phone] (8a-5p)

## 2023-02-28 NOTE — Progress Notes (Signed)
Patient Name: Colin [name] Date of Encounter: 02/28/2023 [facility] Cardiologist: [name], MD   Interval Summary  .    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   Attempted to obtain professional interpreter this morning, patient meanwhile spoke adequate English during encounter, able to understand and answered questions appropriately. He states he feels much better, breathing well, leg swelling has resolved, has not been out of bed much, denied chest pain.    Vital Signs .    Vitals:   02/27/23 1608 02/27/23 2024 02/28/23 0106 02/28/23 0506  BP: (!) 157/70 (!) 142/74 (!) 150/70 (!) 150/62  Pulse: 72 79 78 75  Resp: 20 20 20 20   Temp: 97.6 F (36.4 C) 98.1 F (36.7 C) 98.2 F (36.8 C) 98 F (36.7 C)  TempSrc: Oral Oral Oral Oral  SpO2: 96% 96% 99% 96%  Weight:    66.2 kg  Height:        Intake/Output Summary (Last 24 hours) at 02/28/2023 0745 Last data filed at 02/28/2023 0508 Gross per 24 hour  Intake 720 ml  Output 4175 ml  Net -3455 ml      02/28/2023    5:06 AM 02/27/2023    5:39 AM 02/26/2023    2:20 PM  Last 3 Weights  Weight (lbs) 145 lb 15.1 oz 140 lb 6.9 oz 146 lb 2.6 oz  Weight (kg) 66.2 kg 63.7 kg 66.3 kg      Telemetry/ECG    Sinus rhythm 60-70s - Personally Reviewed  Physical Exam .   GEN: No acute distress.   Neck: No JVD  Cardiac: RRR, no murmurs, rubs, or gallops.  Respiratory: Clear to auscultation bilaterally. On room air. Speaks full sentence.  GI: Soft, nontender, non-distended  MS: No leg edema  Assessment & Plan .     Acute on chronic combined CHF  - presented with increased resting and exertional SOB, weakness, in the setting of non-compliance with medications for 2 weeks - BNP 3916, POA  - CXR showed moderate CHF, moderate to large left pleural effusion with possible  loculation, L>R base airspace disease, POA  - Echo from 02/26/23: LVEF 30-35%, global hypokinesis, grade III DD, mod reduced RV, mild to mod LAE, large pleural effusion in left lateral region, mod to severe MR, aortic sclerosis  - on PTA Lasix 40mg  daily, s/p IV Lasix 80mg  BID, Net - (UOP 4375 ml over 24 hours), Cr 276 >2.78>2.8>2.86, overall stable renal index, clinically euvolemic, will transition to PO Lasix at 60mg  daily, will need routine BMP given renal disease  - GDMT: will resume PTA coreg 6.25mg  BID today, changed to bidil 20/37.5mg  TID to promote compliance this admission, advanced renal disease prohibiting use of ACEI/ARNI/MRA/SGLT2I  - discussed the importance of medication compliance   CAD with hx of CABG x3 in 2022 - no angina - Hs trop negative 14 >15  - continue PTA medical therapy with coreg, lipitor, plavix, switched to bidil as above   HTN - BP mildly elevated 140-150s systolic, resume PTA coreg 6.25mg  BID today   CKD IV Pleural effusion  Type 2 DM  COPD - per primary team     For questions or updates, please contact [facility] Please consult [email or web address] for contact info under        Signed, Cyndi Bender, NP

## 2023-02-28 NOTE — Plan of Care (Signed)
  Problem: Coping: Goal: Ability to adjust to condition or change in health will improve Outcome: Progressing   Problem: Skin Integrity: Goal: Risk for impaired skin integrity will decrease Outcome: Progressing   Problem: Health Behavior/Discharge Planning: Goal: Ability to manage health-related needs will improve Outcome: Progressing

## 2023-03-01 DIAGNOSIS — I5023 Acute on chronic systolic (congestive) heart failure: Secondary | ICD-10-CM | POA: Diagnosis not present

## 2023-03-01 DIAGNOSIS — N184 Chronic kidney disease, stage 4 (severe): Secondary | ICD-10-CM | POA: Diagnosis not present

## 2023-03-01 DIAGNOSIS — E1169 Type 2 diabetes mellitus with other specified complication: Secondary | ICD-10-CM | POA: Diagnosis not present

## 2023-03-01 DIAGNOSIS — I2510 Atherosclerotic heart disease of native coronary artery without angina pectoris: Secondary | ICD-10-CM | POA: Diagnosis not present

## 2023-03-01 LAB — BASIC METABOLIC PANEL
Anion gap: 8 (ref 5–15)
BUN: 28 mg/dL — ABNORMAL HIGH (ref 8–23)
CO2: 31 mmol/L (ref 22–32)
Calcium: 8 mg/dL — ABNORMAL LOW (ref 8.9–10.3)
Chloride: 101 mmol/L (ref 98–111)
Creatinine, Ser: 3.15 mg/dL — ABNORMAL HIGH (ref 0.61–1.24)
GFR, Estimated: 20 mL/min — ABNORMAL LOW (ref >60)
Glucose, Bld: 187 mg/dL — ABNORMAL HIGH (ref 70–99)
Potassium: 4.2 mmol/L (ref 3.5–5.1)
Sodium: 140 mmol/L (ref 135–145)

## 2023-03-01 LAB — GLUCOSE, CAPILLARY
Glucose-Capillary: 107 mg/dL — ABNORMAL HIGH (ref 70–99)
Glucose-Capillary: 209 mg/dL — ABNORMAL HIGH (ref 70–99)
Glucose-Capillary: 227 mg/dL — ABNORMAL HIGH (ref 70–99)
Glucose-Capillary: 336 mg/dL — ABNORMAL HIGH (ref 70–99)

## 2023-03-01 LAB — MAGNESIUM: Magnesium: 1.9 mg/dL (ref 1.7–2.4)

## 2023-03-01 MED ORDER — MAGNESIUM SULFATE 2 GM/50ML IV SOLN
2.0000 g | Freq: Once | INTRAVENOUS | Status: AC
  Administered 2023-03-01: 2 g via INTRAVENOUS
  Filled 2023-03-01: qty 50

## 2023-03-01 NOTE — Progress Notes (Signed)
  Progress Note   Patient: Colin [name] LKG:[id number] DOB: 01-26-1951 DOA: 02/25/2023     4 DOS: the patient was seen and examined on 03/01/2023   Brief hospital course: 72/M w CAD/CABG, chronic combined CHF, EF 25-30%, type 2 diabetes, CKD 4, hypertension presented to the ED with shortness of breath and swelling.  Patient's son reports that he ran out of meds few weeks ago and has been unable to obtain refills. -In the ER hypertensive, labs with potassium 3.2, blood glucose 311, creatinine 2.7, BNP 3916, troponin normal, chest x-ray with significant vascular congestion  01/23 responding to diuresis Add ted hose  01/24 clinically improving edema, renal function not yet stable.   Assessment and Plan: * Acute on chronic systolic CHF (congestive heart failure) (HCC) Echocardiogram with reduced LV systolic function with EF 30 to 35%, global hypokinesis, LV internal cavity size is dilatated. RV systolic function with moderate reduction, LA with moderate dilatation, moderate to severe mitral regurgitation,   Urine output is 2,250  ml Systolic blood pressure 140 mmHg range.  01/22  IV albumin 25 g   After load reduction with hydralazine and isosorbide.  Oral furosemide 60 mg po daily  Add ted hose   Cardiogenic pulmonary edema with bilateral pleural effusions,  Oxygenation improved today 02 saturation is 97% on room air.   CKD (chronic kidney disease) stage 4, GFR 15-29 ml/min (HCC) AKI, Hypokalemia, Proteinuria and hypoalbuminemia   Renal function with serum cr at 3,15 with K at 4,2 and serum bicarbonate at 31  Na 140 Mg 1,9   Transitioned to oral furosemide Follow up renal function and electrolytes in am.   Type 2 diabetes mellitus with hyperlipidemia (HCC) Hypoglycemia.   Fasting glucose today 187 Continue with basal insulin,and low dose insulin sliding scale.  Continue close monitoring.   Coronary artery disease SP CABG  No acute coronary syndrome.   Essential  hypertension Continue blood pressure control with Bidil.   Hypothyroidism Continue levothyroxine.         Subjective: Patient is feeling better, dyspnea and edema have improved, today with no PND or orthopnea   Physical Exam: Vitals:   03/01/23 0503 03/01/23 0745 03/01/23 1220 03/01/23 1529  BP: (!) 140/65 (!) 124/59 (!) 144/61 (!) 134/57  Pulse: 70 72 68 69  Resp: 18 18 18 17   Temp: 98.1 F (36.7 C) 97.8 F (36.6 C) 97.9 F (36.6 C) 97.6 F (36.4 C)  TempSrc: Oral Oral Oral Oral  SpO2: 95% 90% 99% 97%  Weight: 64.1 kg     Height:       Neurology awake and alert ENT with mild pallor Cardiovascular with S1 and S2 present and regular with no gallops,or rubs, positive systolic murmur at the apex.  Respiratory with no rales or wheezing, no rhonchi Abdomen with no distention  Trace lower extremity edema, ted hose in place  Data Reviewed:    Family Communication: no family at the bedside   Disposition: Status is: Inpatient Remains inpatient appropriate because: follow up renal function  Planned Discharge Destination: Home      Author: Coralie Keens, MD 03/01/2023 5:24 PM  For on call review [email or web address].

## 2023-03-01 NOTE — Plan of Care (Signed)

## 2023-03-01 NOTE — Progress Notes (Signed)
Cardiologist:  Rosemary Holms CHF:  Elwyn Lade  Subjective:   Continues to diurese  For IR thoracentesis this am   Objective:  Vitals:   02/28/23 2023 03/01/23 0055 03/01/23 0503 03/01/23 0745  BP: (!) 148/68 130/81 (!) 140/65 (!) 124/59  Pulse: 74 72 70 72  Resp: 18 18 18 18   Temp: 98.4 F (36.9 C) 98.1 F (36.7 C) 98.1 F (36.7 C) 97.8 F (36.6 C)  TempSrc: Oral Oral Oral Oral  SpO2: 94% 94% 95% 90%  Weight:   64.1 kg   Height:        Intake/Output from previous day:  Intake/Output Summary (Last 24 hours) at 03/01/2023 0840 Last data filed at 03/01/2023 1610 Gross per 24 hour  Intake 480 ml  Output 1950 ml  Net -1470 ml    Physical Exam: Less tachypnea Decreased BS left base bilateral rales JVP elevated No murmur Plus 2 LE edema  Lab Results: Basic Metabolic Panel: Recent Labs    02/28/23 0240 03/01/23 0243  NA 140 140  K 4.1 4.2  CL 102 101  CO2 33* 31  GLUCOSE 139* 187*  BUN 22 28*  CREATININE 2.86* 3.15*  CALCIUM 8.1* 8.0*  MG 2.0 1.9   Liver Function Tests: No results for input(s): "AST", "ALT", "ALKPHOS", "BILITOT", "PROT", "ALBUMIN" in the last 72 hours.   CBC: No results for input(s): "WBC", "NEUTROABS", "HGB", "HCT", "MCV", "PLT" in the last 72 hours.   Hemoglobin A1C: No results for input(s): "HGBA1C" in the last 72 hours.    Imaging: IR THORACENTESIS ASP PLEURAL SPACE W/IMG GUIDE Result Date: 02/27/2023 INDICATION: 73 year old male with acute on chronic heart failure who ran out of his medications 2 weeks ago presenting with shortness of breath and volume overload. Bilateral pleural effusions with left greater than right. Request for diagnostic and therapeutic thoracentesis. EXAM: ULTRASOUND GUIDED left THORACENTESIS MEDICATIONS: 1% lidocaine, 8 mL. COMPLICATIONS: None immediate. PROCEDURE: An ultrasound guided thoracentesis was thoroughly discussed with the patient and questions answered. The benefits, risks, alternatives and  complications were also discussed. The patient understands and wishes to proceed with the procedure. Written consent was obtained. Ultrasound was performed to localize and mark an adequate pocket of fluid in the left chest. The area was then prepped and draped in the normal sterile fashion. 1% Lidocaine was used for local anesthesia. Under ultrasound guidance a 6 Fr Safe-T-Centesis catheter was introduced. Thoracentesis was performed. The catheter was removed and a dressing applied. FINDINGS: A total of approximately 700 mL of hazy yellow fluid was removed. Samples were sent to the laboratory as requested by the clinical team. IMPRESSION: Successful ultrasound guided left thoracentesis yielding 700 mL of pleural fluid. Procedure performed by: Sherlene Shams, PA-C Electronically Signed   By: Marliss Coots M.D.   On: 02/27/2023 14:35   DG Chest 1 View Result Date: 02/27/2023 CLINICAL DATA:  Status post left thoracentesis EXAM: CHEST  1 VIEW COMPARISON:  02/25/2023 FINDINGS: Diminished volume of a left pleural effusion, now moderate with correspondingly improved aeration of the left lung base. No pneumothorax. Right lung is normally aerated. Cardiomegaly status post median sternotomy. No acute osseous findings. IMPRESSION: 1. Diminished volume of a left pleural effusion, now moderate with correspondingly improved aeration of the left lung base. No pneumothorax. 2. Cardiomegaly. Electronically Signed   By: Jearld Lesch M.D.   On: 02/27/2023 09:16    Cardiac Studies:  ECG: SR LAD poor R wave progression    Telemetry:  NSR   Echo: EF 25-30%  11/2022  Medications:    atorvastatin  80 mg Oral Daily   carvedilol  6.25 mg Oral BID WC   clopidogrel  75 mg Oral Daily   enoxaparin (LOVENOX) injection  30 mg Subcutaneous Q24H   furosemide  60 mg Oral Daily   insulin aspart  0-6 Units Subcutaneous TID WC   insulin glargine-yfgn  8 Units Subcutaneous QHS   isosorbide-hydrALAZINE  1 tablet Oral TID    levothyroxine  25 mcg Oral Q0600   sodium chloride flush  3 mL Intravenous Q12H      magnesium sulfate bolus IVPB      Assessment/Plan:  JASMAN MURRI is a 73 y.o. male with a hx of HFrEF, CAD s/p CABG x 3, HTN, HLD, T2DM, CKD stage 4, COPD who is being seen 02/25/2023 for the evaluation of acute on chronic systolic heart failure at the request of Dr. Austin Miles. Patient non compliant with meds last 2 weeks  CHF:  known ischemic DCM. Non compliant with meds. Admitted with gross volume overload and bilateral effusions L> R Good diuresis with iv lasix 80 mg bid. IR left thoracentesis  700 cc  Changed to bidil for ease of compliance and better BP control Use IS post tap to help expand lungs Left lung exam still abnormal No on lasix 60 mg oral daily  CRF:  per nephrology CR 3.15 should settle out now on oral diuretic  CAD/CABG:  no agnina troponin negative no acute ECG changes  DM:  BS > 300 on admission per primary service Thyroid: TSH 8.5 consider increasing synthroid to 50 micrograms.   Probably ok for d/c today/tomorrow will arrange outpatient f/u with [facility]  [name] 03/01/2023, 8:40 AM

## 2023-03-02 ENCOUNTER — Other Ambulatory Visit (HOSPITAL_COMMUNITY): Payer: Self-pay

## 2023-03-02 DIAGNOSIS — E1169 Type 2 diabetes mellitus with other specified complication: Secondary | ICD-10-CM | POA: Diagnosis not present

## 2023-03-02 DIAGNOSIS — I5023 Acute on chronic systolic (congestive) heart failure: Secondary | ICD-10-CM | POA: Diagnosis not present

## 2023-03-02 DIAGNOSIS — N184 Chronic kidney disease, stage 4 (severe): Secondary | ICD-10-CM | POA: Diagnosis not present

## 2023-03-02 DIAGNOSIS — I2510 Atherosclerotic heart disease of native coronary artery without angina pectoris: Secondary | ICD-10-CM | POA: Diagnosis not present

## 2023-03-02 LAB — BASIC METABOLIC PANEL
Anion gap: 8 (ref 5–15)
BUN: 30 mg/dL — ABNORMAL HIGH (ref 8–23)
CO2: 32 mmol/L (ref 22–32)
Calcium: 8.3 mg/dL — ABNORMAL LOW (ref 8.9–10.3)
Chloride: 98 mmol/L (ref 98–111)
Creatinine, Ser: 3.15 mg/dL — ABNORMAL HIGH (ref 0.61–1.24)
GFR, Estimated: 20 mL/min — ABNORMAL LOW (ref >60)
Glucose, Bld: 179 mg/dL — ABNORMAL HIGH (ref 70–99)
Potassium: 4.1 mmol/L (ref 3.5–5.1)
Sodium: 138 mmol/L (ref 135–145)

## 2023-03-02 LAB — GLUCOSE, CAPILLARY
Glucose-Capillary: 291 mg/dL — ABNORMAL HIGH (ref 70–99)
Glucose-Capillary: 141 mg/dL — ABNORMAL HIGH (ref 70–99)

## 2023-03-02 LAB — MAGNESIUM: Magnesium: 2.3 mg/dL (ref 1.7–2.4)

## 2023-03-02 MED ORDER — CARVEDILOL 6.25 MG PO TABS
6.2500 mg | ORAL_TABLET | Freq: Two times a day (BID) | ORAL | 0 refills | Status: DC
  Filled 2023-03-02: qty 60, 30d supply, fill #0

## 2023-03-02 MED ORDER — ISOSORB DINITRATE-HYDRALAZINE 20-37.5 MG PO TABS
1.0000 | ORAL_TABLET | Freq: Three times a day (TID) | ORAL | 0 refills | Status: DC
  Filled 2023-03-02: qty 90, 30d supply, fill #0

## 2023-03-02 MED ORDER — CLOPIDOGREL BISULFATE 75 MG PO TABS
75.0000 mg | ORAL_TABLET | Freq: Every day | ORAL | 0 refills | Status: DC
  Filled 2023-03-02: qty 30, 30d supply, fill #0

## 2023-03-02 MED ORDER — LEVOTHYROXINE SODIUM 25 MCG PO TABS
25.0000 ug | ORAL_TABLET | Freq: Every day | ORAL | 0 refills | Status: DC
  Filled 2023-03-02: qty 30, 30d supply, fill #0

## 2023-03-02 MED ORDER — INSULIN GLARGINE 100 UNIT/ML SOLOSTAR PEN
8.0000 [IU] | PEN_INJECTOR | Freq: Every day | SUBCUTANEOUS | 0 refills | Status: DC
  Filled 2023-03-02: qty 3, 37d supply, fill #0

## 2023-03-02 MED ORDER — INSULIN LISPRO (1 UNIT DIAL) 100 UNIT/ML (KWIKPEN)
3.0000 [IU] | PEN_INJECTOR | Freq: Three times a day (TID) | SUBCUTANEOUS | 0 refills | Status: DC
  Filled 2023-03-02: qty 3, 34d supply, fill #0

## 2023-03-02 MED ORDER — INSULIN GLARGINE 100 UNIT/ML SOLOSTAR PEN: 8.0000 [IU] | PEN_INJECTOR | Freq: Every day | SUBCUTANEOUS | Status: DC

## 2023-03-02 MED ORDER — ATORVASTATIN CALCIUM 80 MG PO TABS
80.0000 mg | ORAL_TABLET | Freq: Every day | ORAL | 0 refills | Status: DC
  Filled 2023-03-02: qty 30, 30d supply, fill #0

## 2023-03-02 MED ORDER — FUROSEMIDE 20 MG PO TABS
60.0000 mg | ORAL_TABLET | Freq: Every day | ORAL | 0 refills | Status: DC
  Filled 2023-03-02: qty 90, 30d supply, fill #0

## 2023-03-02 NOTE — Plan of Care (Signed)
  Problem: Clinical Measurements: Goal: Will remain free from infection Outcome: Completed/Met Goal: Respiratory complications will improve Outcome: Completed/Met   Problem: Activity: Goal: Risk for activity intolerance will decrease Outcome: Completed/Met   Problem: Nutrition: Goal: Adequate nutrition will be maintained Outcome: Completed/Met   Problem: Coping: Goal: Level of anxiety will decrease Outcome: Completed/Met   Problem: Elimination: Goal: Will not experience complications related to bowel motility Outcome: Completed/Met Goal: Will not experience complications related to urinary retention Outcome: Completed/Met   Problem: Pain Managment: Goal: General experience of comfort will improve and/or be controlled Outcome: Completed/Met   Problem: Safety: Goal: Ability to remain free from injury will improve Outcome: Completed/Met   Problem: Skin Integrity: Goal: Risk for impaired skin integrity will decrease Outcome: Completed/Met

## 2023-03-02 NOTE — TOC Transition Note (Signed)
Transition of Care [facility]) - Discharge Note   Patient Details  Name: Colin [name] MRN: [id number] Date of Birth: [birth year]  Transition of Care [facility]) CM/SW Contact:  Lawerance Sabal, RN Phone Number: 03/02/2023, 12:14 PM   Clinical Narrative:      Notified [facility] of DC    Barriers to Discharge: Continued Medical Work up   Patient Goals and CMS Choice Patient states their goals for this hospitalization and ongoing recovery are:: return home with son   Choice offered to / list presented to : NA      Discharge Placement                       Discharge Plan and Services Additional resources added to the After Visit Summary for   In-house Referral: NA Discharge Planning Services: CM Consult Post Acute Care Choice: NA          DME Arranged: N/A DME Agency: NA       HH Arranged: RN, Disease Management HH Agency: [facility] Date HH Agency Contacted: 02/28/23 Time HH Agency Contacted: 1454 Representative spoke with at [facility] Agency: Tresa Endo  Social Drivers of Health (SDOH) Interventions SDOH Screenings   Food Insecurity: No Food Insecurity (02/26/2023)  Housing: Low Risk  (12/30/2022)  Transportation Needs: No Transportation Needs (02/26/2023)  Utilities: Not At Risk (02/26/2023)  Depression (PHQ2-9): High Risk (01/11/2022)  Tobacco Use: Medium Risk (02/25/2023)     Readmission Risk Interventions    01/01/2023   12:57 PM 12/07/2022    4:35 PM  Readmission Risk Prevention Plan  Transportation Screening Complete Complete  PCP or Specialist Appt within 5-7 Days  Complete  PCP or Specialist Appt within 3-5 Days Complete   Home Care Screening  Complete  Medication Review (RN CM)  Complete  HRI or Home Care Consult Complete   Palliative Care Screening Not Applicable   Medication Review (RN Care Manager) Complete

## 2023-03-02 NOTE — Discharge Summary (Addendum)
Physician Discharge Summary   Patient: Colin [name] MRN: [id number] DOB: 11-02-1950  Admit date:     02/25/2023  Discharge date: 03/02/23  Discharge Physician: York Ram Damieon Armendariz   PCP: Grayce Sessions, NP   Recommendations at discharge:    Furosemide has been increased to 60 mg po daily, and placed on Bidil ( changed from separate hydralazine isosorbide). Limited guideline directed medical therapy due to acutely low GFR. Considering proteinuria, plan to assess patient for SGLT 2 inh or ARB as outpatient when renal function more stable.  Refilled all his medications.  Follow up renal function and electrolytes in 7 days.  Follow up with Gwinda Passe NP in 7 to 10 days.  Follow up with Cardiology as scheduled.   I spoke with patient's son over the phone, we talked in detail about patient's condition, plan of care and prognosis and all questions were addressed.   Discharge Diagnoses: Principal Problem:   Acute on chronic systolic CHF (congestive heart failure) (HCC) Active Problems:   CKD (chronic kidney disease) stage 4, GFR 15-29 ml/min (HCC)   Type 2 diabetes mellitus with hyperlipidemia (HCC)   Coronary artery disease   Essential hypertension   Hypothyroidism  Resolved Problems:   * No resolved hospital problems. [facility] Course: Colin [name] was admitted to the hospital with the working diagnosis of heart failure exacerbation.   73/M w CAD/CABG, CHF, EF 25-30%, type 2 diabetes, CKD 4 and hypertension presented to the ED with shortness of breath and edema. Patient's son reports that he ran out of meds few weeks ago and has been unable to obtain refills. Off medications he developed worsening edema and progressive dyspnea, to the point he was brought to the ED for further evaluation.  On his initial physical examination his blood pressure was 172/78. HR 65, RR 25 and 02 saturation 96%.  Lungs with rales bilaterally with no wheezing or rhonchi, heart with S1 and S2  present and regular with no gallops, or rubs, positive systolic murmur at the apex, abdomen with no distention and positive lower extremity edema.   Na 138, K 3,2 CL 105 bicarbonate 25 glucose 311, BUN 18 cr 2,76 BNP 3,916  High sensitive troponin 14 and 15  Wbc 6,8 hgb 13,0 plt 282  Urine analysis SG 1,005, protein 100, small hgb and no leukocytes.  Protein/ cr 7,42   Chest radiograph with cardiomegaly, bilateral hilar vascular congestion, with left pleural effusion, moderate to large.   EKG HR 77 bpm, right axis deviation, normal intervals, sinus rhythm, q waves V1 to V3, with no significant ST segment or T wave changes.   Patient was placed on IV furosemide for diuresis.   01/22 left thoracentesis 700 ml fluid darined.  01/23 responding to diuresis Add ted hose  01/24 clinically improving edema, renal function not yet stable.  01/25 renal function has stabilized at 3,15,. Plan for discharge home and follow up as outpatient.  New refills for his medications.    Assessment and Plan: * Acute on chronic systolic CHF (congestive heart failure) (HCC) Echocardiogram with reduced LV systolic function with EF 30 to 35%, global hypokinesis, LV internal cavity size is dilatated. RV systolic function with moderate reduction, LA with moderate dilatation, moderate to severe mitral regurgitation,   Patient was placed on IV furosemide, negative fluid balance was achieved, - 11,465 ml, with significant improvement in his symptoms.   01/22  IV albumin 25 g   Plan to continue diuresis with furosemide 60  mg po daily.  After load reduction with hydralazine and isosorbide. Ted hose   Cardiogenic pulmonary edema with bilateral pleural effusions,  Sp left thoracentesis, 700 cc, transudate fluid per Light's criteria.  Oxygenation improved today 02 saturation is 94 to 96% on room air.   CKD (chronic kidney disease) stage 4, GFR 15-29 ml/min (HCC) AKI, Hypokalemia, Proteinuria and hypoalbuminemia    Renal function with serum cr at 3,15 with K at 4,1 and serum bicarbonate at 32  Na 138 Mg 2.3   Transitioned to oral furosemide Considering proteinuria with protein creatinine ration of 7,42, consider ARB, or SGLT 2 inh to start as outpatient when serum cr more stable.   Type 2 diabetes mellitus with hyperlipidemia (HCC) Hypoglycemia.   Patient was placed on low dose insulin for glucose control. At home will continue basal insulin at a reduced dose.   Coronary artery disease SP CABG  No acute coronary syndrome.   Essential hypertension Continue blood pressure control with Bidil.   Hypothyroidism Continue levothyroxine.          Consultants: cardiology and IR  Procedures performed: left thoracentesis   Disposition: Home Diet recommendation:  Discharge Diet Orders (From admission, onward)     Start     Ordered   03/02/23 0000  Diet - low sodium heart healthy        03/02/23 1159           Cardiac and Carb modified diet DISCHARGE MEDICATION: Allergies as of 03/02/2023   No Known Allergies      Medication List     STOP taking these medications    hydrALAZINE 25 MG tablet Commonly known as: APRESOLINE   isosorbide mononitrate 60 MG 24 hr tablet Commonly known as: IMDUR       TAKE these medications    atorvastatin 80 MG tablet Commonly known as: LIPITOR Take 1 tablet (80 mg total) by mouth daily.   carvedilol 6.25 MG tablet Commonly known as: COREG Take 1 tablet (6.25 mg total) by mouth 2 (two) times daily with a meal.   clopidogrel 75 MG tablet Commonly known as: Plavix Take 1 tablet (75 mg total) by mouth daily.   FreeStyle [place] 3 Reader [place] Use as directed!   FreeStyle Libre 3 Sensor Misc Use as instructed change every 14 days   furosemide 20 MG tablet Commonly known as: LASIX Take 3 tablets (60 mg total) by mouth daily. Start taking on: March 03, 2023 What changed:  medication strength how much to take   insulin glargine  100 UNIT/ML Solostar Pen Commonly known as: LANTUS Inject 8 Units into the skin at bedtime. What changed: how much to take   insulin lispro 100 UNIT/ML KwikPen Commonly known as: Admelog SoloStar Inject 3 Units into the skin 3 (three) times daily with meals.   isosorbide-hydrALAZINE 20-37.5 MG tablet Commonly known as: BIDIL Take 1 tablet by mouth 3 (three) times daily.   levothyroxine 25 MCG tablet Commonly known as: SYNTHROID Take 1 tablet (25 mcg total) by mouth daily at 6 (six) AM.   nitroGLYCERIN 0.4 MG SL tablet Commonly known as: NITROSTAT Place 1 tab (0.4mg  total) under the tongue every 5 minutes as needed for chest pain   TechLite Pen Needles 31G X 5 MM Misc Generic drug: Insulin Pen Needle Uses three times daily with insulin.   Insupen Pen Needles 32G X 4 MM Misc Generic drug: Insulin Pen Needle Use as directed with Humalog and Lantus pens.  Follow-up Information     [facility].   Specialty: Home Health Services Why: Agency will call you to set up apt times. Contact information: [address] [place] [address] [phone]                Discharge Exam: Ceasar Mons Weights   02/28/23 0506 03/01/23 0503 03/02/23 0325  Weight: 66.2 kg 64.1 kg 58.7 kg   BP (!) 111/55 (BP Location: Left Arm)   Pulse 60   Temp 98.7 F (37.1 C) (Oral)   Resp 20   Ht 5\' 2"  (1.575 m)   Wt 58.7 kg   SpO2 91%   BMI 23.67 kg/m   Patient is feeling better, no dyspnea, no chest pain, no edema, PND or orthopnea.   Neurology awake and alert ENT with mild pallor Cardiovascular with S1 and S2 present and regular with no gallops or rubs, positive systolic murmur at the apex No JVD Respiratory with no rales or wheezing, no rhonchi Abdomen with no distention   Condition at discharge: stable  The results of significant diagnostics from this hospitalization (including imaging, microbiology, ancillary and laboratory) are listed below  for reference.   Imaging Studies: IR THORACENTESIS ASP PLEURAL SPACE W/IMG GUIDE Result Date: 02/27/2023 INDICATION: 73 year old male with acute on chronic heart failure who ran out of his medications 2 weeks ago presenting with shortness of breath and volume overload. Bilateral pleural effusions with left greater than right. Request for diagnostic and therapeutic thoracentesis. EXAM: ULTRASOUND GUIDED left THORACENTESIS MEDICATIONS: 1% lidocaine, 8 mL. COMPLICATIONS: None immediate. PROCEDURE: An ultrasound guided thoracentesis was thoroughly discussed with the patient and questions answered. The benefits, risks, alternatives and complications were also discussed. The patient understands and wishes to proceed with the procedure. Written consent was obtained. Ultrasound was performed to localize and mark an adequate pocket of fluid in the left chest. The area was then prepped and draped in the normal sterile fashion. 1% Lidocaine was used for local anesthesia. Under ultrasound guidance a 6 Fr Safe-T-Centesis catheter was introduced. Thoracentesis was performed. The catheter was removed and a dressing applied. FINDINGS: A total of approximately 700 mL of hazy yellow fluid was removed. Samples were sent to the laboratory as requested by the clinical team. IMPRESSION: Successful ultrasound guided left thoracentesis yielding 700 mL of pleural fluid. Procedure performed by: Sherlene Shams, PA-C Electronically Signed   By: Marliss Coots M.D.   On: 02/27/2023 14:35   DG Chest 1 View Result Date: 02/27/2023 CLINICAL DATA:  Status post left thoracentesis EXAM: CHEST  1 VIEW COMPARISON:  02/25/2023 FINDINGS: Diminished volume of a left pleural effusion, now moderate with correspondingly improved aeration of the left lung base. No pneumothorax. Right lung is normally aerated. Cardiomegaly status post median sternotomy. No acute osseous findings. IMPRESSION: 1. Diminished volume of a left pleural effusion, now moderate  with correspondingly improved aeration of the left lung base. No pneumothorax. 2. Cardiomegaly. Electronically Signed   By: Jearld Lesch M.D.   On: 02/27/2023 09:16   US Abdomen Limited RUQ (LIVER/GB) Result Date: 02/26/2023 CLINICAL DATA:  Liver disease. EXAM: ULTRASOUND ABDOMEN LIMITED RIGHT UPPER QUADRANT COMPARISON:  CT 11/23/2022. FINDINGS: Gallbladder: Gallbladder is relatively contracted. There is some wall thickening up to 4 mm but this could relate to level of underdistention. No obvious shadowing stones. If there is further concern of subtle stone formation, a follow up study with prolonged fasting could be obtained for further sensitivity Common bile duct: Diameter:  4 mm Liver: No focal lesion identified. Within normal limits in parenchymal echogenicity. Portal vein is patent on color Doppler imaging with normal direction of blood flow towards the liver. Other: Right-sided pleural effusion. IMPRESSION: No gallstones or ductal dilatation. However the gallbladder is poorly distended. If needed follow up study with prolonged fasting could be considered as clinically appropriate. Pleural effusion Electronically Signed   By: Karen Kays M.D.   On: 02/26/2023 12:49   ECHOCARDIOGRAM COMPLETE Result Date: 02/26/2023    ECHOCARDIOGRAM REPORT   Patient Name:   RAMZEY PETROVIC [facility] Date of Exam: 02/26/2023 Medical Rec #:  [id number]    Height:       62.0 in Accession #:    [id number]   Weight:       132.3 lb Date of Birth:  1950/09/17    BSA:          1.604 m Patient Age:    72 years     BP:           111/58 mmHg Patient Gender: M            HR:           63 bpm. Exam Location:  Inpatient Procedure: 2D Echo, Cardiac Doppler, Color Doppler and Intracardiac            Opacification Agent Indications:    CHF  History:        Patient has prior history of Echocardiogram examinations, most                 recent 11/24/2022. CHF, NSTEMI and CAD, Prior CABG,                 Arrythmias:Bradycardia, Signs/Symptoms:Shortness of  Breath and                 Syncope; Risk Factors:Hypertension.  Sonographer:    Webb Laws Referring Phys: [id number] SAGAR H JINWALA IMPRESSIONS  1. Left ventricular ejection fraction, by estimation, is 30 to 35%. The left ventricle has moderately decreased function. The left ventricle demonstrates global hypokinesis. The left ventricular internal cavity size was mildly dilated. Left ventricular diastolic parameters are consistent with Grade III diastolic dysfunction (restrictive). Elevated left atrial pressure.  2. Right ventricular systolic function is moderately reduced. The right ventricular size is normal. Tricuspid regurgitation signal is inadequate for assessing PA pressure.  3. Left atrial size was mild to moderately dilated.  4. Large pleural effusion in the left lateral region.  5. The mitral valve is normal in structure. Moderate to severe mitral valve regurgitation. No evidence of mitral stenosis.  6. The aortic valve is tricuspid. There is mild calcification of the aortic valve. There is mild thickening of the aortic valve. Aortic valve regurgitation is not visualized. Aortic valve sclerosis/calcification is present, without any evidence of aortic stenosis. Comparison(s): Prior images reviewed side by side. Mitral insufficiency is worse and left heart filling pressures are higher. FINDINGS  Left Ventricle: Left ventricular ejection fraction, by estimation, is 30 to 35%. The left ventricle has moderately decreased function. The left ventricle demonstrates global hypokinesis. Definity contrast agent was given IV to delineate the left ventricular endocardial borders. The left ventricular internal cavity size was mildly dilated. There is no left ventricular hypertrophy. Left ventricular diastolic parameters are consistent with Grade III diastolic dysfunction (restrictive). Elevated left atrial pressure. Right Ventricle: The right ventricular size is normal. No increase in right ventricular wall  thickness. Right ventricular systolic function is moderately reduced. Tricuspid  regurgitation signal is inadequate for assessing PA pressure. Left Atrium: Left atrial size was mild to moderately dilated. Right Atrium: Right atrial size was normal in size. Pericardium: There is no evidence of pericardial effusion. Mitral Valve: The mitral valve is normal in structure. Moderate to severe mitral valve regurgitation, with posteriorly-directed jet. No evidence of mitral valve stenosis. Tricuspid Valve: The tricuspid valve is normal in structure. Tricuspid valve regurgitation is not demonstrated. Aortic Valve: The aortic valve is tricuspid. There is mild calcification of the aortic valve. There is mild thickening of the aortic valve. Aortic valve regurgitation is not visualized. Aortic valve sclerosis/calcification is present, without any evidence of aortic stenosis. Pulmonic Valve: The pulmonic valve was normal in structure. Pulmonic valve regurgitation is trivial. No evidence of pulmonic stenosis. Aorta: The aortic root and ascending aorta are structurally normal, with no evidence of dilitation. IAS/Shunts: No atrial level shunt detected by color flow Doppler. Additional Comments: There is a large pleural effusion in the left lateral region.  LEFT VENTRICLE PLAX 2D LVIDd:         5.72 cm      Diastology LVIDs:         4.72 cm      LV e' medial:    4.03 cm/s LV PW:         1.00 cm      LV E/e' medial:  30.5 LV IVS:        0.70 cm      LV e' lateral:   5.52 cm/s LVOT diam:     1.90 cm      LV E/e' lateral: 22.3 LV SV:         41 LV SV Index:   25 LVOT Area:     2.84 cm  LV Volumes (MOD) LV vol d, MOD A2C: 179.0 ml LV vol d, MOD A4C: 136.0 ml LV vol s, MOD A2C: 114.0 ml LV vol s, MOD A4C: 83.0 ml LV SV MOD A2C:     65.0 ml LV SV MOD A4C:     136.0 ml LV SV MOD BP:      56.3 ml RIGHT VENTRICLE            IVC RV Basal diam:  3.60 cm    IVC diam: 1.70 cm RV S prime:     5.00 cm/s TAPSE (M-mode): 1.3 cm LEFT ATRIUM              Index        RIGHT ATRIUM           Index LA diam:        4.60 cm 2.87 cm/m   RA Area:     10.60 cm LA Vol (A2C):   45.8 ml 28.56 ml/m  RA Volume:   21.00 ml  13.10 ml/m LA Vol (A4C):   49.7 ml 30.99 ml/m LA Biplane Vol: 49.4 ml 30.80 ml/m  AORTIC VALVE LVOT Vmax:   87.00 cm/s LVOT Vmean:  55.500 cm/s LVOT VTI:    0.144 m  AORTA Ao Root diam: 2.90 cm Ao Asc diam:  3.40 cm MITRAL VALVE MV Area (PHT): 5.66 cm       SHUNTS MV Decel Time: 134 msec       Systemic VTI:  0.14 m MR Peak grad:    199.4 mmHg   Systemic Diam: 1.90 cm MR Mean grad:    119.0 mmHg MR Vmax:         706.00 cm/s MR  Vmean:        505.0 cm/s MR PISA:         2.26 cm MR PISA Eff ROA: 11 mm MR PISA Radius:  0.60 cm MV E velocity: 123.00 cm/s MV A velocity: 40.60 cm/s MV E/A ratio:  3.03 Mihai Croitoru MD Electronically signed by Thurmon Fair MD Signature Date/Time: 02/26/2023/12:47:50 PM    Final    US RENAL Result Date: 02/26/2023 CLINICAL DATA:  Chronic kidney disease. EXAM: RENAL / URINARY TRACT ULTRASOUND COMPLETE COMPARISON:  None Available. FINDINGS: Right Kidney: Renal measurements: 10.1 x 4.5 x 4.2 cm = volume: 102 mL. Small cysts measure up to 1.9 cm. Normal echogenicity. No hydronephrosis or shadowing stone. Left Kidney: Renal measurements: 10.0 x 4.5 x 4.9 cm = volume: 115 mL. Several cysts measure up to 4.5 cm. Normal echogenicity. No hydronephrosis or shadowing stone. Bladder: Appears normal for degree of bladder distention. Other: Moderate bilateral pleural effusions are partially visualized. IMPRESSION: 1. Small bilateral renal cysts. No hydronephrosis or shadowing stone. 2. Bilateral pleural effusions. Electronically Signed   By: Elgie Collard M.D.   On: 02/26/2023 09:45   DG Chest Left Decubitus Result Date: 02/25/2023 CLINICAL DATA:  Pleural effusion, short of breath EXAM: CHEST - LEFT DECUBITUS COMPARISON:  02/25/2023 FINDINGS: Left lateral decubitus radiograph of the chest demonstrates moderate left pleural  effusion, which is partially free-flowing. Small right pleural effusion. No pneumothorax. IMPRESSION: 1. Moderate left pleural effusion, which is at least partially free-flowing. 2. Small free-flowing right pleural effusion. Electronically Signed   By: Sharlet Salina M.D.   On: 02/25/2023 18:22   DG Chest 2 View Result Date: 02/25/2023 CLINICAL DATA:  shortness of breath for 1 day. History of diabetes, hypertension. Ex-smoker. EXAM: CHEST - 2 VIEW COMPARISON:  12/30/2022 FINDINGS: Median sternotomy for CABG. Atherosclerosis in the transverse aorta. Numerous leads and wires project over the chest. Midline trachea. Moderate cardiomegaly. A moderate left pleural effusion with possible mild loculation laterally. Tiny right pleural effusion. No pneumothorax. Interstitial edema is moderate, increased. Left-greater-than-right base airspace disease. IMPRESSION: Moderate congestive heart failure. Moderate to large left pleural effusion with possible mild loculation. Small right pleural effusion Left-greater-than-right base airspace disease, most likely atelectasis. At the left lung base, infection cannot be excluded. Aortic Atherosclerosis (ICD10-I70.0). Electronically Signed   By: Jeronimo Greaves M.D.   On: 02/25/2023 15:32    Microbiology: Results for orders placed or performed during the hospital encounter of 02/25/23  Gram stain     Status: None   Collection Time: 02/27/23  8:28 AM   Specimen: Lung, Left; Pleural Fluid  Result Value Ref Range Status   Specimen Description PLEURAL  Final   Special Requests LUNG, LEFT  Final   Gram Stain   Final    NO WBC SEEN NO ORGANISMS SEEN Performed at [facility], [address], [place], [place] [address]    Report Status 02/27/2023 FINAL  Final  Culture, body fluid w Gram Stain-bottle     Status: None (Preliminary result)   Collection Time: 02/27/23  8:28 AM   Specimen: Pleura  Result Value Ref Range Status   Specimen Description PLEURAL  Final    Special Requests LUNG, LEFT  Final   Culture   Final    NO GROWTH 3 DAYS Performed at [facility], [address] [place], [place] [address]    Report Status PENDING  Incomplete    Labs: CBC: Recent Labs  Lab 02/25/23 1325  WBC 6.8  HGB 13.0  HCT 40.2  MCV 92.4  PLT 282   Basic Metabolic Panel: Recent Labs  Lab 02/26/23 1725 02/27/23 0233 02/28/23 0240 03/01/23 0243 03/02/23 0834  NA 143 141 140 140 138  K 4.0 3.2* 4.1 4.2 4.1  CL 104 104 102 101 98  CO2 29 31 33* 31 32  GLUCOSE 242* 155* 139* 187* 179*  BUN 26* 25* 22 28* 30*  CREATININE 2.78* 2.80* 2.86* 3.15* 3.15*  CALCIUM 8.2* 8.1* 8.1* 8.0* 8.3*  MG 1.9  --  2.0 1.9 2.3   Liver Function Tests: Recent Labs  Lab 02/25/23 1555  AST 15  ALT 11  ALKPHOS 71  BILITOT 0.4  PROT 5.2*  ALBUMIN <1.5*   CBG: Recent Labs  Lab 03/01/23 1239 03/01/23 1509 03/01/23 2042 03/02/23 0550 03/02/23 1111  GLUCAP 209* 227* 336* 291* 141*    Discharge time spent: greater than 30 minutes.  Signed: Coralie Keens, MD [facility] 03/02/2023

## 2023-03-02 NOTE — Plan of Care (Signed)
  Problem: Health Behavior/Discharge Planning: Goal: Ability to manage health-related needs will improve Outcome: Progressing   Problem: Metabolic: Goal: Ability to maintain appropriate glucose levels will improve Outcome: Progressing   Problem: Nutritional: Goal: Maintenance of adequate nutrition will improve Outcome: Progressing

## 2023-03-02 NOTE — Progress Notes (Signed)
Nothing new to add - we will follow peripherally

## 2023-03-04 ENCOUNTER — Telehealth: Payer: Self-pay

## 2023-03-04 LAB — CULTURE, BODY FLUID W GRAM STAIN -BOTTLE: Culture: NO GROWTH

## 2023-03-04 NOTE — Transitions of Care (Post Inpatient/ED Visit) (Signed)
   03/04/2023  Name: ANTOINETTE HASKETT MRN: [id number] DOB: [birth year]  Today's TOC FU Call Status: Today's TOC FU Call Status:: Unsuccessful Call (1st Attempt) Unsuccessful Call (1st Attempt) Date: 03/04/23  Attempted to reach the patient regarding the most recent Inpatient/ED visit.  Follow Up Plan: Additional outreach attempts will be made to reach the patient to complete the Transitions of Care (Post Inpatient/ED visit) call.   Signature  Robyne Peers, RN

## 2023-03-05 ENCOUNTER — Telehealth: Payer: Self-pay

## 2023-03-05 NOTE — Transitions of Care (Post Inpatient/ED Visit) (Signed)
   03/05/2023  Name: Colin [name] MRN: [id number] DOB: 20-Oct-1950  Today's TOC FU Call Status: Today's TOC FU Call Status:: Successful TOC FU Call Completed Unsuccessful Call (1st Attempt) Date: 03/04/23 [facility] FU Call Complete Date: 03/05/23 Patient's Name and Date of Birth confirmed.  Transition Care Management Follow-up Telephone Call Date of Discharge: 03/02/23 Discharge Facility: [facility]) Type of Discharge: Inpatient Admission Primary Inpatient Discharge Diagnosis:: acute on chronic systolic CHF How have you been since you were released from the hospital?: Better Any questions or concerns?: No  Items Reviewed: Did you receive and understand the discharge instructions provided?: Yes (He siaod his children assist him with reading the instructions.) Medications obtained,verified, and reconciled?: Partial Review Completed Reason for Partial Mediation Review: He said he has all of his medications and did not need to review the med list and did not have any questions about the medications. He stated that his children help him manage his med regime and he has a working glucometer and is able to check his blood sugars and administer his insulin on his own Any new allergies since your discharge?: No Dietary orders reviewed?: No Do you have support at home?: Yes People in Home: child(ren), adult Name of Support/Comfort Primary Source: He said he lives with his children and grandchildren  Medications Reviewed Today: Medications Reviewed Today   Medications were not reviewed in this encounter     Home Care and Equipment/Supplies: Were Home Health Services Ordered?: Yes Name of Home Health Agency:: [facility] Has Agency set up a time to come to your home?: No EMR reviewed for Home Health Orders: Orders present/patient has not received call (refer to CM for follow-up) (I explained to him that I will call [facility] and check on the status of the referral.) Any new equipment or medical  supplies ordered?: No  Functional Questionnaire: Do you need assistance with bathing/showering or dressing?: No Do you need assistance with meal preparation?: Yes (family assists as needed) Do you need assistance with eating?: No Do you have difficulty maintaining continence: No Do you need assistance with getting out of bed/getting out of a chair/moving?: Yes (amblates with a cane) Do you have difficulty managing or taking your medications?: Yes (family assists as needed)  Follow up appointments reviewed: PCP Follow-up appointment confirmed?: Yes Date of PCP follow-up appointment?: 03/18/23 Follow-up Provider: Gwinda Passe, NP Specialist Hospital Follow-up appointment confirmed?: Yes Date of Specialist follow-up appointment?: 03/07/23 Follow-Up Specialty Provider:: cardiology; 03/15/2023- [facility] Do you need transportation to your follow-up appointment?: No Do you understand care options if your condition(s) worsen?: Yes-patient verbalized understanding    SIGNATURE Robyne Peers, RN

## 2023-03-06 NOTE — Progress Notes (Deleted)
  Cardiology Office Note:  .   Date:  03/06/2023  ID:  Colin [name], DOB May 07, 1950, MRN [id number] PCP: Colin Sessions, NP  [facility] Providers Cardiologist:  Truett Mainland, MD PCP: Colin Sessions, NP  No chief complaint on file.     History of Present Illness: .    Colin [name] is a 73 y.o. male with uncontrolled type 2 diabetes mellitus, mixed hyperlipidemia, tobacco dependence, coronary artery disease with prior MI, LCX and RCA stents in 2012, s/p CABG X 3.  LIMA LAD, RSVG PDA, OM1  (08/23/2020), HFrEF   ***Colin [name]  *** [name] was recently hospitalized last week with acute on chronic systolic heart failure.  On discharge, furosemide was increased to 60 mg daily, hydralazine/isosorbide were started.  GDMT was limited due to renal dysfunction.  Considering proteinuria, he was recommended to reassess [name] for SGLT2 inhibitor, or ARB as outpatient, when renal function will is more stable.  ***  There were no vitals filed for this visit.   ROS: *** ROS   Studies Reviewed: .        *** Independently interpreted 03/02/2023: HbA1C 11.6% Hb 13 Cr 3.15, eGFR 20 TSH 8.5  11/2022: Chol 384, TG 86, HDL 44, LDL 323  Echocardiogram 02/26/2023: Mild LV dilatation.  Global hypokinesis.  EF 30-35%.  Grade 3 diastolic dysfunction. Moderate RV systolic dysfunction. Mild to moderate LA dilatation. Moderate to severe mitral regurgitation. Large pleural effusion. Compared to prior echocardiogram in 11/2022, mitral regurgitation is worse..  Pressures are higher.   Risk Assessment/Calculations:   {Does this [name] have ATRIAL FIBRILLATION?:[phone]}     Physical Exam:   Physical Exam   VISIT DIAGNOSES: No diagnosis found.   ASSESSMENT AND PLAN: .    Colin [name] is a 73 y.o. male with uncontrolled type 2 diabetes mellitus, mixed hyperlipidemia, tobacco dependence, coronary artery disease with prior MI, LCX and RCA stents  in 2012, s/p CABG X 3.  LIMA LAD, RSVG PDA, OM1  (08/23/2020), HFrEF   Left abdominal pain: Likely musculoskeletal.  Okay to take 1-2 Tylenol pills for now. Recommend f/u w/PCP.    HFrEF: Clinically appears euvolemic. [name] does not have his medications or his medicatio  list with him today but says that he will be running out of medications in a week or so.  Will check labs and refill medications based on the results.    CAD: No recurrent angina symptoms at this time.   Continue aspirin, statin and current anti anginal therapy.     {Are you ordering a CV Procedure (e.g. stress test, cath, DCCV, TEE, etc)?   Press F2        :[id number]}    No orders of the defined types were placed in this encounter.    F/u in ***  Signed, [name], MD

## 2023-03-07 ENCOUNTER — Ambulatory Visit: Payer: 59 | Attending: Cardiology | Admitting: Cardiology

## 2023-03-07 DIAGNOSIS — I2510 Atherosclerotic heart disease of native coronary artery without angina pectoris: Secondary | ICD-10-CM

## 2023-03-07 DIAGNOSIS — E782 Mixed hyperlipidemia: Secondary | ICD-10-CM

## 2023-03-07 DIAGNOSIS — E1165 Type 2 diabetes mellitus with hyperglycemia: Secondary | ICD-10-CM

## 2023-03-07 DIAGNOSIS — I5020 Unspecified systolic (congestive) heart failure: Secondary | ICD-10-CM

## 2023-03-07 NOTE — Telephone Encounter (Signed)
I spoke to [facility] and she confirmed that they have the referral and they left a message for patient requesting a call back so they can arrange the start of care.  She also confirmed that they have a language line available.

## 2023-03-08 ENCOUNTER — Encounter: Payer: Self-pay | Admitting: Cardiology

## 2023-03-14 ENCOUNTER — Telehealth (HOSPITAL_COMMUNITY): Payer: Self-pay | Admitting: Cardiology

## 2023-03-15 ENCOUNTER — Ambulatory Visit (HOSPITAL_COMMUNITY)
Admit: 2023-03-15 | Discharge: 2023-03-15 | Disposition: A | Discharge status: Home / Self Care | Payer: 59 | Source: Ambulatory Visit | Attending: Cardiology | Admitting: Cardiology

## 2023-03-15 ENCOUNTER — Encounter (HOSPITAL_COMMUNITY): Payer: Self-pay | Admitting: Cardiology

## 2023-03-15 VITALS — BP 148/68 | HR 68 | Ht 62.0 in | Wt 125.6 lb

## 2023-03-15 DIAGNOSIS — Z951 Presence of aortocoronary bypass graft: Secondary | ICD-10-CM | POA: Insufficient documentation

## 2023-03-15 DIAGNOSIS — I5022 Chronic systolic (congestive) heart failure: Secondary | ICD-10-CM | POA: Diagnosis not present

## 2023-03-15 DIAGNOSIS — Z955 Presence of coronary angioplasty implant and graft: Secondary | ICD-10-CM | POA: Diagnosis not present

## 2023-03-15 DIAGNOSIS — Z7902 Long term (current) use of antithrombotics/antiplatelets: Secondary | ICD-10-CM | POA: Insufficient documentation

## 2023-03-15 DIAGNOSIS — I130 Hypertensive heart and chronic kidney disease with heart failure and stage 1 through stage 4 chronic kidney disease, or unspecified chronic kidney disease: Secondary | ICD-10-CM | POA: Insufficient documentation

## 2023-03-15 DIAGNOSIS — Z79899 Other long term (current) drug therapy: Secondary | ICD-10-CM | POA: Insufficient documentation

## 2023-03-15 DIAGNOSIS — R627 Adult failure to thrive: Secondary | ICD-10-CM

## 2023-03-15 DIAGNOSIS — I252 Old myocardial infarction: Secondary | ICD-10-CM | POA: Diagnosis not present

## 2023-03-15 DIAGNOSIS — I2510 Atherosclerotic heart disease of native coronary artery without angina pectoris: Secondary | ICD-10-CM | POA: Diagnosis not present

## 2023-03-15 DIAGNOSIS — I5043 Acute on chronic combined systolic (congestive) and diastolic (congestive) heart failure: Secondary | ICD-10-CM | POA: Diagnosis not present

## 2023-03-15 DIAGNOSIS — N184 Chronic kidney disease, stage 4 (severe): Secondary | ICD-10-CM

## 2023-03-15 DIAGNOSIS — I5023 Acute on chronic systolic (congestive) heart failure: Secondary | ICD-10-CM

## 2023-03-15 DIAGNOSIS — Z87891 Personal history of nicotine dependence: Secondary | ICD-10-CM | POA: Diagnosis not present

## 2023-03-15 DIAGNOSIS — E785 Hyperlipidemia, unspecified: Secondary | ICD-10-CM | POA: Diagnosis not present

## 2023-03-15 DIAGNOSIS — E1122 Type 2 diabetes mellitus with diabetic chronic kidney disease: Secondary | ICD-10-CM | POA: Insufficient documentation

## 2023-03-15 NOTE — Patient Instructions (Addendum)
 Good to see you today!  Your physician recommends that you schedule a follow-up appointment in: 4 weeks  If you have any questions or concerns before your next appointment please send us  a message through [place] or call our office at [phone].    TO LEAVE A MESSAGE FOR THE NURSE SELECT OPTION 2, PLEASE LEAVE A MESSAGE INCLUDING: YOUR NAME DATE OF BIRTH CALL BACK NUMBER REASON FOR CALL**this is important as we prioritize the call backs  YOU WILL RECEIVE A CALL BACK THE SAME DAY AS LONG AS YOU CALL BEFORE 4:00 PM  At the [facility], you and your health needs are our priority. As part of our continuing mission to provide you with exceptional heart care, we have created designated Provider Care Teams. These Care Teams include your primary Cardiologist (physician) and Advanced Practice Providers (APPs- Physician Assistants and Nurse Practitioners) who all work together to provide you with the care you need, when you need it.   You may see any of the following providers on your designated Care Team at your next follow up: Dr Toribio Fuel Dr Ezra Shuck Dr. Ria Commander Dr. Morene Brownie Amy Lenetta, NP Caffie Shed, [place] [facility] [place], [place] Beckey Coe, NP Jordan Lee, NP Tinnie Redman, PharmD   Please be sure to bring in all your medications bottles to every appointment.    Thank you for choosing  [facility]

## 2023-03-18 ENCOUNTER — Inpatient Hospital Stay (INDEPENDENT_AMBULATORY_CARE_PROVIDER_SITE_OTHER): Payer: 59 | Admitting: Primary Care

## 2023-03-18 NOTE — Progress Notes (Signed)
   ADVANCED HEART FAILURE FOLLOW UP CLINIC NOTE  Referring Physician: Celestia Rosaline SQUIBB, NP  Primary Care: Celestia Rosaline SQUIBB, NP Primary Cardiologist:  HPI: Colin [name] is a 73 y.o. male with PMH of  CAD with prior MI, LCX and RCA stents in 2012, s/p CABG X 3. 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      Colin [name] presented to the ED 10/18 with SOB, orthopnea and BLE edema and was admitted for volume overload, worsening renal function, and overall failure to thrive. He was diuresed with some improvement in his renal function, and appetite, but remained fatigued. CVP and Coox were not suggestive or volume overload or low output heart failure.      SUBJECTIVE: Recently admitted for volume overload, improved with increasing diuretics.  Appears much more alert and reports his symptoms have improved since his last visit.  PMH, current medications, allergies, social history, and family history reviewed in epic.  PHYSICAL EXAM: Vitals:   03/15/23 1530  BP: (!) 148/68  Pulse: 68  SpO2: 95%   GENERAL: Chronically ill-appearing HEENT: The mucous membranes are pink and moist.   PULM:  Normal work of breathing, clear to auscultation bilaterally. Respirations are unlabored.  CARDIAC:  JVP: Not elevated         Normal rate with regular rhythm. No murmurs, rubs or gallops.  Trace edema.  ABDOMEN: Soft, non-tender, non-distended. NEUROLOGIC: Patient is oriented x3 with no focal or lateralizing neurologic deficits.      ASSESSMENT & PLAN:  Failure to thrive: Improved today, long-term prognosis is still poor. - Poor dialysis candidate, not an advanced therapies candidate   Chronic systolic heart failure: Volume status appears significantly improved on current regimen, will continue 60 mg Lasix  daily with additional p.m. dose as  needed. -Continue 1 tab BiDil  3 times daily - Holding diuretics currently given poor PO intake   CAD:  - Continue plavix , atorvastatin  80mg  daily   HLD: Severely elevated LDL, if significant improvement in function would likely require a PCSK9, but worry that between his renal function and severe heart failure is most likely heading towards a more palliative route.   Hypertension: Had not yet taken afternoon medications. - Continue current therapy  CKDIV: Stable on most recent set of labs. - Complicates GDMT therapy.    Morene Brownie, MD [facility] Mechanical Circulatory Support 03/18/23

## 2023-04-09 ENCOUNTER — Telehealth (HOSPITAL_COMMUNITY): Payer: Self-pay | Admitting: *Deleted

## 2023-04-09 NOTE — Telephone Encounter (Signed)
 Called patient and left message reminding patient of Heart Failure Clinic appointment tomorrow at 2:40 to see Dr. Elwyn Lade. Asked him to call [phone] if he cannot make this appointment.

## 2023-04-10 ENCOUNTER — Encounter (HOSPITAL_COMMUNITY): Payer: 59 | Admitting: Cardiology

## 2023-05-15 ENCOUNTER — Emergency Department (HOSPITAL_COMMUNITY)

## 2023-05-15 ENCOUNTER — Emergency Department (HOSPITAL_COMMUNITY)
Admission: EM | Admit: 2023-05-15 | Discharge: 2023-05-15 | Disposition: A | Discharge status: Home / Self Care | Attending: Emergency Medicine | Admitting: Emergency Medicine

## 2023-05-15 ENCOUNTER — Encounter (HOSPITAL_COMMUNITY): Payer: Self-pay | Admitting: Emergency Medicine

## 2023-05-15 DIAGNOSIS — I2510 Atherosclerotic heart disease of native coronary artery without angina pectoris: Secondary | ICD-10-CM | POA: Insufficient documentation

## 2023-05-15 DIAGNOSIS — I509 Heart failure, unspecified: Secondary | ICD-10-CM | POA: Diagnosis not present

## 2023-05-15 DIAGNOSIS — R0602 Shortness of breath: Secondary | ICD-10-CM | POA: Diagnosis present

## 2023-05-15 DIAGNOSIS — E1165 Type 2 diabetes mellitus with hyperglycemia: Secondary | ICD-10-CM | POA: Diagnosis not present

## 2023-05-15 DIAGNOSIS — R739 Hyperglycemia, unspecified: Secondary | ICD-10-CM

## 2023-05-15 DIAGNOSIS — Z720 Tobacco use: Secondary | ICD-10-CM | POA: Insufficient documentation

## 2023-05-15 DIAGNOSIS — Z794 Long term (current) use of insulin: Secondary | ICD-10-CM | POA: Diagnosis not present

## 2023-05-15 LAB — BASIC METABOLIC PANEL WITH GFR
Anion gap: 8 (ref 5–15)
BUN: 23 mg/dL (ref 8–23)
CO2: 21 mmol/L — ABNORMAL LOW (ref 22–32)
Calcium: 8.2 mg/dL — ABNORMAL LOW (ref 8.9–10.3)
Chloride: 108 mmol/L (ref 98–111)
Creatinine, Ser: 2.56 mg/dL — ABNORMAL HIGH (ref 0.61–1.24)
GFR, Estimated: 26 mL/min — ABNORMAL LOW (ref >60)
Glucose, Bld: 380 mg/dL — ABNORMAL HIGH (ref 70–99)
Potassium: 3.7 mmol/L (ref 3.5–5.1)
Sodium: 137 mmol/L (ref 135–145)

## 2023-05-15 LAB — CBC
HCT: 40.7 % (ref 39.0–52.0)
Hemoglobin: 13 g/dL (ref 13.0–17.0)
MCH: 29.9 pg (ref 26.0–34.0)
MCHC: 31.9 g/dL (ref 30.0–36.0)
MCV: 93.6 fL (ref 80.0–100.0)
Platelets: 284 10*3/uL (ref 150–400)
RBC: 4.35 MIL/uL (ref 4.22–5.81)
RDW: 14.4 % (ref 11.5–15.5)
WBC: 6.5 10*3/uL (ref 4.0–10.5)
nRBC: 0 % (ref 0.0–0.2)

## 2023-05-15 LAB — BRAIN NATRIURETIC PEPTIDE: B Natriuretic Peptide: 4299.6 pg/mL — ABNORMAL HIGH (ref 0.0–100.0)

## 2023-05-15 LAB — TROPONIN I (HIGH SENSITIVITY)
Troponin I (High Sensitivity): 18 ng/L — ABNORMAL HIGH (ref <18)
Troponin I (High Sensitivity): 19 ng/L — ABNORMAL HIGH (ref <18)

## 2023-05-15 LAB — CBG MONITORING, ED: Glucose-Capillary: 302 mg/dL — ABNORMAL HIGH (ref 70–99)

## 2023-05-15 MED ORDER — FUROSEMIDE 10 MG/ML IJ SOLN
40.0000 mg | Freq: Once | INTRAMUSCULAR | Status: AC
  Administered 2023-05-15: 40 mg via INTRAVENOUS
  Filled 2023-05-15: qty 4

## 2023-05-15 MED ORDER — INSULIN ASPART 100 UNIT/ML IJ SOLN
10.0000 [IU] | Freq: Once | INTRAMUSCULAR | Status: AC
  Administered 2023-05-15: 10 [IU] via SUBCUTANEOUS

## 2023-05-15 MED ORDER — POTASSIUM CHLORIDE CRYS ER 20 MEQ PO TBCR: 20.0000 meq | EXTENDED_RELEASE_TABLET | Freq: Two times a day (BID) | ORAL | 0 refills | Status: DC

## 2023-05-15 MED ORDER — FUROSEMIDE 20 MG PO TABS: 60.0000 mg | ORAL_TABLET | Freq: Every day | ORAL | 0 refills | Status: DC

## 2023-05-15 NOTE — ED Triage Notes (Signed)
 Pt here from home with c/o sob with a hx of chf and noncompliant with meds, all info obtained from ems , pt is non english speaking

## 2023-05-15 NOTE — ED Provider Notes (Signed)
 Accepted handoff at shift change from [facility]. Please see prior provider note for more detail.   Briefly: Patient is 73 y.o. with CHF presenting for CHF.   DDX: concern for CHF exacerbation, copd, pna, ptx, pe   Plan: Follow-up on second Trop.  Likely discharge if no delta and can ambulate without hypoxia  Physical Exam  BP (!) 166/76 (BP Location: Right Arm)   Pulse 68   Temp 98.9 F (37.2 C) (Oral)   Resp 19   SpO2 97%   Physical Exam  Procedures  Procedures  ED Course / MDM   Clinical Course as of 05/15/23 2227  Wed May 15, 2023  1502 CHF ex. Has been off his lasix d/t lack of refill. Not hypoxic. But trop elevated. 2nd trop pending. If flat, can go home with lasix Rx.  [JR]    Clinical Course User Index [JR] Gareth Eagle, PA-C   Medical Decision Making Amount and/or Complexity of Data Reviewed Labs: ordered. Radiology: ordered. ECG/medicine tests: ordered.  Risk Prescription drug management.   On reassessment, patient stated he was feeling much better. He did well with ambulation did say he was mildly short of breath but he was not tachypneic or hypoxic. States he voided approximately 2-1/2 L.  Patient states he was feeling much better and expressed a strong desire to leave.  Sent Lasix to his pharmacy along with 10 day potassium supplement.  His son assured me that he would be able to take pick up his medications after discharge.  Discussed return precautions.  Advised to follow-up with PCP and his cardiologist.  Discharged in good condition.    Gareth Eagle, PA-C 05/15/23 2238    Glyn Ade, MD 05/16/23 1455

## 2023-05-15 NOTE — ED Provider Notes (Signed)
 [place] EMERGENCY DEPARTMENT AT [facility] Provider Note   CSN: [id number] Arrival date & time: 05/15/23  1009     History  Chief Complaint  Patient presents with   Shortness of Breath    Codee JVION TURGEON is a 73 y.o. male.  The history is provided by the patient, the EMS personnel and medical records. The history is limited by a language barrier. No language interpreter was used.  Shortness of Breath    73 year old male Montagn[place] speaking, brought here via EMS from home for evaluation of shortness of breath.  Patient does have significant history of CHF, diabetes, CAD, tobacco use.  He is currently on Plavix.  I was able to communicate with patient in [place]  Patient endorsed progressive worsening shortness of breath ongoing for the past month.  He noticed increased trouble breathing at nighttime.  He reported felt similar to prior CHF exacerbation.  He attributed to not having his medication for the past month as he does not have a refill.  Patient denies having fever chills productive cough chest pain hemoptysis abdominal pain.  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  This is not new.  He reportedly quit alcohol or tobacco use for more than a year.  Home Medications Prior to Admission medications   Medication Sig Start Date End Date Taking? Authorizing Provider  atorvastatin (LIPITOR) 80 MG tablet Take 1 tablet (80 mg total) by mouth daily. 03/02/23   Arrien, York Ram, MD  carvedilol (COREG) 6.25 MG tablet Take 1 tablet (6.25 mg total) by mouth 2 (two) times daily with a meal. 03/02/23 04/01/23  Arrien, York Ram, MD  clopidogrel (PLAVIX) 75 MG tablet Take 1 tablet (75 mg total) by mouth daily. 03/02/23   Arrien, York Ram, MD  Continuous Glucose Receiver (FREESTYLE LIBRE 3 READER) DEVI Use as directed! 02/06/22   Reather Littler, MD  Continuous Glucose Sensor (FREESTYLE LIBRE 3 SENSOR) MISC Use as instructed change every 14 days 08/01/22    Altamese [place]le[place], MD  furosemide (LASIX) 20 MG tablet Take 3 tablets (60 mg total) by mouth daily. 03/03/23   Arrien, York Ram, MD  insulin glargine (LANTUS) 100 UNIT/ML Solostar Pen Inject 8 Units into the skin at bedtime. 03/02/23 04/08/23  Arrien, York Ram, MD  insulin lispro (ADMELOG SOLOSTAR) 100 UNIT/ML KwikPen Inject 3 Units into the skin 3 (three) times daily with meals. 03/02/23   Arrien, York Ram, MD  Insulin Pen Needle (PEN NEEDLES 3/16") 31G X 5 MM MISC Uses three times daily with insulin. 05/24/22   Shamleffer, Konrad Dolores, MD  Insulin Pen Needle 32G X 4 MM MISC Use as directed with Humalog and Lantus pens. 12/08/22   Arrien, York Ram, MD  isosorbide-hydrALAZINE (BIDIL) 20-37.5 MG tablet Take 1 tablet by mouth 3 (three) times daily. 03/02/23   Arrien, York Ram, MD  levothyroxine (SYNTHROID) 25 MCG tablet Take 1 tablet (25 mcg total) by mouth daily at 6 (six) AM. 03/02/23 04/01/23  Arrien, York Ram, MD  nitroGLYCERIN (NITROSTAT) 0.4 MG SL tablet Place 1 tab (0.4mg  total) under the tongue every 5 minutes as needed for chest pain 02/14/22   Patwardhan, Anabel Bene, MD      Allergies    Patient has no known allergies.    Review of Systems   Review of Systems  Respiratory:  Positive for shortness of breath.   All other systems reviewed and are negative.   Physical Exam Updated Vital Signs BP (!) 170/92 (  BP Location: Right Arm)   Pulse 77   Temp 97.6 F (36.4 C) (Oral)   Resp 16   SpO2 100%  Physical Exam Constitutional:      General: He is not in acute distress.    Appearance: He is well-developed.  HENT:     Head: Atraumatic.  Eyes:     Conjunctiva/sclera: Conjunctivae normal.  Neck:     Comments: No JVD Cardiovascular:     Rate and Rhythm: Normal rate and regular rhythm.  Pulmonary:     Breath sounds: Rales present.     Comments: Decreased breath sounds with faint crackles heard Abdominal:     Palpations: Abdomen is soft.   Musculoskeletal:     Cervical back: Normal range of motion and neck supple.     Right lower leg: No edema.  Skin:    Findings: No rash.  Neurological:     Mental Status: He is alert.     ED Results / Procedures / Treatments   Labs (all labs ordered are listed, but only abnormal results are displayed) Labs Reviewed  BASIC METABOLIC PANEL WITH GFR - Abnormal; Notable for the following components:      Result Value   CO2 21 (*)    Glucose, Bld 380 (*)    Creatinine, Ser 2.56 (*)    Calcium 8.2 (*)    GFR, Estimated 26 (*)    All other components within normal limits  BRAIN NATRIURETIC PEPTIDE - Abnormal; Notable for the following components:   B Natriuretic Peptide 4,299.6 (*)    All other components within normal limits  TROPONIN I (HIGH SENSITIVITY) - Abnormal; Notable for the following components:   Troponin I (High Sensitivity) 18 (*)    All other components within normal limits  CBC    EKG None ED ECG REPORT   Date: 05/15/2023  Rate: 74  Rhythm: normal sinus rhythm  QRS Axis: left  Intervals: PR prolonged  ST/T Wave abnormalities: normal  Conduction Disutrbances:left bundle branch block  Narrative Interpretation: prolonged QT  Old EKG Reviewed: unchanged  I have personally reviewed the EKG tracing and agree with the computerized printout as noted.   Radiology DG Chest 2 View Result Date: 05/15/2023 CLINICAL DATA:  COPD.  Shortness of breath EXAM: CHEST - 2 VIEW COMPARISON:  X-ray 02/27/2023 FINDINGS: Sternal wires. Enlarged cardiopericardial silhouette. Stable moderate left effusion. Tiny right. No pneumothorax or edema. Calcified aorta. IMPRESSION: Postop chest.  Persistent moderate left effusion.  Tiny right. Electronically Signed   By: Karen Kays M.D.   On: 05/15/2023 12:14    Procedures Procedures    Medications Ordered in ED Medications  furosemide (LASIX) injection 40 mg (40 mg Intravenous Given 05/15/23 1426)    ED Course/ Medical Decision  Making/ A&P Clinical Course as of 05/15/23 1508  Wed May 15, 2023  1502 CHF ex. Has been off his lasix d/t lack of refill. Not hypoxic. But trop elevated. 2nd trop pending. If flat, can go home with lasix Rx.  [JR]    Clinical Course User Index [JR] Gareth Eagle, PA-C                                 Medical Decision Making Amount and/or Complexity of Data Reviewed Labs: ordered. Radiology: ordered.  Risk Prescription drug management.   BP (!) 170/92 (BP Location: Right Arm)   Pulse 77   Temp 97.6 F (36.4 C) (  Oral)   Resp 16   SpO2 100%   47:33 PM  73 year old male [place] speaking, brought here via EMS from home for evaluation of shortness of breath.  Patient does have significant history of CHF, diabetes, CAD, tobacco use.  He is currently on Plavix.  I was able to communicate with patient in [place]  Patient endorsed progressive worsening shortness of breath ongoing for the past month.  He noticed increased trouble breathing at nighttime.  He reported felt similar to prior CHF exacerbation.  He attributed to not having his medication for the past month as he does not have a refill.  Patient denies having fever chills productive cough chest pain hemoptysis abdominal pain.  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  This is not new.  He reportedly quit alcohol or tobacco use for more than a year.  Exam notable for faint crackles heard at lung bases, patient has 3+ pitting to bilateral lower extremities extending towards his knees.  No obvious JVD.  He is not tachypneic.  He is resting comfortably.  -Labs ordered, independently viewed and interpreted by me.  Labs remarkable for BNP 4,299 elevated from prior in the setting of not on his lasix.  Lasix given.  Trop 18, will obtain delta trop.  CBG 380, insulin given.  Cr 2.56 improves from prior -The patient was maintained on a cardiac monitor.  I personally viewed and interpreted the cardiac monitored which  showed an underlying rhythm of: NSR -Imaging independently viewed and interpreted by me and I agree with radiologist's interpretation.  Result remarkable for CXR showing persistent left pleural effusion -This patient presents to the ED for concern of sob, this involves an extensive number of treatment options, and is a complaint that carries with it a high risk of complications and morbidity.  The differential diagnosis includes chf exacerbation, copd, pna, ptx, pe -Co morbidities that complicate the patient evaluation includes CHF, DM, CAD -Treatment includes lasix, insulin -Reevaluation of the patient after these medicines showed that the patient improved -PCP office notes or outside notes reviewed -Discussion with attending Dr. Jeraldine Loots -Escalation to admission/observation considered: pt sign out to oncoming team who f/u on delta trop. If stable, then pt can be d/c home with prescription of lasix and outpt f/u        Final Clinical Impression(s) / ED Diagnoses Final diagnoses:  Acute on chronic congestive heart failure, unspecified heart failure type [facility])  Hyperglycemia    Rx / DC Orders ED Discharge Orders     None         Fayrene Helper, PA-C 05/15/23 1514    Gerhard Munch, MD 05/15/23 1627

## 2023-05-15 NOTE — ED Notes (Signed)
 [facility] called to add pt to monitor services.

## 2023-05-15 NOTE — Discharge Instructions (Signed)
 Evaluation was overall reassuring.  I do feel that you were volume overloaded likely due to not taking Lasix.  I sent Lasix to your pharmacy.  Please follow-up with PCP.  If you develop shortness of breath, chest pain, significant swelling in legs or arms or any other concerning symptom please return to the ED for further evaluation.

## 2023-06-02 ENCOUNTER — Emergency Department (HOSPITAL_COMMUNITY)

## 2023-06-02 ENCOUNTER — Inpatient Hospital Stay (HOSPITAL_COMMUNITY)
Admission: EM | Admit: 2023-06-02 | Discharge: 2023-06-06 | DRG: 291 | Disposition: A | Discharge status: Hospice | Attending: Student | Admitting: Student

## 2023-06-02 ENCOUNTER — Other Ambulatory Visit: Payer: Self-pay

## 2023-06-02 ENCOUNTER — Encounter (HOSPITAL_COMMUNITY): Payer: Self-pay

## 2023-06-02 DIAGNOSIS — Z6825 Body mass index (BMI) 25.0-25.9, adult: Secondary | ICD-10-CM

## 2023-06-02 DIAGNOSIS — Z66 Do not resuscitate: Secondary | ICD-10-CM | POA: Diagnosis present

## 2023-06-02 DIAGNOSIS — Z515 Encounter for palliative care: Secondary | ICD-10-CM

## 2023-06-02 DIAGNOSIS — R0603 Acute respiratory distress: Secondary | ICD-10-CM

## 2023-06-02 DIAGNOSIS — R7989 Other specified abnormal findings of blood chemistry: Secondary | ICD-10-CM | POA: Diagnosis not present

## 2023-06-02 DIAGNOSIS — K219 Gastro-esophageal reflux disease without esophagitis: Secondary | ICD-10-CM | POA: Diagnosis present

## 2023-06-02 DIAGNOSIS — E039 Hypothyroidism, unspecified: Secondary | ICD-10-CM

## 2023-06-02 DIAGNOSIS — D631 Anemia in chronic kidney disease: Secondary | ICD-10-CM | POA: Diagnosis present

## 2023-06-02 DIAGNOSIS — J449 Chronic obstructive pulmonary disease, unspecified: Secondary | ICD-10-CM | POA: Diagnosis present

## 2023-06-02 DIAGNOSIS — Z603 Acculturation difficulty: Secondary | ICD-10-CM | POA: Diagnosis present

## 2023-06-02 DIAGNOSIS — I252 Old myocardial infarction: Secondary | ICD-10-CM | POA: Diagnosis not present

## 2023-06-02 DIAGNOSIS — E782 Mixed hyperlipidemia: Secondary | ICD-10-CM | POA: Diagnosis not present

## 2023-06-02 DIAGNOSIS — Z87891 Personal history of nicotine dependence: Secondary | ICD-10-CM

## 2023-06-02 DIAGNOSIS — I255 Ischemic cardiomyopathy: Secondary | ICD-10-CM | POA: Diagnosis present

## 2023-06-02 DIAGNOSIS — Z794 Long term (current) use of insulin: Secondary | ICD-10-CM | POA: Diagnosis not present

## 2023-06-02 DIAGNOSIS — R627 Adult failure to thrive: Secondary | ICD-10-CM | POA: Diagnosis present

## 2023-06-02 DIAGNOSIS — E1122 Type 2 diabetes mellitus with diabetic chronic kidney disease: Secondary | ICD-10-CM | POA: Diagnosis present

## 2023-06-02 DIAGNOSIS — E1165 Type 2 diabetes mellitus with hyperglycemia: Secondary | ICD-10-CM | POA: Diagnosis present

## 2023-06-02 DIAGNOSIS — I130 Hypertensive heart and chronic kidney disease with heart failure and stage 1 through stage 4 chronic kidney disease, or unspecified chronic kidney disease: Secondary | ICD-10-CM | POA: Diagnosis present

## 2023-06-02 DIAGNOSIS — N179 Acute kidney failure, unspecified: Secondary | ICD-10-CM | POA: Diagnosis present

## 2023-06-02 DIAGNOSIS — I5043 Acute on chronic combined systolic (congestive) and diastolic (congestive) heart failure: Secondary | ICD-10-CM | POA: Diagnosis present

## 2023-06-02 DIAGNOSIS — I2510 Atherosclerotic heart disease of native coronary artery without angina pectoris: Secondary | ICD-10-CM

## 2023-06-02 DIAGNOSIS — I340 Nonrheumatic mitral (valve) insufficiency: Secondary | ICD-10-CM | POA: Diagnosis present

## 2023-06-02 DIAGNOSIS — Z79890 Hormone replacement therapy (postmenopausal): Secondary | ICD-10-CM

## 2023-06-02 DIAGNOSIS — J918 Pleural effusion in other conditions classified elsewhere: Secondary | ICD-10-CM | POA: Diagnosis present

## 2023-06-02 DIAGNOSIS — I5023 Acute on chronic systolic (congestive) heart failure: Secondary | ICD-10-CM | POA: Diagnosis not present

## 2023-06-02 DIAGNOSIS — I509 Heart failure, unspecified: Principal | ICD-10-CM

## 2023-06-02 DIAGNOSIS — I160 Hypertensive urgency: Secondary | ICD-10-CM | POA: Diagnosis present

## 2023-06-02 DIAGNOSIS — Z7902 Long term (current) use of antithrombotics/antiplatelets: Secondary | ICD-10-CM

## 2023-06-02 DIAGNOSIS — N184 Chronic kidney disease, stage 4 (severe): Secondary | ICD-10-CM | POA: Diagnosis present

## 2023-06-02 DIAGNOSIS — Z951 Presence of aortocoronary bypass graft: Secondary | ICD-10-CM

## 2023-06-02 DIAGNOSIS — Z79899 Other long term (current) drug therapy: Secondary | ICD-10-CM

## 2023-06-02 DIAGNOSIS — Z7189 Other specified counseling: Secondary | ICD-10-CM | POA: Diagnosis not present

## 2023-06-02 DIAGNOSIS — D649 Anemia, unspecified: Secondary | ICD-10-CM

## 2023-06-02 DIAGNOSIS — Z91148 Patient's other noncompliance with medication regimen for other reason: Secondary | ICD-10-CM

## 2023-06-02 DIAGNOSIS — S80821A Blister (nonthermal), right lower leg, initial encounter: Secondary | ICD-10-CM | POA: Diagnosis present

## 2023-06-02 DIAGNOSIS — I2489 Other forms of acute ischemic heart disease: Secondary | ICD-10-CM | POA: Diagnosis present

## 2023-06-02 DIAGNOSIS — E8770 Fluid overload, unspecified: Secondary | ICD-10-CM

## 2023-06-02 DIAGNOSIS — J90 Pleural effusion, not elsewhere classified: Secondary | ICD-10-CM | POA: Diagnosis present

## 2023-06-02 DIAGNOSIS — E785 Hyperlipidemia, unspecified: Secondary | ICD-10-CM | POA: Diagnosis present

## 2023-06-02 DIAGNOSIS — X58XXXA Exposure to other specified factors, initial encounter: Secondary | ICD-10-CM | POA: Diagnosis present

## 2023-06-02 LAB — GLUCOSE, CAPILLARY
Glucose-Capillary: 305 mg/dL — ABNORMAL HIGH (ref 70–99)
Glucose-Capillary: 230 mg/dL — ABNORMAL HIGH (ref 70–99)
Glucose-Capillary: 150 mg/dL — ABNORMAL HIGH (ref 70–99)
Glucose-Capillary: 102 mg/dL — ABNORMAL HIGH (ref 70–99)

## 2023-06-02 LAB — URINALYSIS, ROUTINE W REFLEX MICROSCOPIC
Bilirubin Urine: NEGATIVE
Glucose, UA: >=500 mg/dL — AB
Hgb urine dipstick: NEGATIVE
Ketones, ur: NEGATIVE mg/dL
Leukocytes,Ua: NEGATIVE
Nitrite: NEGATIVE
Protein, ur: >=300 mg/dL — AB
Specific Gravity, Urine: 1.025 (ref 1.005–1.030)
pH: 7 (ref 5.0–8.0)

## 2023-06-02 LAB — CBC
HCT: 37.7 % — ABNORMAL LOW (ref 39.0–52.0)
Hemoglobin: 11.6 g/dL — ABNORMAL LOW (ref 13.0–17.0)
MCH: 29.6 pg (ref 26.0–34.0)
MCHC: 30.8 g/dL (ref 30.0–36.0)
MCV: 96.2 fL (ref 80.0–100.0)
Platelets: 246 10*3/uL (ref 150–400)
RBC: 3.92 MIL/uL — ABNORMAL LOW (ref 4.22–5.81)
RDW: 14.7 % (ref 11.5–15.5)
WBC: 7 10*3/uL (ref 4.0–10.5)
nRBC: 0 % (ref 0.0–0.2)

## 2023-06-02 LAB — COMPREHENSIVE METABOLIC PANEL WITH GFR
ALT: 16 U/L (ref 0–44)
AST: 21 U/L (ref 15–41)
Albumin: <1.5 g/dL — ABNORMAL LOW (ref 3.5–5.0)
Alkaline Phosphatase: 66 U/L (ref 38–126)
Anion gap: 6 (ref 5–15)
BUN: 42 mg/dL — ABNORMAL HIGH (ref 8–23)
CO2: 24 mmol/L (ref 22–32)
Calcium: 7.8 mg/dL — ABNORMAL LOW (ref 8.9–10.3)
Chloride: 109 mmol/L (ref 98–111)
Creatinine, Ser: 3.2 mg/dL — ABNORMAL HIGH (ref 0.61–1.24)
GFR, Estimated: 20 mL/min — ABNORMAL LOW (ref >60)
Glucose, Bld: 346 mg/dL — ABNORMAL HIGH (ref 70–99)
Potassium: 4.3 mmol/L (ref 3.5–5.1)
Sodium: 139 mmol/L (ref 135–145)
Total Bilirubin: 0.2 mg/dL (ref 0.0–1.2)
Total Protein: 5.1 g/dL — ABNORMAL LOW (ref 6.5–8.1)

## 2023-06-02 LAB — TROPONIN I (HIGH SENSITIVITY): Troponin I (High Sensitivity): 21 ng/L — ABNORMAL HIGH (ref <18)

## 2023-06-02 LAB — TSH: TSH: 15.755 u[IU]/mL — ABNORMAL HIGH (ref 0.350–4.500)

## 2023-06-02 LAB — BRAIN NATRIURETIC PEPTIDE: B Natriuretic Peptide: >4500 pg/mL — ABNORMAL HIGH (ref 0.0–100.0)

## 2023-06-02 MED ORDER — CLOPIDOGREL BISULFATE 75 MG PO TABS
75.0000 mg | ORAL_TABLET | Freq: Every day | ORAL | Status: DC
  Administered 2023-06-02 – 2023-06-06 (×5): 75 mg via ORAL
  Filled 2023-06-02 (×5): qty 1

## 2023-06-02 MED ORDER — LEVOTHYROXINE SODIUM 25 MCG PO TABS
25.0000 ug | ORAL_TABLET | Freq: Every day | ORAL | Status: DC
  Administered 2023-06-02 – 2023-06-06 (×5): 25 ug via ORAL
  Filled 2023-06-02 (×5): qty 1

## 2023-06-02 MED ORDER — ONDANSETRON HCL 4 MG/2ML IJ SOLN
4.0000 mg | Freq: Four times a day (QID) | INTRAMUSCULAR | Status: DC | PRN
  Administered 2023-06-04: 4 mg via INTRAVENOUS
  Filled 2023-06-02: qty 2

## 2023-06-02 MED ORDER — SODIUM CHLORIDE 0.9 % IV SOLN: 250.0000 mL | INTRAVENOUS | Status: AC | PRN

## 2023-06-02 MED ORDER — HYDRALAZINE HCL 20 MG/ML IJ SOLN: 10.0000 mg | INTRAMUSCULAR | Status: DC | PRN

## 2023-06-02 MED ORDER — INSULIN ASPART 100 UNIT/ML IJ SOLN
0.0000 [IU] | Freq: Every day | INTRAMUSCULAR | Status: DC
  Administered 2023-06-04: 3 [IU] via SUBCUTANEOUS
  Administered 2023-06-05: 2 [IU] via SUBCUTANEOUS

## 2023-06-02 MED ORDER — SODIUM CHLORIDE 0.9% FLUSH
3.0000 mL | Freq: Two times a day (BID) | INTRAVENOUS | Status: DC
  Administered 2023-06-02 – 2023-06-06 (×9): 3 mL via INTRAVENOUS

## 2023-06-02 MED ORDER — ACETAMINOPHEN 325 MG PO TABS
650.0000 mg | ORAL_TABLET | ORAL | Status: DC | PRN
  Administered 2023-06-04 (×3): 650 mg via ORAL
  Filled 2023-06-02 (×3): qty 2

## 2023-06-02 MED ORDER — HEPARIN SODIUM (PORCINE) 5000 UNIT/ML IJ SOLN
5000.0000 [IU] | Freq: Three times a day (TID) | INTRAMUSCULAR | Status: DC
  Administered 2023-06-02 – 2023-06-06 (×12): 5000 [IU] via SUBCUTANEOUS
  Filled 2023-06-02 (×12): qty 1

## 2023-06-02 MED ORDER — ATORVASTATIN CALCIUM 80 MG PO TABS
80.0000 mg | ORAL_TABLET | Freq: Every day | ORAL | Status: DC
  Administered 2023-06-02 – 2023-06-05 (×4): 80 mg via ORAL
  Filled 2023-06-02 (×4): qty 1

## 2023-06-02 MED ORDER — FUROSEMIDE 10 MG/ML IJ SOLN
40.0000 mg | Freq: Two times a day (BID) | INTRAMUSCULAR | Status: DC
  Administered 2023-06-02 – 2023-06-03 (×2): 40 mg via INTRAVENOUS
  Filled 2023-06-02 (×2): qty 4

## 2023-06-02 MED ORDER — FUROSEMIDE 10 MG/ML IJ SOLN
40.0000 mg | Freq: Once | INTRAMUSCULAR | Status: AC
  Administered 2023-06-02: 40 mg via INTRAVENOUS
  Filled 2023-06-02: qty 4

## 2023-06-02 MED ORDER — ISOSORB DINITRATE-HYDRALAZINE 20-37.5 MG PO TABS
1.0000 | ORAL_TABLET | Freq: Three times a day (TID) | ORAL | Status: DC
  Administered 2023-06-02 – 2023-06-06 (×12): 1 via ORAL
  Filled 2023-06-02 (×13): qty 1

## 2023-06-02 MED ORDER — SODIUM CHLORIDE 0.9% FLUSH: 3.0000 mL | INTRAVENOUS | Status: DC | PRN

## 2023-06-02 MED ORDER — INSULIN GLARGINE-YFGN 100 UNIT/ML ~~LOC~~ SOLN
8.0000 [IU] | Freq: Every day | SUBCUTANEOUS | Status: DC
  Administered 2023-06-02 – 2023-06-05 (×4): 8 [IU] via SUBCUTANEOUS
  Filled 2023-06-02 (×6): qty 0.08

## 2023-06-02 MED ORDER — INSULIN ASPART 100 UNIT/ML IJ SOLN
0.0000 [IU] | Freq: Three times a day (TID) | INTRAMUSCULAR | Status: DC
  Administered 2023-06-02: 1 [IU] via SUBCUTANEOUS
  Administered 2023-06-02: 7 [IU] via SUBCUTANEOUS
  Administered 2023-06-03: 2 [IU] via SUBCUTANEOUS
  Administered 2023-06-03: 1 [IU] via SUBCUTANEOUS
  Administered 2023-06-04: 2 [IU] via SUBCUTANEOUS
  Administered 2023-06-04: 1 [IU] via SUBCUTANEOUS
  Administered 2023-06-04: 3 [IU] via SUBCUTANEOUS
  Administered 2023-06-05 – 2023-06-06 (×3): 2 [IU] via SUBCUTANEOUS

## 2023-06-02 MED ORDER — CARVEDILOL 6.25 MG PO TABS
6.2500 mg | ORAL_TABLET | Freq: Two times a day (BID) | ORAL | Status: DC
  Administered 2023-06-02 – 2023-06-03 (×3): 6.25 mg via ORAL
  Filled 2023-06-02: qty 1
  Filled 2023-06-02: qty 2
  Filled 2023-06-02: qty 1

## 2023-06-02 NOTE — ED Provider Notes (Signed)
 Colin [name] EMERGENCY DEPARTMENT AT [facility] Provider Note   CSN: [id number] Arrival date & time: 06/02/23  4098     History  Chief Complaint  Patient presents with   Shortness of Breath   Hypertension    Colin [name] is a 73 y.o. male with hx of diabetes, CAD s/p CABGx3 2022, carotid artery occlusion, HTN, HLD, CHF (LVEF 30-35%), who presents to the ER for shortness of breath x 2 days. Hx of noncompliance with medications, thought to be due to language barrier. Speaks [place] [name]. Placed on 3L O2 via nasal cannula for comfort by EMS. CBG with EMS 435.  Via interpreter, patient states he has been taking all his medications but has been swollen in his legs and abdomen. Initially says he "hurts" but when asked to specify, he denies chest pain. States it is hard to breathe. Does not wear chronic oxygen at home. Feels he has been urinating enough. Has blisters on the right lower leg, but states he does not know when they showed up.   The history is provided by the patient. The history is limited by a language barrier. A language interpreter was used.  Shortness of Breath Hypertension Associated symptoms include shortness of breath.       Home Medications Prior to Admission medications   Medication Sig Start Date End Date Taking? Authorizing Provider  atorvastatin  (LIPITOR ) 80 MG tablet Take 1 tablet (80 mg total) by mouth daily. 03/02/23   Arrien, Mauricio Daniel, MD  carvedilol  (COREG ) 6.25 MG tablet Take 1 tablet (6.25 mg total) by mouth 2 (two) times daily with a meal. 03/02/23 04/01/23  Arrien, Curlee Doss, MD  clopidogrel  (PLAVIX ) 75 MG tablet Take 1 tablet (75 mg total) by mouth daily. 03/02/23   Arrien, Curlee Doss, MD  Continuous Glucose Receiver (FREESTYLE LIBRE 3 READER) DEVI Use as directed! 02/06/22   Lajean Pike, MD  Continuous Glucose Sensor (FREESTYLE LIBRE 3 SENSOR) MISC Use as instructed change every 14 days 08/01/22   Motwani, Komal, MD  furosemide   (LASIX ) 20 MG tablet Take 3 tablets (60 mg total) by mouth daily. 05/15/23   Robinson, John K, PA-C  insulin  glargine (LANTUS ) 100 UNIT/ML Solostar Pen Inject 8 Units into the skin at bedtime. 03/02/23 04/08/23  Arrien, Mauricio Daniel, MD  insulin  lispro (ADMELOG  SOLOSTAR) 100 UNIT/ML KwikPen Inject 3 Units into the skin 3 (three) times daily with meals. 03/02/23   Arrien, Mauricio Daniel, MD  Insulin  Pen Needle (PEN NEEDLES 3/16") 31G X 5 MM MISC Uses three times daily with insulin . 05/24/22   Shamleffer, Ibtehal Jaralla, MD  Insulin  Pen Needle 32G X 4 MM MISC Use as directed with Humalog  and Lantus  pens. 12/08/22   Arrien, Mauricio Daniel, MD  isosorbide -hydrALAZINE  (BIDIL ) 20-37.5 MG tablet Take 1 tablet by mouth 3 (three) times daily. 03/02/23   Arrien, Mauricio Daniel, MD  levothyroxine  (SYNTHROID ) 25 MCG tablet Take 1 tablet (25 mcg total) by mouth daily at 6 (six) AM. 03/02/23 04/01/23  Arrien, Curlee Doss, MD  nitroGLYCERIN  (NITROSTAT ) 0.4 MG SL tablet Place 1 tab (0.4mg  total) under the tongue every 5 minutes as needed for chest pain 02/14/22   Patwardhan, Kaye Parsons, MD  potassium chloride  SA (KLOR-CON  M) 20 MEQ tablet Take 1 tablet (20 mEq total) by mouth 2 (two) times daily for 10 days. 05/15/23 05/25/23  Robinson, John K, PA-C      Allergies    Patient has no known allergies.    Review of Systems  Review of Systems  Respiratory:  Positive for shortness of breath.   Cardiovascular:  Positive for leg swelling.  Gastrointestinal:  Positive for abdominal distention.  All other systems reviewed and are negative.   Physical Exam Updated Vital Signs BP (!) 183/100   Pulse 87   Resp (!) 22   SpO2 100%  Physical Exam Vitals and nursing note reviewed.  Constitutional:      Appearance: Normal appearance.  HENT:     Head: Normocephalic and atraumatic.  Eyes:     Conjunctiva/sclera: Conjunctivae normal.  Cardiovascular:     Rate and Rhythm: Normal rate and regular rhythm.  Pulmonary:      Effort: Pulmonary effort is normal. No respiratory distress.     Breath sounds: Normal breath sounds.     Comments: Put on 2L [place] for comfort Abdominal:     General: There is no distension.     Palpations: Abdomen is soft.     Tenderness: There is no abdominal tenderness.  Musculoskeletal:     Right lower leg: 2+ Pitting Edema present.     Left lower leg: 2+ Pitting Edema present.  Skin:    General: Skin is warm and dry.     Comments: Blisters to the right lower leg  Neurological:     General: No focal deficit present.     Mental Status: He is alert.     ED Results / Procedures / Treatments   Labs (all labs ordered are listed, but only abnormal results are displayed) Labs Reviewed  CBC - Abnormal; Notable for the following components:      Result Value   RBC 3.92 (*)    Hemoglobin 11.6 (*)    HCT 37.7 (*)    All other components within normal limits  BRAIN NATRIURETIC PEPTIDE - Abnormal; Notable for the following components:   B Natriuretic Peptide >4,500.0 (*)    All other components within normal limits  URINALYSIS, ROUTINE W REFLEX MICROSCOPIC - Abnormal; Notable for the following components:   Glucose, UA >=500 (*)    Protein, ur >=300 (*)    Bacteria, UA RARE (*)    All other components within normal limits  COMPREHENSIVE METABOLIC PANEL WITH GFR - Abnormal; Notable for the following components:   Glucose, Bld 346 (*)    BUN 42 (*)    Creatinine, Ser 3.20 (*)    Calcium  7.8 (*)    Total Protein 5.1 (*)    Albumin  <1.5 (*)    GFR, Estimated 20 (*)    All other components within normal limits  TROPONIN I (HIGH SENSITIVITY) - Abnormal; Notable for the following components:   Troponin I (High Sensitivity) 21 (*)    All other components within normal limits    EKG EKG Interpretation Date/Time:  Sunday June 02 2023 06:58:06 EDT Ventricular Rate:  94 PR Interval:  191 QRS Duration:  112 QT Interval:  345 QTC Calculation: 432 R Axis:   -36  Text  Interpretation: Sinus rhythm Low voltage, extremity leads LVH with secondary repolarization abnormality Anterior Q waves, possibly due to LVH Confirmed by Iva Mariner ([id number]) on 06/02/2023 7:48:18 AM  Radiology DG Chest Portable 1 View Result Date: 06/02/2023 CLINICAL DATA:  CHF and shortness of breath. EXAM: PORTABLE CHEST 1 VIEW COMPARISON:  05/15/2023 FINDINGS: Previous median sternotomy and CABG procedure. Aortic atherosclerosis. Moderate to large left pleural effusion is unchanged from prior exam. New small right pleural effusion with veil like opacification over the right lower lung. Mildly  increased interstitial markings. IMPRESSION: 1. New small right pleural effusion. 2. Unchanged moderate to large left pleural effusion. 3. Mildly increased interstitial markings compatible with pulmonary edema. Electronically Signed   By: Kimberley Penman M.D.   On: 06/02/2023 07:41    Procedures Procedures    Medications Ordered in ED Medications  isosorbide -hydrALAZINE  (BIDIL ) 20-37.5 MG per tablet 1 tablet (1 tablet Oral Given 06/02/23 1023)  carvedilol  (COREG ) tablet 6.25 mg (6.25 mg Oral Given 06/02/23 0854)  furosemide  (LASIX ) injection 40 mg (40 mg Intravenous Given 06/02/23 1023)    ED Course/ Medical Decision Making/ A&P Clinical Course as of 06/02/23 1026  Sun Jun 02, 2023  0807 Comprehensive metabolic panel CMP and troponin hemolyzed [LR]    Clinical Course User Index [LR] Madelynne Lasker, Anna Kettering, PA-C                                 Medical Decision Making Amount and/or Complexity of Data Reviewed Labs: ordered. Radiology: ordered.  This patient is a 73 y.o. male  who presents to the ED for concern of shortness of breath.   Differential diagnoses prior to evaluation: The emergent differential diagnosis includes, but is not limited to,  CHF, pericardial effusion/tamponade, arrhythmias, ACS, COPD, asthma, bronchitis, pneumonia, pneumothorax, PE, anemia. This is not an exhaustive  differential.   Past Medical History / Co-morbidities / Social History: diabetes, CAD s/p CABGx3 2022, carotid artery occlusion, HTN, HLD, CHF (LVEF 30-35%)  Additional history: Chart reviewed. Pertinent results include: Most recent ER visit for symptoms related to CHF, patient with stable troponins, no hypoxia, discharged with Lasix .  Prior to that patient required admission in January 2025 (required IR thoracentesis at that time), and November 2024 and twice in October 2024 for CHF exacerbations.   Normally on 60 mg lasix  PO daily.   Physical Exam: Physical exam performed. The pertinent findings include: Patient appears uncomfortable in the bed.  Hypertensive.  Maintaining normal oxygen saturations, but put on 2 L nasal cannula for comfort.  Lung sounds clear.  2+ pitting edema to the bilateral legs.  Blisters on the right lower leg.  Lab Tests/Imaging studies: I personally interpreted labs/imaging and the pertinent results include: CBC grossly baseline.  CMP with glucose 346, BUN 42, creatinine 3.20, albumin  was 1.5, GFR 20.  UA with over 500 glucose, over 300 protein, rare bacteria.  Initial troponin 21, delta troponin pending.  BNP over 4500.  CXR with new small right pleural effusion and unchanged moderate to large left pleural effusion, mildly increased interstitial markings. I agree with the radiologist interpretation.  Cardiac monitoring: EKG obtained and interpreted by myself and attending physician which shows: Sinus rhythm, no significant change compared to prior   Medications: I ordered medication including home blood pressure medications, IV Lasix .  I have reviewed the patients home medicines and have made adjustments as needed.  Consultations obtained: I consulted with hospitalist Dr.Smith who will admit.   Disposition: After consideration of the diagnostic results and the patients response to treatment, I feel that patient is requiring admission for ongoing diuresis and  treatment for CHF exacerbation. Could likely benefit from repeat IR thoracentesis for left sided pleural effusion.   Final Clinical Impression(s) / ED Diagnoses Final diagnoses:  Acute on chronic congestive heart failure, unspecified heart failure type (HCC)  Hypervolemia, unspecified hypervolemia type  Pleural effusion    Rx / DC Orders ED Discharge Orders     None  Portions of this report may have been transcribed using voice recognition software. Every effort was made to ensure accuracy; however, inadvertent computerized transcription errors may be present.    Raygen Linquist T, PA-C 06/02/23 1027    Iva Mariner, MD 06/02/23 1550

## 2023-06-02 NOTE — H&P (Addendum)
 History and Physical    Patient: Colin [name] ZOX:[id number] DOB: 07-30-1950 DOA: 06/02/2023 DOS: the patient was seen and examined on 06/02/2023 PCP: Marius Siemens, NP  Patient coming from: Home lives with wife and son([name])  Chief Complaint:  Chief Complaint  Patient presents with   Shortness of Breath   Hypertension   HPI: GLENROY BABAUTA is a 73 y.o. male with medical history significant of f CAD s/p CABG x 3, chronic combined systolic and diastolic heart failure, type 2 diabetes, hyperlipidemia, hypertension, history of NSTEMI, GERD, CKD stage IV who presents with complaints of increased shortness of breath for the last 3 days.  History is obtained with the use of [place] interpreter services over the phone.  He reports that he has had progressive worsening swelling of his lower extremities going up into his abdomen.  Unable to lay down due due to feeling like he cannot breathe.  Denies having a productive cough.  Unsure of when the blisters developed on his legs.  Denies having any fever or blood in his stools.  States he has been taking all of his medications.    Review of records shows that furosemide  was last filled for 30 days supply on 4/9.  All other medications appear not been filled since he was last hospitalized for congestive heart failure exacerbation in January.  Patient does make note that thoracentesis helped with his breathing within a few minutes after having the procedure during his last hospitalization.  Over the phone his son notes that his mother had a stroke and is currently on dialysis and he is only 1 in the house to help them out.  The patient manages his own medications.  His son notes that he does not let him know when he has appointments.  He notes that his father just sits around and does not do anything.  In the emergency department patient was noted to be tachypneic with blood pressures elevated up to 199/115, and O2 saturations maintained on 2 L of  nasal cannula oxygen.  Labs significant for BNP greater than 4500, high-sensitivity troponin 21, hemoglobin 11.6, BUN 42, creatinine 3.2, glucose 346, and albumin  less than 1.5.  Urinalysis positive for glucose and negative for ketones.  Chest x-ray noted noted small right pleural effusion and unchanged moderate to large left pleural effusion with mild pulmonary edema.  Review of Systems: As mentioned in the history of present illness. All other systems reviewed and are negative. Past Medical History:  Diagnosis Date   Acute hypoxic respiratory failure (HCC) 12/16/2021   Acute pulmonary edema (HCC)    Arrhythmia    CAD (coronary artery disease)    a. s/p stent x 2; b. 08/2020 CABG x 3 (LIMA->LAD, VG->PDA, VG->OM1).   Carotid artery occlusion    Chronic combined systolic and diastolic heart failure (HCC)    Chronic HFrEF (heart failure with reduced ejection fraction) (HCC)    a. 11/2022 Echo: EF 25-30%, glob HK, GrI DD, nl RV fxn, mod dil LA, mild-mod MR.   Diabetes mellitus    Dyslipidemia    Essential hypertension    Ischemic cardiomyopathy    a. 11/2022 Echo: EF 25-30%.   Left upper quadrant abdominal pain 05/30/2022   NSTEMI (non-ST elevated myocardial infarction) [facility])    Past Surgical History:  Procedure Laterality Date   CARDIAC CATHETERIZATION     01/2010 ; 04/2010   CORONARY ARTERY BYPASS GRAFT N/A 08/23/2020   Procedure: CORONARY ARTERY BYPASS GRAFTING (CABG)  x 3 ON CARDIOPULMONARY BYPASS USING EVH AND PARTIALLY OPEN HARVESTED LEFT GREATER SAPHENOUS VEIN AND LEFT INTERNAL MAMMARY ARTERY.  LIMA TO LAD, SVG TO OM, SVG TO PDA;  Surgeon: Hilarie Lovely, MD;  Location: [facility] OR;  Service: Open Heart Surgery;  Laterality: N/A;   IR THORACENTESIS ASP PLEURAL SPACE W/IMG GUIDE  02/27/2023   RIGHT HEART CATH N/A 12/19/2021   Procedure: RIGHT HEART CATH;  Surgeon: Cody Das, MD;  Location: [facility] INVASIVE CV LAB;  Service: Cardiovascular;  Laterality: N/A;   RIGHT/LEFT HEART CATH  AND CORONARY ANGIOGRAPHY N/A 08/22/2020   Procedure: RIGHT/LEFT HEART CATH AND CORONARY ANGIOGRAPHY;  Surgeon: Cody Das, MD;  Location: [facility] INVASIVE CV LAB;  Service: Cardiovascular;  Laterality: N/A;   TEE WITHOUT CARDIOVERSION N/A 08/23/2020   Procedure: TRANSESOPHAGEAL ECHOCARDIOGRAM (TEE);  Surgeon: Hilarie Lovely, MD;  Location: [facility] OR;  Service: Open Heart Surgery;  Laterality: N/A;   TEMPORARY PACEMAKER N/A 08/22/2020   Procedure: TEMPORARY PACEMAKER;  Surgeon: Cody Das, MD;  Location: [facility] INVASIVE CV LAB;  Service: Cardiovascular;  Laterality: N/A;   VEIN HARVEST Left 08/23/2020   Procedure: ENDOVEIN HARVEST AND PARTIALLY OPEN GREATER SAPHENOUS VEIN HARVESTING;  Surgeon: Hilarie Lovely, MD;  Location: [facility] OR;  Service: Open Heart Surgery;  Laterality: Left;   Social History:  reports that he quit smoking about 2 years ago. His smoking use included cigarettes. He started smoking about 52 years ago. He has a 12.5 pack-year smoking history. He has never used smokeless tobacco. He reports that he does not drink alcohol and does not use drugs.  No Known Allergies  Family History  Problem Relation Age of Onset   Diabetes Neg Hx     Prior to Admission medications   Medication Sig Start Date End Date Taking? Authorizing Provider  atorvastatin  (LIPITOR ) 80 MG tablet Take 1 tablet (80 mg total) by mouth daily. 03/02/23   Arrien, Mauricio Daniel, MD  carvedilol  (COREG ) 6.25 MG tablet Take 1 tablet (6.25 mg total) by mouth 2 (two) times daily with a meal. 03/02/23 04/01/23  Arrien, Curlee Doss, MD  clopidogrel  (PLAVIX ) 75 MG tablet Take 1 tablet (75 mg total) by mouth daily. 03/02/23   Arrien, Curlee Doss, MD  Continuous Glucose Receiver (FREESTYLE LIBRE 3 READER) DEVI Use as directed! 02/06/22   Lajean Pike, MD  Continuous Glucose Sensor (FREESTYLE LIBRE 3 SENSOR) MISC Use as instructed change every 14 days 08/01/22   Motwani, Komal, MD  furosemide  (LASIX ) 20 MG  tablet Take 3 tablets (60 mg total) by mouth daily. 05/15/23   Robinson, John K, PA-C  insulin  glargine (LANTUS ) 100 UNIT/ML Solostar Pen Inject 8 Units into the skin at bedtime. 03/02/23 04/08/23  Arrien, Mauricio Daniel, MD  insulin  lispro (ADMELOG  SOLOSTAR) 100 UNIT/ML KwikPen Inject 3 Units into the skin 3 (three) times daily with meals. 03/02/23   Arrien, Curlee Doss, MD  Insulin  Pen Needle (PEN NEEDLES 3/16") 31G X 5 MM MISC Uses three times daily with insulin . 05/24/22   Shamleffer, Ibtehal Jaralla, MD  Insulin  Pen Needle 32G X 4 MM MISC Use as directed with Humalog  and Lantus  pens. 12/08/22   Arrien, Mauricio Daniel, MD  isosorbide -hydrALAZINE  (BIDIL ) 20-37.5 MG tablet Take 1 tablet by mouth 3 (three) times daily. 03/02/23   Arrien, Curlee Doss, MD  levothyroxine  (SYNTHROID ) 25 MCG tablet Take 1 tablet (25 mcg total) by mouth daily at 6 (six) AM. 03/02/23 04/01/23  Arrien, Curlee Doss, MD  nitroGLYCERIN  (NITROSTAT ) 0.4 MG  SL tablet Place 1 tab (0.4mg  total) under the tongue every 5 minutes as needed for chest pain 02/14/22   Patwardhan, Kaye Parsons, MD  potassium chloride  SA (KLOR-CON  M) 20 MEQ tablet Take 1 tablet (20 mEq total) by mouth 2 (two) times daily for 10 days. 05/15/23 05/25/23  Janalee Mcmurray, PA-C    Physical Exam: Vitals:   06/02/23 0656 06/02/23 0800 06/02/23 0915 06/02/23 0945  BP: (!) 189/105 (!) 199/115 (!) 173/88 (!) 183/100  Pulse: 93 97 89 87  Resp: 16 (!) 23 16 (!) 22  SpO2: 94% 100% 100% 100%    Constitutional: Elderly who appears to be in acute respiratory distress Eyes: PERRL, lids and conjunctivae normal ENMT: Mucous membranes are moist. Normal dentition.  Neck: normal, supple, JVD present.   Respiratory: Tachypneic with crackles appreciated and decreased aeration most notably on the left mid to lower lung field.  Currently on 3 L of nasal cannula oxygen. Cardiovascular: Regular rate and rhythm, positive murmur.  3+ pitting bilateral lower extremity  edema. Abdomen: Abdominal distention appreciated without tenderness to palpation. Musculoskeletal: no clubbing / cyanosis. No joint deformity upper and lower extremities. Good ROM, no contractures. Normal muscle tone.  Skin: Bullae present of the right leg with 1 that is popped without surrounding erythema  Neurologic: CN 2-12 grossly intact.   Patient able to move all extremities Psychiatric: Normal judgment and insight. Alert and oriented x 3. Normal mood.   Data Reviewed:  EKG reveals sinus rhythm at 94 bpm with low voltage noted in extremities leads and LVH.  Reviewed labs, imaging, and pertinent records as documented.  Assessment and Plan:  Acute respiratory distress Combined systolic and diastolic congestive heart failure Acute on chronic.  Patient presents with increased shortness of breath and lower extremity swelling.  O2 saturations were maintained on room air but he was placed on 2 L of nasal cannula oxygen due to work of breathing.  He reports compliance with furosemide  60 mg which had recently been filled on 4/9, but no other medications appear to have recently been filled.  BNP greater than 4500.  Last echocardiogram noted EF to be 30 to 35% with grade 3 diastolic dysfunction (restrictive) with moderate to severe mitral valve regurgitation.  Patient had been given Lasix  40 mg IV. - Admit cardiac telemetry bed - Heart failure order set utilized - Elevate lower extremities - Strict I&O's and daily weights - Continue Lasix  40 mg IV twice daily - Continue Coreg  and BiDil   Hypertensive urgency Blood pressures noted to be elevated up to 199/115.  Home blood pressure regimen included including BiDil  and Coreg , but records and has not been filled recently. - Continue Coreg  and BiDil  - Hydralazine  IV as needed for elevated blood pressures  Pleural effusion due to congestive heart failure Acute on chronic.  Patient noted to have a moderate to large left pleural effusion that was  unchanged and new small right pleural effusion present.   - IR consulted for thoracentesis  Elevated troponin CAD Hyperlipidemia Chronic.  S/p CABG.  High-sensitivity troponin mildly elevated at 21.  Suspect secondary to demand in the setting of patient being acutely fluid overloaded - Will resume Plavix  and statin  Uncontrolled diabetes mellitus type 2 with hyperglycemia On admission glucose noted to be elevated up to 346.  Last available hemoglobin A1c was 11.6 when checked on 02/07/2023. - Hypoglycemic protocols - Semglee  8 units daily - CBGs before every meal with sensitive SSI  Chronic kidney disease stage IV Creatinine  noted to be 3.2 with BUN 48.  Baseline appears to 2.5-3.15. - Continue to monitor kidney function with diuresis.  Normocytic anemia Acute.  Hemoglobin noted to be 11.6.  May be dilutional.  No reports of bleeding. - Recheck CBC tomorrow morning  Hypothyroidism TSH was noted to be 8.509 when checked on 02/26/2023.  Records note prescription for levothyroxine  was last filled in January for 30-day supply. - Add-on TSH - Restart levothyroxine  25 mcg daily  DVT prophylaxis: Heparin  Advance Care Planning:   Code Status: Prior    Consults: Cardiology  Family Communication: Son([name]) updated over the phone, but noted having difficulties with his phone as the screen is broken so may not pick up the first time  Severity of Illness: The appropriate patient status for this patient is INPATIENT. Inpatient status is judged to be reasonable and necessary in order to provide the required intensity of service to ensure the patient's safety. The patient's presenting symptoms, physical exam findings, and initial radiographic and laboratory data in the context of their chronic comorbidities is felt to place them at high risk for further clinical deterioration. Furthermore, it is not anticipated that the patient will be medically stable for discharge from the hospital within 2  midnights of admission.   * I certify that at the point of admission it is my clinical judgment that the patient will require inpatient hospital care spanning beyond 2 midnights from the point of admission due to high intensity of service, high risk for further deterioration and high frequency of surveillance required.*  Author: Lena Qualia, MD 06/02/2023 10:18 AM  For on call review [email or web address].

## 2023-06-02 NOTE — ED Triage Notes (Signed)
 Patient coming from home with shortness of breath. Woke up with shortness of breath. History of CHF, noncompliant with meds possibly due to language barrier. EMS noted clear lung sounds and swelling in legs and abdomin.  EMS VS 200/100 BP 90 HR 94% RA and 100% on 3L for comfort 435 CBG history of diabetes

## 2023-06-03 ENCOUNTER — Inpatient Hospital Stay (HOSPITAL_COMMUNITY)

## 2023-06-03 DIAGNOSIS — I509 Heart failure, unspecified: Secondary | ICD-10-CM | POA: Diagnosis not present

## 2023-06-03 DIAGNOSIS — I2510 Atherosclerotic heart disease of native coronary artery without angina pectoris: Secondary | ICD-10-CM | POA: Diagnosis not present

## 2023-06-03 DIAGNOSIS — R7989 Other specified abnormal findings of blood chemistry: Secondary | ICD-10-CM | POA: Diagnosis not present

## 2023-06-03 DIAGNOSIS — I5023 Acute on chronic systolic (congestive) heart failure: Secondary | ICD-10-CM | POA: Diagnosis not present

## 2023-06-03 DIAGNOSIS — I160 Hypertensive urgency: Secondary | ICD-10-CM | POA: Diagnosis not present

## 2023-06-03 LAB — TSH: TSH: 9.007 u[IU]/mL — ABNORMAL HIGH (ref 0.350–4.500)

## 2023-06-03 LAB — BASIC METABOLIC PANEL WITH GFR
Anion gap: 8 (ref 5–15)
BUN: 46 mg/dL — ABNORMAL HIGH (ref 8–23)
CO2: 26 mmol/L (ref 22–32)
Calcium: 8 mg/dL — ABNORMAL LOW (ref 8.9–10.3)
Chloride: 109 mmol/L (ref 98–111)
Creatinine, Ser: 3.55 mg/dL — ABNORMAL HIGH (ref 0.61–1.24)
GFR, Estimated: 17 mL/min — ABNORMAL LOW (ref >60)
Glucose, Bld: 113 mg/dL — ABNORMAL HIGH (ref 70–99)
Potassium: 4.6 mmol/L (ref 3.5–5.1)
Sodium: 143 mmol/L (ref 135–145)

## 2023-06-03 LAB — GLUCOSE, CAPILLARY
Glucose-Capillary: 102 mg/dL — ABNORMAL HIGH (ref 70–99)
Glucose-Capillary: 136 mg/dL — ABNORMAL HIGH (ref 70–99)
Glucose-Capillary: 200 mg/dL — ABNORMAL HIGH (ref 70–99)
Glucose-Capillary: 177 mg/dL — ABNORMAL HIGH (ref 70–99)

## 2023-06-03 LAB — T4, FREE: Free T4: 0.8 ng/dL (ref 0.61–1.12)

## 2023-06-03 MED ORDER — FUROSEMIDE 10 MG/ML IJ SOLN
80.0000 mg | Freq: Two times a day (BID) | INTRAMUSCULAR | Status: DC
  Administered 2023-06-03: 80 mg via INTRAVENOUS
  Filled 2023-06-03: qty 8

## 2023-06-03 MED ORDER — LIDOCAINE HCL 1 % IJ SOLN
INTRAMUSCULAR | Status: AC
  Filled 2023-06-03: qty 20

## 2023-06-03 MED ORDER — LIDOCAINE HCL 1 % IJ SOLN
20.0000 mL | Freq: Once | INTRAMUSCULAR | Status: AC
  Administered 2023-06-03: 10 mL

## 2023-06-03 NOTE — Progress Notes (Signed)
 Heart Failure Navigator Progress Note  Assessed for Heart & Vascular TOC clinic readiness.  Patient does not meet criteria due to Advanced Heart Failure team patient of Dr. Alease Amend. .   Navigator will sign off at this time.   Randie Bustle, BSN, Scientist, clinical (histocompatibility and immunogenetics) Only

## 2023-06-03 NOTE — Inpatient Diabetes Management (Addendum)
 Inpatient Diabetes Program Recommendations  AACE/ADA: New Consensus Statement on Inpatient Glycemic Control (2015)  Target Ranges:  Prepandial:   less than 140 mg/dL      Peak postprandial:   less than 180 mg/dL (1-2 hours)      Critically ill patients:  140 - 180 mg/dL    Latest Reference Range & Units 06/02/23 13:08 06/02/23 15:07 06/02/23 18:33 06/02/23 21:01  Glucose-Capillary 70 - 99 mg/dL 416 (H) 606 (H) 301 (H) 102 (H)  (H): Data is abnormally high  Latest Reference Range & Units 06/03/23 06:17 06/03/23 11:30  Glucose-Capillary 70 - 99 mg/dL 601 (H) 093 (H)  (H): Data is abnormally high     Home DM Meds: Lantus  8 units QHS        Admelog  3 units TID with meals        FSL 3 CGM  Current Orders: Semglee  8 units daily      Novolog  Sensitive Correction Scale/ SSI (0-9 units) TID AC + HS     Attempted to meet with pt today at bedside.  Pt was very sleepy and would open his eyes briefly and then fall right back asleep even when I kept calling his name.  Will attempt to call son to gain info about home Diabetes meds.   Addendum 1:30pm--Attempted to call son but no answer.    --Will follow patient during hospitalization--  Langston Pippins RN, MSN, CDCES Diabetes Coordinator Inpatient Glycemic Control Team Team Pager: [phone] (8a-5p)

## 2023-06-03 NOTE — Consult Note (Signed)
 Cardiology Consultation   Patient ID: Colin [name] MRN: [id number]; DOB: 04/04/1950  Admit date: 06/02/2023 Date of Consult: 06/03/2023  PCP:  Marius Siemens, NP   [facility] Providers Cardiologist:  Cody Das, MD  Advanced Heart Failure:  Lauralee Poll, MD  {  Patient Profile:   Colin [name] is a 73 y.o. male with a hx of CAD with prior MI, LCX and RCA stents in 2012, s/p CABG X 3. LIMA LAD, RSVG PDA, OM1 (08/23/2020), chronic systolic heart failure, diabetes, HTN, HLD, CKD stage 4 (baseline SCr 2-2.5), tobacco use and GERD who is being seen 06/03/2023 for the evaluation of acute on chronic heart failure at the request of Dr. Mae Schlossman.  History of Present Illness:   Colin [name] has past medical history as stated above. He presented to ED on 06/02/2023 for shortness of breath x 2 days. He has a known history of medication noncompliance that he does not typically admit to. He also reports worsening edema of his LE extending into his abdomen. He reports orthopnea.   Primary team accessed the dispense report which noted that Lasix  was filled on April 9th for 30 day supply but almost all medications other than Lasix  has not been filled since January, his last admission to the hospital.   History was obtained from his son as well, who indicated that his mother, had recently had a stroke and is currently on dialysis and that his father is the only one around to help her. He says the patient manages his own medications, does not let anyone know when he has appointments. Noting that his father often will just sit around and have no drive to do anything.   While in the ED, he was noted to be tachypneic with elevated BP, up too 199/115. Labs revealed BNP > 4,500, creatinine 3.2 (baseline 2-2.5), CXR showed small right pleural effusion and unchanged moderate to large left pleural effusion with mild pulmonary edema.   Patient has been given IV Lasix  40 mg x 3 doses (started on  BID dosing), restarted on his CoReg  6.25 mg BID, Plavix  75 mg daily, BiDil  20-37.5 mg TID, and Lipitor  80 mg daily.   He was just recently in the hospital from 1/20-1/25/2025 for another acute on chronic CHF exacerbation. During that admission he was given IV diuresis with results of net - 11 L were taken off and he had improvement of his symptoms. He also underwent a left thoracentesis with 700 cc taken off, found to be transudative, also relieving much of his symptoms.   He was recently seen in the outpatient setting by Dr. Judyth Nunnery for advanced heart failure on 03/15/2023. At this visit, patient appeared significantly improved. He reported that he was taking his medications however noted poor PO intake. Overall, the patient was noted to have poor prognosis, be a poor dialysis candidate nor a candidate for advanced therapies as he showed signs of failure to thrive.   After speaking with the patient, he is very drowsy in the bed and does not interact with me much. The interpreter is used however he answers most of my questions before they are able to translate. He says he still feels short of breath, he still has a decent amount of swelling. He admits to feeling slightly relieved after his thoracentesis. Overall, he seems very drowsy and weak.   Past Medical History:  Diagnosis Date   Acute hypoxic respiratory failure (HCC) 12/16/2021   Acute pulmonary  edema (HCC)    Arrhythmia    CAD (coronary artery disease)    a. s/p stent x 2; b. 08/2020 CABG x 3 (LIMA->LAD, VG->PDA, VG->OM1).   Carotid artery occlusion    Chronic combined systolic and diastolic heart failure (HCC)    Chronic HFrEF (heart failure with reduced ejection fraction) (HCC)    a. 11/2022 Echo: EF 25-30%, glob HK, GrI DD, nl RV fxn, mod dil LA, mild-mod MR.   Diabetes mellitus    Dyslipidemia    Essential hypertension    Ischemic cardiomyopathy    a. 11/2022 Echo: EF 25-30%.   Left upper quadrant abdominal pain 05/30/2022    NSTEMI (non-ST elevated myocardial infarction) [facility])    Past Surgical History:  Procedure Laterality Date   CARDIAC CATHETERIZATION     01/2010 ; 04/2010   CORONARY ARTERY BYPASS GRAFT N/A 08/23/2020   Procedure: CORONARY ARTERY BYPASS GRAFTING (CABG) x 3 ON CARDIOPULMONARY BYPASS USING EVH AND PARTIALLY OPEN HARVESTED LEFT GREATER SAPHENOUS VEIN AND LEFT INTERNAL MAMMARY ARTERY.  LIMA TO LAD, SVG TO OM, SVG TO PDA;  Surgeon: Hilarie Lovely, MD;  Location: [facility] OR;  Service: Open Heart Surgery;  Laterality: N/A;   IR THORACENTESIS ASP PLEURAL SPACE W/IMG GUIDE  02/27/2023   IR THORACENTESIS ASP PLEURAL SPACE W/IMG GUIDE  06/03/2023   RIGHT HEART CATH N/A 12/19/2021   Procedure: RIGHT HEART CATH;  Surgeon: Cody Das, MD;  Location: [facility] INVASIVE CV LAB;  Service: Cardiovascular;  Laterality: N/A;   RIGHT/LEFT HEART CATH AND CORONARY ANGIOGRAPHY N/A 08/22/2020   Procedure: RIGHT/LEFT HEART CATH AND CORONARY ANGIOGRAPHY;  Surgeon: Cody Das, MD;  Location: [facility] INVASIVE CV LAB;  Service: Cardiovascular;  Laterality: N/A;   TEE WITHOUT CARDIOVERSION N/A 08/23/2020   Procedure: TRANSESOPHAGEAL ECHOCARDIOGRAM (TEE);  Surgeon: Hilarie Lovely, MD;  Location: [facility] OR;  Service: Open Heart Surgery;  Laterality: N/A;   TEMPORARY PACEMAKER N/A 08/22/2020   Procedure: TEMPORARY PACEMAKER;  Surgeon: Cody Das, MD;  Location: [facility] INVASIVE CV LAB;  Service: Cardiovascular;  Laterality: N/A;   VEIN HARVEST Left 08/23/2020   Procedure: ENDOVEIN HARVEST AND PARTIALLY OPEN GREATER SAPHENOUS VEIN HARVESTING;  Surgeon: Hilarie Lovely, MD;  Location: [facility] OR;  Service: Open Heart Surgery;  Laterality: Left;    Home Medications:  Prior to Admission medications   Medication Sig Start Date End Date Taking? Authorizing Provider  furosemide  (LASIX ) 20 MG tablet Take 3 tablets (60 mg total) by mouth daily. 05/15/23  Yes Robinson, John K, PA-C  potassium chloride  SA (KLOR-CON  M) 20 MEQ  tablet Take 1 tablet (20 mEq total) by mouth 2 (two) times daily for 10 days. Patient taking differently: Take 20 mEq by mouth daily. 05/15/23 06/02/23 Yes Robinson, John K, PA-C  atorvastatin  (LIPITOR ) 80 MG tablet Take 1 tablet (80 mg total) by mouth daily. Patient not taking: Reported on 06/02/2023 03/02/23   Arrien, Mauricio Daniel, MD  carvedilol  (COREG ) 6.25 MG tablet Take 1 tablet (6.25 mg total) by mouth 2 (two) times daily with a meal. Patient not taking: Reported on 06/02/2023 03/02/23 04/01/23  Arrien, Curlee Doss, MD  clopidogrel  (PLAVIX ) 75 MG tablet Take 1 tablet (75 mg total) by mouth daily. Patient not taking: Reported on 06/02/2023 03/02/23   Arrien, Curlee Doss, MD  Continuous Glucose Receiver (FREESTYLE LIBRE 3 READER) DEVI Use as directed! 02/06/22   Lajean Pike, MD  Continuous Glucose Sensor (FREESTYLE LIBRE 3 SENSOR) MISC Use as instructed change every 14 days 08/01/22  Motwani, Komal, MD  insulin  glargine (LANTUS ) 100 UNIT/ML Solostar Pen Inject 8 Units into the skin at bedtime. Patient not taking: Reported on 06/02/2023 03/02/23 04/08/23  Arrien, Mauricio Daniel, MD  insulin  lispro (ADMELOG  SOLOSTAR) 100 UNIT/ML KwikPen Inject 3 Units into the skin 3 (three) times daily with meals. Patient not taking: Reported on 06/02/2023 03/02/23   Arrien, Curlee Doss, MD  Insulin  Pen Needle (PEN NEEDLES 3/16") 31G X 5 MM MISC Uses three times daily with insulin . 05/24/22   Shamleffer, Ibtehal Jaralla, MD  Insulin  Pen Needle 32G X 4 MM MISC Use as directed with Humalog  and Lantus  pens. 12/08/22   Arrien, Mauricio Daniel, MD  isosorbide -hydrALAZINE  (BIDIL ) 20-37.5 MG tablet Take 1 tablet by mouth 3 (three) times daily. Patient not taking: Reported on 06/02/2023 03/02/23   Arrien, Curlee Doss, MD  levothyroxine  (SYNTHROID ) 25 MCG tablet Take 1 tablet (25 mcg total) by mouth daily at 6 (six) AM. Patient not taking: Reported on 06/02/2023 03/02/23 04/01/23  Arrien, Curlee Doss, MD  nitroGLYCERIN   (NITROSTAT ) 0.4 MG SL tablet Place 1 tab (0.4mg  total) under the tongue every 5 minutes as needed for chest pain Patient not taking: Reported on 06/02/2023 02/14/22   Cody Das, MD   Inpatient Medications: Scheduled Meds:  atorvastatin   80 mg Oral QHS   clopidogrel   75 mg Oral Daily   furosemide   80 mg Intravenous BID   heparin   5,000 Units Subcutaneous Q8H   insulin  aspart  0-5 Units Subcutaneous QHS   insulin  aspart  0-9 Units Subcutaneous TID WC   insulin  glargine-yfgn  8 Units Subcutaneous Daily   isosorbide -hydrALAZINE   1 tablet Oral TID   levothyroxine   25 mcg Oral Q0600   sodium chloride  flush  3 mL Intravenous Q12H   Continuous Infusions:  PRN Meds: acetaminophen , hydrALAZINE , ondansetron  (ZOFRAN ) IV, sodium chloride  flush  Allergies:   No Known Allergies  Social History:   Social History   Socioeconomic History   Marital status: Married    Spouse name: Not on file   Number of children: Not on file   Years of education: Not on file   Highest education level: Not on file  Occupational History   Not on file  Tobacco Use   Smoking status: Former    Current packs/day: 0.00    Average packs/day: 0.3 packs/day for 50.0 years (12.5 ttl pk-yrs)    Types: Cigarettes    Start date: 08/1970    Quit date: 08/2020    Years since quitting: 2.8   Smokeless tobacco: Never  Vaping Use   Vaping status: Never Used  Substance and Sexual Activity   Alcohol use: No   Drug use: No   Sexual activity: Not on file  Other Topics Concern   Not on file  Social History Narrative   Not on file   Social Drivers of Health   Financial Resource Strain: Not on file  Food Insecurity: No Food Insecurity (02/26/2023)   Hunger Vital Sign    Worried About Running Out of Food in the Last Year: Never true    Ran Out of Food in the Last Year: Never true  Transportation Needs: No Transportation Needs (02/26/2023)   PRAPARE - Administrator, Civil Service (Medical): No     Lack of Transportation (Non-Medical): No  Physical Activity: Not on file  Stress: Not on file  Social Connections: Not on file  Intimate Partner Violence: Not At Risk (02/26/2023)   Humiliation, Afraid, Rape, and Kick  questionnaire    Fear of Current or Ex-Partner: No    Emotionally Abused: No    Physically Abused: No    Sexually Abused: No    Family History:   Family History  Problem Relation Age of Onset   Diabetes Neg Hx     ROS:  Please see the history of present illness.  All other ROS reviewed and negative.     Physical Exam/Data:   Vitals:   06/03/23 0740 06/03/23 1004 06/03/23 1016 06/03/23 1134  BP: 133/67 (!) 130/50 131/61 (!) 93/49  Pulse: 72   61  Resp: 13   14  Temp: 98 F (36.7 C)   97.6 F (36.4 C)  TempSrc: Oral   Axillary  SpO2: 100%   98%  Weight:      Height:        Intake/Output Summary (Last 24 hours) at 06/03/2023 1329 Last data filed at 06/03/2023 1324 Gross per 24 hour  Intake 560 ml  Output 900 ml  Net -340 ml      06/03/2023    4:10 AM 06/02/2023   12:43 PM 03/15/2023    3:30 PM  Last 3 Weights  Weight (lbs) 147 lb 11.3 oz 147 lb 0.8 oz 125 lb 9.6 oz  Weight (kg) 67 kg 66.7 kg 56.972 kg     Body mass index is 26.17 kg/m.   General:  chronically ill appearing, on 3 L oxygen via [place], drowsy  HEENT: normal Neck: + JVD Vascular: Distal pulses 2+ bilaterally Cardiac:  normal S1, S2; RRR Lungs:  decreased breath sounds, bilateral crackles   Abd: soft, nontender Ext: 2+ bilateral LE edema Musculoskeletal:  bullae present on right LE Skin: warm and dry  Neuro:  no focal abnormalities noted Psych:  depressed affect   EKG:  The EKG was personally reviewed and demonstrates:  sinus rhythm, HR 94, low voltage, LVH, no significant changes from prior EKGs noted  Telemetry:  Telemetry was personally reviewed and demonstrates:  sinus rhythm, HR 60s, 1st degree AVB noted  Relevant CV Studies: Echocardiogram, 02/26/2023 IMPRESSIONS  Left  ventricular ejection fraction, by estimation, is 30 to 35% . The left ventricle has moderately decreased function. The left ventricle demonstrates global hypokinesis. The left ventricular internal cavity size was mildly dilated. Left ventricular diastolic parameters are consistent with Grade III diastolic dysfunction ( restrictive) . Elevated left atrial pressure.  Right ventricular systolic function is moderately reduced. The right ventricular size is normal. Tricuspid regurgitation signal is inadequate for assessing PA pressure.  Left atrial size was mild to moderately dilated.  Large pleural effusion in the left lateral region.  The mitral valve is normal in structure. Moderate to severe mitral valve regurgitation. No evidence of mitral stenosis.  The aortic valve is tricuspid. There is mild calcification of the aortic valve. There is mild thickening of the aortic valve. Aortic valve regurgitation is not visualized. Aortic valve sclerosis/ calcification is present, without any evidence of aortic stenosis.  Comparison(s) : Prior images reviewed side by side. Mitral insufficiency is worse and left heart filling pressures are higher.  Laboratory Data:  High Sensitivity Troponin:   Recent Labs  Lab 05/15/23 1254 05/15/23 1615 06/02/23 0858  TROPONINIHS 18* 19* 21*     Chemistry Recent Labs  Lab 06/02/23 0858 06/03/23 0259  NA 139 143  K 4.3 4.6  CL 109 109  CO2 24 26  GLUCOSE 346* 113*  BUN 42* 46*  CREATININE 3.20* 3.55*  CALCIUM  7.8* 8.0*  GFRNONAA 20* 17*  ANIONGAP 6 8    Recent Labs  Lab 06/02/23 0858  PROT 5.1*  ALBUMIN  <1.5*  AST 21  ALT 16  ALKPHOS 66  BILITOT 0.2   Lipids No results for input(s): "CHOL", "TRIG", "HDL", "LABVLDL", "LDLCALC", "CHOLHDL" in the last 168 hours.  Hematology Recent Labs  Lab 06/02/23 0700  WBC 7.0  RBC 3.92*  HGB 11.6*  HCT 37.7*  MCV 96.2  MCH 29.6  MCHC 30.8  RDW 14.7  PLT 246   Thyroid   Recent Labs  Lab 06/03/23 0259   TSH 9.007*  FREET4 0.80    BNP Recent Labs  Lab 06/02/23 0700  BNP >4,500.0*    DDimer No results for input(s): "DDIMER" in the last 168 hours.  Radiology/Studies:  IR THORACENTESIS ASP PLEURAL SPACE W/IMG GUIDE Result Date: 06/03/2023 INDICATION: 73 year old male with acute on chronic heart failure, presenting to ED with shortness of breath and volume overload, with bilateral pleural effusion, left greater than right. IR was requested for left-sided therapeutic thoracentesis. EXAM: ULTRASOUND GUIDED THERAPEUTIC THORACENTESIS MEDICATIONS: 6 cc of 1% lidocaine  COMPLICATIONS: None immediate. PROCEDURE: An ultrasound guided thoracentesis was thoroughly discussed with the patient and questions answered. The benefits, risks, alternatives and complications were also discussed. The patient understands and wishes to proceed with the procedure. Written consent was obtained. Ultrasound was performed to localize and mark an adequate pocket of fluid in the left chest. The area was then prepped and draped in the normal sterile fashion. 1% Lidocaine  was used for local anesthesia. Under ultrasound guidance a 6 Fr Safe-T-Centesis catheter was introduced. Thoracentesis was performed. The catheter was removed and a dressing applied. FINDINGS: A total of approximately 1.3 L of clear, nearly colorless pleural fluid was removed. IMPRESSION: Successful ultrasound guided left thoracentesis yielding 1.3 of pleural fluid. Procedure performed by Lambert Pillion, PA-C Electronically Signed   By: Nicoletta Barrier M.D.   On: 06/03/2023 12:04   DG Chest 1 View Result Date: 06/03/2023 CLINICAL DATA:  Post thoracentesis on the left. EXAM: CHEST  1 VIEW COMPARISON:  Radiographs 06/02/2023 and 05/15/2023. FINDINGS: 1025 hours. Interval significant improvement in the previously demonstrated left pleural effusion. There are small residual pleural effusions bilaterally with mild atelectasis at both lung bases. No confluent airspace disease or  pneumothorax. The heart size and mediastinal contours are stable post median sternotomy and CABG. No acute osseous findings are evident. IMPRESSION: Interval significant improvement in left pleural effusion post thoracentesis. No pneumothorax. Electronically Signed   By: Elmon Hagedorn M.D.   On: 06/03/2023 11:28   DG Chest Portable 1 View Result Date: 06/02/2023 CLINICAL DATA:  CHF and shortness of breath. EXAM: PORTABLE CHEST 1 VIEW COMPARISON:  05/15/2023 FINDINGS: Previous median sternotomy and CABG procedure. Aortic atherosclerosis. Moderate to large left pleural effusion is unchanged from prior exam. New small right pleural effusion with veil like opacification over the right lower lung. Mildly increased interstitial markings. IMPRESSION: 1. New small right pleural effusion. 2. Unchanged moderate to large left pleural effusion. 3. Mildly increased interstitial markings compatible with pulmonary edema. Electronically Signed   By: Kimberley Penman M.D.   On: 06/02/2023 07:41   Assessment and Plan:   Acute on chronic HFrEF Pleural effusion, bilateral  Patient presented with shortness of breath and LE swelling Currently on 3 L oxygen via [place]  Home medications: PO Lasix  60 mg daily, CoReg  6.25 mg BID, BiDil  20-37.5 mg TID Dispense report shows that patient has filled PO Lasix  but no other medications  recently  BNP > 4,500 CXR showed small right pleural effusion and unchanged moderate to large left pleural effusion with mild pulmonary edema  IR consulted and performed left-sided thoracentesis, removed 1.3 L  Creatinine 3.55 today  Net - 660 cc this admission Weight today 147 lb compared to recent weight of 126 lb 03/2023 Currently on IV Lasix  40 mg BID, CoReg  6.25 mg BID, BiDil  20-37.5 mg TID Increasing IV Lasix  to 80 mg BID -- starting this evening  Held CoReg  for now, given low BP and HR with IV diuresis  Continue daily weights, strict I&O's, daily BMPs  CAD s/p CABG x 3 Hypertension with  hypertensive urgency  Hyperlipidemia 06/15/2022: Direct LDL 214.0 11/27/2022: HDL 44; LDL Cholesterol 323 06/02/2023: ALT 16  Presented with BP as high as 199/115 Most recent BP 131/61 Likely not been taking his home medications, per dispense report  Continue home Plavix  and statin   Goals of care Failure to thrive Noted to be showing signs of failure to thrive when last seen by [facility] in outpatient setting Appears that patient has been noncompliant with medications per dispense report Patients son states that the patient has no drive to do anything during the day  With poor prognostic state and multiple recent admissions, would likely benefit from conversation of goals of care, to be arranged per primary  Would be beneficial to have family in room for conversation   Per primary CKD stage 4  Failure to thrive GERD COPD Diabetes  Anemia Hypothyroidism    For questions or updates, please contact [facility] Please consult [email or web address] for contact info under    Signed, Jiles Mote, PA-C  06/03/2023 1:29 PM

## 2023-06-03 NOTE — TOC CM/SW Note (Addendum)
 Transition of Care [facility]) - Inpatient Brief Assessment   Patient Details  Name: Colin [name] MRN: [id number] Date of Birth: 06/01/1950  [facility] CM/SW Contact:    Jennett Model, RN Phone Number: 06/03/2023, 4:28 PM   Clinical Narrative: Patient speaks some English and [place], From home with family,  has PCP and insurance on file, states has no HH services in place at this time , has walker and a cane at home.  States his son  will transport them home at [employer] and family is support system, states gets medications from [facility].   Pta self ambulatory with walker/cane.  Patient gives this NCM permission to speak with his son.   Transition of Care Asessment: Insurance and Status: Insurance coverage has been reviewed Patient has primary care physician: Yes Home environment has been reviewed: ome with family Prior level of function:: indep with cane Prior/Current Home Services: Current home services (cane [employer]th[employer] Blitz) Social Drivers of Health Review: SDOH reviewed no interventions necessary Readmission risk has been reviewed: Yes Transition of care needs: no transition of care needs at this time

## 2023-06-03 NOTE — Progress Notes (Signed)
 PROGRESS NOTE  Colin [name] WGN:[id number] DOB: [birth year]   PCP: Marius Siemens, NP  Patient is from: Home.  Lives with family.  Sedentary for most part.  DOA: 06/02/2023 LOS: 1  Chief complaints Chief Complaint  Patient presents with   Shortness of Breath   Hypertension     Brief Narrative / Interim history: 73 year old M with PMH of CAD/CABG x 3, combined CHF, DM-2, CKD-4, HTN and HLD presenting with progressive shortness of breath, edema, orthopnea and lower extremity blisters, and admitted with working diagnosis of acute on chronic CHF with large left pleural effusion and hypertensive urgency likely due to noncompliance with medications.  In ED, elevated BP to 119/115.  BNP > 4500.  Troponin 21. Cr 3.2.  Albumin  <1.5.  CXR showed moderate to large left pleural effusion with mild pulmonary edema.  Patient was started on IV ceftriaxone .  IR consulted for thoracocentesis.  The next day, underwent right thoracocentesis with removal of 1.3 L.  Cardiology and palliative consulted.  Subjective: Seen and examined earlier this afternoon with the help of phone interpreter.  Reports improvement in his breathing.  Currently no complaints.  He denies chest pain.   Objective: Vitals:   06/03/23 0740 06/03/23 1004 06/03/23 1016 06/03/23 1134  BP: 133/67 (!) 130/50 131/61 (!) 93/49  Pulse: 72   61  Resp: 13   14  Temp: 98 F (36.7 C)   97.6 F (36.4 C)  TempSrc: Oral   Axillary  SpO2: 100%   98%  Weight:      Height:        Examination:  GENERAL: Appears frail and chronically ill. HEENT: MMM.  Vision and hearing grossly intact.  NECK: Supple.  No apparent JVD.  RESP:  No IWOB.  Fair aeration bilaterally.  Bibasilar rales. CVS:  RRR. Heart sounds normal.  ABD/GI/GU: BS+. Abd soft, NTND.  MSK/EXT:  Moves extremities.  2+ BLE edema. SKIN: no apparent skin lesion or wound NEURO: Awake, alert and oriented appropriately.  No apparent focal neuro deficit. PSYCH: Calm. Normal  affect.   Consultants:  Cardiology Interventional radiology Palliative medicine  Procedures: 4/28-right thoracocentesis with removal of 1.3 L  Microbiology summarized: None  Assessment and plan: Acute on chronic combined CHF: Presents with progressive dyspnea, orthopnea and edema with lower extremity blisters.  Likely due to noncompliance.  TTE 02/2023 with LVEF of 30 to 35%, GH, G2 DD, mod reduced RVF, moderate to severe MR. BNP > 4500.  CXR with large left pleural effusion and pulmonary edema.  Started on IV Lasix  and had about 1.1 L UOP.  Unfortunately his creatinine trended up.  Orthopnea seems to have resolved.  He is lying flat in bed now. - Cardiology consulted.  Appreciate recommendation-continue Lasix  and BiDil  - Strict intake and output, daily weight, renal functions and electrolytes - Overall prognosis not good.  Palliative consulted  Elevated troponin/history of CAD/CABG: No chest pain.  Troponin elevation mild and likely demand ischemia from CHF and delayed clearance from AKI -BiDil  and Lasix  as above - Continue Plavix  and Lipitor   Uncontrolled IDDM-2 with hyperglycemia: A1c 11.6% in 02/2023 Recent Labs  Lab 06/02/23 1507 06/02/23 1833 06/02/23 2101 06/03/23 0617 06/03/23 1130  GLUCAP 230* 150* 102* 102* 136*  - Repeat hemoglobin A1c - Continue Semglee  8 units daily - Continue SSI-sensitive   Moderate to large left pleural effusion: Prior history of this. -S/p thoracocentesis with removal of 1.3 L. - Diuretics as above.  AKI on CKD-4: Baseline Cr ranges  from 2.5-3.1. Cr slightly worse after diuretics.  Cardiorenal? Recent Labs    01/04/23 0255 02/25/23 1325 02/26/23 1725 02/27/23 0233 02/28/23 0240 03/01/23 0243 03/02/23 0834 05/15/23 1037 06/02/23 0858 06/03/23 0259  BUN 64* 18 26* 25* 22 28* 30* 23 42* 46*  CREATININE 4.93* 2.76* 2.78* 2.80* 2.86* 3.15* 3.15* 2.56* 3.20* 3.55*  - Closely monitor while on diuretics.  Hypertensive urgency:  Improved -BiDil  and Lasix  as above  Hypothyroidism: TSH elevated but with normal free T4.  Suspect noncompliance - Continue home Synthroid  - Recheck TFT in 3 to 4 weeks  Anemia of chronic disease: Stable -Continue monitoring  Physical deconditioning -PT/OT eval  Goal of care: Patient with advanced CHF and CKD.  Poor prognosis.  I think he is a candidate for hospice. -Palliative consulted  Body mass index is 26.17 kg/m.          DVT prophylaxis:  heparin  injection 5,000 Units Start: 06/02/23 1400  Code Status: Full code Family Communication: None at bedside Level of care: Telemetry Cardiac Status is: Inpatient Remains inpatient appropriate because: Acute CHF and AKI   Final disposition: To be determined   55 minutes with more than 50% spent in reviewing records, counseling patient/family and coordinating care.   Sch Meds:  Scheduled Meds:  atorvastatin   80 mg Oral QHS   clopidogrel   75 mg Oral Daily   furosemide   80 mg Intravenous BID   heparin   5,000 Units Subcutaneous Q8H   insulin  aspart  0-5 Units Subcutaneous QHS   insulin  aspart  0-9 Units Subcutaneous TID WC   insulin  glargine-yfgn  8 Units Subcutaneous Daily   isosorbide -hydrALAZINE   1 tablet Oral TID   levothyroxine   25 mcg Oral Q0600   sodium chloride  flush  3 mL Intravenous Q12H   Continuous Infusions: PRN Meds:.acetaminophen , hydrALAZINE , ondansetron  (ZOFRAN ) IV, sodium chloride  flush  Antimicrobials: Anti-infectives (From admission, onward)    None        I have personally reviewed the following labs and images: CBC: Recent Labs  Lab 06/02/23 0700  WBC 7.0  HGB 11.6*  HCT 37.7*  MCV 96.2  PLT 246   BMP &GFR Recent Labs  Lab 06/02/23 0858 06/03/23 0259  NA 139 143  K 4.3 4.6  CL 109 109  CO2 24 26  GLUCOSE 346* 113*  BUN 42* 46*  CREATININE 3.20* 3.55*  CALCIUM  7.8* 8.0*   Estimated Creatinine Clearance: 14.9 mL/min (A) (by C-G formula based on SCr of 3.55 mg/dL  (H)). Liver & Pancreas: Recent Labs  Lab 06/02/23 0858  AST 21  ALT 16  ALKPHOS 66  BILITOT 0.2  PROT 5.1*  ALBUMIN  <1.5*   No results for input(s): "LIPASE", "AMYLASE" in the last 168 hours. No results for input(s): "AMMONIA" in the last 168 hours. Diabetic: No results for input(s): "HGBA1C" in the last 72 hours. Recent Labs  Lab 06/02/23 1507 06/02/23 1833 06/02/23 2101 06/03/23 0617 06/03/23 1130  GLUCAP 230* 150* 102* 102* 136*   Cardiac Enzymes: No results for input(s): "CKTOTAL", "CKMB", "CKMBINDEX", "TROPONINI" in the last 168 hours. No results for input(s): "PROBNP" in the last 8760 hours. Coagulation Profile: No results for input(s): "INR", "PROTIME" in the last 168 hours. Thyroid  Function Tests: Recent Labs    06/03/23 0259  TSH 9.007*  FREET4 0.80   Lipid Profile: No results for input(s): "CHOL", "HDL", "LDLCALC", "TRIG", "CHOLHDL", "LDLDIRECT" in the last 72 hours. Anemia Panel: No results for input(s): "VITAMINB12", "FOLATE", "FERRITIN", "TIBC", "IRON", "RETICCTPCT" in the last  72 hours. Urine analysis:    Component Value Date/Time   COLORURINE YELLOW 06/02/2023 0858   APPEARANCEUR CLEAR 06/02/2023 0858   LABSPEC 1.025 06/02/2023 0858   PHURINE 7.0 06/02/2023 0858   GLUCOSEU >=500 (A) 06/02/2023 0858   HGBUR NEGATIVE 06/02/2023 0858   BILIRUBINUR NEGATIVE 06/02/2023 0858   BILIRUBINUR negative 05/06/2020 1113   KETONESUR NEGATIVE 06/02/2023 0858   PROTEINUR >=300 (A) 06/02/2023 0858   UROBILINOGEN 0.2 05/06/2020 1113   UROBILINOGEN 0.2 01/21/2010 1330   NITRITE NEGATIVE 06/02/2023 0858   LEUKOCYTESUR NEGATIVE 06/02/2023 0858   Sepsis Labs: Invalid input(s): "PROCALCITONIN", "LACTICIDVEN"  Microbiology: No results found for this or any previous visit (from the past 240 hours).  Radiology Studies: IR THORACENTESIS ASP PLEURAL SPACE W/IMG GUIDE Result Date: 06/03/2023 INDICATION: 73 year old male with acute on chronic heart failure,  presenting to ED with shortness of breath and volume overload, with bilateral pleural effusion, left greater than right. IR was requested for left-sided therapeutic thoracentesis. EXAM: ULTRASOUND GUIDED THERAPEUTIC THORACENTESIS MEDICATIONS: 6 cc of 1% lidocaine  COMPLICATIONS: None immediate. PROCEDURE: An ultrasound guided thoracentesis was thoroughly discussed with the patient and questions answered. The benefits, risks, alternatives and complications were also discussed. The patient understands and wishes to proceed with the procedure. Written consent was obtained. Ultrasound was performed to localize and mark an adequate pocket of fluid in the left chest. The area was then prepped and draped in the normal sterile fashion. 1% Lidocaine  was used for local anesthesia. Under ultrasound guidance a 6 Fr Safe-T-Centesis catheter was introduced. Thoracentesis was performed. The catheter was removed and a dressing applied. FINDINGS: A total of approximately 1.3 L of clear, nearly colorless pleural fluid was removed. IMPRESSION: Successful ultrasound guided left thoracentesis yielding 1.3 of pleural fluid. Procedure performed by Lambert Pillion, PA-C Electronically Signed   By: Nicoletta Barrier M.D.   On: 06/03/2023 12:04   DG Chest 1 View Result Date: 06/03/2023 CLINICAL DATA:  Post thoracentesis on the left. EXAM: CHEST  1 VIEW COMPARISON:  Radiographs 06/02/2023 and 05/15/2023. FINDINGS: 1025 hours. Interval significant improvement in the previously demonstrated left pleural effusion. There are small residual pleural effusions bilaterally with mild atelectasis at both lung bases. No confluent airspace disease or pneumothorax. The heart size and mediastinal contours are stable post median sternotomy and CABG. No acute osseous findings are evident. IMPRESSION: Interval significant improvement in left pleural effusion post thoracentesis. No pneumothorax. Electronically Signed   By: Elmon Hagedorn M.D.   On: 06/03/2023 11:28       Annakate Soulier T. Lanett Lasorsa [facility]  If 7PM-7AM, please contact night-coverage [email or web address] 06/03/2023, 1:59 PM

## 2023-06-03 NOTE — Procedures (Signed)
 PROCEDURE SUMMARY:  Successful image-guided left-sided therapeutic thoracentesis. Yielded 1.3 liters of clear, near-colorless pleural fluid. Patient tolerated procedure well. EBL: Zero No immediate complications.  Post procedure CXR is pending at this time.  Please see imaging section of Epic for full dictation.  Gordy Lauber Godson Pollan PA-C 06/03/2023 10:29 AM

## 2023-06-03 NOTE — Plan of Care (Signed)
  Problem: Fluid Volume: Goal: Ability to maintain a balanced intake and output will improve Outcome: Progressing   Problem: Coping: Goal: Level of anxiety will decrease Outcome: Progressing   Problem: Safety: Goal: Ability to remain free from injury will improve Outcome: Progressing   Problem: Education: Goal: Ability to describe self-care measures that may prevent or decrease complications (Diabetes Survival Skills Education) will improve Outcome: Not Progressing   Problem: Coping: Goal: Ability to adjust to condition or change in health will improve Outcome: Not Progressing   Problem: Health Behavior/Discharge Planning: Goal: Ability to manage health-related needs will improve Outcome: Not Progressing   Problem: Health Behavior/Discharge Planning: Goal: Ability to manage health-related needs will improve Outcome: Not Progressing   Problem: Activity: Goal: Risk for activity intolerance will decrease Outcome: Not Progressing

## 2023-06-04 ENCOUNTER — Inpatient Hospital Stay (HOSPITAL_COMMUNITY)

## 2023-06-04 DIAGNOSIS — I509 Heart failure, unspecified: Secondary | ICD-10-CM | POA: Diagnosis not present

## 2023-06-04 DIAGNOSIS — J90 Pleural effusion, not elsewhere classified: Secondary | ICD-10-CM | POA: Diagnosis not present

## 2023-06-04 DIAGNOSIS — R7989 Other specified abnormal findings of blood chemistry: Secondary | ICD-10-CM | POA: Diagnosis not present

## 2023-06-04 DIAGNOSIS — N184 Chronic kidney disease, stage 4 (severe): Secondary | ICD-10-CM | POA: Diagnosis not present

## 2023-06-04 DIAGNOSIS — Z7189 Other specified counseling: Secondary | ICD-10-CM

## 2023-06-04 DIAGNOSIS — I160 Hypertensive urgency: Secondary | ICD-10-CM | POA: Diagnosis not present

## 2023-06-04 DIAGNOSIS — I5023 Acute on chronic systolic (congestive) heart failure: Secondary | ICD-10-CM | POA: Diagnosis not present

## 2023-06-04 DIAGNOSIS — Z515 Encounter for palliative care: Secondary | ICD-10-CM

## 2023-06-04 DIAGNOSIS — I2510 Atherosclerotic heart disease of native coronary artery without angina pectoris: Secondary | ICD-10-CM | POA: Diagnosis not present

## 2023-06-04 LAB — CBC
HCT: 31.9 % — ABNORMAL LOW (ref 39.0–52.0)
Hemoglobin: 10.1 g/dL — ABNORMAL LOW (ref 13.0–17.0)
MCH: 30 pg (ref 26.0–34.0)
MCHC: 31.7 g/dL (ref 30.0–36.0)
MCV: 94.7 fL (ref 80.0–100.0)
Platelets: 210 10*3/uL (ref 150–400)
RBC: 3.37 MIL/uL — ABNORMAL LOW (ref 4.22–5.81)
RDW: 14.6 % (ref 11.5–15.5)
WBC: 6 10*3/uL (ref 4.0–10.5)
nRBC: 0 % (ref 0.0–0.2)

## 2023-06-04 LAB — BASIC METABOLIC PANEL WITH GFR
Anion gap: 10 (ref 5–15)
BUN: 55 mg/dL — ABNORMAL HIGH (ref 8–23)
CO2: 25 mmol/L (ref 22–32)
Calcium: 7.9 mg/dL — ABNORMAL LOW (ref 8.9–10.3)
Chloride: 104 mmol/L (ref 98–111)
Creatinine, Ser: 4 mg/dL — ABNORMAL HIGH (ref 0.61–1.24)
GFR, Estimated: 15 mL/min — ABNORMAL LOW (ref >60)
Glucose, Bld: 103 mg/dL — ABNORMAL HIGH (ref 70–99)
Potassium: 4.6 mmol/L (ref 3.5–5.1)
Sodium: 139 mmol/L (ref 135–145)

## 2023-06-04 LAB — GLUCOSE, CAPILLARY
Glucose-Capillary: 177 mg/dL — ABNORMAL HIGH (ref 70–99)
Glucose-Capillary: 131 mg/dL — ABNORMAL HIGH (ref 70–99)
Glucose-Capillary: 227 mg/dL — ABNORMAL HIGH (ref 70–99)
Glucose-Capillary: 256 mg/dL — ABNORMAL HIGH (ref 70–99)

## 2023-06-04 LAB — BRAIN NATRIURETIC PEPTIDE: B Natriuretic Peptide: 2845.4 pg/mL — ABNORMAL HIGH (ref 0.0–100.0)

## 2023-06-04 LAB — MAGNESIUM: Magnesium: 2.2 mg/dL (ref 1.7–2.4)

## 2023-06-04 MED ORDER — FUROSEMIDE 10 MG/ML IJ SOLN
80.0000 mg | Freq: Every day | INTRAMUSCULAR | Status: DC
  Administered 2023-06-04 – 2023-06-05 (×2): 80 mg via INTRAVENOUS
  Filled 2023-06-04 (×2): qty 8

## 2023-06-04 NOTE — Evaluation (Signed)
 Occupational Therapy Evaluation Patient Details Name: Colin [name] MRN: [id number] DOB: 1950/03/14 Today's Date: 06/04/2023   History of Present Illness   Pt is a 73 yr old male who presented 06/02/23 due to SOB and HTN. CXR showed moderate to large left pleural effusion with mild pulmonary edema. Pt s/p 06/03/23 thoracocentesis.  PMH includes: CAD s/p stent x2, DM II, NSTEMI, HTN     Clinical Impressions Pt reported not completion of much activity prior to visit. At this time was limited due to pain in RLE with WB and blisters. Pt was able to complete bed mob with mod I  and sit to stand transfer with CGA to min assist depending on surface height. He then agreed to step pivot to chair with CGA to min assist. At this time pt set up with UE ADLS and CGA to min assist with LE ADLS due to pain and endurance. At the time of evaluation the pt's son was called and agreeable for [facility] services with the return to home.      If plan is discharge home, recommend the following:   A little help with walking and/or transfers;A little help with bathing/dressing/bathroom;Assistance with cooking/housework;Direct supervision/assist for medications management;Direct supervision/assist for financial management;Assist for transportation;Help with stairs or ramp for entrance     Functional Status Assessment   Patient has had a recent decline in their functional status and demonstrates the ability to make significant improvements in function in a reasonable and predictable amount of time.     Equipment Recommendations   Tub/shower bench     Recommendations for Other Services         Precautions/Restrictions   Precautions Precautions: Fall Recall of Precautions/Restrictions: Intact Restrictions Weight Bearing Restrictions Per Provider Order: No     Mobility Bed Mobility Overal bed mobility: Modified Independent             General bed mobility comments: HOB elevated and increase in time  completed    Transfers Overall transfer level: Needs assistance Equipment used: None Transfers: Sit to/from Stand Sit to Stand: Contact guard assist           General transfer comment: due to RLE pain limited ambulation at this time      Balance Overall balance assessment: Needs assistance Sitting-balance support: Feet supported Sitting balance-Leahy Scale: Good     Standing balance support: Bilateral upper extremity supported Standing balance-Leahy Scale: Poor Standing balance comment: due to RLE pain                           ADL either performed or assessed with clinical judgement   ADL Overall ADL's : Needs assistance/impaired Eating/Feeding: Independent;Sitting   Grooming: Wash/dry hands;Set up;Sitting   Upper Body Bathing: Set up;Sitting   Lower Body Bathing: Minimal assistance;Sitting/lateral leans;Sit to/from stand   Upper Body Dressing : Set up;Sitting   Lower Body Dressing: Minimal assistance;Sit to/from stand   Toilet Transfer: Contact guard assist;Cueing for sequencing;Cueing for safety Toilet Transfer Details (indicate cue type and reason): step pivot to chair to simulate Toileting- Clothing Manipulation and Hygiene: Contact guard assist;Minimal assistance;Sit to/from stand;Sitting/lateral lean       Functional mobility during ADLs: Contact guard assist       Vision Baseline Vision/History: 1 Wears glasses Ability to See in Adequate Light: 0 Adequate Patient Visual Report: No change from baseline Vision Assessment?: Wears glasses for reading;Vision impaired- to be further tested in functional context (Per pt reporting  on how he needs glasses)     Perception Perception: Within Functional Limits       Praxis Praxis: WFL       Pertinent Vitals/Pain Pain Assessment Pain Assessment: Faces Faces Pain Scale: Hurts even more Pain Location: RLE edema/ blisters and when WB Pain Descriptors / Indicators: Grimacing, Guarding Pain  Intervention(s): Limited activity within patient's tolerance, Monitored during session     Extremity/Trunk Assessment Upper Extremity Assessment Upper Extremity Assessment: Overall WFL for tasks assessed   Lower Extremity Assessment Lower Extremity Assessment: Defer to PT evaluation   Cervical / Trunk Assessment Cervical / Trunk Assessment: Kyphotic   Communication Communication Communication: No apparent difficulties Factors Affecting Communication:  (non-English speaking)   Cognition Arousal: Alert Behavior During Therapy: WFL for tasks assessed/performed Cognition: No apparent impairments                               Following commands: Intact       Cueing  General Comments   Cueing Techniques: Verbal cues  blisters on RLE   Exercises     Shoulder Instructions      Home Living Family/patient expects to be discharged to:: Private residence Living Arrangements: Spouse/significant other;Other relatives Available Help at Discharge: Family;Available 24 hours/day Type of Home: House Home Access: Stairs to enter [employer] of Steps: 1 Entrance Stairs-Rails: None Home Layout: Multi-level;Able to live on main level with bedroom/bathroom Alternate Level Stairs-Number of Steps: flight   Bathroom Shower/Tub: Chief Strategy Officer: Standard     Home Equipment: Agricultural consultant (2 wheels);Shower seat;Rollator (4 wheels);Cane - single point;Wheelchair - manual          Prior Functioning/Environment Prior Level of Function : Independent/Modified Independent;Driving             Mobility Comments: Pt reporting not completion of a lot of activity recenly      OT Problem List: Decreased strength;Decreased range of motion;Decreased activity tolerance;Impaired balance (sitting and/or standing);Decreased safety awareness;Decreased knowledge of use of DME or AE;Cardiopulmonary status limiting activity;Pain   OT  Treatment/Interventions: Self-care/ADL training;Therapeutic exercise;DME and/or AE instruction;Therapeutic activities;Visual/perceptual remediation/compensation;Patient/family education      OT Goals(Current goals can be found in the care plan section)   Acute Rehab OT Goals Patient Stated Goal: to get better OT Goal Formulation: With patient Time For Goal Achievement: 06/18/23 Potential to Achieve Goals: Good   OT Frequency:  Min 2X/week    Co-evaluation              AM-PAC OT "6 Clicks" Daily Activity     Outcome Measure Help from another person eating meals?: None Help from another person taking care of personal grooming?: A Little Help from another person toileting, which includes using toliet, bedpan, or urinal?: A Little Help from another person bathing (including washing, rinsing, drying)?: A Little Help from another person to put on and taking off regular upper body clothing?: None Help from another person to put on and taking off regular lower body clothing?: A Little 6 Click Score: 20   End of Session Equipment Utilized During Treatment: Gait belt Nurse Communication: Mobility status  Activity Tolerance: Patient limited by fatigue Patient left: in chair;with call bell/phone within reach;with chair alarm set  OT Visit Diagnosis: Unsteadiness on feet (R26.81);Other abnormalities of gait and mobility (R26.89);Muscle weakness (generalized) (M62.81);Pain Pain - Right/Left: Right Pain - part of body: Leg  Time: 1610-9604 OT Time Calculation (min): 32 min Charges:  OT General Charges $OT Visit: 1 Visit OT Evaluation $OT Eval Low Complexity: 1 Low OT Treatments $Self Care/Home Management : 8-22 mins  Erving Heather OTR/L  Acute Rehab Services  [phone] office number   Stevphen Elders 06/04/2023, 8:14 AM

## 2023-06-04 NOTE — Plan of Care (Signed)
  Problem: Education: Goal: Ability to describe self-care measures that may prevent or decrease complications (Diabetes Survival Skills Education) will improve Outcome: Progressing   Problem: Coping: Goal: Ability to adjust to condition or change in health will improve Outcome: Progressing   Problem: Fluid Volume: Goal: Ability to maintain a balanced intake and output will improve Outcome: Progressing   Problem: Metabolic: Goal: Ability to maintain appropriate glucose levels will improve Outcome: Progressing   Problem: Nutritional: Goal: Maintenance of adequate nutrition will improve Outcome: Progressing Goal: Progress toward achieving an optimal weight will improve Outcome: Progressing   Problem: Clinical Measurements: Goal: Respiratory complications will improve Outcome: Progressing   Problem: Health Behavior/Discharge Planning: Goal: Ability to manage health-related needs will improve Outcome: Not Progressing

## 2023-06-04 NOTE — Progress Notes (Addendum)
 PROGRESS NOTE  Colin [name] ZOX:[id number] DOB: Apr 24, 1950   PCP: Marius Siemens, NP  Patient is from: Home.  Lives with family.  Sedentary for most part.  DOA: 06/02/2023 LOS: 2  Chief complaints Chief Complaint  Patient presents with   Shortness of Breath   Hypertension     Brief Narrative / Interim history: 73 year old M with PMH of CAD/CABG x 3, combined CHF, DM-2, CKD-4, HTN and HLD presenting with progressive shortness of breath, edema, orthopnea and lower extremity blisters, and admitted with working diagnosis of acute on chronic CHF with large left pleural effusion and hypertensive urgency likely due to noncompliance with medications.  In ED, elevated BP to 119/115.  BNP > 4500.  Troponin 21. Cr 3.2.  Albumin  <1.5.  CXR showed moderate to large left pleural effusion with mild pulmonary edema.  Patient was started on IV ceftriaxone .  IR consulted for thoracocentesis.  The next day, underwent right thoracocentesis with removal of 1.3 L.  Cardiology and palliative consulted.  Remains on IV Lasix .  Creatinine slightly up.  Subjective: Seen and examined earlier this afternoon with the help of in house interpreter.  Patient was sitting on bedside chair earlier this morning.  He felt short of breath and orthopnea after lying in bed later.  He denies chest pain.  Lengthy discussion about goal of care with focus on CODE STATUS.  We have discussed pros and cons of CPR or intubation in light of his advanced heart failure and renal failure.  I honestly recommended DNR and DNI but he is undecided.  Attempted to call patient's son over the phone earlier in the morning but no answer.  Palliative meeting patient shortly.  Objective: Vitals:   06/04/23 0746 06/04/23 1110 06/04/23 1112 06/04/23 1114  BP: 135/71  (!) 152/68   Pulse:   66   Resp: 20 20 20    Temp:   (!) 97.4 F (36.3 C)   TempSrc:  Oral Oral Oral  SpO2: 98%  94%   Weight:      Height:        Examination:  GENERAL:  Appears frail and chronically ill. HEENT: MMM.  Vision and hearing grossly intact.  NECK: Supple.  No apparent JVD.  RESP:  No IWOB.  Fair aeration bilaterally.  Bibasilar rales. CVS:  RRR. Heart sounds normal.  ABD/GI/GU: BS+. Abd soft, NTND.  MSK/EXT:  Moves extremities.  2+ BLE edema. SKIN: no apparent skin lesion or wound NEURO: Awake, alert and oriented appropriately.  No apparent focal neuro deficit. PSYCH: Calm. Normal affect.   Consultants:  Cardiology Interventional radiology Palliative medicine  Procedures: 4/28-right thoracocentesis with removal of 1.3 L  Microbiology summarized: None  Assessment and plan: Acute on chronic combined CHF: Presents with progressive dyspnea, orthopnea and edema with lower extremity blisters.  Likely due to noncompliance.  TTE 02/2023 with LVEF of 30 to 35%, GH, G2 DD, mod reduced RVF, moderate to severe MR. BNP > 4500.  CXR with large left pleural effusion and pulmonary edema.  Started on IV Lasix  and had about 2.0 L UOP.  Net -1800 cc charted.  Creatinine trended up.  Still with significant edema, dyspnea and orthopnea.  Poor prognosis. -Appreciate help by cardiology -Decreased IV Lasix  to 80 mg daily given renal failure -Strict intake and output, daily weight, renal functions and electrolytes -Overall prognosis not good.  Palliative meeting with patient today.  Elevated troponin/history of CAD/CABG: No chest pain.  Troponin elevation mild and likely demand ischemia from  CHF and delayed clearance from AKI -BiDil  and Lasix  as above -Continue Plavix  and Lipitor   Uncontrolled IDDM-2 with hyperglycemia: A1c 11.6% in 02/2023 Recent Labs  Lab 06/03/23 1130 06/03/23 1615 06/03/23 2104 06/04/23 0610 06/04/23 1124  GLUCAP 136* 200* 177* 177* 131*  - Repeat hemoglobin A1c - Continue Semglee  8 units daily - Continue SSI-sensitive  Moderate to large left pleural effusion: Prior history of this. -S/p thoracocentesis with removal of 1.3  L. -Diuretics as above.  AKI on CKD-4: Baseline Cr ranges from 2.5-3.1. Cr worse today.  Cardiorenal? Recent Labs    02/25/23 1325 02/26/23 1725 02/27/23 0233 02/28/23 0240 03/01/23 0243 03/02/23 0834 05/15/23 1037 06/02/23 0858 06/03/23 0259 06/04/23 0302  BUN 18 26* 25* 22 28* 30* 23 42* 46* 55*  CREATININE 2.76* 2.78* 2.80* 2.86* 3.15* 3.15* 2.56* 3.20* 3.55* 4.00*  -Decrease IV Lasix  to 80 mg daily -Closely monitor while on diuretics.  Hypertensive urgency: Improved -BiDil  and Lasix  as above  Hypothyroidism: TSH elevated but with normal free T4.  Suspect noncompliance - Continue home Synthroid  - Recheck TFT in 3 to 4 weeks  Anemia of chronic disease: Stable -Continue monitoring  Physical deconditioning -PT/OT eval  Goal of care discussion: Patient with advanced CHF and CKD.  Poor prognosis.  I had lengthy discussion about pros and cons of CPR and intubation in light of his comorbidity.  Patient is undecided.  Not able to reach his son.  - Palliative meeting with patient  Body mass index is 25.38 kg/m.          DVT prophylaxis:  heparin  injection 5,000 Units Start: 06/02/23 1400  Code Status: Full code Family Communication: Attempted to call patient's son over the phone but no answer. Level of care: Telemetry Cardiac Status is: Inpatient Remains inpatient appropriate because: Acute CHF and AKI   Final disposition: To be determined   55 minutes with more than 50% spent in reviewing records, counseling patient/family and coordinating care.   Sch Meds:  Scheduled Meds:  atorvastatin   80 mg Oral QHS   clopidogrel   75 mg Oral Daily   furosemide   80 mg Intravenous Daily   heparin   5,000 Units Subcutaneous Q8H   insulin  aspart  0-5 Units Subcutaneous QHS   insulin  aspart  0-9 Units Subcutaneous TID WC   insulin  glargine-yfgn  8 Units Subcutaneous Daily   isosorbide -hydrALAZINE   1 tablet Oral TID   levothyroxine   25 mcg Oral Q0600   sodium chloride   flush  3 mL Intravenous Q12H   Continuous Infusions: PRN Meds:.acetaminophen , hydrALAZINE , ondansetron  (ZOFRAN ) IV, sodium chloride  flush  Antimicrobials: Anti-infectives (From admission, onward)    None        I have personally reviewed the following labs and images: CBC: Recent Labs  Lab 06/02/23 0700 06/04/23 0302  WBC 7.0 6.0  HGB 11.6* 10.1*  HCT 37.7* 31.9*  MCV 96.2 94.7  PLT 246 210   BMP &GFR Recent Labs  Lab 06/02/23 0858 06/03/23 0259 06/04/23 0302  NA 139 143 139  K 4.3 4.6 4.6  CL 109 109 104  CO2 24 26 25   GLUCOSE 346* 113* 103*  BUN 42* 46* 55*  CREATININE 3.20* 3.55* 4.00*  CALCIUM  7.8* 8.0* 7.9*  MG  --   --  2.2   Estimated Creatinine Clearance: 13.2 mL/min (A) (by C-G formula based on SCr of 4 mg/dL (H)). Liver & Pancreas: Recent Labs  Lab 06/02/23 0858  AST 21  ALT 16  ALKPHOS 66  BILITOT 0.2  PROT 5.1*  ALBUMIN  <1.5*   No results for input(s): "LIPASE", "AMYLASE" in the last 168 hours. No results for input(s): "AMMONIA" in the last 168 hours. Diabetic: No results for input(s): "HGBA1C" in the last 72 hours. Recent Labs  Lab 06/03/23 1130 06/03/23 1615 06/03/23 2104 06/04/23 0610 06/04/23 1124  GLUCAP 136* 200* 177* 177* 131*   Cardiac Enzymes: No results for input(s): "CKTOTAL", "CKMB", "CKMBINDEX", "TROPONINI" in the last 168 hours. No results for input(s): "PROBNP" in the last 8760 hours. Coagulation Profile: No results for input(s): "INR", "PROTIME" in the last 168 hours. Thyroid  Function Tests: Recent Labs    06/03/23 0259  TSH 9.007*  FREET4 0.80   Lipid Profile: No results for input(s): "CHOL", "HDL", "LDLCALC", "TRIG", "CHOLHDL", "LDLDIRECT" in the last 72 hours. Anemia Panel: No results for input(s): "VITAMINB12", "FOLATE", "FERRITIN", "TIBC", "IRON", "RETICCTPCT" in the last 72 hours. Urine analysis:    Component Value Date/Time   COLORURINE YELLOW 06/02/2023 0858   APPEARANCEUR CLEAR 06/02/2023 0858    LABSPEC 1.025 06/02/2023 0858   PHURINE 7.0 06/02/2023 0858   GLUCOSEU >=500 (A) 06/02/2023 0858   HGBUR NEGATIVE 06/02/2023 0858   BILIRUBINUR NEGATIVE 06/02/2023 0858   BILIRUBINUR negative 05/06/2020 1113   KETONESUR NEGATIVE 06/02/2023 0858   PROTEINUR >=300 (A) 06/02/2023 0858   UROBILINOGEN 0.2 05/06/2020 1113   UROBILINOGEN 0.2 01/21/2010 1330   NITRITE NEGATIVE 06/02/2023 0858   LEUKOCYTESUR NEGATIVE 06/02/2023 0858   Sepsis Labs: Invalid input(s): "PROCALCITONIN", "LACTICIDVEN"  Microbiology: No results found for this or any previous visit (from the past 240 hours).  Radiology Studies: No results found.     Macaiah Mangal T. Dillinger [facility]  If 7PM-7AM, please contact night-coverage [email or web address] 06/04/2023, 12:27 PM

## 2023-06-04 NOTE — Plan of Care (Signed)
  Problem: Coping: Goal: Ability to adjust to condition or change in health will improve Outcome: Progressing   Problem: Fluid Volume: Goal: Ability to maintain a balanced intake and output will improve Outcome: Progressing   Problem: Health Behavior/Discharge Planning: Goal: Ability to identify and utilize available resources and services will improve Outcome: Progressing   Problem: Tissue Perfusion: Goal: Adequacy of tissue perfusion will improve Outcome: Progressing   Problem: Clinical Measurements: Goal: Will remain free from infection Outcome: Progressing Goal: Respiratory complications will improve Outcome: Progressing Goal: Cardiovascular complication will be avoided Outcome: Progressing   Problem: Elimination: Goal: Will not experience complications related to bowel motility Outcome: Progressing Goal: Will not experience complications related to urinary retention Outcome: Progressing   Problem: Safety: Goal: Ability to remain free from injury will improve Outcome: Progressing

## 2023-06-04 NOTE — Consult Note (Signed)
 Consultation Note Date: 06/04/2023   Patient Name: Colin [name]  DOB: Sep 26, 1950  MRN: [id number]  Age / Sex: 73 y.o., male  PCP: Marius Siemens, NP Referring Physician: Theadore Finger, MD  Reason for Consultation: Establishing goals of care  HPI/Patient Profile: 73 y.o. male   admitted on 06/02/2023 with   PMH of CAD/CABG x 3, combined CHF, DM-2, CKD-4, HTN and HLD presenting with progressive shortness of breath, edema, orthopnea and lower extremity blisters, and admitted with working diagnosis of acute on chronic CHF with large left pleural effusion and hypertensive urgency likely due to noncompliance with medications.   In ED, elevated BP to 119/115.  BNP > 4500.  Troponin 21. Cr 3.2.  Albumin  <1.5.  CXR showed moderate to large left pleural effusion with mild pulmonary edema.  Patient was started on IV ceftriaxone .  IR consulted for thoracocentesis.   The next day, underwent right thoracocentesis with removal of 1.3 L.  Cardiology and palliative consulted. Clinical Assessment and Goals of Care:  This NP Thena Fireman reviewed medical records, received report from team, assessed the patient and then meet at the patient's bedside  to discuss diagnosis, prognosis, GOC, EOL wishes disposition and options.     Interpretor for entirety of meeting  Concept of Palliative Care was introduced as specialized medical care for people and their families living with serious illness.  If focuses on providing relief from the symptoms and stress of a serious illness.  The goal is to improve quality of life for both the patient and the family. Values and goals of care important to patient and family were attempted to be elicited.  It became evident that patient was confused and unable to participate in today's conversation, this was a  cognitive barrier not a language barrier.  I was able to speak with his  granddaughter/Allison by phone and explained the above concepts as it relates to the PMT and advance care planning.   She and her father/patient's son agree to meet with me tomorrow morning at 9 AM.  I recommended interpreter services however family decline and are securing that decision.   I prepared Margretta Shi that tomorrow's conversation will reflect them below concepts A  discussion was had today regarding advanced directives.  Concepts specific to code status, artifical feeding and hydration, continued IV antibiotics and rehospitalization was had and  the difference between a aggressive medical intervention path  and a palliative comfort care path for this patient at this time     Questions and concerns addressed.      PMT will continue to support holistically.             NEXT OF KIN    SUMMARY OF RECOMMENDATIONS    Code Status/Advance Care Planning: Full code   Symptom Management:  ***  Palliative Prophylaxis:  Delirium Protocol and Frequent Pain Assessment  Additional Recommendations (Limitations, Scope, Preferences): Full scope  Psycho-social/Spiritual:  Desire for further Chaplaincy support:no Additional Recommendations: Interpretor services  Prognosis:  Unable to determine  Discharge Planning: To Be Determined      Primary Diagnoses: Present on Admission:  Acute on chronic systolic (congestive) heart failure (HCC)  Acute respiratory distress  Hypertensive urgency  Elevated troponin  Coronary artery disease  Hypothyroidism  HLD (hyperlipidemia)  CKD (chronic kidney disease), stage IV (HCC)  Uncontrolled type 2 diabetes mellitus with hyperglycemia (HCC)  Normocytic anemia   I have reviewed the medical record, interviewed the patient and family, and examined the patient. The following aspects are pertinent.  Past Medical History:  Diagnosis Date   Acute hypoxic respiratory failure (HCC) 12/16/2021   Acute pulmonary edema (HCC)    Arrhythmia     CAD (coronary artery disease)    a. s/p stent x 2; b. 08/2020 CABG x 3 (LIMA->LAD, VG->PDA, VG->OM1).   Carotid artery occlusion    Chronic combined systolic and diastolic heart failure (HCC)    Chronic HFrEF (heart failure with reduced ejection fraction) (HCC)    a. 11/2022 Echo: EF 25-30%, glob HK, GrI DD, nl RV fxn, mod dil LA, mild-mod MR.   Diabetes mellitus    Dyslipidemia    Essential hypertension    Ischemic cardiomyopathy    a. 11/2022 Echo: EF 25-30%.   Left upper quadrant abdominal pain 05/30/2022   NSTEMI (non-ST elevated myocardial infarction) [facility])    Social History   Socioeconomic History   Marital status: Married    Spouse name: Not on file   Number of children: Not on file   Years of education: Not on file   Highest education level: Not on file  Occupational History   Not on file  Tobacco Use   Smoking status: Former    Current packs/day: 0.00    Average packs/day: 0.3 packs/day for 50.0 years (12.5 ttl pk-yrs)    Types: Cigarettes    Start date: 08/1970    Quit date: 08/2020    Years since quitting: 2.8   Smokeless tobacco: Never  Vaping Use   Vaping status: Never Used  Substance and Sexual Activity   Alcohol use: No   Drug use: No   Sexual activity: Not on file  Other Topics Concern   Not on file  Social History Narrative   Not on file   Social Drivers of Health   Financial Resource Strain: Not on file  Food Insecurity: No Food Insecurity (06/03/2023)   Hunger Vital Sign    Worried About Running Out of Food in the Last Year: Never true    Ran Out of Food in the Last Year: Never true  Transportation Needs: No Transportation Needs (06/03/2023)   PRAPARE - Administrator, Civil Service (Medical): No    Lack of Transportation (Non-Medical): No  Physical Activity: Not on file  Stress: Not on file  Social Connections: Moderately Integrated (06/03/2023)   Social Connection and Isolation Panel [NHANES]    Frequency of Communication with  Friends and Family: More than three times a week    Frequency of Social Gatherings with Friends and Family: More than three times a week    Attends Religious Services: More than 4 times per year    Active Member of [employer] or Organizations: Yes    Attends Banker Meetings: Never    Marital Status: Widowed   Family History  Problem Relation Age of Onset   Diabetes Neg Hx    Scheduled Meds:  atorvastatin   80 mg Oral QHS   clopidogrel   75 mg Oral Daily   furosemide   80 mg Intravenous Daily   heparin   5,000 Units Subcutaneous Q8H   insulin  aspart  0-5 Units Subcutaneous QHS   insulin  aspart  0-9 Units Subcutaneous TID WC   insulin  glargine-yfgn  8 Units Subcutaneous Daily   isosorbide -hydrALAZINE   1 tablet Oral TID   levothyroxine   25 mcg Oral Q0600   sodium chloride  flush  3 mL Intravenous Q12H   Continuous Infusions: PRN Meds:.acetaminophen , hydrALAZINE , ondansetron  (ZOFRAN ) IV, sodium chloride  flush Medications Prior to Admission:  Prior to Admission medications   Medication Sig Start Date End Date Taking? Authorizing Provider  furosemide  (LASIX ) 20 MG tablet Take 3 tablets (60 mg total) by mouth daily. 05/15/23  Yes Robinson, John K, PA-C  potassium chloride  SA (KLOR-CON  M) 20 MEQ tablet Take 1 tablet (20 mEq total) by mouth 2 (two) times daily for 10 days. Patient taking differently: Take 20 mEq by mouth daily. 05/15/23 06/02/23 Yes Robinson, John K, PA-C  atorvastatin  (LIPITOR ) 80 MG tablet Take 1 tablet (80 mg total) by mouth daily. Patient not taking: Reported on 06/02/2023 03/02/23   Arrien, Curlee Doss, MD  carvedilol  (COREG ) 6.25 MG tablet Take 1 tablet (6.25 mg total) by mouth 2 (two) times daily with a meal. Patient not taking: Reported on 06/02/2023 03/02/23 04/01/23  Arrien, Curlee Doss, MD  clopidogrel  (PLAVIX ) 75 MG tablet Take 1 tablet (75 mg total) by mouth daily. Patient not taking: Reported on 06/02/2023 03/02/23   Arrien, Curlee Doss, MD  Continuous  Glucose Receiver (FREESTYLE LIBRE 3 READER) DEVI Use as directed! 02/06/22   Lajean Pike, MD  Continuous Glucose Sensor (FREESTYLE LIBRE 3 SENSOR) MISC Use as instructed change every 14 days 08/01/22   Motwani, Komal, MD  insulin  glargine (LANTUS ) 100 UNIT/ML Solostar Pen Inject 8 Units into the skin at bedtime. Patient not taking: Reported on 06/02/2023 03/02/23 04/08/23  Arrien, Curlee Doss, MD  insulin  lispro (ADMELOG  SOLOSTAR) 100 UNIT/ML KwikPen Inject 3 Units into the skin 3 (three) times daily with meals. Patient not taking: Reported on 06/02/2023 03/02/23   Arrien, Curlee Doss, MD  Insulin  Pen Needle (PEN NEEDLES 3/16") 31G X 5 MM MISC Uses three times daily with insulin . 05/24/22   Shamleffer, Ibtehal Jaralla, MD  Insulin  Pen Needle 32G X 4 MM MISC Use as directed with Humalog  and Lantus  pens. 12/08/22   Arrien, Mauricio Daniel, MD  isosorbide -hydrALAZINE  (BIDIL ) 20-37.5 MG tablet Take 1 tablet by mouth 3 (three) times daily. Patient not taking: Reported on 06/02/2023 03/02/23   Arrien, Curlee Doss, MD  levothyroxine  (SYNTHROID ) 25 MCG tablet Take 1 tablet (25 mcg total) by mouth daily at 6 (six) AM. Patient not taking: Reported on 06/02/2023 03/02/23 04/01/23  Arrien, Curlee Doss, MD  nitroGLYCERIN  (NITROSTAT ) 0.4 MG SL tablet Place 1 tab (0.4mg  total) under the tongue every 5 minutes as needed for chest pain Patient not taking: Reported on 06/02/2023 02/14/22   Cody Das, MD   No Known Allergies Review of Systems  Respiratory:  Positive for shortness of breath.     Physical Exam  Vital Signs: BP (!) 152/68 (BP Location: Right Arm)   Pulse 66   Temp (!) 97.4 F (36.3 C) (Oral)   Resp 20   Ht 5\' 3"  (1.6 m)   Wt 65 kg   SpO2 94%   BMI 25.38 kg/m  Pain Scale: Faces   Pain Score: Asleep   SpO2: SpO2: 94 % O2 Device:SpO2: 94 % O2 Flow Rate: .O2 Flow Rate (L/min): 2 L/min  IO: Intake/output  summary:  Intake/Output Summary (Last 24 hours) at 06/04/2023 1315 Last  data filed at 06/04/2023 1114 Gross per 24 hour  Intake 1000 ml  Output 2350 ml  Net -1350 ml    LBM: Last BM Date : 06/04/23 Baseline Weight: Weight: 66.7 kg Most recent weight: Weight: 65 kg     Palliative Assessment/Data:    Time: 60 minutes   Signed by: Thena Fireman, NP   Please contact Palliative Medicine Team phone at [phone] for questions and concerns.  For individual provider: See Tilford Foley

## 2023-06-04 NOTE — TOC Progression Note (Signed)
 Transition of Care [facility]) - Progression Note    Patient Details  Name: IMARI WOLBECK MRN: [id number] Date of Birth: [birth year]  [facility] CM/SW Contact  Jennett Model, RN Phone Number: 06/04/2023, 3:49 PM  Clinical Narrative:    NCM offered choice to patient, he states to call either of his sons,  NCM contacted [name], offered choice, he states he has no preference , NCM made referral to [facility] with [facility], he is able to take the referral.  Soc will begin 24 to 48 hrs post dc.  [name] states that he thinks they already have a tub shower bench so does not want to order one.         Expected Discharge Plan and Services                                               Social Determinants of Health (SDOH) Interventions SDOH Screenings   Food Insecurity: No Food Insecurity (06/03/2023)  Housing: Low Risk  (06/03/2023)  Transportation Needs: No Transportation Needs (06/03/2023)  Utilities: Not At Risk (06/03/2023)  Depression (PHQ2-9): High Risk (01/11/2022)  Social Connections: Moderately Integrated (06/03/2023)  Tobacco Use: Medium Risk (06/02/2023)    Readmission Risk Interventions    06/03/2023    4:32 PM 01/01/2023   12:57 PM 12/07/2022    4:35 PM  Readmission Risk Prevention Plan  Transportation Screening Complete Complete Complete  PCP or Specialist Appt within 5-7 Days   Complete  PCP or Specialist Appt within 3-5 Days  Complete   Home Care Screening   Complete  Medication Review (RN CM)   Complete  HRI or Home Care Consult  Complete   Palliative Care Screening  Not Applicable   Medication Review (RN Care Manager) Complete Complete   PCP or Specialist appointment within 3-5 days of discharge Complete    HRI or Home Care Consult Complete    Palliative Care Screening Complete    Skilled Nursing Facility Not Applicable

## 2023-06-04 NOTE — Evaluation (Signed)
 Physical Therapy Evaluation Patient Details Name: Colin [name] MRN: [id number] DOB: 07-15-1950 Today's Date: 06/04/2023  History of Present Illness  Pt is a 73 yr old male who presented 06/02/23 due to SOB and HTN. CXR showed moderate to large left pleural effusion with mild pulmonary edema. Pt s/p 06/03/23 thoracocentesis.  PMH includes: CAD s/p stent x2, DM II, NSTEMI, HTN  Clinical Impression  PTA pt living with wife, son and son's family, in split level home with 1 step in and 1 step to his bed and bath. Pt reports independence with household ambulation with cane, and driving, pt reports independence with ADLs, family provides for iADLs. Pt is currently limited by SoB and increased O2 demand, and painful blisters on his R LE, in presence of decreased strength balance and endurance. Pt reports breathing is better in recliner and is agreeable to sit back up in recliner. Pt is mod I for bed mobility and CGA for stepping transfer from bed to chair with SPC. PT recommending HHPT on discharge. PT will continue to follow acutely.         If plan is discharge home, recommend the following: A little help with walking and/or transfers;A little help with bathing/dressing/bathroom;Assistance with cooking/housework;Direct supervision/assist for medications management;Direct supervision/assist for financial management;Assist for transportation;Help with stairs or ramp for entrance   Can travel by private vehicle    Yes    Equipment Recommendations None recommended by PT (has necessary equipment)     Functional Status Assessment Patient has had a recent decline in their functional status and demonstrates the ability to make significant improvements in function in a reasonable and predictable amount of time.     Precautions / Restrictions Precautions Precautions: Fall Recall of Precautions/Restrictions: Intact Restrictions Weight Bearing Restrictions Per Provider Order: No      Mobility  Bed  Mobility Overal bed mobility: Modified Independent             General bed mobility comments: HOB elevated and increase in time completed    Transfers Overall transfer level: Needs assistance Equipment used: Straight cane Transfers: Sit to/from Stand, Bed to chair/wheelchair/BSC Sit to Stand: Contact guard assist   Step pivot transfers: Contact guard assist       General transfer comment: due to RLE pain limited ambulation at this time    Ambulation/Gait               General Gait Details: deferred as pt to get chest xray for SoB         Balance Overall balance assessment: Needs assistance Sitting-balance support: Feet supported Sitting balance-Leahy Scale: Good     Standing balance support: Bilateral upper extremity supported Standing balance-Leahy Scale: Poor Standing balance comment: due to RLE pain                             Pertinent Vitals/Pain Pain Assessment Pain Assessment: Faces Faces Pain Scale: Hurts even more Pain Location: RLE edema/ blisters and when WB Pain Descriptors / Indicators: Grimacing, Guarding Pain Intervention(s): Limited activity within patient's tolerance, Monitored during session, Repositioned    Home Living Family/patient expects to be discharged to:: Private residence Living Arrangements: Spouse/significant other;Other relatives Available Help at Discharge: Family;Available 24 hours/day Type of Home: House Home Access: Stairs to enter Entrance Stairs-Rails: None Entrance Stairs-Number of Steps: 1 Alternate Level Stairs-Number of Steps: flight Home Layout: Multi-level;Able to live on main level with bedroom/bathroom Home Equipment: Agricultural consultant (2  wheels);Shower seat;Rollator (4 wheels);Cane - single point;Wheelchair - manual      Prior Function Prior Level of Function : Independent/Modified Independent;Driving             Mobility Comments: only ambulates short distances in house currently ADLs  Comments: reports independence with ADLs family assists with iADLs     Extremity/Trunk Assessment   Upper Extremity Assessment Upper Extremity Assessment: Defer to OT evaluation    Lower Extremity Assessment Lower Extremity Assessment: RLE deficits/detail RLE Deficits / Details: blisters on R LE from increased fluid, painful with ambulation    Cervical / Trunk Assessment Cervical / Trunk Assessment: Kyphotic  Communication   Communication Communication: No apparent difficulties Factors Affecting Communication: Other (comment) (Non-English speaking utilized in-person interpreter)    Cognition Arousal: Alert Behavior During Therapy: WFL for tasks assessed/performed                             Following commands: Intact       Cueing Cueing Techniques: Verbal cues     General Comments General comments (skin integrity, edema, etc.): blisters on RLE        Assessment/Plan    PT Assessment Patient needs continued PT services  PT Problem List Decreased strength;Decreased range of motion;Decreased activity tolerance;Decreased balance;Decreased mobility;Decreased coordination;Decreased safety awareness;Cardiopulmonary status limiting activity;Decreased skin integrity;Pain       PT Treatment Interventions DME instruction;Gait training;Stair training;Functional mobility training;Therapeutic activities;Therapeutic exercise;Balance training;Cognitive remediation;Patient/family education    PT Goals (Current goals can be found in the Care Plan section)  Acute Rehab PT Goals PT Goal Formulation: With patient Time For Goal Achievement: 06/18/23 Potential to Achieve Goals: Fair    Frequency Min 3X/week        AM-PAC PT "6 Clicks" Mobility  Outcome Measure Help needed turning from your back to your side while in a flat bed without using bedrails?: A Little Help needed moving from lying on your back to sitting on the side of a flat bed without using bedrails?: A  Little Help needed moving to and from a bed to a chair (including a wheelchair)?: A Little Help needed standing up from a chair using your arms (e.g., wheelchair or bedside chair)?: A Little Help needed to walk in hospital room?: A Little Help needed climbing 3-5 steps with a railing? : A Little 6 Click Score: 18    End of Session   Activity Tolerance: Patient limited by pain Patient left: in chair;with chair alarm set;Other (comment) (interpreter and Palliative Care in room at end of session) Nurse Communication: Mobility status PT Visit Diagnosis: Difficulty in walking, not elsewhere classified (R26.2);Pain;Muscle weakness (generalized) (M62.81);Unsteadiness on feet (R26.81) Pain - Right/Left: Right Pain - part of body: Leg    Time: 6295-2841 PT Time Calculation (min) (ACUTE ONLY): 21 min   Charges:   PT Evaluation $PT Eval Moderate Complexity: 1 Mod   PT General Charges $$ ACUTE PT VISIT: 1 Visit         Sirenity Shew B. Jewel Mortimer PT, DPT Acute Rehabilitation Services Please use secure chat or  Call Office [phone]   Verlie Glisson [facility] 06/04/2023, 1:55 PM

## 2023-06-04 NOTE — Progress Notes (Signed)
 Rounding Note    Patient Name: Colin [name] Date of Encounter: 06/04/2023  [facility] Cardiologist: Cody Das, MD   Subjective   Pt seen by Dr. Leory Rands  Inpatient Medications    Scheduled Meds:  atorvastatin   80 mg Oral QHS   clopidogrel   75 mg Oral Daily   furosemide   80 mg Intravenous Daily   heparin   5,000 Units Subcutaneous Q8H   insulin  aspart  0-5 Units Subcutaneous QHS   insulin  aspart  0-9 Units Subcutaneous TID WC   insulin  glargine-yfgn  8 Units Subcutaneous Daily   isosorbide -hydrALAZINE   1 tablet Oral TID   levothyroxine   25 mcg Oral Q0600   sodium chloride  flush  3 mL Intravenous Q12H   Continuous Infusions:  PRN Meds: acetaminophen , hydrALAZINE , ondansetron  (ZOFRAN ) IV, sodium chloride  flush   Vital Signs    Vitals:   06/04/23 0040 06/04/23 0418 06/04/23 0745 06/04/23 0746  BP: 135/61 (!) 110/51  135/71  Pulse: 75 70    Resp: 10 16  20   Temp: 98 F (36.7 C) 98 F (36.7 C) 98 F (36.7 C)   TempSrc: Oral Oral Oral   SpO2: 100% 99%  98%  Weight:  65 kg    Height:        Intake/Output Summary (Last 24 hours) at 06/04/2023 1033 Last data filed at 06/04/2023 0800 Gross per 24 hour  Intake 1000 ml  Output 1850 ml  Net -850 ml      06/04/2023    4:18 AM 06/03/2023    4:10 AM 06/02/2023   12:43 PM  Last 3 Weights  Weight (lbs) 143 lb 4.8 oz 147 lb 11.3 oz 147 lb 0.8 oz  Weight (kg) 65 kg 67 kg 66.7 kg      Telemetry    Per Dr. Stann Earnest - Personally Reviewed  ECG     - Personally Reviewed  Physical Exam   PE per Dr. Stann Earnest  Labs    High Sensitivity Troponin:   Recent Labs  Lab 05/15/23 1254 05/15/23 1615 06/02/23 0858  TROPONINIHS 18* 19* 21*     Chemistry Recent Labs  Lab 06/02/23 0858 06/03/23 0259 06/04/23 0302  NA 139 143 139  K 4.3 4.6 4.6  CL 109 109 104  CO2 24 26 25   GLUCOSE 346* 113* 103*  BUN 42* 46* 55*  CREATININE 3.20* 3.55* 4.00*  CALCIUM  7.8* 8.0* 7.9*  MG  --   --  2.2  PROT 5.1*   --   --   ALBUMIN  <1.5*  --   --   AST 21  --   --   ALT 16  --   --   ALKPHOS 66  --   --   BILITOT 0.2  --   --   GFRNONAA 20* 17* 15*  ANIONGAP 6 8 10     Lipids No results for input(s): "CHOL", "TRIG", "HDL", "LABVLDL", "LDLCALC", "CHOLHDL" in the last 168 hours.  Hematology Recent Labs  Lab 06/02/23 0700 06/04/23 0302  WBC 7.0 6.0  RBC 3.92* 3.37*  HGB 11.6* 10.1*  HCT 37.7* 31.9*  MCV 96.2 94.7  MCH 29.6 30.0  MCHC 30.8 31.7  RDW 14.7 14.6  PLT 246 210   Thyroid   Recent Labs  Lab 06/03/23 0259  TSH 9.007*  FREET4 0.80    BNP Recent Labs  Lab 06/02/23 0700 06/04/23 0302  BNP >4,500.0* 2,845.4*    DDimer No results for input(s): "DDIMER" in the last 168 hours.  Radiology    IR THORACENTESIS ASP PLEURAL SPACE W/IMG GUIDE Result Date: 06/03/2023 INDICATION: 73 year old male with acute on chronic heart failure, presenting to ED with shortness of breath and volume overload, with bilateral pleural effusion, left greater than right. IR was requested for left-sided therapeutic thoracentesis. EXAM: ULTRASOUND GUIDED THERAPEUTIC THORACENTESIS MEDICATIONS: 6 cc of 1% lidocaine  COMPLICATIONS: None immediate. PROCEDURE: An ultrasound guided thoracentesis was thoroughly discussed with the patient and questions answered. The benefits, risks, alternatives and complications were also discussed. The patient understands and wishes to proceed with the procedure. Written consent was obtained. Ultrasound was performed to localize and mark an adequate pocket of fluid in the left chest. The area was then prepped and draped in the normal sterile fashion. 1% Lidocaine  was used for local anesthesia. Under ultrasound guidance a 6 Fr Safe-T-Centesis catheter was introduced. Thoracentesis was performed. The catheter was removed and a dressing applied. FINDINGS: A total of approximately 1.3 L of clear, nearly colorless pleural fluid was removed. IMPRESSION: Successful ultrasound guided left  thoracentesis yielding 1.3 of pleural fluid. Procedure performed by Lambert Pillion, PA-C Electronically Signed   By: Nicoletta Barrier M.D.   On: 06/03/2023 12:04   DG Chest 1 View Result Date: 06/03/2023 CLINICAL DATA:  Post thoracentesis on the left. EXAM: CHEST  1 VIEW COMPARISON:  Radiographs 06/02/2023 and 05/15/2023. FINDINGS: 1025 hours. Interval significant improvement in the previously demonstrated left pleural effusion. There are small residual pleural effusions bilaterally with mild atelectasis at both lung bases. No confluent airspace disease or pneumothorax. The heart size and mediastinal contours are stable post median sternotomy and CABG. No acute osseous findings are evident. IMPRESSION: Interval significant improvement in left pleural effusion post thoracentesis. No pneumothorax. Electronically Signed   By: Elmon Hagedorn M.D.   On: 06/03/2023 11:28    Cardiac Studies   Echo 02/2023:  1. Left ventricular ejection fraction, by estimation, is 30 to 35%. The  left ventricle has moderately decreased function. The left ventricle  demonstrates global hypokinesis. The left ventricular internal cavity size  was mildly dilated. Left ventricular  diastolic parameters are consistent with Grade III diastolic dysfunction  (restrictive). Elevated left atrial pressure.   2. Right ventricular systolic function is moderately reduced. The right  ventricular size is normal. Tricuspid regurgitation signal is inadequate  for assessing PA pressure.   3. Left atrial size was mild to moderately dilated.   4. Large pleural effusion in the left lateral region.   5. The mitral valve is normal in structure. Moderate to severe mitral  valve regurgitation. No evidence of mitral stenosis.   6. The aortic valve is tricuspid. There is mild calcification of the  aortic valve. There is mild thickening of the aortic valve. Aortic valve  regurgitation is not visualized. Aortic valve sclerosis/calcification is  present,  without any evidence of  aortic stenosis.    Patient Profile     73 y.o. male with a hx of CAD with prior MI, LCX and RCA stents in 2012, s/p CABG X 3. LIMA LAD, RSVG PDA, OM1 (08/23/2020), chronic systolic heart failure, diabetes, HTN, HLD, CKD stage 4 (baseline SCr 2-2.5), tobacco use and GERD who is being seen for the evaluation of acute on chronic heart failure   Assessment & Plan    Acute on chronic systolic and diastolic heart failure CKD stage IV - LVEF 30-35% with grade 3 diastolic dysfunction, moderately reduced RV systolic function, mild to moderate LAE -Admitted with BNP greater than 4500-now down to 2800 -  Currently diuresing on 80 mg IV Lasix  twice daily - Continue BiDil  20-37.5 mg 1 tablet 3 times daily -Down 4 pounds from admission - Diuresing well on high-dose Lasix  -Creatinine rising at 4.0 today from 3.5 yesterday, K 4.6   CAD s/p CABG x 3 Hyperlipidemia with LDL goal less than 70 -Continue statin, likely has not been taking per dispense report -Should be on Plavix , however dispense report does not imply compliance -Restart 80 mg Lipitor , 75 mg Plavix    Hypertension Hypertensive urgency - Presented with BP 199/115 - BP better controlled on above medications   COPD DM GERD, anemia Goals of care Failure to thrive - Concern for failure to thrive 1 last seen by AHF in the outpatient setting - He is noncompliant with medications per dispense report - Patient's son states that he does not drive and is sedentary during the day - Felt a poor prognostic state with multiple recent admissions - Agree with goals of care discussion      For questions or updates, please contact [facility] Please consult [email or web address] for contact info under        Signed, Lamond Pilot, PA  06/04/2023, 10:33 AM

## 2023-06-05 DIAGNOSIS — I2510 Atherosclerotic heart disease of native coronary artery without angina pectoris: Secondary | ICD-10-CM | POA: Diagnosis not present

## 2023-06-05 DIAGNOSIS — Z66 Do not resuscitate: Secondary | ICD-10-CM

## 2023-06-05 DIAGNOSIS — R627 Adult failure to thrive: Secondary | ICD-10-CM

## 2023-06-05 DIAGNOSIS — R7989 Other specified abnormal findings of blood chemistry: Secondary | ICD-10-CM | POA: Diagnosis not present

## 2023-06-05 DIAGNOSIS — J90 Pleural effusion, not elsewhere classified: Secondary | ICD-10-CM | POA: Diagnosis not present

## 2023-06-05 DIAGNOSIS — I160 Hypertensive urgency: Secondary | ICD-10-CM | POA: Diagnosis not present

## 2023-06-05 DIAGNOSIS — I509 Heart failure, unspecified: Secondary | ICD-10-CM | POA: Diagnosis not present

## 2023-06-05 DIAGNOSIS — I5023 Acute on chronic systolic (congestive) heart failure: Secondary | ICD-10-CM | POA: Diagnosis not present

## 2023-06-05 DIAGNOSIS — N184 Chronic kidney disease, stage 4 (severe): Secondary | ICD-10-CM | POA: Diagnosis not present

## 2023-06-05 LAB — BASIC METABOLIC PANEL WITH GFR
Anion gap: 7 (ref 5–15)
BUN: 59 mg/dL — ABNORMAL HIGH (ref 8–23)
CO2: 28 mmol/L (ref 22–32)
Calcium: 7.9 mg/dL — ABNORMAL LOW (ref 8.9–10.3)
Chloride: 105 mmol/L (ref 98–111)
Creatinine, Ser: 4 mg/dL — ABNORMAL HIGH (ref 0.61–1.24)
GFR, Estimated: 15 mL/min — ABNORMAL LOW (ref >60)
Glucose, Bld: 104 mg/dL — ABNORMAL HIGH (ref 70–99)
Potassium: 4.2 mmol/L (ref 3.5–5.1)
Sodium: 140 mmol/L (ref 135–145)

## 2023-06-05 LAB — GLUCOSE, CAPILLARY
Glucose-Capillary: 89 mg/dL (ref 70–99)
Glucose-Capillary: 186 mg/dL — ABNORMAL HIGH (ref 70–99)
Glucose-Capillary: 197 mg/dL — ABNORMAL HIGH (ref 70–99)
Glucose-Capillary: 203 mg/dL — ABNORMAL HIGH (ref 70–99)

## 2023-06-05 LAB — HEMOGLOBIN A1C
Hgb A1c MFr Bld: 12.8 % — ABNORMAL HIGH (ref 4.8–5.6)
Mean Plasma Glucose: 320.66 mg/dL

## 2023-06-05 LAB — CBC
HCT: 32.9 % — ABNORMAL LOW (ref 39.0–52.0)
Hemoglobin: 10.3 g/dL — ABNORMAL LOW (ref 13.0–17.0)
MCH: 29.6 pg (ref 26.0–34.0)
MCHC: 31.3 g/dL (ref 30.0–36.0)
MCV: 94.5 fL (ref 80.0–100.0)
Platelets: 224 10*3/uL (ref 150–400)
RBC: 3.48 MIL/uL — ABNORMAL LOW (ref 4.22–5.81)
RDW: 14.4 % (ref 11.5–15.5)
WBC: 5.2 10*3/uL (ref 4.0–10.5)
nRBC: 0 % (ref 0.0–0.2)

## 2023-06-05 LAB — MAGNESIUM: Magnesium: 2.1 mg/dL (ref 1.7–2.4)

## 2023-06-05 MED ORDER — CARVEDILOL 6.25 MG PO TABS
6.2500 mg | ORAL_TABLET | Freq: Two times a day (BID) | ORAL | Status: DC
  Administered 2023-06-05 – 2023-06-06 (×2): 6.25 mg via ORAL
  Filled 2023-06-05 (×2): qty 1

## 2023-06-05 NOTE — Progress Notes (Signed)
 PROGRESS NOTE  Colin [name] UJW:[id number] DOB: 10-24-1950   PCP: Marius Siemens, NP  Patient is from: Home.  Lives with family.  Sedentary for most part.  DOA: 06/02/2023 LOS: 3  Chief complaints Chief Complaint  Patient presents with   Shortness of Breath   Hypertension     Brief Narrative / Interim history: 73 year old M with PMH of CAD/CABG x 3, combined CHF, DM-2, CKD-4, HTN and HLD presenting with progressive shortness of breath, edema, orthopnea and lower extremity blisters, and admitted with working diagnosis of acute on chronic CHF with large left pleural effusion and hypertensive urgency likely due to noncompliance with medications.  In ED, elevated BP to 119/115.  BNP > 4500.  Troponin 21. Cr 3.2.  Albumin  <1.5.  CXR showed moderate to large left pleural effusion with mild pulmonary edema.  Patient was started on IV ceftriaxone .  IR consulted for thoracocentesis.  The next day, underwent right thoracocentesis with removal of 1.3 L. Remains on IV Lasix .  Cr plateaued at 4.0.  Cardiology and palliative following.  Subjective: Seen and examined earlier this morning.  Patient's son, Hiu helped as an interpreter.  Patient's wife at bedside as well.  Patient and family meeting with palliative to discuss goals of care.  Patient reports improvement in his breathing.  He has now TED hose.  Discussed about guarded prognosis with his advanced heart and kidney failure.  Also discussed about cardiologist recommendation. Hiu is in agreement with DNR and home hospice but he does not live with patient.   Objective: Vitals:   06/05/23 0007 06/05/23 0420 06/05/23 0818 06/05/23 1153  BP: (!) 156/70 (!) 141/66 (!) 141/62 (!) 161/71  Pulse: 75 72 75   Resp: (!) 21 17 14  (!) 25  Temp: (P) 98.2 F (36.8 C) (!) 97.5 F (36.4 C) 98 F (36.7 C) 98 F (36.7 C)  TempSrc: (P) Oral Oral Oral Oral  SpO2: 100% 97% 93% 100%  Weight: (P) 66.8 kg     Height:        Examination:  GENERAL:  Appears frail and chronically ill. HEENT: MMM.  Vision and hearing grossly intact.  NECK: Supple.  No apparent JVD.  RESP:  No IWOB.  Fair aeration bilaterally.  Bibasilar rales. CVS:  RRR. Heart sounds normal.  ABD/GI/GU: BS+. Abd soft, NTND.  MSK/EXT:  Moves extremities.  BLE edema.  TED hose in place. SKIN: no apparent skin lesion or wound NEURO: Awake, alert and oriented appropriately.  No apparent focal neuro deficit. PSYCH: Calm. Normal affect.   Consultants:  Cardiology Interventional radiology Palliative medicine  Procedures: 4/28-right thoracocentesis with removal of 1.3 L  Microbiology summarized: None  Assessment and plan: Acute on chronic combined CHF: Presents with progressive dyspnea, orthopnea and edema with lower extremity blisters.  Likely due to noncompliance.  TTE 02/2023 with LVEF of 30 to 35%, GH, G2 DD, mod reduced RVF, moderate to severe MR. BNP > 4500.  CXR with large left pleural effusion and pulmonary edema.  Started on IV Lasix .  Net -2.5 L.  Cr plateaued at 4.0.  Reports improvement in his breathing.  Overall prognosis poor. -Cardiology-continue IV Lasix  today and transition to p.o. in the morning -Strict intake and output, daily weight, renal functions and electrolytes -Palliative meeting with patient and family at bedside. -Likely home with home hospice on 5/1  Elevated troponin/history of CAD/CABG: No chest pain.  Troponin elevation mild and likely demand ischemia from CHF and delayed clearance from AKI -BiDil  and  Lasix  as above -Continue Plavix  and Lipitor   Uncontrolled IDDM-2 with hyperglycemia: A1c 11.6% in 02/2023 Recent Labs  Lab 06/04/23 1124 06/04/23 1553 06/04/23 2112 06/05/23 0614 06/05/23 1037  GLUCAP 131* 227* 256* 89 186*  - Repeat hemoglobin A1c - Continue Semglee  8 units daily - Continue SSI-sensitive  Moderate to large left pleural effusion: Prior history of this. -S/p thoracocentesis with removal of 1.3 L. -Diuretics as  above.  AKI on CKD-4: Baseline Cr ranges from 2.5-3.1. Cr plateaued at 4.0. Recent Labs    02/26/23 1725 02/27/23 0233 02/28/23 0240 03/01/23 0243 03/02/23 0834 05/15/23 1037 06/02/23 0858 06/03/23 0259 06/04/23 0302 06/05/23 0257  BUN 26* 25* 22 28* 30* 23 42* 46* 55* 59*  CREATININE 2.78* 2.80* 2.86* 3.15* 3.15* 2.56* 3.20* 3.55* 4.00* 4.00*  -Recheck renal function in the morning.  Hypertensive urgency: Improved -BiDil  and Lasix  as above -Resume home Coreg  at 6.25 mg twice daily  Hypothyroidism: TSH elevated but with normal free T4.  Suspect noncompliance - Continue home Synthroid  - Recheck TFT in 3 to 4 weeks  Anemia of chronic disease: Stable -Continue monitoring  Physical deconditioning -PT/OT eval  Goal of care discussion: Palliative meeting with patient and family about goal of care - Will follow update from palliative.  Body mass index is 26.09 kg/m (pended).          DVT prophylaxis:  Place TED hose Start: 06/04/23 1421 heparin  injection 5,000 Units Start: 06/02/23 1400  Code Status: Full code Family Communication: Updated patient's wife and son, Hiu at bedside. Level of care: Telemetry Cardiac Status is: Inpatient Remains inpatient appropriate because: Acute CHF and AKI   Final disposition: Likely home with home hospice.   55 minutes with more than 50% spent in reviewing records, counseling patient/family and coordinating care.   Sch Meds:  Scheduled Meds:  atorvastatin   80 mg Oral QHS   clopidogrel   75 mg Oral Daily   furosemide   80 mg Intravenous Daily   heparin   5,000 Units Subcutaneous Q8H   insulin  aspart  0-5 Units Subcutaneous QHS   insulin  aspart  0-9 Units Subcutaneous TID WC   insulin  glargine-yfgn  8 Units Subcutaneous Daily   isosorbide -hydrALAZINE   1 tablet Oral TID   levothyroxine   25 mcg Oral Q0600   sodium chloride  flush  3 mL Intravenous Q12H   Continuous Infusions: PRN Meds:.acetaminophen , hydrALAZINE , ondansetron   (ZOFRAN ) IV, sodium chloride  flush  Antimicrobials: Anti-infectives (From admission, onward)    None        I have personally reviewed the following labs and images: CBC: Recent Labs  Lab 06/02/23 0700 06/04/23 0302 06/05/23 0257  WBC 7.0 6.0 5.2  HGB 11.6* 10.1* 10.3*  HCT 37.7* 31.9* 32.9*  MCV 96.2 94.7 94.5  PLT 246 210 224   BMP &GFR Recent Labs  Lab 06/02/23 0858 06/03/23 0259 06/04/23 0302 06/05/23 0257  NA 139 143 139 140  K 4.3 4.6 4.6 4.2  CL 109 109 104 105  CO2 24 26 25 28   GLUCOSE 346* 113* 103* 104*  BUN 42* 46* 55* 59*  CREATININE 3.20* 3.55* 4.00* 4.00*  CALCIUM  7.8* 8.0* 7.9* 7.9*  MG  --   --  2.2 2.1   Estimated Creatinine Clearance: 13.2 mL/min (A) (by C-G formula based on SCr of 4 mg/dL (H)). Liver & Pancreas: Recent Labs  Lab 06/02/23 0858  AST 21  ALT 16  ALKPHOS 66  BILITOT 0.2  PROT 5.1*  ALBUMIN  <1.5*   No results for input(s): "  LIPASE", "AMYLASE" in the last 168 hours. No results for input(s): "AMMONIA" in the last 168 hours. Diabetic: No results for input(s): "HGBA1C" in the last 72 hours. Recent Labs  Lab 06/04/23 1124 06/04/23 1553 06/04/23 2112 06/05/23 0614 06/05/23 1037  GLUCAP 131* 227* 256* 89 186*   Cardiac Enzymes: No results for input(s): "CKTOTAL", "CKMB", "CKMBINDEX", "TROPONINI" in the last 168 hours. No results for input(s): "PROBNP" in the last 8760 hours. Coagulation Profile: No results for input(s): "INR", "PROTIME" in the last 168 hours. Thyroid  Function Tests: Recent Labs    06/03/23 0259  TSH 9.007*  FREET4 0.80   Lipid Profile: No results for input(s): "CHOL", "HDL", "LDLCALC", "TRIG", "CHOLHDL", "LDLDIRECT" in the last 72 hours. Anemia Panel: No results for input(s): "VITAMINB12", "FOLATE", "FERRITIN", "TIBC", "IRON", "RETICCTPCT" in the last 72 hours. Urine analysis:    Component Value Date/Time   COLORURINE YELLOW 06/02/2023 0858   APPEARANCEUR CLEAR 06/02/2023 0858   LABSPEC  1.025 06/02/2023 0858   PHURINE 7.0 06/02/2023 0858   GLUCOSEU >=500 (A) 06/02/2023 0858   HGBUR NEGATIVE 06/02/2023 0858   BILIRUBINUR NEGATIVE 06/02/2023 0858   BILIRUBINUR negative 05/06/2020 1113   KETONESUR NEGATIVE 06/02/2023 0858   PROTEINUR >=300 (A) 06/02/2023 0858   UROBILINOGEN 0.2 05/06/2020 1113   UROBILINOGEN 0.2 01/21/2010 1330   NITRITE NEGATIVE 06/02/2023 0858   LEUKOCYTESUR NEGATIVE 06/02/2023 0858   Sepsis Labs: Invalid input(s): "PROCALCITONIN", "LACTICIDVEN"  Microbiology: No results found for this or any previous visit (from the past 240 hours).  Radiology Studies: DG Chest Port 1 View Result Date: 06/04/2023 CLINICAL DATA:  Shortness of breath. EXAM: PORTABLE CHEST 1 VIEW COMPARISON:  Chest radiograph dated 06/03/2023. FINDINGS: Bilateral pleural effusions, left greater than right. There is associated bibasilar compressive atelectasis versus pneumonia. No pneumothorax. Stable cardiac silhouette. Median sternotomy wires. No acute osseous pathology. IMPRESSION: Bilateral pleural effusions with bibasilar atelectasis or infiltrate. Electronically Signed   By: Angus Bark M.D.   On: 06/04/2023 14:50       Calub Tarnow T. Masyn Rostro [facility]  If 7PM-7AM, please contact night-coverage [email or web address] 06/05/2023, 12:36 PM

## 2023-06-05 NOTE — Progress Notes (Signed)
 Patient ID: Colin [name], male   DOB: 03-12-1950, 73 y.o.   MRN: [id number]    Progress Note from the Palliative Medicine Team at [facility]   Patient Name: Colin [name]        Date: 06/05/2023 DOB: March 29, 1950  Age: 73 y.o. MRN#: [id number] Attending Physician: Theadore Finger, MD Primary Care Physician: Marius Siemens, NP Admit Date: 06/02/2023   Reason for Consultation/Follow-up   Establishing Goals of Care   HPI/ Brief Hospital Review  73 y.o. male   admitted on 06/02/2023 with PMH of CAD/CABG x 3, combined CHF, DM-2, CKD-4, HTN and HLD presenting with progressive shortness of breath, edema, orthopnea and lower extremity blisters, and admitted with working diagnosis of acute on chronic CHF with large left pleural effusion and hypertensive urgency likely due to noncompliance with medications.   In ED, elevated BP to 119/115.  BNP > 4500.  Troponin 21. Cr 3.2.  Albumin  <1.5.  CXR showed moderate to large left pleural effusion with mild pulmonary edema.  Patient was started on IV ceftriaxone .  IR consulted for thoracocentesis.   Right thoracocentesis with removal of 1.3 L.  Cardiology and palliative consulted.  Multiple hospitalizations in the past month.   Family face treatment option decision advanced directive decisions and anticipatory care needs.   Subjective  Extensive chart review has been completed prior to meeting with patient/family  including labs, vital signs, imaging, progress/consult notes, orders, medications and available advance directive documents.    This NP assessed patient at the bedside as a follow up to  yesterday's GOCs meeting. Son  Oretha Birch and wife/ Hnhip at bedside.  They declined utilization of interpreter.  Son reports he is bilingual and will translate for his family. Son agrees that patient does have cognitive deficit, patient has limited understanding of his medical condition.  Education offered on patient's multiple comorbidities specific to CHF,  CKD stage IV, pleural effusion, uncontrolled diabetes, cognitive decline and continued physical and functional decline.  Education offered on the concept of adult failure to thrive and the limitations of medical interventions to prolong quality of life when the body does fail to thrive.  Education offered on the difference between a full medical support path versus a palliative comfort path allowing for natural death.  Education offered on hospice benefit; philosophy and eligibility         Later in the morning I was able to speak to Allison/granddaughter regarding all the above concepts.  They have spoken together as a family..  I was then able to speak to [facility] by telephone.         As a family,  decision is made for DNR/ DNI and transition patient home with hospice.    Plan of Care:  - DNR / DNI - Hopeful for hospice at home, placed referral - Continue medical management of chronic disease at home under hospice services, as long as patient is tolerating - No dialysis - Avoid rehospitalization - Continue goals of care meeting at home with hospice support.  Educated family on long-term poor prognosis, likely less than 6 months.  Education offered on the natural trajectory and expectations at end-of-life.  Education offered today regarding  the importance of continued conversation with family and their  medical providers regarding overall plan of care and treatment options,  ensuring decisions are within the context of the patients values and GOCs.  Questions and concerns addressed   Discussed with primary team and nursing staff  Discussed with  Dr. Mae Schlossman   Time: 65  minutes  Detailed review of medical records ( labs, imaging, vital signs), medically appropriate exam ( MS, skin, cardiac,  resp)   discussed with treatment team, counseling and education to patient, family, staff, documenting clinical information, medication management, coordination of care    Thena Fireman NP  Palliative  Medicine Team Team Phone # [phone] Pager [phone]

## 2023-06-05 NOTE — Progress Notes (Signed)
 Mobility Specialist Progress Note:    06/05/23 1203  Mobility  Activity Ambulated with assistance in room;Stood at bedside  Level of Assistance Minimal assist, patient does 75% or more  Assistive Device Other (Comment) (HHA)  Distance Ambulated (ft) 3 ft  Activity Response Tolerated well  Mobility Referral Yes  Mobility visit 1 Mobility  Mobility Specialist Start Time (ACUTE ONLY) 0945  Mobility Specialist Stop Time (ACUTE ONLY) 0955  Mobility Specialist Time Calculation (min) (ACUTE ONLY) 10 min   Pt received sitting EOB with BR alarm going off. Pt requested assistance getting situated in bed. Pt required MinA HHA to stand. NT assisted w/ pericare. Pt was able to take a couple side steps by the bed. Returned to supine in bed w/ MinA. No c/o throughout. Call bell and personal belongings in reach. All needs met. Bed alarm on and family in room.  Inetta Manes Mobility Specialist  Please contact vis Secure Chat or  Rehab Office [phone]

## 2023-06-05 NOTE — Progress Notes (Signed)
 Rounding Note    Patient Name: Colin [name] Date of Encounter: 06/05/2023   [facility] Cardiologist: Cody Das, MD   Subjective   Pt seen after family meeting with palliative. Unfortunately only the son that doesn't live with him and wife were present for the meeting. The pt reports improved breathing.  Inpatient Medications    Scheduled Meds:  atorvastatin   80 mg Oral QHS   clopidogrel   75 mg Oral Daily   furosemide   80 mg Intravenous Daily   heparin   5,000 Units Subcutaneous Q8H   insulin  aspart  0-5 Units Subcutaneous QHS   insulin  aspart  0-9 Units Subcutaneous TID WC   insulin  glargine-yfgn  8 Units Subcutaneous Daily   isosorbide -hydrALAZINE   1 tablet Oral TID   levothyroxine   25 mcg Oral Q0600   sodium chloride  flush  3 mL Intravenous Q12H   Continuous Infusions:  PRN Meds: acetaminophen , hydrALAZINE , ondansetron  (ZOFRAN ) IV, sodium chloride  flush   Vital Signs    Vitals:   06/04/23 1941 06/05/23 0007 06/05/23 0420 06/05/23 0818  BP: (!) 156/69 (!) 156/70 (!) 141/66 (!) 141/62  Pulse: 71 75 72 75  Resp: 20 (!) 21 17 14   Temp: (!) 97.5 F (36.4 C) (P) 98.2 F (36.8 C) (!) 97.5 F (36.4 C) 98 F (36.7 C)  TempSrc: Oral (P) Oral Oral Oral  SpO2: 100% 100% 97% 93%  Weight:  (P) 66.8 kg    Height:        Intake/Output Summary (Last 24 hours) at 06/05/2023 1001 Last data filed at 06/05/2023 0430 Gross per 24 hour  Intake 420 ml  Output 1430 ml  Net -1010 ml      06/05/2023   12:07 AM 06/04/2023    4:18 AM 06/03/2023    4:10 AM  Last 3 Weights  Weight (lbs) 147 lb 4.3 oz 143 lb 4.8 oz 147 lb 11.3 oz  Weight (kg) 66.8 kg 65 kg 67 kg      Telemetry    Sinus rhythm with PVCs, HR 80s - Personally Reviewed  ECG    No new tracings - Personally Reviewed  Physical Exam   GEN: No acute distress.   Neck: No JVD Cardiac: RRR, no murmurs, rubs, or gallops.  Respiratory: diminished in bases GI: Soft, nontender, non-distended  MS:  B LE L > R, compression stocking are bothering him Neuro:  Nonfocal  Psych: Normal affect   Labs    High Sensitivity Troponin:   Recent Labs  Lab 05/15/23 1254 05/15/23 1615 06/02/23 0858  TROPONINIHS 18* 19* 21*     Chemistry Recent Labs  Lab 06/02/23 0858 06/03/23 0259 06/04/23 0302 06/05/23 0257  NA 139 143 139 140  K 4.3 4.6 4.6 4.2  CL 109 109 104 105  CO2 24 26 25 28   GLUCOSE 346* 113* 103* 104*  BUN 42* 46* 55* 59*  CREATININE 3.20* 3.55* 4.00* 4.00*  CALCIUM  7.8* 8.0* 7.9* 7.9*  MG  --   --  2.2 2.1  PROT 5.1*  --   --   --   ALBUMIN  <1.5*  --   --   --   AST 21  --   --   --   ALT 16  --   --   --   ALKPHOS 66  --   --   --   BILITOT 0.2  --   --   --   GFRNONAA 20* 17* 15* 15*  ANIONGAP 6 8 10  7    Lipids No results for input(s): "CHOL", "TRIG", "HDL", "LABVLDL", "LDLCALC", "CHOLHDL" in the last 168 hours.  Hematology Recent Labs  Lab 06/02/23 0700 06/04/23 0302 06/05/23 0257  WBC 7.0 6.0 5.2  RBC 3.92* 3.37* 3.48*  HGB 11.6* 10.1* 10.3*  HCT 37.7* 31.9* 32.9*  MCV 96.2 94.7 94.5  MCH 29.6 30.0 29.6  MCHC 30.8 31.7 31.3  RDW 14.7 14.6 14.4  PLT 246 210 224   Thyroid   Recent Labs  Lab 06/03/23 0259  TSH 9.007*  FREET4 0.80    BNP Recent Labs  Lab 06/02/23 0700 06/04/23 0302  BNP >4,500.0* 2,845.4*    DDimer No results for input(s): "DDIMER" in the last 168 hours.   Radiology    DG Chest Port 1 View Result Date: 06/04/2023 CLINICAL DATA:  Shortness of breath. EXAM: PORTABLE CHEST 1 VIEW COMPARISON:  Chest radiograph dated 06/03/2023. FINDINGS: Bilateral pleural effusions, left greater than right. There is associated bibasilar compressive atelectasis versus pneumonia. No pneumothorax. Stable cardiac silhouette. Median sternotomy wires. No acute osseous pathology. IMPRESSION: Bilateral pleural effusions with bibasilar atelectasis or infiltrate. Electronically Signed   By: Angus Bark M.D.   On: 06/04/2023 14:50   IR THORACENTESIS  ASP PLEURAL SPACE W/IMG GUIDE Result Date: 06/03/2023 INDICATION: 74 year old male with acute on chronic heart failure, presenting to ED with shortness of breath and volume overload, with bilateral pleural effusion, left greater than right. IR was requested for left-sided therapeutic thoracentesis. EXAM: ULTRASOUND GUIDED THERAPEUTIC THORACENTESIS MEDICATIONS: 6 cc of 1% lidocaine  COMPLICATIONS: None immediate. PROCEDURE: An ultrasound guided thoracentesis was thoroughly discussed with the patient and questions answered. The benefits, risks, alternatives and complications were also discussed. The patient understands and wishes to proceed with the procedure. Written consent was obtained. Ultrasound was performed to localize and mark an adequate pocket of fluid in the left chest. The area was then prepped and draped in the normal sterile fashion. 1% Lidocaine  was used for local anesthesia. Under ultrasound guidance a 6 Fr Safe-T-Centesis catheter was introduced. Thoracentesis was performed. The catheter was removed and a dressing applied. FINDINGS: A total of approximately 1.3 L of clear, nearly colorless pleural fluid was removed. IMPRESSION: Successful ultrasound guided left thoracentesis yielding 1.3 of pleural fluid. Procedure performed by Lambert Pillion, PA-C Electronically Signed   By: Nicoletta Barrier M.D.   On: 06/03/2023 12:04   DG Chest 1 View Result Date: 06/03/2023 CLINICAL DATA:  Post thoracentesis on the left. EXAM: CHEST  1 VIEW COMPARISON:  Radiographs 06/02/2023 and 05/15/2023. FINDINGS: 1025 hours. Interval significant improvement in the previously demonstrated left pleural effusion. There are small residual pleural effusions bilaterally with mild atelectasis at both lung bases. No confluent airspace disease or pneumothorax. The heart size and mediastinal contours are stable post median sternotomy and CABG. No acute osseous findings are evident. IMPRESSION: Interval significant improvement in left  pleural effusion post thoracentesis. No pneumothorax. Electronically Signed   By: Elmon Hagedorn M.D.   On: 06/03/2023 11:28    Cardiac Studies   Echo 02/17/23: 1. Left ventricular ejection fraction, by estimation, is 30 to 35%. The  left ventricle has moderately decreased function. The left ventricle  demonstrates global hypokinesis. The left ventricular internal cavity size  was mildly dilated. Left ventricular  diastolic parameters are consistent with Grade III diastolic dysfunction  (restrictive). Elevated left atrial pressure.   2. Right ventricular systolic function is moderately reduced. The right  ventricular size is normal. Tricuspid regurgitation signal is inadequate  for assessing  PA pressure.   3. Left atrial size was mild to moderately dilated.   4. Large pleural effusion in the left lateral region.   5. The mitral valve is normal in structure. Moderate to severe mitral  valve regurgitation. No evidence of mitral stenosis.   6. The aortic valve is tricuspid. There is mild calcification of the  aortic valve. There is mild thickening of the aortic valve. Aortic valve  regurgitation is not visualized. Aortic valve sclerosis/calcification is  present, without any evidence of  aortic stenosis.   Patient Profile     73 y.o. male with a hx of CAD with prior MI, LCX and RCA stents in 2012, s/p CABG X 3. LIMA LAD, RSVG PDA, OM1 (08/23/2020), chronic systolic heart failure, diabetes, HTN, HLD, CKD stage 4 (baseline SCr 2-2.5), tobacco use and GERD who is being seen for the evaluation of acute on chronic heart failure   Assessment & Plan    Acute on chronic systolic and diastolic heart failure CKD stage IV - LVEF 30-35% with grade 3 DD, moderately reduced RV, moderate to severe MR - admitted with CHF exacerbation - 80 mg IV Lasix  BID reduced to daily given renal function - still with edema, although pt reports feeling improved - creatinine stable at 4.0 today - baseline  2.8-3.1 - continue 80 mg IV lasix  daily - continue this today, likely transition to PO lasix  - continue bidil  20-37.5 TID   CAD s/p CABG x 3 Hyperlipidemia with LDL goal < 70 - per dispense reports, medication compliance questionable - continue plavix , lipitor  80 mg   Hypertension Hypertensive urgency - presented with BP 199/115 - BP better on bidil    COPD DM GERD Anemia FTT - ongoing discussion with PMT - noncompliance and unclear support at home - poor prognostic state with multiple recent admissions        For questions or updates, please contact [facility] Please consult [email or web address] for contact info under        Signed, Lamond Pilot, PA  06/05/2023, 10:01 AM

## 2023-06-05 NOTE — Plan of Care (Signed)
  Problem: Fluid Volume: Goal: Ability to maintain a balanced intake and output will improve Outcome: Progressing   Problem: Skin Integrity: Goal: Risk for impaired skin integrity will decrease Outcome: Progressing   Problem: Tissue Perfusion: Goal: Adequacy of tissue perfusion will improve Outcome: Progressing   Problem: Clinical Measurements: Goal: Will remain free from infection Outcome: Progressing Goal: Respiratory complications will improve Outcome: Progressing Goal: Cardiovascular complication will be avoided Outcome: Progressing   Problem: Activity: Goal: Risk for activity intolerance will decrease Outcome: Progressing   Problem: Elimination: Goal: Will not experience complications related to bowel motility Outcome: Progressing Goal: Will not experience complications related to urinary retention Outcome: Progressing   Problem: Safety: Goal: Ability to remain free from injury will improve Outcome: Progressing

## 2023-06-06 ENCOUNTER — Other Ambulatory Visit (HOSPITAL_COMMUNITY): Payer: Self-pay

## 2023-06-06 DIAGNOSIS — I2510 Atherosclerotic heart disease of native coronary artery without angina pectoris: Secondary | ICD-10-CM | POA: Diagnosis not present

## 2023-06-06 DIAGNOSIS — R0603 Acute respiratory distress: Secondary | ICD-10-CM | POA: Diagnosis not present

## 2023-06-06 DIAGNOSIS — I160 Hypertensive urgency: Secondary | ICD-10-CM | POA: Diagnosis not present

## 2023-06-06 DIAGNOSIS — I5023 Acute on chronic systolic (congestive) heart failure: Secondary | ICD-10-CM | POA: Diagnosis not present

## 2023-06-06 DIAGNOSIS — R7989 Other specified abnormal findings of blood chemistry: Secondary | ICD-10-CM | POA: Diagnosis not present

## 2023-06-06 DIAGNOSIS — Z66 Do not resuscitate: Secondary | ICD-10-CM | POA: Insufficient documentation

## 2023-06-06 LAB — MAGNESIUM: Magnesium: 2.2 mg/dL (ref 1.7–2.4)

## 2023-06-06 LAB — RENAL FUNCTION PANEL
Albumin: <1.5 g/dL — ABNORMAL LOW (ref 3.5–5.0)
Anion gap: 8 (ref 5–15)
BUN: 60 mg/dL — ABNORMAL HIGH (ref 8–23)
CO2: 27 mmol/L (ref 22–32)
Calcium: 8.1 mg/dL — ABNORMAL LOW (ref 8.9–10.3)
Chloride: 106 mmol/L (ref 98–111)
Creatinine, Ser: 4.16 mg/dL — ABNORMAL HIGH (ref 0.61–1.24)
GFR, Estimated: 14 mL/min — ABNORMAL LOW (ref >60)
Glucose, Bld: 73 mg/dL (ref 70–99)
Phosphorus: 5.1 mg/dL — ABNORMAL HIGH (ref 2.5–4.6)
Potassium: 4.4 mmol/L (ref 3.5–5.1)
Sodium: 141 mmol/L (ref 135–145)

## 2023-06-06 LAB — GLUCOSE, CAPILLARY
Glucose-Capillary: 65 mg/dL — ABNORMAL LOW (ref 70–99)
Glucose-Capillary: 94 mg/dL (ref 70–99)
Glucose-Capillary: 191 mg/dL — ABNORMAL HIGH (ref 70–99)

## 2023-06-06 LAB — CBC
HCT: 31.1 % — ABNORMAL LOW (ref 39.0–52.0)
Hemoglobin: 9.7 g/dL — ABNORMAL LOW (ref 13.0–17.0)
MCH: 29.7 pg (ref 26.0–34.0)
MCHC: 31.2 g/dL (ref 30.0–36.0)
MCV: 95.1 fL (ref 80.0–100.0)
Platelets: 219 10*3/uL (ref 150–400)
RBC: 3.27 MIL/uL — ABNORMAL LOW (ref 4.22–5.81)
RDW: 14.5 % (ref 11.5–15.5)
WBC: 5.2 10*3/uL (ref 4.0–10.5)
nRBC: 0 % (ref 0.0–0.2)

## 2023-06-06 MED ORDER — ISOSORB DINITRATE-HYDRALAZINE 20-37.5 MG PO TABS
1.0000 | ORAL_TABLET | Freq: Three times a day (TID) | ORAL | 0 refills | Status: AC
  Filled 2023-06-06 – 2023-06-07 (×2): qty 90, 30d supply, fill #0

## 2023-06-06 MED ORDER — CARVEDILOL 6.25 MG PO TABS
6.2500 mg | ORAL_TABLET | Freq: Two times a day (BID) | ORAL | 0 refills | Status: AC
  Filled 2023-06-06 – 2023-06-07 (×2): qty 60, 30d supply, fill #0

## 2023-06-06 MED ORDER — LEVOTHYROXINE SODIUM 25 MCG PO TABS
25.0000 ug | ORAL_TABLET | Freq: Every day | ORAL | 0 refills | Status: AC
  Filled 2023-06-06 – 2023-06-07 (×2): qty 30, 30d supply, fill #0

## 2023-06-06 MED ORDER — INSULIN GLARGINE 100 UNIT/ML SOLOSTAR PEN
10.0000 [IU] | PEN_INJECTOR | Freq: Every day | SUBCUTANEOUS | 0 refills | Status: AC
  Filled 2023-06-06: qty 15, 30d supply, fill #0
  Filled 2023-06-07: qty 3, 30d supply, fill #0

## 2023-06-06 MED ORDER — ACETAMINOPHEN 500 MG PO TABS: 1000.0000 mg | ORAL_TABLET | Freq: Three times a day (TID) | ORAL | Status: AC | PRN

## 2023-06-06 MED ORDER — CLOPIDOGREL BISULFATE 75 MG PO TABS
75.0000 mg | ORAL_TABLET | Freq: Every day | ORAL | 0 refills | Status: AC
  Filled 2023-06-06 – 2023-06-07 (×2): qty 30, 30d supply, fill #0

## 2023-06-06 MED ORDER — FUROSEMIDE 80 MG PO TABS
80.0000 mg | ORAL_TABLET | Freq: Every day | ORAL | 0 refills | Status: AC
  Filled 2023-06-06 – 2023-06-07 (×2): qty 30, 30d supply, fill #0

## 2023-06-06 MED ORDER — FUROSEMIDE 40 MG PO TABS
80.0000 mg | ORAL_TABLET | Freq: Every day | ORAL | Status: DC
  Administered 2023-06-06: 80 mg via ORAL
  Filled 2023-06-06: qty 2

## 2023-06-06 NOTE — Plan of Care (Signed)
  Problem: Coping: Goal: Ability to adjust to condition or change in health will improve Outcome: Progressing   Problem: Health Behavior/Discharge Planning: Goal: Ability to identify and utilize available resources and services will improve Outcome: Progressing   Problem: Skin Integrity: Goal: Risk for impaired skin integrity will decrease Outcome: Progressing   Problem: Tissue Perfusion: Goal: Adequacy of tissue perfusion will improve Outcome: Progressing   Problem: Clinical Measurements: Goal: Will remain free from infection Outcome: Progressing Goal: Cardiovascular complication will be avoided Outcome: Progressing   Problem: Safety: Goal: Ability to remain free from injury will improve Outcome: Progressing

## 2023-06-06 NOTE — TOC Transition Note (Signed)
 Transition of Care [facility]) - Discharge Note   Patient Details  Name: Colin [name] MRN: [id number] Date of Birth: Nov 03, 1950  [facility] CM/SW Contact:  Jennett Model, RN Phone Number: 06/06/2023, 1:04 PM   Clinical Narrative:    For dc today, Son will transport patient home. NCM notified Randel Buss with [facility] that patient is going home with home hospice.         Patient Goals and CMS Choice            Discharge Placement                       Discharge Plan and Services Additional resources added to the After Visit Summary for                                       Social Drivers of Health (SDOH) Interventions SDOH Screenings   Food Insecurity: No Food Insecurity (06/03/2023)  Housing: Low Risk  (06/03/2023)  Transportation Needs: No Transportation Needs (06/03/2023)  Utilities: Not At Risk (06/03/2023)  Depression (PHQ2-9): High Risk (01/11/2022)  Social Connections: Moderately Integrated (06/03/2023)  Tobacco Use: Medium Risk (06/02/2023)     Readmission Risk Interventions    06/03/2023    4:32 PM 01/01/2023   12:57 PM 12/07/2022    4:35 PM  Readmission Risk Prevention Plan  Transportation Screening Complete Complete Complete  PCP or Specialist Appt within 5-7 Days   Complete  PCP or Specialist Appt within 3-5 Days  Complete   Home Care Screening   Complete  Medication Review (RN CM)   Complete  HRI or Home Care Consult  Complete   Palliative Care Screening  Not Applicable   Medication Review (RN Care Manager) Complete Complete   PCP or Specialist appointment within 3-5 days of discharge Complete    HRI or Home Care Consult Complete    Palliative Care Screening Complete    Skilled Nursing Facility Not Applicable

## 2023-06-06 NOTE — Progress Notes (Signed)
 [facility] [phone] [facility] Liaison Note  Received request from Manya Sells, [facility] for hospice services at home after discharge.Spoke with son, Oretha Birch, by phone to initiate education related to hospice philosophy, services and team approach to care.   Per discussion, pts son [name], to arrive to pick up patient by private car to return home shortly.  DME needs discussed. [name] states that the room patient lives in is too small to accommodate a hospital bed. DME needs to be evaluated at admission visit.  Please send completed and signed DNR with patient at discharge.  Please provide prescriptions at discharge as needed to ensure ongoing symptom management.  Please call with any questions or concerns.  Thank you for the opportunity to participate in this patients care.  Lestine Rathke, BSN, [employer] [phone]

## 2023-06-06 NOTE — Progress Notes (Addendum)
   Patient Name: Colin [name] Date of Encounter: 06/06/2023 [facility] Cardiologist: Cody Das, MD   Interval Summary  .    Patient seen with assistance of [place] translator via phone. Patient reports no change in breathing, still short of breath. His primary complaint this morning is frequency of urination overnight leading to soiling of the bed.   Vital Signs .    Vitals:   06/05/23 1931 06/06/23 0006 06/06/23 0428 06/06/23 0635  BP: (!) 125/50 (!) 142/62 (!) 142/66 (!) 142/66  Pulse: 67 70 68 70  Resp: 18 18 20    Temp: 98.1 F (36.7 C) 98.1 F (36.7 C) 97.8 F (36.6 C)   TempSrc: Oral Oral Oral   SpO2: 99% 99% 98%   Weight:   64.7 kg   Height:   5\' 3"  (1.6 m)     Intake/Output Summary (Last 24 hours) at 06/06/2023 0912 Last data filed at 06/05/2023 1900 Gross per 24 hour  Intake 120 ml  Output --  Net 120 ml      06/06/2023    4:28 AM 06/05/2023   12:07 AM 06/04/2023    4:18 AM  Last 3 Weights  Weight (lbs) 142 lb 10.2 oz 147 lb 4.3 oz 143 lb 4.8 oz  Weight (kg) 64.7 kg 66.8 kg 65 kg      Telemetry/ECG    Sinus rhythm - Personally Reviewed  Physical Exam .   GEN: No acute distress.   Neck: JVP visible, 3cm above clavicle Cardiac: RRR, systolic murmur over apex.  Respiratory: Lower lobes diminished bilaterally GI: Soft, nontender, non-distended  MS: No edema  Assessment & Plan .   73 y.o. male with a hx of CAD with prior MI, LCX and RCA stents in 2012, s/p CABG X 3. LIMA LAD, RSVG PDA, OM1 (08/23/2020), chronic systolic heart failure, diabetes, HTN, HLD, CKD stage 4 (baseline SCr 2-2.5), tobacco use and GERD who is being seen for the evaluation of acute on chronic heart failure.    Acute on chronic systolic and diastolic CHF Mitral regurgitation Hypertension Patient with LVEF 30-35%, grade III DD, moderate dysfunction of RV, moderate to severe MR per January 2025 TTE. Admitted with symptoms consistent with CHF exacerbation and acute  hypertension. Also with large left pleural effusion. Diuresis has been limited by renal dysfunction, I/O net -2.4L.  Difficult situation with limited medication compliance historically, severe renal dysfunction, limited home help. Agree with palliative/hospice discussions Plan transition to PO diuretics today, Lasix  PO 80mg  daily to produce sufficient diuresis with worsened creatinine from baseline. Will discuss with Dr. Nishan. GDMT limited by CKD IV Continue Coreg  and Bidil , could titrate further to reduce BP/reduce afterload  CAD s/p CABG x3 Hyperlipidemia No chest pain this admission. Continue Plavix , Atorvastatin   Per primary team COPD DM GERD Anemia   For questions or updates, please contact [facility] Please consult [email or web address] for contact info under        Signed, Leala Prince, PA-C

## 2023-06-06 NOTE — Progress Notes (Signed)
 Occupational Therapy Treatment Patient Details Name: Colin [name] MRN: [id number] DOB: [birth year] Today's Date: 06/06/2023   History of present illness Pt is a 73 yr old male who presented 06/02/23 due to SOB and HTN. CXR showed moderate to large left pleural effusion with mild pulmonary edema. Pt s/p 06/03/23 thoracocentesis.  PMH includes: CAD s/p stent x2, DM II, NSTEMI, HTN   OT comments  Pt received in recliner with PT, agreeable to session. Pt with c/o RLE pain start of session but ambulatory throughout. While seated, pt completing self care at recliner level with set up assist. Pt requesting bathroom and able to transfer, complete hygiene, and ambulate all with supervision. He still requires significant assist for LB dressing d/t inability to bend forward/over. Pt returning to recliner and left with all needs met. Acute OT to continue to follow to address established goals to facilitate DC to next venue of care.        If plan is discharge home, recommend the following:  A little help with walking and/or transfers;A little help with bathing/dressing/bathroom;Assistance with cooking/housework;Direct supervision/assist for medications management;Direct supervision/assist for financial management;Assist for transportation;Help with stairs or ramp for entrance   Equipment Recommendations  Tub/shower bench    Recommendations for Other Services      Precautions / Restrictions Precautions Precautions: Fall Recall of Precautions/Restrictions: Intact Restrictions Weight Bearing Restrictions Per Provider Order: No       Mobility Bed Mobility               General bed mobility comments: OOB in recliner    Transfers Overall transfer level: Needs assistance Equipment used: Rolling walker (2 wheels) Transfers: Sit to/from Stand Sit to Stand: Supervision           General transfer comment: close supervision for safety     Balance Overall balance assessment: Needs  assistance Sitting-balance support: Feet supported Sitting balance-Leahy Scale: Good     Standing balance support: Bilateral upper extremity supported Standing balance-Leahy Scale: Poor Standing balance comment: due to RLE pain                           ADL either performed or assessed with clinical judgement   ADL Overall ADL's : Needs assistance/impaired     Grooming: Oral care;Wash/dry face;Wash/dry hands;Set up;Supervision/safety;Sitting Grooming Details (indicate cue type and reason): set up to supervision needed for safety and d/t pain ambulating             Lower Body Dressing: Maximal assistance;Total assistance;Sitting/lateral leans Lower Body Dressing Details (indicate cue type and reason): don socks Toilet Transfer: Contact guard assist;Supervision/safety;Cueing for sequencing;Ambulation;Rolling walker (2 wheels);Regular Teacher, adult education Details (indicate cue type and reason): ambulated to bathroom, assistance to get RW into door frame but able to lower self/power up with supervision Toileting- Clothing Manipulation and Hygiene: Supervision/safety;Sit to/from stand Toileting - Clothing Manipulation Details (indicate cue type and reason): no physical assist needed     Functional mobility during ADLs: Contact guard assist;Supervision/safety;Rolling walker (2 wheels) General ADL Comments: pt limited by RLE pain however did well ambulating to bathroom. Self care completed while seated in recliner    Extremity/Trunk Assessment              Vision       Perception     Praxis     Communication Communication Communication: No apparent difficulties Factors Affecting Communication: Non - English speaking, interpreter not available (interpreter present for session)  Cognition Arousal: Alert Behavior During Therapy: WFL for tasks assessed/performed Cognition: No apparent impairments                               Following  commands: Intact        Cueing   Cueing Techniques: Verbal cues  Exercises      Shoulder Instructions       General Comments VSS on RA    Pertinent Vitals/ Pain       Pain Assessment Pain Assessment: 0-10 Pain Score: 9  Faces Pain Scale: Hurts even more Pain Location: RLE Pain Descriptors / Indicators: Grimacing, Guarding Pain Intervention(s): Limited activity within patient's tolerance, Monitored during session  Home Living                                          Prior Functioning/Environment              Frequency  Min 2X/week        Progress Toward Goals  OT Goals(current goals can now be found in the care plan section)  Progress towards OT goals: Progressing toward goals  Acute Rehab OT Goals Patient Stated Goal: none stated OT Goal Formulation: With patient Time For Goal Achievement: 06/18/23 Potential to Achieve Goals: Good ADL Goals Pt Will Perform Lower Body Bathing: with modified independence;sitting/lateral leans;with adaptive equipment Pt Will Perform Lower Body Dressing: with modified independence;with adaptive equipment;sitting/lateral leans;sit to/from stand Pt Will Transfer to Toilet: ambulating;regular height toilet Pt Will Perform Toileting - Clothing Manipulation and hygiene: with modified independence;sit to/from stand;sitting/lateral leans Pt Will Perform Tub/Shower Transfer: with supervision;ambulating;tub bench  Plan      Co-evaluation                 AM-PAC OT "6 Clicks" Daily Activity     Outcome Measure   Help from another person eating meals?: None Help from another person taking care of personal grooming?: A Little Help from another person toileting, which includes using toliet, bedpan, or urinal?: A Little Help from another person bathing (including washing, rinsing, drying)?: A Little Help from another person to put on and taking off regular upper body clothing?: None Help from another person  to put on and taking off regular lower body clothing?: A Little 6 Click Score: 20    End of Session Equipment Utilized During Treatment: Gait belt;Rolling walker (2 wheels)  OT Visit Diagnosis: Unsteadiness on feet (R26.81);Other abnormalities of gait and mobility (R26.89);Muscle weakness (generalized) (M62.81);Pain Pain - Right/Left: Right Pain - part of body: Leg   Activity Tolerance Patient limited by pain   Patient Left in chair;with call bell/phone within reach;with chair alarm set   Nurse Communication Mobility status        Time: 5284-1324 OT Time Calculation (min): 27 min  Charges: OT General Charges $OT Visit: 1 Visit OT Treatments $Self Care/Home Management : 23-37 mins  Renae Mottley, BS, OTA/S   Zarriah Starkel 06/06/2023, 1:27 PM

## 2023-06-06 NOTE — Discharge Summary (Signed)
 Physician Discharge Summary  Colin [name] ZOX:[id number] DOB: 02/18/1950 DOA: 06/02/2023  PCP: Marius Siemens, NP  Admit date: 06/02/2023 Discharge date: 06/06/23  Admitted From: Home Disposition: Home with home hospice   Home Health: Home hospice Equipment/Devices: Hospice to arrange appropriate DME  Discharge Condition: Stable but poor prognosis CODE STATUS: DNR  Follow-up Information     [facility] Follow up.   Specialty: Home Health Services Why: Agency will call you to set up apt times Contact information: [address] [place] [address] [phone]         [facility] Follow up.   Specialty: Hospice and Palliative Medicine Why: home hospice Contact information: [address] [place]  [address] [phone]                Hospital course 73 year old M with PMH of CAD/CABG x 3, combined CHF, DM-2, CKD-4, HTN and HLD presenting with progressive shortness of breath, edema, orthopnea and lower extremity blisters, and admitted with working diagnosis of acute on chronic CHF with large left pleural effusion and hypertensive urgency likely due to noncompliance with medications.   In ED, elevated BP to 119/115.  BNP > 4500.  Troponin 21. Cr 3.2.  Albumin  <1.5.  CXR showed moderate to large left pleural effusion with mild pulmonary edema.  Patient was started on IV ceftriaxone .  IR consulted for thoracocentesis.   The next day, underwent right thoracocentesis with removal of 1.3 L.  Continued on IV Lasix .  However, his creatinine trended up and eventually plateaued around 4.0.  He was transition to p.o. Lasix .  Deemed to be not a candidate for invasive evaluation.  Cardiology recommended palliative/hospice.  Patient and family met with palliative medicine 4/30.  CODE STATUS changed to DNR.  Patient and family elected to go home with home hospice.   See individual problem list below for more.   Problems  addressed during this hospitalization Acute on chronic combined CHF: Presents with progressive dyspnea, orthopnea and edema with lower extremity blisters.  Likely due to noncompliance.  TTE 02/2023 with LVEF of 30 to 35%, GH, G2 DD, mod reduced RVF, moderate to severe MR. BNP > 4500.  CXR with large left pleural effusion and pulmonary edema.  Diuresed with IV Lasix .  Net -2.7 L.  Cr plateaued at 4.0.  Respiratory symptoms and edema improved. -Discharged on p.o. Lasix  per recommendation by cardiology -Continue home BiDil  and Coreg  -Home with home hospice. -Continue TED hose   Elevated troponin/history of CAD/CABG: No chest pain.  Troponin elevation mild and likely demand ischemia from CHF and delayed clearance from AKI.  Not a candidate for invasive evaluation. -BiDil  and Lasix  as above -Continue Plavix  and Lipitor    Uncontrolled IDDM-2 with hyperglycemia: A1c 11.6% in 02/2023 -Renewed home Lantus  prescription.   Moderate to large left pleural effusion: Prior history of this. -S/p thoracocentesis with removal of 1.3 L. -Diuretics as above.   AKI on CKD-4: Baseline Cr ranges from 2.5-3.1. Cr plateaued at 4.0. -Diuretics as above -Very poor prognosis.  Not a candidate for dialysis.   Hypertensive urgency: BP improved. -Continue home Coreg  and BiDil  -Lasix  as above   Hypothyroidism: TSH elevated but with normal free T4.  Suspect noncompliance - Continue home Synthroid    Anemia of chronic disease: Stable   Physical deconditioning   Goal of care discussion: Poor prognosis.  CODE STATUS changed to DNR.  Home with home hospice.  Time spent 35 minutes  Vital signs Vitals:   06/06/23 0006 06/06/23 0428 06/06/23 0635 06/06/23 1047  BP: (!) 142/62 (!) 142/66 (!) 142/66 (!) 154/70  Pulse: 70 68 70 70  Temp: 98.1 F (36.7 C) 97.8 F (36.6 C)  (!) 97.4 F (36.3 C)  Resp: 18 20  18   Height:  5\' 3"  (1.6 m)    Weight:  64.7 kg    SpO2: 99% 98%  96%  TempSrc: Oral Oral   Oral  BMI (Calculated):  25.27       Discharge exam  GENERAL: No apparent distress.  Nontoxic. HEENT: MMM.  Vision and hearing grossly intact.  NECK: Supple.  No apparent JVD.  RESP: On room air.  No IWOB.  Fair aeration bilaterally.  Bibasilar crackles. CVS:  RRR. Heart sounds normal.  ABD/GI/GU: BS+. Abd soft, NTND.  MSK/EXT:  Moves extremities.  TED hose in place.  1+ BLE edema. SKIN: Intact and broken bulla over right shin. NEURO: Awake and alert. Oriented appropriately.  No apparent focal neuro deficit. PSYCH: Calm. Normal affect.   Discharge Instructions Discharge Instructions     Diet - low sodium heart healthy   Complete by: As directed    Diet Carb Modified   Complete by: As directed    Discharge wound care:   Complete by: As directed    Right shin wound due to broken blister Apply Xeroform and cover with 4 x 4 gauze daily.   Increase activity slowly   Complete by: As directed       Allergies as of 06/06/2023   No Known Allergies      Medication List     STOP taking these medications    atorvastatin  80 MG tablet Commonly known as: LIPITOR    HumaLOG  KwikPen 100 UNIT/ML KwikPen Generic drug: insulin  lispro   potassium chloride  SA 20 MEQ tablet Commonly known as: KLOR-CON  M       TAKE these medications    carvedilol  6.25 MG tablet Commonly known as: COREG  Take 1 tablet (6.25 mg total) by mouth 2 (two) times daily with a meal.   clopidogrel  75 MG tablet Commonly known as: Plavix  Take 1 tablet (75 mg total) by mouth daily.   FreeStyle [place] 3 Reader [place] Use as directed!   FreeStyle Libre 3 Sensor Misc Use as instructed change every 14 days   furosemide  80 MG tablet Commonly known as: LASIX  Take 1 tablet (80 mg total) by mouth daily. Start taking on: Jun 07, 2023 What changed:  medication strength how much to take   insulin  glargine 100 UNIT/ML Solostar Pen Commonly known as: LANTUS  Inject 10 Units into the skin daily. What changed:   how much to take when to take this   isosorbide -hydrALAZINE  20-37.5 MG tablet Commonly known as: BIDIL  Take 1 tablet by mouth 3 (three) times daily.   levothyroxine  25 MCG tablet Commonly known as: SYNTHROID  Take 1 tablet (25 mcg total) by mouth daily at 6 (six) AM.   nitroGLYCERIN  0.4 MG SL tablet Commonly known as: NITROSTAT  Place 1 tab (0.4mg  total) under the tongue every 5 minutes as needed for chest pain   TechLite Pen Needles 31G X 5 MM Misc Generic drug: Insulin  Pen Needle Uses three times daily with insulin .   Insupen Pen Needles 32G X 4 MM Misc Generic drug: Insulin  Pen Needle Use as directed with Humalog  and Lantus  pens.               Discharge Care Instructions  (  From admission, onward)           Start     Ordered   06/06/23 0000  Discharge wound care:       Comments: Right shin wound due to broken blister Apply Xeroform and cover with 4 x 4 gauze daily.   06/06/23 1239            Consultations: Cardiology Palliative medicine  Procedures/Studies:   [facility] Chest Port 1 View Result Date: 06/04/2023 CLINICAL DATA:  Shortness of breath. EXAM: PORTABLE CHEST 1 VIEW COMPARISON:  Chest radiograph dated 06/03/2023. FINDINGS: Bilateral pleural effusions, left greater than right. There is associated bibasilar compressive atelectasis versus pneumonia. No pneumothorax. Stable cardiac silhouette. Median sternotomy wires. No acute osseous pathology. IMPRESSION: Bilateral pleural effusions with bibasilar atelectasis or infiltrate. Electronically Signed   By: Angus Bark M.D.   On: 06/04/2023 14:50   IR THORACENTESIS ASP PLEURAL SPACE W/IMG GUIDE Result Date: 06/03/2023 INDICATION: 73 year old male with acute on chronic heart failure, presenting to ED with shortness of breath and volume overload, with bilateral pleural effusion, left greater than right. IR was requested for left-sided therapeutic thoracentesis. EXAM: ULTRASOUND GUIDED THERAPEUTIC  THORACENTESIS MEDICATIONS: 6 cc of 1% lidocaine  COMPLICATIONS: None immediate. PROCEDURE: An ultrasound guided thoracentesis was thoroughly discussed with the patient and questions answered. The benefits, risks, alternatives and complications were also discussed. The patient understands and wishes to proceed with the procedure. Written consent was obtained. Ultrasound was performed to localize and mark an adequate pocket of fluid in the left chest. The area was then prepped and draped in the normal sterile fashion. 1% Lidocaine  was used for local anesthesia. Under ultrasound guidance a 6 Fr Safe-T-Centesis catheter was introduced. Thoracentesis was performed. The catheter was removed and a dressing applied. FINDINGS: A total of approximately 1.3 L of clear, nearly colorless pleural fluid was removed. IMPRESSION: Successful ultrasound guided left thoracentesis yielding 1.3 of pleural fluid. Procedure performed by Lambert Pillion, PA-C Electronically Signed   By: Nicoletta Barrier M.D.   On: 06/03/2023 12:04   DG Chest 1 View Result Date: 06/03/2023 CLINICAL DATA:  Post thoracentesis on the left. EXAM: CHEST  1 VIEW COMPARISON:  Radiographs 06/02/2023 and 05/15/2023. FINDINGS: 1025 hours. Interval significant improvement in the previously demonstrated left pleural effusion. There are small residual pleural effusions bilaterally with mild atelectasis at both lung bases. No confluent airspace disease or pneumothorax. The heart size and mediastinal contours are stable post median sternotomy and CABG. No acute osseous findings are evident. IMPRESSION: Interval significant improvement in left pleural effusion post thoracentesis. No pneumothorax. Electronically Signed   By: Elmon Hagedorn M.D.   On: 06/03/2023 11:28   DG Chest Portable 1 View Result Date: 06/02/2023 CLINICAL DATA:  CHF and shortness of breath. EXAM: PORTABLE CHEST 1 VIEW COMPARISON:  05/15/2023 FINDINGS: Previous median sternotomy and CABG procedure. Aortic  atherosclerosis. Moderate to large left pleural effusion is unchanged from prior exam. New small right pleural effusion with veil like opacification over the right lower lung. Mildly increased interstitial markings. IMPRESSION: 1. New small right pleural effusion. 2. Unchanged moderate to large left pleural effusion. 3. Mildly increased interstitial markings compatible with pulmonary edema. Electronically Signed   By: Kimberley Penman M.D.   On: 06/02/2023 07:41   DG Chest 2 View Result Date: 05/15/2023 CLINICAL DATA:  COPD.  Shortness of breath EXAM: CHEST - 2 VIEW COMPARISON:  X-ray 02/27/2023 FINDINGS: Sternal wires. Enlarged cardiopericardial silhouette. Stable moderate left effusion. Tiny right. No pneumothorax or  edema. Calcified aorta. IMPRESSION: Postop chest.  Persistent moderate left effusion.  Tiny right. Electronically Signed   By: Adrianna Horde M.D.   On: 05/15/2023 12:14       The results of significant diagnostics from this hospitalization (including imaging, microbiology, ancillary and laboratory) are listed below for reference.     Microbiology: No results found for this or any previous visit (from the past 240 hours).   Labs:  CBC: Recent Labs  Lab 06/02/23 0700 06/04/23 0302 06/05/23 0257 06/06/23 0259  WBC 7.0 6.0 5.2 5.2  HGB 11.6* 10.1* 10.3* 9.7*  HCT 37.7* 31.9* 32.9* 31.1*  MCV 96.2 94.7 94.5 95.1  PLT 246 210 224 219   BMP &GFR Recent Labs  Lab 06/02/23 0858 06/03/23 0259 06/04/23 0302 06/05/23 0257 06/06/23 0259  NA 139 143 139 140 141  K 4.3 4.6 4.6 4.2 4.4  CL 109 109 104 105 106  CO2 24 26 25 28 27   GLUCOSE 346* 113* 103* 104* 73  BUN 42* 46* 55* 59* 60*  CREATININE 3.20* 3.55* 4.00* 4.00* 4.16*  CALCIUM  7.8* 8.0* 7.9* 7.9* 8.1*  MG  --   --  2.2 2.1 2.2  PHOS  --   --   --   --  5.1*   Estimated Creatinine Clearance: 12.7 mL/min (A) (by C-G formula based on SCr of 4.16 mg/dL (H)). Liver & Pancreas: Recent Labs  Lab 06/02/23 0858  06/06/23 0259  AST 21  --   ALT 16  --   ALKPHOS 66  --   BILITOT 0.2  --   PROT 5.1*  --   ALBUMIN  <1.5* <1.5*   No results for input(s): "LIPASE", "AMYLASE" in the last 168 hours. No results for input(s): "AMMONIA" in the last 168 hours. Diabetic: Recent Labs    06/05/23 0257  HGBA1C 12.8*   Recent Labs  Lab 06/05/23 1549 06/05/23 2117 06/06/23 0609 06/06/23 0637 06/06/23 1221  GLUCAP 197* 203* 65* 94 191*   Cardiac Enzymes: No results for input(s): "CKTOTAL", "CKMB", "CKMBINDEX", "TROPONINI" in the last 168 hours. No results for input(s): "PROBNP" in the last 8760 hours. Coagulation Profile: No results for input(s): "INR", "PROTIME" in the last 168 hours. Thyroid  Function Tests: No results for input(s): "TSH", "T4TOTAL", "FREET4", "T3FREE", "THYROIDAB" in the last 72 hours. Lipid Profile: No results for input(s): "CHOL", "HDL", "LDLCALC", "TRIG", "CHOLHDL", "LDLDIRECT" in the last 72 hours. Anemia Panel: No results for input(s): "VITAMINB12", "FOLATE", "FERRITIN", "TIBC", "IRON", "RETICCTPCT" in the last 72 hours. Urine analysis:    Component Value Date/Time   COLORURINE YELLOW 06/02/2023 0858   APPEARANCEUR CLEAR 06/02/2023 0858   LABSPEC 1.025 06/02/2023 0858   PHURINE 7.0 06/02/2023 0858   GLUCOSEU >=500 (A) 06/02/2023 0858   HGBUR NEGATIVE 06/02/2023 0858   BILIRUBINUR NEGATIVE 06/02/2023 0858   BILIRUBINUR negative 05/06/2020 1113   KETONESUR NEGATIVE 06/02/2023 0858   PROTEINUR >=300 (A) 06/02/2023 0858   UROBILINOGEN 0.2 05/06/2020 1113   UROBILINOGEN 0.2 01/21/2010 1330   NITRITE NEGATIVE 06/02/2023 0858   LEUKOCYTESUR NEGATIVE 06/02/2023 0858   Sepsis Labs: Invalid input(s): "PROCALCITONIN", "LACTICIDVEN"   SIGNED:  Theadore Finger, MD  [facility] 06/06/2023, 12:39 PM

## 2023-06-06 NOTE — Progress Notes (Signed)
 Patient blood sugar at 0609H is 65mg /dl, seen patient awake, denies dizziness, headache, assisted patient in drinking juice. Rechecked CBG after 30 minutes 94mg /dl. Patient no active complaint. Kept safe and comfortable on bed.

## 2023-06-06 NOTE — Progress Notes (Signed)
 Nursing Discharge Note   Name: Colin [name] MRN: [id number] DOB: February 07, 1950    Admit Date:  06/02/2023  Discharge Date:  06/06/2023   Colin [name] is to be discharged home with Home Hospice  per MD order.  AVS completed. Reviewed with patient and family at bedside. Highlighted copy provided for patient to take home.  Patient/caregiver able to verbalize understanding of discharge instructions. PIV removed. Patient stable upon discharge.  Son to come pick up patient. [facility] liaison came to bedside prior to discharge.   Discharge Instructions   None      Discharge Instructions     Diet - low sodium heart healthy   Complete by: As directed    Diet Carb Modified   Complete by: As directed    Discharge wound care:   Complete by: As directed    Right shin wound due to broken blister Apply Xeroform and cover with 4 x 4 gauze daily.   Increase activity slowly   Complete by: As directed         Allergies as of 06/06/2023   No Known Allergies      Medication List     STOP taking these medications    atorvastatin  80 MG tablet Commonly known as: LIPITOR    HumaLOG  KwikPen 100 UNIT/ML KwikPen Generic drug: insulin  lispro   potassium chloride  SA 20 MEQ tablet Commonly known as: KLOR-CON  M       TAKE these medications    acetaminophen  500 MG tablet Commonly known as: TYLENOL  Take 2 tablets (1,000 mg total) by mouth every 8 (eight) hours as needed for up to 10 days for mild pain (pain score 1-3), moderate pain (pain score 4-6) or headache.   carvedilol  6.25 MG tablet Commonly known as: COREG  Take 1 tablet (6.25 mg total) by mouth 2 (two) times daily with a meal.   clopidogrel  75 MG tablet Commonly known as: Plavix  Take 1 tablet (75 mg total) by mouth daily.   FreeStyle [place] 3 Reader [place] Use as directed!   FreeStyle Libre 3 Sensor Misc Use as instructed change every 14 days   furosemide  80 MG tablet Commonly known as: LASIX  Take 1 tablet (80  mg total) by mouth daily. Start taking on: Jun 07, 2023 What changed:  medication strength how much to take   insulin  glargine 100 UNIT/ML Solostar Pen Commonly known as: LANTUS  Inject 10 Units into the skin daily. What changed:  how much to take when to take this   isosorbide -hydrALAZINE  20-37.5 MG tablet Commonly known as: BIDIL  Take 1 tablet by mouth 3 (three) times daily.   levothyroxine  25 MCG tablet Commonly known as: SYNTHROID  Take 1 tablet (25 mcg total) by mouth daily at 6 (six) AM.   nitroGLYCERIN  0.4 MG SL tablet Commonly known as: NITROSTAT  Place 1 tab (0.4mg  total) under the tongue every 5 minutes as needed for chest pain   TechLite Pen Needles 31G X 5 MM Misc Generic drug: Insulin  Pen Needle Uses three times daily with insulin .   Insupen Pen Needles 32G X 4 MM Misc Generic drug: Insulin  Pen Needle Use as directed with Humalog  and Lantus  pens.               Discharge Care Instructions  (From admission, onward)           Start     Ordered   06/06/23 0000  Discharge wound care:       Comments: Right shin wound due to broken  blister Apply Xeroform and cover with 4 x 4 gauze daily.   06/06/23 1239             Discharge Instructions/ Education: An After Visit Summary was printed and given to the patient. Discharge instructions given to patient/family with verbalized understanding..  Patient instructed to return to Emergency Department, call 911, or call MD for any changes in condition.   Patient escorted via wheelchair to lobby and discharged home via private automobile.

## 2023-06-06 NOTE — Care Management Important Message (Signed)
 Important Message  Patient Details  Name: Colin [name] MRN: [id number] Date of Birth: [birth year]   Important Message Given:  Yes - Medicare IM     Janith Melnick 06/06/2023, 10:56 AM

## 2023-06-06 NOTE — Plan of Care (Signed)
 Problem: Education: Goal: Ability to describe self-care measures that may prevent or decrease complications (Diabetes Survival Skills Education) will improve 06/06/2023 1324 by Delmus Ferri, RN Outcome: Adequate for Discharge 06/06/2023 1324 by Delmus Ferri, RN Outcome: Adequate for Discharge Goal: Individualized Educational Video(s) 06/06/2023 1324 by Delmus Ferri, RN Outcome: Adequate for Discharge 06/06/2023 1324 by Delmus Ferri, RN Outcome: Adequate for Discharge   Problem: Coping: Goal: Ability to adjust to condition or change in health will improve 06/06/2023 1324 by Delmus Ferri, RN Outcome: Adequate for Discharge 06/06/2023 1324 by Delmus Ferri, RN Outcome: Adequate for Discharge   Problem: Fluid Volume: Goal: Ability to maintain a balanced intake and output will improve 06/06/2023 1324 by Delmus Ferri, RN Outcome: Adequate for Discharge 06/06/2023 1324 by Delmus Ferri, RN Outcome: Adequate for Discharge   Problem: Health Behavior/Discharge Planning: Goal: Ability to identify and utilize available resources and services will improve 06/06/2023 1324 by Delmus Ferri, RN Outcome: Adequate for Discharge 06/06/2023 1324 by Delmus Ferri, RN Outcome: Adequate for Discharge Goal: Ability to manage health-related needs will improve 06/06/2023 1324 by Delmus Ferri, RN Outcome: Adequate for Discharge 06/06/2023 1324 by Delmus Ferri, RN Outcome: Adequate for Discharge   Problem: Metabolic: Goal: Ability to maintain appropriate glucose levels will improve 06/06/2023 1324 by Delmus Ferri, RN Outcome: Adequate for Discharge 06/06/2023 1324 by Delmus Ferri, RN Outcome: Adequate for Discharge   Problem: Nutritional: Goal: Maintenance of adequate nutrition will improve 06/06/2023 1324 by Delmus Ferri, RN Outcome: Adequate for Discharge 06/06/2023 1324 by Delmus Ferri, RN Outcome: Adequate for Discharge Goal: Progress toward achieving an optimal weight will improve 06/06/2023 1324 by Delmus Ferri, RN Outcome: Adequate for Discharge 06/06/2023 1324 by Delmus Ferri, RN Outcome: Adequate for Discharge   Problem: Skin Integrity: Goal: Risk for impaired skin integrity will decrease 06/06/2023 1324 by Delmus Ferri, RN Outcome: Adequate for Discharge 06/06/2023 1324 by Delmus Ferri, RN Outcome: Adequate for Discharge   Problem: Tissue Perfusion: Goal: Adequacy of tissue perfusion will improve 06/06/2023 1324 by Delmus Ferri, RN Outcome: Adequate for Discharge 06/06/2023 1324 by Delmus Ferri, RN Outcome: Adequate for Discharge   Problem: Education: Goal: Knowledge of General Education information will improve Description: Including pain rating scale, medication(s)/side effects and non-pharmacologic comfort measures 06/06/2023 1324 by Delmus Ferri, RN Outcome: Adequate for Discharge 06/06/2023 1324 by Delmus Ferri, RN Outcome: Adequate for Discharge   Problem: Health Behavior/Discharge Planning: Goal: Ability to manage health-related needs will improve 06/06/2023 1324 by Delmus Ferri, RN Outcome: Adequate for Discharge 06/06/2023 1324 by Delmus Ferri, RN Outcome: Adequate for Discharge   Problem: Clinical Measurements: Goal: Ability to maintain clinical measurements within normal limits will improve 06/06/2023 1324 by Delmus Ferri, RN Outcome: Adequate for Discharge 06/06/2023 1324 by Delmus Ferri, RN Outcome: Adequate for Discharge Goal: Will remain free from infection 06/06/2023 1324 by Delmus Ferri, RN Outcome: Adequate for Discharge 06/06/2023 1324 by Delmus Ferri, RN Outcome: Adequate for Discharge Goal: Diagnostic test results will improve 06/06/2023 1324 by Delmus Ferri, RN Outcome: Adequate for Discharge 06/06/2023 1324 by Delmus Ferri, RN Outcome: Adequate for Discharge Goal: Respiratory complications will improve 06/06/2023 1324 by Delmus Ferri, RN Outcome: Adequate for Discharge 06/06/2023 1324 by Delmus Ferri, RN Outcome: Adequate for Discharge Goal:  Cardiovascular complication will be avoided 06/06/2023 1324 by Delmus Ferri, RN Outcome:  Adequate for Discharge 06/06/2023 1324 by Delmus Ferri, RN Outcome: Adequate for Discharge   Problem: Activity: Goal: Risk for activity intolerance will decrease 06/06/2023 1324 by Delmus Ferri, RN Outcome: Adequate for Discharge 06/06/2023 1324 by Delmus Ferri, RN Outcome: Adequate for Discharge   Problem: Nutrition: Goal: Adequate nutrition will be maintained 06/06/2023 1324 by Delmus Ferri, RN Outcome: Adequate for Discharge 06/06/2023 1324 by Delmus Ferri, RN Outcome: Adequate for Discharge   Problem: Coping: Goal: Level of anxiety will decrease 06/06/2023 1324 by Delmus Ferri, RN Outcome: Adequate for Discharge 06/06/2023 1324 by Delmus Ferri, RN Outcome: Adequate for Discharge   Problem: Elimination: Goal: Will not experience complications related to bowel motility 06/06/2023 1324 by Delmus Ferri, RN Outcome: Adequate for Discharge 06/06/2023 1324 by Delmus Ferri, RN Outcome: Adequate for Discharge Goal: Will not experience complications related to urinary retention 06/06/2023 1324 by Delmus Ferri, RN Outcome: Adequate for Discharge 06/06/2023 1324 by Delmus Ferri, RN Outcome: Adequate for Discharge   Problem: Pain Managment: Goal: General experience of comfort will improve and/or be controlled 06/06/2023 1324 by Delmus Ferri, RN Outcome: Adequate for Discharge 06/06/2023 1324 by Delmus Ferri, RN Outcome: Adequate for Discharge   Problem: Safety: Goal: Ability to remain free from injury will improve 06/06/2023 1324 by Delmus Ferri, RN Outcome: Adequate for Discharge 06/06/2023 1324 by Delmus Ferri, RN Outcome: Adequate for Discharge   Problem: Skin Integrity: Goal: Risk for impaired skin integrity will decrease 06/06/2023 1324 by Delmus Ferri, RN Outcome: Adequate for Discharge 06/06/2023 1324 by Delmus Ferri, RN Outcome: Adequate for Discharge   Problem: Acute Rehab OT Goals (only  OT should resolve) Goal: Pt. Will Perform Lower Body Bathing Outcome: Adequate for Discharge Goal: Pt. Will Perform Lower Body Dressing Outcome: Adequate for Discharge Goal: Pt. Will Transfer To Toilet Outcome: Adequate for Discharge Goal: Pt. Will Perform Toileting-Clothing Manipulation Outcome: Adequate for Discharge Goal: Pt. Will Perform Tub/Shower Transfer Outcome: Adequate for Discharge   Problem: Acute Rehab PT Goals(only PT should resolve) Goal: Patient Will Transfer Sit To/From Stand Outcome: Adequate for Discharge Goal: Pt Will Transfer Bed To Chair/Chair To Bed Outcome: Adequate for Discharge Goal: Pt Will Ambulate Outcome: Adequate for Discharge Goal: Pt Will Go Up/Down Stairs Outcome: Adequate for Discharge

## 2023-06-06 NOTE — TOC Progression Note (Addendum)
 Transition of Care [facility]) - Progression Note    Patient Details  Name: HONG NIEWINSKI MRN: [id number] Date of Birth: April 26, 1950  Transition of Care [facility]) CM/SW Contact  Jennett Model, RN Phone Number: 06/06/2023, 12:24 PM  Clinical Narrative:    Patient spoke to NCM in English, NCM received consult for home hospice, NCM offered choice to patient, he said to call his son [place],  NCM contacted [facility], he chose [facility].  NCM made referral to [facility] with [facility].  [name] states he may need a hospital bed but the bed does not need to be at the home in order for him to be there.  NCM notified Randel Buss with [facility] that patient is going home with hospice.        Expected Discharge Plan and Services                                               Social Determinants of Health (SDOH) Interventions SDOH Screenings   Food Insecurity: No Food Insecurity (06/03/2023)  Housing: Low Risk  (06/03/2023)  Transportation Needs: No Transportation Needs (06/03/2023)  Utilities: Not At Risk (06/03/2023)  Depression (PHQ2-9): High Risk (01/11/2022)  Social Connections: Moderately Integrated (06/03/2023)  Tobacco Use: Medium Risk (06/02/2023)    Readmission Risk Interventions    06/03/2023    4:32 PM 01/01/2023   12:57 PM 12/07/2022    4:35 PM  Readmission Risk Prevention Plan  Transportation Screening Complete Complete Complete  PCP or Specialist Appt within 5-7 Days   Complete  PCP or Specialist Appt within 3-5 Days  Complete   Home Care Screening   Complete  Medication Review (RN CM)   Complete  HRI or Home Care Consult  Complete   Palliative Care Screening  Not Applicable   Medication Review (RN Care Manager) Complete Complete   PCP or Specialist appointment within 3-5 days of discharge Complete    HRI or Home Care Consult Complete    Palliative Care Screening Complete    Skilled Nursing Facility Not Applicable

## 2023-06-07 ENCOUNTER — Other Ambulatory Visit (HOSPITAL_COMMUNITY): Payer: Self-pay

## 2023-06-07 ENCOUNTER — Telehealth: Payer: Self-pay

## 2023-06-07 MED ORDER — INSULIN PEN NEEDLE 32G X 4 MM MISC
0 refills | Status: AC
  Filled 2023-06-07: qty 100, 30d supply, fill #0

## 2023-06-07 NOTE — Transitions of Care (Post Inpatient/ED Visit) (Signed)
   06/07/2023  Name: CAITLYN KLECKNER MRN: [id number] DOB: [birth year]  Today's TOC FU Call Status: Today's TOC FU Call Status:: Successful TOC FU Call Completed TOC FU Call Complete Date: 06/07/23 Patient's Name and Date of Birth confirmed.  Transition Care Management Follow-up Telephone Call Date of Discharge: 06/06/23 Discharge Facility: [facility]) Type of Discharge: Inpatient Admission Primary Inpatient Discharge Diagnosis:: Acute on chronic heart failure   Placed call to [facility] and confirmed patient is active and on hospice services.    Orpha Blade, RN, BSN, CEN [employer]- Transition of Care Team.  [facility] [phone]

## 2023-06-20 NOTE — Progress Notes (Deleted)
 Cardiology Office Note    Patient Name: Colin [name] Date of Encounter: 06/20/2023  Primary Care Provider:  Marius Siemens, NP Primary Cardiologist:  Colin Das, MD Primary Electrophysiologist: None   Past Medical History    Past Medical History:  Diagnosis Date   Acute hypoxic respiratory failure (HCC) 12/16/2021   Acute pulmonary edema (HCC)    Arrhythmia    CAD (coronary artery disease)    a. s/p stent x 2; b. 08/2020 CABG x 3 (LIMA->LAD, VG->PDA, VG->OM1).   Carotid artery occlusion    Chronic combined systolic and diastolic heart failure (HCC)    Chronic HFrEF (heart failure with reduced ejection fraction) (HCC)    a. 11/2022 Echo: EF 25-30%, glob HK, GrI DD, nl RV fxn, mod dil LA, mild-mod MR.   Diabetes mellitus    Dyslipidemia    Essential hypertension    Ischemic cardiomyopathy    a. 11/2022 Echo: EF 25-30%.   Left upper quadrant abdominal pain 05/30/2022   NSTEMI (non-ST elevated myocardial infarction) [facility])     History of Present Illness  Colin [name] is a 73 y.o. male with a PMH of CAD s/p STEMI with PCI 2012 and NSTEMI 2022 treated with CABG x 3 (LIMA to LAD and VG to PDA and VG to OM1 DM type II, HLD, carotid stenosis (R ICA 1-39% and left ICA 40-59%), CKD stage IV HTN, HFrEF, ICM who presents today for posthospital follow-up.  [name] was seen initially in 2012 for NSTEMI and treated with PCI to RCA with overlapping stent.  He presented back to the ED on 08/20/2020 with complaint of chest pain and found to have NSTEMI with CTA of the chest completed showing severe atherosclerosis in thoracic and abdominal aorta with multiple ulcerated plaques and negative for dissection.  Chest x-ray showed pulmonary edema and patient was diuresed with IV Lasix  placed on heparin  and nitro drip.  2D echo was completed showing EF of 30-35% with reduced RV and WMA.  He underwent LHC that showed multivessel disease and Temp ppm was placed due to symptomatic sinus pauses.  CT  surgery was consulted and advisement was made for CHF team to evaluate.  He was seen by Dr. Mitzie [name] on 08/22/2020 volume status was stable and Imdur  was added to treatment plan.  Underwent pre-CABG ultrasound that showed 1-39% stenosis in right ICA with 40-59% stenosis in left ICA.  Patient underwent CABG x 3 by Dr. Deloise [name] utilizing (LIMA-LAD, SVG-OM, SVG-PDA).  He not have any significant complications postsurgery.  He was seen by Dr. Filiberto [name] on 11/11/2020 for post CABG follow-up.  He was seen in the ED on 12/16/2021 with complaint of chest pain and dyspnea on exertion.  He was treated with Lasix  drip and 2D echo was repeated showing EF of 30-35% with global hypokinesis and RV systolic function with mild reduction in LA dilation with moderate to severe MR.  He underwent a RHC that showed mild PAH and normal filling pressures with WHO group 2 recommendations to hold diuretics the rising creatinine.  He presented back to the ED on 01/22/2022 with complaint of weakness.  He reported decreased intake for 2 to 3 days with continued use of regular insulin .  CHF medication were adjusted patient was started on metoprolol  100 mg daily for uncontrolled HTN.  He had significant bradycardia with heart rate in the 40s and patient had metoprolol  discontinued in the setting of ivabradine .  He was seen in follow-up on 02/06/2022 with elevated BP.  During visit blood pressures were very labile and medication reconciliation was needed due to family's confusion with med regimen.  He presented back to the office on 02/14/2022 with his son and reported exertional dyspnea along with lower extremity swelling.  He had torsemide  changed back to Lasix  and BiDil  was discontinued.  He was seen for 2-week follow-up and remained volume overloaded.  He was unable to add additional GDMT in the setting of renal dysfunction.  He was seen in the ED on 11/23/2022 with complaint of shortness of breath with orthopnea and bilateral lower extremity  edema.  BNP was 1693 with elevated troponins chest x-ray showing bilateral vascular congestion with pleural effusions.  He was placed on IV Lasix  and admitted with evaluation by advanced heart failure team and 6.3 L negative fluid deficit.  He was seen in follow-up on 12/06/2022 by Dr. Alease [name] reported feeling very poorly with worsened appetite.  He was transferred to the emergency room due to decompensation.  Chest x-ray showed cardiomegaly EG showed no acute changes.  He was treated with diuresis and discharged with p.o. Lasix .  Completed showing EF of 25 to 30%.  He was seen back in the ED 12/30/2022 r with shortness of breath elevated troponins.  He was consulted by cardiology services for CHF management.  Patient's family reported that he was being noncompliant with his medications.  He was noted to be hypoxic on arrival with worsening shortness of breath with saturation of 70%. BNP was significantly elevated 2342, troponin elevated 1392 > 1322. Chest x-ray showed evidence of CHF he was treated with IV Lasix  and respiratory failure resolved.  He was readmitted on 02/25/2023 with decompensated CHF in the setting of noncompliance after running out of medications.  He reported being too weak to stand and chest x-ray showed bilateral pleural effusions left greater than right with airspace disease.  He was treated with IV Lasix  80 mg twice daily with good diuresis.  He underwent thoracentesis with 700 mL of fluid drained and was started on BiDil  as well as Lasix  increased to 60 mg.  TTE was completed showing EF of 30-35% with global hypokinesis moderate reduced LAE with moderate dilation and moderate to severe MR.  He was seen in follow-up on 03/15/2023 by Dr. Alease [name] with improvement to his failure to thrive related to volume status.  He was continued on Lasix  60 mg daily.  He was advised to hold diuretics due to poor p.o. intake.  He was admitted most recently on 06/02/2023 with increased shortness of breath x 3 days.   Noted to have worsening lower extremity swelling up into his abdomen.  Review of records show that Lasix  was not filled 30-day supply on 4/9 and all other medications were not filled since last hospitalization.  He was noted to have elevated BP at 199/115 with BNP greater than 4500 and chest x-ray noted for small right pleural effusion with moderate to large pleural effusion with mild pulmonary edema.  He had thoracentesis completed with relief of breathing and total of 0.3 L of clear colorless pleural fluid.  BP was noted to be low and Coreg  was held and IV diuresis continued at 80 mg twice daily.  Cardiology recommended palliative/hospice.  He was consulted by palliative medicine but unable to complete due to confusion from patient and barrier to language.  Family met with palliative medicine on/30/25 and CODE STATUS changed to DNR.  Family elected to send patient home with hospice.    patient denies chest pain,  palpitations, dyspnea, PND, orthopnea, nausea, vomiting, dizziness, syncope, edema, weight gain, or early satiety.   Discussed the use of AI scribe software for clinical note transcription with the patient, who gave verbal consent to proceed.  History of Present Illness    ***Notes:   Review of Systems  Please see the history of present illness.    All other systems reviewed and are otherwise negative except as noted above.  Physical Exam     Wt Readings from Last 3 Encounters:  06/06/23 142 lb 10.2 oz (64.7 kg)  03/15/23 125 lb 9.6 oz (57 kg)  03/02/23 129 lb 6.6 oz (58.7 kg)   ZO:[id number] were no vitals filed for this visit.,There is no height or weight on file to calculate BMI. GEN: Well nourished, well developed in no acute distress Neck: No JVD; No carotid bruits Pulmonary: Clear to auscultation without rales, wheezing or rhonchi  Cardiovascular: Normal rate. Regular rhythm. Normal S1. Normal S2.   Murmurs: There is no murmur.  ABDOMEN: Soft, non-tender,  non-distended EXTREMITIES:  No edema; No deformity   EKG/LABS/ Recent Cardiac Studies   ECG personally reviewed by me today - ***  Risk Assessment/Calculations:   {Does this patient have ATRIAL FIBRILLATION?:[phone]}      Lab Results  Component Value Date   WBC 5.2 06/06/2023   HGB 9.7 (L) 06/06/2023   HCT 31.1 (L) 06/06/2023   MCV 95.1 06/06/2023   PLT 219 06/06/2023   Lab Results  Component Value Date   CREATININE 4.16 (H) 06/06/2023   BUN 60 (H) 06/06/2023   NA 141 06/06/2023   K 4.4 06/06/2023   CL 106 06/06/2023   CO2 27 06/06/2023   Lab Results  Component Value Date   CHOL 384 (H) 11/27/2022   HDL 44 11/27/2022   LDLCALC 323 (H) 11/27/2022   LDLDIRECT 214.0 06/15/2022   TRIG 86 11/27/2022   CHOLHDL 8.7 11/27/2022    Lab Results  Component Value Date   HGBA1C 12.8 (H) 06/05/2023   Assessment & Plan    Assessment and Plan Assessment & Plan     1.  Chronic combined CHF  2.  Coronary artery disease  3.  Essential hypertension  4.  CKD stage IV  5.  DM type II      Disposition: Follow-up with Colin Das, MD or APP in *** months {Are you ordering a CV Procedure (e.g. stress test, cath, DCCV, TEE, etc)?   Press F2        :[id number]}   Signed, Francene Ing, Retha Cast, NP 06/20/2023, 12:58 PM [facility]

## 2023-06-21 ENCOUNTER — Ambulatory Visit: Attending: Nurse Practitioner | Admitting: Nurse Practitioner

## 2023-06-21 DIAGNOSIS — I10 Essential (primary) hypertension: Secondary | ICD-10-CM

## 2023-06-21 DIAGNOSIS — N184 Chronic kidney disease, stage 4 (severe): Secondary | ICD-10-CM

## 2023-06-21 DIAGNOSIS — E1165 Type 2 diabetes mellitus with hyperglycemia: Secondary | ICD-10-CM

## 2023-06-21 DIAGNOSIS — I5023 Acute on chronic systolic (congestive) heart failure: Secondary | ICD-10-CM

## 2023-06-21 DIAGNOSIS — I2510 Atherosclerotic heart disease of native coronary artery without angina pectoris: Secondary | ICD-10-CM
